# Patient Record
Sex: Male | Born: 1965
Health system: Southern US, Community
[De-identification: ages and names within clinical notes are randomized; demographics above are authoritative.]

## PROBLEM LIST (undated history)

## (undated) ENCOUNTER — Encounter

## (undated) ENCOUNTER — Encounter: Attending: Nephrology | Primary: Nephrology

## (undated) ENCOUNTER — Ambulatory Visit

## (undated) ENCOUNTER — Telehealth

## (undated) ENCOUNTER — Ambulatory Visit: Payer: MEDICARE

## (undated) ENCOUNTER — Encounter
Attending: Student in an Organized Health Care Education/Training Program | Primary: Student in an Organized Health Care Education/Training Program

## (undated) ENCOUNTER — Encounter: Attending: Surgery | Primary: Surgery

## (undated) ENCOUNTER — Ambulatory Visit: Attending: Nephrology | Primary: Nephrology

## (undated) ENCOUNTER — Encounter: Attending: Ambulatory Care | Primary: Ambulatory Care

## (undated) ENCOUNTER — Ambulatory Visit: Payer: MEDICARE | Attending: Surgery | Primary: Surgery

## (undated) ENCOUNTER — Ambulatory Visit: Payer: MEDICAID

## (undated) ENCOUNTER — Telehealth: Attending: Registered" | Primary: Registered"

## (undated) ENCOUNTER — Telehealth
Attending: Pharmacist Clinician (PhC)/ Clinical Pharmacy Specialist | Primary: Pharmacist Clinician (PhC)/ Clinical Pharmacy Specialist

## (undated) ENCOUNTER — Encounter: Payer: MEDICARE | Attending: Nephrology | Primary: Nephrology

## (undated) DIAGNOSIS — I10 Essential (primary) hypertension: Secondary | ICD-10-CM

## (undated) DIAGNOSIS — N186 End stage renal disease: Secondary | ICD-10-CM

## (undated) DIAGNOSIS — D649 Anemia, unspecified: Secondary | ICD-10-CM

## (undated) DIAGNOSIS — G4730 Sleep apnea, unspecified: Secondary | ICD-10-CM

## (undated) DIAGNOSIS — M1990 Unspecified osteoarthritis, unspecified site: Secondary | ICD-10-CM

## (undated) DIAGNOSIS — E119 Type 2 diabetes mellitus without complications: Secondary | ICD-10-CM

## (undated) DIAGNOSIS — R112 Nausea with vomiting, unspecified: Secondary | ICD-10-CM

## (undated) DIAGNOSIS — N189 Chronic kidney disease, unspecified: Secondary | ICD-10-CM

## (undated) DIAGNOSIS — N289 Disorder of kidney and ureter, unspecified: Secondary | ICD-10-CM

## (undated) DIAGNOSIS — Z9889 Other specified postprocedural states: Secondary | ICD-10-CM

## (undated) DIAGNOSIS — I2510 Atherosclerotic heart disease of native coronary artery without angina pectoris: Secondary | ICD-10-CM

## (undated) DIAGNOSIS — E079 Disorder of thyroid, unspecified: Secondary | ICD-10-CM

## (undated) DIAGNOSIS — C801 Malignant (primary) neoplasm, unspecified: Secondary | ICD-10-CM

## (undated) DIAGNOSIS — Z89519 Acquired absence of unspecified leg below knee: Secondary | ICD-10-CM

## (undated) DIAGNOSIS — N2889 Other specified disorders of kidney and ureter: Secondary | ICD-10-CM

## (undated) DIAGNOSIS — M86679 Other chronic osteomyelitis, unspecified ankle and foot: Secondary | ICD-10-CM

## (undated) DIAGNOSIS — E669 Obesity, unspecified: Secondary | ICD-10-CM

## (undated) DIAGNOSIS — E118 Type 2 diabetes mellitus with unspecified complications: Secondary | ICD-10-CM

## (undated) DIAGNOSIS — G629 Polyneuropathy, unspecified: Secondary | ICD-10-CM

## (undated) HISTORY — DX: Chronic kidney disease, unspecified: N18.9

## (undated) HISTORY — PX: FOOT SURGERY: SHX648

## (undated) HISTORY — PX: BELOW KNEE LEG AMPUTATION: SUR23

## (undated) HISTORY — PX: TONSILLECTOMY: SUR1361

## (undated) HISTORY — PX: EYE SURGERY: SHX253

## (undated) HISTORY — DX: Anemia, unspecified: D64.9

## (undated) HISTORY — DX: Sleep apnea, unspecified: G47.30

## (undated) HISTORY — DX: Other specified disorders of kidney and ureter: N28.89

## (undated) HISTORY — PX: CORONARY ANGIOPLASTY WITH STENT PLACEMENT: SHX49

## (undated) HISTORY — PX: JOINT REPLACEMENT: SHX530

## (undated) HISTORY — DX: Type 2 diabetes mellitus without complications: E11.9

---

## 2009-04-01 DIAGNOSIS — Z9889 Other specified postprocedural states: Secondary | ICD-10-CM

## 2009-04-01 HISTORY — DX: Other specified postprocedural states: Z98.890

## 2013-05-07 DIAGNOSIS — I2510 Atherosclerotic heart disease of native coronary artery without angina pectoris: Secondary | ICD-10-CM | POA: Insufficient documentation

## 2013-05-07 DIAGNOSIS — R9431 Abnormal electrocardiogram [ECG] [EKG]: Secondary | ICD-10-CM | POA: Insufficient documentation

## 2013-09-07 ENCOUNTER — Other Ambulatory Visit: Payer: Self-pay | Admitting: Podiatry

## 2013-09-10 DIAGNOSIS — J4531 Mild persistent asthma with (acute) exacerbation: Secondary | ICD-10-CM | POA: Insufficient documentation

## 2013-09-10 DIAGNOSIS — E11628 Type 2 diabetes mellitus with other skin complications: Secondary | ICD-10-CM | POA: Insufficient documentation

## 2013-09-10 DIAGNOSIS — E119 Type 2 diabetes mellitus without complications: Secondary | ICD-10-CM | POA: Insufficient documentation

## 2013-09-10 DIAGNOSIS — N184 Chronic kidney disease, stage 4 (severe): Secondary | ICD-10-CM | POA: Insufficient documentation

## 2013-09-10 DIAGNOSIS — L089 Local infection of the skin and subcutaneous tissue, unspecified: Secondary | ICD-10-CM | POA: Insufficient documentation

## 2013-09-14 DIAGNOSIS — D500 Iron deficiency anemia secondary to blood loss (chronic): Secondary | ICD-10-CM | POA: Insufficient documentation

## 2013-09-16 LAB — WOUND CULTURE

## 2013-10-28 DIAGNOSIS — J4540 Moderate persistent asthma, uncomplicated: Secondary | ICD-10-CM | POA: Insufficient documentation

## 2014-02-17 ENCOUNTER — Emergency Department: Payer: Self-pay | Admitting: Emergency Medicine

## 2014-02-17 ENCOUNTER — Ambulatory Visit: Payer: Self-pay | Admitting: Vascular Surgery

## 2014-02-17 LAB — BASIC METABOLIC PANEL
Anion Gap: 9 (ref 7–16)
Anion Gap: 10 (ref 7–16)
BUN: 78 mg/dL — ABNORMAL HIGH (ref 7–18)
BUN: 80 mg/dL — ABNORMAL HIGH (ref 7–18)
Calcium, Total: 8.8 mg/dL (ref 8.5–10.1)
Calcium, Total: 9 mg/dL (ref 8.5–10.1)
Chloride: 109 mmol/L — ABNORMAL HIGH (ref 98–107)
Chloride: 108 mmol/L — ABNORMAL HIGH (ref 98–107)
Co2: 25 mmol/L (ref 21–32)
Co2: 24 mmol/L (ref 21–32)
Creatinine: 4.79 mg/dL — ABNORMAL HIGH (ref 0.60–1.30)
Creatinine: 4.91 mg/dL — ABNORMAL HIGH (ref 0.60–1.30)
EGFR (African American): 17 — ABNORMAL LOW
EGFR (African American): 16 — ABNORMAL LOW
EGFR (Non-African Amer.): 14 — ABNORMAL LOW
EGFR (Non-African Amer.): 14 — ABNORMAL LOW
Glucose: 173 mg/dL — ABNORMAL HIGH (ref 65–99)
Glucose: 168 mg/dL — ABNORMAL HIGH (ref 65–99)
Osmolality: 312 (ref 275–301)
Osmolality: 311 (ref 275–301)
Potassium: 4.4 mmol/L (ref 3.5–5.1)
Potassium: 4.4 mmol/L (ref 3.5–5.1)
Sodium: 143 mmol/L (ref 136–145)
Sodium: 142 mmol/L (ref 136–145)

## 2014-02-17 LAB — CBC WITH DIFFERENTIAL/PLATELET
Basophil #: 0.1 10*3/uL (ref 0.0–0.1)
Basophil %: 0.7 %
Eosinophil #: 0.2 10*3/uL (ref 0.0–0.7)
Eosinophil %: 2 %
HCT: 34.1 % — ABNORMAL LOW (ref 40.0–52.0)
HGB: 10.5 g/dL — ABNORMAL LOW (ref 13.0–18.0)
Lymphocyte #: 1.6 10*3/uL (ref 1.0–3.6)
Lymphocyte %: 19 %
MCH: 24.1 pg — ABNORMAL LOW (ref 26.0–34.0)
MCHC: 30.9 g/dL — ABNORMAL LOW (ref 32.0–36.0)
MCV: 78 fL — ABNORMAL LOW (ref 80–100)
Monocyte #: 0.6 x10 3/mm (ref 0.2–1.0)
Monocyte %: 7.4 %
Neutrophil #: 5.8 10*3/uL (ref 1.4–6.5)
Neutrophil %: 70.9 %
Platelet: 185 10*3/uL (ref 150–440)
RBC: 4.38 10*6/uL — ABNORMAL LOW (ref 4.40–5.90)
RDW: 19.4 % — ABNORMAL HIGH (ref 11.5–14.5)
WBC: 8.2 10*3/uL (ref 3.8–10.6)

## 2014-02-17 LAB — CBC
HCT: 35.8 % — ABNORMAL LOW (ref 40.0–52.0)
HGB: 11.1 g/dL — ABNORMAL LOW (ref 13.0–18.0)
MCH: 24.1 pg — ABNORMAL LOW (ref 26.0–34.0)
MCHC: 31.1 g/dL — ABNORMAL LOW (ref 32.0–36.0)
MCV: 78 fL — ABNORMAL LOW (ref 80–100)
Platelet: 186 10*3/uL (ref 150–440)
RBC: 4.62 10*6/uL (ref 4.40–5.90)
RDW: 19.7 % — ABNORMAL HIGH (ref 11.5–14.5)
WBC: 8.6 10*3/uL (ref 3.8–10.6)

## 2014-02-17 LAB — TROPONIN I: Troponin-I: <0.02 ng/mL

## 2014-02-28 ENCOUNTER — Ambulatory Visit: Payer: Self-pay | Admitting: Internal Medicine

## 2014-03-04 ENCOUNTER — Ambulatory Visit: Payer: Self-pay | Admitting: Vascular Surgery

## 2014-03-23 DIAGNOSIS — Z7682 Awaiting organ transplant status: Secondary | ICD-10-CM | POA: Insufficient documentation

## 2014-03-23 DIAGNOSIS — E119 Type 2 diabetes mellitus without complications: Secondary | ICD-10-CM | POA: Insufficient documentation

## 2014-04-06 DIAGNOSIS — G4733 Obstructive sleep apnea (adult) (pediatric): Secondary | ICD-10-CM | POA: Insufficient documentation

## 2014-04-06 DIAGNOSIS — N2889 Other specified disorders of kidney and ureter: Secondary | ICD-10-CM | POA: Insufficient documentation

## 2014-04-27 ENCOUNTER — Ambulatory Visit: Payer: Self-pay | Admitting: Internal Medicine

## 2014-06-04 DIAGNOSIS — K644 Residual hemorrhoidal skin tags: Secondary | ICD-10-CM | POA: Insufficient documentation

## 2014-07-21 ENCOUNTER — Ambulatory Visit
Admit: 2014-07-21 | Disposition: A | Discharge status: Home / Self Care | Payer: Self-pay | Attending: Urology | Admitting: Urology

## 2014-07-23 NOTE — Op Note (Signed)
PATIENT NAME:  Tim Becker, Tim Becker MR#:  [id number] DATE OF BIRTH:  03-May-1965  DATE OF PROCEDURE:  03/04/2014  PREOPERATIVE DIAGNOSIS: Stage V renal insufficiency.   POSTOPERATIVE DIAGNOSIS: Stage V renal insufficiency.   PROCEDURE PERFORMED: Creation of a left wrist radiocephalic fistula.   SURGEON: Hortencia Pilar, MD  ANESTHESIA: General by LMA.   FLUIDS: Per anesthesia record.   ESTIMATED BLOOD LOSS: Minimal.   SPECIMEN: None.   INDICATIONS: Mr. Hoelzle is a 49 year old gentleman that will be initiating dialysis and therefore requires access. Risks and benefits were reviewed. All questions answered. The patient has agreed to proceed.   DESCRIPTION OF PROCEDURE: The patient is taken to the operating room and placed in the supine position. After adequate general anesthesia is induced and appropriate invasive monitors are placed, he is positioned supine with his left arm extended palm upward. The left arm is prepped and draped in a sterile fashion. Appropriate timeout is called.   A linear incision is created midway between the vein and the artery and carried down through the soft tissues. The vein is then exposed and dissected circumferentially. It is marked with a surgical marker.   Radial artery is then exposed and looped proximally and distally with Silastic vessel loops.   The cephalic vein is transected distally after tying with a 2-0 silk tie. It is then dilated with [facility] coronary dilators to a diameter of 4 mm. It is flushed with heparinized saline and hemostasis obtained with a bulldog. Radial artery is delivered into the surgical field, controlled with vascular clamps. Arteriotomy is made, extended with Potts scissors, and an end vein to side radial artery anastomosis is fashioned with running 6-0 Prolene. Flushing maneuvers are performed and flow is re-established first to the fistula and then distally to the hand. Excellent thrill is noted in the vein. Wound is then inspected for  hemostasis and closed with interrupted 3-0 Vicryl, followed by 4-0 Monocryl subcuticular and Dermabond. The patient tolerated the procedure well and there were no immediate complications.  ____________________________ Katha Cabal, MD ggs:sb D: 03/04/2014 11:21:59 ET T: 03/04/2014 11:27:48 ET JOB#: [id number]  cc: Katha Cabal, MD, <Dictator> Katha Cabal MD ELECTRONICALLY SIGNED 03/05/2014 16:40

## 2014-08-03 ENCOUNTER — Other Ambulatory Visit: Payer: Self-pay | Admitting: Nephrology

## 2014-08-03 ENCOUNTER — Ambulatory Visit
Admission: RE | Admit: 2014-08-03 | Discharge: 2014-08-03 | Disposition: A | Discharge status: Home / Self Care | Payer: Medicare Other | Source: Ambulatory Visit | Attending: Nephrology | Admitting: Nephrology

## 2014-08-03 DIAGNOSIS — R509 Fever, unspecified: Secondary | ICD-10-CM

## 2014-08-03 DIAGNOSIS — N185 Chronic kidney disease, stage 5: Secondary | ICD-10-CM

## 2014-08-03 DIAGNOSIS — N289 Disorder of kidney and ureter, unspecified: Secondary | ICD-10-CM | POA: Diagnosis present

## 2014-08-05 ENCOUNTER — Other Ambulatory Visit: Payer: Self-pay | Admitting: Urology

## 2014-08-05 DIAGNOSIS — N2889 Other specified disorders of kidney and ureter: Secondary | ICD-10-CM

## 2014-08-06 ENCOUNTER — Other Ambulatory Visit: Payer: Medicare Other

## 2014-08-06 ENCOUNTER — Emergency Department: Payer: Medicare Other

## 2014-08-06 ENCOUNTER — Encounter: Payer: Self-pay | Admitting: *Deleted

## 2014-08-06 ENCOUNTER — Inpatient Hospital Stay
Admission: EM | Admit: 2014-08-06 | Discharge: 2014-08-12 | DRG: 539 | Disposition: A | Discharge status: Home / Self Care | Payer: Medicare Other | Attending: Internal Medicine | Admitting: Internal Medicine

## 2014-08-06 DIAGNOSIS — N186 End stage renal disease: Secondary | ICD-10-CM | POA: Diagnosis present

## 2014-08-06 DIAGNOSIS — D72829 Elevated white blood cell count, unspecified: Secondary | ICD-10-CM | POA: Diagnosis present

## 2014-08-06 DIAGNOSIS — I10 Essential (primary) hypertension: Secondary | ICD-10-CM | POA: Diagnosis present

## 2014-08-06 DIAGNOSIS — R509 Fever, unspecified: Secondary | ICD-10-CM

## 2014-08-06 DIAGNOSIS — N2889 Other specified disorders of kidney and ureter: Secondary | ICD-10-CM | POA: Diagnosis present

## 2014-08-06 DIAGNOSIS — L02611 Cutaneous abscess of right foot: Secondary | ICD-10-CM | POA: Diagnosis present

## 2014-08-06 DIAGNOSIS — Z6839 Body mass index (BMI) 39.0-39.9, adult: Secondary | ICD-10-CM

## 2014-08-06 DIAGNOSIS — A5216 Charcot's arthropathy (tabetic): Secondary | ICD-10-CM | POA: Diagnosis present

## 2014-08-06 DIAGNOSIS — M869 Osteomyelitis, unspecified: Secondary | ICD-10-CM | POA: Diagnosis present

## 2014-08-06 DIAGNOSIS — E1142 Type 2 diabetes mellitus with diabetic polyneuropathy: Secondary | ICD-10-CM | POA: Diagnosis present

## 2014-08-06 DIAGNOSIS — G4733 Obstructive sleep apnea (adult) (pediatric): Secondary | ICD-10-CM | POA: Diagnosis present

## 2014-08-06 DIAGNOSIS — Z7982 Long term (current) use of aspirin: Secondary | ICD-10-CM

## 2014-08-06 DIAGNOSIS — E1140 Type 2 diabetes mellitus with diabetic neuropathy, unspecified: Secondary | ICD-10-CM | POA: Diagnosis present

## 2014-08-06 DIAGNOSIS — M86679 Other chronic osteomyelitis, unspecified ankle and foot: Secondary | ICD-10-CM | POA: Diagnosis present

## 2014-08-06 DIAGNOSIS — N19 Unspecified kidney failure: Secondary | ICD-10-CM

## 2014-08-06 DIAGNOSIS — E790 Hyperuricemia without signs of inflammatory arthritis and tophaceous disease: Secondary | ICD-10-CM | POA: Diagnosis present

## 2014-08-06 DIAGNOSIS — M86671 Other chronic osteomyelitis, right ankle and foot: Secondary | ICD-10-CM | POA: Diagnosis not present

## 2014-08-06 DIAGNOSIS — M86179 Other acute osteomyelitis, unspecified ankle and foot: Secondary | ICD-10-CM | POA: Diagnosis present

## 2014-08-06 DIAGNOSIS — I120 Hypertensive chronic kidney disease with stage 5 chronic kidney disease or end stage renal disease: Secondary | ICD-10-CM | POA: Diagnosis present

## 2014-08-06 DIAGNOSIS — Z794 Long term (current) use of insulin: Secondary | ICD-10-CM

## 2014-08-06 DIAGNOSIS — E669 Obesity, unspecified: Secondary | ICD-10-CM | POA: Diagnosis present

## 2014-08-06 DIAGNOSIS — I2510 Atherosclerotic heart disease of native coronary artery without angina pectoris: Secondary | ICD-10-CM | POA: Diagnosis present

## 2014-08-06 DIAGNOSIS — N179 Acute kidney failure, unspecified: Secondary | ICD-10-CM | POA: Diagnosis present

## 2014-08-06 DIAGNOSIS — Z792 Long term (current) use of antibiotics: Secondary | ICD-10-CM

## 2014-08-06 DIAGNOSIS — E039 Hypothyroidism, unspecified: Secondary | ICD-10-CM | POA: Diagnosis present

## 2014-08-06 DIAGNOSIS — Z95818 Presence of other cardiac implants and grafts: Secondary | ICD-10-CM

## 2014-08-06 HISTORY — DX: Type 2 diabetes mellitus with unspecified complications: E11.8

## 2014-08-06 HISTORY — DX: Disorder of thyroid, unspecified: E07.9

## 2014-08-06 HISTORY — DX: Atherosclerotic heart disease of native coronary artery without angina pectoris: I25.10

## 2014-08-06 HISTORY — DX: Other chronic osteomyelitis, unspecified ankle and foot: M86.679

## 2014-08-06 HISTORY — DX: End stage renal disease: N18.6

## 2014-08-06 LAB — URINALYSIS COMPLETE WITH MICROSCOPIC (ARMC ONLY)
Bacteria, UA: NONE SEEN
Bilirubin Urine: NEGATIVE
Glucose, UA: NEGATIVE mg/dL
Ketones, ur: NEGATIVE mg/dL
Leukocytes, UA: NEGATIVE
Nitrite: NEGATIVE
Protein, ur: 100 mg/dL — AB
Specific Gravity, Urine: 1.012 (ref 1.005–1.030)
Squamous Epithelial / LPF: NONE SEEN
pH: 5 (ref 5.0–8.0)

## 2014-08-06 LAB — CBC WITH DIFFERENTIAL/PLATELET
Basophils Absolute: 0.1 10*3/uL (ref 0–0.1)
Basophils Relative: 1 %
Eosinophils Absolute: 0.2 10*3/uL (ref 0–0.7)
Eosinophils Relative: 1 %
HCT: 26.7 % — ABNORMAL LOW (ref 40.0–52.0)
Hemoglobin: 8.4 g/dL — ABNORMAL LOW (ref 13.0–18.0)
Lymphocytes Relative: 9 %
Lymphs Abs: 1.1 10*3/uL (ref 1.0–3.6)
MCH: 25.1 pg — ABNORMAL LOW (ref 26.0–34.0)
MCHC: 31.3 g/dL — ABNORMAL LOW (ref 32.0–36.0)
MCV: 80.2 fL (ref 80.0–100.0)
Monocytes Absolute: 1.1 10*3/uL — ABNORMAL HIGH (ref 0.2–1.0)
Monocytes Relative: 8 %
Neutro Abs: 10.7 10*3/uL — ABNORMAL HIGH (ref 1.4–6.5)
Neutrophils Relative %: 81 %
Platelets: 393 10*3/uL (ref 150–440)
RBC: 3.33 MIL/uL — ABNORMAL LOW (ref 4.40–5.90)
RDW: 15.5 % — ABNORMAL HIGH (ref 11.5–14.5)
WBC: 13.2 10*3/uL — ABNORMAL HIGH (ref 3.8–10.6)

## 2014-08-06 NOTE — ED Notes (Signed)
Pt presents w/ c/o low grade fever x 8 days. Pt states today temp was 101.6, which has been the highest. Pt has been taking ibuprofen for fever, last taken ibuprofen 200 mg @ 2000. Pt has hx of multiple ortho surgeries on R foot and ankle, most recent was external fixation removed in February. Pt reinjured R ankle x 1 month ago. Pt also has ESRD and is preparing to go on dialysis.

## 2014-08-07 ENCOUNTER — Encounter: Payer: Self-pay | Admitting: Internal Medicine

## 2014-08-07 ENCOUNTER — Emergency Department: Payer: Medicare Other

## 2014-08-07 DIAGNOSIS — E790 Hyperuricemia without signs of inflammatory arthritis and tophaceous disease: Secondary | ICD-10-CM | POA: Diagnosis present

## 2014-08-07 DIAGNOSIS — N2889 Other specified disorders of kidney and ureter: Secondary | ICD-10-CM | POA: Diagnosis present

## 2014-08-07 DIAGNOSIS — E669 Obesity, unspecified: Secondary | ICD-10-CM | POA: Diagnosis present

## 2014-08-07 DIAGNOSIS — D72829 Elevated white blood cell count, unspecified: Secondary | ICD-10-CM | POA: Diagnosis present

## 2014-08-07 DIAGNOSIS — I10 Essential (primary) hypertension: Secondary | ICD-10-CM | POA: Diagnosis present

## 2014-08-07 DIAGNOSIS — Z792 Long term (current) use of antibiotics: Secondary | ICD-10-CM | POA: Diagnosis not present

## 2014-08-07 DIAGNOSIS — I2510 Atherosclerotic heart disease of native coronary artery without angina pectoris: Secondary | ICD-10-CM | POA: Diagnosis present

## 2014-08-07 DIAGNOSIS — M869 Osteomyelitis, unspecified: Secondary | ICD-10-CM | POA: Diagnosis present

## 2014-08-07 DIAGNOSIS — N179 Acute kidney failure, unspecified: Secondary | ICD-10-CM | POA: Diagnosis present

## 2014-08-07 DIAGNOSIS — Z6839 Body mass index (BMI) 39.0-39.9, adult: Secondary | ICD-10-CM | POA: Diagnosis not present

## 2014-08-07 DIAGNOSIS — A5216 Charcot's arthropathy (tabetic): Secondary | ICD-10-CM | POA: Diagnosis present

## 2014-08-07 DIAGNOSIS — G4733 Obstructive sleep apnea (adult) (pediatric): Secondary | ICD-10-CM | POA: Diagnosis present

## 2014-08-07 DIAGNOSIS — Z794 Long term (current) use of insulin: Secondary | ICD-10-CM | POA: Diagnosis not present

## 2014-08-07 DIAGNOSIS — I120 Hypertensive chronic kidney disease with stage 5 chronic kidney disease or end stage renal disease: Secondary | ICD-10-CM | POA: Diagnosis present

## 2014-08-07 DIAGNOSIS — E039 Hypothyroidism, unspecified: Secondary | ICD-10-CM | POA: Diagnosis present

## 2014-08-07 DIAGNOSIS — Z95818 Presence of other cardiac implants and grafts: Secondary | ICD-10-CM | POA: Diagnosis not present

## 2014-08-07 DIAGNOSIS — E1142 Type 2 diabetes mellitus with diabetic polyneuropathy: Secondary | ICD-10-CM | POA: Diagnosis present

## 2014-08-07 DIAGNOSIS — N186 End stage renal disease: Secondary | ICD-10-CM | POA: Diagnosis present

## 2014-08-07 DIAGNOSIS — E1140 Type 2 diabetes mellitus with diabetic neuropathy, unspecified: Secondary | ICD-10-CM | POA: Diagnosis present

## 2014-08-07 DIAGNOSIS — M86179 Other acute osteomyelitis, unspecified ankle and foot: Secondary | ICD-10-CM | POA: Diagnosis present

## 2014-08-07 DIAGNOSIS — L02611 Cutaneous abscess of right foot: Secondary | ICD-10-CM | POA: Diagnosis present

## 2014-08-07 DIAGNOSIS — M86671 Other chronic osteomyelitis, right ankle and foot: Secondary | ICD-10-CM | POA: Diagnosis present

## 2014-08-07 DIAGNOSIS — Z7982 Long term (current) use of aspirin: Secondary | ICD-10-CM | POA: Diagnosis not present

## 2014-08-07 LAB — CBC
HCT: 26.3 % — ABNORMAL LOW (ref 40.0–52.0)
Hemoglobin: 8.1 g/dL — ABNORMAL LOW (ref 13.0–18.0)
MCH: 24.9 pg — ABNORMAL LOW (ref 26.0–34.0)
MCHC: 30.8 g/dL — ABNORMAL LOW (ref 32.0–36.0)
MCV: 80.7 fL (ref 80.0–100.0)
Platelets: 394 10*3/uL (ref 150–440)
RBC: 3.26 MIL/uL — ABNORMAL LOW (ref 4.40–5.90)
RDW: 15.2 % — ABNORMAL HIGH (ref 11.5–14.5)
WBC: 12.4 10*3/uL — ABNORMAL HIGH (ref 3.8–10.6)

## 2014-08-07 LAB — GLUCOSE, CAPILLARY
Glucose-Capillary: 89 mg/dL (ref 70–99)
Glucose-Capillary: 151 mg/dL — ABNORMAL HIGH (ref 70–99)
Glucose-Capillary: 130 mg/dL — ABNORMAL HIGH (ref 70–99)
Glucose-Capillary: 135 mg/dL — ABNORMAL HIGH (ref 70–99)

## 2014-08-07 LAB — COMPREHENSIVE METABOLIC PANEL
ALT: 19 U/L (ref 17–63)
AST: 22 U/L (ref 15–41)
Albumin: 3.2 g/dL — ABNORMAL LOW (ref 3.5–5.0)
Alkaline Phosphatase: 114 U/L (ref 38–126)
Anion gap: 14 (ref 5–15)
BUN: 114 mg/dL — ABNORMAL HIGH (ref 6–20)
CO2: 23 mmol/L (ref 22–32)
Calcium: 8.5 mg/dL — ABNORMAL LOW (ref 8.9–10.3)
Chloride: 100 mmol/L — ABNORMAL LOW (ref 101–111)
Creatinine, Ser: 5.29 mg/dL — ABNORMAL HIGH (ref 0.61–1.24)
GFR calc Af Amer: 13 mL/min — ABNORMAL LOW (ref >60)
GFR calc non Af Amer: 12 mL/min — ABNORMAL LOW (ref >60)
Glucose, Bld: 127 mg/dL — ABNORMAL HIGH (ref 65–99)
Potassium: 4.7 mmol/L (ref 3.5–5.1)
Sodium: 137 mmol/L (ref 135–145)
Total Bilirubin: 0.4 mg/dL (ref 0.3–1.2)
Total Protein: 7.3 g/dL (ref 6.5–8.1)

## 2014-08-07 LAB — CREATININE, SERUM
Creatinine, Ser: 5.43 mg/dL — ABNORMAL HIGH (ref 0.61–1.24)
GFR calc Af Amer: 13 mL/min — ABNORMAL LOW (ref >60)
GFR calc non Af Amer: 11 mL/min — ABNORMAL LOW (ref >60)

## 2014-08-07 MED ORDER — VANCOMYCIN HCL 10 G IV SOLR
1250.0000 mg | INTRAVENOUS | Status: DC
  Administered 2014-08-08 – 2014-08-09 (×2): 1250 mg via INTRAVENOUS
  Filled 2014-08-07 (×3): qty 1250

## 2014-08-07 MED ORDER — INSULIN GLARGINE 100 UNIT/ML ~~LOC~~ SOLN
75.0000 [IU] | Freq: Every day | SUBCUTANEOUS | Status: DC
  Administered 2014-08-07 – 2014-08-12 (×6): 75 [IU] via SUBCUTANEOUS
  Filled 2014-08-07 (×7): qty 0.75

## 2014-08-07 MED ORDER — CARVEDILOL 25 MG PO TABS
25.0000 mg | ORAL_TABLET | Freq: Two times a day (BID) | ORAL | Status: DC
  Administered 2014-08-07 – 2014-08-12 (×11): 25 mg via ORAL
  Filled 2014-08-07 (×11): qty 1

## 2014-08-07 MED ORDER — OLMESARTAN MEDOXOMIL 20 MG PO TABS
40.0000 mg | ORAL_TABLET | Freq: Every day | ORAL | Status: DC
  Administered 2014-08-07 – 2014-08-12 (×6): 40 mg via ORAL
  Filled 2014-08-07 (×2): qty 2
  Filled 2014-08-07: qty 1
  Filled 2014-08-07 (×3): qty 2

## 2014-08-07 MED ORDER — SODIUM BICARBONATE 650 MG PO TABS
650.0000 mg | ORAL_TABLET | Freq: Two times a day (BID) | ORAL | Status: DC
  Administered 2014-08-07 – 2014-08-12 (×11): 650 mg via ORAL
  Filled 2014-08-07 (×13): qty 1

## 2014-08-07 MED ORDER — CEFAZOLIN SODIUM 1-5 GM-% IV SOLN
1.0000 g | Freq: Once | INTRAVENOUS | Status: AC
  Administered 2014-08-07: 1 g via INTRAVENOUS

## 2014-08-07 MED ORDER — HEPARIN SODIUM (PORCINE) 5000 UNIT/ML IJ SOLN
5000.0000 [IU] | Freq: Three times a day (TID) | INTRAMUSCULAR | Status: DC
  Administered 2014-08-07 – 2014-08-08 (×4): 5000 [IU] via SUBCUTANEOUS
  Filled 2014-08-07 (×5): qty 1

## 2014-08-07 MED ORDER — ONDANSETRON HCL 4 MG/2ML IJ SOLN: 4.0000 mg | Freq: Four times a day (QID) | INTRAMUSCULAR | Status: DC | PRN

## 2014-08-07 MED ORDER — LEVOTHYROXINE SODIUM 100 MCG PO TABS
100.0000 ug | ORAL_TABLET | Freq: Every day | ORAL | Status: DC
  Administered 2014-08-08 – 2014-08-12 (×5): 100 ug via ORAL
  Filled 2014-08-07 (×7): qty 1

## 2014-08-07 MED ORDER — INSULIN GLARGINE 100 UNIT/ML SOLOSTAR PEN
75.0000 [IU] | PEN_INJECTOR | Freq: Every day | SUBCUTANEOUS | Status: DC
  Filled 2014-08-07: qty 3

## 2014-08-07 MED ORDER — ASPIRIN 81 MG PO CHEW
81.0000 mg | CHEWABLE_TABLET | Freq: Every day | ORAL | Status: DC
  Administered 2014-08-07 – 2014-08-12 (×6): 81 mg via ORAL
  Filled 2014-08-07 (×6): qty 1

## 2014-08-07 MED ORDER — CEFAZOLIN SODIUM 1 G IJ SOLR: 1.0000 g | Freq: Once | INTRAMUSCULAR | Status: DC

## 2014-08-07 MED ORDER — CLONIDINE HCL 0.1 MG PO TABS
0.1000 mg | ORAL_TABLET | Freq: Two times a day (BID) | ORAL | Status: DC
  Administered 2014-08-07 – 2014-08-12 (×11): 0.1 mg via ORAL
  Filled 2014-08-07 (×11): qty 1

## 2014-08-07 MED ORDER — ONDANSETRON HCL 4 MG PO TABS: 4.0000 mg | ORAL_TABLET | Freq: Four times a day (QID) | ORAL | Status: DC | PRN

## 2014-08-07 MED ORDER — CEFAZOLIN SODIUM 1-5 GM-% IV SOLN
INTRAVENOUS | Status: AC
  Administered 2014-08-07: 1 g via INTRAVENOUS
  Filled 2014-08-07: qty 50

## 2014-08-07 MED ORDER — AMLODIPINE BESYLATE 10 MG PO TABS
10.0000 mg | ORAL_TABLET | Freq: Every day | ORAL | Status: DC
  Administered 2014-08-07 – 2014-08-12 (×6): 10 mg via ORAL
  Filled 2014-08-07 (×6): qty 1

## 2014-08-07 MED ORDER — ACETAMINOPHEN 650 MG RE SUPP: 650.0000 mg | Freq: Four times a day (QID) | RECTAL | Status: DC | PRN

## 2014-08-07 MED ORDER — OLMESARTAN-AMLODIPINE-HCTZ 40-10-25 MG PO TABS: 1.0000 | ORAL_TABLET | Freq: Every day | ORAL | Status: DC

## 2014-08-07 MED ORDER — HYDROCHLOROTHIAZIDE 25 MG PO TABS
25.0000 mg | ORAL_TABLET | Freq: Every day | ORAL | Status: DC
  Administered 2014-08-07 – 2014-08-12 (×6): 25 mg via ORAL
  Filled 2014-08-07 (×6): qty 1

## 2014-08-07 MED ORDER — FERROUS SULFATE 325 (65 FE) MG PO TABS
325.0000 mg | ORAL_TABLET | Freq: Every day | ORAL | Status: DC
  Administered 2014-08-07 – 2014-08-12 (×6): 325 mg via ORAL
  Filled 2014-08-07 (×6): qty 1

## 2014-08-07 MED ORDER — DOXYCYCLINE HYCLATE 100 MG PO TABS
100.0000 mg | ORAL_TABLET | Freq: Two times a day (BID) | ORAL | Status: DC
  Administered 2014-08-07: 100 mg via ORAL
  Filled 2014-08-07: qty 1

## 2014-08-07 MED ORDER — SODIUM CHLORIDE 0.9 % IV SOLN
1250.0000 mg | Freq: Once | INTRAVENOUS | Status: AC
  Administered 2014-08-07: 1250 mg via INTRAVENOUS
  Filled 2014-08-07: qty 1250

## 2014-08-07 MED ORDER — MORPHINE SULFATE 2 MG/ML IJ SOLN: 2.0000 mg | INTRAMUSCULAR | Status: DC | PRN

## 2014-08-07 MED ORDER — VANCOMYCIN HCL IN DEXTROSE 1-5 GM/200ML-% IV SOLN: 1000.0000 mg | INTRAVENOUS | Status: DC

## 2014-08-07 MED ORDER — INSULIN GLARGINE 100 UNIT/ML SOLOSTAR PEN: 75.0000 [IU] | PEN_INJECTOR | Freq: Every day | SUBCUTANEOUS | Status: DC

## 2014-08-07 MED ORDER — ALLOPURINOL 100 MG PO TABS
100.0000 mg | ORAL_TABLET | Freq: Every day | ORAL | Status: DC
  Administered 2014-08-07 – 2014-08-12 (×6): 100 mg via ORAL
  Filled 2014-08-07 (×6): qty 1

## 2014-08-07 MED ORDER — ACETAMINOPHEN 325 MG PO TABS: 650.0000 mg | ORAL_TABLET | Freq: Four times a day (QID) | ORAL | Status: DC | PRN

## 2014-08-07 NOTE — Progress Notes (Signed)
Patient pleasant and cooperative.  Brought CPAP from home and respiratory inspected and patient began using.  MRI of RLE and US renal scheduled.  Teley NSR.

## 2014-08-07 NOTE — ED Notes (Signed)
MD at bedside. 

## 2014-08-07 NOTE — Progress Notes (Signed)
Pt seen, examined; chart reviewed.  Pt with h/o DM, neuropathy, with multiple prior right ankle/foot surgeries, most of which were performed in [place].  Followed in [place] at [facility] (Dr. Mammie Russian?); had removal of hardware last January.  Reports hearing a pop with increased swelling about 2-3 weeks ago; developed fever over last 1 wk, over 101, similar to symptoms that he had with osteomyelitis in the foot in the past.  Had been on suppressive doxycycline. Has h/o ESRD; followed by nephrology, and reports was told he needed to start HD, but has been trying to put this off until June due to a graduation event.  Reports followed by Urology and was to have U/S soon for left renal mass.  Has MRI scheduled to further eval foot; watch for other sources of infection.  Consult podiatry and nephrology. Set up renal u/s for mass and for renal failure.  Stop doxy, given use of vancomycin.

## 2014-08-07 NOTE — ED Notes (Signed)
Patient transported to X-ray 

## 2014-08-07 NOTE — ED Provider Notes (Signed)
[facility] Emergency Department Provider Note  ____________________________________________  Time seen: 2:15 AM  I have reviewed the triage vital signs and the nursing notes.   HISTORY  Chief Complaint Fever  concerns for osteomyelitis    HPI Tim Becker is a 49 y.o. male who has a complicated history with osteomyelitis of his right ankle. He has had numerous surgeries on this ankle, this includes apparently 5 or 6 surgeries since last summer. He now presents to the emergency Department concerned because of low-grade fevers for the past 8 or 9 days. The temperature elevated even further today to 101.6. He does have a little bit of a cough. He expresses more concerned for the ankle itself which he says has not fused and feels kind of wobbly. This complicated surgical care was provided in [place].     Past Medical History  Diagnosis Date  . ESRD (end stage renal disease)   . Diabetes mellitus without complication   . Coronary artery disease   . Thyroid disease     There are no active problems to display for this patient.   Past Surgical History  Procedure Laterality Date  . Coronary angioplasty with stent placement    . Joint replacement      Current Outpatient Rx  Name  Route  Sig  Dispense  Refill  . allopurinol (ZYLOPRIM) 100 MG tablet   Oral   Take 1 tablet by mouth daily.      3   . aspirin 81 MG chewable tablet   Oral   Chew 1 tablet by mouth daily.         . carvedilol (COREG) 25 MG tablet   Oral   Take 1 tablet by mouth 2 (two) times daily.         . cloNIDine (CATAPRES) 0.1 MG tablet   Oral   Take 1 tablet by mouth 2 (two) times daily.      12   . doxycycline (VIBRA-TABS) 100 MG tablet   Oral   Take 1 tablet by mouth 2 (two) times daily.      0   . ferrous sulfate 325 (65 FE) MG tablet   Oral   Take 1 tablet by mouth daily.         . furosemide (LASIX) 40 MG tablet   Oral   Take 1 tablet by mouth  daily.         Marland Kitchen LANTUS SOLOSTAR 100 UNIT/ML Solostar Pen   Subcutaneous   Inject 75 Units into the skin daily.       3     Dispense as written.   Marland Kitchen levothyroxine (SYNTHROID, LEVOTHROID) 100 MCG tablet   Oral   Take 1 tablet by mouth daily.      3   . Omega-3 Fatty Acids (FISH OIL) 1000 MG CAPS   Oral   Take 2,000 mg by mouth daily.         . sodium bicarbonate 650 MG tablet   Oral   Take 1 tablet by mouth 2 (two) times daily.      11   . TRIBENZOR 40-10-25 MG TABS   Oral   Take 1 tablet by mouth daily.      3     Dispense as written.   . vitamin C (ASCORBIC ACID) 500 MG tablet   Oral   Take 1,500 mg by mouth daily.           Allergies Review of patient's allergies  indicates no known allergies.  History reviewed. No pertinent family history.  Social History History  Substance Use Topics  . Smoking status: Never Smoker   . Smokeless tobacco: Not on file  . Alcohol Use: No    Review of Systems Constitutional: Positive for low-grade fevers. Escalated fever today. See history of present illness  ENT: Negative for sore throat. Cardiovascular: Negative for chest pain. Respiratory: Negative for shortness of breath but positive for cough Gastrointestinal: Negative for abdominal pain, vomiting and diarrhea. Genitourinary: Negative for dysuria. Musculoskeletal: Complex right ankle issues please see history of present illness  Skin: Negative for rash. Neurological: Negative for headaches   10-point ROS otherwise negative.  ____________________________________________   PHYSICAL EXAM:  VITAL SIGNS: ED Triage Vitals  Enc Vitals Group     BP 08/06/14 2131 163/72 mmHg     Pulse Rate 08/06/14 2131 125     Resp 08/06/14 2131 22     Temp 08/06/14 2131 99.8 F (37.7 C)     Temp Source 08/06/14 2131 Oral     SpO2 08/06/14 2131 97 %     Weight 08/06/14 2131 258 lb (117.028 kg)     Height 08/06/14 2131 5\' 8"  (1.727 m)     Head Cir --      Peak Flow  --      Pain Score 08/06/14 2132 0     Pain Loc --      Pain Edu? --      Excl. in [place]? --     Constitutional: Alert and oriented. Well appearing and in no distress. ENT   Head: Normocephalic and atraumatic.   Nose: No congestion/rhinnorhea.   Mouth/Throat: Mucous membranes are moist. Cardiovascular: Normal rate, regular rhythm. Respiratory: Normal respiratory effort without tachypnea. Breath sounds are clear and equal bilaterally. No wheezes/rales/rhonchi. Gastrointestinal: Soft and nontender. No distention.  Back: There is no CVA tenderness. Musculoskeletal: Patient presents with a boot on the right foot. This was removed for examination. The skin and the area looks okay without any erythema. The joint itself does not appear normal. There is a great deal of laxity in the right ankle joint itself. There is no noted warmth in the area. There is no break in the skin. Neurologic:  Normal speech and language. No gross focal neurologic deficits are appreciated.  Skin:  Skin is warm, dry. No rash noted. See musculoskeletal exam above for additional pertinent negatives. Psychiatric: Mood and affect are normal. Speech and behavior are normal.  ____________________________________________     LABS (pertinent positives/negatives)  White blood cell count 13.2 hemoglobin of 8.4. Patient does have a history of end-stage renal disease, not yet on dialysis, he has a BUS of 114 and a creatinine of 5.29 with potassium of 4.7  ____________________________________________   EKG  EKG at 2204: Sinus tachycardia at 108. Normal intervals and normal axis.  ____________________________________________    RADIOLOGY chest x-ray without acute changes.  Right ankle x-ray severe chronic changes about the right hindfoot without notable acute findings.  ____________________________________________   PROCEDURES  Procedure(s) performed: None  Critical Care performed:  No  ____________________________________________   INITIAL IMPRESSION / ASSESSMENT AND PLAN / ED COURSE  We are not finding a source for the fever of this patient other than the suspected area of his right ankle. While we do not see acute changes on the complicated right ankle I am treating the patient with antibiotics currently. He does remain on doxycycline for suppression long-term.  Given his complex history  with both diabetes, kidney dysfunction, history of osteomyelitis, and fever, I think it best for the patient be hospitalized for ongoing care. The limitation with this is that his surgeon is in [place] and no doubt it is an best interest for him to be treated by his surgeon for the orthopedic aspects of this case. We will speak with the hospitalist to admit here at [facility] with the intention of possibly transferring him if needed for further orthopedic attention.  ____________________________________________   FINAL CLINICAL IMPRESSION(S) / ED DIAGNOSES  Final diagnoses:  Other specified fever  Osteomyelitis  End stage renal disease      Ahmed Prima, MD 08/07/14 [phone]

## 2014-08-07 NOTE — Progress Notes (Signed)
ANTIBIOTIC CONSULT NOTE - INITIAL  Pharmacy Consult for vancomycin dosing Indication: osteomyelitis  No Known Allergies  Patient Measurements: Height: 5\' 8"  (172.7 cm) Weight: 258 lb (117.028 kg) IBW/kg (Calculated) : 68.4 Adjusted Body Weight: 88kg  Vital Signs: Temp: 98.8 F (37.1 C) (05/08 0626) Temp Source: Oral (05/08 0626) BP: 153/78 mmHg (05/08 0626) Pulse Rate: 106 (05/08 0626) Intake/Output from previous day:   Intake/Output from this shift:    Labs:  Recent Labs  08/06/14 2152 08/07/14 0647  WBC 13.2* 12.4*  HGB 8.4* 8.1*  PLT 393 394  CREATININE 5.29*  --    Estimated Creatinine Clearance: 21 mL/min (by C-G formula based on Cr of 5.29). No results for input(s): VANCOTROUGH, VANCOPEAK, VANCORANDOM, GENTTROUGH, GENTPEAK, GENTRANDOM, TOBRATROUGH, TOBRAPEAK, TOBRARND, AMIKACINPEAK, AMIKACINTROU, AMIKACIN in the last 72 hours.   Microbiology: No results found for this or any previous visit (from the past 720 hour(s)).  Medical History: Past Medical History  Diagnosis Date  . ESRD (end stage renal disease)   . Diabetes mellitus without complication   . Coronary artery disease   . Thyroid disease     Medications:   Assessment:   Goal of Therapy:  Vancomycin trough level 15-20 mcg/ml  Plan:  UA: LE(-) NO2(-) WBC 0-5 Blood and urine cx pending Kei 0.022 hr-1  t1/2 32 hours TBW 117kg  IBW 68.4kg  DW 88kg  Vd 62L 1250 mg x1 and then q 36 hours after stacked dose. Level ordered for 22:00, 5/11 before 4th dose. Goal trough 15-20 for osteomyelitis.  Johanna Matto S 08/07/2014,7:49 AM

## 2014-08-07 NOTE — ED Notes (Signed)
Dr. Lavetta Nielsen in to see pt.

## 2014-08-07 NOTE — ED Notes (Signed)
Report from laurie, rn. Pt sleeping, resps unlabored.

## 2014-08-07 NOTE — H&P (Signed)
[employer] [facility] NAME: Tim Becker    MR#:  [id number]  DATE OF BIRTH:  [birth year]   DATE OF ADMISSION:  08/06/2014  PRIMARY CARE PHYSICIAN: Glendon Axe, MD   REQUESTING/REFERRING PHYSICIAN: Thomasene Lot  CHIEF COMPLAINT:   Chief Complaint  Patient presents with  . Fever    Pt is wearing an ortho device on R foot. Pt has ESRD.    HISTORY OF PRESENT ILLNESS:  Tim Becker  is a 49 y.o. male with a known history of type 2 diabetes complicated by peripheral neuropathy, chronic osteomyelitis right ankle presenting with fever. Vision describes having fever for approximately 8 days total duration MAXIMUM TEMPERATURE 101.6. He also complains of right ankle swelling and warmth. No further symptomatology, denies any ankle pain.  PAST MEDICAL HISTORY:   Past Medical History  Diagnosis Date  . ESRD (end stage renal disease)   . Diabetes mellitus without complication   . Coronary artery disease   . Thyroid disease     PAST SURGICAL HISTORY:   Past Surgical History  Procedure Laterality Date  . Coronary angioplasty with stent placement    . Joint replacement      SOCIAL HISTORY:   History  Substance Use Topics  . Smoking status: Never Smoker   . Smokeless tobacco: Not on file  . Alcohol Use: No    FAMILY HISTORY:   Family History  Problem Relation Age of Onset  . Diabetes Mother     DRUG ALLERGIES:  No Known Allergies  REVIEW OF SYSTEMS:  REVIEW OF SYSTEMS:  CONSTITUTIONAL: Positive for fevers, chills, fatigue, weakness.  EYES: Denies blurred vision, double vision, or eye pain.  EARS, NOSE, THROAT: Denies tinnitus, ear pain, hearing loss.  RESPIRATORY: denies cough, shortness of breath, wheezing  CARDIOVASCULAR: Denies chest pain, palpitations, edema.  GASTROINTESTINAL: Denies nausea, vomiting, diarrhea, abdominal pain.  GENITOURINARY: Denies dysuria, hematuria.  ENDOCRINE: Denies nocturia or thyroid  problems. HEMATOLOGIC AND LYMPHATIC: Denies easy bruising or bleeding.  SKIN: Denies rash or lesions.  MUSCULOSKELETAL: Denies pain in neck, back, shoulder, knees, hips, or further arthritic symptoms.  NEUROLOGIC: Denies paralysis, paresthesias. Decreased sensation bilateral lower extremity (chronic) PSYCHIATRIC: Denies anxiety or depressive symptoms. Otherwise full review of systems performed by me is negative.   MEDICATIONS AT HOME:   Prior to Admission medications   Medication Sig Start Date End Date Taking? Authorizing Provider  allopurinol (ZYLOPRIM) 100 MG tablet Take 1 tablet by mouth daily. 05/19/14  Yes Historical Provider, MD  aspirin 81 MG chewable tablet Chew 1 tablet by mouth daily.   Yes Historical Provider, MD  carvedilol (COREG) 25 MG tablet Take 1 tablet by mouth 2 (two) times daily.   Yes Historical Provider, MD  cloNIDine (CATAPRES) 0.1 MG tablet Take 1 tablet by mouth 2 (two) times daily. 06/19/14  Yes Historical Provider, MD  doxycycline (VIBRA-TABS) 100 MG tablet Take 1 tablet by mouth 2 (two) times daily. 07/11/14  Yes Historical Provider, MD  ferrous sulfate 325 (65 FE) MG tablet Take 1 tablet by mouth daily. 09/16/13 09/16/14 Yes Historical Provider, MD  furosemide (LASIX) 40 MG tablet Take 1 tablet by mouth daily. 08/03/14  Yes Historical Provider, MD  LANTUS SOLOSTAR 100 UNIT/ML Solostar Pen Inject 75 Units into the skin daily.  07/12/14  Yes Historical Provider, MD  levothyroxine (SYNTHROID, LEVOTHROID) 100 MCG tablet Take 1 tablet by mouth daily. 07/22/14  Yes Historical Provider, MD  Omega-3 Fatty Acids (FISH OIL) 1000  MG CAPS Take 2,000 mg by mouth daily.   Yes Historical Provider, MD  sodium bicarbonate 650 MG tablet Take 1 tablet by mouth 2 (two) times daily. 07/06/14  Yes Historical Provider, MD  TRIBENZOR 40-10-25 MG TABS Take 1 tablet by mouth daily. 05/18/14  Yes Historical Provider, MD  vitamin C (ASCORBIC ACID) 500 MG tablet Take 1,500 mg by mouth daily.   Yes  Historical Provider, MD      VITAL SIGNS:  Blood pressure 136/66, pulse 96, temperature 99.2 F (37.3 C), temperature source Oral, resp. rate 19, height 5\' 8"  (1.727 m), weight 258 lb (117.028 kg), SpO2 97 %.  PHYSICAL EXAMINATION:  VITAL SIGNS: Filed Vitals:   08/07/14 0200  BP: 136/66  Pulse: 96  Temp:   Resp: 19   GENERAL:49 y.o.male currently in no acute distress.  HEAD: Normocephalic, atraumatic.  EYES: Pupils equal, round, reactive to light. Extraocular muscles intact. No scleral icterus.  MOUTH: Moist mucosal membrane. Dentition intact. No abscess noted.  EAR, NOSE, THROAT: Clear without exudates. No external lesions.  NECK: Supple. No thyromegaly. No nodules. No JVD.  PULMONARY: Clear to ascultation, without wheeze rails or rhonci. No use of accessory muscles, Good respiratory effort. good air entry bilaterally CHEST: Nontender to palpation.  CARDIOVASCULAR: S1 and S2. Regular rate and rhythm. No murmurs, rubs, or gallops. No edema. Pedal pulses 2+ bilaterally.  GASTROINTESTINAL: Soft, nontender, nondistended. No masses. Positive bowel sounds. No hepatosplenomegaly.  MUSCULOSKELETAL: Right ankle swelling with associated warmth medial malleolus, no further clubbing, or edema. Range of motion limited right foot giving gross deformities NEUROLOGIC: Cranial nerves II through XII are intact. No gross focal neurological deficits. Sensation diminished peripherally. Reflexes intact.  SKIN: No ulceration, lesions, rashes, or cyanosis. Skin warm and dry. Turgor intact.  PSYCHIATRIC: Mood, affect within normal limits. The patient is awake, alert and oriented x 3. Insight, judgment intact.    LABORATORY PANEL:   CBC  Recent Labs Lab 08/06/14 2152  WBC 13.2*  HGB 8.4*  HCT 26.7*  PLT 393   ------------------------------------------------------------------------------------------------------------------  Chemistries   Recent Labs Lab 08/06/14 2152  NA 137  K 4.7  CL  100*  CO2 23  GLUCOSE 127*  BUN 114*  CREATININE 5.29*  CALCIUM 8.5*  AST 22  ALT 19  ALKPHOS 114  BILITOT 0.4   ------------------------------------------------------------------------------------------------------------------  Cardiac Enzymes No results for input(s): TROPONINI in the last 168 hours. ------------------------------------------------------------------------------------------------------------------  RADIOLOGY:  Dg Chest 2 View  08/06/2014   CLINICAL DATA:  Cough and fever, 1 week duration. No pain or dyspnea.  EXAM: CHEST  2 VIEW  COMPARISON:  08/03/2014  FINDINGS: There is unchanged mild cardiomegaly. The lungs are clear. The pulmonary vasculature is normal. There are no pleural effusions. There are prominent upper mediastinal contours which likely are due to fat ; this is unchanged from 08/03/2014 and from 02/17/2014.  IMPRESSION: Mild cardiomegaly.  No acute cardiopulmonary findings.   Electronically Signed   By: Andreas Newport M.D.   On: 08/06/2014 23:12   Dg Ankle Complete Right  08/07/2014   CLINICAL DATA:  Multiple right foot and ankle surgeries. Repeat injury last month.  EXAM: RIGHT ANKLE - COMPLETE 3+ VIEW  COMPARISON:  None.  FINDINGS: There is prior resection of the distal fibula. There are old tibial diaphyseal screw fragments. 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 Multiple bone fragments are scattered about the distal tibia, and there also is soft  tissue calcification around the ankle. There is sclerosis and collapse of the calcaneus in its mid and anterior portions. There is a milder degree of sclerosis and irregularity of the cuboid and navicular.  No acute fracture is evident.  IMPRESSION: Severe chronic changes about the right hindfoot as detailed above. No convincing acute finding is evident.   Electronically Signed   By: Andreas Newport M.D.   On: 08/07/2014 02:57     EKG:  No orders found for this or any previous visit.  IMPRESSION AND PLAN:   49 year old African gentleman history of type 2 diabetes insulin requiring, And neuropathy presenting with 8 day duration fever.  1. Osteomyelitis,  right ankle: He is on suppressive doxycycline chronically however given increased edema, warmth, fever, leukocytosis will increase anaerobic coverage to vancomycin. Check an MRI of right ankle. 2. Type 2 diabetes insulin requiring, complicated by neuropathy: Continue Lantus and insulin sliding scale every 6 hours Accu-Cheks 3. Hypertension essential: Continue Catapres Coreg 4. Hypothyroidism unspecified: Continue Synthroid 5. Acute kidney injury on chronic kidney disease: Hold diuresis, follow urine output/renal function 6.Venous thromboembolism prophylactic: Heparin subcutaneous     All the records are reviewed and case discussed with ED provider. Management plans discussed with the patient, family and they are in agreement.  CODE STATUS: Full  TOTAL TIME TAKING CARE OF THIS PATIENT: 45 minutes.    Ifeoluwa Bartz,  Karenann Cai.D on 08/07/2014 at 4:26 AM  Between 7am to 6pm - Pager - [phone]  After 6pm: House Pager: - [phone]  [facility]  Office  [phone]  CC: Primary care physician; Glendon Axe, MD

## 2014-08-08 ENCOUNTER — Inpatient Hospital Stay: Payer: Medicare Other

## 2014-08-08 ENCOUNTER — Ambulatory Visit: Payer: Medicare Other

## 2014-08-08 ENCOUNTER — Other Ambulatory Visit: Payer: Self-pay | Admitting: Podiatry

## 2014-08-08 DIAGNOSIS — E1169 Type 2 diabetes mellitus with other specified complication: Secondary | ICD-10-CM

## 2014-08-08 DIAGNOSIS — M869 Osteomyelitis, unspecified: Secondary | ICD-10-CM

## 2014-08-08 LAB — GLUCOSE, CAPILLARY
Glucose-Capillary: 97 mg/dL (ref 70–99)
Glucose-Capillary: 154 mg/dL — ABNORMAL HIGH (ref 70–99)
Glucose-Capillary: 229 mg/dL — ABNORMAL HIGH (ref 70–99)
Glucose-Capillary: 214 mg/dL — ABNORMAL HIGH (ref 70–99)

## 2014-08-08 LAB — URINE CULTURE

## 2014-08-08 MED ORDER — HEPARIN SODIUM (PORCINE) 5000 UNIT/ML IJ SOLN
5000.0000 [IU] | Freq: Three times a day (TID) | INTRAMUSCULAR | Status: DC
  Administered 2014-08-09 – 2014-08-11 (×8): 5000 [IU] via SUBCUTANEOUS
  Filled 2014-08-08 (×8): qty 1

## 2014-08-08 NOTE — Care Management Note (Signed)
Case Management Note  Patient Details  Name: Dinari Huseby MRN: [id number] Date of Birth: [birth year]  Subjective/Objective:                  Patient resting comfortably in bed with "boot' to right lower extremity. He has not re-established with PCP but would like to remain with Dr. Candiss Norse nephrology. Patient states he has not been established with outpatient dialysis. He states he is waiting on his daughter to graduate before start dialysis. Plans to move to [facility] with his new wife soon. Denies need for ambulatory DME arrangement. States he and his wife drive.   Action/Plan: No RNCM needs at this time; will continue to follow for potential outpatient IV ABX needs.   Expected Discharge Date:                  Expected Discharge Plan:     In-House Referral:     Discharge planning Services  CM Consult  Post Acute Care Choice:    Choice offered to:  Patient  DME Arranged:  N/A DME Agency:     HH Arranged:  NA HH Agency:     Status of Service:  Completed, signed off  Medicare Important Message Given:  Yes Date Medicare IM Given:  08/01/14 Medicare IM give by:  Marshell Garfinkel Date Additional Medicare IM Given:    Additional Medicare Important Message give by:     If discussed at [place] of Stay Meetings, dates discussed:    Additional Comments:  Marshell Garfinkel, RN 08/08/2014, 10:50 AM

## 2014-08-08 NOTE — Progress Notes (Signed)
Chaplain meet with patient and spouse, chaplain provided pastoral care and counseling, empathic listening, acknowledging patient and spouses concerns.  Tim Becker Monday 08-08-2014   08/08/14 1600  Clinical Encounter Type  Visited With Patient and family together  Visit Type Initial  Referral From Chaplain  Consult/Referral To [place] (Comment) (Patient was comforted speaking with Chaplain)  Stress Factors  Patient Stress Factors Health changes  Family Stress Factors Health changes

## 2014-08-08 NOTE — Progress Notes (Signed)
Subjective: No c/o pain. He ambulating in the room with right boot. No SOB, chest pain or abdominal pain.   Objective: Vital signs in last 24 hours: Temp:  [98 F (36.7 C)-99.2 F (37.3 C)] 98.6 F (37 C) (05/09 1201) Pulse Rate:  [68-81] 81 (05/09 1201) Resp:  [18] 18 (05/09 1201) BP: (123-138)/(59-69) 138/69 mmHg (05/09 1201) SpO2:  [95 %-100 %] 100 % (05/09 1201) Weight change:     Intake/Output from previous day: 05/08 0701 - 05/09 0700 In: 720 [P.O.:720] Out: 900 [Urine:900] Intake/Output this shift: Total I/O In: 600 [P.O.:600] Out: -   General appearance: alert, cooperative and no distress Head: Normocephalic, without obvious abnormality, atraumatic Neck: no JVD, supple, symmetrical, trachea midline and thyroid not enlarged Resp: clear to auscultation bilaterally Cardio: regular rate and rhythm, S1, S2 normal, no murmur, rub or gallop GI: soft, non-tender; no masses,  no organomegaly Extremities: right leg in special boot, no edema of left lower leg  Neurologic: Alert and oriented X 3, moving all extremities.  Lab Results:  Recent Labs  08/06/14 2152 08/07/14 0647  WBC 13.2* 12.4*  HGB 8.4* 8.1*  HCT 26.7* 26.3*  PLT 393 394   BMET  Recent Labs  08/06/14 2152 08/07/14 0647  NA 137  --   K 4.7  --   CL 100*  --   CO2 23  --   GLUCOSE 127*  --   BUN 114*  --   CREATININE 5.29* 5.43*  CALCIUM 8.5*  --     Studies/Results: Dg Chest 2 View  08/06/2014   CLINICAL DATA:  Cough and fever, 1 week duration. No pain or dyspnea.  EXAM: CHEST  2 VIEW  COMPARISON:  08/03/2014  FINDINGS: There is unchanged mild cardiomegaly. The lungs are clear. The pulmonary vasculature is normal. There are no pleural effusions. There are prominent upper mediastinal contours which likely are due to fat ; this is unchanged from 08/03/2014 and from 02/17/2014.  IMPRESSION: Mild cardiomegaly.  No acute cardiopulmonary findings.   Electronically Signed   By: Andreas Newport M.D.    On: 08/06/2014 23:12   Dg Ankle Complete Right  08/07/2014   CLINICAL DATA:  Multiple right foot and ankle surgeries. Repeat injury last month.  EXAM: RIGHT ANKLE - COMPLETE 3+ VIEW  COMPARISON:  None.  FINDINGS: There is prior resection of the distal fibula. There are old tibial diaphyseal screw fragments. 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 Multiple bone fragments are scattered about the distal tibia, and there also is soft tissue calcification around the ankle. There is sclerosis and collapse of the calcaneus in its mid and anterior portions. There is a milder degree of sclerosis and irregularity of the cuboid and navicular.  No acute fracture is evident.  IMPRESSION: Severe chronic changes about the right hindfoot as detailed above. No convincing acute finding is evident.   Electronically Signed   By: Andreas Newport M.D.   On: 08/07/2014 02:57   US Renal  08/08/2014   CLINICAL DATA:  Renal failure.  Followup left renal mass.  EXAM: RENAL / URINARY TRACT ULTRASOUND COMPLETE  COMPARISON:  CT, 07/21/2014.  FINDINGS: Right Kidney:  Length: 12.2 cm. Borderline increased parenchymal echogenicity. No mass, stone or hydronephrosis.  Left Kidney:  Length: 11.4 cm. Normal parenchymal echogenicity. Mass with internal echoes but no convincing internal blood flow protrudes from the midpole of the left kidney measuring 3.3 cm x  2.9 cm x 3.1 cm. No hydronephrosis.  Bladder:  Appears normal for degree of bladder distention.  IMPRESSION: 1. No hydronephrosis.  No acute finding. 2. 3.3 cm left midpole exophytic renal mass. This could be a solid mass although no convincing blood flow was seen within this. Is more likely a complicated left renal cyst. Recommend either short-term followup with repeat ultrasound in 3-4 months to document stability or further characterization with renal MRI with and without contrast.   Electronically Signed    By: Lajean Manes M.D.   On: 08/08/2014 10:44   Mr Ankle Right  Wo Contrast  08/08/2014   CLINICAL DATA:  Increasing right ankle swelling and pain for 2 or 3 weeks. History of osteomyelitis with multiple prior surgeries. No recent injury. Initial encounter.  EXAM: MRI OF THE RIGHT ANKLE WITHOUT CONTRAST  TECHNIQUE: Multiplanar, multisequence MR imaging of the ankle was performed. No intravenous contrast was administered.  COMPARISON:  Radiographs 08/07/2014.  FINDINGS: The study is markedly abnormal. Patient is status post resection of the distal fibula and tibia. There is near complete absence of the talus and navicular with large erosions involving the superior aspect of the calcaneal body. There is a large complex fluid collection surrounding the distal tibia, measuring 7.6 x 8.2 x 8.0 cm. There are multiple foci of susceptibility artifact within this complex fluid collection, likely postsurgical. In correlation with the radiographs, there are multiple associated calcifications along the periphery of this fluid collection. There is a 1.8 cm calcific density medially.  TENDONS  Peroneal: Poorly visualized distally on the axial and coronal images. The tendons may be intact based on the sagittal images.  Posteromedial: The posterior tibialis tendon appears attenuated distally, but grossly intact. The other medial flexor tendons appear intact.  Anterior: Intact.  Achilles: Intact with mild diffuse tendinosis.  Plantar Fascia: Intact.  LIGAMENTS  Lateral: Not applicable given previous surgery.  Medial: Not applicable given previous surgery.  CARTILAGE  Ankle Joint: Not applicable given previous surgery and findings above.  Subtalar Joints/Sinus Tarsi: Not applicable given previous surgery and findings above.  Bones: Postsurgical findings are discussed above. There are large irregular erosions involving the superior aspect of the calcaneal body adjacent to the fluid collection. Screw defects are present within the  calcaneus and distal tibia. There is retained hardware within the distal tibia as correlated with prior radiographs. There is mild fragmentation of the cuboid and cuneiform bones which appears longstanding without associated marrow edema.  IMPRESSION: 1. Extensive abnormalities as described above, likely due to a combination of postsurgical change and chronic osteomyelitis. 2. Large complex fluid collection surrounding the resected distal tibia, potentially infected. Fluid sampling recommended. 3. Chronic osteomyelitis within the distal tibia is likely. No specific evidence of acute osteomyelitis within the visualized foot.   Electronically Signed   By: Richardean Sale M.D.   On: 08/08/2014 12:48    Medications:  Scheduled: . allopurinol  100 mg Oral Daily  . olmesartan  40 mg Oral Daily   And  . amLODipine  10 mg Oral Daily   And  . hydrochlorothiazide  25 mg Oral Daily  . aspirin  81 mg Oral Daily  . carvedilol  25 mg Oral BID  . cloNIDine  0.1 mg Oral BID  . ferrous sulfate  325 mg Oral Daily  . heparin  5,000 Units Subcutaneous 3 times per day  . insulin glargine  75 Units Subcutaneous Daily  . levothyroxine  100 mcg Oral QAC breakfast  .  sodium bicarbonate  650 mg Oral BID  . vancomycin  1,250 mg Intravenous Q36H    Assessment/Plan: Chronic osteomyelitis of right ankle and post operative changes noted on MRI. Podiatry input appreciated. Fever and leukocytosis, continue Vancomycin, ID consult.  Left renal mass: noted on renal u/s, no hydronephrosis. Patient will need MRI with contrast and follow up with his urologist.  ESRD; patient is trying to defer dialysis, closely followed by nephrology.    LOS: 1 day   Tim Becker 08/08/2014, 3:39 PM

## 2014-08-08 NOTE — Progress Notes (Signed)
Subjective:   Patient known to our practice from outpatient. He is followed for CKD st 5 admitted for evaluation of fever. Found to have fluid collection / abscess in rt foor Outpatient U.A and CXR were clear BUN/CR are critically high    Objective:  Vital signs in last 24 hours:  Temp:  [98 F (36.7 C)-98.7 F (37.1 C)] 98.7 F (37.1 C) (05/09 2014) Pulse Rate:  [68-81] 80 (05/09 2014) Resp:  [18] 18 (05/09 2014) BP: (118-138)/(58-69) 128/63 mmHg (05/09 2014) SpO2:  [96 %-100 %] 99 % (05/09 2014)  Weight change:  Filed Weights   08/06/14 2131  Weight: 117.028 kg (258 lb)    Intake/Output: I/O last 3 completed shifts: In: 1560 [P.O.:1560] Out: [id number] [Urine:1650]   Intake/Output this shift:     Physical Exam: General: NAD,   Head: Normocephalic, atraumatic. Moist oral mucosal membranes  Eyes: Anicteric, PERRL  Neck: Supple, trachea midline  Lungs:  Clear to auscultation  Heart: Regular rate and rhythm  Abdomen:  Soft, nontender,   Extremities:  + peripheral edema.  Neurologic: Nonfocal, moving all four extremities  Skin: No lesions  Access: AVF    Basic Metabolic Panel:  Recent Labs Lab 08/06/14 2152 08/07/14 0647  NA 137  --   K 4.7  --   CL 100*  --   CO2 23  --   GLUCOSE 127*  --   BUN 114*  --   CREATININE 5.29* 5.43*  CALCIUM 8.5*  --     Liver Function Tests:  Recent Labs Lab 08/06/14 2152  AST 22  ALT 19  ALKPHOS 114  BILITOT 0.4  PROT 7.3  ALBUMIN 3.2*   No results for input(s): LIPASE, AMYLASE in the last 168 hours. No results for input(s): AMMONIA in the last 168 hours.  CBC:  Recent Labs Lab 08/06/14 2152 08/07/14 0647  WBC 13.2* 12.4*  NEUTROABS 10.7*  --   HGB 8.4* 8.1*  HCT 26.7* 26.3*  MCV 80.2 80.7  PLT 393 394    Cardiac Enzymes: No results for input(s): CKTOTAL, CKMB, CKMBINDEX, TROPONINI in the last 168 hours.  BNP: Invalid input(s): POCBNP  CBG:  Recent Labs Lab 08/07/14 2107 08/08/14 0743  08/08/14 1154 08/08/14 1641 08/08/14 2110  GLUCAP 135* 97 154* 229* 50*    Microbiology: Results for orders placed or performed during the hospital encounter of 08/06/14  Culture, blood (routine x 2)     Status: None (Preliminary result)   Collection Time: 08/06/14  9:52 PM  Result Value Ref Range Status   Specimen Description BLOOD  Final   Special Requests NONE  Final   Culture NO GROWTH 2 DAYS  Final   Report Status PENDING  Incomplete  Urine culture     Status: None   Collection Time: 08/06/14  9:55 PM  Result Value Ref Range Status   Specimen Description URINE, CLEAN CATCH  Final   Special Requests NONE  Final   Culture   Final    MULTIPLE SPECIES PRESENT, SUGGEST RECOLLECTION IF CLINICALLY INDICATED   Report Status 08/08/2014 FINAL  Final  Culture, blood (routine x 2)     Status: None (Preliminary result)   Collection Time: 08/07/14  2:53 AM  Result Value Ref Range Status   Specimen Description BLOOD  Final   Special Requests NONE  Final   Culture NO GROWTH 1 DAY  Final   Report Status PENDING  Incomplete    Coagulation Studies: No results for input(s): LABPROT, INR  in the last 72 hours.  Urinalysis:  Recent Labs  08/06/14 2155  COLORURINE YELLOW*  LABSPEC 1.012  PHURINE 5.0  GLUCOSEU NEGATIVE  HGBUR 1+*  BILIRUBINUR NEGATIVE  KETONESUR NEGATIVE  PROTEINUR 100*  NITRITE NEGATIVE  LEUKOCYTESUR NEGATIVE      Imaging: Dg Chest 2 View  08/06/2014   CLINICAL DATA:  Cough and fever, 1 week duration. No pain or dyspnea.  EXAM: CHEST  2 VIEW  COMPARISON:  08/03/2014  FINDINGS: There is unchanged mild cardiomegaly. The lungs are clear. The pulmonary vasculature is normal. There are no pleural effusions. There are prominent upper mediastinal contours which likely are due to fat ; this is unchanged from 08/03/2014 and from 02/17/2014.  IMPRESSION: Mild cardiomegaly.  No acute cardiopulmonary findings.   Electronically Signed   By: Andreas Newport M.D.   On:  08/06/2014 23:12   Dg Ankle Complete Right  08/07/2014   CLINICAL DATA:  Multiple right foot and ankle surgeries. Repeat injury last month.  EXAM: RIGHT ANKLE - COMPLETE 3+ VIEW  COMPARISON:  None.  FINDINGS: There is prior resection of the distal fibula. There are old tibial diaphyseal screw fragments. 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 Multiple bone fragments are scattered about the distal tibia, and there also is soft tissue calcification around the ankle. There is sclerosis and collapse of the calcaneus in its mid and anterior portions. There is a milder degree of sclerosis and irregularity of the cuboid and navicular.  No acute fracture is evident.  IMPRESSION: Severe chronic changes about the right hindfoot as detailed above. No convincing acute finding is evident.   Electronically Signed   By: Andreas Newport M.D.   On: 08/07/2014 02:57   US Renal  08/08/2014   CLINICAL DATA:  Renal failure.  Followup left renal mass.  EXAM: RENAL / URINARY TRACT ULTRASOUND COMPLETE  COMPARISON:  CT, 07/21/2014.  FINDINGS: Right Kidney:  Length: 12.2 cm. Borderline increased parenchymal echogenicity. No mass, stone or hydronephrosis.  Left Kidney:  Length: 11.4 cm. Normal parenchymal echogenicity. Mass with internal echoes but no convincing internal blood flow protrudes from the midpole of the left kidney measuring 3.3 cm x 2.9 cm x 3.1 cm. No hydronephrosis.  Bladder:  Appears normal for degree of bladder distention.  IMPRESSION: 1. No hydronephrosis.  No acute finding. 2. 3.3 cm left midpole exophytic renal mass. This could be a solid mass although no convincing blood flow was seen within this. Is more likely a complicated left renal cyst. Recommend either short-term followup with repeat ultrasound in 3-4 months to document stability or further characterization with renal MRI with and without contrast.   Electronically Signed   By:  Lajean Manes M.D.   On: 08/08/2014 10:44   Mr Ankle Right  Wo Contrast  08/08/2014   CLINICAL DATA:  Increasing right ankle swelling and pain for 2 or 3 weeks. History of osteomyelitis with multiple prior surgeries. No recent injury. Initial encounter.  EXAM: MRI OF THE RIGHT ANKLE WITHOUT CONTRAST  TECHNIQUE: Multiplanar, multisequence MR imaging of the ankle was performed. No intravenous contrast was administered.  COMPARISON:  Radiographs 08/07/2014.  FINDINGS: The study is markedly abnormal. Patient is status post resection of the distal fibula and tibia. There is near complete absence of the talus and navicular with large erosions involving the superior aspect of the calcaneal body. There is a large complex fluid collection surrounding the distal tibia,  measuring 7.6 x 8.2 x 8.0 cm. There are multiple foci of susceptibility artifact within this complex fluid collection, likely postsurgical. In correlation with the radiographs, there are multiple associated calcifications along the periphery of this fluid collection. There is a 1.8 cm calcific density medially.  TENDONS  Peroneal: Poorly visualized distally on the axial and coronal images. The tendons may be intact based on the sagittal images.  Posteromedial: The posterior tibialis tendon appears attenuated distally, but grossly intact. The other medial flexor tendons appear intact.  Anterior: Intact.  Achilles: Intact with mild diffuse tendinosis.  Plantar Fascia: Intact.  LIGAMENTS  Lateral: Not applicable given previous surgery.  Medial: Not applicable given previous surgery.  CARTILAGE  Ankle Joint: Not applicable given previous surgery and findings above.  Subtalar Joints/Sinus Tarsi: Not applicable given previous surgery and findings above.  Bones: Postsurgical findings are discussed above. There are large irregular erosions involving the superior aspect of the calcaneal body adjacent to the fluid collection. Screw defects are present within the  calcaneus and distal tibia. There is retained hardware within the distal tibia as correlated with prior radiographs. There is mild fragmentation of the cuboid and cuneiform bones which appears longstanding without associated marrow edema.  IMPRESSION: 1. Extensive abnormalities as described above, likely due to a combination of postsurgical change and chronic osteomyelitis. 2. Large complex fluid collection surrounding the resected distal tibia, potentially infected. Fluid sampling recommended. 3. Chronic osteomyelitis within the distal tibia is likely. No specific evidence of acute osteomyelitis within the visualized foot.   Electronically Signed   By: Richardean Sale M.D.   On: 08/08/2014 12:48     Medications:     . allopurinol  100 mg Oral Daily  . olmesartan  40 mg Oral Daily   And  . amLODipine  10 mg Oral Daily   And  . hydrochlorothiazide  25 mg Oral Daily  . aspirin  81 mg Oral Daily  . carvedilol  25 mg Oral BID  . cloNIDine  0.1 mg Oral BID  . ferrous sulfate  325 mg Oral Daily  . heparin  5,000 Units Subcutaneous 3 times per day  . insulin glargine  75 Units Subcutaneous Daily  . levothyroxine  100 mcg Oral QAC breakfast  . sodium bicarbonate  650 mg Oral BID  . vancomycin  1,250 mg Intravenous Q36H   acetaminophen **OR** acetaminophen, morphine injection, ondansetron **OR** ondansetron (ZOFRAN) IV  Assessment/ Plan:  49 y.o. male  1. CKD st 5 with mild uremic symptoms but critically elevated BUN/CR - electrolytes and volume status are acceptable. No acute indication for HD but patient is close. - will monitor closely 2. Left renal mass- outpatient work up is in progress with [facility] 3. AOCKD - EPO on hold because of renal mass 4. Rt foot osteo- evaluation under progress     LOS: 1 Brandee Markin 5/9/20169:40 PM

## 2014-08-08 NOTE — Progress Notes (Signed)
Patient plans to have MRI  Of right foot today. Wears hard boot stabilizer on right foot. Right foot with extreme edema. Patient has new dialysis fistula in left forearm that has not been accessed . Patient would like to hold off on dialysis until past daughter's graduation   In June if possible.patient has experienced recent move and remarriage.

## 2014-08-08 NOTE — Progress Notes (Signed)
Subjective: Patient reevaluated for the abscess in his right foot. MRI results showed a large fluid collection around the distal tibia and calcaneal region with evidence for bony osteomyelitis. Recommendation was made for fluid sampling.  Objective: Palpable fluctuant fluid mass at the nonunion site for the right tibia and calcaneus.  Assessment osteomyelitis with abscess right foot and ankle  Procedure: Betadine prep to the medial aspect of the right ankle where an 18-gauge needle was aseptically inserted into the fluid-filled area and 20 cc of bloody discharge was removed with some mild visual purulence. A sample of the fluid was then placed on a culture tube to be sent for Gram stain, anaerobic and aerobic analysis. Gauze and a sterile dressing was applied to the aspiration site. Patient tolerated procedure well.

## 2014-08-08 NOTE — Consult Note (Addendum)
Reason for Consult: [name] is a 49 year old male recently admitted for fever and chills for the past several days. He has a history of multiple surgeries on his right foot and ankle with a history of Charcot joint and osteomyelitis. States that about 1 month ago he felt a pop in his right ankle area and noticed significant instability in the foot. He is currently being treated by Dr. Prudy Feeler who is previously removed some hardware. He is walking in a Crow walker and is scheduled to have a torque brace fabricated for the right lower extremity. Referring Physician: Ramonita Lab M.D.  Tim Becker is an 49 y.o. male.  HPI: As mentioned above.  Past Medical History  Diagnosis Date  . ESRD (end stage renal disease)   . Diabetes mellitus without complication   . Coronary artery disease   . Thyroid disease     Past Surgical History  Procedure Laterality Date  . Coronary angioplasty with stent placement    . Joint replacement      Family History  Problem Relation Age of Onset  . Diabetes Mother     Social History:  reports that he has never smoked. He does not have any smokeless tobacco history on file. He reports that he does not drink alcohol or use illicit drugs.  Allergies: No Known Allergies  Medications: I have reviewed the patient's current medications.  Results for orders placed or performed during the hospital encounter of 08/06/14 (from the past 48 hour(s))  Comprehensive metabolic panel     Status: Abnormal   Collection Time: 08/06/14  9:52 PM  Result Value Ref Range   Sodium 137 135 - 145 mmol/L   Potassium 4.7 3.5 - 5.1 mmol/L   Chloride 100 (L) 101 - 111 mmol/L   CO2 23 22 - 32 mmol/L   Glucose, Bld 127 (H) 65 - 99 mg/dL   BUN 114 (H) 6 - 20 mg/dL   Creatinine, Ser 5.29 (H) 0.61 - 1.24 mg/dL   Calcium 8.5 (L) 8.9 - 10.3 mg/dL   Total Protein 7.3 6.5 - 8.1 g/dL   Albumin 3.2 (L) 3.5 - 5.0 g/dL   AST 22 15 - 41 U/L   ALT 19 17 - 63 U/L   Alkaline Phosphatase 114  38 - 126 U/L   Total Bilirubin 0.4 0.3 - 1.2 mg/dL   GFR calc non Af Amer 12 (L) >60 mL/min   GFR calc Af Amer 13 (L) >60 mL/min    Comment: (NOTE) The eGFR has been calculated using the CKD EPI equation. This calculation has not been validated in all clinical situations. eGFR's persistently <60 mL/min signify possible Chronic Kidney Disease.    Anion gap 14 5 - 15  Culture, blood (routine x 2)     Status: None (Preliminary result)   Collection Time: 08/06/14  9:52 PM  Result Value Ref Range   Specimen Description BLOOD    Special Requests NONE    Culture NO GROWTH 2 DAYS    Report Status PENDING   CBC with Differential     Status: Abnormal   Collection Time: 08/06/14  9:52 PM  Result Value Ref Range   WBC 13.2 (H) 3.8 - 10.6 K/uL   RBC 3.33 (L) 4.40 - 5.90 MIL/uL   Hemoglobin 8.4 (L) 13.0 - 18.0 g/dL   HCT 26.7 (L) 40.0 - 52.0 %   MCV 80.2 80.0 - 100.0 fL   MCH 25.1 (L) 26.0 - 34.0 pg   MCHC 31.3 (  L) 32.0 - 36.0 g/dL   RDW 15.5 (H) 11.5 - 14.5 %   Platelets 393 150 - 440 K/uL   Neutrophils Relative % 81 %   Neutro Abs 10.7 (H) 1.4 - 6.5 K/uL   Lymphocytes Relative 9 %   Lymphs Abs 1.1 1.0 - 3.6 K/uL   Monocytes Relative 8 %   Monocytes Absolute 1.1 (H) 0.2 - 1.0 K/uL   Eosinophils Relative 1 %   Eosinophils Absolute 0.2 0 - 0.7 K/uL   Basophils Relative 1 %   Basophils Absolute 0.1 0 - 0.1 K/uL  Urine culture     Status: None   Collection Time: 08/06/14  9:55 PM  Result Value Ref Range   Specimen Description URINE, CLEAN CATCH    Special Requests NONE    Culture      MULTIPLE SPECIES PRESENT, SUGGEST RECOLLECTION IF CLINICALLY INDICATED   Report Status 08/08/2014 FINAL   Urinalysis complete, with microscopic [facility])     Status: Abnormal   Collection Time: 08/06/14  9:55 PM  Result Value Ref Range   Color, Urine YELLOW (A) YELLOW   APPearance HAZY (A) CLEAR   Glucose, UA NEGATIVE NEGATIVE mg/dL   Bilirubin Urine NEGATIVE NEGATIVE   Ketones, ur NEGATIVE NEGATIVE  mg/dL   Specific Gravity, Urine 1.012 1.005 - 1.030   Hgb urine dipstick 1+ (A) NEGATIVE   pH 5.0 5.0 - 8.0   Protein, ur 100 (A) NEGATIVE mg/dL   Nitrite NEGATIVE NEGATIVE   Leukocytes, UA NEGATIVE NEGATIVE   RBC / HPF 0-5 0 - 5 RBC/hpf   WBC, UA 0-5 0 - 5 WBC/hpf   Bacteria, UA NONE SEEN NONE SEEN   Squamous Epithelial / LPF NONE SEEN NONE SEEN  Culture, blood (routine x 2)     Status: None (Preliminary result)   Collection Time: 08/07/14  2:53 AM  Result Value Ref Range   Specimen Description BLOOD    Special Requests NONE    Culture NO GROWTH 1 DAY    Report Status PENDING   CBC     Status: Abnormal   Collection Time: 08/07/14  6:47 AM  Result Value Ref Range   WBC 12.4 (H) 3.8 - 10.6 K/uL   RBC 3.26 (L) 4.40 - 5.90 MIL/uL   Hemoglobin 8.1 (L) 13.0 - 18.0 g/dL   HCT 26.3 (L) 40.0 - 52.0 %   MCV 80.7 80.0 - 100.0 fL   MCH 24.9 (L) 26.0 - 34.0 pg   MCHC 30.8 (L) 32.0 - 36.0 g/dL   RDW 15.2 (H) 11.5 - 14.5 %   Platelets 394 150 - 440 K/uL  Creatinine, serum     Status: Abnormal   Collection Time: 08/07/14  6:47 AM  Result Value Ref Range   Creatinine, Ser 5.43 (H) 0.61 - 1.24 mg/dL   GFR calc non Af Amer 11 (L) >60 mL/min   GFR calc Af Amer 13 (L) >60 mL/min    Comment: (NOTE) The eGFR has been calculated using the CKD EPI equation. This calculation has not been validated in all clinical situations. eGFR's persistently <60 mL/min signify possible Chronic Kidney Disease.   Glucose, capillary     Status: None   Collection Time: 08/07/14  8:15 AM  Result Value Ref Range   Glucose-Capillary 89 70 - 99 mg/dL   Comment 1 Notify RN   Glucose, capillary     Status: Abnormal   Collection Time: 08/07/14 11:20 AM  Result Value Ref Range   Glucose-Capillary  151 (H) 70 - 99 mg/dL   Comment 1 Notify RN   Glucose, capillary     Status: Abnormal   Collection Time: 08/07/14  4:22 PM  Result Value Ref Range   Glucose-Capillary 130 (H) 70 - 99 mg/dL   Comment 1 Notify RN    Glucose, capillary     Status: Abnormal   Collection Time: 08/07/14  9:07 PM  Result Value Ref Range   Glucose-Capillary 135 (H) 70 - 99 mg/dL  Glucose, capillary     Status: None   Collection Time: 08/08/14  7:43 AM  Result Value Ref Range   Glucose-Capillary 97 70 - 99 mg/dL   Comment 1 Notify RN   Glucose, capillary     Status: Abnormal   Collection Time: 08/08/14 11:54 AM  Result Value Ref Range   Glucose-Capillary 154 (H) 70 - 99 mg/dL   Comment 1 Notify RN     Dg Chest 2 View  08/06/2014   CLINICAL DATA:  Cough and fever, 1 week duration. No pain or dyspnea.  EXAM: CHEST  2 VIEW  COMPARISON:  08/03/2014  FINDINGS: There is unchanged mild cardiomegaly. The lungs are clear. The pulmonary vasculature is normal. There are no pleural effusions. There are prominent upper mediastinal contours which likely are due to fat ; this is unchanged from 08/03/2014 and from 02/17/2014.  IMPRESSION: Mild cardiomegaly.  No acute cardiopulmonary findings.   Electronically Signed   By: Andreas Newport M.D.   On: 08/06/2014 23:12   Dg Ankle Complete Right  08/07/2014   CLINICAL DATA:  Multiple right foot and ankle surgeries. Repeat injury last month.  EXAM: RIGHT ANKLE - COMPLETE 3+ VIEW  COMPARISON:  None.  FINDINGS: There is prior resection of the distal fibula. There are old tibial diaphyseal screw fragments. 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 Multiple bone fragments are scattered about the distal tibia, and there also is soft tissue calcification around the ankle. There is sclerosis and collapse of the calcaneus in its mid and anterior portions. There is a milder degree of sclerosis and irregularity of the cuboid and navicular.  No acute fracture is evident.  IMPRESSION: Severe chronic changes about the right hindfoot as detailed above. No convincing acute finding is evident.   Electronically Signed   By: Andreas Newport M.D.   On: 08/07/2014 02:57   US Renal  08/08/2014   CLINICAL DATA:  Renal failure.  Followup left renal mass.  EXAM: RENAL / URINARY TRACT ULTRASOUND COMPLETE  COMPARISON:  CT, 07/21/2014.  FINDINGS: Right Kidney:  Length: 12.2 cm. Borderline increased parenchymal echogenicity. No mass, stone or hydronephrosis.  Left Kidney:  Length: 11.4 cm. Normal parenchymal echogenicity. Mass with internal echoes but no convincing internal blood flow protrudes from the midpole of the left kidney measuring 3.3 cm x 2.9 cm x 3.1 cm. No hydronephrosis.  Bladder:  Appears normal for degree of bladder distention.  IMPRESSION: 1. No hydronephrosis.  No acute finding. 2. 3.3 cm left midpole exophytic renal mass. This could be a solid mass although no convincing blood flow was seen within this. Is more likely a complicated left renal cyst. Recommend either short-term followup with repeat ultrasound in 3-4 months to document stability or further characterization with renal MRI with and without contrast.   Electronically Signed   By: Lajean Manes M.D.   On: 08/08/2014 10:44   Mr Ankle Right  Wo Contrast  08/08/2014   CLINICAL DATA:  Increasing right ankle swelling and pain for 2 or 3 weeks. History of osteomyelitis with multiple prior surgeries. No recent injury. Initial encounter.  EXAM: MRI OF THE RIGHT ANKLE WITHOUT CONTRAST  TECHNIQUE: Multiplanar, multisequence MR imaging of the ankle was performed. No intravenous contrast was administered.  COMPARISON:  Radiographs 08/07/2014.  FINDINGS: The study is markedly abnormal. Patient is status post resection of the distal fibula and tibia. There is near complete absence of the talus and navicular with large erosions involving the superior aspect of the calcaneal body. There is a large complex fluid collection surrounding the distal tibia, measuring 7.6 x 8.2 x 8.0 cm. There are multiple foci of susceptibility artifact within this complex fluid collection, likely  postsurgical. In correlation with the radiographs, there are multiple associated calcifications along the periphery of this fluid collection. There is a 1.8 cm calcific density medially.  TENDONS  Peroneal: Poorly visualized distally on the axial and coronal images. The tendons may be intact based on the sagittal images.  Posteromedial: The posterior tibialis tendon appears attenuated distally, but grossly intact. The other medial flexor tendons appear intact.  Anterior: Intact.  Achilles: Intact with mild diffuse tendinosis.  Plantar Fascia: Intact.  LIGAMENTS  Lateral: Not applicable given previous surgery.  Medial: Not applicable given previous surgery.  CARTILAGE  Ankle Joint: Not applicable given previous surgery and findings above.  Subtalar Joints/Sinus Tarsi: Not applicable given previous surgery and findings above.  Bones: Postsurgical findings are discussed above. There are large irregular erosions involving the superior aspect of the calcaneal body adjacent to the fluid collection. Screw defects are present within the calcaneus and distal tibia. There is retained hardware within the distal tibia as correlated with prior radiographs. There is mild fragmentation of the cuboid and cuneiform bones which appears longstanding without associated marrow edema.  IMPRESSION: 1. Extensive abnormalities as described above, likely due to a combination of postsurgical change and chronic osteomyelitis. 2. Large complex fluid collection surrounding the resected distal tibia, potentially infected. Fluid sampling recommended. 3. Chronic osteomyelitis within the distal tibia is likely. No specific evidence of acute osteomyelitis within the visualized foot.   Electronically Signed   By: Richardean Sale M.D.   On: 08/08/2014 12:48    Review of Systems  Constitutional: Positive for fever and chills.  HENT: Negative.   Eyes: Negative.   Respiratory: Negative.   Cardiovascular: Positive for leg swelling (In his right  foot and ankle which is chronic.).  Gastrointestinal: Negative.   Genitourinary: Negative.   Musculoskeletal: Negative.   Skin: Negative.   Neurological: Positive for tingling (significant neuropathy in both lower extremities from his diabetes).  Endo/Heme/Allergies: Negative.   Psychiatric/Behavioral: Negative.    Blood pressure 138/69, pulse 81, temperature 98.6 F (37 C), temperature source Oral, resp. rate 18, height _0  (1.727 m), weight 117.028 kg (258 lb), SpO2 100 %. Physical Exam  Cardiovascular:  DP and PT pulses are palpable on the left. Not clearly palpable on the right secondary to edema. Capillary filling time is intact.  Musculoskeletal:  Adequate range of motion in the pedal and ankle joints of the left. There is significant instability in the right rear foot with a palpable fluctuant mass at the level of the rear foot and ankle from his previous nonunion. From palpation this does feel consistent with a significant abscess. Muscle testing is deferred.  Neurological:  The patient is completely neuropathic bilateral. Loss of protective threshold monofilament wire. Proprioception is absent  Skin:  The skin is warm dry and supple. There is some significant chronic edema with hyperpigmentation skin in the right foot and ankle area. No active ulcerations or open sores. No advancing cellulitis.    X-rays: Multiple views of the right ankle reveal previous resection of the distal fibula and the talus. Significant erosive changes are noted in the anterior calcaneus and midfoot with some calcifications noted in the soft tissues around the distal tibia. Calcified blood vessels are noted distally in the foot.   Assessment/Plan: Diabetes with neuropathy. Evidence for osteomyelitis and abscess right rear foot  Discussed with the patient that most likely this scenario is consistent with a recurrent infection in his right rear foot. Discussed that with the significant instability in the  foot most likely his best option will be to pursue a below-knee amputation that has previously been discussed in the past. I will attempt to contact Dr. Prudy Feeler about his recent progress there. Patient is scheduled to have an MRI. His other option would be to do a needle aspiration to evaluate for purulence, but again if this is positive he will end up with a below-knee amputation regardless.  Esme Freund W. 08/08/2014, 1:05 PM

## 2014-08-08 NOTE — Plan of Care (Signed)
Problem: Phase I Progression Outcomes Goal: OOB as tolerated unless otherwise ordered Outcome: Progressing Not bearing weight on right foot wears supportive boot

## 2014-08-09 ENCOUNTER — Encounter: Payer: Self-pay | Admitting: Infectious Diseases

## 2014-08-09 DIAGNOSIS — M86679 Other chronic osteomyelitis, unspecified ankle and foot: Secondary | ICD-10-CM | POA: Diagnosis present

## 2014-08-09 LAB — CBC
HCT: 24.5 % — ABNORMAL LOW (ref 40.0–52.0)
Hemoglobin: 7.6 g/dL — ABNORMAL LOW (ref 13.0–18.0)
MCH: 25.1 pg — ABNORMAL LOW (ref 26.0–34.0)
MCHC: 31 g/dL — ABNORMAL LOW (ref 32.0–36.0)
MCV: 81.1 fL (ref 80.0–100.0)
Platelets: 403 10*3/uL (ref 150–440)
RBC: 3.02 MIL/uL — ABNORMAL LOW (ref 4.40–5.90)
RDW: 15.2 % — ABNORMAL HIGH (ref 11.5–14.5)
WBC: 8.7 10*3/uL (ref 3.8–10.6)

## 2014-08-09 LAB — GLUCOSE, CAPILLARY
Glucose-Capillary: 190 mg/dL — ABNORMAL HIGH (ref 70–99)
Glucose-Capillary: 204 mg/dL — ABNORMAL HIGH (ref 70–99)
Glucose-Capillary: 188 mg/dL — ABNORMAL HIGH (ref 70–99)
Glucose-Capillary: 198 mg/dL — ABNORMAL HIGH (ref 70–99)

## 2014-08-09 NOTE — Progress Notes (Signed)
Subjective:   Patient known to our practice from outpatient. He is followed for CKD st 5 admitted for evaluation of fever. Found to have fluid collection / abscess in rt foot Outpatient U.A and CXR were clear BUN/CR are critically high  No N/V Abscess aspirated yesterday  Objective:  Vital signs in last 24 hours:  Temp:  [97.4 F (36.3 C)-99.1 F (37.3 C)] 97.4 F (36.3 C) (05/10 0752) Pulse Rate:  [69-80] 76 (05/10 0752) Resp:  [18] 18 (05/10 0752) BP: (118-139)/(58-67) 132/67 mmHg (05/10 0752) SpO2:  [95 %-99 %] 95 % (05/10 0752)  Weight change:  Filed Weights   08/06/14 2131  Weight: 117.028 kg (258 lb)    Intake/Output: I/O last 3 completed shifts: In: 840 [P.O.:840] Out: 1500 [Urine:1500]   Intake/Output this shift:  Total I/O In: 480 [P.O.:480] Out: 400 [Urine:400]  Physical Exam: General: NAD,   Head: Normocephalic, atraumatic. Moist oral mucosal membranes  Eyes: Anicteric, PERRL  Neck: Supple, trachea midline  Lungs:  Clear to auscultation  Heart: Regular rate and rhythm  Abdomen:  Soft, nontender,   Extremities:  + peripheral edema.  Neurologic: Nonfocal, moving all four extremities  Skin: No lesions  Access: AVF    Basic Metabolic Panel:  Recent Labs Lab 08/06/14 2152 08/07/14 0647  NA 137  --   K 4.7  --   CL 100*  --   CO2 23  --   GLUCOSE 127*  --   BUN 114*  --   CREATININE 5.29* 5.43*  CALCIUM 8.5*  --     Liver Function Tests:  Recent Labs Lab 08/06/14 2152  AST 22  ALT 19  ALKPHOS 114  BILITOT 0.4  PROT 7.3  ALBUMIN 3.2*   No results for input(s): LIPASE, AMYLASE in the last 168 hours. No results for input(s): AMMONIA in the last 168 hours.  CBC:  Recent Labs Lab 08/06/14 2152 08/07/14 0647 08/09/14 0929  WBC 13.2* 12.4* 8.7  NEUTROABS 10.7*  --   --   HGB 8.4* 8.1* 7.6*  HCT 26.7* 26.3* 24.5*  MCV 80.2 80.7 81.1  PLT 393 394 403    Cardiac Enzymes: No results for input(s): CKTOTAL, CKMB, CKMBINDEX,  TROPONINI in the last 168 hours.  BNP: Invalid input(s): POCBNP  CBG:  Recent Labs Lab 08/08/14 1154 08/08/14 1641 08/08/14 2110 08/09/14 0752 08/09/14 1127  GLUCAP 154* 229* 214* 190* 204*    Microbiology: Results for orders placed or performed during the hospital encounter of 08/06/14  Culture, blood (routine x 2)     Status: None (Preliminary result)   Collection Time: 08/06/14  9:52 PM  Result Value Ref Range Status   Specimen Description BLOOD  Final   Special Requests NONE  Final   Culture NO GROWTH 3 DAYS  Final   Report Status PENDING  Incomplete  Urine culture     Status: None   Collection Time: 08/06/14  9:55 PM  Result Value Ref Range Status   Specimen Description URINE, CLEAN CATCH  Final   Special Requests NONE  Final   Culture   Final    MULTIPLE SPECIES PRESENT, SUGGEST RECOLLECTION IF CLINICALLY INDICATED   Report Status 08/08/2014 FINAL  Final  Culture, blood (routine x 2)     Status: None (Preliminary result)   Collection Time: 08/07/14  2:53 AM  Result Value Ref Range Status   Specimen Description BLOOD  Final   Special Requests NONE  Final   Culture NO GROWTH 2 DAYS  Final   Report Status PENDING  Incomplete  Wound culture     Status: None (Preliminary result)   Collection Time: 08/08/14  8:41 PM  Result Value Ref Range Status   Specimen Description ABSCESS  Final   Special Requests NONE  Final   Gram Stain PENDING  Incomplete   Culture NO GROWTH < 24 HOURS  Final   Report Status PENDING  Incomplete    Coagulation Studies: No results for input(s): LABPROT, INR in the last 72 hours.  Urinalysis:  Recent Labs  08/06/14 2155  COLORURINE YELLOW*  LABSPEC 1.012  PHURINE 5.0  GLUCOSEU NEGATIVE  HGBUR 1+*  BILIRUBINUR NEGATIVE  KETONESUR NEGATIVE  PROTEINUR 100*  NITRITE NEGATIVE  LEUKOCYTESUR NEGATIVE      Imaging: US Renal  08/08/2014   CLINICAL DATA:  Renal failure.  Followup left renal mass.  EXAM: RENAL / URINARY TRACT  ULTRASOUND COMPLETE  COMPARISON:  CT, 07/21/2014.  FINDINGS: Right Kidney:  Length: 12.2 cm. Borderline increased parenchymal echogenicity. No mass, stone or hydronephrosis.  Left Kidney:  Length: 11.4 cm. Normal parenchymal echogenicity. Mass with internal echoes but no convincing internal blood flow protrudes from the midpole of the left kidney measuring 3.3 cm x 2.9 cm x 3.1 cm. No hydronephrosis.  Bladder:  Appears normal for degree of bladder distention.  IMPRESSION: 1. No hydronephrosis.  No acute finding. 2. 3.3 cm left midpole exophytic renal mass. This could be a solid mass although no convincing blood flow was seen within this. Is more likely a complicated left renal cyst. Recommend either short-term followup with repeat ultrasound in 3-4 months to document stability or further characterization with renal MRI with and without contrast.   Electronically Signed   By: Lajean Manes M.D.   On: 08/08/2014 10:44   Mr Ankle Right  Wo Contrast  08/08/2014   CLINICAL DATA:  Increasing right ankle swelling and pain for 2 or 3 weeks. History of osteomyelitis with multiple prior surgeries. No recent injury. Initial encounter.  EXAM: MRI OF THE RIGHT ANKLE WITHOUT CONTRAST  TECHNIQUE: Multiplanar, multisequence MR imaging of the ankle was performed. No intravenous contrast was administered.  COMPARISON:  Radiographs 08/07/2014.  FINDINGS: The study is markedly abnormal. Patient is status post resection of the distal fibula and tibia. There is near complete absence of the talus and navicular with large erosions involving the superior aspect of the calcaneal body. There is a large complex fluid collection surrounding the distal tibia, measuring 7.6 x 8.2 x 8.0 cm. There are multiple foci of susceptibility artifact within this complex fluid collection, likely postsurgical. In correlation with the radiographs, there are multiple associated calcifications along the periphery of this fluid collection. There is a 1.8 cm  calcific density medially.  TENDONS  Peroneal: Poorly visualized distally on the axial and coronal images. The tendons may be intact based on the sagittal images.  Posteromedial: The posterior tibialis tendon appears attenuated distally, but grossly intact. The other medial flexor tendons appear intact.  Anterior: Intact.  Achilles: Intact with mild diffuse tendinosis.  Plantar Fascia: Intact.  LIGAMENTS  Lateral: Not applicable given previous surgery.  Medial: Not applicable given previous surgery.  CARTILAGE  Ankle Joint: Not applicable given previous surgery and findings above.  Subtalar Joints/Sinus Tarsi: Not applicable given previous surgery and findings above.  Bones: Postsurgical findings are discussed above. There are large irregular erosions involving the superior aspect of the calcaneal body adjacent to the fluid collection. Screw defects are present within the calcaneus  and distal tibia. There is retained hardware within the distal tibia as correlated with prior radiographs. There is mild fragmentation of the cuboid and cuneiform bones which appears longstanding without associated marrow edema.  IMPRESSION: 1. Extensive abnormalities as described above, likely due to a combination of postsurgical change and chronic osteomyelitis. 2. Large complex fluid collection surrounding the resected distal tibia, potentially infected. Fluid sampling recommended. 3. Chronic osteomyelitis within the distal tibia is likely. No specific evidence of acute osteomyelitis within the visualized foot.   Electronically Signed   By: Richardean Sale M.D.   On: 08/08/2014 12:48     Medications:     . allopurinol  100 mg Oral Daily  . olmesartan  40 mg Oral Daily   And  . amLODipine  10 mg Oral Daily   And  . hydrochlorothiazide  25 mg Oral Daily  . aspirin  81 mg Oral Daily  . carvedilol  25 mg Oral BID  . cloNIDine  0.1 mg Oral BID  . ferrous sulfate  325 mg Oral Daily  . heparin subcutaneous  5,000 Units  Subcutaneous 3 times per day  . insulin glargine  75 Units Subcutaneous Daily  . levothyroxine  100 mcg Oral QAC breakfast  . sodium bicarbonate  650 mg Oral BID  . vancomycin  1,250 mg Intravenous Q36H   acetaminophen **OR** acetaminophen, morphine injection, ondansetron **OR** ondansetron (ZOFRAN) IV  Assessment/ Plan:  49 y.o. male  1. CKD st 5 with mild uremic symptoms but critically elevated BUN/CR - electrolytes and volume status are acceptable. No acute indication for HD but patient is close. - will monitor closely 2. Left renal mass- outpatient work up is in progress with [facility] 3. AOCKD - EPO on hold because of renal mass 4. Rt foot osteo- evaluation under progress.  Aspirated on 5/09. Results awaited.     LOS: 2 Faviola Klare 5/10/20161:42 PM

## 2014-08-09 NOTE — Progress Notes (Signed)
RD Assessment  Admitted with: osteomyelitis of right foot PMHx: ESRD (not on HD), DM, CAD  Current Diet: heart healthy/ carb modified Typical Food/ Fluid Intake: 100% of meals recorded per I/O Meal/ Snack Patterns: patient reports eating a regular diet at home with no restrictions. Reports a reduced appetite/ intake due to "not feeling well" x 2-3 months.   Supplements: None  Food Allergies: NKFA Food Preferences: Reviewed  Ht: 68" Current weight: 258# BMI: 29.3 Weight Changes: Reviewed old hospital records from Nov 2015, pt wt= 260. Current weight is 99% of weight recorded Nov 2015. Patient states that he usually weighs 280#, last time unknown. He does report that he has "lost a few pounds" in recent months.  UOP: 400 ml/ 24 hrs Digestive: Last BM- 5/9 Gastrointestinal: Reviewed Skin: Cellulitis, right foot Physical Findings: n/a  Labs:  Electrolyte and Renal Profile:    Recent Labs Lab 08/06/14 2152 08/07/14 0647  BUN 114*  --   CREATININE 5.29* 5.43*  NA 137  --   K 4.7  --    Protein Profile:  Recent Labs Lab 08/06/14 2152  ALBUMIN 3.2*   Glucose Profile:  Recent Labs  08/08/14 2110 08/09/14 0752 08/09/14 1127  GLUCAP 214* 190* 204*    Meds: FeSO4, Lantus  PES Statement: Inadequate oral intake related to acute illness as evidenced by pt report of decreased appetite/ intake x 2-3 months.  Intervention: 1. Meals and Snacks: Cater to patient preferences 2. Nutrition Education: if patient progresses to HD as Nephrology believes he is close to needing, will re-assess education needs and follow up.  Monitoring/ Evaluation: 1. Energy Intake: goal for patient to meet >90% of estimated needs. 2. Electrolyte and Renal Profile 3. Glucose Profile  LOW Care Level  Roda Shutters, RDN Pager: [phone] Office: [phone]

## 2014-08-09 NOTE — Progress Notes (Signed)
Subjective: Sitting up in a chair. No c/o pain. Right foot dressing in place. No SOB, chest pain or abdominal pain.   Objective: Vital signs in last 24 hours: Temp:  [97.4 F (36.3 C)-99.1 F (37.3 C)] 97.4 F (36.3 C) (05/10 0752) Pulse Rate:  [69-80] 76 (05/10 0752) Resp:  [18] 18 (05/10 0752) BP: (118-139)/(58-67) 132/67 mmHg (05/10 0752) SpO2:  [95 %-99 %] 95 % (05/10 0752) Weight change:     Intake/Output from previous day: 05/09 0701 - 05/10 0700 In: 840 [P.O.:840] Out: 1100 [Urine:1100] Intake/Output this shift: Total I/O In: 480 [P.O.:480] Out: 400 [Urine:400]  General appearance: alert, cooperative and no distress Head: Normocephalic, without obvious abnormality, atraumatic Neck: no JVD, supple, symmetrical, trachea midline and thyroid not enlarged Resp: clear to auscultation bilaterally Cardio: regular rate and rhythm, S1, S2 normal, no murmur, rub or gallop GI: soft, non-tender; no masses,  no organomegaly Extremities: right foot and lower leg dressing intact, no edema of left lower leg  Neurologic: Alert and oriented X 3, moving all extremities.  Lab Results:  Recent Labs  08/07/14 0647 08/09/14 0929  WBC 12.4* 8.7  HGB 8.1* 7.6*  HCT 26.3* 24.5*  PLT 394 403   BMET  Recent Labs  08/06/14 2152 08/07/14 0647  NA 137  --   K 4.7  --   CL 100*  --   CO2 23  --   GLUCOSE 127*  --   BUN 114*  --   CREATININE 5.29* 5.43*  CALCIUM 8.5*  --     Studies/Results: US Renal  08/08/2014   CLINICAL DATA:  Renal failure.  Followup left renal mass.  EXAM: RENAL / URINARY TRACT ULTRASOUND COMPLETE  COMPARISON:  CT, 07/21/2014.  FINDINGS: Right Kidney:  Length: 12.2 cm. Borderline increased parenchymal echogenicity. No mass, stone or hydronephrosis.  Left Kidney:  Length: 11.4 cm. Normal parenchymal echogenicity. Mass with internal echoes but no convincing internal blood flow protrudes from the midpole of the left kidney measuring 3.3 cm x 2.9 cm x 3.1 cm. No  hydronephrosis.  Bladder:  Appears normal for degree of bladder distention.  IMPRESSION: 1. No hydronephrosis.  No acute finding. 2. 3.3 cm left midpole exophytic renal mass. This could be a solid mass although no convincing blood flow was seen within this. Is more likely a complicated left renal cyst. Recommend either short-term followup with repeat ultrasound in 3-4 months to document stability or further characterization with renal MRI with and without contrast.   Electronically Signed   By: Lajean Manes M.D.   On: 08/08/2014 10:44   Mr Ankle Right  Wo Contrast  08/08/2014   CLINICAL DATA:  Increasing right ankle swelling and pain for 2 or 3 weeks. History of osteomyelitis with multiple prior surgeries. No recent injury. Initial encounter.  EXAM: MRI OF THE RIGHT ANKLE WITHOUT CONTRAST  TECHNIQUE: Multiplanar, multisequence MR imaging of the ankle was performed. No intravenous contrast was administered.  COMPARISON:  Radiographs 08/07/2014.  FINDINGS: The study is markedly abnormal. Patient is status post resection of the distal fibula and tibia. There is near complete absence of the talus and navicular with large erosions involving the superior aspect of the calcaneal body. There is a large complex fluid collection surrounding the distal tibia, measuring 7.6 x 8.2 x 8.0 cm. There are multiple foci of susceptibility artifact within this complex fluid collection, likely postsurgical. In correlation with the radiographs, there are multiple associated calcifications along the periphery of this fluid collection. There  is a 1.8 cm calcific density medially.  TENDONS  Peroneal: Poorly visualized distally on the axial and coronal images. The tendons may be intact based on the sagittal images.  Posteromedial: The posterior tibialis tendon appears attenuated distally, but grossly intact. The other medial flexor tendons appear intact.  Anterior: Intact.  Achilles: Intact with mild diffuse tendinosis.  Plantar Fascia:  Intact.  LIGAMENTS  Lateral: Not applicable given previous surgery.  Medial: Not applicable given previous surgery.  CARTILAGE  Ankle Joint: Not applicable given previous surgery and findings above.  Subtalar Joints/Sinus Tarsi: Not applicable given previous surgery and findings above.  Bones: Postsurgical findings are discussed above. There are large irregular erosions involving the superior aspect of the calcaneal body adjacent to the fluid collection. Screw defects are present within the calcaneus and distal tibia. There is retained hardware within the distal tibia as correlated with prior radiographs. There is mild fragmentation of the cuboid and cuneiform bones which appears longstanding without associated marrow edema.  IMPRESSION: 1. Extensive abnormalities as described above, likely due to a combination of postsurgical change and chronic osteomyelitis. 2. Large complex fluid collection surrounding the resected distal tibia, potentially infected. Fluid sampling recommended. 3. Chronic osteomyelitis within the distal tibia is likely. No specific evidence of acute osteomyelitis within the visualized foot.   Electronically Signed   By: Richardean Sale M.D.   On: 08/08/2014 12:48    Medications:  Scheduled: . allopurinol  100 mg Oral Daily  . olmesartan  40 mg Oral Daily   And  . amLODipine  10 mg Oral Daily   And  . hydrochlorothiazide  25 mg Oral Daily  . aspirin  81 mg Oral Daily  . carvedilol  25 mg Oral BID  . cloNIDine  0.1 mg Oral BID  . ferrous sulfate  325 mg Oral Daily  . heparin subcutaneous  5,000 Units Subcutaneous 3 times per day  . insulin glargine  75 Units Subcutaneous Daily  . levothyroxine  100 mcg Oral QAC breakfast  . sodium bicarbonate  650 mg Oral BID  . vancomycin  1,250 mg Intravenous Q36H    Assessment/Plan: Chronic osteomyelitis of right ankle and abscess in his right foot. MRI results showed a large fluid collection around the distal tibia and calcaneal region  with evidence for bony osteomyelitis. Recommendation was made for fluid sampling was performed by podiatry yesterday. Prelim c/s negative. Fever and leukocytosis are improving, continue Vancomycin, ID input appreciated.   Left renal mass: noted on renal u/s, no hydronephrosis. Patient will need MRI with contrast and follow up with his urologist.  Stage 5 CKD, closely followed by nephrology.    LOS: 2 days   Sinclair Alligood 08/09/2014, 1:44 PM

## 2014-08-09 NOTE — Plan of Care (Signed)
Problem: Phase I Progression Outcomes Goal: Pain controlled with appropriate interventions Outcome: Completed/Met Date Met:  08/09/14 No complaints of pain

## 2014-08-09 NOTE — Progress Notes (Signed)
Inpatient Diabetes Program Recommendations  AACE/ADA: New Consensus Statement on Inpatient Glycemic Control (2013)  Target Ranges:  Prepandial:   less than 140 mg/dL      Peak postprandial:   less than 180 mg/dL (1-2 hours)      Critically ill patients:  140 - 180 mg/dL   Inpatient Diabetes Program Recommendations Correction (SSI): add Novolog sensitive scale TID + HS scale per Glycemic Control order-set Thank you  Raoul Pitch BSN, RN,CDE Inpatient Diabetes Coordinator [phone] (team pager)

## 2014-08-09 NOTE — Consult Note (Signed)
Reason for Consult:Osteomyelitis    Referring Physician: Glendon Axe  Principal Problem:   Osteomyelitis of ankle or foot, right, acute Active Problems:   Type 2 diabetes mellitus with diabetic neuropathy   Essential hypertension   Obesity   Hypothyroidism   HPI: Tim Becker is a 49 y.o. male with a history of DM, charcot deformity of R foot as well as recurrent infection of the R foot admitted 5/7 with fevers, foot pain and swelling and redness.  He has had issues with recurrent infection in the R leg for 7-8 years due to charcot deformity.  Previously followed in [place] where he first had surgery in 2014.  He moved to [facility] and was following with Podiatry in [place] where last summer he had removal of hardware, ? Bone grafting as well as 6 weeks IV abx (he does not know which abx or what organisms were isolated).  He has been on suppressive doxycycline since July 2015 and was relatively well untuil about a month ago when he felt a :"pop" in that leg. Then developed worsening swelling, redness and some pain. Started having fevers as well.   Since admit has been seen by Dr Cleda Mccreedy and had MRI showing fluid collection and osteomyelitis. Dr Cleda Mccreedy performed aspiration of 20 cc of bloody purulent fluid.  Past Medical History  Diagnosis Date  . ESRD (end stage renal disease)     Pre- dialysis  . DM (diabetes mellitus) with complications   . Coronary artery disease   . Thyroid disease   . Osteomyelitis, chronic, ankle or foot    Past Surgical History  Procedure Laterality Date  . Coronary angioplasty with stent placement    . Joint replacement    . Foot surgery      Multiple R foot surgery for infection and charcot foot     Allergies: No Known Allergies  Current antibiotics: Antibiotics Given (last 72 hours)    Date/Time Action Medication Dose Rate   08/07/14 1120 Given   vancomycin (VANCOCIN) 1,250 mg in sodium chloride 0.9 % 250 mL IVPB 1,250 mg 166.7 mL/hr   08/07/14 1122  Given   doxycycline (VIBRA-TABS) tablet 100 mg 100 mg    08/08/14 0006 Given   vancomycin (VANCOCIN) 1,250 mg in sodium chloride 0.9 % 250 mL IVPB 1,250 mg 166.7 mL/hr   08/09/14 1129 Given   vancomycin (VANCOCIN) 1,250 mg in sodium chloride 0.9 % 250 mL IVPB 1,250 mg 166.7 mL/hr      Antibiotics Given (last 72 hours)    Date/Time Action Medication Dose Rate   08/07/14 1120 Given   vancomycin (VANCOCIN) 1,250 mg in sodium chloride 0.9 % 250 mL IVPB 1,250 mg 166.7 mL/hr   08/07/14 1122 Given   doxycycline (VIBRA-TABS) tablet 100 mg 100 mg    08/08/14 0006 Given   vancomycin (VANCOCIN) 1,250 mg in sodium chloride 0.9 % 250 mL IVPB 1,250 mg 166.7 mL/hr   08/09/14 1129 Given   vancomycin (VANCOCIN) 1,250 mg in sodium chloride 0.9 % 250 mL IVPB 1,250 mg 166.7 mL/hr      MEDICATIONS: . allopurinol  100 mg Oral Daily  . olmesartan  40 mg Oral Daily   And  . amLODipine  10 mg Oral Daily   And  . hydrochlorothiazide  25 mg Oral Daily  . aspirin  81 mg Oral Daily  . carvedilol  25 mg Oral BID  . cloNIDine  0.1 mg Oral BID  . ferrous sulfate  325 mg Oral Daily  .  heparin subcutaneous  5,000 Units Subcutaneous 3 times per day  . insulin glargine  75 Units Subcutaneous Daily  . levothyroxine  100 mcg Oral QAC breakfast  . sodium bicarbonate  650 mg Oral BID  . vancomycin  1,250 mg Intravenous Q36H    History  Substance Use Topics  . Smoking status: Never Smoker   . Smokeless tobacco: Not on file  . Alcohol Use: No    Family History  Problem Relation Age of Onset  . Diabetes Mother     Review of Systems - 11 systems reviewed and negative per HPI   OBJECTIVE: Temp:  [97.4 F (36.3 C)-99.1 F (37.3 C)] 97.4 F (36.3 C) (05/10 0752) Pulse Rate:  [69-80] 76 (05/10 0752) Resp:  [18] 18 (05/10 0752) BP: (118-139)/(58-67) 132/67 mmHg (05/10 0752) SpO2:  [95 %-99 %] 95 % (05/10 0752) GENERAL:currently in no acute distress.  HEAD: Normocephalic, atraumatic.  EYES: Pupils  equal, round, reactive to light. Extraocular muscles intact. No scleral icterus.  MOUTH: Moist mucosal membrane. Dentition intact. No abscess noted.  EAR, NOSE, THROAT: Clear without exudates. No external lesions.  NECK: Supple. No thyromegaly. No nodules. No JVD.  PULMONARY: Clear to ascultation, without wheeze rails or rhonci. No use of accessory muscles, Good respiratory effort. good air entry bilaterally CHEST: Nontender to palpation.  CARDIOVASCULAR: S1 and S2. Regular rate and rhythm. No murmurs, rubs, or gallops. No edema. Pedal pulses 2+ bilaterally Ext - RLE with multiple scars, warm, swollen, mildly tender.   LABS: Results for orders placed or performed during the hospital encounter of 08/06/14 (from the past 48 hour(s))  Glucose, capillary     Status: Abnormal   Collection Time: 08/07/14  4:22 PM  Result Value Ref Range   Glucose-Capillary 130 (H) 70 - 99 mg/dL   Comment 1 Notify RN   Glucose, capillary     Status: Abnormal   Collection Time: 08/07/14  9:07 PM  Result Value Ref Range   Glucose-Capillary 135 (H) 70 - 99 mg/dL  Glucose, capillary     Status: None   Collection Time: 08/08/14  7:43 AM  Result Value Ref Range   Glucose-Capillary 97 70 - 99 mg/dL   Comment 1 Notify RN   Glucose, capillary     Status: Abnormal   Collection Time: 08/08/14 11:54 AM  Result Value Ref Range   Glucose-Capillary 154 (H) 70 - 99 mg/dL   Comment 1 Notify RN   Glucose, capillary     Status: Abnormal   Collection Time: 08/08/14  4:41 PM  Result Value Ref Range   Glucose-Capillary 229 (H) 70 - 99 mg/dL   Comment 1 Notify RN   Wound culture     Status: None (Preliminary result)   Collection Time: 08/08/14  8:41 PM  Result Value Ref Range   Specimen Description ABSCESS    Special Requests NONE    Gram Stain PENDING    Culture NO GROWTH < 24 HOURS    Report Status PENDING   Glucose, capillary     Status: Abnormal   Collection Time: 08/08/14  9:10 PM  Result Value Ref Range    Glucose-Capillary 214 (H) 70 - 99 mg/dL  Glucose, capillary     Status: Abnormal   Collection Time: 08/09/14  7:52 AM  Result Value Ref Range   Glucose-Capillary 190 (H) 70 - 99 mg/dL   Comment 1 Notify RN   CBC     Status: Abnormal   Collection Time: 08/09/14  9:29  AM  Result Value Ref Range   WBC 8.7 3.8 - 10.6 K/uL   RBC 3.02 (L) 4.40 - 5.90 MIL/uL   Hemoglobin 7.6 (L) 13.0 - 18.0 g/dL   HCT 24.5 (L) 40.0 - 52.0 %   MCV 81.1 80.0 - 100.0 fL   MCH 25.1 (L) 26.0 - 34.0 pg   MCHC 31.0 (L) 32.0 - 36.0 g/dL   RDW 15.2 (H) 11.5 - 14.5 %   Platelets 403 150 - 440 K/uL  Glucose, capillary     Status: Abnormal   Collection Time: 08/09/14 11:27 AM  Result Value Ref Range   Glucose-Capillary 204 (H) 70 - 99 mg/dL   Comment 1 Notify RN    No components found for: ESR, C REACTIVE PROTEIN MICRO: Recent Results (from the past 240 hour(s))  Culture, blood (routine x 2)     Status: None (Preliminary result)   Collection Time: 08/06/14  9:52 PM  Result Value Ref Range Status   Specimen Description BLOOD  Final   Special Requests NONE  Final   Culture NO GROWTH 3 DAYS  Final   Report Status PENDING  Incomplete  Urine culture     Status: None   Collection Time: 08/06/14  9:55 PM  Result Value Ref Range Status   Specimen Description URINE, CLEAN CATCH  Final   Special Requests NONE  Final   Culture   Final    MULTIPLE SPECIES PRESENT, SUGGEST RECOLLECTION IF CLINICALLY INDICATED   Report Status 08/08/2014 FINAL  Final  Culture, blood (routine x 2)     Status: None (Preliminary result)   Collection Time: 08/07/14  2:53 AM  Result Value Ref Range Status   Specimen Description BLOOD  Final   Special Requests NONE  Final   Culture NO GROWTH 2 DAYS  Final   Report Status PENDING  Incomplete  Wound culture     Status: None (Preliminary result)   Collection Time: 08/08/14  8:41 PM  Result Value Ref Range Status   Specimen Description ABSCESS  Final   Special Requests NONE  Final   Gram  Stain PENDING  Incomplete   Culture NO GROWTH < 24 HOURS  Final   Report Status PENDING  Incomplete   IMAGING: US Renal  08/08/2014   CLINICAL DATA:  Renal failure.  Followup left renal mass.  EXAM: RENAL / URINARY TRACT ULTRASOUND COMPLETE  COMPARISON:  CT, 07/21/2014.  FINDINGS: Right Kidney:  Length: 12.2 cm. Borderline increased parenchymal echogenicity. No mass, stone or hydronephrosis.  Left Kidney:  Length: 11.4 cm. Normal parenchymal echogenicity. Mass with internal echoes but no convincing internal blood flow protrudes from the midpole of the left kidney measuring 3.3 cm x 2.9 cm x 3.1 cm. No hydronephrosis.  Bladder:  Appears normal for degree of bladder distention.  IMPRESSION: 1. No hydronephrosis.  No acute finding. 2. 3.3 cm left midpole exophytic renal mass. This could be a solid mass although no convincing blood flow was seen within this. Is more likely a complicated left renal cyst. Recommend either short-term followup with repeat ultrasound in 3-4 months to document stability or further characterization with renal MRI with and without contrast.   Electronically Signed   By: Lajean Manes M.D.   On: 08/08/2014 10:44   Mr Ankle Right  Wo Contrast  08/08/2014   CLINICAL DATA:  Increasing right ankle swelling and pain for 2 or 3 weeks. History of osteomyelitis with multiple prior surgeries. No recent injury. Initial encounter.  EXAM:  MRI OF THE RIGHT ANKLE WITHOUT CONTRAST  TECHNIQUE: Multiplanar, multisequence MR imaging of the ankle was performed. No intravenous contrast was administered.  COMPARISON:  Radiographs 08/07/2014.  FINDINGS: The study is markedly abnormal. Patient is status post resection of the distal fibula and tibia. There is near complete absence of the talus and navicular with large erosions involving the superior aspect of the calcaneal body. There is a large complex fluid collection surrounding the distal tibia, measuring 7.6 x 8.2 x 8.0 cm. There are multiple foci of  susceptibility artifact within this complex fluid collection, likely postsurgical. In correlation with the radiographs, there are multiple associated calcifications along the periphery of this fluid collection. There is a 1.8 cm calcific density medially.  TENDONS  Peroneal: Poorly visualized distally on the axial and coronal images. The tendons may be intact based on the sagittal images.  Posteromedial: The posterior tibialis tendon appears attenuated distally, but grossly intact. The other medial flexor tendons appear intact.  Anterior: Intact.  Achilles: Intact with mild diffuse tendinosis.  Plantar Fascia: Intact.  LIGAMENTS  Lateral: Not applicable given previous surgery.  Medial: Not applicable given previous surgery.  CARTILAGE  Ankle Joint: Not applicable given previous surgery and findings above.  Subtalar Joints/Sinus Tarsi: Not applicable given previous surgery and findings above.  Bones: Postsurgical findings are discussed above. There are large irregular erosions involving the superior aspect of the calcaneal body adjacent to the fluid collection. Screw defects are present within the calcaneus and distal tibia. There is retained hardware within the distal tibia as correlated with prior radiographs. There is mild fragmentation of the cuboid and cuneiform bones which appears longstanding without associated marrow edema.  IMPRESSION: 1. Extensive abnormalities as described above, likely due to a combination of postsurgical change and chronic osteomyelitis. 2. Large complex fluid collection surrounding the resected distal tibia, potentially infected. Fluid sampling recommended. 3. Chronic osteomyelitis within the distal tibia is likely. No specific evidence of acute osteomyelitis within the visualized foot.   Electronically Signed   By: Richardean Sale M.D.   On: 08/08/2014 12:48    HISTORICAL MICRO/IMAGING June 2015 - culture at [facility] negative staph and Staph hemolyticus ESR was  53  Assessment/Plan:  49 y.o. male with a history of DM, charcot deformity of R foot as well as recurrent infection of the R foot admitted 5/7 with fevers, foot pain and swelling and redness.  He has had issues with recurrent infection in the R leg for 7-8 years due to charcot deformity.  Previously followed in [place] where he first had surgery in 2014.  He moved to [facility] and was following with Podiatry in [place] where last summer he had removal of hardware, ? Bone grafting as well as 6 weeks IV abx (he does not know which abx or what organisms were isolated).  He has been on suppressive doxycycline since July 2015 and was relatively well untuil about a month ago when he felt a :"pop" in that leg. Then developed worsening swelling, redness and some pain. Started having fevers as well.   Since admit has been seen by Dr Cleda Mccreedy and had MRI showing fluid collection and osteomyelitis. Dr Cleda Mccreedy performed aspiration of 20 cc of bloody purulent fluid.  Recommendations Cont vanco - renal dosed Will almost certainly need surgery whether debridement and drainage or BKA If BKA is not performed will need prolonged IV abx with PICC line placement- hesitant to place Picc given renal disease unless absolutely needed.  If BKA  is done will probably be able to just give oral abx. Check ESR CRP Thank you for the consult.

## 2014-08-10 LAB — CREATININE, SERUM
Creatinine, Ser: 5.72 mg/dL — ABNORMAL HIGH (ref 0.61–1.24)
GFR calc Af Amer: 12 mL/min — ABNORMAL LOW (ref >60)
GFR calc non Af Amer: 11 mL/min — ABNORMAL LOW (ref >60)

## 2014-08-10 LAB — CBC
HCT: 23.4 % — ABNORMAL LOW (ref 40.0–52.0)
Hemoglobin: 7.5 g/dL — ABNORMAL LOW (ref 13.0–18.0)
MCH: 25.7 pg — ABNORMAL LOW (ref 26.0–34.0)
MCHC: 31.9 g/dL — ABNORMAL LOW (ref 32.0–36.0)
MCV: 80.3 fL (ref 80.0–100.0)
Platelets: 428 10*3/uL (ref 150–440)
RBC: 2.92 MIL/uL — ABNORMAL LOW (ref 4.40–5.90)
RDW: 15.6 % — ABNORMAL HIGH (ref 11.5–14.5)
WBC: 9.8 10*3/uL (ref 3.8–10.6)

## 2014-08-10 LAB — GLUCOSE, CAPILLARY
Glucose-Capillary: 135 mg/dL — ABNORMAL HIGH (ref 70–99)
Glucose-Capillary: 149 mg/dL — ABNORMAL HIGH (ref 70–99)
Glucose-Capillary: 141 mg/dL — ABNORMAL HIGH (ref 70–99)
Glucose-Capillary: 148 mg/dL — ABNORMAL HIGH (ref 70–99)

## 2014-08-10 LAB — VANCOMYCIN, TROUGH: Vancomycin Tr: 29 ug/mL (ref 10–20)

## 2014-08-10 LAB — SEDIMENTATION RATE: Sed Rate: 127 mm/hr — ABNORMAL HIGH (ref 0–15)

## 2014-08-10 LAB — C-REACTIVE PROTEIN: CRP: 8.9 mg/dL — ABNORMAL HIGH (ref <1.0)

## 2014-08-10 MED ORDER — BENZONATATE 100 MG PO CAPS
100.0000 mg | ORAL_CAPSULE | Freq: Three times a day (TID) | ORAL | Status: DC | PRN
  Administered 2014-08-10: 100 mg via ORAL
  Filled 2014-08-10 (×2): qty 1

## 2014-08-10 NOTE — Progress Notes (Signed)
ANTIBIOTIC CONSULT NOTE - FOLLOW UP  Pharmacy Consult for  Vancomycin Indication: Osteomyelitis  No Known Allergies  Patient Measurements: Height: 5\' 8"  (172.7 cm) Weight: 258 lb (117.028 kg) IBW/kg (Calculated) : 68.4 Adjusted Body Weight: 89 kg  Vital Signs: Temp: 97.7 F (36.5 C) (05/11 0816) Temp Source: Oral (05/11 0816) BP: 123/93 mmHg (05/11 0816) Pulse Rate: 91 (05/11 0816) Intake/Output from previous day: 05/10 0701 - 05/11 0700 In: 720 [P.O.:720] Out: 1250 [Urine:1250] Intake/Output from this shift: Total I/O In: 360 [P.O.:360] Out: -   Labs:  Recent Labs  08/09/14 0929 08/10/14 0535  WBC 8.7 9.8  HGB 7.6* 7.5*  PLT 403 428  CREATININE  --  5.72*   Estimated Creatinine Clearance: 19.4 mL/min (by C-G formula based on Cr of 5.72).  Recent Labs  08/10/14 0532  VANCOTROUGH 29*     Microbiology: Recent Results (from the past 720 hour(s))  Culture, blood (routine x 2)     Status: None (Preliminary result)   Collection Time: 08/06/14  9:52 PM  Result Value Ref Range Status   Specimen Description BLOOD  Final   Special Requests NONE  Final   Culture NO GROWTH 3 DAYS  Final   Report Status PENDING  Incomplete  Urine culture     Status: None   Collection Time: 08/06/14  9:55 PM  Result Value Ref Range Status   Specimen Description URINE, CLEAN CATCH  Final   Special Requests NONE  Final   Culture   Final    MULTIPLE SPECIES PRESENT, SUGGEST RECOLLECTION IF CLINICALLY INDICATED   Report Status 08/08/2014 FINAL  Final  Culture, blood (routine x 2)     Status: None (Preliminary result)   Collection Time: 08/07/14  2:53 AM  Result Value Ref Range Status   Specimen Description BLOOD  Final   Special Requests NONE  Final   Culture NO GROWTH 2 DAYS  Final   Report Status PENDING  Incomplete  Wound culture     Status: None (Preliminary result)   Collection Time: 08/08/14  8:41 PM  Result Value Ref Range Status   Specimen Description ABSCESS  Final   Special Requests NONE  Final   Gram Stain   Final    FEW WBC SEEN RARE GRAM NEGATIVE RODS RARE GRAM POSITIVE COCCI IN PAIRS    Culture NO GROWTH < 24 HOURS  Final   Report Status PENDING  Incomplete    Anti-infectives    Start     Dose/Rate Route Frequency Ordered Stop   08/07/14 2300  vancomycin (VANCOCIN) 1,250 mg in sodium chloride 0.9 % 250 mL IVPB     1,250 mg 166.7 mL/hr over 90 Minutes Intravenous Every 36 hours 08/07/14 0748     08/07/14 1000  doxycycline (VIBRA-TABS) tablet 100 mg  Status:  Discontinued     100 mg Oral 2 times daily 08/07/14 0626 08/07/14 1438   08/07/14 0900  vancomycin (VANCOCIN) 1,250 mg in sodium chloride 0.9 % 250 mL IVPB     1,250 mg 166.7 mL/hr over 90 Minutes Intravenous  Once 08/07/14 0748 08/07/14 1250   08/07/14 0630  vancomycin (VANCOCIN) IVPB 1000 mg/200 mL premix  Status:  Discontinued     1,000 mg 200 mL/hr over 60 Minutes Intravenous Every 24 hours 08/07/14 0626 08/07/14 0745   08/07/14 0245  ceFAZolin (ANCEF) IVPB 1 g/50 mL premix     1 g 100 mL/hr over 30 Minutes Intravenous  Once 08/07/14 0231 08/07/14 0412   08/07/14  0215  ceFAZolin (ANCEF) injection 1 g  Status:  Discontinued     1 g Intramuscular  Once 08/07/14 0214 08/07/14 0231      Assessment: 49 yo male with Osteomyelitis. ID consulted Goal of Therapy:  Vancomycin trough level 15-20 mcg/ml  Plan:  Kei 0.022 hr-1  t1/2 32 hours TBW 117kg  IBW 68.4kg  DW 88kg  Vd 62L 1250 mg x1 and then q 36 hours after stacked dose. Level ordered for 22:00, 5/11 before 4th dose. Goal trough 15-20 for osteomyelitis.  5/11: Vanc trough drawn at incorrect time (=29). Level drawn 18 hrs after last dose, patient on Q36h schedule. Originally scheduled for Tonight 5/11 at 2200. Will reorder trough for tonight 5/11 at 2330.  Chinita Greenland PharmD Clinical Pharmacist 08/10/2014

## 2014-08-10 NOTE — Progress Notes (Signed)
Inpatient Diabetes Program Recommendations  AACE/ADA: New Consensus Statement on Inpatient Glycemic Control (2013)  Target Ranges:  Prepandial:   less than 140 mg/dL      Peak postprandial:   less than 180 mg/dL (1-2 hours)      Critically ill patients:  140 - 180 mg/dL   Results for TASMAN, EDELMAN (MRN [id number]) as of 08/10/2014 10:36  Ref. Range 08/09/2014 07:52 08/09/2014 11:27 08/09/2014 16:24 08/09/2014 21:18 08/10/2014 07:25  Glucose-Capillary Latest Ref Range: 70-99 mg/dL 190 (H) 204 (H) 188 (H) 198 (H) 135 (H)   Diabetes history: DM2 Outpatient Diabetes medications: Lantus 75 units daily Current orders for Inpatient glycemic control: Lantus 75 units daily  Inpatient Diabetes Program Recommendations Correction (SSI): Glucose has ranged from 135-204 mg/dl over the past 24 hours and patient would benefit from Novolog correction.  Please consider using Glycemic Control Order Set and order CBGs with Novolog sensitive correction ACHS.  Thanks, Barnie Alderman, RN, MSN, CCRN, CDE Diabetes Coordinator Inpatient Diabetes Program [phone] (Team Pager from [place] to [place]) [phone] ([facility] office) [phone] [facility] office)

## 2014-08-10 NOTE — Care Management Note (Signed)
Case Management Note  Patient Details  Name: Oval Littrel MRN: [id number] Date of Birth: [birth year]  Subjective/Objective:                  Patient sitting in bedside recliner appearing to be in prayer listening to music. He invited this RNCM to come into the room. He matter-of-fact said that he "was going to see he he can be discharged today". "I did not come to this hospital to have my leg cut off and I plan to take my leg with me when I leave". "I don't want them putting any of those chemicals in my body either". He wants to be treated with PO medications- he refused a PICC line.   Action/Plan: This RNCM spoke with patient regarding risks of not receiving recommended treatment but that his choice was respected.    Expected Discharge Date:                  Expected Discharge Plan:     In-House Referral:     Discharge planning Services  CM Consult  Post Acute Care Choice:    Choice offered to:  Patient  DME Arranged:  N/A DME Agency:     HH Arranged:  NA HH Agency:     Status of Service:  Completed, signed off  Medicare Important Message Given:  Yes Date Medicare IM Given:  08/01/14 Medicare IM give by:  Marshell Garfinkel Date Additional Medicare IM Given:    Additional Medicare Important Message give by:     If discussed at [place] of Stay Meetings, dates discussed:    Additional Comments:  Marshell Garfinkel, RN 08/10/2014, 9:33 AM

## 2014-08-10 NOTE — Progress Notes (Signed)
Subjective:   Patient known to our practice from outpatient. He is followed for CKD st 5 admitted for evaluation of fever. Found to have fluid collection / abscess in rt foot Outpatient U.A and CXR were clear BUN/CR are critically high  No N/V Abscess aspirated  No acute c/o  Objective:  Vital signs in last 24 hours:  Temp:  [97.7 F (36.5 C)-98.9 F (37.2 C)] 98.4 F (36.9 C) (05/11 1523) Pulse Rate:  [69-91] 70 (05/11 1523) Resp:  [18] 18 (05/11 1523) BP: (120-142)/(56-93) 123/93 mmHg (05/11 0816) SpO2:  [93 %-100 %] 97 % (05/11 1523)  Weight change:  Filed Weights   08/06/14 2131  Weight: 117.028 kg (258 lb)    Intake/Output: I/O last 3 completed shifts: In: 77 [P.O.:720] Out: 1600 [Urine:1600]   Intake/Output this shift:  Total I/O In: 600 [P.O.:600] Out: -   Physical Exam: General: NAD,   Head: Normocephalic, atraumatic. Moist oral mucosal membranes  Eyes: Anicteric,  Neck: Supple, trachea midline  Lungs:  Clear to auscultation  Heart: Regular rate and rhythm  Abdomen:  Soft, nontender,   Extremities:  + peripheral edema.  Neurologic: Nonfocal, moving all four extremities  Skin: No lesions  Access: AVF    Basic Metabolic Panel:  Recent Labs Lab 08/06/14 2152 08/07/14 0647 08/10/14 0535  NA 137  --   --   K 4.7  --   --   CL 100*  --   --   CO2 23  --   --   GLUCOSE 127*  --   --   BUN 114*  --   --   CREATININE 5.29* 5.43* 5.72*  CALCIUM 8.5*  --   --     Liver Function Tests:  Recent Labs Lab 08/06/14 2152  AST 22  ALT 19  ALKPHOS 114  BILITOT 0.4  PROT 7.3  ALBUMIN 3.2*   No results for input(s): LIPASE, AMYLASE in the last 168 hours. No results for input(s): AMMONIA in the last 168 hours.  CBC:  Recent Labs Lab 08/06/14 2152 08/07/14 0647 08/09/14 0929 08/10/14 0535  WBC 13.2* 12.4* 8.7 9.8  NEUTROABS 10.7*  --   --   --   HGB 8.4* 8.1* 7.6* 7.5*  HCT 26.7* 26.3* 24.5* 23.4*  MCV 80.2 80.7 81.1 80.3  PLT 393  394 403 428    Cardiac Enzymes: No results for input(s): CKTOTAL, CKMB, CKMBINDEX, TROPONINI in the last 168 hours.  BNP: Invalid input(s): POCBNP  CBG:  Recent Labs Lab 08/09/14 1127 08/09/14 1624 08/09/14 2118 08/10/14 0725 08/10/14 1125  GLUCAP 204* 188* 198* 135* 149*    Microbiology: Results for orders placed or performed during the hospital encounter of 08/06/14  Culture, blood (routine x 2)     Status: None (Preliminary result)   Collection Time: 08/06/14  9:52 PM  Result Value Ref Range Status   Specimen Description BLOOD  Final   Special Requests NONE  Final   Culture NO GROWTH 4 DAYS  Final   Report Status PENDING  Incomplete  Urine culture     Status: None   Collection Time: 08/06/14  9:55 PM  Result Value Ref Range Status   Specimen Description URINE, CLEAN CATCH  Final   Special Requests NONE  Final   Culture   Final    MULTIPLE SPECIES PRESENT, SUGGEST RECOLLECTION IF CLINICALLY INDICATED   Report Status 08/08/2014 FINAL  Final  Culture, blood (routine x 2)     Status: None (Preliminary  result)   Collection Time: 08/07/14  2:53 AM  Result Value Ref Range Status   Specimen Description BLOOD  Final   Special Requests NONE  Final   Culture NO GROWTH 3 DAYS  Final   Report Status PENDING  Incomplete  Wound culture     Status: None (Preliminary result)   Collection Time: 08/08/14  8:41 PM  Result Value Ref Range Status   Specimen Description ABSCESS  Final   Special Requests NONE  Final   Gram Stain   Final    FEW WBC SEEN RARE GRAM NEGATIVE RODS RARE GRAM POSITIVE COCCI IN PAIRS    Culture HOLDING FOR POSSIBLE PATHOGEN  Final   Report Status PENDING  Incomplete    Coagulation Studies: No results for input(s): LABPROT, INR in the last 72 hours.  Urinalysis: No results for input(s): COLORURINE, LABSPEC, PHURINE, GLUCOSEU, HGBUR, BILIRUBINUR, KETONESUR, PROTEINUR, UROBILINOGEN, NITRITE, LEUKOCYTESUR in the last 72 hours.  Invalid input(s):  APPERANCEUR    Imaging: No results found.   Medications:     . allopurinol  100 mg Oral Daily  . olmesartan  40 mg Oral Daily   And  . amLODipine  10 mg Oral Daily   And  . hydrochlorothiazide  25 mg Oral Daily  . aspirin  81 mg Oral Daily  . carvedilol  25 mg Oral BID  . cloNIDine  0.1 mg Oral BID  . ferrous sulfate  325 mg Oral Daily  . heparin subcutaneous  5,000 Units Subcutaneous 3 times per day  . insulin glargine  75 Units Subcutaneous Daily  . levothyroxine  100 mcg Oral QAC breakfast  . sodium bicarbonate  650 mg Oral BID  . vancomycin  1,250 mg Intravenous Q36H   acetaminophen **OR** acetaminophen, benzonatate, morphine injection, ondansetron **OR** ondansetron (ZOFRAN) IV  Assessment/ Plan:  49 y.o. male with CKD st 5, HTN, AOCKD, DM-2 with complications, Hypothyroidism, Hyperuricemia, right foot osteomyelitis, left renal mass , OSA  1. CKD st 5 with mild uremic symptoms but critically elevated BUN/CR - electrolytes and volume status are acceptable. No acute indication for HD but patient is close. - will monitor closely 2. Left renal mass- outpatient work up is in progress with [facility] 3. AOCKD - EPO on hold because of renal mass 4. Rt foot osteo- evaluation under progress. Aspirated on 5/09. Currently treated with iv vancomycin. Follow levels Can be dosed iq Q 4-5 days since vancomycin clearance will be low due to renal insufficiency      LOS: 3 Carmita Boom 5/11/20164:38 PM

## 2014-08-10 NOTE — Progress Notes (Addendum)
Patient's VSS this shift. Rt foot stabilizer used as well as crutches. Rt foot has extreme edema. Has rt wrist fistula but refused dialysis at this time. Pt is refusing picc line or amputation. Pt has DM II uses lantus but has no sliding scale coverage.  Refuses IV abx. Plan is to d/c home with wife tomorrow with oral abx.

## 2014-08-10 NOTE — Progress Notes (Signed)
Date of Admission:  08/06/2014     ID: Tim Becker is a 49 y.o. male with  Chronic osteomyelitis  Principal Problem:   Osteomyelitis of ankle or foot, right, acute Active Problems:   Type 2 diabetes mellitus with diabetic neuropathy   Essential hypertension   Obesity   Hypothyroidism   Subjective: NO fevers, still with some swelling pain. Wants to go home   Medications:  . allopurinol  100 mg Oral Daily  . olmesartan  40 mg Oral Daily   And  . amLODipine  10 mg Oral Daily   And  . hydrochlorothiazide  25 mg Oral Daily  . aspirin  81 mg Oral Daily  . carvedilol  25 mg Oral BID  . cloNIDine  0.1 mg Oral BID  . ferrous sulfate  325 mg Oral Daily  . heparin subcutaneous  5,000 Units Subcutaneous 3 times per day  . insulin glargine  75 Units Subcutaneous Daily  . levothyroxine  100 mcg Oral QAC breakfast  . sodium bicarbonate  650 mg Oral BID  . vancomycin  1,250 mg Intravenous Q36H    Objective: Vital signs in last 24 hours: Temp:  [97.7 F (36.5 C)-98.9 F (37.2 C)] 97.7 F (36.5 C) (05/11 0816) Pulse Rate:  [69-91] 91 (05/11 0816) Resp:  [18] 18 (05/11 0816) BP: (120-142)/(56-93) 123/93 mmHg (05/11 0816) SpO2:  [93 %-100 %] 93 % (05/11 0816)  GENERAL:currently in no acute distress.  HEAD: Normocephalic, atraumatic.  EYES: Pupils equal, round, reactive to light. Extraocular muscles intact. No scleral icterus.  MOUTH: Moist mucosal membrane. Dentition intact. No abscess noted.  EAR, NOSE, THROAT: Clear without exudates. No external lesions.  NECK: Supple. No thyromegaly. No nodules. No JVD.  PULMONARY: Clear to ascultation, without wheeze rails or rhonci. No use of accessory muscles, Good respiratory effort. good air entry bilaterally CHEST: Nontender to palpation.  CARDIOVASCULAR: S1 and S2. Regular rate and rhythm. No murmurs, rubs, or gallops. No edema. Pedal pulses 2+ bilaterally Ext - RLE with multiple scars, warm, swollen, mildly tender.   Lab  Results  Recent Labs  08/09/14 0929 08/10/14 0535  WBC 8.7 9.8  HGB 7.6* 7.5*  HCT 24.5* 23.4*  CREATININE  --  5.72*   Liver Panel Recent Labs  08/10/14 0534  ESRSEDRATE 127*   C-Reactive Protein pending Microbiology: Results for orders placed or performed during the hospital encounter of 08/06/14  Culture, blood (routine x 2)     Status: None (Preliminary result)   Collection Time: 08/06/14  9:52 PM  Result Value Ref Range Status   Specimen Description BLOOD  Final   Special Requests NONE  Final   Culture NO GROWTH 4 DAYS  Final   Report Status PENDING  Incomplete  Urine culture     Status: None   Collection Time: 08/06/14  9:55 PM  Result Value Ref Range Status   Specimen Description URINE, CLEAN CATCH  Final   Special Requests NONE  Final   Culture   Final    MULTIPLE SPECIES PRESENT, SUGGEST RECOLLECTION IF CLINICALLY INDICATED   Report Status 08/08/2014 FINAL  Final  Culture, blood (routine x 2)     Status: None (Preliminary result)   Collection Time: 08/07/14  2:53 AM  Result Value Ref Range Status   Specimen Description BLOOD  Final   Special Requests NONE  Final   Culture NO GROWTH 3 DAYS  Final   Report Status PENDING  Incomplete  Wound culture     Status:  None (Preliminary result)   Collection Time: 08/08/14  8:41 PM  Result Value Ref Range Status   Specimen Description ABSCESS  Final   Special Requests NONE  Final   Gram Stain   Final    FEW WBC SEEN RARE GRAM NEGATIVE RODS RARE GRAM POSITIVE COCCI IN PAIRS    Culture HOLDING FOR POSSIBLE PATHOGEN  Final   Report Status PENDING  Incomplete   Studies/Results: No results found.   Assessment/Plan: 49 y.o. male with a history of DM, charcot deformity of R foot as well as recurrent infection of the R foot admitted 5/7 with fevers, foot pain and swelling and redness. He has had issues with recurrent infection in the R leg for 7-8 years due to charcot deformity. Previously followed in [place] where he  first had surgery in 2014. He moved to [facility] and was following with Podiatry in [place] where last summer he had removal of hardware, ? Bone grafting as well as 6 weeks IV abx (he does not know which abx or what organisms were isolated). He has been on suppressive doxycycline since July 2015 and was relatively well untuil about a month ago when he felt a :"pop" in that leg. Then developed worsening swelling, redness and some pain. Started having fevers as well. Since admit has been seen by Dr Cleda Mccreedy and had MRI showing fluid collection and osteomyelitis. Dr Cleda Mccreedy performed aspiration of 20 cc of bloody purulent fluid. Culture is pending. ESR 127.  Recommendations Cont vanco - renal dosed - for now Will almost certainly need surgery whether debridement and drainage or BKA.  He wishes to follow up with podiatry at [facility]. If BKA is not performed we discussed the possibility of  prolonged IV abx with PICC line placement- However he and renal are  hesitant to place Picc given renal disease unless absolutely needed.  If BKA is done will probably be able to just give oral abx.  At this point will try to suggest oral regimen based on pending culture so patient can be dced in next 1-2 days and fu with podiatry for more definitive surgery Tim Becker   08/10/2014, 3:11 PM

## 2014-08-10 NOTE — Progress Notes (Signed)
  Subjective: Patient seen. States he had to change his bandage yesterday. No significant complaints. States he is not ready to consider any type of surgery for his right leg including below-knee amputation. States he is hoping to be able to go home on either oral antibiotics or possibly a PICC line if indicated.  Objective: Vital signs in last 24 hours: Temp:  [97.7 F (36.5 C)-98.9 F (37.2 C)] 97.7 F (36.5 C) (05/11 0816) Pulse Rate:  [69-91] 91 (05/11 0816) Resp:  [18] 18 (05/11 0816) BP: (120-142)/(56-93) 123/93 mmHg (05/11 0816) SpO2:  [93 %-100 %] 93 % (05/11 0816) Last BM Date: 08/08/14  Intake/Output from previous day: 05/10 0701 - 05/11 0700 In: 720 [P.O.:720] Out: 1250 [Urine:1250] Intake/Output this shift: Total I/O In: 600 [P.O.:600] Out: -   Still significant instability at the right rear foot at the nonunion site. No evidence of any abscess or drainage from the aspiration site. Significant increased skin temperature to the entire right rear foot and distal leg.  Lab Results:   Recent Labs  08/09/14 0929 08/10/14 0535  WBC 8.7 9.8  HGB 7.6* 7.5*  HCT 24.5* 23.4*  PLT 403 428   BMET  Recent Labs  08/10/14 0535  CREATININE 5.72*   PT/INR No results for input(s): LABPROT, INR in the last 72 hours. ABG No results for input(s): PHART, HCO3 in the last 72 hours.  Invalid input(s): PCO2, PO2  Studies/Results: No results found.  Anti-infectives: Anti-infectives    Start     Dose/Rate Route Frequency Ordered Stop   08/07/14 2300  vancomycin (VANCOCIN) 1,250 mg in sodium chloride 0.9 % 250 mL IVPB     1,250 mg 166.7 mL/hr over 90 Minutes Intravenous Every 36 hours 08/07/14 0748     08/07/14 1000  doxycycline (VIBRA-TABS) tablet 100 mg  Status:  Discontinued     100 mg Oral 2 times daily 08/07/14 0626 08/07/14 1438   08/07/14 0900  vancomycin (VANCOCIN) 1,250 mg in sodium chloride 0.9 % 250 mL IVPB     1,250 mg 166.7 mL/hr over 90 Minutes  Intravenous  Once 08/07/14 0748 08/07/14 1250   08/07/14 0630  vancomycin (VANCOCIN) IVPB 1000 mg/200 mL premix  Status:  Discontinued     1,000 mg 200 mL/hr over 60 Minutes Intravenous Every 24 hours 08/07/14 0626 08/07/14 0745   08/07/14 0245  ceFAZolin (ANCEF) IVPB 1 g/50 mL premix     1 g 100 mL/hr over 30 Minutes Intravenous  Once 08/07/14 0231 08/07/14 0412   08/07/14 0215  ceFAZolin (ANCEF) injection 1 g  Status:  Discontinued     1 g Intramuscular  Once 08/07/14 0214 08/07/14 0231      Assessment/Plan: s/p * No surgery found * Assessment: 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   Plan: The dressing from the aspiration was removed. No need for repeat bandaging. Discussed with the patient that at this point as long as he is stable it sounds reasonable to allow him to go home as long as he is on some type of long-term antibiotics, pending the results of his culture as long as it is still negative. I would recommend follow-up with Dr. Prudy Feeler in [facility] who is previously performed his surgery. We will follow the patient accordingly if He continues to remain in the hospital.  LOS: 3 days    Martena Emanuele W. 08/10/2014

## 2014-08-10 NOTE — Progress Notes (Signed)
Subjective: Sitting up in a chair. No c/o pain. Right foot boot in place. Mild dry cough. Afebrile. No SOB, chest pain or abdominal pain.   Objective: Vital signs in last 24 hours: Temp:  [97.7 F (36.5 C)-98.9 F (37.2 C)] 97.7 F (36.5 C) (05/11 0816) Pulse Rate:  [69-91] 91 (05/11 0816) Resp:  [18] 18 (05/11 0816) BP: (120-142)/(56-93) 123/93 mmHg (05/11 0816) SpO2:  [93 %-100 %] 93 % (05/11 0816) Weight change:  Last BM Date: 08/08/14  Intake/Output from previous day: 05/10 0701 - 05/11 0700 In: 720 [P.O.:720] Out: 1250 [Urine:1250] Intake/Output this shift: Total I/O In: 600 [P.O.:600] Out: -   General appearance: alert, cooperative and no distress Head: Normocephalic, without obvious abnormality, atraumatic Neck: no JVD, supple, symmetrical, trachea midline and thyroid not enlarged Resp: clear to auscultation bilaterally Cardio: regular rate and rhythm, S1, S2 normal, no murmur, rub or gallop GI: soft, non-tender; no masses,  no organomegaly Extremities: right lower leg boot in place, no edema of left lower leg  Neurologic: Alert and oriented X 3, moving all extremities.  Lab Results:  Recent Labs  08/09/14 0929 08/10/14 0535  WBC 8.7 9.8  HGB 7.6* 7.5*  HCT 24.5* 23.4*  PLT 403 428   BMET  Recent Labs  08/10/14 0535  CREATININE 5.72*    Studies/Results: No results found.  Medications:  Scheduled: . allopurinol  100 mg Oral Daily  . olmesartan  40 mg Oral Daily   And  . amLODipine  10 mg Oral Daily   And  . hydrochlorothiazide  25 mg Oral Daily  . aspirin  81 mg Oral Daily  . carvedilol  25 mg Oral BID  . cloNIDine  0.1 mg Oral BID  . ferrous sulfate  325 mg Oral Daily  . heparin subcutaneous  5,000 Units Subcutaneous 3 times per day  . insulin glargine  75 Units Subcutaneous Daily  . levothyroxine  100 mcg Oral QAC breakfast  . sodium bicarbonate  650 mg Oral BID  . vancomycin  1,250 mg Intravenous Q36H    Assessment/Plan: Chronic  osteomyelitis of right ankle and abscess in right foot. MRI results showed a large fluid collection around the distal tibia and calcaneal region with evidence for bony osteomyelitis. Fluid sampling was performed by podiatry yesterday. Prelim culture is negative. Afebrile. No leukocytosis. Continue Vancomycin. Patient refused PICC line for continued IV antibiotic therapy as out patient. He is agreeable to taking oral antibiotics. He is not ready for amputation at this time. Shall await ID recommendation for out-patient therapy.  Left renal mass: noted on renal u/s, no hydronephrosis. Patient will need MRI with contrast and follow up with his urologist.  Stage 5 CKD, closely followed by nephrology.  Discharge planning. Prepare for possible discharge tomorrow.     LOS: 3 days   Tim Becker 08/10/2014, 1:35 PM

## 2014-08-11 LAB — GLUCOSE, CAPILLARY
Glucose-Capillary: 79 mg/dL (ref 65–99)
Glucose-Capillary: 118 mg/dL — ABNORMAL HIGH (ref 65–99)
Glucose-Capillary: 142 mg/dL — ABNORMAL HIGH (ref 65–99)
Glucose-Capillary: 143 mg/dL — ABNORMAL HIGH (ref 65–99)

## 2014-08-11 LAB — CULTURE, BLOOD (ROUTINE X 2): Culture: NO GROWTH

## 2014-08-11 LAB — VANCOMYCIN, TROUGH: Vancomycin Tr: 25 ug/mL (ref 10–20)

## 2014-08-11 MED ORDER — VANCOMYCIN HCL 10 G IV SOLR
1250.0000 mg | INTRAVENOUS | Status: DC
  Filled 2014-08-11: qty 1250

## 2014-08-11 NOTE — Care Management Note (Signed)
I will continue to monitor the patient for possible need of out patient dialysis.

## 2014-08-11 NOTE — Progress Notes (Signed)
Subjective:   Patient known to our practice from outpatient. He is followed for CKD st 5 admitted for evaluation of fever. Found to have fluid collection / abscess in rt foot Outpatient U.A and CXR were clear BUN/CR are critically high  No N/V Abscess aspirated  No acute c/o  Objective:  Vital signs in last 24 hours:  Temp:  [98.1 F (36.7 C)-98.8 F (37.1 C)] 98.2 F (36.8 C) (05/12 1522) Pulse Rate:  [72-77] 74 (05/12 1522) Resp:  [17-20] 18 (05/12 1204) BP: (131-158)/(66-80) 135/69 mmHg (05/12 1522) SpO2:  [97 %-100 %] 99 % (05/12 1522)  Weight change:  Filed Weights   08/06/14 2131  Weight: 117.028 kg (258 lb)    Intake/Output: I/O last 3 completed shifts: In: 1080 [P.O.:1080] Out: 1150 [Urine:1150]   Intake/Output this shift:  Total I/O In: 960 [P.O.:960] Out: 1450 [Urine:1450]  Physical Exam: General: NAD,   Head: Normocephalic, atraumatic. Moist oral mucosal membranes  Eyes: Anicteric,  Neck: Supple, trachea midline  Lungs:  Clear to auscultation  Heart: Regular rate and rhythm  Abdomen:  Soft, nontender,   Extremities:  + peripheral edema.  Neurologic: Nonfocal, moving all four extremities  Skin: No lesions  Access: AVF    Basic Metabolic Panel:  Recent Labs Lab 08/06/14 2152 08/07/14 0647 08/10/14 0535  NA 137  --   --   K 4.7  --   --   CL 100*  --   --   CO2 23  --   --   GLUCOSE 127*  --   --   BUN 114*  --   --   CREATININE 5.29* 5.43* 5.72*  CALCIUM 8.5*  --   --     Liver Function Tests:  Recent Labs Lab 08/06/14 2152  AST 22  ALT 19  ALKPHOS 114  BILITOT 0.4  PROT 7.3  ALBUMIN 3.2*   No results for input(s): LIPASE, AMYLASE in the last 168 hours. No results for input(s): AMMONIA in the last 168 hours.  CBC:  Recent Labs Lab 08/06/14 2152 08/07/14 0647 08/09/14 0929 08/10/14 0535  WBC 13.2* 12.4* 8.7 9.8  NEUTROABS 10.7*  --   --   --   HGB 8.4* 8.1* 7.6* 7.5*  HCT 26.7* 26.3* 24.5* 23.4*  MCV 80.2 80.7  81.1 80.3  PLT 393 394 403 428    Cardiac Enzymes: No results for input(s): CKTOTAL, CKMB, CKMBINDEX, TROPONINI in the last 168 hours.  BNP: Invalid input(s): POCBNP  CBG:  Recent Labs Lab 08/10/14 1652 08/10/14 1955 08/11/14 0757 08/11/14 1207 08/11/14 1630  GLUCAP 141* 148* 20 118* 142*    Microbiology: Results for orders placed or performed during the hospital encounter of 08/06/14  Culture, blood (routine x 2)     Status: None   Collection Time: 08/06/14  9:52 PM  Result Value Ref Range Status   Specimen Description BLOOD  Final   Special Requests NONE  Final   Culture NO GROWTH 5 DAYS  Final   Report Status 08/11/2014 FINAL  Final  Urine culture     Status: None   Collection Time: 08/06/14  9:55 PM  Result Value Ref Range Status   Specimen Description URINE, CLEAN CATCH  Final   Special Requests NONE  Final   Culture   Final    MULTIPLE SPECIES PRESENT, SUGGEST RECOLLECTION IF CLINICALLY INDICATED   Report Status 08/08/2014 FINAL  Final  Culture, blood (routine x 2)     Status: None (Preliminary result)  Collection Time: 08/07/14  2:53 AM  Result Value Ref Range Status   Specimen Description BLOOD  Final   Special Requests NONE  Final   Culture NO GROWTH 4 DAYS  Final   Report Status PENDING  Incomplete  Wound culture     Status: None (Preliminary result)   Collection Time: 08/08/14  8:41 PM  Result Value Ref Range Status   Specimen Description ABSCESS  Final   Special Requests NONE  Final   Gram Stain   Final    FEW WBC SEEN RARE GRAM NEGATIVE RODS RARE GRAM POSITIVE COCCI IN PAIRS    Culture   Final    RARE GRAM NEGATIVE RODS IDENTIFICATION TO FOLLOW    Report Status PENDING  Incomplete    Coagulation Studies: No results for input(s): LABPROT, INR in the last 72 hours.  Urinalysis: No results for input(s): COLORURINE, LABSPEC, PHURINE, GLUCOSEU, HGBUR, BILIRUBINUR, KETONESUR, PROTEINUR, UROBILINOGEN, NITRITE, LEUKOCYTESUR in the last 72  hours.  Invalid input(s): APPERANCEUR    Imaging: No results found.   Medications:     . allopurinol  100 mg Oral Daily  . olmesartan  40 mg Oral Daily   And  . amLODipine  10 mg Oral Daily   And  . hydrochlorothiazide  25 mg Oral Daily  . aspirin  81 mg Oral Daily  . carvedilol  25 mg Oral BID  . cloNIDine  0.1 mg Oral BID  . ferrous sulfate  325 mg Oral Daily  . heparin subcutaneous  5,000 Units Subcutaneous 3 times per day  . insulin glargine  75 Units Subcutaneous Daily  . levothyroxine  100 mcg Oral QAC breakfast  . sodium bicarbonate  650 mg Oral BID  . [START ON 08/12/2014] vancomycin  1,250 mg Intravenous Q72H   acetaminophen **OR** acetaminophen, benzonatate, morphine injection, ondansetron **OR** ondansetron (ZOFRAN) IV  Assessment/ Plan:  49 y.o. male with CKD st 5, HTN, AOCKD, DM-2 with complications, Hypothyroidism, Hyperuricemia, right foot osteomyelitis, left renal mass , OSA  1. CKD st 5 with mild uremic symptoms but critically elevated BUN/CR - electrolytes and volume status are acceptable. No acute indication for HD but patient is close. - will monitor closely 2. Left renal mass- outpatient work up is in progress with [facility] 3. AOCKD - EPO on hold because of renal mass 4. Rt foot osteo- evaluation under progress. Aspirated on 5/09. Growing rare gram neg rods Currently treated with iv vancomycin. Follow levels Can be dosed iq Q 4-5 days since vancomycin clearance will be low due to renal insufficiency      LOS: 4 Abrianna Sidman 5/12/20165:31 PM

## 2014-08-11 NOTE — Progress Notes (Signed)
Subjective: Sitting up in bed. No c/o pain. Right leg boot in place. Afebrile. No SOB, chest pain or abdominal pain.   Objective: Vital signs in last 24 hours: Temp:  [98.1 F (36.7 C)-98.8 F (37.1 C)] 98.3 F (36.8 C) (05/12 1204) Pulse Rate:  [70-77] 73 (05/12 1204) Resp:  [17-20] 18 (05/12 1204) BP: (131-158)/(66-80) 131/66 mmHg (05/12 1204) SpO2:  [97 %-100 %] 100 % (05/12 1204) Weight change:  Last BM Date: 08/10/14  Intake/Output from previous day: 05/11 0701 - 05/12 0700 In: 1080 [P.O.:1080] Out: 300 [Urine:300] Intake/Output this shift: Total I/O In: 240 [P.O.:240] Out: 900 [Urine:900]  General appearance: alert, cooperative and no distress Head: Normocephalic, without obvious abnormality, atraumatic Neck: no JVD, supple, symmetrical, trachea midline and thyroid not enlarged Resp: clear to auscultation bilaterally Cardio: regular rate and rhythm, S1, S2 normal, no murmur, rub or gallop GI: soft, non-tender; no masses,  no organomegaly Extremities: right lower leg boot in place, no edema of left lower leg  Neurologic: Alert and oriented X 3, moving all extremities.  Lab Results:  Recent Labs  08/09/14 0929 08/10/14 0535  WBC 8.7 9.8  HGB 7.6* 7.5*  HCT 24.5* 23.4*  PLT 403 428   BMET  Recent Labs  08/10/14 0535  CREATININE 5.72*    Studies/Results: No results found.  Medications:  Scheduled: . allopurinol  100 mg Oral Daily  . olmesartan  40 mg Oral Daily   And  . amLODipine  10 mg Oral Daily   And  . hydrochlorothiazide  25 mg Oral Daily  . aspirin  81 mg Oral Daily  . carvedilol  25 mg Oral BID  . cloNIDine  0.1 mg Oral BID  . ferrous sulfate  325 mg Oral Daily  . heparin subcutaneous  5,000 Units Subcutaneous 3 times per day  . insulin glargine  75 Units Subcutaneous Daily  . levothyroxine  100 mcg Oral QAC breakfast  . sodium bicarbonate  650 mg Oral BID  . [START ON 08/12/2014] vancomycin  1,250 mg Intravenous Q72H     Assessment/Plan: Chronic osteomyelitis of right ankle and abscess in right foot. MRI results showed a large fluid collection around the distal tibia and calcaneal region with evidence for bony osteomyelitis. Fluid sampling was performed by podiatry. Prelim culture is negative. Afebrile. No leukocytosis. Continue Vancomycin. Patient refused PICC line for continued IV antibiotic therapy as out patient. He is agreeable to taking oral antibiotics. He is not ready for amputation at this time. Shall await ID recommendation for oral antibiotic therapy as out-patient.  Left renal mass: noted on renal u/s, no hydronephrosis. Patient will need MRI with contrast and follow up with his urologist.  Stage 5 CKD, closely followed by nephrology.  Discharge planning. Await ID recommendations. Prepare for possible discharge tomorrow.     LOS: 4 days   Tim Becker 08/11/2014, 1:31 PM

## 2014-08-11 NOTE — Progress Notes (Signed)
ANTIBIOTIC CONSULT NOTE - FOLLOW UP  Pharmacy Consult for Vancomycin Indication: Osteomyelitis  No Known Allergies  Patient Measurements: Height: 5\' 8"  (172.7 cm) Weight: 258 lb (117.028 kg) IBW/kg (Calculated) : 68.4 Adjusted Body Weight: 89  Vital Signs: Temp: 98.8 F (37.1 C) (05/11 1953) Temp Source: Oral (05/11 1953) BP: 158/80 mmHg (05/11 1953) Pulse Rate: 77 (05/11 1953) Intake/Output from previous day: 05/11 0701 - 05/12 0700 In: 1080 [P.O.:1080] Out: -  Intake/Output from this shift:    Labs:  Recent Labs  08/09/14 0929 08/10/14 0535  WBC 8.7 9.8  HGB 7.6* 7.5*  PLT 403 428  CREATININE  --  5.72*   Estimated Creatinine Clearance: 19.4 mL/min (by C-G formula based on Cr of 5.72).  Recent Labs  08/10/14 0532 08/10/14 2229  VANCOTROUGH 29* 25*     Microbiology: Recent Results (from the past 720 hour(s))  Culture, blood (routine x 2)     Status: None (Preliminary result)   Collection Time: 08/06/14  9:52 PM  Result Value Ref Range Status   Specimen Description BLOOD  Final   Special Requests NONE  Final   Culture NO GROWTH 4 DAYS  Final   Report Status PENDING  Incomplete  Urine culture     Status: None   Collection Time: 08/06/14  9:55 PM  Result Value Ref Range Status   Specimen Description URINE, CLEAN CATCH  Final   Special Requests NONE  Final   Culture   Final    MULTIPLE SPECIES PRESENT, SUGGEST RECOLLECTION IF CLINICALLY INDICATED   Report Status 08/08/2014 FINAL  Final  Culture, blood (routine x 2)     Status: None (Preliminary result)   Collection Time: 08/07/14  2:53 AM  Result Value Ref Range Status   Specimen Description BLOOD  Final   Special Requests NONE  Final   Culture NO GROWTH 3 DAYS  Final   Report Status PENDING  Incomplete  Wound culture     Status: None (Preliminary result)   Collection Time: 08/08/14  8:41 PM  Result Value Ref Range Status   Specimen Description ABSCESS  Final   Special Requests NONE  Final   Gram Stain   Final    FEW WBC SEEN RARE GRAM NEGATIVE RODS RARE GRAM POSITIVE COCCI IN PAIRS    Culture HOLDING FOR POSSIBLE PATHOGEN  Final   Report Status PENDING  Incomplete    Anti-infectives    Start     Dose/Rate Route Frequency Ordered Stop   08/07/14 2300  vancomycin (VANCOCIN) 1,250 mg in sodium chloride 0.9 % 250 mL IVPB  Status:  Discontinued     1,250 mg 166.7 mL/hr over 90 Minutes Intravenous Every 36 hours 08/07/14 0748 08/11/14 0249   08/07/14 1000  doxycycline (VIBRA-TABS) tablet 100 mg  Status:  Discontinued     100 mg Oral 2 times daily 08/07/14 0626 08/07/14 1438   08/07/14 0900  vancomycin (VANCOCIN) 1,250 mg in sodium chloride 0.9 % 250 mL IVPB     1,250 mg 166.7 mL/hr over 90 Minutes Intravenous  Once 08/07/14 0748 08/07/14 1250   08/07/14 0630  vancomycin (VANCOCIN) IVPB 1000 mg/200 mL premix  Status:  Discontinued     1,000 mg 200 mL/hr over 60 Minutes Intravenous Every 24 hours 08/07/14 0626 08/07/14 0745   08/07/14 0245  ceFAZolin (ANCEF) IVPB 1 g/50 mL premix     1 g 100 mL/hr over 30 Minutes Intravenous  Once 08/07/14 0231 08/07/14 0412   08/07/14 0215  ceFAZolin (ANCEF) injection 1 g  Status:  Discontinued     1 g Intramuscular  Once 08/07/14 0214 08/07/14 0231      Assessment: Vancomycin trough is above goal. True ke is 0.0087. T1/2 is 79 h.    Goal of Therapy:  Vancomycin trough level 15-20 mcg/ml  Plan:  Will redose vancomycin at 1250 mg iv q 72 h from when level should be 17.5 based on calculated kinetics. May need to dose by levels in this patient with CKD on the verge of dialysis per renal note.    Ulice Dash D 08/11/2014,2:51 AM

## 2014-08-11 NOTE — Progress Notes (Signed)
Patient resting quietly most of the day. VSS this shift and pt w/o complaints. Possible discharge tomorrow.

## 2014-08-11 NOTE — Progress Notes (Deleted)
Pt admitted for hypertension. C/O left side weakness and unsteadiness. No skin issues noted. States he could not afford medications so he has not taken any medication since December. Wife at bedside

## 2014-08-12 LAB — GLUCOSE, CAPILLARY
Glucose-Capillary: 130 mg/dL — ABNORMAL HIGH (ref 65–99)
Glucose-Capillary: 167 mg/dL — ABNORMAL HIGH (ref 65–99)

## 2014-08-12 LAB — CULTURE, BLOOD (ROUTINE X 2): Culture: NO GROWTH

## 2014-08-12 MED ORDER — ACETAMINOPHEN 325 MG PO TABS: 650.0000 mg | ORAL_TABLET | Freq: Three times a day (TID) | ORAL | Status: DC

## 2014-08-12 MED ORDER — VANCOMYCIN HCL IN DEXTROSE 1-5 GM/200ML-% IV SOLN
1000.0000 mg | INTRAVENOUS | Status: DC
  Filled 2014-08-12: qty 200

## 2014-08-12 MED ORDER — CIPROFLOXACIN HCL 250 MG PO TABS: 250.0000 mg | ORAL_TABLET | Freq: Two times a day (BID) | ORAL | Status: DC

## 2014-08-12 NOTE — Discharge Summary (Signed)
Physician Discharge Summary  Patient ID: Tim Becker MRN: [id number] DOB/AGE: 12-06-1965 49 y.o.  Admit date: 08/06/2014 Discharge date: 08/12/2014  Admission Diagnoses: Osteomyelitis of right ankle  Discharge Diagnoses:  Principal Problem:   Chronic osteomyelitis of right ankle and abscess in right foot. Active Problems:   Type 2 diabetes mellitus with diabetic neuropathy   Essential hypertension   Obesity   Hypothyroidism   Discharged Condition: satisfactory  Hospital Course: 49 y.o. male with a history of DM, charcot deformity of R foot and recurrent infection of the R foot. Patient was admitted with fevers, foot pain, swelling and redness.h/o recurrent infection in the R leg for 7-8 years due to charcot deformity. Patient was previously followed in [place] where he first had surgery in 2014. After he moved to [place] he was following with Podiatry in [facility], where last summer he had removal of hardware and 6 weeks IV abx. He has been on suppressive doxycycline since July 2015. Patient received IV Vancomycin as in-patient. MRI revealed a large fluid collection around the distal tibia and calcaneal region with evidence for bony osteomyelitis. Fluid sampling was performed by podiatry. Prelim culture is rare gram negative rod. Patient refused PICC line for continued IV antibiotic therapy as out patient. He is agreeable to taking oral antibiotics. He is not ready for amputation at this time. Shall discharge home on Doxycycline 100 mg BID and Ciprofloxacin 250 mg BID per ID recommendation. Patient has a follow up appointment with Podiatry in [facility]. Left renal mass: noted on renal u/s, no hydronephrosis. Patient will need MRI with contrast and follow up with his urologist. Stage 5 CKD, stable closely followed by nephrology. Patient has scheduled a follow up appointment with his podiatrist in [facility],    Consults: ID, nephrology and podiatry   Discharge Exam: Blood pressure  144/64, pulse 76, temperature 98.3 F (36.8 C), temperature source Oral, resp. rate 18, height 5\' 8"  (1.727 m), weight 117.028 kg (258 lb), SpO2 95 %.  General appearance: alert, cooperative and no distress Head: Normocephalic, without obvious abnormality, atraumatic Neck: no JVD, supple, symmetrical, trachea midline and thyroid not enlarged Resp: clear to auscultation bilaterally Cardio: regular rate and rhythm, S1, S2 normal, no murmur, rub or gallop GI: soft, non-tender; no masses, no organomegaly Extremities: right lower leg boot in place, no edema of left lower leg  Neurologic: Alert and oriented X 3, moving all extremities  Disposition: Final discharge disposition not confirmed     Medication List    ASK your doctor about these medications        allopurinol 100 MG tablet  Commonly known as:  ZYLOPRIM  Take 1 tablet by mouth daily.     aspirin 81 MG chewable tablet  Chew 1 tablet by mouth daily.     carvedilol 25 MG tablet  Commonly known as:  COREG  Take 1 tablet by mouth 2 (two) times daily.     cloNIDine 0.1 MG tablet  Commonly known as:  CATAPRES  Take 1 tablet by mouth 2 (two) times daily.     doxycycline 100 MG tablet  Commonly known as:  VIBRA-TABS  Take 1 tablet by mouth 2 (two) times daily.     ferrous sulfate 325 (65 FE) MG tablet  Take 1 tablet by mouth daily.     Fish Oil 1000 MG Caps  Take 2,000 mg by mouth daily.     furosemide 40 MG tablet  Commonly known as:  LASIX  Take 1  tablet by mouth daily.     LANTUS SOLOSTAR 100 UNIT/ML Solostar Pen  Generic drug:  Insulin Glargine  Inject 75 Units into the skin daily.     levothyroxine 100 MCG tablet  Commonly known as:  SYNTHROID, LEVOTHROID  Take 1 tablet by mouth daily.     sodium bicarbonate 650 MG tablet  Take 1 tablet by mouth 2 (two) times daily.     TRIBENZOR 40-10-25 MG Tabs  Generic drug:  Olmesartan-Amlodipine-HCTZ  Take 1 tablet by mouth daily.     vitamin C 500 MG tablet   Commonly known as:  ASCORBIC ACID  Take 1,500 mg by mouth daily.         Signed: Cellie Dardis 08/12/2014, 1:28 PM

## 2014-08-12 NOTE — Progress Notes (Signed)
RD Follow Up:   Clinical Update: Patient eating well.   Current diet: Heart healthy/ carb modified Typical Food/ Fluid Intake: 100% intake recorded per I/O  Current Weight: 258# Weight Changes: No weight changes noted   Labs: Electrolyte and Renal Profile:    Recent Labs Lab 08/06/14 2152 08/07/14 0647 08/10/14 0535  BUN 114*  --   --   CREATININE 5.29* 5.43* 5.72*  NA 137  --   --   K 4.7  --   --    Protein Profile:  Recent Labs Lab 08/06/14 2152  ALBUMIN 3.2*   Glucose Profile:  Recent Labs  08/11/14 1630 08/11/14 2122 08/12/14 0749  GLUCAP 142* 143* 130*     Meds: Reviewed Physical Findings: n/a Skin: Reviewed  UOP: Reviewed Digestive: Last BM 5/12  PES: Inadequate oral intake related to acute illness improved as evidenced by 100% intake of meals recorded per I/O  Interventions: Meals and Snacks: Cater to patient preferences   Monitoring/ Evaluation: Energy Intake: goal for patient to meet >90% of estimated needs.- goal currently being met  Roda Shutters, RDN Pager: [phone] Office: [place]

## 2014-08-12 NOTE — Progress Notes (Signed)
ANTIBIOTIC CONSULT NOTE - FOLLOW UP  Pharmacy Consult for Vancomycin Indication: Osteomyelitis  No Known Allergies  Patient Measurements: Height: 5\' 8"  (172.7 cm) Weight: 258 lb (117.028 kg) IBW/kg (Calculated) : 68.4 Adjusted Body Weight: 89  Vital Signs: Temp: 98.4 F (36.9 C) (05/13 0751) Temp Source: Oral (05/13 0751) BP: 132/66 mmHg (05/13 0751) Pulse Rate: 71 (05/13 0751) Intake/Output from previous day: 05/12 0701 - 05/13 0700 In: 1080 [P.O.:1080] Out: 2450 [Urine:2450] Intake/Output from this shift:    Labs:  Recent Labs  08/09/14 0929 08/10/14 0535  WBC 8.7 9.8  HGB 7.6* 7.5*  PLT 403 428  CREATININE  --  5.72*   Estimated Creatinine Clearance: 19.4 mL/min (by C-G formula based on Cr of 5.72).  Recent Labs  08/10/14 0532 08/10/14 2229  VANCOTROUGH 29* 25*     Microbiology: Recent Results (from the past 720 hour(s))  Culture, blood (routine x 2)     Status: None   Collection Time: 08/06/14  9:52 PM  Result Value Ref Range Status   Specimen Description BLOOD  Final   Special Requests NONE  Final   Culture NO GROWTH 5 DAYS  Final   Report Status 08/11/2014 FINAL  Final  Urine culture     Status: None   Collection Time: 08/06/14  9:55 PM  Result Value Ref Range Status   Specimen Description URINE, CLEAN CATCH  Final   Special Requests NONE  Final   Culture   Final    MULTIPLE SPECIES PRESENT, SUGGEST RECOLLECTION IF CLINICALLY INDICATED   Report Status 08/08/2014 FINAL  Final  Culture, blood (routine x 2)     Status: None   Collection Time: 08/07/14  2:53 AM  Result Value Ref Range Status   Specimen Description BLOOD  Final   Special Requests NONE  Final   Culture NO GROWTH 5 DAYS  Final   Report Status 08/12/2014 FINAL  Final  Wound culture     Status: None (Preliminary result)   Collection Time: 08/08/14  8:41 PM  Result Value Ref Range Status   Specimen Description ABSCESS  Final   Special Requests NONE  Final   Gram Stain   Final     FEW WBC SEEN RARE GRAM NEGATIVE RODS RARE GRAM POSITIVE COCCI IN PAIRS    Culture   Final    RARE GRAM NEGATIVE RODS IDENTIFICATION TO FOLLOW    Report Status PENDING  Incomplete    Anti-infectives    Start     Dose/Rate Route Frequency Ordered Stop   08/12/14 1430  vancomycin (VANCOCIN) 1,250 mg in sodium chloride 0.9 % 250 mL IVPB  Status:  Discontinued     1,250 mg 166.7 mL/hr over 90 Minutes Intravenous every 72 hours 08/11/14 0301 08/12/14 0827   08/12/14 1400  vancomycin (VANCOCIN) IVPB 1000 mg/200 mL premix     1,000 mg 200 mL/hr over 60 Minutes Intravenous every 72 hours 08/12/14 0827     08/07/14 2300  vancomycin (VANCOCIN) 1,250 mg in sodium chloride 0.9 % 250 mL IVPB  Status:  Discontinued     1,250 mg 166.7 mL/hr over 90 Minutes Intravenous Every 36 hours 08/07/14 0748 08/11/14 0249   08/07/14 1000  doxycycline (VIBRA-TABS) tablet 100 mg  Status:  Discontinued     100 mg Oral 2 times daily 08/07/14 0626 08/07/14 1438   08/07/14 0900  vancomycin (VANCOCIN) 1,250 mg in sodium chloride 0.9 % 250 mL IVPB     1,250 mg 166.7 mL/hr over 90  Minutes Intravenous  Once 08/07/14 0748 08/07/14 1250   08/07/14 0630  vancomycin (VANCOCIN) IVPB 1000 mg/200 mL premix  Status:  Discontinued     1,000 mg 200 mL/hr over 60 Minutes Intravenous Every 24 hours 08/07/14 0626 08/07/14 0745   08/07/14 0245  ceFAZolin (ANCEF) IVPB 1 g/50 mL premix     1 g 100 mL/hr over 30 Minutes Intravenous  Once 08/07/14 0231 08/07/14 0412   08/07/14 0215  ceFAZolin (ANCEF) injection 1 g  Status:  Discontinued     1 g Intramuscular  Once 08/07/14 0214 08/07/14 0231      Assessment: 49 yo male with Osteomyelitis on Vancomycin. Patient borderline for HD, however patient does not want HD. Per Nephrology note- consider dosing Vancomycin every 4-5 days with current renal fxn  Goal of Therapy:  Vancomycin trough level 15-20 mcg/ml  5/11:Vancomycin trough = 25 (above goal) True ke is 0.0087. T1/2 is 79 h.     Plan:  Will continue vancomycin at 1000 mg IV q 72 h from when level should be 17.5 based on calculated kinetics. May need to dose by levels in this patient with CKD on the verge of dialysis per renal note. Nect dose due today 5/13 at 1400. Will order next "trough" for 5/16 at 1330.  Chinita Greenland PharmD Clinical Pharmacist 08/12/2014

## 2014-08-12 NOTE — Progress Notes (Signed)
Subjective:   Patient known to our practice from outpatient. He is followed for CKD st 5 admitted for evaluation of fever. Found to have fluid collection / abscess in rt foot Outpatient U.A and CXR were clear BUN/CR are critically high  No N/V Abscess aspirated - results of sensitivities awaited No acute c/o  Objective:  Vital signs in last 24 hours:  Temp:  [98.2 F (36.8 C)-98.6 F (37 C)] 98.3 F (36.8 C) (05/13 1121) Pulse Rate:  [66-76] 76 (05/13 1121) Resp:  [18-20] 18 (05/13 1121) BP: (115-144)/(59-69) 144/64 mmHg (05/13 1121) SpO2:  [95 %-99 %] 95 % (05/13 1121)  Weight change:  Filed Weights   08/06/14 2131  Weight: 117.028 kg (258 lb)    Intake/Output: I/O last 3 completed shifts: In: 1080 [P.O.:1080] Out: 2750 [Urine:2750]   Intake/Output this shift:  Total I/O In: 240 [P.O.:240] Out: 330 [Urine:330]  Physical Exam: General: NAD,   Head: Normocephalic, atraumatic. Moist oral mucosal membranes  Eyes: Anicteric,  Neck: Supple, trachea midline  Lungs:  Clear to auscultation  Heart: Regular rate and rhythm  Abdomen:  Soft, nontender,   Extremities:  + peripheral edema.  Neurologic: Nonfocal, moving all four extremities  Skin: No lesions  Access: AVF- developing    Basic Metabolic Panel:  Recent Labs Lab 08/06/14 2152 08/07/14 0647 08/10/14 0535  NA 137  --   --   K 4.7  --   --   CL 100*  --   --   CO2 23  --   --   GLUCOSE 127*  --   --   BUN 114*  --   --   CREATININE 5.29* 5.43* 5.72*  CALCIUM 8.5*  --   --     Liver Function Tests:  Recent Labs Lab 08/06/14 2152  AST 22  ALT 19  ALKPHOS 114  BILITOT 0.4  PROT 7.3  ALBUMIN 3.2*   No results for input(s): LIPASE, AMYLASE in the last 168 hours. No results for input(s): AMMONIA in the last 168 hours.  CBC:  Recent Labs Lab 08/06/14 2152 08/07/14 0647 08/09/14 0929 08/10/14 0535  WBC 13.2* 12.4* 8.7 9.8  NEUTROABS 10.7*  --   --   --   HGB 8.4* 8.1* 7.6* 7.5*  HCT  26.7* 26.3* 24.5* 23.4*  MCV 80.2 80.7 81.1 80.3  PLT 393 394 403 428    Cardiac Enzymes: No results for input(s): CKTOTAL, CKMB, CKMBINDEX, TROPONINI in the last 168 hours.  BNP: Invalid input(s): POCBNP  CBG:  Recent Labs Lab 08/11/14 1207 08/11/14 1630 08/11/14 2122 08/12/14 0749 08/12/14 1119  GLUCAP 118* 142* 143* 130* 167*    Microbiology: Results for orders placed or performed during the hospital encounter of 08/06/14  Culture, blood (routine x 2)     Status: None   Collection Time: 08/06/14  9:52 PM  Result Value Ref Range Status   Specimen Description BLOOD  Final   Special Requests NONE  Final   Culture NO GROWTH 5 DAYS  Final   Report Status 08/11/2014 FINAL  Final  Urine culture     Status: None   Collection Time: 08/06/14  9:55 PM  Result Value Ref Range Status   Specimen Description URINE, CLEAN CATCH  Final   Special Requests NONE  Final   Culture   Final    MULTIPLE SPECIES PRESENT, SUGGEST RECOLLECTION IF CLINICALLY INDICATED   Report Status 08/08/2014 FINAL  Final  Culture, blood (routine x 2)  Status: None   Collection Time: 08/07/14  2:53 AM  Result Value Ref Range Status   Specimen Description BLOOD  Final   Special Requests NONE  Final   Culture NO GROWTH 5 DAYS  Final   Report Status 08/12/2014 FINAL  Final  Wound culture     Status: None (Preliminary result)   Collection Time: 08/08/14  8:41 PM  Result Value Ref Range Status   Specimen Description ABSCESS  Final   Special Requests NONE  Final   Gram Stain   Final    FEW WBC SEEN RARE GRAM NEGATIVE RODS RARE GRAM POSITIVE COCCI IN PAIRS    Culture   Final    RARE GRAM NEGATIVE RODS REPEATING ID AND SENSITIVITIES    Report Status PENDING  Incomplete    Coagulation Studies: No results for input(s): LABPROT, INR in the last 72 hours.  Urinalysis: No results for input(s): COLORURINE, LABSPEC, PHURINE, GLUCOSEU, HGBUR, BILIRUBINUR, KETONESUR, PROTEINUR, UROBILINOGEN, NITRITE,  LEUKOCYTESUR in the last 72 hours.  Invalid input(s): APPERANCEUR    Imaging: No results found.   Medications:     . allopurinol  100 mg Oral Daily  . olmesartan  40 mg Oral Daily   And  . amLODipine  10 mg Oral Daily   And  . hydrochlorothiazide  25 mg Oral Daily  . aspirin  81 mg Oral Daily  . carvedilol  25 mg Oral BID  . cloNIDine  0.1 mg Oral BID  . ferrous sulfate  325 mg Oral Daily  . heparin subcutaneous  5,000 Units Subcutaneous 3 times per day  . insulin glargine  75 Units Subcutaneous Daily  . levothyroxine  100 mcg Oral QAC breakfast  . sodium bicarbonate  650 mg Oral BID  . vancomycin  1,000 mg Intravenous Q72H   acetaminophen **OR** acetaminophen, benzonatate, morphine injection, ondansetron **OR** ondansetron (ZOFRAN) IV  Assessment/ Plan:  49 y.o. male with CKD st 5, HTN, AOCKD, DM-2 with complications, Hypothyroidism, Hyperuricemia, right foot osteomyelitis, left renal mass , OSA  1. CKD st 5 with mild uremic symptoms but critically elevated BUN/CR - electrolytes and volume status are acceptable. No acute indication for HD but patient is close. - will monitor closely 2. Left renal mass- outpatient work up is in progress with [facility] 3. AOCKD - EPO on hold because of renal mass 4. Rt foot osteo- evaluation under progress. Aspirated on 5/09. Growing rare gram neg rods Currently treated with iv vancomycin. Follow levels VANCOMYCIN Can be dosed iv Q 5-7 days in renal failure based on levels      LOS: 5 Tereza Gilham 5/13/20161:08 PM

## 2014-08-12 NOTE — Progress Notes (Signed)
Discharge Note:  D/c home. Instructions given to pt, verbalized understanding. Wheeled out by volunteers, family providing transportation.

## 2014-08-14 LAB — WOUND CULTURE

## 2014-08-31 DIAGNOSIS — Z89519 Acquired absence of unspecified leg below knee: Secondary | ICD-10-CM

## 2014-08-31 HISTORY — DX: Acquired absence of unspecified leg below knee: Z89.519

## 2014-09-06 DIAGNOSIS — N189 Chronic kidney disease, unspecified: Secondary | ICD-10-CM | POA: Insufficient documentation

## 2014-09-06 DIAGNOSIS — D631 Anemia in chronic kidney disease: Secondary | ICD-10-CM

## 2014-09-08 DIAGNOSIS — Z89519 Acquired absence of unspecified leg below knee: Secondary | ICD-10-CM | POA: Insufficient documentation

## 2014-09-13 DIAGNOSIS — I953 Hypotension of hemodialysis: Secondary | ICD-10-CM | POA: Insufficient documentation

## 2014-09-14 DIAGNOSIS — K8020 Calculus of gallbladder without cholecystitis without obstruction: Secondary | ICD-10-CM | POA: Insufficient documentation

## 2014-12-06 ENCOUNTER — Encounter
Admission: RE | Disposition: A | Discharge status: Home / Self Care | Payer: Self-pay | Source: Ambulatory Visit | Attending: Vascular Surgery

## 2014-12-06 ENCOUNTER — Other Ambulatory Visit: Payer: Self-pay | Admitting: Vascular Surgery

## 2014-12-06 ENCOUNTER — Ambulatory Visit
Admission: RE | Admit: 2014-12-06 | Discharge: 2014-12-06 | Disposition: A | Discharge status: Home / Self Care | Payer: Medicare Other | Source: Ambulatory Visit | Attending: Vascular Surgery | Admitting: Vascular Surgery

## 2014-12-06 ENCOUNTER — Encounter: Payer: Self-pay | Admitting: *Deleted

## 2014-12-06 DIAGNOSIS — E039 Hypothyroidism, unspecified: Secondary | ICD-10-CM | POA: Insufficient documentation

## 2014-12-06 DIAGNOSIS — D631 Anemia in chronic kidney disease: Secondary | ICD-10-CM | POA: Diagnosis not present

## 2014-12-06 DIAGNOSIS — E1122 Type 2 diabetes mellitus with diabetic chronic kidney disease: Secondary | ICD-10-CM | POA: Diagnosis not present

## 2014-12-06 DIAGNOSIS — Z794 Long term (current) use of insulin: Secondary | ICD-10-CM | POA: Diagnosis not present

## 2014-12-06 DIAGNOSIS — Y832 Surgical operation with anastomosis, bypass or graft as the cause of abnormal reaction of the patient, or of later complication, without mention of misadventure at the time of the procedure: Secondary | ICD-10-CM | POA: Insufficient documentation

## 2014-12-06 DIAGNOSIS — I120 Hypertensive chronic kidney disease with stage 5 chronic kidney disease or end stage renal disease: Secondary | ICD-10-CM | POA: Diagnosis not present

## 2014-12-06 DIAGNOSIS — Z992 Dependence on renal dialysis: Secondary | ICD-10-CM | POA: Diagnosis not present

## 2014-12-06 DIAGNOSIS — N186 End stage renal disease: Secondary | ICD-10-CM | POA: Insufficient documentation

## 2014-12-06 DIAGNOSIS — T82868A Thrombosis of vascular prosthetic devices, implants and grafts, initial encounter: Secondary | ICD-10-CM | POA: Insufficient documentation

## 2014-12-06 DIAGNOSIS — Z79899 Other long term (current) drug therapy: Secondary | ICD-10-CM | POA: Diagnosis not present

## 2014-12-06 HISTORY — PX: PERIPHERAL VASCULAR CATHETERIZATION: SHX172C

## 2014-12-06 LAB — POTASSIUM (ARMC VASCULAR LAB ONLY): Potassium (ARMC vascular lab): 4.4

## 2014-12-06 SURGERY — A/V SHUNTOGRAM/FISTULAGRAM
Anesthesia: Moderate Sedation | Laterality: Left

## 2014-12-06 MED ORDER — FENTANYL CITRATE (PF) 100 MCG/2ML IJ SOLN
INTRAMUSCULAR | Status: DC | PRN
  Administered 2014-12-06 (×4): 50 ug via INTRAVENOUS

## 2014-12-06 MED ORDER — HEPARIN SODIUM (PORCINE) 1000 UNIT/ML IJ SOLN
INTRAMUSCULAR | Status: DC | PRN
Start: 2014-12-06 — End: 2014-12-06
  Administered 2014-12-06: 4000 [IU] via INTRAVENOUS

## 2014-12-06 MED ORDER — SODIUM CHLORIDE 0.9 % IV SOLN
INTRAVENOUS | Status: DC
  Administered 2014-12-06: 11:00:00 via INTRAVENOUS

## 2014-12-06 MED ORDER — OXYCODONE HCL 5 MG PO TABS: 5.0000 mg | ORAL_TABLET | ORAL | Status: DC | PRN

## 2014-12-06 MED ORDER — ACETAMINOPHEN 325 MG PO TABS: 325.0000 mg | ORAL_TABLET | ORAL | Status: DC | PRN

## 2014-12-06 MED ORDER — ACETAMINOPHEN 325 MG RE SUPP: 325.0000 mg | RECTAL | Status: DC | PRN

## 2014-12-06 MED ORDER — CEFAZOLIN SODIUM 1-5 GM-% IV SOLN
INTRAVENOUS | Status: AC
  Filled 2014-12-06: qty 50

## 2014-12-06 MED ORDER — MIDAZOLAM HCL 5 MG/5ML IJ SOLN
INTRAMUSCULAR | Status: AC
  Filled 2014-12-06: qty 5

## 2014-12-06 MED ORDER — MIDAZOLAM HCL 2 MG/2ML IJ SOLN
INTRAMUSCULAR | Status: DC | PRN
  Administered 2014-12-06: 2 mg via INTRAVENOUS
  Administered 2014-12-06 (×3): 1 mg via INTRAVENOUS

## 2014-12-06 MED ORDER — CEFUROXIME SODIUM 1.5 G IJ SOLR
1.5000 g | INTRAMUSCULAR | Status: AC
  Administered 2014-12-06: 1.5 g via INTRAVENOUS
  Filled 2014-12-06: qty 1.5

## 2014-12-06 MED ORDER — FENTANYL CITRATE (PF) 100 MCG/2ML IJ SOLN
INTRAMUSCULAR | Status: AC
  Filled 2014-12-06: qty 2

## 2014-12-06 MED ORDER — HEPARIN (PORCINE) IN NACL 2-0.9 UNIT/ML-% IJ SOLN
INTRAMUSCULAR | Status: AC
  Filled 2014-12-06: qty 1000

## 2014-12-06 MED ORDER — LIDOCAINE HCL (PF) 1 % IJ SOLN
INTRAMUSCULAR | Status: AC
  Filled 2014-12-06: qty 10

## 2014-12-06 MED ORDER — INSULIN ASPART 100 UNIT/ML ~~LOC~~ SOLN: 0.0000 [IU] | SUBCUTANEOUS | Status: DC

## 2014-12-06 MED ORDER — HEPARIN SODIUM (PORCINE) 1000 UNIT/ML IJ SOLN
INTRAMUSCULAR | Status: AC
Start: 2014-12-06 — End: 2014-12-06
  Filled 2014-12-06: qty 1

## 2014-12-06 MED ORDER — HYDROMORPHONE HCL 1 MG/ML IJ SOLN: 0.5000 mg | INTRAMUSCULAR | Status: DC | PRN

## 2014-12-06 MED ORDER — ONDANSETRON HCL 4 MG/2ML IJ SOLN: 4.0000 mg | Freq: Four times a day (QID) | INTRAMUSCULAR | Status: DC | PRN

## 2014-12-06 MED ORDER — CHLORHEXIDINE GLUCONATE CLOTH 2 % EX PADS: 6.0000 | MEDICATED_PAD | Freq: Once | CUTANEOUS | Status: DC

## 2014-12-06 MED ORDER — LIDOCAINE HCL (PF) 1 % IJ SOLN
INTRAMUSCULAR | Status: DC | PRN
  Administered 2014-12-06: 5 mL via INTRADERMAL

## 2014-12-06 SURGICAL SUPPLY — 17 items
BALLN DORADO 7X60X80 (BALLOONS) ×2
BALLOON DORADO 7X60X80 (BALLOONS) ×1 IMPLANT
CATH SLIP KMP 65CM 5FR (CATHETERS) ×2 IMPLANT
DEVICE PRESTO INFLATION (MISCELLANEOUS) ×2 IMPLANT
DEVICE TORQUE (MISCELLANEOUS) ×2 IMPLANT
DRAPE BRACHIAL (DRAPES) ×2 IMPLANT
GLIDEWIRE STIFF .35X180X3 HYDR (WIRE) ×2 IMPLANT
GUIDEWIRE ANGLED .035 180CM (WIRE) ×2 IMPLANT
NEEDLE ENTRY 21GA 7CM ECHOTIP (NEEDLE) ×2 IMPLANT
PACK ANGIOGRAPHY (CUSTOM PROCEDURE TRAY) ×2 IMPLANT
SET INTRO CAPELLA COAXIAL (SET/KITS/TRAYS/PACK) ×2 IMPLANT
SHEATH BRITE TIP 6FRX5.5 (SHEATH) ×2 IMPLANT
SHEATH BRITE TIP 7FRX5.5 (SHEATH) ×2 IMPLANT
STENT VIABAHN 7X150X120 (Permanent Stent) ×2 IMPLANT
TOWEL OR 17X26 4PK STRL BLUE (TOWEL DISPOSABLE) ×2 IMPLANT
WIRE G V18X300CM (WIRE) ×2 IMPLANT
WIRE MAGIC TORQUE 260C (WIRE) ×2 IMPLANT

## 2014-12-06 NOTE — H&P (Signed)
[facility] History & Physical Update  The patient was interviewed and re-examined.  The patient's previous History and Physical has been reviewed and is unchanged.  There is no change in the plan of care. We plan to proceed with the scheduled procedure.  Tim Becker, Dolores Lory, MD  12/06/2014, 1:29 PM

## 2014-12-06 NOTE — Op Note (Signed)
[facility]  Percutaneous Study/Intervention Procedural Note   Date of Surgery: 12/06/2014  Surgeon:Schnier, Dolores Lory   Pre-operative Diagnosis: Complication of AV fistula left arm with thrombosis after intervention; end-stage renal disease requiring hemodialysis; hypertension Post-operative diagnosis:  Same  Procedure(s) Performed:  1.  Contrast injection left radiocephalic fistula  2.  Percutaneous transluminal and plasty and stent placement left radiocephalic fistula   Anesthesia: Conscious sedation with Versed and fentanyl IV  Sheath: 7 French sheath retrograde direction left radiocephalic fistula; 6 French sheath antegrade direction left radiocephalic fistula  Contrast: 55 cc  Fluoroscopy Time: 7.2 minutes  Indications:  The patient presents following an unsuccessful intervention at an outside center. At that time the main venous outflow of the radiocephalic fistula was angioplastied however at this subsequently resulted in thrombosis. Attempts at intervention of an accessory branch were also unsuccessful and he was sent to the office for further evaluation. Risks and benefits for angiography and intervention are reviewed all questions answered patient has agreed to proceed.  Procedure:  Tim Becker a 49 y.o. male who was identified and appropriate procedural time out was performed.  The patient was then placed supine on the table and prepped and draped in the usual sterile fashion.  Ultrasound was used to evaluate the left distal cephalic vein.  It was echolucent and compressible indicating it was patent .  A digital ultrasound image was acquired.  A micropuncture needle was used to access the left distal cephalic vein under ultrasound guidance microwire followed by micro-sheath were then inserted J-wire followed by 6 French sheath. This was performed in the antegrade direction.  Hand injection contrast then demonstrated multiple collateral veins. There is  one straight fairly dominant outflow there is also a stump of residual suggesting the previous dominant venous outflow. A wire was then negotiated out the accessory and across the elbow. With this wire in place the sheath was pulled back and a stiff angle Glidewire was used in attempt to cross the original cephalic outflow. This was unsuccessful. Based on reflux images fully treating all the collateral branches would be impossible from the antegrade direction and therefore ultrasound was returned to the field. The vein was then scanned from the insertion site along this vein to a point near the antecubital fossa image was recorded for the permanent record. Vein was noted to be echolucent and compressible indicating patency and under real-time visualization a microneedle is inserted into the vein microwire followed micro-sheath J-wire followed by a 7 French sheath.  Using a floppy Glidewire and Kumpe catheter the wire was negotiated through the entire length the fistula out into the radial artery where hand injection contrast demonstrated the fistula from its arterial anastomosis all the way to the antecubital fossa. Stricture is noted at the level of the 6 French sheath insertion site numerous collaterals were also noted and a narrowing is again noted in the midforearm. A total of 4000 units of heparin was then given Kumpe is reintroduced and of the 1801 8 wire is exchanged for the original wire. After making appropriate measurements verifying the calibration on the existing 6 French sheath a 7 x 150 Viabahn stent is positioned with its leading edge approximately 2 cm above the actual anastomosis and then deployed back to a level near the antecubital fossa. Is then postdilated with an 8 x 6 Dorado balloon 3 separate inflations to 20 atm are made. KMP catheter is then reintroduced over the wire and hand injection at the level of  the arterial anastomosis performed demonstrating stent is widely patent all the  collaterals have now been excluded there is no evidence of any residual stenosis. Venous outflow across the antecubital fossa and to the upper arm is widely patent.  It should be noted that simultaneous with the deployment of the stent 6 French antegrade sheath was removed and light pressure was held. Once the entire system and verified to be widely patent the 7 French sheath was removed after pursestring suture 4-0 Monocryl was placed. Patient tolerated procedure well.  Interpretation initial views demonstrate the cephalic vein is patent in from the arterial anastomosis for about 3-4 cm. There is then a narrowing. What appears to be the true cephalic vein is occluded and this was not crossable. Numerous collaterals were also noted. The dominant outflow at this point in time is measuring at 4 mm inadequate for access. More proximally at the level of the antecubital fossa and the upper arm the venous system is widely patent.  After placing a retrograde sheath and deploying a Viabahn stent postdilated this to fully expanded to 7 mm there is now wide patency of the fistula with exclusion of all collaterals and treatment of all the strictures. Venous outflow was preserved.   Disposition: Patient was taken to the recovery room in stable condition having tolerated the procedure well.  Schnier, Dolores Lory

## 2014-12-07 ENCOUNTER — Encounter: Payer: Self-pay | Admitting: Vascular Surgery

## 2015-01-26 ENCOUNTER — Other Ambulatory Visit: Payer: Self-pay | Admitting: *Deleted

## 2015-01-26 DIAGNOSIS — Z01810 Encounter for preprocedural cardiovascular examination: Secondary | ICD-10-CM

## 2015-01-26 DIAGNOSIS — N186 End stage renal disease: Secondary | ICD-10-CM

## 2015-01-31 ENCOUNTER — Inpatient Hospital Stay: Admission: RE | Admit: 2015-01-31 | Payer: Medicare Other | Source: Ambulatory Visit

## 2015-02-02 ENCOUNTER — Other Ambulatory Visit (HOSPITAL_COMMUNITY): Payer: Medicare Other

## 2015-02-02 ENCOUNTER — Encounter (HOSPITAL_COMMUNITY): Payer: Medicare Other

## 2015-02-06 ENCOUNTER — Other Ambulatory Visit: Payer: Self-pay | Admitting: Vascular Surgery

## 2015-02-07 ENCOUNTER — Encounter
Admission: RE | Admit: 2015-02-07 | Discharge: 2015-02-07 | Disposition: A | Discharge status: Home / Self Care | Payer: Medicare Other | Source: Ambulatory Visit | Attending: Vascular Surgery | Admitting: Vascular Surgery

## 2015-02-07 ENCOUNTER — Ambulatory Visit: Payer: Medicare Other | Admitting: Vascular Surgery

## 2015-02-07 DIAGNOSIS — Z01812 Encounter for preprocedural laboratory examination: Secondary | ICD-10-CM | POA: Diagnosis present

## 2015-02-07 DIAGNOSIS — Z01818 Encounter for other preprocedural examination: Secondary | ICD-10-CM | POA: Insufficient documentation

## 2015-02-07 HISTORY — DX: Essential (primary) hypertension: I10

## 2015-02-07 LAB — CBC WITH DIFFERENTIAL/PLATELET
Basophils Absolute: 0.1 10*3/uL (ref 0–0.1)
Basophils Relative: 1 %
Eosinophils Absolute: 0.1 10*3/uL (ref 0–0.7)
Eosinophils Relative: 1 %
HCT: 34.6 % — ABNORMAL LOW (ref 40.0–52.0)
Hemoglobin: 11 g/dL — ABNORMAL LOW (ref 13.0–18.0)
Lymphocytes Relative: 17 %
Lymphs Abs: 1.7 10*3/uL (ref 1.0–3.6)
MCH: 27 pg (ref 26.0–34.0)
MCHC: 31.8 g/dL — ABNORMAL LOW (ref 32.0–36.0)
MCV: 85 fL (ref 80.0–100.0)
Monocytes Absolute: 0.6 10*3/uL (ref 0.2–1.0)
Monocytes Relative: 6 %
Neutro Abs: 7.4 10*3/uL — ABNORMAL HIGH (ref 1.4–6.5)
Neutrophils Relative %: 75 %
Platelets: 199 10*3/uL (ref 150–440)
RBC: 4.07 MIL/uL — ABNORMAL LOW (ref 4.40–5.90)
RDW: 17.8 % — ABNORMAL HIGH (ref 11.5–14.5)
WBC: 9.9 10*3/uL (ref 3.8–10.6)

## 2015-02-07 LAB — BASIC METABOLIC PANEL
Anion gap: 8 (ref 5–15)
BUN: 54 mg/dL — ABNORMAL HIGH (ref 6–20)
CO2: 28 mmol/L (ref 22–32)
Calcium: 8.6 mg/dL — ABNORMAL LOW (ref 8.9–10.3)
Chloride: 101 mmol/L (ref 101–111)
Creatinine, Ser: 6.02 mg/dL — ABNORMAL HIGH (ref 0.61–1.24)
GFR calc Af Amer: 11 mL/min — ABNORMAL LOW (ref >60)
GFR calc non Af Amer: 10 mL/min — ABNORMAL LOW (ref >60)
Glucose, Bld: 261 mg/dL — ABNORMAL HIGH (ref 65–99)
Potassium: 4.5 mmol/L (ref 3.5–5.1)
Sodium: 137 mmol/L (ref 135–145)

## 2015-02-07 LAB — PROTIME-INR
INR: 1.06
Prothrombin Time: 14 seconds (ref 11.4–15.0)

## 2015-02-07 LAB — TYPE AND SCREEN
ABO/RH(D): O POS
Antibody Screen: NEGATIVE

## 2015-02-07 LAB — ABO/RH: ABO/RH(D): O POS

## 2015-02-07 LAB — APTT: aPTT: 33 seconds (ref 24–36)

## 2015-02-07 NOTE — Patient Instructions (Signed)
  Your procedure is scheduled on: Friday 02/17/2015 Report to Day Surgery. 2ND FLOOR MEDICAL MALL ENTRANCE To find out your arrival time please call [phone] between 1PM - 3PM on Thursday 02/16/2015.  Remember: Instructions that are not followed completely may result in serious medical risk, up to and including death, or upon the discretion of your surgeon and anesthesiologist your surgery may need to be rescheduled.    __X_ 1. Do not eat food or drink liquids after midnight. No gum chewing or hard candies.     __X__ 2. No Alcohol for 24 hours before or after surgery.   ____ 3. Bring all medications with you on the day of surgery if instructed.    __X__ 4. Notify your doctor if there is any change in your medical condition     (cold, fever, infections).     Do not wear jewelry, make-up, hairpins, clips or nail polish.  Do not wear lotions, powders, or perfumes.   Do not shave 48 hours prior to surgery. Men may shave face and neck.  Do not bring valuables to the hospital.    [facility] is not responsible for any belongings or valuables.               Contacts, dentures or bridgework may not be worn into surgery.  Leave your suitcase in the car. After surgery it may be brought to your room.  For patients admitted to the hospital, discharge time is determined by your                treatment team.   Patients discharged the day of surgery will not be allowed to drive home.   Please read over the following fact sheets that you were given:   Surgical Site Infection Prevention   __X_ Take these medicines the morning of surgery with A SIP OF WATER:    1. CARVEDILOL  2.   3.   4.  5.  6.  ____ Fleet Enema (as directed)   __X__ Use CHG Soap as directed  SAGE WIPES AS DIRECTED  ____ Use inhalers on the day of surgery  ____ Stop metformin 2 days prior to surgery    __X__ Take 1/2 of usual insulin dose the night before surgery and none on the morning of surgery.   ____ Stop  Coumadin/Plavix/aspirin on   ____ Stop Anti-inflammatories on    ____ Stop supplements until after surgery.    ____ Bring C-Pap to the hospital.

## 2015-02-17 ENCOUNTER — Ambulatory Visit: Payer: Medicare Other | Admitting: Anesthesiology

## 2015-02-17 ENCOUNTER — Ambulatory Visit
Admission: RE | Admit: 2015-02-17 | Discharge: 2015-02-17 | Disposition: A | Discharge status: Home / Self Care | Payer: Medicare Other | Source: Ambulatory Visit | Attending: Vascular Surgery | Admitting: Vascular Surgery

## 2015-02-17 ENCOUNTER — Encounter
Admission: RE | Disposition: A | Discharge status: Home / Self Care | Payer: Self-pay | Source: Ambulatory Visit | Attending: Vascular Surgery

## 2015-02-17 DIAGNOSIS — M86679 Other chronic osteomyelitis, unspecified ankle and foot: Secondary | ICD-10-CM | POA: Diagnosis not present

## 2015-02-17 DIAGNOSIS — D631 Anemia in chronic kidney disease: Secondary | ICD-10-CM | POA: Insufficient documentation

## 2015-02-17 DIAGNOSIS — I1310 Hypertensive heart and chronic kidney disease without heart failure, with stage 1 through stage 4 chronic kidney disease, or unspecified chronic kidney disease: Secondary | ICD-10-CM | POA: Insufficient documentation

## 2015-02-17 DIAGNOSIS — Z955 Presence of coronary angioplasty implant and graft: Secondary | ICD-10-CM | POA: Diagnosis not present

## 2015-02-17 DIAGNOSIS — Z79899 Other long term (current) drug therapy: Secondary | ICD-10-CM | POA: Insufficient documentation

## 2015-02-17 DIAGNOSIS — E039 Hypothyroidism, unspecified: Secondary | ICD-10-CM | POA: Diagnosis not present

## 2015-02-17 DIAGNOSIS — Z833 Family history of diabetes mellitus: Secondary | ICD-10-CM | POA: Diagnosis not present

## 2015-02-17 DIAGNOSIS — Z841 Family history of disorders of kidney and ureter: Secondary | ICD-10-CM | POA: Diagnosis not present

## 2015-02-17 DIAGNOSIS — N186 End stage renal disease: Secondary | ICD-10-CM | POA: Diagnosis present

## 2015-02-17 DIAGNOSIS — I2510 Atherosclerotic heart disease of native coronary artery without angina pectoris: Secondary | ICD-10-CM | POA: Diagnosis not present

## 2015-02-17 DIAGNOSIS — Z794 Long term (current) use of insulin: Secondary | ICD-10-CM | POA: Insufficient documentation

## 2015-02-17 DIAGNOSIS — Z809 Family history of malignant neoplasm, unspecified: Secondary | ICD-10-CM | POA: Insufficient documentation

## 2015-02-17 DIAGNOSIS — Z8249 Family history of ischemic heart disease and other diseases of the circulatory system: Secondary | ICD-10-CM | POA: Diagnosis not present

## 2015-02-17 DIAGNOSIS — Z992 Dependence on renal dialysis: Secondary | ICD-10-CM | POA: Diagnosis not present

## 2015-02-17 DIAGNOSIS — Z6836 Body mass index (BMI) 36.0-36.9, adult: Secondary | ICD-10-CM | POA: Diagnosis not present

## 2015-02-17 DIAGNOSIS — E1122 Type 2 diabetes mellitus with diabetic chronic kidney disease: Secondary | ICD-10-CM | POA: Diagnosis not present

## 2015-02-17 HISTORY — PX: BASCILIC VEIN TRANSPOSITION: SHX5742

## 2015-02-17 LAB — POCT I-STAT 4, (NA,K, GLUC, HGB,HCT)
Glucose, Bld: 167 mg/dL — ABNORMAL HIGH (ref 65–99)
HCT: 36 % — ABNORMAL LOW (ref 39.0–52.0)
Hemoglobin: 12.2 g/dL — ABNORMAL LOW (ref 13.0–17.0)
Potassium: 4.1 mmol/L (ref 3.5–5.1)
Sodium: 139 mmol/L (ref 135–145)

## 2015-02-17 SURGERY — TRANSPOSITION, VEIN, BASILIC
Anesthesia: General | Laterality: Left | Wound class: Clean

## 2015-02-17 MED ORDER — FENTANYL CITRATE (PF) 100 MCG/2ML IJ SOLN
25.0000 ug | INTRAMUSCULAR | Status: DC | PRN
  Administered 2015-02-17 (×2): 25 ug via INTRAVENOUS

## 2015-02-17 MED ORDER — SODIUM CHLORIDE 0.9 % IV SOLN
INTRAVENOUS | Status: DC
Start: 2015-02-17 — End: 2015-02-17
  Administered 2015-02-17: 09:00:00 via INTRAVENOUS

## 2015-02-17 MED ORDER — BUPIVACAINE HCL (PF) 0.5 % IJ SOLN
INTRAMUSCULAR | Status: AC
  Filled 2015-02-17: qty 30

## 2015-02-17 MED ORDER — FENTANYL CITRATE (PF) 100 MCG/2ML IJ SOLN
INTRAMUSCULAR | Status: DC | PRN
  Administered 2015-02-17 (×2): 25 ug via INTRAVENOUS
  Administered 2015-02-17: 50 ug via INTRAVENOUS
  Administered 2015-02-17: 25 ug via INTRAVENOUS

## 2015-02-17 MED ORDER — OXYCODONE-ACETAMINOPHEN 5-325 MG PO TABS: 1.0000 | ORAL_TABLET | Freq: Four times a day (QID) | ORAL | Status: DC | PRN

## 2015-02-17 MED ORDER — PHENYLEPHRINE HCL 10 MG/ML IJ SOLN
INTRAMUSCULAR | Status: DC | PRN
  Administered 2015-02-17 (×2): 100 ug via INTRAVENOUS

## 2015-02-17 MED ORDER — HEPARIN SODIUM (PORCINE) 5000 UNIT/ML IJ SOLN
INTRAMUSCULAR | Status: AC
  Filled 2015-02-17: qty 1

## 2015-02-17 MED ORDER — SODIUM CHLORIDE 0.9 % IV SOLN: INTRAVENOUS | Status: DC

## 2015-02-17 MED ORDER — CEFAZOLIN SODIUM-DEXTROSE 2-3 GM-% IV SOLR
2.0000 g | INTRAVENOUS | Status: AC
  Administered 2015-02-17: 2 g via INTRAVENOUS

## 2015-02-17 MED ORDER — MIDAZOLAM HCL 2 MG/2ML IJ SOLN
INTRAMUSCULAR | Status: DC | PRN
  Administered 2015-02-17: 2 mg via INTRAVENOUS

## 2015-02-17 MED ORDER — EPHEDRINE SULFATE 50 MG/ML IJ SOLN
INTRAMUSCULAR | Status: DC | PRN
  Administered 2015-02-17: 15 mg via INTRAVENOUS

## 2015-02-17 MED ORDER — PROPOFOL 10 MG/ML IV BOLUS
INTRAVENOUS | Status: DC | PRN
  Administered 2015-02-17: 180 mg via INTRAVENOUS

## 2015-02-17 MED ORDER — CEFAZOLIN SODIUM-DEXTROSE 2-3 GM-% IV SOLR
INTRAVENOUS | Status: DC | PRN
  Administered 2015-02-17: 2 g via INTRAVENOUS

## 2015-02-17 MED ORDER — FAMOTIDINE 20 MG PO TABS
20.0000 mg | ORAL_TABLET | Freq: Once | ORAL | Status: AC
Start: 2015-02-17 — End: 2015-02-17
  Administered 2015-02-17: 20 mg via ORAL

## 2015-02-17 MED ORDER — FENTANYL CITRATE (PF) 100 MCG/2ML IJ SOLN
INTRAMUSCULAR | Status: AC
  Filled 2015-02-17: qty 2

## 2015-02-17 MED ORDER — SCOPOLAMINE 1 MG/3DAYS TD PT72
MEDICATED_PATCH | TRANSDERMAL | Status: AC
  Filled 2015-02-17: qty 1

## 2015-02-17 MED ORDER — SODIUM CHLORIDE 0.9 % IJ SOLN
INTRAMUSCULAR | Status: AC
  Filled 2015-02-17: qty 10

## 2015-02-17 MED ORDER — PROMETHAZINE HCL 25 MG/ML IJ SOLN
6.2500 mg | INTRAMUSCULAR | Status: DC | PRN
  Administered 2015-02-17: 12.5 mg via INTRAVENOUS

## 2015-02-17 MED ORDER — LIDOCAINE HCL (CARDIAC) 20 MG/ML IV SOLN
INTRAVENOUS | Status: DC | PRN
  Administered 2015-02-17: 40 mg via INTRAVENOUS

## 2015-02-17 MED ORDER — INSULIN ASPART 100 UNIT/ML ~~LOC~~ SOLN: 0.0000 [IU] | SUBCUTANEOUS | Status: DC

## 2015-02-17 MED ORDER — SODIUM CHLORIDE 0.9 % IV SOLN
INTRAVENOUS | Status: DC | PRN
  Administered 2015-02-17: 100 mL via INTRAMUSCULAR

## 2015-02-17 MED ORDER — ONDANSETRON HCL 4 MG/2ML IJ SOLN
INTRAMUSCULAR | Status: DC | PRN
  Administered 2015-02-17: 4 mg via INTRAVENOUS

## 2015-02-17 MED ORDER — SCOPOLAMINE 1 MG/3DAYS TD PT72
1.0000 | MEDICATED_PATCH | TRANSDERMAL | Status: DC
  Administered 2015-02-17: 1.5 mg via TRANSDERMAL

## 2015-02-17 MED ORDER — PROMETHAZINE HCL 25 MG/ML IJ SOLN
INTRAMUSCULAR | Status: AC
  Filled 2015-02-17: qty 1

## 2015-02-17 SURGICAL SUPPLY — 51 items
APPLIER CLIP 11 MED OPEN (CLIP)
APPLIER CLIP 9.375 SM OPEN (CLIP)
BAG DECANTER FOR FLEXI CONT (MISCELLANEOUS) ×2 IMPLANT
BLADE SURG 15 STRL LF DISP TIS (BLADE) ×1 IMPLANT
BLADE SURG 15 STRL SS (BLADE) ×1
BLADE SURG SZ11 CARB STEEL (BLADE) ×2 IMPLANT
BOOT SUTURE AID YELLOW STND (SUTURE) ×2 IMPLANT
BRUSH SCRUB 4% CHG (MISCELLANEOUS) ×2 IMPLANT
CANISTER SUCT 1200ML W/VALVE (MISCELLANEOUS) ×2 IMPLANT
CHLORAPREP W/TINT 26ML (MISCELLANEOUS) ×2 IMPLANT
CLIP APPLIE 11 MED OPEN (CLIP) IMPLANT
CLIP APPLIE 9.375 SM OPEN (CLIP) IMPLANT
DECANTER SPIKE VIAL GLASS SM (MISCELLANEOUS) IMPLANT
DRESSING SURGICEL FIBRLLR 1X2 (HEMOSTASIS) ×1 IMPLANT
DRSG SURGICEL FIBRILLAR 1X2 (HEMOSTASIS) ×2
ELECT CAUTERY BLADE 6.4 (BLADE) ×2 IMPLANT
GEL ULTRASOUND 20GR AQUASONIC (MISCELLANEOUS) IMPLANT
GLOVE SURG SYN 8.0 (GLOVE) ×16 IMPLANT
GOWN L4 XLG 20 PK N/S (GOWN DISPOSABLE) ×2 IMPLANT
GOWN STRL REUS W/ TWL LRG LVL3 (GOWN DISPOSABLE) ×3 IMPLANT
GOWN STRL REUS W/TWL LRG LVL3 (GOWN DISPOSABLE) ×3
IV NS 500ML (IV SOLUTION) ×1
IV NS 500ML BAXH (IV SOLUTION) ×1 IMPLANT
KIT RM TURNOVER STRD PROC AR (KITS) ×2 IMPLANT
LABEL OR SOLS (LABEL) ×2 IMPLANT
LIQUID BAND (GAUZE/BANDAGES/DRESSINGS) ×8 IMPLANT
LOOP RED MAXI  1X406MM (MISCELLANEOUS) ×1
LOOP VESSEL MAXI 1X406 RED (MISCELLANEOUS) ×1 IMPLANT
LOOP VESSEL MINI 0.8X406 BLUE (MISCELLANEOUS) ×2 IMPLANT
LOOPS BLUE MINI 0.8X406MM (MISCELLANEOUS) ×2
NEEDLE FILTER BLUNT 18X 1/2SAF (NEEDLE) ×1
NEEDLE FILTER BLUNT 18X1 1/2 (NEEDLE) ×1 IMPLANT
PACK EXTREMITY ARMC (MISCELLANEOUS) ×2 IMPLANT
PAD GROUND ADULT SPLIT (MISCELLANEOUS) ×2 IMPLANT
PAD PREP 24X41 OB/GYN DISP (PERSONAL CARE ITEMS) ×2 IMPLANT
SPONGE XRAY 4X4 16PLY STRL (MISCELLANEOUS) ×2 IMPLANT
STOCKINETTE STRL 4IN 9604848 (GAUZE/BANDAGES/DRESSINGS) ×2 IMPLANT
SUT MNCRL+ 5-0 UNDYED PC-3 (SUTURE) ×2 IMPLANT
SUT MONOCRYL 5-0 (SUTURE) ×2
SUT PROLENE 6 0 BV (SUTURE) ×14 IMPLANT
SUT SILK 2 0 (SUTURE) ×1
SUT SILK 2 0 SH (SUTURE) IMPLANT
SUT SILK 2-0 18XBRD TIE 12 (SUTURE) ×1 IMPLANT
SUT SILK 3 0 (SUTURE) ×2
SUT SILK 3-0 18XBRD TIE 12 (SUTURE) ×2 IMPLANT
SUT SILK 4 0 (SUTURE) ×1
SUT SILK 4-0 18XBRD TIE 12 (SUTURE) ×1 IMPLANT
SUT VIC AB 3-0 SH 27 (SUTURE) ×5
SUT VIC AB 3-0 SH 27X BRD (SUTURE) ×5 IMPLANT
SYR 20CC LL (SYRINGE) ×2 IMPLANT
SYR 3ML LL SCALE MARK (SYRINGE) ×2 IMPLANT

## 2015-02-17 NOTE — Op Note (Signed)
OPERATIVE NOTE   PROCEDURE: left  basilic vein transposition (brachiobasilic arteriovenous fistula) placement  PRE-OPERATIVE DIAGNOSIS: End-stage renal disease on hemodialysis  POST-OPERATIVE DIAGNOSIS: Same  SURGEON: Katha Cabal, M.D.  ASSISTANT(S): Ms. Hezzie Bump  ANESTHESIA: general  ESTIMATED BLOOD LOSS: Minimal cc  FINDING(S): Basilic vein of good caliber  SPECIMEN(S):  None  INDICATIONS:   Tim Becker is a 49 year old male who presents with end stage renal disease. He is therefore undergoing creation of a fistula so that he will have adequate dialysis access in the future and avoid catheter placement..   The patient is aware the risks include but are not limited to: bleeding, infection, steal syndrome, nerve damage, failure to mature, and need for additional procedures.  The patient is aware of the risks of the procedure and elects to proceed forward.  DESCRIPTION: After full informed written consent was obtained from the patient, the patient was brought back to the operating room and placed supine upon the operating table.  Prior to induction, the patient received IV antibiotics.   After obtaining adequate anesthesia, the patient's left arm was then prepped and draped in the standard fashion. Appropriate timeout is called  Preoperative vein mapping indicates a basilic vein which is 4 mm or larger throughout its course and therefore basilic transposition will be performed in a single stage.  I turned my attention first to identifying the patient's basilic vein.  I made an longitudinal incision over the vein from the level of the antecubital fossa up to its axillary extent.  I carefully dissected the vein away from its adjacent nerves.  The entirety of the basilic vein was mobilized and the vein was then marked with a surgical marker to maintain appropriate orientation. It was then irrigated with heparinized saline.   A plane on top of the biceps fascia was dissected with  electrocautery. The vein was then approximated to the skin in order to plan for the position of the arterial anastomosis.  The deep subcutaneous tissue was inspected for bleeding.  The basilic vein was then pulled subcutaneously using a Kelly clamp after a small counterincision was made at the apex of the tunnel.   The brachial artery is then dissected and looped with Silastic Vessel loops. Brachial artery is then controlled with the vessel loops and arteriotomy is made with an 11 blade and extended with Potts scissors and 6-0 Prolene stay sutures were placed. End vein to side brachial artery anastomosis is then fashioned with running 6-0 Prolene. Flushing maneuvers performed and flow was established through the fistula. At this point the thrill was quite good at the axillary and however the arterial portion appeared to be markedly pulsatile. Manipulation suggested there was a twist and therefore a profunda clamp was placed just above the anastomosis and the vein was transected. It was then pulled back through the tunnel irrigated the marking was verified and then pulled back through the tunnel. Heparin on a Marx tip was used to irrigate the vein which irrigated quite nicely. An end-to-end vein-to-vein anastomosis was then fashioned with running 6-0 Prolene. Excellent thrill is noted within the vein 2+ radial pulses maintained at the wrist.   Bleeding was controlled with electrocautery and placement of large pieces of thrombin and gelfoam.  I washed out the surgical site after waiting a few minutes, and there was no further bleeding.  The fascia was reapproximated with interrupted stitches of 3-0 Vicryl to eliminate some of the deep space.  The superficial subcutaneous tissue was then reapproximated  along the incision line with a running stitch of 3-0 Vicryl.  The skin was then reapproximated with a running subcuticular of 4-0 Monocryl.  The skin was then cleaned, dried, and reinforced with Dermabond.    The  patient tolerated this procedure well.   COMPLICATIONS: None  CONDITION: Tim Becker  10/07/2014, 9:46 AM

## 2015-02-17 NOTE — Transfer of Care (Signed)
Immediate Anesthesia Transfer of Care Note  Patient: Tim Becker  Procedure(s) Performed: Procedure(s): Coram (Left)  Patient Location: PACU  Anesthesia Type:General  Level of Consciousness: sedated  Airway & Oxygen Therapy: Patient Spontanous Breathing and Patient connected to face mask oxygen  Post-op Assessment: Report given to RN and Post -op Vital signs reviewed and stable  Post vital signs: Reviewed and stable  Last Vitals:  Filed Vitals:   02/17/15 1458  BP: 180/90  Pulse: 98  Temp: 37.9 C  Resp: 13    Complications: No apparent anesthesia complications

## 2015-02-17 NOTE — Discharge Instructions (Signed)

## 2015-02-17 NOTE — Anesthesia Procedure Notes (Signed)
Procedure Name: LMA Insertion Date/Time: 02/17/2015 11:20 AM Performed by: Allean Found Pre-anesthesia Checklist: Patient identified, Emergency Drugs available, Suction available, Patient being monitored and Timeout performed Patient Re-evaluated:Patient Re-evaluated prior to inductionOxygen Delivery Method: Circle system utilized Preoxygenation: Pre-oxygenation with 100% oxygen Intubation Type: IV induction LMA: LMA inserted LMA Size: 4.0 Number of attempts: 1 Placement Confirmation: positive ETCO2 and breath sounds checked- equal and bilateral Tube secured with: Tape Dental Injury: Teeth and Oropharynx as per pre-operative assessment

## 2015-02-17 NOTE — H&P (Signed)
[facility] History & Physical Update  The patient was interviewed and re-examined.  The patient's previous History and Physical has been reviewed and is unchanged.  There is no change in the plan of care. We plan to proceed with the scheduled procedure.  Jonee Lamore, Dolores Lory, MD  02/17/2015, 11:05 AM

## 2015-02-17 NOTE — OR Nursing (Signed)
Patient resting quietly, but upon awakening, he had nausea and vomiting.  Vomited up bile colored liquid.  Phenergan given.

## 2015-02-17 NOTE — OR Nursing (Signed)
Left upper arm site appears dry and no swelling.  Strong thrill felt at wound sites.

## 2015-02-17 NOTE — Anesthesia Preprocedure Evaluation (Signed)
Anesthesia Evaluation  Patient identified by MRN, date of birth, ID band Patient awake    Reviewed: Allergy & Precautions, H&P , NPO status , Patient's Chart, lab work & pertinent test results, reviewed documented beta blocker date and time   History of Anesthesia Complications (+) PONV and history of anesthetic complications  Airway Mallampati: III  TM Distance: >3 FB Neck ROM: full    Dental no notable dental hx. (+) Chipped, Missing   Pulmonary neg shortness of breath, sleep apnea and Continuous Positive Airway Pressure Ventilation , neg COPD, neg recent URI,    Pulmonary exam normal breath sounds clear to auscultation       Cardiovascular Exercise Tolerance: Good hypertension, (-) angina+ CAD and + Cardiac Stents (stent placed in 2003)  (-) Past MI and (-) CABG Normal cardiovascular exam(-) dysrhythmias (-) Valvular Problems/Murmurs Rhythm:regular Rate:Normal     Neuro/Psych negative neurological ROS  negative psych ROS   GI/Hepatic negative GI ROS, Neg liver ROS,   Endo/Other  negative endocrine ROSdiabetesHypothyroidism (no longer on meds, controlled) Morbid obesity  Renal/GU ESRF and DialysisRenal disease: last dialysis was yesterday.  negative genitourinary   Musculoskeletal   Abdominal   Peds  Hematology negative hematology ROS (+)   Anesthesia Other Findings Past Medical History:   ESRD (end stage renal disease) ([place])                           Comment:Pre- dialysis   DM (diabetes mellitus) with complications ([place])              Coronary artery disease                                      Thyroid disease                                              Osteomyelitis, chronic, ankle or foot (HCC)                  Hypertension                                                 Reproductive/Obstetrics negative OB ROS                             Anesthesia Physical Anesthesia Plan  ASA:  IV  Anesthesia Plan: General   Post-op Pain Management:    Induction:   Airway Management Planned:   Additional Equipment:   Intra-op Plan:   Post-operative Plan:   Informed Consent: I have reviewed the patients History and Physical, chart, labs and discussed the procedure including the risks, benefits and alternatives for the proposed anesthesia with the patient or authorized representative who has indicated his/her understanding and acceptance.   Dental Advisory Given  Plan Discussed with: Anesthesiologist, CRNA and Surgeon  Anesthesia Plan Comments:         Anesthesia Quick Evaluation

## 2015-02-20 ENCOUNTER — Encounter: Payer: Self-pay | Admitting: Vascular Surgery

## 2015-02-20 NOTE — Anesthesia Postprocedure Evaluation (Signed)
Anesthesia Post Note  Patient: Tim Becker  Procedure(s) Performed: Procedure(s) (LRB): Minkler (Left)  Patient location during evaluation: PACU Level of consciousness: awake and alert and oriented Pain management: pain level controlled Vital Signs Assessment: post-procedure vital signs reviewed and stable Respiratory status: spontaneous breathing Cardiovascular status: blood pressure returned to baseline Postop Assessment: No headache Anesthetic complications: no    Last Vitals:  Filed Vitals:   02/17/15 1614 02/17/15 1731  BP: 156/76 153/101  Pulse: 98 102  Temp: 37.1 C   Resp: 16 16    Last Pain:  Filed Vitals:   02/17/15 1732  PainSc: 0-No pain                 Martha Clan

## 2015-05-26 ENCOUNTER — Encounter: Payer: Self-pay | Admitting: Urology

## 2015-05-26 ENCOUNTER — Ambulatory Visit (INDEPENDENT_AMBULATORY_CARE_PROVIDER_SITE_OTHER): Payer: Medicare Other | Admitting: Urology

## 2015-05-26 VITALS — BP 155/74 | HR 92 | Ht 67.0 in | Wt 240.5 lb

## 2015-05-26 DIAGNOSIS — Z992 Dependence on renal dialysis: Secondary | ICD-10-CM

## 2015-05-26 DIAGNOSIS — L089 Local infection of the skin and subcutaneous tissue, unspecified: Secondary | ICD-10-CM | POA: Diagnosis not present

## 2015-05-26 DIAGNOSIS — N186 End stage renal disease: Secondary | ICD-10-CM | POA: Diagnosis not present

## 2015-05-26 DIAGNOSIS — E11628 Type 2 diabetes mellitus with other skin complications: Secondary | ICD-10-CM

## 2015-05-26 DIAGNOSIS — E1169 Type 2 diabetes mellitus with other specified complication: Secondary | ICD-10-CM

## 2015-05-26 DIAGNOSIS — N2889 Other specified disorders of kidney and ureter: Secondary | ICD-10-CM

## 2015-05-26 NOTE — Progress Notes (Signed)
05/26/2015 11:52 AM   Tim Becker 06-20-1965 [id number]  Referring provider: No referring provider defined for this encounter.  Chief Complaint  Patient presents with  . Renal Mass    HPI:  50 year old male with end-stage renal disease now on hemodialysis, diabetes who presents today for follow-up of his known left renal mass.  Previously, this mass is unable to be fully characterized due to his inability to receive IV contrast and habitus.  Most recent imaging at [facility] reveals a 3.3 cm left mid pole exophytic renal mass on 08/08/2014. He was scheduled to return for follow-up, unfortunately he's had multiple medical issues over the past year including initiation of hemodialysis, issues with his right foot requiring right AKA.  Most recent CT scan ([place]) shows increased size of the mass now up to 3.9 cm on noncontrast CT abdomen and pelvis on 05/09/2015. Report was able to be reviewed but not the actual images today.  Now on HD, M-W-Fr at [facility] in [place] (Donato Heinz, MD).  He has had issues with right foot and rigth AKA with several admissions over the past year for this.  He has been actively trying to lose weight.    He is still hopeful that he can a candidate for renal transplantation.   PMH: Past Medical History  Diagnosis Date  . ESRD (end stage renal disease) ([place])     Pre- dialysis  . DM (diabetes mellitus) with complications ([place])   . Coronary artery disease   . Thyroid disease   . Osteomyelitis, chronic, ankle or foot ([place])   . Hypertension   . CKD (chronic kidney disease)   . Left kidney mass   . Diabetes ([place])   . Sleep apnea   . Anemia   . Renal insufficiency     Patient is on Dialysis and receives M,W and F.    Surgical History: Past Surgical History  Procedure Laterality Date  . Coronary angioplasty with stent placement    . Joint replacement    . Foot surgery      Multiple R foot surgery for infection and charcot  foot  . Peripheral vascular catheterization Left 12/06/2014    Procedure: A/V Shuntogram/Fistulagram;  Surgeon: Katha Cabal, MD;  Location: [place] [facility];  Service: Cardiovascular;  Laterality: Left;  . Peripheral vascular catheterization Left 12/06/2014    Procedure: A/V Shunt Intervention;  Surgeon: Katha Cabal, MD;  Location: [place] [facility];  Service: Cardiovascular;  Laterality: Left;  . Below knee leg amputation Right   . Bascilic vein transposition Left 02/17/2015    Procedure: BASCILIC VEIN TRANSPOSITION;  Surgeon: Katha Cabal, MD;  Location: [facility] OR[facility];  Service: Vascular;  Laterality: Left;    Home Medications:    Medication List       This list is accurate as of: 05/26/15 11:59 PM.  Always use your most recent med list.               carvedilol 12.5 MG tablet  Commonly known as:  COREG  Take 12.5 mg by mouth 2 (two) times daily with a meal.     LANTUS SOLOSTAR 100 UNIT/ML Solostar Pen  Generic drug:  Insulin Glargine  Inject 80 Units into the skin daily.        Allergies: No Known Allergies  Family History: Family History  Problem Relation Age of Onset  . Diabetes Mother   . Kidney disease Mother   . Breast cancer Mother  Social History:  reports that he has never smoked. He does not have any smokeless tobacco history on file. He reports that he does not drink alcohol or use illicit drugs.  ROS: UROLOGY Frequent Urination?: No Hard to postpone urination?: No Burning/pain with urination?: No Get up at night to urinate?: No Leakage of urine?: No Urine stream starts and stops?: No Trouble starting stream?: No Do you have to strain to urinate?: No Blood in urine?: No Urinary tract infection?: No Sexually transmitted disease?: No Injury to kidneys or bladder?: No Painful intercourse?: No Weak stream?: No Erection problems?: No Penile pain?: No  Gastrointestinal Nausea?: No Vomiting?: No Indigestion/heartburn?:  No Diarrhea?: No Constipation?: Yes  Constitutional Fever: No Night sweats?: No Weight loss?: No Fatigue?: No  Skin Skin rash/lesions?: No Itching?: No  Eyes Blurred vision?: Yes Double vision?: No  Ears/Nose/Throat Sore throat?: No Sinus problems?: Yes  Hematologic/Lymphatic Swollen glands?: No Easy bruising?: No  Cardiovascular Leg swelling?: No Chest pain?: No  Respiratory Cough?: Yes Shortness of breath?: No  Endocrine Excessive thirst?: No  Musculoskeletal Back pain?: No Joint pain?: No  Neurological Headaches?: No Dizziness?: No  Psychologic Depression?: No Anxiety?: No  Physical Exam: BP 155/74 mmHg  Pulse 92  Ht 5\' 7"  (1.702 m)  Wt 240 lb 8 oz (109.09 kg)  BMI 37.66 kg/m2  Constitutional:  Alert and oriented, No acute distress. HEENT: [place] AT, moist mucus membranes.  Trachea midline, no masses. Cardiovascular: No clubbing, cyanosis, or edema. Respiratory: Normal respiratory effort, no increased work of breathing. GI: Abdomen is soft, nontender, nondistended, no abdominal masses GU: No CVA tenderness. Obese. No abdominal scars noted. Skin: No rashes, bruises or suspicious lesions. Lymph: No cervical or inguinal adenopathy. Neurologic: Grossly intact, no focal deficits, moving all 4 extremities. Psychiatric: Normal mood and affect.  Laboratory Data: Lab Results  Component Value Date   WBC 9.9 02/07/2015   HGB 12.2* 02/17/2015   HCT 36.0* 02/17/2015   MCV 85.0 02/07/2015   PLT 199 02/07/2015    Lab Results  Component Value Date   CREATININE 6.02* 02/07/2015   Pertinent Imaging: Report only, unable to view images CT Abdomen And Pelvis WO Infusion - Urology Tx2/09/2015  [facility]  Result Impression   1.  Interval enlargement of exophytic left renal neoplasm. 2.  Cholelithiasis without evidence of acute cholecystitis. 3.  Mild scattered atherosclerotic calcifications with relative sparing of the external  iliac arteries.    Assessment & Plan:    1. Renal mass Left exophytic renal mass, now increasing in size up to 3.9 cm. This growth rate is highly concerning for malignancy. Previously, were never able to fully characterize the mass due to his inability to receive IV contrast.  At this point, now that he is on dialysis, we can arrange for a contrasted study followed by dialysis. I have discussed this with his nephrologist who is agreeable with this plan.  Pending the above findings, I have recommended proceeding with right hand-assisted laparoscopic nephrectomy. There is no need for nephron sparing surgery given that the patient is already on hemodialysis.  He did go ahead and discuss the surgery at length today.  Plan follow-up for further discussion after CT abdomen with and without contrast. - CT Abd Wo & W Cm; Future  2. ESRD on dialysis [facility]) Discussed patient with his nephrologist. Please see above.  3. Diabetic foot infection ([place]) S/p AKA  4. Morbid obesity, unspecified obesity type ([place]) Advised continued weight loss to  improved surgical outcome   Return in about 4 weeks (around 06/23/2015).  Hollice Espy, MD  [facility] [address] [place], [place] [address] [phone]

## 2015-06-01 ENCOUNTER — Ambulatory Visit
Admission: RE | Admit: 2015-06-01 | Discharge: 2015-06-01 | Disposition: A | Discharge status: Home / Self Care | Payer: Medicare Other | Source: Ambulatory Visit | Attending: Urology | Admitting: Urology

## 2015-06-01 DIAGNOSIS — I700 Atherosclerosis of aorta: Secondary | ICD-10-CM | POA: Diagnosis not present

## 2015-06-01 DIAGNOSIS — N2889 Other specified disorders of kidney and ureter: Secondary | ICD-10-CM | POA: Diagnosis present

## 2015-06-01 DIAGNOSIS — K8020 Calculus of gallbladder without cholecystitis without obstruction: Secondary | ICD-10-CM | POA: Diagnosis not present

## 2015-06-01 HISTORY — DX: Disorder of kidney and ureter, unspecified: N28.9

## 2015-06-01 MED ORDER — IOHEXOL 350 MG/ML SOLN
100.0000 mL | Freq: Once | INTRAVENOUS | Status: AC | PRN
  Administered 2015-06-01: 100 mL via INTRAVENOUS

## 2015-06-03 ENCOUNTER — Encounter: Payer: Self-pay | Admitting: Urology

## 2015-06-20 ENCOUNTER — Ambulatory Visit: Payer: Medicare Other | Admitting: Urology

## 2015-06-22 ENCOUNTER — Ambulatory Visit (INDEPENDENT_AMBULATORY_CARE_PROVIDER_SITE_OTHER): Payer: Medicare Other | Admitting: Urology

## 2015-06-22 ENCOUNTER — Encounter: Payer: Self-pay | Admitting: Urology

## 2015-06-22 VITALS — BP 159/75 | HR 83 | Ht 67.0 in | Wt 240.0 lb

## 2015-06-22 DIAGNOSIS — N186 End stage renal disease: Secondary | ICD-10-CM

## 2015-06-22 DIAGNOSIS — N2889 Other specified disorders of kidney and ureter: Secondary | ICD-10-CM

## 2015-06-22 DIAGNOSIS — Z992 Dependence on renal dialysis: Secondary | ICD-10-CM

## 2015-06-22 NOTE — Progress Notes (Signed)
3:35 PM  06/22/2015  Tim Becker [birth year] [id number]  Referring provider: No referring provider defined for this encounter.  Chief Complaint  Patient presents with  . Results    39month with CTscan results    HPI:  50 year old male with end-stage renal disease now on hemodialysis, diabetes who presents today for follow-up of his known left renal mass.  Previously, this mass is unable to be fully characterized due to his inability to receive IV contrast and habitus.  Most recent imaging at [facility] reveals a 3.3 cm left mid pole exophytic renal mass on 08/08/2014. He returns today to discuss follow-up CT abdomen with and without contrast which shows the mass is increased to 4 cm and does indeed enhance highly suspicious for RCC.  Now on HD, M-W-Fr at [facility] in [place] (Donato Heinz, MD).  He has had issues with right foot and rigth AKA with several admissions over the past year for this.  He has been actively trying to lose weight.    He is still hopeful that he can a candidate for renal transplantation.   PMH: Past Medical History  Diagnosis Date  . ESRD (end stage renal disease) ([place])     Pre- dialysis  . DM (diabetes mellitus) with complications ([place])   . Coronary artery disease   . Thyroid disease   . Osteomyelitis, chronic, ankle or foot ([place])   . Hypertension   . CKD (chronic kidney disease)   . Left kidney mass   . Diabetes ([place])   . Sleep apnea   . Anemia   . Renal insufficiency     Patient is on Dialysis and receives M,W and F.    Surgical History: Past Surgical History  Procedure Laterality Date  . Coronary angioplasty with stent placement    . Joint replacement    . Foot surgery      Multiple R foot surgery for infection and charcot foot  . Peripheral vascular catheterization Left 12/06/2014    Procedure: A/V Shuntogram/Fistulagram;  Surgeon: Katha Cabal, MD;  Location: [place] [facility];  Service: Cardiovascular;   Laterality: Left;  . Peripheral vascular catheterization Left 12/06/2014    Procedure: A/V Shunt Intervention;  Surgeon: Katha Cabal, MD;  Location: [place] [facility];  Service: Cardiovascular;  Laterality: Left;  . Below knee leg amputation Right   . Bascilic vein transposition Left 02/17/2015    Procedure: BASCILIC VEIN TRANSPOSITION;  Surgeon: Katha Cabal, MD;  Location: [facility] OR[facility];  Service: Vascular;  Laterality: Left;    Home Medications:    Medication List       This list is accurate as of: 06/22/15  3:35 PM.  Always use your most recent med list.               calcium acetate 667 MG capsule  Commonly known as:  PHOSLO     carvedilol 12.5 MG tablet  Commonly known as:  COREG  Take 12.5 mg by mouth 2 (two) times daily with a meal.     LANTUS SOLOSTAR 100 UNIT/ML Solostar Pen  Generic drug:  Insulin Glargine  Inject 80 Units into the skin daily.        Allergies: No Known Allergies  Family History: Family History  Problem Relation Age of Onset  . Diabetes Mother   . Kidney disease Mother   . Breast cancer Mother     Social History:  reports that he has never smoked. He does not  have any smokeless tobacco history on file. He reports that he does not drink alcohol or use illicit drugs.  ROS: UROLOGY Frequent Urination?: No Hard to postpone urination?: No Burning/pain with urination?: No Get up at night to urinate?: No Leakage of urine?: No Urine stream starts and stops?: No Trouble starting stream?: No Do you have to strain to urinate?: No Blood in urine?: No Urinary tract infection?: No Sexually transmitted disease?: No Injury to kidneys or bladder?: No Painful intercourse?: No Weak stream?: No Erection problems?: No Penile pain?: No  Gastrointestinal Nausea?: No Vomiting?: No Indigestion/heartburn?: No Diarrhea?: No Constipation?: Yes  Constitutional Fever: No Night sweats?: No Weight loss?: No Fatigue?: No  Skin Skin  rash/lesions?: No Itching?: Yes  Eyes Blurred vision?: Yes Double vision?: No  Ears/Nose/Throat Sore throat?: No Sinus problems?: Yes  Hematologic/Lymphatic Swollen glands?: No Easy bruising?: No  Cardiovascular Leg swelling?: No Chest pain?: No  Respiratory Cough?: Yes Shortness of breath?: No  Endocrine Excessive thirst?: No  Musculoskeletal Back pain?: No Joint pain?: No  Neurological Headaches?: No Dizziness?: No  Psychologic Depression?: No Anxiety?: No  Physical Exam: BP 159/75 mmHg  Pulse 83  Ht 5\' 7"  (1.702 m)  Wt 240 lb (108.863 kg)  BMI 37.58 kg/m2  Constitutional:  Alert and oriented, No acute distress. HEENT: [place] AT, moist mucus membranes.  Trachea midline, no masses. Cardiovascular: No clubbing, cyanosis, or edema.  RRR. Respiratory: Normal respiratory effort, no increased work of breathing.  CTAB. GI: Abdomen is soft, nontender, nondistended, no abdominal masses GU: No CVA tenderness. Obese. No abdominal scars noted. Skin: No rashes, bruises or suspicious lesions. Neurologic: Grossly intact, no focal deficits, moving all extremities. Psychiatric: Normal mood and affect.  Laboratory Data: Lab Results  Component Value Date   WBC 9.9 02/07/2015   HGB 12.2* 02/17/2015   HCT 36.0* 02/17/2015   MCV 85.0 02/07/2015   PLT 199 02/07/2015    Lab Results  Component Value Date   CREATININE 6.02* 02/07/2015   Pertinent Imaging: Study Result     CLINICAL DATA: Followup renal mass  EXAM: CT ABDOMEN WITHOUT AND WITH CONTRAST  TECHNIQUE: Multidetector CT imaging of the abdomen was performed following the standard protocol before and following the bolus administration of intravenous contrast.  CONTRAST: 128mL OMNIPAQUE IOHEXOL 350 MG/ML SOLN  COMPARISON: 07/21/2014.  FINDINGS: Lower chest: There is no pleural fluid identified. The lung bases are clear.  Hepatobiliary: There is no suspicious liver abnormality  identified. Stones are noted within the dependent portion of the gallbladder. No gallbladder wall thickening. No biliary dilatation.  Pancreas: Normal appearance of the pancreas.  Spleen: The spleen appears normal.  Adrenals/Urinary Tract: Normal appearance of the adrenal glands. Solid enhancing mass arising from the lateral cortex of the left kidney measures 4 x 3.8 x 3.9 cm, image 56 of series 5. On the previous exam this measured 3.3 x 3.1 x 3.2 cm. No right kidney lesion identified. The renal veins appear patent bilaterally.  Stomach/Bowel: The stomach is normal. The small bowel loops have a normal course and caliber. The appendix is visualized and appears normal. No pathologic dilatation of the large bowel loops.  Vascular/Lymphatic: Calcified atherosclerotic disease involves the abdominal aorta. No aneurysm. Borderline periaortic lymph node posterior to the left renal artery measures 9 mm, image number 50 of series 5. Unchanged from previous exam. The other index periaortic lymph node measures 8 mm, image 64 of series 5. Also stable.  Other: No free fluid or fluid collections identified within  the upper abdomen.  Musculoskeletal: No aggressive lytic or sclerotic bone lesions.  IMPRESSION: 1. There has been interval increase in size of the solid enhancing mass arising from the a left kidney which is worrisome for a renal cell carcinoma. 2. Stable sub cm periaortic lymph nodes 3. Gallstones 4. Aortic atherosclerosis.   Electronically Signed  By: Kerby Moors M.D.  On: 06/01/2015 11:23   Imaging personally reviewed today and with the patient on his daughter via face time.   Assessment & Plan:    1. Renal mass Left exophytic renal mass, now increasing in size up to 3.9 cm. This growth rate is highly concerning for malignancy. Previously, were never able to fully characterize the mass due to his inability to receive IV contrast.  Recent follow-up CT scan  shows a 4 cm left partially exophytic left lateral mid pole and single renal mass highly suspicious for RCC. No evidence of renal vein invasion. No evidence of obvious metastatic disease although there is a borderline 9 mm paraaortic lymph node posterior to the left renal vein stable x 1 year. We'll assess intraoperatively.  I have recommended left hand assisted laparoscopic radical nephrectomy. There is no indication for nephron sparing surgery given his history of end-stage renal disease currently on hemodialysis.  Preoperative, intraoperative and postoperative course were discussed.  Risks of surgery were reviewed in detail today. These include risk of bleeding, infection, damage to surrounding structures, along with other major complications such as heart attack, stroke, and even death. Patient understands this and is agreeable to proceed.  Patient does have a known history of EKG abnormality and previously underwent cardiac clearance for this. He will need to be reassessed by our anesthesia colleagues prior to surgery. He is relatively high risk given his history of severely uncontrolled diabetes, CAD, end-stage renal disease, morbid obesity, and OSA.  2. ESRD on dialysis [facility]) Surgery will need to be on a Tuesday to maintain his Monday/[place]/Friday hemodialysis schedule. We will inform local nephrologist of his admission and arrange for dialysis on postop day 1.  3. Morbid obesity, unspecified obesity type ([place]) Advised continued weight loss to improved surgical outcome   I spent 45 min with this patient of which greater than 50% was spent in counseling and coordination of care with the patient.    Hollice Espy, MD  [facility] [address] [place], [place] [address] [phone]

## 2015-06-23 ENCOUNTER — Telehealth: Payer: Self-pay | Admitting: Radiology

## 2015-06-23 NOTE — Telephone Encounter (Signed)
Notified pt of surgery scheduled 07/11/15, pre-admit testing appt on 06/27/15 @ 8:15 and to call day prior to surgery for arrival time to SDS. Advised pt he has an appt with Dr Nehemiah Massed on 07/04/15 @1 :65 for cardiac clearance prior to surgery. Pt voices understanding.

## 2015-06-23 NOTE — Telephone Encounter (Signed)
LMOM. Need to notify pt of surgery information & appt with Dr Nehemiah Massed.

## 2015-06-27 ENCOUNTER — Encounter
Admission: RE | Admit: 2015-06-27 | Discharge: 2015-06-27 | Disposition: A | Discharge status: Home / Self Care | Payer: Medicare Other | Source: Ambulatory Visit | Attending: Urology | Admitting: Urology

## 2015-06-27 DIAGNOSIS — Z01812 Encounter for preprocedural laboratory examination: Secondary | ICD-10-CM | POA: Diagnosis not present

## 2015-06-27 HISTORY — DX: Malignant (primary) neoplasm, unspecified: C80.1

## 2015-06-27 HISTORY — DX: Acquired absence of unspecified leg below knee: Z89.519

## 2015-06-27 HISTORY — DX: Other specified postprocedural states: R11.2

## 2015-06-27 HISTORY — DX: Unspecified osteoarthritis, unspecified site: M19.90

## 2015-06-27 HISTORY — DX: Polyneuropathy, unspecified: G62.9

## 2015-06-27 HISTORY — DX: Other specified postprocedural states: Z98.890

## 2015-06-27 LAB — BASIC METABOLIC PANEL
Anion gap: 10 (ref 5–15)
BUN: 54 mg/dL — ABNORMAL HIGH (ref 6–20)
CO2: 29 mmol/L (ref 22–32)
Calcium: 8.7 mg/dL — ABNORMAL LOW (ref 8.9–10.3)
Chloride: 100 mmol/L — ABNORMAL LOW (ref 101–111)
Creatinine, Ser: 7.32 mg/dL — ABNORMAL HIGH (ref 0.61–1.24)
GFR calc Af Amer: 9 mL/min — ABNORMAL LOW (ref >60)
GFR calc non Af Amer: 8 mL/min — ABNORMAL LOW (ref >60)
Glucose, Bld: 136 mg/dL — ABNORMAL HIGH (ref 65–99)
Potassium: 4.2 mmol/L (ref 3.5–5.1)
Sodium: 139 mmol/L (ref 135–145)

## 2015-06-27 LAB — URINALYSIS COMPLETE WITH MICROSCOPIC (ARMC ONLY)
Bacteria, UA: NONE SEEN
Bilirubin Urine: NEGATIVE
Glucose, UA: NEGATIVE mg/dL
Hgb urine dipstick: NEGATIVE
Ketones, ur: NEGATIVE mg/dL
Leukocytes, UA: NEGATIVE
Nitrite: NEGATIVE
Protein, ur: 100 mg/dL — AB
Specific Gravity, Urine: 1.016 (ref 1.005–1.030)
pH: 5 (ref 5.0–8.0)

## 2015-06-27 LAB — CBC
HCT: 37.7 % — ABNORMAL LOW (ref 40.0–52.0)
Hemoglobin: 12.1 g/dL — ABNORMAL LOW (ref 13.0–18.0)
MCH: 28.7 pg (ref 26.0–34.0)
MCHC: 32 g/dL (ref 32.0–36.0)
MCV: 89.6 fL (ref 80.0–100.0)
Platelets: 264 10*3/uL (ref 150–440)
RBC: 4.2 MIL/uL — ABNORMAL LOW (ref 4.40–5.90)
RDW: 17.8 % — ABNORMAL HIGH (ref 11.5–14.5)
WBC: 8.1 10*3/uL (ref 3.8–10.6)

## 2015-06-27 LAB — PROTIME-INR
INR: 1.06
Prothrombin Time: 14 seconds (ref 11.4–15.0)

## 2015-06-27 LAB — TYPE AND SCREEN
ABO/RH(D): O POS
Antibody Screen: NEGATIVE

## 2015-06-27 LAB — APTT: aPTT: 30 seconds (ref 24–36)

## 2015-06-27 NOTE — Pre-Procedure Instructions (Signed)
EKG from Nov. 8, 2016 given to [facility], anesthesia nurse to review

## 2015-06-27 NOTE — Patient Instructions (Signed)
  Your procedure is scheduled [id number] 11, 2017 (Tuesday) Report to Day Surgery.[facility]) Second Floor To find out your arrival time please call [phone] between 1PM - 3PM on April 10 ,2017 (Monday).  Remember: Instructions that are not followed completely may result in serious medical risk, up to and including death, or upon the discretion of your surgeon and anesthesiologist your surgery may need to be rescheduled.    ___x_ 1. Do not eat food or drink liquids after midnight. No gum chewing or hard candies.     ____ 2. No Alcohol for 24 hours before or after surgery.   ____ 3. Bring all medications with you on the day of surgery if instructed.    __x__ 4. Notify your doctor if there is any change in your medical condition     (cold, fever, infections).     Do not wear jewelry, make-up, hairpins, clips or nail polish.  Do not wear lotions, powders, or perfumes. You may wear deodorant.  Do not shave 48 hours prior to surgery. Men may shave face and neck.  Do not bring valuables to the hospital.    [facility] is not responsible for any belongings or valuables.               Contacts, dentures or bridgework may not be worn into surgery.  Leave your suitcase in the car. After surgery it may be brought to your room.  For patients admitted to the hospital, discharge time is determined by your                treatment team.   Patients discharged the day of surgery will not be allowed to drive home.   Please read over the following fact sheets that you were given:   Surgical Site Infection Prevention   ____ Take these medicines the morning of surgery with A SIP OF WATER:    1. Coreg  2.   3.   4.  5.  6.  ____ Fleet Enema (as directed)   __x__ Use CHG Soap as directed (SAGE WIPES)  ____ Use inhalers on the day of surgery  ____ Stop metformin 2 days prior to surgery    __x__ Take 1/2 of usual insulin dose the night before surgery and none on the morning of surgery.  (NO INSULIN THE MORNING OF SURGERY)  __x__ Stop Coumadin/Plavix/aspirin on (N/A)  __x__ Stop Anti-inflammatories on (NO NSAIDS)   ____ Stop supplements until after surgery.    _x___ Bring C-Pap to the hospital.

## 2015-06-28 LAB — URINE CULTURE: Culture: NO GROWTH

## 2015-07-05 NOTE — Pre-Procedure Instructions (Signed)
Cardiac clearance on chart from Dr Kowalski ?

## 2015-07-05 NOTE — Telephone Encounter (Signed)
Notified pt he has been cleared by Dr Nehemiah Massed for surgery & to hold ASA 81mg  beginning now. Pt states he hasn't taken ASA in "a while" but voices understanding.

## 2015-07-11 ENCOUNTER — Encounter
Admission: RE | Disposition: A | Discharge status: Home / Self Care | Payer: Self-pay | Source: Ambulatory Visit | Attending: Urology

## 2015-07-11 ENCOUNTER — Encounter: Payer: Self-pay | Admitting: Anesthesiology

## 2015-07-11 ENCOUNTER — Inpatient Hospital Stay: Payer: Medicare Other | Admitting: Anesthesiology

## 2015-07-11 ENCOUNTER — Inpatient Hospital Stay
Admission: RE | Admit: 2015-07-11 | Discharge: 2015-07-13 | DRG: 656 | Disposition: A | Discharge status: Home / Self Care | Payer: Medicare Other | Source: Ambulatory Visit | Attending: Urology | Admitting: Urology

## 2015-07-11 DIAGNOSIS — Z833 Family history of diabetes mellitus: Secondary | ICD-10-CM | POA: Diagnosis not present

## 2015-07-11 DIAGNOSIS — Z89511 Acquired absence of right leg below knee: Secondary | ICD-10-CM

## 2015-07-11 DIAGNOSIS — G4733 Obstructive sleep apnea (adult) (pediatric): Secondary | ICD-10-CM | POA: Diagnosis present

## 2015-07-11 DIAGNOSIS — I2510 Atherosclerotic heart disease of native coronary artery without angina pectoris: Secondary | ICD-10-CM | POA: Diagnosis present

## 2015-07-11 DIAGNOSIS — Z6837 Body mass index (BMI) 37.0-37.9, adult: Secondary | ICD-10-CM

## 2015-07-11 DIAGNOSIS — Z992 Dependence on renal dialysis: Secondary | ICD-10-CM | POA: Diagnosis not present

## 2015-07-11 DIAGNOSIS — E1165 Type 2 diabetes mellitus with hyperglycemia: Secondary | ICD-10-CM | POA: Diagnosis present

## 2015-07-11 DIAGNOSIS — E1122 Type 2 diabetes mellitus with diabetic chronic kidney disease: Secondary | ICD-10-CM | POA: Diagnosis present

## 2015-07-11 DIAGNOSIS — N2581 Secondary hyperparathyroidism of renal origin: Secondary | ICD-10-CM | POA: Diagnosis present

## 2015-07-11 DIAGNOSIS — I120 Hypertensive chronic kidney disease with stage 5 chronic kidney disease or end stage renal disease: Secondary | ICD-10-CM | POA: Diagnosis present

## 2015-07-11 DIAGNOSIS — E039 Hypothyroidism, unspecified: Secondary | ICD-10-CM | POA: Diagnosis present

## 2015-07-11 DIAGNOSIS — Z841 Family history of disorders of kidney and ureter: Secondary | ICD-10-CM

## 2015-07-11 DIAGNOSIS — C642 Malignant neoplasm of left kidney, except renal pelvis: Principal | ICD-10-CM | POA: Diagnosis present

## 2015-07-11 DIAGNOSIS — N2889 Other specified disorders of kidney and ureter: Secondary | ICD-10-CM | POA: Diagnosis present

## 2015-07-11 DIAGNOSIS — Z803 Family history of malignant neoplasm of breast: Secondary | ICD-10-CM

## 2015-07-11 DIAGNOSIS — Z955 Presence of coronary angioplasty implant and graft: Secondary | ICD-10-CM

## 2015-07-11 DIAGNOSIS — Z794 Long term (current) use of insulin: Secondary | ICD-10-CM | POA: Diagnosis not present

## 2015-07-11 DIAGNOSIS — D631 Anemia in chronic kidney disease: Secondary | ICD-10-CM | POA: Diagnosis present

## 2015-07-11 DIAGNOSIS — N186 End stage renal disease: Secondary | ICD-10-CM | POA: Diagnosis present

## 2015-07-11 HISTORY — PX: LAPAROSCOPIC NEPHRECTOMY, HAND ASSISTED: SHX1929

## 2015-07-11 LAB — GLUCOSE, CAPILLARY
Glucose-Capillary: 182 mg/dL — ABNORMAL HIGH (ref 65–99)
Glucose-Capillary: 196 mg/dL — ABNORMAL HIGH (ref 65–99)
Glucose-Capillary: 245 mg/dL — ABNORMAL HIGH (ref 65–99)
Glucose-Capillary: 226 mg/dL — ABNORMAL HIGH (ref 65–99)
Glucose-Capillary: 214 mg/dL — ABNORMAL HIGH (ref 65–99)
Glucose-Capillary: 130 mg/dL — ABNORMAL HIGH (ref 65–99)

## 2015-07-11 LAB — POCT I-STAT 4, (NA,K, GLUC, HGB,HCT)
Glucose, Bld: 176 mg/dL — ABNORMAL HIGH (ref 65–99)
HCT: 41 % (ref 39.0–52.0)
Hemoglobin: 13.9 g/dL (ref 13.0–17.0)
Potassium: 4.3 mmol/L (ref 3.5–5.1)
Sodium: 141 mmol/L (ref 135–145)

## 2015-07-11 LAB — BASIC METABOLIC PANEL
Anion gap: 8 (ref 5–15)
BUN: 66 mg/dL — ABNORMAL HIGH (ref 6–20)
CO2: 26 mmol/L (ref 22–32)
Calcium: 7.8 mg/dL — ABNORMAL LOW (ref 8.9–10.3)
Chloride: 100 mmol/L — ABNORMAL LOW (ref 101–111)
Creatinine, Ser: 7.84 mg/dL — ABNORMAL HIGH (ref 0.61–1.24)
GFR calc Af Amer: 8 mL/min — ABNORMAL LOW (ref >60)
GFR calc non Af Amer: 7 mL/min — ABNORMAL LOW (ref >60)
Glucose, Bld: 256 mg/dL — ABNORMAL HIGH (ref 65–99)
Potassium: 6.4 mmol/L — ABNORMAL HIGH (ref 3.5–5.1)
Sodium: 134 mmol/L — ABNORMAL LOW (ref 135–145)

## 2015-07-11 LAB — CBC
HCT: 37.2 % — ABNORMAL LOW (ref 40.0–52.0)
Hemoglobin: 11.9 g/dL — ABNORMAL LOW (ref 13.0–18.0)
MCH: 28.5 pg (ref 26.0–34.0)
MCHC: 32.1 g/dL (ref 32.0–36.0)
MCV: 88.8 fL (ref 80.0–100.0)
Platelets: 220 10*3/uL (ref 150–440)
RBC: 4.19 MIL/uL — ABNORMAL LOW (ref 4.40–5.90)
RDW: 18.2 % — ABNORMAL HIGH (ref 11.5–14.5)
WBC: 14.8 10*3/uL — ABNORMAL HIGH (ref 3.8–10.6)

## 2015-07-11 SURGERY — NEPHRECTOMY, HAND-ASSISTED, LAPAROSCOPIC
Anesthesia: General | Laterality: Left | Wound class: Clean Contaminated

## 2015-07-11 MED ORDER — LIDOCAINE HCL (CARDIAC) 20 MG/ML IV SOLN
INTRAVENOUS | Status: DC | PRN
  Administered 2015-07-11: 100 mg via INTRAVENOUS

## 2015-07-11 MED ORDER — MANNITOL 25 % IV SOLN
INTRAVENOUS | Status: AC
  Filled 2015-07-11: qty 100

## 2015-07-11 MED ORDER — CARVEDILOL 12.5 MG PO TABS
12.5000 mg | ORAL_TABLET | Freq: Two times a day (BID) | ORAL | Status: DC
  Administered 2015-07-11 – 2015-07-13 (×2): 12.5 mg via ORAL
  Filled 2015-07-11 (×2): qty 1

## 2015-07-11 MED ORDER — FAMOTIDINE 20 MG PO TABS
ORAL_TABLET | ORAL | Status: AC
  Administered 2015-07-11: 20 mg via ORAL
  Filled 2015-07-11: qty 1

## 2015-07-11 MED ORDER — INSULIN GLARGINE 100 UNIT/ML SOLOSTAR PEN: 80.0000 [IU] | PEN_INJECTOR | Freq: Every day | SUBCUTANEOUS | Status: DC

## 2015-07-11 MED ORDER — SODIUM CHLORIDE 0.9 % IV SOLN
INTRAVENOUS | Status: DC
Start: 2015-07-11 — End: 2015-07-11
  Administered 2015-07-11: 500 mL via INTRAVENOUS

## 2015-07-11 MED ORDER — INSULIN ASPART 100 UNIT/ML ~~LOC~~ SOLN
SUBCUTANEOUS | Status: AC
  Administered 2015-07-11: 4 [IU] via SUBCUTANEOUS
  Filled 2015-07-11: qty 4

## 2015-07-11 MED ORDER — ONDANSETRON HCL 4 MG/2ML IJ SOLN
INTRAMUSCULAR | Status: DC | PRN
  Administered 2015-07-11: 4 mg via INTRAVENOUS

## 2015-07-11 MED ORDER — INSULIN GLARGINE 100 UNIT/ML ~~LOC~~ SOLN
80.0000 [IU] | SUBCUTANEOUS | Status: DC
  Filled 2015-07-11: qty 0.8

## 2015-07-11 MED ORDER — BUPIVACAINE HCL 0.5 % IJ SOLN
INTRAMUSCULAR | Status: DC | PRN
  Administered 2015-07-11: 18 mL

## 2015-07-11 MED ORDER — DIPHENHYDRAMINE HCL 12.5 MG/5ML PO ELIX: 12.5000 mg | ORAL_SOLUTION | Freq: Four times a day (QID) | ORAL | Status: DC | PRN

## 2015-07-11 MED ORDER — LABETALOL HCL 5 MG/ML IV SOLN
INTRAVENOUS | Status: AC
  Administered 2015-07-11: 10 mg via INTRAVENOUS
  Filled 2015-07-11: qty 4

## 2015-07-11 MED ORDER — FENTANYL CITRATE (PF) 100 MCG/2ML IJ SOLN
25.0000 ug | INTRAMUSCULAR | Status: DC | PRN
  Administered 2015-07-11: 25 ug via INTRAVENOUS

## 2015-07-11 MED ORDER — DOCUSATE SODIUM 100 MG PO CAPS
100.0000 mg | ORAL_CAPSULE | Freq: Two times a day (BID) | ORAL | Status: DC
  Administered 2015-07-11 – 2015-07-12 (×3): 100 mg via ORAL
  Filled 2015-07-11 (×3): qty 1

## 2015-07-11 MED ORDER — INSULIN ASPART 100 UNIT/ML ~~LOC~~ SOLN
4.0000 [IU] | Freq: Once | SUBCUTANEOUS | Status: AC
  Administered 2015-07-11: 4 [IU] via SUBCUTANEOUS

## 2015-07-11 MED ORDER — PHENYLEPHRINE HCL 10 MG/ML IJ SOLN
INTRAMUSCULAR | Status: DC | PRN
  Administered 2015-07-11 (×2): 100 ug via INTRAVENOUS

## 2015-07-11 MED ORDER — BUPIVACAINE LIPOSOME 1.3 % IJ SUSP
INTRAMUSCULAR | Status: AC
  Filled 2015-07-11: qty 20

## 2015-07-11 MED ORDER — THROMBIN 5000 UNITS EX SOLR
CUTANEOUS | Status: AC
  Filled 2015-07-11: qty 5000

## 2015-07-11 MED ORDER — SODIUM CHLORIDE 0.9 % IV SOLN
10000.0000 ug | INTRAVENOUS | Status: DC | PRN
  Administered 2015-07-11: 20 ug/min via INTRAVENOUS

## 2015-07-11 MED ORDER — MIDAZOLAM HCL 2 MG/2ML IJ SOLN
INTRAMUSCULAR | Status: DC | PRN
  Administered 2015-07-11: 2 mg via INTRAVENOUS

## 2015-07-11 MED ORDER — CEFAZOLIN SODIUM 1-5 GM-% IV SOLN
1.0000 g | Freq: Three times a day (TID) | INTRAVENOUS | Status: DC
  Filled 2015-07-11 (×2): qty 50

## 2015-07-11 MED ORDER — ROCURONIUM BROMIDE 100 MG/10ML IV SOLN
INTRAVENOUS | Status: DC | PRN
  Administered 2015-07-11: 10 mg via INTRAVENOUS
  Administered 2015-07-11: 5 mg via INTRAVENOUS
  Administered 2015-07-11 (×2): 10 mg via INTRAVENOUS
  Administered 2015-07-11: 30 mg via INTRAVENOUS
  Administered 2015-07-11: 10 mg via INTRAVENOUS

## 2015-07-11 MED ORDER — OXYCODONE-ACETAMINOPHEN 5-325 MG PO TABS
1.0000 | ORAL_TABLET | ORAL | Status: DC | PRN
  Administered 2015-07-13: 1 via ORAL
  Filled 2015-07-11: qty 1

## 2015-07-11 MED ORDER — METOCLOPRAMIDE HCL 5 MG/ML IJ SOLN
INTRAMUSCULAR | Status: DC | PRN
  Administered 2015-07-11: 5 mg via INTRAVENOUS

## 2015-07-11 MED ORDER — FAMOTIDINE 20 MG PO TABS
20.0000 mg | ORAL_TABLET | Freq: Once | ORAL | Status: AC
  Administered 2015-07-11: 20 mg via ORAL

## 2015-07-11 MED ORDER — SODIUM CHLORIDE 0.9 % IV SOLN
INTRAVENOUS | Status: DC | PRN
  Administered 2015-07-11: 10:00:00 via INTRAVENOUS

## 2015-07-11 MED ORDER — FENTANYL CITRATE (PF) 100 MCG/2ML IJ SOLN
INTRAMUSCULAR | Status: DC | PRN
  Administered 2015-07-11 (×4): 50 ug via INTRAVENOUS
  Administered 2015-07-11: 100 ug via INTRAVENOUS
  Administered 2015-07-11 (×2): 50 ug via INTRAVENOUS

## 2015-07-11 MED ORDER — ACETAMINOPHEN 325 MG PO TABS: 650.0000 mg | ORAL_TABLET | ORAL | Status: DC | PRN

## 2015-07-11 MED ORDER — HEPARIN SODIUM (PORCINE) 5000 UNIT/ML IJ SOLN
5000.0000 [IU] | Freq: Three times a day (TID) | INTRAMUSCULAR | Status: DC
  Administered 2015-07-11 – 2015-07-13 (×5): 5000 [IU] via SUBCUTANEOUS
  Filled 2015-07-11 (×5): qty 1

## 2015-07-11 MED ORDER — INSULIN ASPART 100 UNIT/ML ~~LOC~~ SOLN
0.0000 [IU] | Freq: Three times a day (TID) | SUBCUTANEOUS | Status: DC
  Administered 2015-07-11 – 2015-07-12 (×3): 5 [IU] via SUBCUTANEOUS
  Administered 2015-07-13: 3 [IU] via SUBCUTANEOUS
  Administered 2015-07-13 (×2): 5 [IU] via SUBCUTANEOUS
  Filled 2015-07-11 (×5): qty 5
  Filled 2015-07-11: qty 3

## 2015-07-11 MED ORDER — ONDANSETRON HCL 4 MG/2ML IJ SOLN
4.0000 mg | INTRAMUSCULAR | Status: DC | PRN
  Administered 2015-07-11 – 2015-07-12 (×2): 4 mg via INTRAVENOUS
  Filled 2015-07-11 (×2): qty 2

## 2015-07-11 MED ORDER — CEFAZOLIN SODIUM-DEXTROSE 2-4 GM/100ML-% IV SOLN
INTRAVENOUS | Status: AC
  Filled 2015-07-11: qty 100

## 2015-07-11 MED ORDER — LABETALOL HCL 5 MG/ML IV SOLN
10.0000 mg | INTRAVENOUS | Status: DC | PRN
  Administered 2015-07-11: 10 mg via INTRAVENOUS

## 2015-07-11 MED ORDER — INSULIN GLARGINE 100 UNIT/ML ~~LOC~~ SOLN
80.0000 [IU] | SUBCUTANEOUS | Status: DC
  Filled 2015-07-11 (×3): qty 0.8

## 2015-07-11 MED ORDER — ONDANSETRON HCL 4 MG/2ML IJ SOLN: 4.0000 mg | Freq: Once | INTRAMUSCULAR | Status: DC | PRN

## 2015-07-11 MED ORDER — INSULIN GLARGINE 100 UNIT/ML ~~LOC~~ SOLN
80.0000 [IU] | Freq: Every day | SUBCUTANEOUS | Status: DC
  Filled 2015-07-11 (×2): qty 0.8

## 2015-07-11 MED ORDER — CALCIUM ACETATE (PHOS BINDER) 667 MG PO CAPS
2001.0000 mg | ORAL_CAPSULE | Freq: Three times a day (TID) | ORAL | Status: DC
  Administered 2015-07-11 – 2015-07-13 (×5): 2001 mg via ORAL
  Filled 2015-07-11 (×5): qty 3

## 2015-07-11 MED ORDER — NEOSTIGMINE METHYLSULFATE 10 MG/10ML IV SOLN
INTRAVENOUS | Status: DC | PRN
  Administered 2015-07-11: 5 mg via INTRAVENOUS

## 2015-07-11 MED ORDER — BUPIVACAINE HCL (PF) 0.5 % IJ SOLN
INTRAMUSCULAR | Status: AC
  Filled 2015-07-11: qty 30

## 2015-07-11 MED ORDER — GLYCOPYRROLATE 0.2 MG/ML IJ SOLN
INTRAMUSCULAR | Status: DC | PRN
  Administered 2015-07-11: .8 mg via INTRAVENOUS
  Administered 2015-07-11: 0.2 mg via INTRAVENOUS

## 2015-07-11 MED ORDER — FENTANYL CITRATE (PF) 100 MCG/2ML IJ SOLN
INTRAMUSCULAR | Status: AC
  Filled 2015-07-11: qty 2

## 2015-07-11 MED ORDER — DIPHENHYDRAMINE HCL 50 MG/ML IJ SOLN: 12.5000 mg | Freq: Four times a day (QID) | INTRAMUSCULAR | Status: DC | PRN

## 2015-07-11 MED ORDER — SUCCINYLCHOLINE CHLORIDE 20 MG/ML IJ SOLN
INTRAMUSCULAR | Status: DC | PRN
  Administered 2015-07-11: 80 mg via INTRAVENOUS
  Administered 2015-07-11: 120 mg via INTRAVENOUS

## 2015-07-11 MED ORDER — CEFAZOLIN SODIUM-DEXTROSE 2-4 GM/100ML-% IV SOLN
2.0000 g | Freq: Once | INTRAVENOUS | Status: AC
  Administered 2015-07-11: 2 g via INTRAVENOUS

## 2015-07-11 MED ORDER — HYDROMORPHONE HCL 1 MG/ML IJ SOLN
0.5000 mg | INTRAMUSCULAR | Status: DC | PRN
  Administered 2015-07-11: 0.5 mg via INTRAVENOUS
  Administered 2015-07-11 – 2015-07-12 (×3): 1 mg via INTRAVENOUS
  Filled 2015-07-11 (×4): qty 1

## 2015-07-11 MED ORDER — PROPOFOL 10 MG/ML IV BOLUS
INTRAVENOUS | Status: DC | PRN
  Administered 2015-07-11: 170 mg via INTRAVENOUS
  Administered 2015-07-11: 30 mg via INTRAVENOUS

## 2015-07-11 SURGICAL SUPPLY — 71 items
ANCHOR TIS RET SYS 1550ML (BAG) ×2 IMPLANT
APPLICATOR SURGIFLO (MISCELLANEOUS) IMPLANT
APPLIER CLIP ROT 10 11.4 M/L (STAPLE) ×2
APPLIER CLIP ROT 13.4 12 LRG (CLIP) ×2
CANISTER SUCT 1200ML W/VALVE (MISCELLANEOUS) ×2 IMPLANT
CATH TRAY 16F METER LATEX (MISCELLANEOUS) ×2 IMPLANT
CLEANER CAUTERY TIP 5X5 PAD (MISCELLANEOUS) ×1 IMPLANT
CLIP APPLIE ROT 10 11.4 M/L (STAPLE) ×1 IMPLANT
CLIP APPLIE ROT 13.4 12 LRG (CLIP) ×1 IMPLANT
CORD BIP STRL DISP 12FT (MISCELLANEOUS) ×2 IMPLANT
DEFOGGER SCOPE WARMER CLEARIFY (MISCELLANEOUS) ×2 IMPLANT
DISSECTOR KITTNER STICK (MISCELLANEOUS) ×2 IMPLANT
DISSECTORS/KITTNER STICK (MISCELLANEOUS) ×4
DRAPE INCISE IOBAN 66X45 STRL (DRAPES) ×2 IMPLANT
DRAPE SURG 17X11 SM STRL (DRAPES) ×8 IMPLANT
DRSG TEGADERM 2-3/8X2-3/4 SM (GAUZE/BANDAGES/DRESSINGS) IMPLANT
DRSG TEGADERM 4X4.75 (GAUZE/BANDAGES/DRESSINGS) IMPLANT
DRSG TELFA 3X8 NADH (GAUZE/BANDAGES/DRESSINGS) IMPLANT
ELECT REM PT RETURN 9FT ADLT (ELECTROSURGICAL) ×2
ELECTRODE REM PT RTRN 9FT ADLT (ELECTROSURGICAL) ×1 IMPLANT
GELPORT LAPAROSCOPIC (MISCELLANEOUS) ×2 IMPLANT
GLOVE BIO SURGEON STRL SZ 6.5 (GLOVE) ×4 IMPLANT
GLOVE INDICATOR 6.5 STRL GRN (GLOVE) ×2 IMPLANT
GOWN STRL REUS W/ TWL LRG LVL3 (GOWN DISPOSABLE) ×3 IMPLANT
GOWN STRL REUS W/TWL LRG LVL3 (GOWN DISPOSABLE) ×3
GRASPER SUT TROCAR 14GX15 (MISCELLANEOUS) ×2 IMPLANT
HANDLE YANKAUER SUCT BULB TIP (MISCELLANEOUS) ×2 IMPLANT
IRRIGATION STRYKERFLOW (MISCELLANEOUS) ×1 IMPLANT
IRRIGATOR STRYKERFLOW (MISCELLANEOUS) ×2
IV NS 1000ML (IV SOLUTION) ×1
IV NS 1000ML BAXH (IV SOLUTION) ×1 IMPLANT
KIT RM TURNOVER STRD PROC AR (KITS) ×2 IMPLANT
L-HOOK LAP DISP 36CM (ELECTROSURGICAL) ×2
LABEL OR SOLS (LABEL) ×2 IMPLANT
LHOOK LAP DISP 36CM (ELECTROSURGICAL) ×1 IMPLANT
LIGASURE LAP ATLAS 10MM 37CM (INSTRUMENTS) ×2 IMPLANT
LIGASURE MARYLAND LAP STAND (ELECTROSURGICAL) ×2 IMPLANT
LIQUID BAND (GAUZE/BANDAGES/DRESSINGS) ×2 IMPLANT
LOOP RED MAXI  1X406MM (MISCELLANEOUS) ×1
LOOP VESSEL MAXI 1X406 RED (MISCELLANEOUS) ×1 IMPLANT
NEEDLE HYPO 25X1 1.5 SAFETY (NEEDLE) ×4 IMPLANT
NEEDLE INSUFFLATION 14GA 120MM (NEEDLE) ×2 IMPLANT
PACK LAP CHOLECYSTECTOMY (MISCELLANEOUS) ×2 IMPLANT
PAD CLEANER CAUTERY TIP 5X5 (MISCELLANEOUS) ×1
PAD TRENDELENBURG OR TABLE (MISCELLANEOUS) ×2 IMPLANT
PENCIL ELECTRO HAND CTR (MISCELLANEOUS) ×2 IMPLANT
RELOAD STAPLE SKIN SM 35W (MISCELLANEOUS) ×2 IMPLANT
SCISSORS METZENBAUM CVD 33 (INSTRUMENTS) ×2 IMPLANT
SPOGE SURGIFLO 8M (HEMOSTASIS)
SPONGE LAP 18X18 5 PK (GAUZE/BANDAGES/DRESSINGS) ×2 IMPLANT
SPONGE SURGIFLO 8M (HEMOSTASIS) IMPLANT
STAPLE RELOAD 2.5MM WHITE (STAPLE) ×6 IMPLANT
STAPLER VASCULAR ECHELON 35 (CUTTER) ×2 IMPLANT
STRIP CLOSURE SKIN 1/2X4 (GAUZE/BANDAGES/DRESSINGS) IMPLANT
SURGILUBE 2OZ TUBE FLIPTOP (MISCELLANEOUS) IMPLANT
SUT CHROMIC 0 CT 1 (SUTURE) IMPLANT
SUT MNCRL AB 4-0 PS2 18 (SUTURE) ×4 IMPLANT
SUT PDS AB 0 CT1 27 (SUTURE) ×6 IMPLANT
SUT SILK 0 (SUTURE) ×1
SUT SILK 0 30XBRD TIE 6 (SUTURE) ×1 IMPLANT
SUT VIC AB 0 CT1 36 (SUTURE) ×4 IMPLANT
SUT VIC AB 1 CT1 36 (SUTURE) IMPLANT
SUT VIC AB 2-0 SH 27 (SUTURE) ×1
SUT VIC AB 2-0 SH 27XBRD (SUTURE) ×1 IMPLANT
SUT VIC AB 2-0 UR6 27 (SUTURE) ×2 IMPLANT
SUT VIC AB 4-0 FS2 27 (SUTURE) IMPLANT
TROCAR ENDOPATH XCEL 12X100 BL (ENDOMECHANICALS) ×2 IMPLANT
TROCAR XCEL 12X100 BLDLESS (ENDOMECHANICALS) ×2 IMPLANT
TROCAR XCEL NON-BLD 5MMX100MML (ENDOMECHANICALS) IMPLANT
TUBING INSUFFLATOR HI FLOW (MISCELLANEOUS) ×2 IMPLANT
WATER STERILE IRR 1000ML POUR (IV SOLUTION) ×2 IMPLANT

## 2015-07-11 NOTE — Anesthesia Postprocedure Evaluation (Signed)
Anesthesia Post Note  Patient: Tim Becker  Procedure(s) Performed: Procedure(s) (LRB): HAND ASSISTED LAPAROSCOPIC NEPHRECTOMY (Left)  Patient location during evaluation: PACU Anesthesia Type: General Level of consciousness: awake and alert and oriented Pain management: pain level controlled Vital Signs Assessment: post-procedure vital signs reviewed and stable Respiratory status: spontaneous breathing Cardiovascular status: blood pressure returned to baseline Anesthetic complications: no    Last Vitals:  Filed Vitals:   07/11/15 1541 07/11/15 1636  BP: 145/68 138/68  Pulse: 80 81  Temp: 36.8 C 36.9 C  Resp: 16 16    Last Pain:  Filed Vitals:   07/11/15 1637  PainSc: Asleep                 Trig Mcbryar

## 2015-07-11 NOTE — Anesthesia Preprocedure Evaluation (Addendum)
Anesthesia Evaluation  Patient identified by MRN, date of birth, ID band Patient awake    Reviewed: Allergy & Precautions, NPO status , Patient's Chart, lab work & pertinent test results, reviewed documented beta blocker date and time   History of Anesthesia Complications (+) PONV  Airway Mallampati: III  TM Distance: >3 FB     Dental  (+) Chipped   Pulmonary asthma , sleep apnea and Continuous Positive Airway Pressure Ventilation ,           Cardiovascular hypertension, Pt. on medications and Pt. on home beta blockers + CAD       Neuro/Psych    GI/Hepatic   Endo/Other  diabetesHypothyroidism   Renal/GU Renal disease     Musculoskeletal  (+) Arthritis ,   Abdominal   Peds  Hematology  (+) anemia ,   Anesthesia Other Findings Obese.No problem with asthma. IVs in R arm. Allen's test OK in R. Glu 182. Will use CPAP tonite.  Reproductive/Obstetrics                          Anesthesia Physical Anesthesia Plan  ASA: III  Anesthesia Plan: General   Post-op Pain Management:    Induction: Intravenous  Airway Management Planned: Oral ETT  Additional Equipment:   Intra-op Plan:   Post-operative Plan:   Informed Consent: I have reviewed the patients History and Physical, chart, labs and discussed the procedure including the risks, benefits and alternatives for the proposed anesthesia with the patient or authorized representative who has indicated his/her understanding and acceptance.     Plan Discussed with: CRNA  Anesthesia Plan Comments:         Anesthesia Quick Evaluation

## 2015-07-11 NOTE — Op Note (Signed)
PREOP DIAGNOSIS: Left renal mass  POSTOPERATIVE DIAGNOSIS: Left renal mass  OPERATION PERFORMED: Hand-assisted laparoscopic left radical nephrectomy.   SURGEON: Hollice Espy, MD   Assistant: Liliane Bade, MD   ANESTHESIA: General.   ESTIMATED BLOOD LOSS: 200 cc   DRAINS: 16-French Foley catheter.   COMPLICATIONS: None.  Indications: 4 cm left renal mass  FINDINGS: Hilar anatomy: Arteries: 1 Veins: 1  Ureters: 1   DESCRIPTION OF OPERATION: Informed consent was obtained. The patient was marked on the left side. IV antibiotics were given for bacterial prophylaxis on call to the operating room. SCDs were provided for DVT prophylaxis. The patient was taken to the operating room and placed supine on the operating table. General anesthesia was provided. A Foley catheter was placed to drain the bladder.  The patient was positioned in right lateral decubitus with the left flank elevated about 70 degrees and the table flexed slightly. The left arm was placed in a padded airplane for support. Axillary roll was positioned. The patient was secured to the table with soft straps and then prepped and draped sterilely.   We had a time-out confirming the patient identification, planned procedure, surgical site, and all present were in agreement. All present were in agreement.   A 8 cm incision was made for the hand port in the left lower quadrant. The anterior fascia was incised and elevated.  The epigastric vessels were tied off and ligated when encountered. The rectus belly was retracted medially and the peritoneum was incised. An incisional block was provided with liposomal Marcaine. The GelPort was assembled. A 12 mm trocar was placed superior and just latera to the umbilicus, and then the abdomen was insufflated. Laparoscopic survey revealed no abnormalities or injuries. An additional 12 mm trocar was just below  the subxiphoid laterally. All port sites were  then infiltrated with liposomal Marcaine. Zero Vicryls were placed at the 12 mm trocar sites with the Carter-Thomason device for closure at the end of the case.   The white line of Toldt was incised. The colon was reflected from the spleen to the pelvis. Gerota's fascia was lifted up off the lower pole of the kidney and the ureter and gonadal vessels were identified. The gonadal vein was exposed and followed up to the main renal vein. The branch point of the gonadal vein and adrenal vein were exposed at the renal vein. The upper pole of the kidney was mobilized off of the quadratus lumborum and further mobilized away from the spleen. The adrenal gland was identified and spared during the upper pole dissection. The lower pole and lateral attachments were freed. The kidney was held laterally and the hilar dissection was completed.    The ureter was exposed, clipped distally using 12 mm clips, and divided sharply. Of note, the ureter was very atretic.   The ureter stump was confirmed hemostatic.  At this point, the renal artery and vein was divided en block with a 45 mm vascular load staple using the battery operated endovascular stapler.  Of note, there was very fibrotic tissue around the hilum and posterior to the hilar area.  Several other staple fires were used to free the medial superior and posterior kidney.  An endocatch bag was then used to bag the specimen.  The kidney was extracted through the gel port and passed off for pathological analysis.    Pneumoperitoneal pressure was reduced to 7 mmHg and the abdomen was inspected; hemostasis was confirmed.  Surgiflo was placed in the renal fossa  and along the hilum. The 12 mm trocars were removed and the port sites closed with previously placed 0 Vicryl. The Gelport was removed. The anterior fascia was closed with a running 0 PDS x 2.  The subcutaneous tissue was closed using 3-0 vicryl. All the incisions were irrigated, patted dry, and  then the skin was reapproximated with 4-0 Monocryl in a subcuticular fashion. The wounds were cleaned and dried and covered with Dermabond. All sponge, needle, and instrument counts were reported correct x2.   The patient was awakened from anesthesia and transferred to recovery in stable condition. There were no complications. The patient tolerated the procedure well. ______________________________    Hollice Espy, MD

## 2015-07-11 NOTE — Interval H&P Note (Signed)
History and Physical Interval Note:  07/11/2015 7:48 AM  Tim Becker  has presented today for surgery, with the diagnosis of LEFT RENAL MASS  The various methods of treatment have been discussed with the patient and family. After consideration of risks, benefits and other options for treatment, the patient has consented to  Procedure(s): HAND ASSISTED LAPAROSCOPIC NEPHRECTOMY (Left) as a surgical intervention .  The patient's history has been reviewed, patient examined, no change in status, stable for surgery.  I have reviewed the patient's chart and labs.  Questions were answered to the patient's satisfaction.    RRR CTAB  Hollice Espy

## 2015-07-11 NOTE — Progress Notes (Signed)
Pharmacy Antibiotic Note  Marquavious Seacat is a 50 y.o. male admitted on 07/11/2015 with surgical prophylaxis.  Pharmacy has been consulted for cefazolin dosing.  Plan: Patient received cefazolin 2 g iv once pre-op which should cover the patient for 24-48 hours or until the next dialysis session. Will d/c order for cefazolin 1 g iv q 8 h x 2 postop.   Height: 5\' 7"  (170.2 cm) Weight: 242 lb (109.77 kg) IBW/kg (Calculated) : 66.1  Temp (24hrs), Avg:98.1 F (36.7 C), Min:97.8 F (36.6 C), Max:98.4 F (36.9 C)   Recent Labs Lab 07/11/15 1349  WBC 14.8*  CREATININE 7.84*    Estimated Creatinine Clearance: 13.3 mL/min (by C-G formula based on Cr of 7.84).    No Known Allergies   Thank you for allowing pharmacy to be a part of this patient's care.  Ulice Dash D 07/11/2015 3:26 PM

## 2015-07-11 NOTE — Progress Notes (Signed)
Patient has a right BKA with prosthesis.  Prosthesis removed for surgery.  SCD applied to left leg only.

## 2015-07-11 NOTE — Progress Notes (Signed)
[facility]  ROUNDING NOTE   Subjective:  The patient is well known to our practice as we previously followed him for CKD. He moved to [place] and switched to the [facility] for outpt HD. He presented today for electived hand assisted left radical nephrectomy for left renal mass. This was performed successfully. Patient had HD on Monday as a full treatment. He is due for HD again tomorrow.    Objective:  Vital signs in last 24 hours:  Temp:  [97.8 F (36.6 C)-98.4 F (36.9 C)] 98.4 F (36.9 C) (04/11 1636) Pulse Rate:  [68-92] 79 (04/11 1702) Resp:  [12-20] 16 (04/11 1636) BP: (128-175)/(62-84) 146/67 mmHg (04/11 1702) SpO2:  [89 %-100 %] 100 % (04/11 1636) Arterial Line BP: (157-213)/(68-78) 164/71 mmHg (04/11 1424) Weight:  [109.77 kg (242 lb)] 109.77 kg (242 lb) (04/11 0758)  Weight change:  Filed Weights   07/11/15 0758  Weight: 109.77 kg (242 lb)    Intake/Output:     Intake/Output this shift:  Total I/O In: 700 [I.V.:700] Out: 210 [Urine:10; Blood:200]  Physical Exam: General: NAD, resting in bed  Head: Normocephalic, atraumatic. Moist oral mucosal membranes  Eyes: Anicteric  Neck: Supple, trachea midline  Lungs:  Clear to auscultation  Heart: Regular rate and rhythm  Abdomen:  Soft, nontender, surgical scar noted  Extremities: no peripheral edema.  Neurologic: Nonfocal, moving all four extremities  Skin: No lesions  Access: LUE AVF with good bruit and thrill    Basic Metabolic Panel:  Recent Labs Lab 07/11/15 0746 07/11/15 1349  NA 141 134*  K 4.3 6.4*  CL  --  100*  CO2  --  26  GLUCOSE 176* 256*  BUN  --  66*  CREATININE  --  7.84*  CALCIUM  --  7.8*    Liver Function Tests: No results for input(s): AST, ALT, ALKPHOS, BILITOT, PROT, ALBUMIN in the last 168 hours. No results for input(s): LIPASE, AMYLASE in the last 168 hours. No results for input(s): AMMONIA in the last 168 hours.  CBC:  Recent Labs Lab  07/11/15 0746 07/11/15 1349  WBC  --  14.8*  HGB 13.9 11.9*  HCT 41.0 37.2*  MCV  --  88.8  PLT  --  220    Cardiac Enzymes: No results for input(s): CKTOTAL, CKMB, CKMBINDEX, TROPONINI in the last 168 hours.  BNP: Invalid input(s): POCBNP  CBG:  Recent Labs Lab 07/11/15 0711 07/11/15 1159 07/11/15 1348 07/11/15 1431 07/11/15 1635  GLUCAP 182* 196* 245* 226* 214*    Microbiology: Results for orders placed or performed during the hospital encounter of 06/27/15  Urine culture     Status: None   Collection Time: 06/27/15  9:04 AM  Result Value Ref Range Status   Specimen Description URINE, CLEAN CATCH  Final   Special Requests NONE  Final   Culture NO GROWTH 1 DAY  Final   Report Status 06/28/2015 FINAL  Final    Coagulation Studies: No results for input(s): LABPROT, INR in the last 72 hours.  Urinalysis: No results for input(s): COLORURINE, LABSPEC, PHURINE, GLUCOSEU, HGBUR, BILIRUBINUR, KETONESUR, PROTEINUR, UROBILINOGEN, NITRITE, LEUKOCYTESUR in the last 72 hours.  Invalid input(s): APPERANCEUR    Imaging: No results found.   Medications:     . calcium acetate  2,001 mg Oral TID WC  . carvedilol  12.5 mg Oral BID WC  . ceFAZolin      . docusate sodium  100 mg Oral BID  . fentaNYL      .  heparin subcutaneous  5,000 Units Subcutaneous 3 times per day  . insulin aspart  0-15 Units Subcutaneous TID WC  . insulin glargine  80 Units Subcutaneous Q24H   acetaminophen, diphenhydrAMINE **OR** diphenhydrAMINE, HYDROmorphone (DILAUDID) injection, ondansetron, oxyCODONE-acetaminophen  Assessment/ Plan:  50 y.o. male with ESRD on HD in [place] followed by [facility], HTN, AOCKD, DM-2 with complications, Hypothyroidism, Hyperuricemia, right foot osteomyelitis, left renal mass s/p left radical nephrectomy 07/11/15.  1. ESRD on HD MWF:  Patient had hemodialysis yesterday, no acute indication for HD today, will plan for HD again tomorrow per usual schedule.  UF  target 1.5kg.   2.  Anemia of CKD:  hgb currently 11.9.  No need for epogen at the moment.   3.  SHPTH: Continue phoslo 3 tabs po tid/wm, check phos with HD tomorrow.   4.  Left renal mass:  4cm renal mass noted, s/p hand assisted laparascopic left radical nephrectomy 07/11/15 by Dr. Erlene Quan.  Awaiting final pathology.  Otherwise plan per Dr. Erlene Quan.    LOS: 0 Torsha Lemus 4/11/20175:57 PM

## 2015-07-11 NOTE — Anesthesia Procedure Notes (Signed)
Procedure Name: Intubation Performed by: Demetrius Charity Pre-anesthesia Checklist: Patient identified, Patient being monitored, Timeout performed, Emergency Drugs available and Suction available Patient Re-evaluated:Patient Re-evaluated prior to inductionOxygen Delivery Method: Circle system utilized Preoxygenation: Pre-oxygenation with 100% oxygen Intubation Type: IV induction Ventilation: Mask ventilation with difficulty, Two handed mask ventilation required, Oral airway inserted - appropriate to patient size and Nasal airway inserted- appropriate to patient size Laryngoscope Size: Glidescope and 4 Grade View: Grade III Tube type: Oral Tube size: 7.0 mm Number of attempts: 3 Airway Equipment and Method: Stylet,  Bougie stylet and Video-laryngoscopy Placement Confirmation: ETT inserted through vocal cords under direct vision,  positive ETCO2 and breath sounds checked- equal and bilateral Secured at: 24 cm Tube secured with: Tape Dental Injury: Teeth and Oropharynx as per pre-operative assessment  Difficulty Due To: Difficulty was unanticipated, Difficult Airway- due to reduced neck mobility and Difficult Airway- due to large tongue Future Recommendations: Recommend- induction with short-acting agent, and alternative techniques readily available

## 2015-07-11 NOTE — Transfer of Care (Signed)
Immediate Anesthesia Transfer of Care Note  Patient: Tim Becker  Procedure(s) Performed: Procedure(s): HAND ASSISTED LAPAROSCOPIC NEPHRECTOMY (Left)  Patient Location: PACU  Anesthesia Type:General  Level of Consciousness: awake, alert  and oriented  Airway & Oxygen Therapy: Patient Spontanous Breathing and Patient connected to face mask oxygen  Post-op Assessment: Report given to RN and Post -op Vital signs reviewed and stable  Post vital signs: Reviewed and stable  Last Vitals:  Filed Vitals:   07/11/15 0758 07/11/15 1339  BP: 145/66 175/82  Pulse: 68 92  Temp: 36.7 C 36.7 C  Resp: 16 20    Complications: No apparent anesthesia complications

## 2015-07-11 NOTE — H&P (View-Only) (Signed)
3:35 PM  06/22/2015  Tim Becker 1966/03/29 [id number]  Referring provider: No referring provider defined for this encounter.  Chief Complaint  Patient presents with  . Results    70month with CTscan results    HPI:  50 year old male with end-stage renal disease now on hemodialysis, diabetes who presents today for follow-up of his known left renal mass.  Previously, this mass is unable to be fully characterized due to his inability to receive IV contrast and habitus.  Most recent imaging at [facility] reveals a 3.3 cm left mid pole exophytic renal mass on 08/08/2014. He returns today to discuss follow-up CT abdomen with and without contrast which shows the mass is increased to 4 cm and does indeed enhance highly suspicious for RCC.  Now on HD, M-W-Fr at [facility] in [place] (Donato Heinz, MD).  He has had issues with right foot and rigth AKA with several admissions over the past year for this.  He has been actively trying to lose weight.    He is still hopeful that he can a candidate for renal transplantation.   PMH: Past Medical History  Diagnosis Date  . ESRD (end stage renal disease) ([place])     Pre- dialysis  . DM (diabetes mellitus) with complications ([employer])   . Coronary artery disease   . Thyroid disease   . Osteomyelitis, chronic, ankle or foot ([place])   . Hypertension   . CKD (chronic kidney disease)   . Left kidney mass   . Diabetes ([place])   . Sleep apnea   . Anemia   . Renal insufficiency     Patient is on Dialysis and receives M,W and F.    Surgical History: Past Surgical History  Procedure Laterality Date  . Coronary angioplasty with stent placement    . Joint replacement    . Foot surgery      Multiple R foot surgery for infection and charcot foot  . Peripheral vascular catheterization Left 12/06/2014    Procedure: A/V Shuntogram/Fistulagram;  Surgeon: Katha Cabal, MD;  Location: [place] [facility];  Service: Cardiovascular;   Laterality: Left;  . Peripheral vascular catheterization Left 12/06/2014    Procedure: A/V Shunt Intervention;  Surgeon: Katha Cabal, MD;  Location: [place] [facility];  Service: Cardiovascular;  Laterality: Left;  . Below knee leg amputation Right   . Bascilic vein transposition Left 02/17/2015    Procedure: BASCILIC VEIN TRANSPOSITION;  Surgeon: Katha Cabal, MD;  Location: [facility] ORS;  Service: Vascular;  Laterality: Left;    Home Medications:    Medication List       This list is accurate as of: 06/22/15  3:35 PM.  Always use your most recent med list.               calcium acetate 667 MG capsule  Commonly known as:  PHOSLO     carvedilol 12.5 MG tablet  Commonly known as:  COREG  Take 12.5 mg by mouth 2 (two) times daily with a meal.     LANTUS SOLOSTAR 100 UNIT/ML Solostar Pen  Generic drug:  Insulin Glargine  Inject 80 Units into the skin daily.        Allergies: No Known Allergies  Family History: Family History  Problem Relation Age of Onset  . Diabetes Mother   . Kidney disease Mother   . Breast cancer Mother     Social History:  reports that he has never smoked. He does not  have any smokeless tobacco history on file. He reports that he does not drink alcohol or use illicit drugs.  ROS: UROLOGY Frequent Urination?: No Hard to postpone urination?: No Burning/pain with urination?: No Get up at night to urinate?: No Leakage of urine?: No Urine stream starts and stops?: No Trouble starting stream?: No Do you have to strain to urinate?: No Blood in urine?: No Urinary tract infection?: No Sexually transmitted disease?: No Injury to kidneys or bladder?: No Painful intercourse?: No Weak stream?: No Erection problems?: No Penile pain?: No  Gastrointestinal Nausea?: No Vomiting?: No Indigestion/heartburn?: No Diarrhea?: No Constipation?: Yes  Constitutional Fever: No Night sweats?: No Weight loss?: No Fatigue?: No  Skin Skin  rash/lesions?: No Itching?: Yes  Eyes Blurred vision?: Yes Double vision?: No  Ears/Nose/Throat Sore throat?: No Sinus problems?: Yes  Hematologic/Lymphatic Swollen glands?: No Easy bruising?: No  Cardiovascular Leg swelling?: No Chest pain?: No  Respiratory Cough?: Yes Shortness of breath?: No  Endocrine Excessive thirst?: No  Musculoskeletal Back pain?: No Joint pain?: No  Neurological Headaches?: No Dizziness?: No  Psychologic Depression?: No Anxiety?: No  Physical Exam: BP 159/75 mmHg  Pulse 83  Ht 5\' 7"  (1.702 m)  Wt 240 lb (108.863 kg)  BMI 37.58 kg/m2  Constitutional:  Alert and oriented, No acute distress. HEENT: [place] AT, moist mucus membranes.  Trachea midline, no masses. Cardiovascular: No clubbing, cyanosis, or edema.  RRR. Respiratory: Normal respiratory effort, no increased work of breathing.  CTAB. GI: Abdomen is soft, nontender, nondistended, no abdominal masses GU: No CVA tenderness. Obese. No abdominal scars noted. Skin: No rashes, bruises or suspicious lesions. Neurologic: Grossly intact, no focal deficits, moving all extremities. Psychiatric: Normal mood and affect.  Laboratory Data: Lab Results  Component Value Date   WBC 9.9 02/07/2015   HGB 12.2* 02/17/2015   HCT 36.0* 02/17/2015   MCV 85.0 02/07/2015   PLT 199 02/07/2015    Lab Results  Component Value Date   CREATININE 6.02* 02/07/2015   Pertinent Imaging: Study Result     CLINICAL DATA: Followup renal mass  EXAM: CT ABDOMEN WITHOUT AND WITH CONTRAST  TECHNIQUE: Multidetector CT imaging of the abdomen was performed following the standard protocol before and following the bolus administration of intravenous contrast.  CONTRAST: 156mL OMNIPAQUE IOHEXOL 350 MG/ML SOLN  COMPARISON: 07/21/2014.  FINDINGS: Lower chest: There is no pleural fluid identified. The lung bases are clear.  Hepatobiliary: There is no suspicious liver abnormality  identified. Stones are noted within the dependent portion of the gallbladder. No gallbladder wall thickening. No biliary dilatation.  Pancreas: Normal appearance of the pancreas.  Spleen: The spleen appears normal.  Adrenals/Urinary Tract: Normal appearance of the adrenal glands. Solid enhancing mass arising from the lateral cortex of the left kidney measures 4 x 3.8 x 3.9 cm, image 56 of series 5. On the previous exam this measured 3.3 x 3.1 x 3.2 cm. No right kidney lesion identified. The renal veins appear patent bilaterally.  Stomach/Bowel: The stomach is normal. The small bowel loops have a normal course and caliber. The appendix is visualized and appears normal. No pathologic dilatation of the large bowel loops.  Vascular/Lymphatic: Calcified atherosclerotic disease involves the abdominal aorta. No aneurysm. Borderline periaortic lymph node posterior to the left renal artery measures 9 mm, image number 50 of series 5. Unchanged from previous exam. The other index periaortic lymph node measures 8 mm, image 64 of series 5. Also stable.  Other: No free fluid or fluid collections identified within  the upper abdomen.  Musculoskeletal: No aggressive lytic or sclerotic bone lesions.  IMPRESSION: 1. There has been interval increase in size of the solid enhancing mass arising from the a left kidney which is worrisome for a renal cell carcinoma. 2. Stable sub cm periaortic lymph nodes 3. Gallstones 4. Aortic atherosclerosis.   Electronically Signed  By: Kerby Moors M.D.  On: 06/01/2015 11:23   Imaging personally reviewed today and with the patient on his daughter via face time.   Assessment & Plan:    1. Renal mass Left exophytic renal mass, now increasing in size up to 3.9 cm. This growth rate is highly concerning for malignancy. Previously, were never able to fully characterize the mass due to his inability to receive IV contrast.  Recent follow-up CT scan  shows a 4 cm left partially exophytic left lateral mid pole and single renal mass highly suspicious for RCC. No evidence of renal vein invasion. No evidence of obvious metastatic disease although there is a borderline 9 mm paraaortic lymph node posterior to the left renal vein stable x 1 year. We'll assess intraoperatively.  I have recommended left hand assisted laparoscopic radical nephrectomy. There is no indication for nephron sparing surgery given his history of end-stage renal disease currently on hemodialysis.  Preoperative, intraoperative and postoperative course were discussed.  Risks of surgery were reviewed in detail today. These include risk of bleeding, infection, damage to surrounding structures, along with other major complications such as heart attack, stroke, and even death. Patient understands this and is agreeable to proceed.  Patient does have a known history of EKG abnormality and previously underwent cardiac clearance for this. He will need to be reassessed by our anesthesia colleagues prior to surgery. He is relatively high risk given his history of severely uncontrolled diabetes, CAD, end-stage renal disease, morbid obesity, and OSA.  2. ESRD on dialysis [facility]) Surgery will need to be on a Tuesday to maintain his Monday/[place]/Friday hemodialysis schedule. We will inform local nephrologist of his admission and arrange for dialysis on postop day 1.  3. Morbid obesity, unspecified obesity type ([place]) Advised continued weight loss to improved surgical outcome   I spent 45 min with this patient of which greater than 50% was spent in counseling and coordination of care with the patient.    Hollice Espy, MD  [facility] [address], [place], [place] [address] [phone]

## 2015-07-12 LAB — CBC
HCT: 36.1 % — ABNORMAL LOW (ref 40.0–52.0)
Hemoglobin: 11.4 g/dL — ABNORMAL LOW (ref 13.0–18.0)
MCH: 28.2 pg (ref 26.0–34.0)
MCHC: 31.6 g/dL — ABNORMAL LOW (ref 32.0–36.0)
MCV: 89.3 fL (ref 80.0–100.0)
Platelets: 216 10*3/uL (ref 150–440)
RBC: 4.04 MIL/uL — ABNORMAL LOW (ref 4.40–5.90)
RDW: 18.6 % — ABNORMAL HIGH (ref 11.5–14.5)
WBC: 11.3 10*3/uL — ABNORMAL HIGH (ref 3.8–10.6)

## 2015-07-12 LAB — BASIC METABOLIC PANEL
Anion gap: 12 (ref 5–15)
BUN: 77 mg/dL — ABNORMAL HIGH (ref 6–20)
CO2: 24 mmol/L (ref 22–32)
Calcium: 8.2 mg/dL — ABNORMAL LOW (ref 8.9–10.3)
Chloride: 100 mmol/L — ABNORMAL LOW (ref 101–111)
Creatinine, Ser: 8.97 mg/dL — ABNORMAL HIGH (ref 0.61–1.24)
GFR calc Af Amer: 7 mL/min — ABNORMAL LOW (ref >60)
GFR calc non Af Amer: 6 mL/min — ABNORMAL LOW (ref >60)
Glucose, Bld: 228 mg/dL — ABNORMAL HIGH (ref 65–99)
Potassium: 6.2 mmol/L — ABNORMAL HIGH (ref 3.5–5.1)
Sodium: 136 mmol/L (ref 135–145)

## 2015-07-12 LAB — PHOSPHORUS: Phosphorus: 7.5 mg/dL — ABNORMAL HIGH (ref 2.5–4.6)

## 2015-07-12 LAB — GLUCOSE, CAPILLARY
Glucose-Capillary: 226 mg/dL — ABNORMAL HIGH (ref 65–99)
Glucose-Capillary: 245 mg/dL — ABNORMAL HIGH (ref 65–99)
Glucose-Capillary: 137 mg/dL — ABNORMAL HIGH (ref 65–99)
Glucose-Capillary: 156 mg/dL — ABNORMAL HIGH (ref 65–99)

## 2015-07-12 MED ORDER — SODIUM CHLORIDE 0.9 % IV SOLN: 100.0000 mL | INTRAVENOUS | Status: DC | PRN

## 2015-07-12 MED ORDER — LIDOCAINE-PRILOCAINE 2.5-2.5 % EX CREA
1.0000 "application " | TOPICAL_CREAM | CUTANEOUS | Status: DC | PRN
  Filled 2015-07-12: qty 5

## 2015-07-12 MED ORDER — HEPARIN SODIUM (PORCINE) 1000 UNIT/ML DIALYSIS
1000.0000 [IU] | INTRAMUSCULAR | Status: DC | PRN
  Filled 2015-07-12: qty 1

## 2015-07-12 MED ORDER — LIDOCAINE HCL (PF) 1 % IJ SOLN
5.0000 mL | INTRAMUSCULAR | Status: DC | PRN
  Filled 2015-07-12: qty 5

## 2015-07-12 MED ORDER — ALTEPLASE 2 MG IJ SOLR
2.0000 mg | Freq: Once | INTRAMUSCULAR | Status: DC | PRN
  Filled 2015-07-12: qty 2

## 2015-07-12 MED ORDER — PENTAFLUOROPROP-TETRAFLUOROETH EX AERO
1.0000 "application " | INHALATION_SPRAY | CUTANEOUS | Status: DC | PRN
  Filled 2015-07-12: qty 30

## 2015-07-12 NOTE — Progress Notes (Signed)
[facility]  ROUNDING NOTE   Subjective:  Patient due for hemodialysis today. Orders have been prepared. He continues to have nausea and poor appetite at the moment.   Objective:  Vital signs in last 24 hours:  Temp:  [97.7 F (36.5 C)-98.8 F (37.1 C)] 98.3 F (36.8 C) (04/12 0532) Pulse Rate:  [71-92] 87 (04/12 0532) Resp:  [12-20] 18 (04/12 0532) BP: (128-175)/(62-84) 156/74 mmHg (04/12 0532) SpO2:  [89 %-100 %] 94 % (04/12 0532) Arterial Line BP: (157-213)/(68-78) 164/71 mmHg (04/11 1424)  Weight change:  Filed Weights   07/11/15 0758  Weight: 109.77 kg (242 lb)    Intake/Output: I/O last 3 completed shifts: In: 700 [I.V.:700] Out: 211 [Urine:10; Emesis/NG output:1; Blood:200]   Intake/Output this shift:  Total I/O In: 120 [P.O.:120] Out: 0   Physical Exam: General: NAD, resting in bed  Head: Normocephalic, atraumatic. Moist oral mucosal membranes  Eyes: Anicteric  Neck: Supple, trachea midline  Lungs:  Clear to auscultation  Heart: Regular rate and rhythm  Abdomen:  Soft, nontender, surgical scar noted  Extremities: no peripheral edema.  Neurologic: Nonfocal, moving all four extremities  Skin: No lesions  Access: LUE AVF with good bruit and thrill    Basic Metabolic Panel:  Recent Labs Lab 07/11/15 0746 07/11/15 1349 07/12/15 0448  NA 141 134* 136  K 4.3 6.4* 6.2*  CL  --  100* 100*  CO2  --  26 24  GLUCOSE 176* 256* 228*  BUN  --  66* 77*  CREATININE  --  7.84* 8.97*  CALCIUM  --  7.8* 8.2*  PHOS  --   --  7.5*    Liver Function Tests: No results for input(s): AST, ALT, ALKPHOS, BILITOT, PROT, ALBUMIN in the last 168 hours. No results for input(s): LIPASE, AMYLASE in the last 168 hours. No results for input(s): AMMONIA in the last 168 hours.  CBC:  Recent Labs Lab 07/11/15 0746 07/11/15 1349 07/12/15 0448  WBC  --  14.8* 11.3*  HGB 13.9 11.9* 11.4*  HCT 41.0 37.2* 36.1*  MCV  --  88.8 89.3  PLT  --  220 216     Cardiac Enzymes: No results for input(s): CKTOTAL, CKMB, CKMBINDEX, TROPONINI in the last 168 hours.  BNP: Invalid input(s): POCBNP  CBG:  Recent Labs Lab 07/11/15 1431 07/11/15 1635 07/11/15 2108 07/12/15 0733 07/12/15 1127  GLUCAP 226* 214* 130* 226* 245*    Microbiology: Results for orders placed or performed during the hospital encounter of 06/27/15  Urine culture     Status: None   Collection Time: 06/27/15  9:04 AM  Result Value Ref Range Status   Specimen Description URINE, CLEAN CATCH  Final   Special Requests NONE  Final   Culture NO GROWTH 1 DAY  Final   Report Status 06/28/2015 FINAL  Final    Coagulation Studies: No results for input(s): LABPROT, INR in the last 72 hours.  Urinalysis: No results for input(s): COLORURINE, LABSPEC, PHURINE, GLUCOSEU, HGBUR, BILIRUBINUR, KETONESUR, PROTEINUR, UROBILINOGEN, NITRITE, LEUKOCYTESUR in the last 72 hours.  Invalid input(s): APPERANCEUR    Imaging: No results found.   Medications:     . calcium acetate  2,001 mg Oral TID WC  . carvedilol  12.5 mg Oral BID WC  . docusate sodium  100 mg Oral BID  . heparin subcutaneous  5,000 Units Subcutaneous 3 times per day  . insulin aspart  0-15 Units Subcutaneous TID WC  . insulin glargine  80 Units  Subcutaneous Q24H   acetaminophen, diphenhydrAMINE **OR** diphenhydrAMINE, HYDROmorphone (DILAUDID) injection, ondansetron, oxyCODONE-acetaminophen  Assessment/ Plan:  50 y.o. male with ESRD on HD in [place] followed by [facility], HTN, AOCKD, DM-2 with complications, Hypothyroidism, Hyperuricemia, right foot osteomyelitis, left renal mass s/p left radical nephrectomy 07/11/15.  1. ESRD on HD MWF:  Patient due for hemodialysis today.  Orders have an prepared.  We will continue patient on a Monday, Wednesday, Friday dialysis schedule.   2.  Anemia of CKD: hemoglobin 11.4.  Hold off on Epogen.  3.  SHPTH: phosphorus high at 7.5.  ContinuePhosLo 3 tablets by mouth 3  times a day with meals.  He'll need ongoing monitoring of his bone mineral metabolism as an outpatient.   4.  Left renal mass:  4cm renal mass noted, s/p hand assisted laparascopic left radical nephrectomy 07/11/15 by Dr. Erlene Quan.  Awaiting final pathology.  Patient with some nausea and poor appetite this a.m.  Disposition per Dr. Erlene Quan.   LOS: 1 Tim Becker 4/12/201711:28 AM

## 2015-07-12 NOTE — Progress Notes (Signed)
Inpatient Diabetes Program Recommendations  AACE/ADA: New Consensus Statement on Inpatient Glycemic Control (2015)  Target Ranges:  Prepandial:   less than 140 mg/dL      Peak postprandial:   less than 180 mg/dL (1-2 hours)      Critically ill patients:  140 - 180 mg/dL  Results for LANZ, MOR (MRN [id number]) as of 07/12/2015 09:35  Ref. Range 07/11/2015 07:11 07/11/2015 11:59 07/11/2015 13:48 07/11/2015 14:31 07/11/2015 16:35 07/11/2015 21:08 07/12/2015 07:33  Glucose-Capillary Latest Ref Range: 65-99 mg/dL 182 (H) 196 (H) 245 (H) 226 (H) 214 (H) 130 (H) 226 (H)   Review of Glycemic Control  Diabetes history: DM2 Outpatient Diabetes medications: Lantus 80 units daily, Humalog 6 units as needed for glucose > 150 mg/dl Current orders for Inpatient glycemic control: Lantus 80 units Q24H, Novolog 0-15 units TID with meals  Inpatient Diabetes Program Recommendations: Insulin - Basal: In reviewing the chart, noted patient refused Lantus last night. Therefore, patient has not received any basal insulin since being admitted. Fasting glucose is 226 mg/dl this morning. May want to consider decreasing Lantus while inpatient. Correction (SSI): Please consider ordering Novolog bedtime correction scale. HgbA1C: Please order an A1C to evaluate glycemic control over the past 2-3 months.  Thanks, Barnie Alderman, RN, MSN, CDE Diabetes Coordinator Inpatient Diabetes Program [phone] (Team Pager from [place] to [place]) [phone] ([facility] office) [phone] [facility] office) [phone] [facility] office)

## 2015-07-12 NOTE — Care Management (Signed)
Screened POD 1- lap radical right nephrectomy. Patient resides in [place] and receives hemodialysis at [facility] in [place].  He drives himself and if he is unable to drive during his post op course, he will not have issue with transportation.  Dialysis days m w f.  He has a prosthetic below knee device. Independent in all adls, denies issues accessing medical care, obtaining medications or with transportation.  Current with  PCP- in the [facility] group in [place].  No discharge needs identified at present by care manager or members of care team

## 2015-07-12 NOTE — Progress Notes (Signed)
1 Day Post-Op Subjective: The patient is doing well.  No nausea or vomiting. Ambulated to restroom.  Passing flatus but unable to have BM.  Nephrology consulted for HD today.    Objective: Vital signs in last 24 hours: Temp:  [97.7 F (36.5 C)-98.8 F (37.1 C)] 98.3 F (36.8 C) (04/12 0532) Pulse Rate:  [71-92] 87 (04/12 0532) Resp:  [12-20] 18 (04/12 0532) BP: (128-175)/(62-84) 156/74 mmHg (04/12 0532) SpO2:  [89 %-100 %] 94 % (04/12 0532) Arterial Line BP: (157-213)/(68-78) 164/71 mmHg (04/11 1424)  Intake/Output from previous day: 04/11 0701 - 04/12 0700 In: 700 [I.V.:700] Out: 211 [Urine:10; Emesis/NG output:1; Blood:200] Intake/Output this shift:    Physical Exam:  General: Alert and oriented. CV: RRR Lungs: Clear bilaterally. GI: Soft, Nondistended.  Obese.   Incisions: Clean and dry. Extremities: Nontender, no erythema, no edema.  Lab Results:  Recent Labs  07/11/15 0746 07/11/15 1349 07/12/15 0448  HGB 13.9 11.9* 11.4*  HCT 41.0 37.2* 36.1*          Recent Labs  07/11/15 1349 07/12/15 0448  CREATININE 7.84* 8.97*           Results for orders placed or performed during the hospital encounter of 07/11/15 (from the past 24 hour(s))  Glucose, capillary     Status: Abnormal   Collection Time: 07/11/15 11:59 AM  Result Value Ref Range   Glucose-Capillary 196 (H) 65 - 99 mg/dL  Glucose, capillary     Status: Abnormal   Collection Time: 07/11/15  1:48 PM  Result Value Ref Range   Glucose-Capillary 245 (H) 65 - 99 mg/dL  CBC     Status: Abnormal   Collection Time: 07/11/15  1:49 PM  Result Value Ref Range   WBC 14.8 (H) 3.8 - 10.6 K/uL   RBC 4.19 (L) 4.40 - 5.90 MIL/uL   Hemoglobin 11.9 (L) 13.0 - 18.0 g/dL   HCT 37.2 (L) 40.0 - 52.0 %   MCV 88.8 80.0 - 100.0 fL   MCH 28.5 26.0 - 34.0 pg   MCHC 32.1 32.0 - 36.0 g/dL   RDW 18.2 (H) 11.5 - 14.5 %   Platelets 220 150 - 440 K/uL  Basic metabolic panel     Status: Abnormal   Collection Time: 07/11/15   1:49 PM  Result Value Ref Range   Sodium 134 (L) 135 - 145 mmol/L   Potassium 6.4 (H) 3.5 - 5.1 mmol/L   Chloride 100 (L) 101 - 111 mmol/L   CO2 26 22 - 32 mmol/L   Glucose, Bld 256 (H) 65 - 99 mg/dL   BUN 66 (H) 6 - 20 mg/dL   Creatinine, Ser 7.84 (H) 0.61 - 1.24 mg/dL   Calcium 7.8 (L) 8.9 - 10.3 mg/dL   GFR calc non Af Amer 7 (L) >60 mL/min   GFR calc Af Amer 8 (L) >60 mL/min   Anion gap 8 5 - 15  Glucose, capillary     Status: Abnormal   Collection Time: 07/11/15  2:31 PM  Result Value Ref Range   Glucose-Capillary 226 (H) 65 - 99 mg/dL  Glucose, capillary     Status: Abnormal   Collection Time: 07/11/15  4:35 PM  Result Value Ref Range   Glucose-Capillary 214 (H) 65 - 99 mg/dL   Comment 1 Notify RN   Glucose, capillary     Status: Abnormal   Collection Time: 07/11/15  9:08 PM  Result Value Ref Range   Glucose-Capillary 130 (H) 65 - 99  mg/dL  CBC     Status: Abnormal   Collection Time: 07/12/15  4:48 AM  Result Value Ref Range   WBC 11.3 (H) 3.8 - 10.6 K/uL   RBC 4.04 (L) 4.40 - 5.90 MIL/uL   Hemoglobin 11.4 (L) 13.0 - 18.0 g/dL   HCT 36.1 (L) 40.0 - 52.0 %   MCV 89.3 80.0 - 100.0 fL   MCH 28.2 26.0 - 34.0 pg   MCHC 31.6 (L) 32.0 - 36.0 g/dL   RDW 18.6 (H) 11.5 - 14.5 %   Platelets 216 150 - 440 K/uL  Basic metabolic panel     Status: Abnormal   Collection Time: 07/12/15  4:48 AM  Result Value Ref Range   Sodium 136 135 - 145 mmol/L   Potassium 6.2 (H) 3.5 - 5.1 mmol/L   Chloride 100 (L) 101 - 111 mmol/L   CO2 24 22 - 32 mmol/L   Glucose, Bld 228 (H) 65 - 99 mg/dL   BUN 77 (H) 6 - 20 mg/dL   Creatinine, Ser 8.97 (H) 0.61 - 1.24 mg/dL   Calcium 8.2 (L) 8.9 - 10.3 mg/dL   GFR calc non Af Amer 6 (L) >60 mL/min   GFR calc Af Amer 7 (L) >60 mL/min   Anion gap 12 5 - 15  Glucose, capillary     Status: Abnormal   Collection Time: 07/12/15  7:33 AM  Result Value Ref Range   Glucose-Capillary 226 (H) 65 - 99 mg/dL    Assessment/Plan: POD# 1 s/p left hand assisted  laparoscopic nephrectomy for 4 cm left renal mass.  1) Ambulate, Incentive spirometry 2) Advance diet as tolerated 3) Transition to oral pain medication 4) HD today for nephrology 5) Possible discharge later today   Hollice Espy, MD   LOS: 1 day   Hollice Espy 07/12/2015, 9:55 AM

## 2015-07-13 LAB — GLUCOSE, CAPILLARY
Glucose-Capillary: 225 mg/dL — ABNORMAL HIGH (ref 65–99)
Glucose-Capillary: 243 mg/dL — ABNORMAL HIGH (ref 65–99)
Glucose-Capillary: 171 mg/dL — ABNORMAL HIGH (ref 65–99)

## 2015-07-13 LAB — PARATHYROID HORMONE, INTACT (NO CA): PTH: 140 pg/mL — ABNORMAL HIGH (ref 15–65)

## 2015-07-13 NOTE — Care Management Important Message (Signed)
Important Message  Patient Details  Name: Tim Becker MRN: [id number] Date of Birth: 30-Apr-1965   Medicare Important Message Given:  Yes    Juliann Pulse A Viana Sleep 07/13/2015, 9:32 AM

## 2015-07-13 NOTE — Progress Notes (Signed)
Pt DC'ed from unit assisted by staff and accompanied by wife.

## 2015-07-13 NOTE — Discharge Summary (Signed)
Date of admission: 07/11/2015  Date of discharge: 07/13/2015  Admission diagnosis: Left renal mass, ESRD on HD  Discharge diagnosis: Same as above  Secondary diagnoses:  Patient Active Problem List   Diagnosis Date Noted  . Left renal mass 07/11/2015  . Biliary calculi 09/14/2014  . Hemodialysis-associated hypotension 09/13/2014  . H/O amputation of leg through tibia and fibula 09/08/2014  . Anemia associated with chronic renal failure 09/06/2014  . Type 2 diabetes mellitus with diabetic neuropathy (HCC) 08/07/2014  . Essential hypertension 08/07/2014  . Obesity 08/07/2014  . Hypothyroidism 08/07/2014  . Osteomyelitis of ankle or foot, right, acute (HCC) 08/07/2014  . External hemorrhoid 06/04/2014  . Kidney lump 04/06/2014  . Obstructive apnea 04/06/2014  . Diabetes mellitus (HCC) 03/23/2014  . Patient awaiting renal transplant 03/23/2014  . Asthma, moderate persistent 10/28/2013  . Anemia due to blood loss 09/14/2013  . Morbid obesity (HCC) 09/11/2013  . Morbid (severe) obesity due to excess calories (HCC) 09/11/2013  . Chronic kidney disease, stage IV (severe) (HCC) 09/10/2013  . Type 2 diabetes mellitus (HCC) 09/10/2013  . Diabetic foot infection (HCC) 09/10/2013  . Mild persistent asthma with acute exacerbation 09/10/2013  . Abnormal ECG 05/07/2013  . CAD in native artery 05/07/2013    History and Physical: For full details, please see admission history and physical. Briefly, Iyad Jagielski is a 50 y.o. year old patient with left renal mass admitted following left hand assisted radical nephrectomy.   Hospital Course: Patient tolerated the procedure well.  He was then transferred to the floor after an uneventful PACU stay.  His hospital course was uncomplicated.  Nephrology was consulted and he underwent routine HD on POD #1. On POD#2  he had met discharge criteria: was eating a regular diet, was up and ambulating independently,  pain was well controlled, was voiding without  a catheter, and was ready to for discharge.  Physical Exam  Constitutional: He is oriented to person, place, and time. He appears well-developed and well-nourished.  HENT:  Head: Normocephalic.  Eyes: Pupils are equal, round, and reactive to light.  Neck: Normal range of motion. Neck supple.  Cardiovascular: Normal rate.   Pulmonary/Chest: Effort normal and breath sounds normal.  Abdominal: Soft. Bowel sounds are normal. He exhibits no distension. There is no tenderness.  Wounds c/d/i  Musculoskeletal: Normal range of motion.  Right BKA  Neurological: He is alert and oriented to person, place, and time.  Skin: Skin is warm and dry. No rash noted.   Blood pressure 147/80, pulse 105, temperature 98.9 F (37.2 C), temperature source Oral, resp. rate 19, height 5' 7" (1.702 m), weight 250 lb 14.1 oz (113.8 kg), SpO2 96 %.  Laboratory values:   Recent Labs  07/11/15 0746 07/11/15 1349 07/12/15 0448  WBC  --  14.8* 11.3*  HGB 13.9 11.9* 11.4*  HCT 41.0 37.2* 36.1*    Recent Labs  07/11/15 0746 07/11/15 1349 07/12/15 0448  NA 141 134* 136  K 4.3 6.4* 6.2*  CL  --  100* 100*  CO2  --  26 24  GLUCOSE 176* 256* 228*  BUN  --  66* 77*  CREATININE  --  7.84* 8.97*  CALCIUM  --  7.8* 8.2*   No results for input(s): LABPT, INR in the last 72 hours. No results for input(s): LABURIN in the last 72 hours. Results for orders placed or performed during the hospital encounter of 06/27/15  Urine culture     Status: None   Collection   Time: 06/27/15  9:04 AM  Result Value Ref Range Status   Specimen Description URINE, CLEAN CATCH  Final   Special Requests NONE  Final   Culture NO GROWTH 1 DAY  Final   Report Status 06/28/2015 FINAL  Final    Disposition: Home  Discharge instruction: The patient was instructed to be ambulatory but told to refrain from heavy lifting, strenuous activity, or driving.   Activity:  You are encouraged to ambulate frequently (about every hour during  waking hours) to help prevent blood clots from forming in your legs or lungs.  However, you should not engage in any heavy lifting (> 5-10 lbs), strenuous activity, or straining.   Diet: You should advance your diet as instructed by your physician.  It will be normal to have some bloating, nausea, and abdominal discomfort intermittently.   Prescriptions:  You will be provided a prescription for pain medication to take as needed.  If your pain is not severe enough to require the prescription pain medication, you may take extra strength Tylenol instead which will have less side effects.  You should also take a prescribed stool softener to avoid straining with bowel movements as the prescription pain medication may constipate you.   Incisions: You may remove your dressing bandages 48 hours after surgery if not removed in the hospital.  You will either have some small staples or special tissue glue at each of the incision sites. Once the bandages are removed (if present), the incisions may stay open to air.  You may start showering (but not soaking or bathing in water) the 2nd day after surgery and the incisions simply need to be patted dry after the shower.  No additional care is needed.  What to call us about: You should call the office if you develop fever > 101 or develop persistent vomiting, redness or draining around your incision, or any other concerning symptoms.    [facility] [address], [address] [place], [place] [address] [phone]    Discharge medications:    Medication List    ASK your doctor about these medications        calcium acetate 667 MG capsule  Commonly known as:  PHOSLO  Take 2,001 mg by mouth 3 (three) times daily with meals. 667 mg with snacks     carvedilol 12.5 MG tablet  Commonly known as:  COREG  Take 12.5 mg by mouth 2 (two) times daily with a meal.     LANTUS SOLOSTAR 100 UNIT/ML Solostar Pen  Generic drug:  Insulin  Glargine  Inject 80 Units into the skin daily.        Followup:  Follow-up Information    Follow up with Ashley Brandon, MD In 4 weeks.   Specialty:  Urology   Contact information:   [address] [place] [address] [phone]        

## 2015-07-13 NOTE — Discharge Instructions (Signed)
·   Activity:  You are encouraged to ambulate frequently (about every hour during waking hours) to help prevent blood clots from forming in your legs or lungs.  However, you should not engage in any heavy lifting (> 5-10 lbs), strenuous activity, or straining. ° °· Diet: You should advance your diet as instructed by your physician.  It will be normal to have some bloating, nausea, and abdominal discomfort intermittently. ° °· Prescriptions:  You will be provided a prescription for pain medication to take as needed.  If your pain is not severe enough to require the prescription pain medication, you may take extra strength Tylenol instead which will have less side effects.  You should also take a prescribed stool softener to avoid straining with bowel movements as the prescription pain medication may constipate you. ° °· Incisions: You may remove your dressing bandages 48 hours after surgery if not removed in the hospital.  You will either have some small staples or special tissue glue at each of the incision sites. Once the bandages are removed (if present), the incisions may stay open to air.  You may start showering (but not soaking or bathing in water) the 2nd day after surgery and the incisions simply need to be patted dry after the shower.  No additional care is needed. ° °What to call us about: You should call the office if you develop fever > 101 or develop persistent vomiting, redness or draining around your incision, or any other concerning symptoms.   ° °[facility] °[address], [address] °[place], [place] [address] °[phone] ° ° °

## 2015-07-14 LAB — SURGICAL PATHOLOGY

## 2015-08-10 ENCOUNTER — Encounter: Payer: Self-pay | Admitting: Urology

## 2015-08-10 ENCOUNTER — Ambulatory Visit (INDEPENDENT_AMBULATORY_CARE_PROVIDER_SITE_OTHER): Payer: Medicare Other | Admitting: Urology

## 2015-08-10 VITALS — BP 128/68 | HR 81 | Ht 68.0 in | Wt 241.6 lb

## 2015-08-10 DIAGNOSIS — C642 Malignant neoplasm of left kidney, except renal pelvis: Secondary | ICD-10-CM

## 2015-08-10 NOTE — Progress Notes (Signed)
08/10/2015 5:41 PM   Tim Becker [birth year] [id number]  Referring provider: No referring provider defined for this encounter.  Chief Complaint  Patient presents with  . Routine Post Op    HPI: 50 year old male on hemodialysis for end-stage renal disease found to have a 4 cm enhancing left renal mass now s/p left hand-assisted radical nephrectomy on 07/11/15.  Surgery was uncomplicated and he was discharged home in stable condition. He returns today for his routine postop visit.  Pathology was consistent with papillary renal cell carcinoma (type I) measuring 4 cm, pT1a N0 M0 With significant incidental glomerulosclerosis.  Today, he has no complaints. He had some soreness for a few weeks after the procedure but this has since resolved. He is anxious to return to regular activity. He's been on his normal dialysis schedule. Wounds are healing well without drainage or redness.  PMH: Past Medical History  Diagnosis Date  . ESRD (end stage renal disease) ([place])     Pre- dialysis  . DM (diabetes mellitus) with complications ([place])   . Coronary artery disease   . Thyroid disease   . Osteomyelitis, chronic, ankle or foot ([place])   . Hypertension   . CKD (chronic kidney disease)   . Left kidney mass   . Diabetes ([place])   . Anemia   . Renal insufficiency     Patient is on Dialysis and receives M,W and F.  . Sleep apnea     CPAP  . Arthritis   . Cancer [facility])     Renal Tumor  . Neuropathy ([place])   . S/P BKA (below knee amputation) [facility]) June 2016    Right , [place], [place]  . PONV (postoperative nausea and vomiting)     Surgical History: Past Surgical History  Procedure Laterality Date  . Coronary angioplasty with stent placement    . Joint replacement    . Foot surgery      Multiple R foot surgery for infection and charcot foot  . Peripheral vascular catheterization Left 12/06/2014    Procedure: A/V Shuntogram/Fistulagram;  Surgeon: Katha Cabal, MD;  Location: [facility];  Service: Cardiovascular;  Laterality: Left;  . Peripheral vascular catheterization Left 12/06/2014    Procedure: A/V Shunt Intervention;  Surgeon: Katha Cabal, MD;  Location: [facility];  Service: Cardiovascular;  Laterality: Left;  . Below knee leg amputation Right   . Bascilic vein transposition Left 02/17/2015    Procedure: BASCILIC VEIN TRANSPOSITION;  Surgeon: Katha Cabal, MD;  Location: [facility];  Service: Vascular;  Laterality: Left;  . Tonsillectomy    . Eye surgery    . Laparoscopic nephrectomy, hand assisted Left 07/11/2015    Procedure: HAND ASSISTED LAPAROSCOPIC NEPHRECTOMY;  Surgeon: Hollice Espy, MD;  Location: [facility];  Service: Urology;  Laterality: Left;    Home Medications:    Medication List       This list is accurate as of: 08/10/15  5:41 PM.  Always use your most recent med list.               calcium acetate 667 MG capsule  Commonly known as:  PHOSLO  Take 2,001 mg by mouth 3 (three) times daily with meals. 667 mg with snacks     carvedilol 12.5 MG tablet  Commonly known as:  COREG  Take 12.5 mg by mouth 2 (two) times daily with a meal.     insulin lispro 100 UNIT/ML injection  Commonly known as:  HUMALOG  Inject into the skin 3 (three) times daily before meals.     LANTUS SOLOSTAR 100 UNIT/ML Solostar Pen  Generic drug:  Insulin Glargine  Inject 80 Units into the skin daily.        Allergies: No Known Allergies  Family History: Family History  Problem Relation Age of Onset  . Diabetes Mother   . Kidney disease Mother   . Breast cancer Mother     Social History:  reports that he has never smoked. He has never used smokeless tobacco. He reports that he does not drink alcohol or use illicit drugs.  ROS: UROLOGY Frequent Urination?: No Hard to postpone urination?: No Burning/pain with urination?: No Get up at night to urinate?: No Leakage of urine?: No Urine stream starts and stops?: No Trouble  starting stream?: No Do you have to strain to urinate?: No Blood in urine?: No Urinary tract infection?: No Sexually transmitted disease?: No Injury to kidneys or bladder?: No Painful intercourse?: No Weak stream?: No Erection problems?: No Penile pain?: No  Gastrointestinal Nausea?: No Vomiting?: No Indigestion/heartburn?: No Diarrhea?: No Constipation?: Yes  Constitutional Fever: No Night sweats?: No Weight loss?: No Fatigue?: No  Skin Skin rash/lesions?: No Itching?: Yes  Eyes Blurred vision?: Yes Double vision?: No  Ears/Nose/Throat Sore throat?: No Sinus problems?: Yes  Hematologic/Lymphatic Swollen glands?: No Easy bruising?: No  Cardiovascular Leg swelling?: No Chest pain?: No  Respiratory Cough?: No Shortness of breath?: No  Endocrine Excessive thirst?: No  Musculoskeletal Back pain?: No Joint pain?: No  Neurological Headaches?: No Dizziness?: No  Psychologic Depression?: No Anxiety?: No  Physical Exam: BP 128/68 mmHg  Pulse 81  Ht 5\' 8"  (1.727 m)  Wt 241 lb 9.6 oz (109.589 kg)  BMI 36.74 kg/m2  Constitutional:  Alert and oriented, No acute distress. HEENT: [place] AT, moist mucus membranes.  Trachea midline, no masses. Cardiovascular: No clubbing, cyanosis, or edema. Respiratory: Normal respiratory effort, no increased work of breathing. GI: Abdomen is soft, nontender, nondistended, no abdominal masses.  Abdominal incisions healing well. Midline inferior incision with overlying scab. GU: No CVA tenderness.  Skin: No rashes, bruises or suspicious lesions. Neurologic: Grossly intact, no focal deficits, moving all 4 extremities. Psychiatric: Normal mood and affect.  Laboratory Data: Lab Results  Component Value Date   WBC 11.3* 07/12/2015   HGB 11.4* 07/12/2015   HCT 36.1* 07/12/2015   MCV 89.3 07/12/2015   PLT 216 07/12/2015    Lab Results  Component Value Date   CREATININE 8.97* 07/12/2015    Assessment & Plan:   1.  Renal cell carcinoma, left (HCC) Status post uncomplicated laparoscopic left radical nephrectomy on 07/2015 Pathology consistent with type I papillary RCC, pT1aN0M) Plan for follow up with CT abdomen with and without contrast in 6 months, will dramatically increase interval of imaging thereafter - CT Abd Wo & W Cm; Future  2.  ESRD on HD   Return in about 6 months (around 02/10/2016) for CT abd with and without contrast (day prior to HD).  Hollice Espy, MD  [facility] [address] [place], [place] [address] [phone]

## 2015-12-15 ENCOUNTER — Encounter (INDEPENDENT_AMBULATORY_CARE_PROVIDER_SITE_OTHER): Payer: Self-pay

## 2016-01-10 ENCOUNTER — Other Ambulatory Visit (INDEPENDENT_AMBULATORY_CARE_PROVIDER_SITE_OTHER): Payer: Self-pay | Admitting: Vascular Surgery

## 2016-01-10 ENCOUNTER — Encounter: Payer: Self-pay | Admitting: Podiatry

## 2016-01-10 ENCOUNTER — Ambulatory Visit (INDEPENDENT_AMBULATORY_CARE_PROVIDER_SITE_OTHER): Payer: Medicare Other | Admitting: Podiatry

## 2016-01-10 ENCOUNTER — Ambulatory Visit (INDEPENDENT_AMBULATORY_CARE_PROVIDER_SITE_OTHER): Payer: Medicare Other

## 2016-01-10 VITALS — BP 161/92 | HR 76 | Temp 99.8°F | Resp 16 | Ht 68.0 in | Wt 250.0 lb

## 2016-01-10 DIAGNOSIS — I70245 Atherosclerosis of native arteries of left leg with ulceration of other part of foot: Secondary | ICD-10-CM

## 2016-01-10 DIAGNOSIS — L97522 Non-pressure chronic ulcer of other part of left foot with fat layer exposed: Secondary | ICD-10-CM

## 2016-01-10 DIAGNOSIS — Z992 Dependence on renal dialysis: Secondary | ICD-10-CM

## 2016-01-10 DIAGNOSIS — E11621 Type 2 diabetes mellitus with foot ulcer: Secondary | ICD-10-CM

## 2016-01-10 DIAGNOSIS — L97509 Non-pressure chronic ulcer of other part of unspecified foot with unspecified severity: Secondary | ICD-10-CM

## 2016-01-10 DIAGNOSIS — E1143 Type 2 diabetes mellitus with diabetic autonomic (poly)neuropathy: Secondary | ICD-10-CM

## 2016-01-10 DIAGNOSIS — M79672 Pain in left foot: Secondary | ICD-10-CM | POA: Diagnosis not present

## 2016-01-10 DIAGNOSIS — L97529 Non-pressure chronic ulcer of other part of left foot with unspecified severity: Secondary | ICD-10-CM | POA: Diagnosis not present

## 2016-01-10 DIAGNOSIS — T829XXA Unspecified complication of cardiac and vascular prosthetic device, implant and graft, initial encounter: Secondary | ICD-10-CM

## 2016-01-10 DIAGNOSIS — E08621 Diabetes mellitus due to underlying condition with foot ulcer: Secondary | ICD-10-CM

## 2016-01-10 DIAGNOSIS — N186 End stage renal disease: Secondary | ICD-10-CM

## 2016-01-10 MED ORDER — SULFAMETHOXAZOLE-TRIMETHOPRIM 800-160 MG PO TABS: 1.0000 | ORAL_TABLET | Freq: Two times a day (BID) | ORAL | 0 refills | Status: DC

## 2016-01-10 NOTE — Progress Notes (Signed)
   Subjective:    Patient ID: Tim Becker, male    DOB: Jul 23, 1965, 50 y.o.   MRN: [id number]  HPI    Review of Systems  Constitutional: Positive for chills and fatigue.  Eyes: Positive for itching and visual disturbance.  Respiratory: Positive for cough.   Gastrointestinal: Positive for constipation.  Endocrine: Positive for polydipsia.  Skin: Positive for wound.  Neurological: Positive for numbness.  All other systems reviewed and are negative.      Objective:   Physical Exam        Assessment & Plan:

## 2016-01-11 ENCOUNTER — Telehealth: Payer: Self-pay | Admitting: *Deleted

## 2016-01-11 ENCOUNTER — Ambulatory Visit (INDEPENDENT_AMBULATORY_CARE_PROVIDER_SITE_OTHER): Payer: Self-pay | Admitting: Vascular Surgery

## 2016-01-11 ENCOUNTER — Encounter (INDEPENDENT_AMBULATORY_CARE_PROVIDER_SITE_OTHER): Payer: Medicare Other

## 2016-01-11 NOTE — Telephone Encounter (Addendum)
-----   Message from Edrick Kins, DPM sent at 01/10/2016  3:03 PM EDT ----- Regarding: Wound Care Supplies Wound care supplies :  (changes will be done by patient at home every other day x 6 weeks)  1. Prisma collagen dressing 2. Aquacel Ag 3. 3" x 3" gauze  Dx : diabetes mellitus w/ neuropathy Diabetic foot ulcer left great toe 2.5 x 2.0 x 0.3cm (LxWxD) 01/11/2016-Orders faxed to [facility].

## 2016-01-11 NOTE — Progress Notes (Signed)
Patient ID: Tim Becker, male   DOB: [birth year], 50 y.o.   MRN: [id number] Subjective:  50 year old male with a history of diabetes mellitus and currently on dialysis presents to the office today for evaluation of a left great toe ulcer. Patient states that he's noticed the ulcer for approximately 2 weeks now. Patient has been diagnosed with diabetes mellitus since 1997 and underwent a below-knee amputation to the right foot secondary to diabetes mellitus last year.    Objective/Physical Exam General: The patient is alert and oriented x3 in no acute distress.  Dermatology: Ulcer noted to the plantar aspect of the left great toe measuring 2.5 cm in length by 2.0 cm in width by 0.3 cm in depth.  To the above-noted ulceration there is no eschar there is extensive amount of slough fibrin and necrotic tissue granulation tissue and wound base is red with extensive yellow fibrotic tissue. There is a moderate amount of serosanguineous drainage noted. Mild malodor. There is exposed tendon, there is no exposed bone ligament or joint. Periwound integrity is callused.  Vascular: Palpable pedal pulses bilaterally. No edema or erythema noted. Capillary refill within normal limits.  Neurological: Epicritic and protective threshold absent bilaterally.   Musculoskeletal Exam: Range of motion within normal limits to all pedal and ankle joints bilateral. Muscle strength 5/5 in all groups bilateral.   Radiographic Exam:  Normal osseous mineralization. Joint spaces preserved. No fracture/dislocation/boney destruction indicative of osteomyelitis.    Assessment: #1 Diabetes mellitus with neuropathy #2 ulcer plantar aspect of the left great toe secondary to diabetes mellitus  #3 below-knee amputation right lower extremity    Plan of Care:  #1 Patient was evaluated. #2 Medically necessary excisional debridement including subcutaneous tissues performed using a tissue nipper. Excisional debridement of all necrotic  nonviable tissue down to healthy bleeding viable tissue was performed with post-debridement measurements and was pre-. #3 the wound was cleansed with normal saline. #4 cultures were taken today. #5 prescription for Bactrim #6 prescription for Prisma and Aquacel Ag ordered  #7 postop shoe dispensed #8 patient is to return to clinic in 1 week   Dr. Edrick Kins, [place]

## 2016-01-15 ENCOUNTER — Ambulatory Visit (INDEPENDENT_AMBULATORY_CARE_PROVIDER_SITE_OTHER): Payer: Medicare Other | Admitting: Podiatry

## 2016-01-15 DIAGNOSIS — L97529 Non-pressure chronic ulcer of other part of left foot with unspecified severity: Secondary | ICD-10-CM

## 2016-01-15 DIAGNOSIS — E11621 Type 2 diabetes mellitus with foot ulcer: Secondary | ICD-10-CM

## 2016-01-15 DIAGNOSIS — L97522 Non-pressure chronic ulcer of other part of left foot with fat layer exposed: Secondary | ICD-10-CM | POA: Diagnosis not present

## 2016-01-15 DIAGNOSIS — I70245 Atherosclerosis of native arteries of left leg with ulceration of other part of foot: Secondary | ICD-10-CM

## 2016-01-15 DIAGNOSIS — E08621 Diabetes mellitus due to underlying condition with foot ulcer: Secondary | ICD-10-CM

## 2016-01-15 DIAGNOSIS — L97509 Non-pressure chronic ulcer of other part of unspecified foot with unspecified severity: Secondary | ICD-10-CM

## 2016-01-15 DIAGNOSIS — M79672 Pain in left foot: Secondary | ICD-10-CM

## 2016-01-21 NOTE — Progress Notes (Signed)
Patient ID: Tim Becker, male   DOB: 09-19-1965, 50 y.o.   MRN: [id number] Subjective:  50 year old male with a history of diabetes mellitus and currently on dialysis presents to the office today for evaluation of a left great toe ulcer. Patient states that he's noticed the ulcer for approximately 3 weeks now. Patient has been diagnosed with diabetes mellitus since 1997 and underwent a below-knee amputation to the right foot secondary to diabetes mellitus last year.    Objective/Physical Exam General: The patient is alert and oriented x3 in no acute distress.  Dermatology: Ulcer noted to the plantar aspect of the left great toe measuring 2.5 cm in length by 2.0 cm in width by 0.3 cm in depth. Measurements unchanged since last visit  To the above-noted ulceration there is no eschar there is extensive amount of slough fibrin and necrotic tissue granulation tissue and wound base is red with extensive yellow fibrotic tissue. There is a moderate amount of serosanguineous drainage noted. Mild malodor. There is exposed tendon, there is no exposed bone ligament or joint. Periwound integrity is callused.  Vascular: Palpable pedal pulses bilaterally. No edema or erythema noted. Capillary refill within normal limits.  Neurological: Epicritic and protective threshold absent bilaterally.   Musculoskeletal Exam: Range of motion within normal limits to all pedal and ankle joints bilateral. Muscle strength 5/5 in all groups bilateral.   Radiographic Exam:  Normal osseous mineralization. Joint spaces preserved. No fracture/dislocation/boney destruction indicative of osteomyelitis.    Assessment: #1 Diabetes mellitus with neuropathy #2 ulcer plantar aspect of the left great toe secondary to diabetes mellitus  #3 below-knee amputation right lower extremity    Plan of Care:  #1 Patient was evaluated. #2 Medically necessary excisional debridement including subcutaneous tissues performed using a tissue  nipper. Excisional debridement of all necrotic nonviable tissue down to healthy bleeding viable tissue was performed with post-debridement measurements and was pre-. #3 the wound was cleansed with normal saline. #4 culture is pending  #5 continue Bactrim #6 continue dressing changes including Prisma and Aquacel Ag #7 continue wearing postop shoe #8 1 inch Coban dressing ordered today #9 patient is to return to clinic in 1 week   Dr. Edrick Kins, [place]

## 2016-01-29 ENCOUNTER — Ambulatory Visit (INDEPENDENT_AMBULATORY_CARE_PROVIDER_SITE_OTHER): Payer: Medicare Other | Admitting: Podiatry

## 2016-01-29 DIAGNOSIS — I70245 Atherosclerosis of native arteries of left leg with ulceration of other part of foot: Secondary | ICD-10-CM

## 2016-01-29 DIAGNOSIS — E08621 Diabetes mellitus due to underlying condition with foot ulcer: Secondary | ICD-10-CM

## 2016-01-29 DIAGNOSIS — L97522 Non-pressure chronic ulcer of other part of left foot with fat layer exposed: Secondary | ICD-10-CM | POA: Diagnosis not present

## 2016-01-29 DIAGNOSIS — L97529 Non-pressure chronic ulcer of other part of left foot with unspecified severity: Secondary | ICD-10-CM

## 2016-01-29 DIAGNOSIS — E11621 Type 2 diabetes mellitus with foot ulcer: Secondary | ICD-10-CM | POA: Diagnosis not present

## 2016-01-29 DIAGNOSIS — E1143 Type 2 diabetes mellitus with diabetic autonomic (poly)neuropathy: Secondary | ICD-10-CM

## 2016-01-29 DIAGNOSIS — M79672 Pain in left foot: Secondary | ICD-10-CM

## 2016-01-29 DIAGNOSIS — L97509 Non-pressure chronic ulcer of other part of unspecified foot with unspecified severity: Secondary | ICD-10-CM

## 2016-02-04 NOTE — Progress Notes (Signed)
Patient ID: Tim Becker, male   DOB: 1966-03-23, 50 y.o.   MRN: [id number] Subjective:  50 year old male with a history of diabetes mellitus and currently on dialysis presents to the office today for evaluation of a left great toe ulcer. Patient states that he's noticed the ulcer for approximately 3 weeks now. Patient has been diagnosed with diabetes mellitus since 1997 and underwent a below-knee amputation to the right foot secondary to diabetes mellitus last year.    Objective/Physical Exam General: The patient is alert and oriented x3 in no acute distress.  Dermatology: Ulcer noted to the plantar aspect of the left great toe measuring 2.5 cm in length by 2.0 cm in width by 0.3 cm in depth. Measurements unchanged since last visit  To the above-noted ulceration there is no eschar there is extensive amount of slough fibrin and necrotic tissue granulation tissue and wound base is red with extensive yellow fibrotic tissue. There is a moderate amount of serosanguineous drainage noted. Mild malodor. There is exposed tendon, there is no exposed bone ligament or joint. Periwound integrity is callused.  Vascular: Palpable pedal pulses bilaterally. No edema or erythema noted. Capillary refill within normal limits.  Neurological: Epicritic and protective threshold absent bilaterally.   Musculoskeletal Exam: Range of motion within normal limits to all pedal and ankle joints bilateral. Muscle strength 5/5 in all groups bilateral.   Radiographic Exam:  Normal osseous mineralization. Joint spaces preserved. No fracture/dislocation/boney destruction indicative of osteomyelitis.    Assessment: #1 Diabetes mellitus with neuropathy #2 ulcer plantar aspect of the left great toe secondary to diabetes mellitus  #3 below-knee amputation right lower extremity    Plan of Care:  #1 Patient was evaluated. #2 Medically necessary excisional debridement including subcutaneous tissues performed using a tissue  nipper. Excisional debridement of all necrotic nonviable tissue down to healthy bleeding viable tissue was performed with post-debridement measurements and was pre-. #3 the wound was cleansed with normal saline. #4 culture is positive for MRSA. Antibiotic therapy is being managed at his dialysis center. #5 patient was advised to discontinue Bactrim as per dialysis Center #6 continue dressing changes including Prisma and Aquacel Ag #7 continue wearing postop shoe #8 1 inch Coban dressing ordered today #9 patient will require medically necessary application of appropriate matrix Integra skin graft. Please note this gradually medically necessary. All conservative treatments have been unsuccessful in providing any sore satisfactory alleviation of symptoms for the patient. #10 patient is to return to clinic in 2 week   Dr. Edrick Kins, [place]

## 2016-02-05 ENCOUNTER — Telehealth: Payer: Self-pay | Admitting: *Deleted

## 2016-02-05 NOTE — Telephone Encounter (Signed)
[place] states pt gave paperwork from [place] to someone at their office and now it can not be found, refax to [phone].

## 2016-02-05 NOTE — Telephone Encounter (Signed)
Called and spoke with Marcello Moores at [facility] and he told me that they had found the wound culture from 11 October. Stated patient was on anti biotics but is no longer on them know. Stated if he ever needs another culture to fax it to them at [phone]. Also stated if he needs to be on anti biotics that our office could contact them.

## 2016-02-12 ENCOUNTER — Telehealth: Payer: Self-pay | Admitting: *Deleted

## 2016-02-12 ENCOUNTER — Ambulatory Visit (INDEPENDENT_AMBULATORY_CARE_PROVIDER_SITE_OTHER): Payer: Medicare Other | Admitting: Podiatry

## 2016-02-12 DIAGNOSIS — L97522 Non-pressure chronic ulcer of other part of left foot with fat layer exposed: Secondary | ICD-10-CM | POA: Diagnosis not present

## 2016-02-12 DIAGNOSIS — E08621 Diabetes mellitus due to underlying condition with foot ulcer: Secondary | ICD-10-CM

## 2016-02-12 DIAGNOSIS — L97509 Non-pressure chronic ulcer of other part of unspecified foot with unspecified severity: Secondary | ICD-10-CM

## 2016-02-12 DIAGNOSIS — L97529 Non-pressure chronic ulcer of other part of left foot with unspecified severity: Secondary | ICD-10-CM

## 2016-02-12 DIAGNOSIS — I70245 Atherosclerosis of native arteries of left leg with ulceration of other part of foot: Secondary | ICD-10-CM

## 2016-02-12 DIAGNOSIS — E11621 Type 2 diabetes mellitus with foot ulcer: Secondary | ICD-10-CM

## 2016-02-12 DIAGNOSIS — M79672 Pain in left foot: Secondary | ICD-10-CM

## 2016-02-12 NOTE — Telephone Encounter (Signed)
Dr. Amalia Hailey ordered Prisma, 2x2 gauze, 1 inch coban and Aquacel AG for daily dressing changes to left plantar great toe diabetic ulcer, measurements 3.0cm x 2.0cm x 0.3 cm with moderate exudate. Faxed to [facility].

## 2016-02-12 NOTE — Progress Notes (Signed)
Patient ID: Tim Becker, male   DOB: 1965/10/15, 50 y.o.   MRN: [id number] Subjective:  50 year old male with a history of diabetes mellitus and currently on dialysis presents to the office today for evaluation of a left great toe ulcer. Patient states that he's noticed the ulcer for approximately 3 weeks now. Patient has been diagnosed with diabetes mellitus since 1997 and underwent a below-knee amputation to the right foot secondary to diabetes mellitus last year.    Objective/Physical Exam General: The patient is alert and oriented x3 in no acute distress.  Dermatology: Ulcer noted to the plantar aspect of the left great toe measuring 2.5 cm in length by 2.0 cm in width by 0.3 cm in depth. Measurements unchanged since last visit  To the above-noted ulceration there is no eschar there is extensive amount of slough fibrin and necrotic tissue granulation tissue and wound base is red with extensive yellow fibrotic tissue. There is a moderate amount of serosanguineous drainage noted. Mild malodor. There is exposed tendon, there is no exposed bone ligament or joint. Periwound integrity is callused.  Vascular: Palpable pedal pulses bilaterally. No edema or erythema noted. Capillary refill within normal limits.  Neurological: Epicritic and protective threshold absent bilaterally.   Musculoskeletal Exam: Range of motion within normal limits to all pedal and ankle joints bilateral. Muscle strength 5/5 in all groups bilateral.   Radiographic Exam:  Normal osseous mineralization. Joint spaces preserved. No fracture/dislocation/boney destruction indicative of osteomyelitis.    Assessment: #1 Diabetes mellitus with neuropathy #2 ulcer plantar aspect of the left great toe secondary to diabetes mellitus  #3 below-knee amputation right lower extremity    Plan of Care:  #1 Patient was evaluated. #2 Medically necessary excisional debridement including subcutaneous tissues performed using a tissue  nipper. Excisional debridement of all necrotic nonviable tissue down to healthy bleeding viable tissue was performed with post-debridement measurements and was pre-. #3 the wound was cleansed with normal saline. #4 culture is positive for MRSA. Antibiotic therapy is being managed at his dialysis center. #5 patient was advised to discontinue Bactrim as per dialysis Center #6 continue dressing changes including Prisma and Aquacel Ag #7 continue wearing postop shoe #8 1 inch Coban dressing ordered today #9 patient will require medically necessary application of appropriate matrix Integra skin graft. Please note this gradually medically necessary. All conservative treatments have been unsuccessful in providing any sore satisfactory alleviation of symptoms for the patient. #10 patient is to return to clinic in 2 week   Dr. Edrick Kins, [place]

## 2016-02-13 ENCOUNTER — Ambulatory Visit
Admission: RE | Admit: 2016-02-13 | Discharge: 2016-02-13 | Disposition: A | Discharge status: Home / Self Care | Payer: Medicare Other | Source: Ambulatory Visit | Attending: Urology | Admitting: Urology

## 2016-02-13 DIAGNOSIS — Z905 Acquired absence of kidney: Secondary | ICD-10-CM | POA: Insufficient documentation

## 2016-02-13 DIAGNOSIS — K8020 Calculus of gallbladder without cholecystitis without obstruction: Secondary | ICD-10-CM | POA: Insufficient documentation

## 2016-02-13 DIAGNOSIS — I700 Atherosclerosis of aorta: Secondary | ICD-10-CM | POA: Insufficient documentation

## 2016-02-13 DIAGNOSIS — Z9889 Other specified postprocedural states: Secondary | ICD-10-CM | POA: Insufficient documentation

## 2016-02-13 DIAGNOSIS — C642 Malignant neoplasm of left kidney, except renal pelvis: Secondary | ICD-10-CM

## 2016-02-13 MED ORDER — IOPAMIDOL (ISOVUE-370) INJECTION 76%
100.0000 mL | Freq: Once | INTRAVENOUS | Status: AC | PRN
  Administered 2016-02-13: 100 mL via INTRAVENOUS

## 2016-02-15 ENCOUNTER — Telehealth: Payer: Self-pay

## 2016-02-15 ENCOUNTER — Ambulatory Visit
Admission: RE | Admit: 2016-02-15 | Discharge: 2016-02-15 | Disposition: A | Discharge status: Home / Self Care | Payer: Medicare Other | Source: Ambulatory Visit | Attending: Urology | Admitting: Urology

## 2016-02-15 ENCOUNTER — Ambulatory Visit: Payer: Medicare Other | Admitting: Urology

## 2016-02-15 ENCOUNTER — Encounter: Payer: Self-pay | Admitting: Urology

## 2016-02-15 VITALS — BP 138/75 | HR 75 | Ht 67.0 in | Wt 254.0 lb

## 2016-02-15 DIAGNOSIS — N186 End stage renal disease: Secondary | ICD-10-CM | POA: Diagnosis not present

## 2016-02-15 DIAGNOSIS — C649 Malignant neoplasm of unspecified kidney, except renal pelvis: Secondary | ICD-10-CM | POA: Insufficient documentation

## 2016-02-15 DIAGNOSIS — I517 Cardiomegaly: Secondary | ICD-10-CM | POA: Insufficient documentation

## 2016-02-15 DIAGNOSIS — Z85528 Personal history of other malignant neoplasm of kidney: Secondary | ICD-10-CM | POA: Diagnosis not present

## 2016-02-15 DIAGNOSIS — Z992 Dependence on renal dialysis: Secondary | ICD-10-CM | POA: Diagnosis not present

## 2016-02-15 NOTE — Telephone Encounter (Signed)
Patient notified/SW 

## 2016-02-15 NOTE — Progress Notes (Signed)
02/15/2016 8:46 AM   Tim Becker 03/31/1966 [id number]  Referring provider: No referring provider defined for this encounter.  Chief Complaint  Patient presents with  . Follow-up    40month w/CTscan results    HPI: 50 year old male on hemodialysis for end-stage renal disease found to have a 4 cm enhancing left renal mass now s/p left hand-assisted radical nephrectomy on 07/11/15 who returns today for routine follow up.  Pathology was consistent with papillary renal cell carcinoma (type I) measuring 4 cm, pT1a N0 M0 With significant incidental glomerulosclerosis.  Today, he has no complaints.  Incision well healed.  No post op issues.    CT abd/ pelvis as below.    PMH: Past Medical History:  Diagnosis Date  . Anemia   . Arthritis   . Cancer [facility])    Renal Tumor  . CKD (chronic kidney disease)   . Coronary artery disease   . Diabetes ([place])   . DM (diabetes mellitus) with complications ([place])   . ESRD (end stage renal disease) ([place])    Pre- dialysis  . Hypertension   . Left kidney mass   . Neuropathy ([place])   . Osteomyelitis, chronic, ankle or foot ([place])   . PONV (postoperative nausea and vomiting)   . Renal insufficiency    Patient is on Dialysis and receives M,W and F.  . S/P BKA (below knee amputation) [facility]) June 2016   Right , [place], [place]  . Sleep apnea    CPAP  . Thyroid disease     Surgical History: Past Surgical History:  Procedure Laterality Date  . BASCILIC VEIN TRANSPOSITION Left 02/17/2015   Procedure: BASCILIC VEIN TRANSPOSITION;  Surgeon: Katha Cabal, MD;  Location: [facility] ORS;  Service: Vascular;  Laterality: Left;  . BELOW KNEE LEG AMPUTATION Right   . CORONARY ANGIOPLASTY WITH STENT PLACEMENT    . EYE SURGERY    . FOOT SURGERY     Multiple R foot surgery for infection and charcot foot  . JOINT REPLACEMENT    . LAPAROSCOPIC NEPHRECTOMY, HAND ASSISTED Left 07/11/2015   Procedure: HAND ASSISTED LAPAROSCOPIC NEPHRECTOMY;  Surgeon:  Hollice Espy, MD;  Location: [facility] ORS;  Service: Urology;  Laterality: Left;  . PERIPHERAL VASCULAR CATHETERIZATION Left 12/06/2014   Procedure: A/V Shuntogram/Fistulagram;  Surgeon: Katha Cabal, MD;  Location: [place] CV LAB;  Service: Cardiovascular;  Laterality: Left;  . PERIPHERAL VASCULAR CATHETERIZATION Left 12/06/2014   Procedure: A/V Shunt Intervention;  Surgeon: Katha Cabal, MD;  Location: [place] [facility] LAB;  Service: Cardiovascular;  Laterality: Left;  . TONSILLECTOMY      Home Medications:    Medication List       Accurate as of 02/15/16  8:46 AM. Always use your most recent med list.          calcium acetate 667 MG capsule Commonly known as:  PHOSLO Take 2,001 mg by mouth 3 (three) times daily with meals. 667 mg with snacks   carvedilol 12.5 MG tablet Commonly known as:  COREG Take 12.5 mg by mouth 2 (two) times daily with a meal.   insulin lispro 100 UNIT/ML injection Commonly known as:  HUMALOG Inject into the skin 3 (three) times daily before meals.   LANTUS SOLOSTAR 100 UNIT/ML Solostar Pen Generic drug:  Insulin Glargine Inject 80 Units into the skin daily.       Allergies: No Known Allergies  Family History: Family History  Problem Relation Age of Onset  . Diabetes Mother   .  Kidney disease Mother   . Breast cancer Mother     Social History:  reports that he has never smoked. He has never used smokeless tobacco. He reports that he does not drink alcohol or use drugs.  ROS: UROLOGY Frequent Urination?: No Hard to postpone urination?: No Burning/pain with urination?: No Get up at night to urinate?: No Leakage of urine?: No Urine stream starts and stops?: No Trouble starting stream?: No Do you have to strain to urinate?: No Blood in urine?: No Urinary tract infection?: No Sexually transmitted disease?: No Injury to kidneys or bladder?: No Painful intercourse?: No Weak stream?: No Erection problems?: No Penile pain?:  No  Gastrointestinal Nausea?: No Vomiting?: No Indigestion/heartburn?: No Diarrhea?: No Constipation?: Yes  Constitutional Fever: No Night sweats?: No Weight loss?: No Fatigue?: No  Skin Skin rash/lesions?: No Itching?: Yes  Eyes Blurred vision?: Yes Double vision?: No  Ears/Nose/Throat Sore throat?: No Sinus problems?: No  Hematologic/Lymphatic Swollen glands?: No Easy bruising?: No  Cardiovascular Leg swelling?: No Chest pain?: No  Respiratory Cough?: Yes Shortness of breath?: No  Endocrine Excessive thirst?: No  Musculoskeletal Back pain?: No Joint pain?: No  Neurological Headaches?: No Dizziness?: No  Psychologic Depression?: No Anxiety?: No  Physical Exam: BP 138/75   Pulse 75   Ht 5\' 7"  (1.702 m)   Wt 254 lb (115.2 kg)   BMI 39.78 kg/m   Constitutional:  Alert and oriented, No acute distress. HEENT: [place] AT, moist mucus membranes.  Trachea midline, no masses. Cardiovascular: No clubbing, cyanosis, or edema. Respiratory: Normal respiratory effort, no increased work of breathing. GI: Abdomen is soft, nontender, nondistended, no abdominal masses.  Abdominal incisions well healed scar.  GU: No CVA tenderness.  Skin: No rashes, bruises or suspicious lesions. Neurologic: Grossly intact, no focal deficits, moving all 4 extremities. Psychiatric: Normal mood and affect.  Laboratory Data: Lab Results  Component Value Date   WBC 11.3 (H) 07/12/2015   HGB 11.4 (L) 07/12/2015   HCT 36.1 (L) 07/12/2015   MCV 89.3 07/12/2015   PLT 216 07/12/2015    Lab Results  Component Value Date   CREATININE 8.97 (H) 07/12/2015   Imaging: CLINICAL DATA:  Followup left renal cell carcinoma. Seven months status post left nephrectomy.  EXAM: CT ABDOMEN WITHOUT AND WITH CONTRAST  TECHNIQUE: Multidetector CT imaging of the abdomen was performed following the standard protocol before and following the bolus administration of intravenous  contrast.  CONTRAST:  100 mL Isovue 370  COMPARISON:  06/01/2015  FINDINGS: Lower chest: No acute findings.  Hepatobiliary: No mass identified. Tiny calcified gallstones are noted, however there is no evidence of cholecystitis or biliary ductal dilatation.  Pancreas:  No mass or inflammatory changes.  Spleen:  Within normal limits in size and appearance.  Adrenals/Urinary Tract: Normal adrenal glands. Normal appearance of right kidney. Patient has undergone left nephrectomy since previous study. No residual soft tissue mass identified within the nephrectomy bed.  Stomach/Bowel: Visualized portions within the abdomen are unremarkable.  Vascular/Lymphatic: Several sub-cm left retroperitoneal lymph nodes are seen in the paraaortic region, largest measuring 9 mm on image 53/6. These are stable since previous study. No new or enlarging lymph nodes identified. No abdominal aortic aneurysm demonstrated. Aortic atherosclerosis.  Other:  None.  Musculoskeletal:  No suspicious bone lesions identified.  IMPRESSION: Expected postop changes from interval left nephrectomy. No evidence of locally recurrent carcinoma.  Stable sub-cm left paraaortic retroperitoneal lymph nodes. No other sites of metastatic disease identified within the abdomen.  Cholelithiasis.  No radiographic evidence of cholecystitis.  Aortic atherosclerosis.   Electronically Signed   By: Earle Gell M.D.   On: 02/13/2016 09:01  CT reviewed personally  Assessment & Plan:   1. Renal cell carcinoma, left (HCC) Status post uncomplicated laparoscopic left radical nephrectomy on 07/2015 Pathology consistent with type I papillary RCC, pT1aN0M NED today on CT abd/ pelvis with and without contrast Recommended CXR today Recommend annual screening with CT abd/pelvis and CXR annually x 3 years - CT Abd Wo & W Cm; Future  2.  ESRD on HD   Return in about 1 year (around 02/14/2017) for CT  abd/pelvis, CXR.  Hollice Espy, MD  [facility] [address] [place], [place] [address] [phone]

## 2016-02-15 NOTE — Telephone Encounter (Signed)
-----   Message from Hollice Espy, MD sent at 02/15/2016 11:14 AM EST ----- CXR looks good.   Hollice Espy, MD

## 2016-02-26 ENCOUNTER — Ambulatory Visit (INDEPENDENT_AMBULATORY_CARE_PROVIDER_SITE_OTHER): Payer: Medicare Other

## 2016-02-26 ENCOUNTER — Ambulatory Visit (INDEPENDENT_AMBULATORY_CARE_PROVIDER_SITE_OTHER): Payer: Medicare Other | Admitting: Podiatry

## 2016-02-26 ENCOUNTER — Encounter: Payer: Self-pay | Admitting: Podiatry

## 2016-02-26 DIAGNOSIS — I70245 Atherosclerosis of native arteries of left leg with ulceration of other part of foot: Secondary | ICD-10-CM

## 2016-02-26 DIAGNOSIS — L97509 Non-pressure chronic ulcer of other part of unspecified foot with unspecified severity: Secondary | ICD-10-CM | POA: Diagnosis not present

## 2016-02-26 DIAGNOSIS — L97529 Non-pressure chronic ulcer of other part of left foot with unspecified severity: Secondary | ICD-10-CM | POA: Diagnosis not present

## 2016-02-26 DIAGNOSIS — M79672 Pain in left foot: Secondary | ICD-10-CM | POA: Diagnosis not present

## 2016-02-26 DIAGNOSIS — L97522 Non-pressure chronic ulcer of other part of left foot with fat layer exposed: Secondary | ICD-10-CM

## 2016-02-26 DIAGNOSIS — E1143 Type 2 diabetes mellitus with diabetic autonomic (poly)neuropathy: Secondary | ICD-10-CM | POA: Diagnosis not present

## 2016-02-26 DIAGNOSIS — E11621 Type 2 diabetes mellitus with foot ulcer: Secondary | ICD-10-CM

## 2016-02-26 DIAGNOSIS — E08621 Diabetes mellitus due to underlying condition with foot ulcer: Secondary | ICD-10-CM | POA: Diagnosis not present

## 2016-02-27 NOTE — Progress Notes (Signed)
Patient ID: Tim Becker, male   DOB: [birth year], 50 y.o.   MRN: [id number] Subjective:  50 year old male with a history of diabetes mellitus and currently on dialysis presents to the office today for evaluation of a left great toe ulcer. Patient states that he's noticed the ulcer for approximately 3 weeks now. Patient has been diagnosed with diabetes mellitus since 1997 and underwent a below-knee amputation to the right lower extremity secondary to diabetes mellitus last year.    Objective/Physical Exam General: The patient is alert and oriented x3 in no acute distress.  Dermatology: Ulcer noted to the plantar aspect of the left great toe measuring 2.5 cm in length by 2.0 cm in width by 0.3 cm in depth. Measurements unchanged since last visit  To the above-noted ulceration there is no eschar there is extensive amount of slough fibrin and necrotic tissue granulation tissue and wound base is red with extensive yellow fibrotic tissue. There is a moderate amount of serosanguineous drainage noted. Mild malodor. There is exposed tendon, there is no exposed bone ligament or joint. Periwound integrity is callused.  Vascular: Palpable pedal pulses bilaterally. No edema or erythema noted. Capillary refill within normal limits.  Neurological: Epicritic and protective threshold absent bilaterally.   Musculoskeletal Exam: Range of motion within normal limits to all pedal and ankle joints bilateral. Muscle strength 5/5 in all groups bilateral.   Radiographic Exam:  Normal osseous mineralization. Joint spaces preserved. No fracture/dislocation/boney destruction indicative of osteomyelitis.    Assessment: #1 Diabetes mellitus with neuropathy #2 ulcer plantar aspect of the left great toe secondary to diabetes mellitus  #3 below-knee amputation right lower extremity    Plan of Care:  #1 Patient was evaluated. #2 Medically necessary excisional debridement including subcutaneous tissues performed using a  tissue nipper. Excisional debridement of all necrotic nonviable tissue down to healthy bleeding viable tissue was performed with post-debridement measurements and was pre-. #3 the wound was cleansed with normal saline. #4 culture is positive for MRSA. Antibiotic therapy is being managed at his dialysis center. #5 continue dressing changes including Prisma and Aquacel Ag #7 continue wearing postop shoe #8 1 inch Coban dressing ordered today #9 patient will require medically necessary application of appropriate matrix Integra skin graft. Please note this gradually medically necessary. All conservative treatments have been unsuccessful in providing any sore satisfactory alleviation of symptoms for the patient. #10 patient is to return to clinic in 2 week   Dr. Edrick Kins, [place]

## 2016-03-11 ENCOUNTER — Encounter: Payer: Self-pay | Admitting: Podiatry

## 2016-03-11 ENCOUNTER — Telehealth: Payer: Self-pay | Admitting: *Deleted

## 2016-03-11 ENCOUNTER — Ambulatory Visit (INDEPENDENT_AMBULATORY_CARE_PROVIDER_SITE_OTHER): Payer: Medicare Other | Admitting: Podiatry

## 2016-03-11 DIAGNOSIS — L97529 Non-pressure chronic ulcer of other part of left foot with unspecified severity: Principal | ICD-10-CM

## 2016-03-11 DIAGNOSIS — E11621 Type 2 diabetes mellitus with foot ulcer: Secondary | ICD-10-CM

## 2016-03-11 DIAGNOSIS — E1143 Type 2 diabetes mellitus with diabetic autonomic (poly)neuropathy: Secondary | ICD-10-CM

## 2016-03-11 DIAGNOSIS — L97522 Non-pressure chronic ulcer of other part of left foot with fat layer exposed: Secondary | ICD-10-CM

## 2016-03-11 DIAGNOSIS — E08621 Diabetes mellitus due to underlying condition with foot ulcer: Secondary | ICD-10-CM

## 2016-03-11 DIAGNOSIS — L97509 Non-pressure chronic ulcer of other part of unspecified foot with unspecified severity: Secondary | ICD-10-CM

## 2016-03-11 DIAGNOSIS — I70245 Atherosclerosis of native arteries of left leg with ulceration of other part of foot: Secondary | ICD-10-CM

## 2016-03-12 NOTE — Telephone Encounter (Addendum)
-----   Message from Edrick Kins, DPM sent at 03/11/2016 11:12 AM EST ----- Please refer to Dr. Con Memos at the wound care center.  Interested in [place].   Dx : diabetic foot ulcer left great toe. Below knee amputation right lower extremity.   Thanks. Dr. Amalia Hailey. 03/12/2016-Faxed required form, referral, demographics and clinicals to [facility].

## 2016-03-17 NOTE — Progress Notes (Signed)
Patient ID: Tim Becker, male   DOB: [birth year], 50 y.o.   MRN: [id number] Subjective:  50 year old male with a history of diabetes mellitus and currently on dialysis presents to the office today for evaluation of a left great toe ulcer. Patient states that he's noticed the ulcer for > 6 weeks now. Patient has been diagnosed with diabetes mellitus since 1997 and underwent a below-knee amputation to the right lower extremity secondary to diabetes mellitus last year.    Objective/Physical Exam General: The patient is alert and oriented x3 in no acute distress.  Dermatology: Ulcer noted to the plantar aspect of the left great toe measuring 2.5 cm in length by 2.0 cm in width by 0.3 cm in depth. Measurements unchanged since last visit  To the above-noted ulceration there is no eschar there is extensive amount of slough fibrin and necrotic tissue granulation tissue and wound base is red with extensive yellow fibrotic tissue. There is a moderate amount of serosanguineous drainage noted. Mild malodor. There is exposed tendon, there is no exposed bone ligament or joint. Periwound integrity is callused.  Vascular: Palpable pedal pulses bilaterally. No edema or erythema noted. Capillary refill within normal limits.  Neurological: Epicritic and protective threshold absent bilaterally.   Musculoskeletal Exam: Range of motion within normal limits to all pedal and ankle joints left lower extremity. Muscle strength 5/5 in all groups bilateral.eft.  Below-knee amputation right lower extremity  Radiographic Exam:  Normal osseous mineralization. Joint spaces preserved. No fracture/dislocation/boney destruction indicative of osteomyelitis.    Assessment: #1 Diabetes mellitus with neuropathy #2 ulcer plantar aspect of the left great toe secondary to diabetes mellitus  #3 below-knee amputation right lower extremity    Plan of Care:  #1 Patient was evaluated. #2 Medically necessary excisional debridement  including subcutaneous tissues performed using a tissue nipper. Excisional debridement of all necrotic nonviable tissue down to healthy bleeding viable tissue was performed with post-debridement measurements and was pre-. #3 the wound was cleansed with normal saline. #4 culture is positive for MRSA. Antibiotic therapy is being managed at his dialysis center. #5 continue dressing changes including Prisma and Aquacel Ag #7 continue wearing postop shoe #8 today we discussed in detail the several different treatment options including hyperbarics. The patient would like to consider hyperbaric management and treatment. Patient will be referred to Dr. Con Memos at the wound care center at [facility]. #9 return to clinic when necessary. Patient will be managed from here on at the wound care center.   Dr. Edrick Kins, [place]

## 2016-04-02 ENCOUNTER — Other Ambulatory Visit: Payer: Self-pay | Admitting: Surgery

## 2016-04-02 ENCOUNTER — Ambulatory Visit (HOSPITAL_COMMUNITY)
Admission: RE | Admit: 2016-04-02 | Discharge: 2016-04-02 | Disposition: A | Discharge status: Home / Self Care | Payer: Medicare Other | Source: Ambulatory Visit | Attending: Surgery | Admitting: Surgery

## 2016-04-02 ENCOUNTER — Encounter (HOSPITAL_BASED_OUTPATIENT_CLINIC_OR_DEPARTMENT_OTHER): Payer: Medicare Other | Attending: Surgery

## 2016-04-02 DIAGNOSIS — M89572 Osteolysis, left ankle and foot: Secondary | ICD-10-CM | POA: Insufficient documentation

## 2016-04-02 DIAGNOSIS — N184 Chronic kidney disease, stage 4 (severe): Secondary | ICD-10-CM | POA: Insufficient documentation

## 2016-04-02 DIAGNOSIS — Z992 Dependence on renal dialysis: Secondary | ICD-10-CM | POA: Diagnosis not present

## 2016-04-02 DIAGNOSIS — I129 Hypertensive chronic kidney disease with stage 1 through stage 4 chronic kidney disease, or unspecified chronic kidney disease: Secondary | ICD-10-CM | POA: Diagnosis not present

## 2016-04-02 DIAGNOSIS — M86372 Chronic multifocal osteomyelitis, left ankle and foot: Secondary | ICD-10-CM | POA: Insufficient documentation

## 2016-04-02 DIAGNOSIS — E1140 Type 2 diabetes mellitus with diabetic neuropathy, unspecified: Secondary | ICD-10-CM | POA: Diagnosis not present

## 2016-04-02 DIAGNOSIS — E669 Obesity, unspecified: Secondary | ICD-10-CM | POA: Insufficient documentation

## 2016-04-02 DIAGNOSIS — E039 Hypothyroidism, unspecified: Secondary | ICD-10-CM | POA: Diagnosis not present

## 2016-04-02 DIAGNOSIS — M86172 Other acute osteomyelitis, left ankle and foot: Secondary | ICD-10-CM

## 2016-04-02 DIAGNOSIS — Z794 Long term (current) use of insulin: Secondary | ICD-10-CM | POA: Diagnosis not present

## 2016-04-02 DIAGNOSIS — L97529 Non-pressure chronic ulcer of other part of left foot with unspecified severity: Secondary | ICD-10-CM | POA: Insufficient documentation

## 2016-04-02 DIAGNOSIS — E1169 Type 2 diabetes mellitus with other specified complication: Secondary | ICD-10-CM | POA: Diagnosis not present

## 2016-04-02 DIAGNOSIS — Z79899 Other long term (current) drug therapy: Secondary | ICD-10-CM | POA: Diagnosis not present

## 2016-04-02 DIAGNOSIS — Z85528 Personal history of other malignant neoplasm of kidney: Secondary | ICD-10-CM | POA: Diagnosis not present

## 2016-04-02 DIAGNOSIS — Z905 Acquired absence of kidney: Secondary | ICD-10-CM | POA: Insufficient documentation

## 2016-04-02 DIAGNOSIS — Z6838 Body mass index (BMI) 38.0-38.9, adult: Secondary | ICD-10-CM | POA: Diagnosis not present

## 2016-04-02 DIAGNOSIS — L97524 Non-pressure chronic ulcer of other part of left foot with necrosis of bone: Secondary | ICD-10-CM | POA: Insufficient documentation

## 2016-04-02 DIAGNOSIS — E11621 Type 2 diabetes mellitus with foot ulcer: Secondary | ICD-10-CM | POA: Diagnosis not present

## 2016-04-02 DIAGNOSIS — Z89511 Acquired absence of right leg below knee: Secondary | ICD-10-CM | POA: Insufficient documentation

## 2016-04-02 DIAGNOSIS — G4730 Sleep apnea, unspecified: Secondary | ICD-10-CM | POA: Insufficient documentation

## 2016-04-02 DIAGNOSIS — E1122 Type 2 diabetes mellitus with diabetic chronic kidney disease: Secondary | ICD-10-CM | POA: Insufficient documentation

## 2016-04-02 DIAGNOSIS — I2510 Atherosclerotic heart disease of native coronary artery without angina pectoris: Secondary | ICD-10-CM | POA: Insufficient documentation

## 2016-04-04 ENCOUNTER — Other Ambulatory Visit: Payer: Self-pay | Admitting: Surgery

## 2016-04-04 DIAGNOSIS — M869 Osteomyelitis, unspecified: Principal | ICD-10-CM

## 2016-04-04 DIAGNOSIS — E11621 Type 2 diabetes mellitus with foot ulcer: Secondary | ICD-10-CM

## 2016-04-04 DIAGNOSIS — E1169 Type 2 diabetes mellitus with other specified complication: Principal | ICD-10-CM

## 2016-04-04 DIAGNOSIS — L97509 Non-pressure chronic ulcer of other part of unspecified foot with unspecified severity: Principal | ICD-10-CM

## 2016-04-09 ENCOUNTER — Other Ambulatory Visit: Payer: Self-pay | Admitting: Surgery

## 2016-04-09 ENCOUNTER — Ambulatory Visit (HOSPITAL_COMMUNITY)
Admission: RE | Admit: 2016-04-09 | Discharge: 2016-04-09 | Disposition: A | Discharge status: Home / Self Care | Payer: Medicare Other | Source: Ambulatory Visit | Attending: Surgery | Admitting: Surgery

## 2016-04-09 ENCOUNTER — Other Ambulatory Visit: Payer: Self-pay

## 2016-04-09 DIAGNOSIS — E1169 Type 2 diabetes mellitus with other specified complication: Principal | ICD-10-CM

## 2016-04-09 DIAGNOSIS — L97509 Non-pressure chronic ulcer of other part of unspecified foot with unspecified severity: Secondary | ICD-10-CM | POA: Diagnosis not present

## 2016-04-09 DIAGNOSIS — M869 Osteomyelitis, unspecified: Principal | ICD-10-CM

## 2016-04-09 DIAGNOSIS — E11621 Type 2 diabetes mellitus with foot ulcer: Secondary | ICD-10-CM | POA: Diagnosis not present

## 2016-04-09 DIAGNOSIS — L97529 Non-pressure chronic ulcer of other part of left foot with unspecified severity: Secondary | ICD-10-CM | POA: Diagnosis not present

## 2016-04-09 DIAGNOSIS — M898X7 Other specified disorders of bone, ankle and foot: Secondary | ICD-10-CM | POA: Insufficient documentation

## 2016-04-11 ENCOUNTER — Other Ambulatory Visit (HOSPITAL_COMMUNITY): Payer: Medicare Other

## 2016-04-15 ENCOUNTER — Encounter (HOSPITAL_COMMUNITY): Payer: Self-pay | Admitting: Emergency Medicine

## 2016-04-15 ENCOUNTER — Inpatient Hospital Stay (HOSPITAL_COMMUNITY)
Admission: EM | Admit: 2016-04-15 | Discharge: 2016-04-17 | DRG: 617 | Disposition: A | Discharge status: Home / Self Care | Payer: Medicare Other | Attending: Internal Medicine | Admitting: Internal Medicine

## 2016-04-15 ENCOUNTER — Ambulatory Visit (INDEPENDENT_AMBULATORY_CARE_PROVIDER_SITE_OTHER): Payer: Medicare Other | Admitting: Podiatry

## 2016-04-15 DIAGNOSIS — E1140 Type 2 diabetes mellitus with diabetic neuropathy, unspecified: Secondary | ICD-10-CM | POA: Diagnosis present

## 2016-04-15 DIAGNOSIS — E11628 Type 2 diabetes mellitus with other skin complications: Secondary | ICD-10-CM | POA: Diagnosis present

## 2016-04-15 DIAGNOSIS — E0843 Diabetes mellitus due to underlying condition with diabetic autonomic (poly)neuropathy: Secondary | ICD-10-CM

## 2016-04-15 DIAGNOSIS — I120 Hypertensive chronic kidney disease with stage 5 chronic kidney disease or end stage renal disease: Secondary | ICD-10-CM | POA: Diagnosis present

## 2016-04-15 DIAGNOSIS — E662 Morbid (severe) obesity with alveolar hypoventilation: Secondary | ICD-10-CM | POA: Diagnosis present

## 2016-04-15 DIAGNOSIS — E11621 Type 2 diabetes mellitus with foot ulcer: Secondary | ICD-10-CM | POA: Diagnosis present

## 2016-04-15 DIAGNOSIS — M869 Osteomyelitis, unspecified: Secondary | ICD-10-CM | POA: Diagnosis present

## 2016-04-15 DIAGNOSIS — Z803 Family history of malignant neoplasm of breast: Secondary | ICD-10-CM

## 2016-04-15 DIAGNOSIS — Z955 Presence of coronary angioplasty implant and graft: Secondary | ICD-10-CM

## 2016-04-15 DIAGNOSIS — L03032 Cellulitis of left toe: Secondary | ICD-10-CM

## 2016-04-15 DIAGNOSIS — Z7682 Awaiting organ transplant status: Secondary | ICD-10-CM | POA: Diagnosis not present

## 2016-04-15 DIAGNOSIS — G4733 Obstructive sleep apnea (adult) (pediatric): Secondary | ICD-10-CM | POA: Diagnosis present

## 2016-04-15 DIAGNOSIS — E1122 Type 2 diabetes mellitus with diabetic chronic kidney disease: Secondary | ICD-10-CM | POA: Diagnosis present

## 2016-04-15 DIAGNOSIS — Z89511 Acquired absence of right leg below knee: Secondary | ICD-10-CM

## 2016-04-15 DIAGNOSIS — L02612 Cutaneous abscess of left foot: Secondary | ICD-10-CM | POA: Diagnosis present

## 2016-04-15 DIAGNOSIS — Z6837 Body mass index (BMI) 37.0-37.9, adult: Secondary | ICD-10-CM

## 2016-04-15 DIAGNOSIS — E8889 Other specified metabolic disorders: Secondary | ICD-10-CM | POA: Diagnosis present

## 2016-04-15 DIAGNOSIS — Z992 Dependence on renal dialysis: Secondary | ICD-10-CM

## 2016-04-15 DIAGNOSIS — Z794 Long term (current) use of insulin: Secondary | ICD-10-CM

## 2016-04-15 DIAGNOSIS — M86672 Other chronic osteomyelitis, left ankle and foot: Secondary | ICD-10-CM | POA: Diagnosis present

## 2016-04-15 DIAGNOSIS — I10 Essential (primary) hypertension: Secondary | ICD-10-CM | POA: Diagnosis present

## 2016-04-15 DIAGNOSIS — D631 Anemia in chronic kidney disease: Secondary | ICD-10-CM | POA: Diagnosis present

## 2016-04-15 DIAGNOSIS — E1169 Type 2 diabetes mellitus with other specified complication: Principal | ICD-10-CM | POA: Diagnosis present

## 2016-04-15 DIAGNOSIS — N186 End stage renal disease: Secondary | ICD-10-CM

## 2016-04-15 DIAGNOSIS — L97501 Non-pressure chronic ulcer of other part of unspecified foot limited to breakdown of skin: Secondary | ICD-10-CM | POA: Diagnosis not present

## 2016-04-15 DIAGNOSIS — L089 Local infection of the skin and subcutaneous tissue, unspecified: Secondary | ICD-10-CM | POA: Diagnosis present

## 2016-04-15 DIAGNOSIS — N2581 Secondary hyperparathyroidism of renal origin: Secondary | ICD-10-CM | POA: Diagnosis present

## 2016-04-15 DIAGNOSIS — M89372 Hypertrophy of bone, left ankle and foot: Secondary | ICD-10-CM | POA: Diagnosis not present

## 2016-04-15 DIAGNOSIS — E08621 Diabetes mellitus due to underlying condition with foot ulcer: Secondary | ICD-10-CM

## 2016-04-15 DIAGNOSIS — Z841 Family history of disorders of kidney and ureter: Secondary | ICD-10-CM

## 2016-04-15 DIAGNOSIS — L97529 Non-pressure chronic ulcer of other part of left foot with unspecified severity: Secondary | ICD-10-CM | POA: Diagnosis present

## 2016-04-15 DIAGNOSIS — M1990 Unspecified osteoarthritis, unspecified site: Secondary | ICD-10-CM | POA: Diagnosis present

## 2016-04-15 DIAGNOSIS — E1151 Type 2 diabetes mellitus with diabetic peripheral angiopathy without gangrene: Secondary | ICD-10-CM | POA: Diagnosis present

## 2016-04-15 DIAGNOSIS — N189 Chronic kidney disease, unspecified: Secondary | ICD-10-CM | POA: Diagnosis present

## 2016-04-15 DIAGNOSIS — I70245 Atherosclerosis of native arteries of left leg with ulceration of other part of foot: Secondary | ICD-10-CM

## 2016-04-15 DIAGNOSIS — E1143 Type 2 diabetes mellitus with diabetic autonomic (poly)neuropathy: Secondary | ICD-10-CM

## 2016-04-15 DIAGNOSIS — I2510 Atherosclerotic heart disease of native coronary artery without angina pectoris: Secondary | ICD-10-CM | POA: Diagnosis present

## 2016-04-15 DIAGNOSIS — E119 Type 2 diabetes mellitus without complications: Secondary | ICD-10-CM

## 2016-04-15 DIAGNOSIS — J4540 Moderate persistent asthma, uncomplicated: Secondary | ICD-10-CM | POA: Diagnosis present

## 2016-04-15 DIAGNOSIS — Z833 Family history of diabetes mellitus: Secondary | ICD-10-CM

## 2016-04-15 DIAGNOSIS — L97509 Non-pressure chronic ulcer of other part of unspecified foot with unspecified severity: Secondary | ICD-10-CM

## 2016-04-15 LAB — CBC WITH DIFFERENTIAL/PLATELET
Basophils Absolute: 0 10*3/uL (ref 0.0–0.1)
Basophils Relative: 0 %
Eosinophils Absolute: 0.2 10*3/uL (ref 0.0–0.7)
Eosinophils Relative: 3 %
HCT: 34.6 % — ABNORMAL LOW (ref 39.0–52.0)
Hemoglobin: 10.2 g/dL — ABNORMAL LOW (ref 13.0–17.0)
Lymphocytes Relative: 18 %
Lymphs Abs: 1.4 10*3/uL (ref 0.7–4.0)
MCH: 27.1 pg (ref 26.0–34.0)
MCHC: 29.5 g/dL — ABNORMAL LOW (ref 30.0–36.0)
MCV: 91.8 fL (ref 78.0–100.0)
Monocytes Absolute: 0.6 10*3/uL (ref 0.1–1.0)
Monocytes Relative: 8 %
Neutro Abs: 5.6 10*3/uL (ref 1.7–7.7)
Neutrophils Relative %: 71 %
Platelets: 236 10*3/uL (ref 150–400)
RBC: 3.77 MIL/uL — ABNORMAL LOW (ref 4.22–5.81)
RDW: 16.7 % — ABNORMAL HIGH (ref 11.5–15.5)
WBC: 7.8 10*3/uL (ref 4.0–10.5)

## 2016-04-15 LAB — COMPREHENSIVE METABOLIC PANEL
ALT: 12 U/L — ABNORMAL LOW (ref 17–63)
AST: 15 U/L (ref 15–41)
Albumin: 3 g/dL — ABNORMAL LOW (ref 3.5–5.0)
Alkaline Phosphatase: 56 U/L (ref 38–126)
Anion gap: 15 (ref 5–15)
BUN: 26 mg/dL — ABNORMAL HIGH (ref 6–20)
CO2: 28 mmol/L (ref 22–32)
Calcium: 8.9 mg/dL (ref 8.9–10.3)
Chloride: 95 mmol/L — ABNORMAL LOW (ref 101–111)
Creatinine, Ser: 7.28 mg/dL — ABNORMAL HIGH (ref 0.61–1.24)
GFR calc Af Amer: 9 mL/min — ABNORMAL LOW (ref >60)
GFR calc non Af Amer: 8 mL/min — ABNORMAL LOW (ref >60)
Glucose, Bld: 202 mg/dL — ABNORMAL HIGH (ref 65–99)
Potassium: 4.7 mmol/L (ref 3.5–5.1)
Sodium: 138 mmol/L (ref 135–145)
Total Bilirubin: 0.4 mg/dL (ref 0.3–1.2)
Total Protein: 8 g/dL (ref 6.5–8.1)

## 2016-04-15 LAB — I-STAT CG4 LACTIC ACID, ED
Lactic Acid, Venous: 1.61 mmol/L (ref 0.5–1.9)
Lactic Acid, Venous: 2.34 mmol/L (ref 0.5–1.9)

## 2016-04-15 LAB — C-REACTIVE PROTEIN: CRP: 13.3 mg/dL — ABNORMAL HIGH (ref <1.0)

## 2016-04-15 LAB — SEDIMENTATION RATE: Sed Rate: 74 mm/hr — ABNORMAL HIGH (ref 0–16)

## 2016-04-15 NOTE — ED Provider Notes (Signed)
[place] DEPT Provider Note   CSN: [id number] Arrival date & time: 04/15/16  1711  By signing my name below, I, Soijett Blue, attest that this documentation has been prepared under the direction and in the presence of Leo Grosser, MD. Electronically Signed: Soijett Blue, ED Scribe. 04/15/16. 8:40 PM.  History   Chief Complaint Chief Complaint  Patient presents with  . Foot Injury    HPI Tim Becker is a 51 y.o. male with a PMHx of DM, CKD, HTN, who presents to the Emergency Department complaining of left foot injury occurring 3 months ago. Pt notes that he has an ulcer to his left great toe that has damaged the bone. Pt states that he spoke with Dr. Daylene Katayama who informed him that he will need to come into the ED for admission and amputation of his left great toe. Pt states that he has completed wound care and hyperbaric for his left great toe ulcer. Pt reports that he had an MRI and xray completed of his left foot. Pt reports that he completes dialysis every MWF. Pt is having associated symptoms of intermittent fever with the highest being 101.5. He has tried 1 gram vancomycin three times a week with his last dose being today with no relief of his symptoms. He denies chills, swelling, and any other symptoms.   The history is provided by the patient. No language interpreter was used.    Past Medical History:  Diagnosis Date  . Anemia   . Arthritis   . Cancer [facility])    Renal Tumor  . CKD (chronic kidney disease)   . Coronary artery disease   . Diabetes ([place])   . DM (diabetes mellitus) with complications ([place])   . ESRD (end stage renal disease) ([place])    Pre- dialysis  . Hypertension   . Left kidney mass   . Neuropathy ([place])   . Osteomyelitis, chronic, ankle or foot ([place])   . PONV (postoperative nausea and vomiting)   . Renal insufficiency    Patient is on Dialysis and receives M,W and F.  . S/P BKA (below knee amputation) [facility]) June 2016   Right , [place], [place]    . Sleep apnea    CPAP  . Thyroid disease     Patient Active Problem List   Diagnosis Date Noted  . Left renal mass 07/11/2015  . Biliary calculi 09/14/2014  . Hemodialysis-associated hypotension 09/13/2014  . H/O amputation of leg through tibia and fibula ([place]) 09/08/2014  . Anemia associated with chronic renal failure 09/06/2014  . Type 2 diabetes mellitus with diabetic neuropathy ([place]) 08/07/2014  . Essential hypertension 08/07/2014  . Obesity 08/07/2014  . Hypothyroidism 08/07/2014  . Osteomyelitis of ankle or foot, right, acute ([place]) 08/07/2014  . External hemorrhoid 06/04/2014  . Kidney lump 04/06/2014  . Obstructive apnea 04/06/2014  . Diabetes mellitus ([place]) 03/23/2014  . Patient awaiting renal transplant 03/23/2014  . Asthma, moderate persistent 10/28/2013  . Anemia due to blood loss 09/14/2013  . Morbid obesity ([place]) 09/11/2013  . Morbid (severe) obesity due to excess calories ([place]) 09/11/2013  . Chronic kidney disease, stage IV (severe) ([place]) 09/10/2013  . Type 2 diabetes mellitus ([place]) 09/10/2013  . Diabetic foot infection ([place]) 09/10/2013  . Mild persistent asthma with acute exacerbation 09/10/2013  . Abnormal ECG 05/07/2013  . CAD in native artery 05/07/2013    Past Surgical History:  Procedure Laterality Date  . BASCILIC VEIN TRANSPOSITION Left 02/17/2015   Procedure: [place];  Surgeon: Katha Cabal, MD;  Location: [facility] ORS;  Service: Vascular;  Laterality: Left;  . BELOW KNEE LEG AMPUTATION Right   . CORONARY ANGIOPLASTY WITH STENT PLACEMENT    . EYE SURGERY    . FOOT SURGERY     Multiple R foot surgery for infection and charcot foot  . JOINT REPLACEMENT    . LAPAROSCOPIC NEPHRECTOMY, HAND ASSISTED Left 07/11/2015   Procedure: HAND ASSISTED LAPAROSCOPIC NEPHRECTOMY;  Surgeon: Hollice Espy, MD;  Location: [facility] ORS;  Service: Urology;  Laterality: Left;  . PERIPHERAL VASCULAR CATHETERIZATION Left 12/06/2014   Procedure: A/V  Shuntogram/Fistulagram;  Surgeon: Katha Cabal, MD;  Location: [place] [facility];  Service: Cardiovascular;  Laterality: Left;  . PERIPHERAL VASCULAR CATHETERIZATION Left 12/06/2014   Procedure: A/V Shunt Intervention;  Surgeon: Katha Cabal, MD;  Location: [place] [facility];  Service: Cardiovascular;  Laterality: Left;  . TONSILLECTOMY         Home Medications    Prior to Admission medications   Medication Sig Start Date End Date Taking? Authorizing Provider  calcium acetate (PHOSLO) 667 MG capsule Take 2,001 mg by mouth 3 (three) times daily with meals. 667 mg with snacks 05/29/15   Historical Provider, MD  carvedilol (COREG) 12.5 MG tablet Take 12.5 mg by mouth 2 (two) times daily with a meal.    Historical Provider, MD  insulin lispro (HUMALOG) 100 UNIT/ML injection Inject into the skin 3 (three) times daily before meals.    Historical Provider, MD  LANTUS SOLOSTAR 100 UNIT/ML Solostar Pen Inject 80 Units into the skin daily.  07/12/14   Historical Provider, MD    Family History Family History  Problem Relation Age of Onset  . Diabetes Mother   . Kidney disease Mother   . Breast cancer Mother     Social History Social History  Substance Use Topics  . Smoking status: Never Smoker  . Smokeless tobacco: Never Used  . Alcohol use No     Allergies   Patient has no known allergies.   Review of Systems Review of Systems  Musculoskeletal: Positive for arthralgias (left foot). Negative for joint swelling.  Skin: Negative for color change.       +Ulcer to left great toe     Physical Exam Updated Vital Signs BP 144/72   Pulse 86   Temp 99.2 F (37.3 C) (Oral)   Resp 10   SpO2 97%   Physical Exam  Constitutional: He is oriented to person, place, and time. He appears well-developed and well-nourished. No distress.  HENT:  Head: Normocephalic and atraumatic.  Eyes: EOM are normal.  Neck: Neck supple.  Cardiovascular: Normal rate.   Pulmonary/Chest:  Effort normal. No respiratory distress.  Abdominal: He exhibits no distension.  Musculoskeletal: Normal range of motion.  See picture below  Neurological: He is alert and oriented to person, place, and time.  Skin: Skin is warm and dry.  Psychiatric: He has a normal mood and affect. His behavior is normal.  Nursing note and vitals reviewed.        ED Treatments / Results  DIAGNOSTIC STUDIES: Oxygen Saturation is 97% on RA, nl by my interpretation.    COORDINATION OF CARE: 8:36 PM Discussed treatment plan with pt at bedside which includes labs and pt agreed to plan.  8:41 PM- Consult with Podiatrist, Dr. Amalia Hailey who recommends admission to hospitalist.  8:50 PM- Consult with hospitalist, who will admit the patient.  Labs (all labs ordered are listed, but  only abnormal results are displayed) Labs Reviewed  CBC WITH DIFFERENTIAL/PLATELET - Abnormal; Notable for the following:       Result Value   RBC 3.77 (*)    Hemoglobin 10.2 (*)    HCT 34.6 (*)    MCHC 29.5 (*)    RDW 16.7 (*)    All other components within normal limits  COMPREHENSIVE METABOLIC PANEL - Abnormal; Notable for the following:    Chloride 95 (*)    Glucose, Bld 202 (*)    BUN 26 (*)    Creatinine, Ser 7.28 (*)    Albumin 3.0 (*)    ALT 12 (*)    GFR calc non Af Amer 8 (*)    GFR calc Af Amer 9 (*)    All other components within normal limits  SEDIMENTATION RATE - Abnormal; Notable for the following:    Sed Rate 74 (*)    All other components within normal limits  C-REACTIVE PROTEIN - Abnormal; Notable for the following:    CRP 13.3 (*)    All other components within normal limits  I-STAT CG4 LACTIC ACID, ED - Abnormal; Notable for the following:    Lactic Acid, Venous 2.34 (*)    All other components within normal limits  CULTURE, BLOOD (ROUTINE X 2)  CULTURE, BLOOD (ROUTINE X 2)  I-STAT CG4 LACTIC ACID, ED    EKG  EKG Interpretation None       Radiology No results  found.  Procedures Procedures (including critical care time)  Medications Ordered in ED Medications - No data to display   Initial Impression / Assessment and Plan / ED Course  I have reviewed the triage vital signs and the nursing notes.  Pertinent labs & imaging results that were available during my care of the patient were reviewed by me and considered in my medical decision making (see chart for details).  Clinical Course      Labs: I-STAT lactic acid  Imaging: CBC  Consults: [facility]  Therapeutics: CMP  Discharge Meds:   Assessment/Plan: 51 year old male with cellulitis and osteomyelitis of his left great toe presents today at the request of Dr. Amalia Hailey for hospital admission and subsequent amputation tomorrow.  I spoke with Dr. Amalia Hailey on the phone, he requested [facility] hospitalist admit for anticipated indication tomorrow late afternoon.  Patient currently receiving vancomycin as an outpatient was recently seen think a mycin today at dialysis.  Patient reports he's had fevers for the last month, no change in his baseline status.  I spoke with [facility] hospitalist who agreed for admission.  Final Clinical Impressions(s) / ED Diagnoses   Final diagnoses:  None    New Prescriptions New Prescriptions   No medications on file   I personally performed the services described in this documentation, which was scribed in my presence. The recorded information has been reviewed and is accurate.    Okey Regal, PA-C 04/15/16 2139    Leo Grosser, MD 04/16/16 [phone]

## 2016-04-15 NOTE — H&P (Addendum)
Tim Becker KGM:[id number] DOB: [birth year] DOA: 04/15/2016     PCP: Leeroy Cha, MD   Outpatient Specialists: Dr. Macarthur Critchley [phone], Nephrology [place] Patient coming from:   home Lives  With family    Chief Complaint: left great toe ulcer pain  HPI: Tim Becker is a 51 y.o. male with medical history significant of ESRD on HD M,W,F at [facility]; DM 2, right BKA    Presented with possible months patient have had all Cerner plantar aspect of the left great toe he has been followed for this by Podiatry . Patient was recommended by his podiatrist to present to emergency department with plan to undergo toe amputation tomorrow at 5 PM  patient endorsing fever and chills. No significant pain. Patient has been on antibiotics last dose of vancomycin was given with his hemodialysis this morning. Regarding pertinent Chronic problems: Been diagnosed with diabetes since 1997 on hemodialysis due to end-stage renal disease and currently on the renal transplant list. Last hemodialysis was yesterday.   IN ER:  Temp (24hrs), Avg:99.2 F (37.3 C), Min:99.2 F (37.3 C), Max:99.2 F (37.3 C)  RR 16 98% HR 87 BP 146/70 Lactic acid 2.34 WBC 7.8 hemoglobin 10.2 which is below baseline of 11 K4.7 creatinine 7.28  Sedimentation rate 74 CRP 13.3  Following Medications were ordered in ER: Medications - No data to display   ER provider discussed case with:  Dr. Ruby Cola  Hospitalist was called for admission for osteomyelitis  Review of Systems:    Pertinent positives include: Fevers, chills, fatigue,   Constitutional:  No weight loss, night sweats, weight loss  HEENT:  No headaches, Difficulty swallowing,Tooth/dental problems,Sore throat,  No sneezing, itching, ear ache, nasal congestion, post nasal drip,  Cardio-vascular:  No chest pain, Orthopnea, PND, anasarca, dizziness, palpitations.no Bilateral lower extremity swelling  GI:  No heartburn, indigestion, abdominal  pain, nausea, vomiting, diarrhea, change in bowel habits, loss of appetite, melena, blood in stool, hematemesis Resp:  no shortness of breath at rest. No dyspnea on exertion, No excess mucus, no productive cough, No non-productive cough, No coughing up of blood.No change in color of mucus.No wheezing. Skin:  no rash or lesions. No jaundice GU:  no dysuria, change in color of urine, no urgency or frequency. No straining to urinate.  No flank pain.  Musculoskeletal:  No joint pain or no joint swelling. No decreased range of motion. No back pain.  Psych:  No change in mood or affect. No depression or anxiety. No memory loss.  Neuro: no localizing neurological complaints, no tingling, no weakness, no double vision, no gait abnormality, no slurred speech, no confusion  As per HPI otherwise 10 point review of systems negative.   Past Medical History: Past Medical History:  Diagnosis Date  . Anemia   . Arthritis   . Cancer [facility])    Renal Tumor  . CKD (chronic kidney disease)   . Coronary artery disease   . Diabetes ([place])   . DM (diabetes mellitus) with complications ([place])   . ESRD (end stage renal disease) ([place])    Pre- dialysis  . Hypertension   . Left kidney mass   . Neuropathy ([place])   . Osteomyelitis, chronic, ankle or foot ([place])   . PONV (postoperative nausea and vomiting)   . Renal insufficiency    Patient is on Dialysis and receives M,W and F.  . S/P BKA (below knee amputation) [facility]) June 2016   Right , [place], [place]  .  Sleep apnea    CPAP  . Thyroid disease    Past Surgical History:  Procedure Laterality Date  . BASCILIC VEIN TRANSPOSITION Left 02/17/2015   Procedure: BASCILIC VEIN TRANSPOSITION;  Surgeon: Katha Cabal, MD;  Location: [facility] ORS;  Service: Vascular;  Laterality: Left;  . BELOW KNEE LEG AMPUTATION Right   . CORONARY ANGIOPLASTY WITH STENT PLACEMENT    . EYE SURGERY    . FOOT SURGERY     Multiple R foot surgery for infection and charcot foot  .  JOINT REPLACEMENT    . LAPAROSCOPIC NEPHRECTOMY, HAND ASSISTED Left 07/11/2015   Procedure: HAND ASSISTED LAPAROSCOPIC NEPHRECTOMY;  Surgeon: Hollice Espy, MD;  Location: [facility] ORS;  Service: Urology;  Laterality: Left;  . PERIPHERAL VASCULAR CATHETERIZATION Left 12/06/2014   Procedure: A/V Shuntogram/Fistulagram;  Surgeon: Katha Cabal, MD;  Location: [place] [facility];  Service: Cardiovascular;  Laterality: Left;  . PERIPHERAL VASCULAR CATHETERIZATION Left 12/06/2014   Procedure: A/V Shunt Intervention;  Surgeon: Katha Cabal, MD;  Location: [place] [facility];  Service: Cardiovascular;  Laterality: Left;  . TONSILLECTOMY       Social History:  Ambulatory  independently    reports that he has never smoked. He has never used smokeless tobacco. He reports that he does not drink alcohol or use drugs.  Allergies:  No Known Allergies     Family History:  Family History  Problem Relation Age of Onset  . Diabetes Mother   . Kidney disease Mother   . Breast cancer Mother     Medications: Prior to Admission medications   Medication Sig Start Date End Date Taking? Authorizing Provider  calcium acetate (PHOSLO) 667 MG capsule Take 2,001 mg by mouth 3 (three) times daily with meals. 667 mg with snacks 05/29/15  Yes Historical Provider, MD  carvedilol (COREG) 12.5 MG tablet Take 12.5 mg by mouth 2 (two) times daily with a meal.   Yes Historical Provider, MD  insulin lispro (HUMALOG) 100 UNIT/ML injection Inject 10-25 Units into the skin 3 (three) times daily before meals.    Yes Historical Provider, MD  LANTUS SOLOSTAR 100 UNIT/ML Solostar Pen Inject 80 Units into the skin daily.  07/12/14  Yes Historical Provider, MD    Physical Exam: Patient Vitals for the past 24 hrs:  BP Temp Temp src Pulse Resp SpO2  04/15/16 2145 146/70 - - 87 16 98 %  04/15/16 2130 149/73 - - 75 14 97 %  04/15/16 2115 131/63 - - 84 12 95 %  04/15/16 2045 144/72 - - 86 10 97 %  04/15/16 2030 165/83  - - 76 13 100 %  04/15/16 2015 153/76 - - 87 15 97 %  04/15/16 1947 145/69 99.2 F (37.3 C) Oral 87 16 97 %  04/15/16 1726 160/73 99.2 F (37.3 C) Oral 76 16 98 %    1. General:  in No Acute distress 2. Psychological: Alert and  Oriented 3. Head/ENT:   Moist   Mucous Membranes                          Head Non traumatic, neck supple                          Normal  Dentition 4. SKIN: normal Skin turgor,  Skin clean Dry and ulcer clean base of left great toe 5. Heart: Regular rate and rhythm no  Murmur,  Rub or gallop 6. Lungs:distant tno wheezes or crackles   7. Abdomen: Soft,  non-tender, Non distended 8. Lower extremities: no clubbing, cyanosis, or edema right leg surgically Absent 9. Neurologically Grossly intact, moving all 4 extremities equally   10. MSK: Normal range of motion   body mass index is unknown because there is no height or weight on file.  Labs on Admission:   Labs on Admission: I have personally reviewed following labs and imaging studies  CBC:  Recent Labs Lab 04/15/16 1742  WBC 7.8  NEUTROABS 5.6  HGB 10.2*  HCT 34.6*  MCV 91.8  PLT 536   Basic Metabolic Panel:  Recent Labs Lab 04/15/16 1742  NA 138  K 4.7  CL 95*  CO2 28  GLUCOSE 202*  BUN 26*  CREATININE 7.28*  CALCIUM 8.9   GFR: CrCl cannot be calculated (Unknown ideal weight.). Liver Function Tests:  Recent Labs Lab 04/15/16 1742  AST 15  ALT 12*  ALKPHOS 56  BILITOT 0.4  PROT 8.0  ALBUMIN 3.0*   No results for input(s): LIPASE, AMYLASE in the last 168 hours. No results for input(s): AMMONIA in the last 168 hours. Coagulation Profile: No results for input(s): INR, PROTIME in the last 168 hours. Cardiac Enzymes: No results for input(s): CKTOTAL, CKMB, CKMBINDEX, TROPONINI in the last 168 hours. BNP (last 3 results) No results for input(s): PROBNP in the last 8760 hours. HbA1C: No results for input(s): HGBA1C in the last 72 hours. CBG: No results for input(s): GLUCAP  in the last 168 hours. Lipid Profile: No results for input(s): CHOL, HDL, LDLCALC, TRIG, CHOLHDL, LDLDIRECT in the last 72 hours. Thyroid Function Tests: No results for input(s): TSH, T4TOTAL, FREET4, T3FREE, THYROIDAB in the last 72 hours. Anemia Panel: No results for input(s): VITAMINB12, FOLATE, FERRITIN, TIBC, IRON, RETICCTPCT in the last 72 hours. Urine analysis:    Component Value Date/Time   COLORURINE YELLOW (A) 06/27/2015 0904   APPEARANCEUR CLEAR (A) 06/27/2015 0904   LABSPEC 1.016 06/27/2015 0904   PHURINE 5.0 06/27/2015 0904   GLUCOSEU NEGATIVE 06/27/2015 0904   HGBUR NEGATIVE 06/27/2015 0904   BILIRUBINUR NEGATIVE 06/27/2015 0904   KETONESUR NEGATIVE 06/27/2015 0904   PROTEINUR 100 (A) 06/27/2015 0904   NITRITE NEGATIVE 06/27/2015 0904   LEUKOCYTESUR NEGATIVE 06/27/2015 0904   Sepsis Labs: @LABRCNTIP (procalcitonin:4,lacticidven:4) )No results found for this or any previous visit (from the past 240 hour(s)).     UA not ordered  No results found for: HGBA1C  CrCl cannot be calculated (Unknown ideal weight.).  BNP (last 3 results) No results for input(s): PROBNP in the last 8760 hours.   ECG REPORT Not obtained  There were no vitals filed for this visit.   Cultures:    Component Value Date/Time   SDES URINE, CLEAN CATCH 06/27/2015 0904   SPECREQUEST NONE 06/27/2015 0904   CULT NO GROWTH 1 DAY 06/27/2015 0904   REPTSTATUS 06/28/2015 FINAL 06/27/2015 0904     Radiological Exams on Admission: No results found.  Chart has been reviewed    Assessment/Plan  51 y.o. male with medical history significant of ESRD on HD M,W,F at [facility]; DM 2, right BKA admitted for left great toe osteomyelitis requiring amputation Present on Admission:  . Chronic osteomyelitis of toe of left great toe ([place]) -  plan to have podiatry operate in the a.m. for amputation continue with antibiotics dosed per pharmacy. Nothing by mouth post breakfast Per diabetic foot  protocol obtain ABI . Anemia associated with  chronic renal failure stable continue to monitor . Asthma, moderate persistent chronic currently stable symptomatic . CAD in native artery-  is stable continue home medications  . Diabetic foot infection ([place]) - continue with IV antibiotics appreciate pharmacy assistance . Essential hypertension - stable continue home medications   . Type 2 diabetes mellitus with diabetic neuropathy (H[facility]) - decrease dose of Lantus second to patient being nothing by mouth monitoring sliding scale  End-stage renal disease on dialysis - will notify nephrology patient has been admitted Dialysis on Wednesday  Other plan as per orders.  DVT prophylaxis:  SCD    Code Status:  FULL CODE as per patient    Family Communication:   Family not  at  Bedside    Disposition Plan:    To home once workup is complete and patient is stable                              Consults called: Dr. Ruby Cola with Podiatry aware, left msg for Nephrology   Admission status:    inpatient       Level of care           medical floor         I have spent a total of 56 min on this admission  Lakecia Deschamps 04/15/2016, 10:55 PM    [facility]  Pager [phone]   after 2 AM please page floor coverage PA If 7AM-7PM, please contact the day team taking care of the patient  [email or web address]  Password [email or web address]

## 2016-04-15 NOTE — ED Triage Notes (Addendum)
Pt sent from dr office to be admitted for amputation  Of let f big toe, to have surgery tomorrow at 5 pm, pt is dialysis pt MWF and had dialysis today sernt by  Dr Daylene Katayama

## 2016-04-16 ENCOUNTER — Encounter (HOSPITAL_COMMUNITY)
Admission: EM | Disposition: A | Discharge status: Home / Self Care | Payer: Self-pay | Source: Home / Self Care | Attending: Internal Medicine

## 2016-04-16 ENCOUNTER — Inpatient Hospital Stay (HOSPITAL_COMMUNITY): Payer: Medicare Other

## 2016-04-16 ENCOUNTER — Encounter (HOSPITAL_COMMUNITY): Payer: Self-pay

## 2016-04-16 ENCOUNTER — Inpatient Hospital Stay (HOSPITAL_COMMUNITY): Payer: Medicare Other | Admitting: Anesthesiology

## 2016-04-16 DIAGNOSIS — E11621 Type 2 diabetes mellitus with foot ulcer: Secondary | ICD-10-CM

## 2016-04-16 DIAGNOSIS — N186 End stage renal disease: Secondary | ICD-10-CM

## 2016-04-16 DIAGNOSIS — M89372 Hypertrophy of bone, left ankle and foot: Secondary | ICD-10-CM

## 2016-04-16 DIAGNOSIS — M86672 Other chronic osteomyelitis, left ankle and foot: Secondary | ICD-10-CM

## 2016-04-16 DIAGNOSIS — L97501 Non-pressure chronic ulcer of other part of unspecified foot limited to breakdown of skin: Secondary | ICD-10-CM

## 2016-04-16 DIAGNOSIS — Z992 Dependence on renal dialysis: Secondary | ICD-10-CM

## 2016-04-16 HISTORY — PX: I & D EXTREMITY: SHX5045

## 2016-04-16 LAB — COMPREHENSIVE METABOLIC PANEL
ALT: 14 U/L — ABNORMAL LOW (ref 17–63)
AST: 11 U/L — ABNORMAL LOW (ref 15–41)
Albumin: 3.1 g/dL — ABNORMAL LOW (ref 3.5–5.0)
Alkaline Phosphatase: 47 U/L (ref 38–126)
Anion gap: 12 (ref 5–15)
BUN: 31 mg/dL — ABNORMAL HIGH (ref 6–20)
CO2: 28 mmol/L (ref 22–32)
Calcium: 8.8 mg/dL — ABNORMAL LOW (ref 8.9–10.3)
Chloride: 98 mmol/L — ABNORMAL LOW (ref 101–111)
Creatinine, Ser: 8.26 mg/dL — ABNORMAL HIGH (ref 0.61–1.24)
GFR calc Af Amer: 8 mL/min — ABNORMAL LOW (ref >60)
GFR calc non Af Amer: 7 mL/min — ABNORMAL LOW (ref >60)
Glucose, Bld: 199 mg/dL — ABNORMAL HIGH (ref 65–99)
Potassium: 4.4 mmol/L (ref 3.5–5.1)
Sodium: 138 mmol/L (ref 135–145)
Total Bilirubin: 0.7 mg/dL (ref 0.3–1.2)
Total Protein: 7.1 g/dL (ref 6.5–8.1)

## 2016-04-16 LAB — CBC
HCT: 34.2 % — ABNORMAL LOW (ref 39.0–52.0)
Hemoglobin: 10.1 g/dL — ABNORMAL LOW (ref 13.0–17.0)
MCH: 27.1 pg (ref 26.0–34.0)
MCHC: 29.5 g/dL — ABNORMAL LOW (ref 30.0–36.0)
MCV: 91.7 fL (ref 78.0–100.0)
Platelets: 239 10*3/uL (ref 150–400)
RBC: 3.73 MIL/uL — ABNORMAL LOW (ref 4.22–5.81)
RDW: 16.9 % — ABNORMAL HIGH (ref 11.5–15.5)
WBC: 7.4 10*3/uL (ref 4.0–10.5)

## 2016-04-16 LAB — TSH: TSH: 4.43 u[IU]/mL (ref 0.350–4.500)

## 2016-04-16 LAB — GLUCOSE, CAPILLARY
Glucose-Capillary: 102 mg/dL — ABNORMAL HIGH (ref 65–99)
Glucose-Capillary: 104 mg/dL — ABNORMAL HIGH (ref 65–99)
Glucose-Capillary: 149 mg/dL — ABNORMAL HIGH (ref 65–99)
Glucose-Capillary: 177 mg/dL — ABNORMAL HIGH (ref 65–99)
Glucose-Capillary: 181 mg/dL — ABNORMAL HIGH (ref 65–99)
Glucose-Capillary: 203 mg/dL — ABNORMAL HIGH (ref 65–99)
Glucose-Capillary: 97 mg/dL (ref 65–99)

## 2016-04-16 LAB — SURGICAL PCR SCREEN
MRSA, PCR: POSITIVE — AB
Staphylococcus aureus: POSITIVE — AB

## 2016-04-16 LAB — PHOSPHORUS: Phosphorus: 5.5 mg/dL — ABNORMAL HIGH (ref 2.5–4.6)

## 2016-04-16 LAB — MAGNESIUM: Magnesium: 2.1 mg/dL (ref 1.7–2.4)

## 2016-04-16 SURGERY — IRRIGATION AND DEBRIDEMENT EXTREMITY
Anesthesia: General | Site: Foot | Laterality: Left

## 2016-04-16 MED ORDER — ONDANSETRON HCL 4 MG/2ML IJ SOLN: 4.0000 mg | Freq: Four times a day (QID) | INTRAMUSCULAR | Status: DC | PRN

## 2016-04-16 MED ORDER — FENTANYL CITRATE (PF) 100 MCG/2ML IJ SOLN: 25.0000 ug | INTRAMUSCULAR | Status: DC | PRN

## 2016-04-16 MED ORDER — FENTANYL CITRATE (PF) 100 MCG/2ML IJ SOLN
INTRAMUSCULAR | Status: DC | PRN
  Administered 2016-04-16 (×2): 50 ug via INTRAVENOUS

## 2016-04-16 MED ORDER — DOXERCALCIFEROL 4 MCG/2ML IV SOLN
2.0000 ug | INTRAVENOUS | Status: DC
  Filled 2016-04-16: qty 2

## 2016-04-16 MED ORDER — LIDOCAINE HCL 2 % IJ SOLN
INTRAMUSCULAR | Status: DC | PRN
  Administered 2016-04-16: 20 mL

## 2016-04-16 MED ORDER — DEXTROSE 5 % IV SOLN
1.0000 g | INTRAVENOUS | Status: DC
  Administered 2016-04-16: 1 g via INTRAVENOUS
  Filled 2016-04-16: qty 1

## 2016-04-16 MED ORDER — SODIUM CHLORIDE 0.9 % IV SOLN
INTRAVENOUS | Status: DC | PRN
  Administered 2016-04-16: 20:00:00 via INTRAVENOUS

## 2016-04-16 MED ORDER — LIDOCAINE HCL 2 % IJ SOLN
INTRAMUSCULAR | Status: AC
  Filled 2016-04-16: qty 20

## 2016-04-16 MED ORDER — INSULIN ASPART 100 UNIT/ML ~~LOC~~ SOLN
0.0000 [IU] | SUBCUTANEOUS | Status: DC
  Administered 2016-04-16: 1 [IU] via SUBCUTANEOUS
  Administered 2016-04-16 (×2): 2 [IU] via SUBCUTANEOUS
  Administered 2016-04-16: 3 [IU] via SUBCUTANEOUS
  Administered 2016-04-17: 1 [IU] via SUBCUTANEOUS
  Administered 2016-04-17 (×2): 2 [IU] via SUBCUTANEOUS

## 2016-04-16 MED ORDER — PROPOFOL 10 MG/ML IV BOLUS
INTRAVENOUS | Status: DC | PRN
  Administered 2016-04-16: 300 mg via INTRAVENOUS

## 2016-04-16 MED ORDER — BUPIVACAINE HCL (PF) 0.5 % IJ SOLN
INTRAMUSCULAR | Status: DC | PRN
  Administered 2016-04-16: 5 mL

## 2016-04-16 MED ORDER — BUPIVACAINE HCL (PF) 0.5 % IJ SOLN
INTRAMUSCULAR | Status: AC
  Filled 2016-04-16: qty 30

## 2016-04-16 MED ORDER — VANCOMYCIN HCL IN DEXTROSE 1-5 GM/200ML-% IV SOLN
1000.0000 mg | INTRAVENOUS | Status: DC
  Filled 2016-04-16: qty 200

## 2016-04-16 MED ORDER — DEXTROSE 5 % IV SOLN
2.0000 g | Freq: Once | INTRAVENOUS | Status: AC
  Administered 2016-04-16: 2 g via INTRAVENOUS
  Filled 2016-04-16: qty 2

## 2016-04-16 MED ORDER — CALCIUM ACETATE (PHOS BINDER) 667 MG PO CAPS
2001.0000 mg | ORAL_CAPSULE | Freq: Three times a day (TID) | ORAL | Status: DC
  Administered 2016-04-16: 2001 mg via ORAL
  Filled 2016-04-16 (×2): qty 3

## 2016-04-16 MED ORDER — DARBEPOETIN ALFA 200 MCG/0.4ML IJ SOSY: 200.0000 ug | PREFILLED_SYRINGE | INTRAMUSCULAR | Status: DC

## 2016-04-16 MED ORDER — ONDANSETRON HCL 4 MG PO TABS: 4.0000 mg | ORAL_TABLET | Freq: Four times a day (QID) | ORAL | Status: DC | PRN

## 2016-04-16 MED ORDER — LIDOCAINE 2% (20 MG/ML) 5 ML SYRINGE
INTRAMUSCULAR | Status: AC
  Filled 2016-04-16: qty 5

## 2016-04-16 MED ORDER — OXYCODONE HCL 5 MG/5ML PO SOLN: 5.0000 mg | Freq: Once | ORAL | Status: DC | PRN

## 2016-04-16 MED ORDER — ACETAMINOPHEN 325 MG PO TABS: 650.0000 mg | ORAL_TABLET | Freq: Four times a day (QID) | ORAL | Status: DC | PRN

## 2016-04-16 MED ORDER — CARVEDILOL 12.5 MG PO TABS
12.5000 mg | ORAL_TABLET | Freq: Two times a day (BID) | ORAL | Status: DC
  Administered 2016-04-16 – 2016-04-17 (×3): 12.5 mg via ORAL
  Filled 2016-04-16 (×3): qty 1

## 2016-04-16 MED ORDER — SODIUM CHLORIDE 0.9 % IR SOLN
Status: DC | PRN
  Administered 2016-04-16: 1000 mL
  Administered 2016-04-16: 3000 mL

## 2016-04-16 MED ORDER — CHLORHEXIDINE GLUCONATE CLOTH 2 % EX PADS: 6.0000 | MEDICATED_PAD | Freq: Every day | CUTANEOUS | Status: DC

## 2016-04-16 MED ORDER — PROPOFOL 10 MG/ML IV BOLUS
INTRAVENOUS | Status: AC
  Filled 2016-04-16: qty 20

## 2016-04-16 MED ORDER — ONDANSETRON HCL 4 MG/2ML IJ SOLN
INTRAMUSCULAR | Status: AC
  Filled 2016-04-16: qty 2

## 2016-04-16 MED ORDER — FENTANYL CITRATE (PF) 100 MCG/2ML IJ SOLN
INTRAMUSCULAR | Status: AC
  Filled 2016-04-16: qty 2

## 2016-04-16 MED ORDER — ONDANSETRON HCL 4 MG/2ML IJ SOLN
INTRAMUSCULAR | Status: DC | PRN
  Administered 2016-04-16: 4 mg via INTRAVENOUS

## 2016-04-16 MED ORDER — LIDOCAINE HCL (CARDIAC) 20 MG/ML IV SOLN
INTRAVENOUS | Status: DC | PRN
  Administered 2016-04-16: 50 mg via INTRAVENOUS

## 2016-04-16 MED ORDER — OXYCODONE HCL 5 MG PO TABS: 5.0000 mg | ORAL_TABLET | Freq: Once | ORAL | Status: DC | PRN

## 2016-04-16 MED ORDER — HYDROCODONE-ACETAMINOPHEN 5-325 MG PO TABS: 1.0000 | ORAL_TABLET | ORAL | Status: DC | PRN

## 2016-04-16 MED ORDER — PHENYLEPHRINE HCL 10 MG/ML IJ SOLN
INTRAMUSCULAR | Status: DC | PRN
  Administered 2016-04-16 (×2): 40 ug via INTRAVENOUS
  Administered 2016-04-16 (×2): 80 ug via INTRAVENOUS

## 2016-04-16 MED ORDER — ACETAMINOPHEN 650 MG RE SUPP: 650.0000 mg | Freq: Four times a day (QID) | RECTAL | Status: DC | PRN

## 2016-04-16 MED ORDER — MUPIROCIN 2 % EX OINT
1.0000 "application " | TOPICAL_OINTMENT | Freq: Two times a day (BID) | CUTANEOUS | Status: DC
  Administered 2016-04-16 – 2016-04-17 (×2): 1 via NASAL
  Filled 2016-04-16: qty 22

## 2016-04-16 MED ORDER — CALCIUM ACETATE (PHOS BINDER) 667 MG PO CAPS
667.0000 mg | ORAL_CAPSULE | ORAL | Status: DC | PRN
  Administered 2016-04-16: 667 mg via ORAL
  Filled 2016-04-16: qty 1

## 2016-04-16 MED ORDER — RENA-VITE PO TABS
1.0000 | ORAL_TABLET | Freq: Every day | ORAL | Status: DC
  Administered 2016-04-17: 1 via ORAL
  Filled 2016-04-16: qty 1

## 2016-04-16 MED ORDER — INSULIN GLARGINE 100 UNIT/ML ~~LOC~~ SOLN
40.0000 [IU] | Freq: Every day | SUBCUTANEOUS | Status: DC
  Administered 2016-04-16 – 2016-04-17 (×2): 40 [IU] via SUBCUTANEOUS
  Filled 2016-04-16 (×2): qty 0.4

## 2016-04-16 SURGICAL SUPPLY — 46 items
BANDAGE ACE 4X5 VEL STRL LF (GAUZE/BANDAGES/DRESSINGS) ×2 IMPLANT
BANDAGE ESMARK 6X9 LF (GAUZE/BANDAGES/DRESSINGS) ×1 IMPLANT
BLADE AVERAGE 25X9 (BLADE) ×2 IMPLANT
BLADE SURG 15 STRL LF DISP TIS (BLADE) ×2 IMPLANT
BLADE SURG 15 STRL SS (BLADE) ×2
BNDG COHESIVE 6X5 TAN STRL LF (GAUZE/BANDAGES/DRESSINGS) IMPLANT
BNDG ESMARK 6X9 LF (GAUZE/BANDAGES/DRESSINGS) ×2
BNDG GAUZE ELAST 4 BULKY (GAUZE/BANDAGES/DRESSINGS) ×2 IMPLANT
BOWL SMART MIX CTS (DISPOSABLE) IMPLANT
CHLORAPREP W/TINT 26ML (MISCELLANEOUS) IMPLANT
CONT SPEC 4OZ CLIKSEAL STRL BL (MISCELLANEOUS) ×2 IMPLANT
COVER SURGICAL LIGHT HANDLE (MISCELLANEOUS) ×2 IMPLANT
CUFF TOURNIQUET SINGLE 34IN LL (TOURNIQUET CUFF) ×2 IMPLANT
DRAPE U-SHAPE 47X51 STRL (DRAPES) ×2 IMPLANT
DRSG PAD ABDOMINAL 8X10 ST (GAUZE/BANDAGES/DRESSINGS) ×2 IMPLANT
ELECT REM PT RETURN 9FT ADLT (ELECTROSURGICAL) ×2
ELECTRODE REM PT RTRN 9FT ADLT (ELECTROSURGICAL) ×1 IMPLANT
GAUZE SPONGE 4X4 12PLY STRL (GAUZE/BANDAGES/DRESSINGS) ×4 IMPLANT
GAUZE XEROFORM 1X8 LF (GAUZE/BANDAGES/DRESSINGS) ×2 IMPLANT
GLOVE BIOGEL PI IND STRL 8 (GLOVE) ×1 IMPLANT
GLOVE BIOGEL PI INDICATOR 8 (GLOVE) ×1
GLOVE SURG ORTHO 8.0 STRL STRW (GLOVE) ×2 IMPLANT
GOWN STRL REUS W/ TWL LRG LVL3 (GOWN DISPOSABLE) ×2 IMPLANT
GOWN STRL REUS W/TWL LRG LVL3 (GOWN DISPOSABLE) ×2
HANDPIECE INTERPULSE COAX TIP (DISPOSABLE) ×1
KIT BASIN OR (CUSTOM PROCEDURE TRAY) ×2 IMPLANT
KIT ROOM TURNOVER OR (KITS) ×2 IMPLANT
MANIFOLD NEPTUNE II (INSTRUMENTS) ×2 IMPLANT
NEEDLE HYPO 25GX1X1/2 BEV (NEEDLE) ×2 IMPLANT
NS IRRIG 1000ML POUR BTL (IV SOLUTION) ×2 IMPLANT
PACK ORTHO EXTREMITY (CUSTOM PROCEDURE TRAY) ×2 IMPLANT
PAD ARMBOARD 7.5X6 YLW CONV (MISCELLANEOUS) ×4 IMPLANT
PADDING CAST COTTON 6X4 STRL (CAST SUPPLIES) IMPLANT
SCRUB BETADINE 4OZ XXX (MISCELLANEOUS) ×2 IMPLANT
SET HNDPC FAN SPRY TIP SCT (DISPOSABLE) ×1 IMPLANT
SOLUTION BETADINE 4OZ (MISCELLANEOUS) ×2 IMPLANT
STAPLER VISISTAT 35W (STAPLE) IMPLANT
STOCKINETTE IMPERVIOUS 9X36 MD (GAUZE/BANDAGES/DRESSINGS) IMPLANT
SUT PROLENE 3 0 PS 2 (SUTURE) ×4 IMPLANT
SWAB COLLECTION DEVICE MRSA (MISCELLANEOUS) ×2 IMPLANT
SYR CONTROL 10ML LL (SYRINGE) ×2 IMPLANT
TOWEL OR 17X24 6PK STRL BLUE (TOWEL DISPOSABLE) ×2 IMPLANT
TOWEL OR 17X26 10 PK STRL BLUE (TOWEL DISPOSABLE) ×2 IMPLANT
TUBE ANAEROBIC SPECIMEN COL (MISCELLANEOUS) ×2 IMPLANT
TUBE CONNECTING 12X1/4 (SUCTIONS) ×2 IMPLANT
YANKAUER SUCT BULB TIP NO VENT (SUCTIONS) ×2 IMPLANT

## 2016-04-16 NOTE — Transfer of Care (Signed)
Immediate Anesthesia Transfer of Care Note  Patient: Tim Becker  Procedure(s) Performed: Procedure(s): IRRIGATION AND DEBRIDEMENT EXTREMITY/GREAT TOE AMP. (Left)  Patient Location: PACU  Anesthesia Type:General  Level of Consciousness: awake, alert , oriented and patient cooperative  Airway & Oxygen Therapy: Patient Spontanous Breathing and Patient connected to nasal cannula oxygen  Post-op Assessment: Report given to RN and Post -op Vital signs reviewed and stable  Post vital signs: Reviewed and stable  Last Vitals:  Vitals:   04/16/16 1054 04/16/16 2104  BP: (!) 162/77 (!) (P) 159/79  Pulse: 77   Resp: 17 (P) 11  Temp: 36.9 C (P) 36.3 C    Last Pain:  Vitals:   04/16/16 1054  TempSrc: Oral  PainSc:          Complications: No apparent anesthesia complications and good thrill left upper arm AVGG

## 2016-04-16 NOTE — Brief Op Note (Signed)
04/15/2016 - 04/16/2016  9:02 PM  PATIENT:  Tim Becker  51 y.o. male  PRE-OPERATIVE DIAGNOSIS:  Infected left Foot  POST-OPERATIVE DIAGNOSIS:  Infected left Foot  PROCEDURE:  Procedure(s): IRRIGATION AND DEBRIDEMENT EXTREMITY/GREAT TOE AMP. (Left)  SURGEON:  Surgeon(s) and Role:    * Edrick Kins, DPM - Primary  PHYSICIAN ASSISTANT:   ASSISTANTS: none   ANESTHESIA:   local and general  EBL:  Total I/O In: 300 [I.V.:300] Out: 5 [Blood:5]  BLOOD ADMINISTERED:none  DRAINS: none   LOCAL MEDICATIONS USED:  BUPIVICAINE  and XYLOCAINE   SPECIMEN:  Source of Specimen:  Bone - first metatarsal left foot  DISPOSITION OF SPECIMEN:  PATHOLOGY  COUNTS:  YES  TOURNIQUET:   Total Tourniquet Time Documented: Thigh (Left) - 33 minutes Total: Thigh (Left) - 33 minutes   DICTATION: .Viviann Spare Dictation  PLAN OF CARE: Admit to inpatient   PATIENT DISPOSITION:  PACU - hemodynamically stable.   Delay start of Pharmacological VTE agent (>24hrs) due to surgical blood loss or risk of bleeding: not applicable  Edrick Kins, DPM [facility]  Dr. Edrick Kins, [place]                                        [place], [place] [address]                Office [phone]  Fax [phone]

## 2016-04-16 NOTE — Anesthesia Procedure Notes (Signed)
Procedure Name: LMA Insertion Date/Time: 04/16/2016 8:04 PM Performed by: Hollie Salk Z Pre-anesthesia Checklist: Patient identified, Emergency Drugs available, Suction available and Patient being monitored Patient Re-evaluated:Patient Re-evaluated prior to inductionOxygen Delivery Method: Circle System Utilized Preoxygenation: Pre-oxygenation with 100% oxygen Intubation Type: IV induction Ventilation: Mask ventilation without difficulty LMA: LMA inserted LMA Size: 4.0 Number of attempts: 1 Placement Confirmation: positive ETCO2 and breath sounds checked- equal and bilateral Tube secured with: Tape Dental Injury: Teeth and Oropharynx as per pre-operative assessment

## 2016-04-16 NOTE — Consult Note (Signed)
[place] Nurse wound consult note Reason for Consult: Consult requested for left great toe wounds.  Pt is followed by Dr Amalia Hailey of the podiatry service prior to admission and EMR indicates their team plans to take the pt to the OR today. Refer to the EMR for photos of the wounds under the media tab. Wound type: Full thickness to anterior and posterior great toe Dressing procedure/placement/frequency: Xerofrom gauze to protect from further injury.  Please refer to podiatry team for further questions. Please re-consult if further assistance is needed.  Thank-you,  Julien Girt MSN, [place], [place], [place], [place]

## 2016-04-16 NOTE — Care Management Note (Signed)
Case Management Note  Patient Details  Name: Dyllen Menning MRN: [id number] Date of Birth: [birth year]  Subjective/Objective:      CM following for progression and d/c planning.               Action/Plan: 04/16/2016 Following for d/c needs post op.   Expected Discharge Date:                  Expected Discharge Plan:  [place]  In-House Referral:  NA  Discharge planning Services  CM Consult  Post Acute Care Choice:  Durable Medical Equipment, Home Health Choice offered to:     DME Arranged:    DME Agency:     HH Arranged:    HH Agency:     Status of Service:  In process, will continue to follow  If discussed at Long Length of Stay Meetings, dates discussed:    Additional Comments:  Adron Bene, RN 04/16/2016, 1:33 PM

## 2016-04-16 NOTE — Consult Note (Signed)
   Reason for Consultation: osteomyelitis left great toe. Cellulitis left great toe.   Subjective:  Patient presents at bedside this a.m. for follow-up treatment of cellulitis with osteomyelitis to the left great toe. Patient was last seen in the office yesterday 04/15/2016. Patient was referred from wound care center for surgical management of left foot osteomyelitis. Instructed the patient yesterday in the office to present to the emergency department for admission and surgical intervention of osteomyelitis with acute degeneration of the osseous and soft tissue structures of the left great toe.    Objective/Physical Exam General: The patient is alert and oriented x3 in no acute distress.  Musculoskeletal Exam: History of below-knee amputation right lower extremity.  Cellulitis left great toes noted which is malodorous and macerated. Radiographic exam was consistent with possible myelitis with degenerative changes of the proximal phalanx left great toe.   Assessment: #1 osteomyelitis left great toe #2 abscess left foot   Plan of Care:  Patient was evaluated at bedside this a.m. patient will be nothing by mouth beginning at 9 AM this morning in preparation for surgery this evening after 5:00. Patient understands the risks and complications associated with surgery. Patient understands that if he does not perform the surgery there is a increased risk of more proximal amputation. Podiatry will follow all inpatient   Please contact me directly for any issues. [phone].  Edrick Kins, DPM [facility]  Dr. Edrick Kins, [place]                                        [place],  [address]                Office [phone]  Fax [phone]

## 2016-04-16 NOTE — Consult Note (Signed)
[facility] Renal Consultation Note  Indication for Consultation:  Management of ESRD/hemodialysis; anemia, hypertension/volume and secondary hyperparathyroidism  HPI: Tim Becker is a 50 y.o. male with ESRD  on chronic HD M[facility]) started HD  09/08/2014, hoDM/HTN/morbid obesity/OSA/ uses CPAP, PVD left foot osteo- progressed to right BKA 08/2014- at Vascular surg At [facility] stent 2008-.Now admitted for L great toe Osteomyelitis and amputation in am  Reports chronic L great  ulcer nonhealing with  fever and chills and  no significant pain.HD complaint.No current cos.      Past Medical History:  Diagnosis Date  . Anemia   . Arthritis   . Cancer [facility])    Renal Tumor  . CKD (chronic kidney disease)   . Coronary artery disease   . Diabetes ([place])   . DM (diabetes mellitus) with complications ([place])   . ESRD (end stage renal disease) ([place])    Pre- dialysis  . Hypertension   . Left kidney mass   . Neuropathy ([place])   . Osteomyelitis, chronic, ankle or foot ([place])   . PONV (postoperative nausea and vomiting)   . Renal insufficiency    Patient is on Dialysis and receives M,W and F.  . S/P BKA (below knee amputation) [facility]) June 2016   Right , [place], [place]  . Sleep apnea    CPAP  . Thyroid disease     Past Surgical History:  Procedure Laterality Date  . BASCILIC VEIN TRANSPOSITION Left 02/17/2015   Procedure: BASCILIC VEIN TRANSPOSITION;  Surgeon: Katha Cabal, MD;  Location: [facility] ORS;  Service: Vascular;  Laterality: Left;  . BELOW KNEE LEG AMPUTATION Right   . CORONARY ANGIOPLASTY WITH STENT PLACEMENT    . EYE SURGERY    . FOOT SURGERY     Multiple R foot surgery for infection and charcot foot  . JOINT REPLACEMENT    . LAPAROSCOPIC NEPHRECTOMY, HAND ASSISTED Left 07/11/2015   Procedure: HAND ASSISTED LAPAROSCOPIC NEPHRECTOMY;  Surgeon: Hollice Espy, MD;  Location: [facility] ORS;  Service: Urology;  Laterality: Left;  . PERIPHERAL VASCULAR  CATHETERIZATION Left 12/06/2014   Procedure: A/V Shuntogram/Fistulagram;  Surgeon: Katha Cabal, MD;  Location: [place] CV LAB;  Service: Cardiovascular;  Laterality: Left;  . PERIPHERAL VASCULAR CATHETERIZATION Left 12/06/2014   Procedure: A/V Shunt Intervention;  Surgeon: Katha Cabal, MD;  Location: [place] CV LAB;  Service: Cardiovascular;  Laterality: Left;  . TONSILLECTOMY        Family History  Problem Relation Age of Onset  . Diabetes Mother   . Kidney disease Mother   . Breast cancer Mother       reports that he has never smoked. He has never used smokeless tobacco. He reports that he does not drink alcohol or use drugs.  No Known Allergies  Prior to Admission medications   Medication Sig Start Date End Date Taking? Authorizing Provider  calcium acetate (PHOSLO) 667 MG capsule Take 2,001 mg by mouth 3 (three) times daily with meals. 667 mg with snacks 05/29/15  Yes Historical Provider, MD  carvedilol (COREG) 12.5 MG tablet Take 12.5 mg by mouth 2 (two) times daily with a meal.   Yes Historical Provider, MD  insulin lispro (HUMALOG) 100 UNIT/ML injection Inject 10-25 Units into the skin 3 (three) times daily before meals.    Yes Historical Provider, MD  LANTUS SOLOSTAR 100 UNIT/ML Solostar Pen Inject 80 Units into the skin daily.  07/12/14  Yes Historical Provider, MD  Continuous:   Results for orders placed or performed during the hospital encounter of 04/15/16 (from the past 48 hour(s))  CBC with Differential     Status: Abnormal   Collection Time: 04/15/16  5:42 PM  Result Value Ref Range   WBC 7.8 4.0 - 10.5 K/uL   RBC 3.77 (L) 4.22 - 5.81 MIL/uL   Hemoglobin 10.2 (L) 13.0 - 17.0 g/dL   HCT 34.6 (L) 39.0 - 52.0 %   MCV 91.8 78.0 - 100.0 fL   MCH 27.1 26.0 - 34.0 pg   MCHC 29.5 (L) 30.0 - 36.0 g/dL   RDW 16.7 (H) 11.5 - 15.5 %   Platelets 236 150 - 400 K/uL   Neutrophils Relative % 71 %   Neutro Abs 5.6 1.7 - 7.7 K/uL   Lymphocytes Relative 18 %    Lymphs Abs 1.4 0.7 - 4.0 K/uL   Monocytes Relative 8 %   Monocytes Absolute 0.6 0.1 - 1.0 K/uL   Eosinophils Relative 3 %   Eosinophils Absolute 0.2 0.0 - 0.7 K/uL   Basophils Relative 0 %   Basophils Absolute 0.0 0.0 - 0.1 K/uL  Comprehensive metabolic panel     Status: Abnormal   Collection Time: 04/15/16  5:42 PM  Result Value Ref Range   Sodium 138 135 - 145 mmol/L   Potassium 4.7 3.5 - 5.1 mmol/L   Chloride 95 (L) 101 - 111 mmol/L   CO2 28 22 - 32 mmol/L   Glucose, Bld 202 (H) 65 - 99 mg/dL   BUN 26 (H) 6 - 20 mg/dL   Creatinine, Ser 7.28 (H) 0.61 - 1.24 mg/dL   Calcium 8.9 8.9 - 10.3 mg/dL   Total Protein 8.0 6.5 - 8.1 g/dL   Albumin 3.0 (L) 3.5 - 5.0 g/dL   AST 15 15 - 41 U/L   ALT 12 (L) 17 - 63 U/L   Alkaline Phosphatase 56 38 - 126 U/L   Total Bilirubin 0.4 0.3 - 1.2 mg/dL   GFR calc non Af Amer 8 (L) >60 mL/min   GFR calc Af Amer 9 (L) >60 mL/min    Comment: (NOTE) The eGFR has been calculated using the CKD EPI equation. This calculation has not been validated in all clinical situations. eGFR's persistently <60 mL/min signify possible Chronic Kidney Disease.    Anion gap 15 5 - 15  Sedimentation rate     Status: Abnormal   Collection Time: 04/15/16  5:42 PM  Result Value Ref Range   Sed Rate 74 (H) 0 - 16 mm/hr  C-reactive protein     Status: Abnormal   Collection Time: 04/15/16  5:42 PM  Result Value Ref Range   CRP 13.3 (H) <1.0 mg/dL  I-Stat CG4 Lactic Acid, ED     Status: None   Collection Time: 04/15/16  5:54 PM  Result Value Ref Range   Lactic Acid, Venous 1.61 0.5 - 1.9 mmol/L  I-Stat CG4 Lactic Acid, ED     Status: Abnormal   Collection Time: 04/15/16  8:19 PM  Result Value Ref Range   Lactic Acid, Venous 2.34 (HH) 0.5 - 1.9 mmol/L   Comment NOTIFIED PHYSICIAN   Glucose, capillary     Status: Abnormal   Collection Time: 04/16/16 12:13 AM  Result Value Ref Range   Glucose-Capillary 177 (H) 65 - 99 mg/dL  Surgical PCR screen     Status:  Abnormal   Collection Time: 04/16/16 12:31 AM  Result Value Ref Range  MRSA, PCR POSITIVE (A) NEGATIVE    Comment: RESULT CALLED TO, READ BACK BY AND VERIFIED WITH: Ted Mcalpine RN 9:45 04/16/16 ([email or web address])    Staphylococcus aureus POSITIVE (A) NEGATIVE    Comment:        The Xpert SA Assay (FDA approved for NASAL specimens in patients over 61 years of age), is one component of a comprehensive surveillance program.  Test performance has been validated by [facility] for patients greater than or equal to 4 year old. It is not intended to diagnose infection nor to guide or monitor treatment.   Magnesium     Status: None   Collection Time: 04/16/16  3:56 AM  Result Value Ref Range   Magnesium 2.1 1.7 - 2.4 mg/dL  Phosphorus     Status: Abnormal   Collection Time: 04/16/16  3:56 AM  Result Value Ref Range   Phosphorus 5.5 (H) 2.5 - 4.6 mg/dL  TSH     Status: None   Collection Time: 04/16/16  3:56 AM  Result Value Ref Range   TSH 4.430 0.350 - 4.500 uIU/mL    Comment: Performed by a 3rd Generation assay with a functional sensitivity of <=0.01 uIU/mL.  Comprehensive metabolic panel     Status: Abnormal   Collection Time: 04/16/16  3:56 AM  Result Value Ref Range   Sodium 138 135 - 145 mmol/L   Potassium 4.4 3.5 - 5.1 mmol/L   Chloride 98 (L) 101 - 111 mmol/L   CO2 28 22 - 32 mmol/L   Glucose, Bld 199 (H) 65 - 99 mg/dL   BUN 31 (H) 6 - 20 mg/dL   Creatinine, Ser 8.26 (H) 0.61 - 1.24 mg/dL   Calcium 8.8 (L) 8.9 - 10.3 mg/dL   Total Protein 7.1 6.5 - 8.1 g/dL   Albumin 3.1 (L) 3.5 - 5.0 g/dL   AST 11 (L) 15 - 41 U/L   ALT 14 (L) 17 - 63 U/L   Alkaline Phosphatase 47 38 - 126 U/L   Total Bilirubin 0.7 0.3 - 1.2 mg/dL   GFR calc non Af Amer 7 (L) >60 mL/min   GFR calc Af Amer 8 (L) >60 mL/min    Comment: (NOTE) The eGFR has been calculated using the CKD EPI equation. This calculation has not been validated in all clinical situations. eGFR's persistently <60 mL/min signify  possible Chronic Kidney Disease.    Anion gap 12 5 - 15  CBC     Status: Abnormal   Collection Time: 04/16/16  3:56 AM  Result Value Ref Range   WBC 7.4 4.0 - 10.5 K/uL   RBC 3.73 (L) 4.22 - 5.81 MIL/uL   Hemoglobin 10.1 (L) 13.0 - 17.0 g/dL   HCT 34.2 (L) 39.0 - 52.0 %   MCV 91.7 78.0 - 100.0 fL   MCH 27.1 26.0 - 34.0 pg   MCHC 29.5 (L) 30.0 - 36.0 g/dL   RDW 16.9 (H) 11.5 - 15.5 %   Platelets 239 150 - 400 K/uL  Glucose, capillary     Status: Abnormal   Collection Time: 04/16/16  4:22 AM  Result Value Ref Range   Glucose-Capillary 203 (H) 65 - 99 mg/dL  Glucose, capillary     Status: Abnormal   Collection Time: 04/16/16  8:12 AM  Result Value Ref Range   Glucose-Capillary 149 (H) 65 - 99 mg/dL    ROS: see hpi  Physical Exam: Vitals:   04/15/16 2145 04/16/16 0007  BP: 146/70 (!) 153/72  Pulse: 87 79  Resp: 16 18  Temp:  99.9 F (37.7 C)     General: alert obese BM NAD Pleasant, OX4 HEENT: Uncertain  MMM, EOMI  Neck: supple, no jvd Heart: RRR, no mur rub or gallop Lungs: CTA  Abdomen: bs pos, soft ,NT, ND Extremities: trace pedal edema Left, with Great  toe dressing dry / intact/ R BKA  Skin: no overt rash / L great toe dressing not removed  Neuro: Alert  OX4  No acute focal deficits  Dialysis Access: Pos bruit LUA AVF  Dialysis Orders: Center: [facility]  on MWF . EDW 112 kg HD Bath 2k, 2.25 ca  Time 4hr 52mn Heparin 6000 bolus / 3000 mid. Access LUA AVF     Hec 2 mcg IV/HD     mircera 1058m  q2wks  Last on 03/27/16   Other op labs hgb 11.1, ca 9.2 phos 5.4 pth 499  Assessment/Plan 1. L great Toe Osteomyelitis per admit  Plans for amputation 04/19/16  2. ESRD -  HD mwf  3. Hypertension/volume  - stable bp , no excess vol / on coreg 12.5 bid  bp ok  4. Anemia  - hgb admit hgb 10.1   Give esa  tomor hd  5. Metabolic bone disease -  hec and binder  6. Nutrition - renal carb  Mod  diet / supplement as needed/ renal vit   DaErnest HaberPA-C [id number] 0706/16/2018, 10:25 AM

## 2016-04-16 NOTE — Op Note (Signed)
04/15/2016 - 04/16/2016  9:02 PM  PATIENT:  Tim Becker  51 y.o. male  PRE-OPERATIVE DIAGNOSIS:   osteomyelitis left foot, abscess left foot  POST-OPERATIVE DIAGNOSIS:   same  PROCEDURE:  Procedure(s): 1. Partial first ray amputation left foot 2. Sesamoidectomy left foot 3. Incision and drainage, multiple, deep, complicated left foot   SURGEON:  Surgeon(s) and Role:    * Edrick Kins, DPM - Primary  PHYSICIAN ASSISTANT:   ASSISTANTS: none   ANESTHESIA:   local and general  EBL:  Total I/O In: 300 [I.V.:300] Out: 5 [Blood:5]  BLOOD ADMINISTERED:none  DRAINS: none   LOCAL MEDICATIONS USED:  BUPIVICAINE  and XYLOCAINE   SPECIMEN:  Source of Specimen:  Bone - first metatarsal left foot  DISPOSITION OF SPECIMEN:  PATHOLOGY  COUNTS:  YES  TOURNIQUET:   Total Tourniquet Time Documented: Thigh (Left) - 33 minutes Total: Thigh (Left) - 33 minutes  JUSTIFICATION FOR PROCEDURE: The patient is a 51 year old male with a history of diabetes mellitus and end-stage renal disease the presents today to the [facility] OR for surgical amputation regarding the left foot. Patient has a history of osteomyelitis with acute degenerative changes to the great toe of the left foot. Patient has been conservatively managed however there were acute changes warranting surgical amputation of the left great toe and possible metatarsal head.  The patient was told benefits as well as possible side effects the surgery. The patient consented for surgical correction. The patient consent form was reviewed all patient questions were answered. No guarantees were expressed or implied. The patient and the surgeon both signed the patient consent form with the witness present and placed in the patient's chart.   PROCEDURE IN DETAIL:   The patient was brought to the operating room placed the operating table in supine position at which time an aseptic scrub and drape were performed about  the patient's left lower extremity after anesthesia was induced as described above. The left thigh tourniquet was inflated to a pressure of 3 mg mercury and attention was directed to the left forefoot where procedure #1 commenced.  PROCEDURE #1: PARTIAL FIRST RAY AMPUTATION LEFT FOOT A fishmouth type incision was planned and made about the first metatarsal phalangeal joint left foot. Incision was carried down to the level of bone with care taken to cut clamped ligated or retracted way all small neurovascular structures traversing the incision site. The left great toe was grasped with a sharp towel clamp and all soft tissue structures surrounding the first metatarsal phalangeal joint were released and the toe was removed in toto and placing sterile specimen container and sent to pathology.  Intraoperative evaluation of the head of the first metatarsal indicated that there were degenerative changes consistent with osteomyelitis. Intraoperative decision was made to resect the head of the first metatarsal. Additional dissection was utilized to free the head from surrounding tissue at which time a sagittal blade was utilized to resect the head of the first metatarsal in toto. The osteotomy was performed in a dorsal distal to proximal lateral orientation slight twist. The head was removed in toto and placing sterile specimen container and sent to pathology.  PROCEDURE #2: SESMOIDECTOMY LEFT FOOT Intraoperative evaluation also displayed degenerative changes of the sesamoidal apparatus consistent with osteomyelitis. Intraoperative decision was made to resect the sesamoid apparatus in toto. #15 blade was utilized to reflect all soft tissue structures surrounding the sesamoid apparatus. All visible tendons including the EHL and FHL of the left foot  were distracted distally and a tenotomy was performed as far proximal as could be visualized. The sesamoid apparatus was removed in toto and placing sterile specimen  container and sent to pathology.  PROCEDURE #3: INCISION AND DRAINAGE LEFT FOOT Soft tissue dissection was utilized using a blunt curved mosquito and blunt dissection was utilized to ensure that there were no remaining abscesses within the soft tissue structures of the left forefoot. All purulent drainage was expressed and copious irrigation was performed using pulse lavage and 3 L of normal saline. Deep wound cultures were then performed and sent to pathology for cultures and sensitivity.  The wound was then dried in preparation for routine layered soft tissue closure. 3-0 Prolene suture was utilized to reapproximate superficial skin edges. The left thigh tourniquet which was used for hemostasis was deflated. All normal neurovascular responses including pink color and warmth returned all the digits of the patient's left lower extremity.  The patient was then transferred from the operating room to the recovery room having tolerated the procedure and anesthesia well. All vital signs are stable. After preceding recovery room patient was readmitted to his room with prescriptions for adequate analgesic care for in hospital use.  The patient should be minimally weightbearing to the left lower extremity in a postoperative shoe. Upon discharge patient will follow up in the office. Patient is instructed to keep the dressings clean dry and intact until he is to follow-up with surgeon Dr. Daylene Katayama the office next week. Recommend IV antibiotic regimen to be administered in dialysis outpatient. From a surgical standpoint, the patient is okay to be discharged.  PLAN OF CARE: Admit to inpatient   PATIENT DISPOSITION:  PACU - hemodynamically stable.   Delay start of Pharmacological VTE agent (>24hrs) due to surgical blood loss or risk of bleeding: not applicable  Edrick Kins, DPM [facility]  Dr. Edrick Kins, [address]                                               [place], [place] [address]                Office [phone]  Fax [phone]

## 2016-04-16 NOTE — Progress Notes (Signed)
Biomed staff came and checked the patient's CPAP, which he bought from home and using it.

## 2016-04-16 NOTE — Progress Notes (Signed)
Pharmacy Antibiotic Note  Tim Becker is a 51 y.o. male admitted on 04/15/2016 with left great toe osteomyelitis requiring amputation.  Pharmacy has been consulted for vancomycin and cefepime dosing.  Pt has been on abx as outpatient for MRSA osteo.  Pt states that he has been on vanc 1g during last hour of each HD w/ last session 1/15.  Plan: Continue vanc 1g after each HD for goal pre-HD level 15-25; random vanc level 1/17 prior to HD. Start cefepime 2g now and 1g Q24H.   Temp (24hrs), Avg:99.2 F (37.3 C), Min:99.2 F (37.3 C), Max:99.2 F (37.3 C)   Recent Labs Lab 04/15/16 1742 04/15/16 1754 04/15/16 2019  WBC 7.8  --   --   CREATININE 7.28*  --   --   LATICACIDVEN  --  1.61 2.34*     No Known Allergies   Thank you for allowing pharmacy to be a part of this patient's care.  Wynona Neat, PharmD, BCPS  04/16/2016 12:02 AM

## 2016-04-16 NOTE — Progress Notes (Signed)
Inpatient Diabetes Program Recommendations  AACE/ADA: New Consensus Statement on Inpatient Glycemic Control (2015)  Target Ranges:  Prepandial:   less than 140 mg/dL      Peak postprandial:   less than 180 mg/dL (1-2 hours)      Critically ill patients:  140 - 180 mg/dL   Lab Results  Component Value Date   GLUCAP 149 (H) 04/16/2016    Review of Glycemic Control Results for Tim Becker, Tim Becker (MRN [id number]) as of 04/16/2016 10:30  Ref. Range 04/16/2016 00:13 04/16/2016 04:22 04/16/2016 08:12  Glucose-Capillary Latest Ref Range: 65 - 99 mg/dL 177 (H) 203 (H) 149 (H)   Diabetes history: DM2 Outpatient Diabetes medications: Lantus 80 units + Humalog 10-25 units tid ac meals Current orders for Inpatient glycemic control: Lantus 40 + Novolog correction 0-9 units q 4 hrs.  Inpatient Diabetes Program Recommendations:  Noted A1c pending. Will follow while hospitalized.  Thank you, Nani Gasser. Kathey Simer, RN, MSN, CDE Inpatient Glycemic Control Team Team Pager [phone] (8am-5pm) 04/16/2016 10:36 AM

## 2016-04-16 NOTE — Progress Notes (Signed)
PROGRESS NOTE    Tim Becker  ZOX:[id number] DOB: [birth year] DOA: 04/15/2016  PCP: Leeroy Cha, MD   Brief Narrative:  Tim Becker is a 51 y.o. male with medical history significant of ESRD on HD M,W,F at [facility]; DM 2, right BKA who is admitted for left great toe amputation planned by his podiatrist.   Subjective: No complaints.   Assessment & Plan:   Chronic osteomyelitis of toe of left great toe  -  plan to have podiatry operate in the a.m. for amputation - continue with Vanc/ Cefepime dosed per pharmacy   Anemia associated with chronic renal failure -  stable continue to monitor  Asthma, moderate persistent chronic - currently stable symptomatic  CAD in native artery -  is stable continue home medications    Diabetic foot infection  - continue with IV antibiotics appreciate pharmacy assistance  Essential hypertension  - stable continue home medications    Type 2 diabetes mellitus with diabetic neuropathy - - decrease dose of Lantus second to patient being nothing by mouth -  monitoring on sliding scale   End-stage renal disease on dialysis  - nephrology notified  patient has been admitted Dialysis on Wednesday   DVT prophylaxis: will need to start after surgery Code Status: full code Family Communication:   Disposition Plan: follow on med/surg Consultants:   nephrology  Procedures:    Antimicrobials:  Anti-infectives    Start     Dose/Rate Route Frequency Ordered Stop   04/17/16 1200  vancomycin (VANCOCIN) IVPB 1000 mg/200 mL premix     1,000 mg 200 mL/hr over 60 Minutes Intravenous Every M-W-F (Hemodialysis) 04/16/16 0036     04/17/16 0000  ceFEPIme (MAXIPIME) 1 g in dextrose 5 % 50 mL IVPB     1 g 100 mL/hr over 30 Minutes Intravenous Every 24 hours 04/16/16 0036     04/16/16 0030  ceFEPIme (MAXIPIME) 2 g in dextrose 5 % 50 mL IVPB     2 g 100 mL/hr over 30 Minutes Intravenous  Once 04/16/16 0023 04/16/16 0135        Objective: Vitals:   04/15/16 2130 04/15/16 2145 04/16/16 0007 04/16/16 1054  BP: 149/73 146/70 (!) 153/72 (!) 162/77  Pulse: 75 87 79 77  Resp: 14 16 18 17   Temp:   99.9 F (37.7 C) 98.5 F (36.9 C)  TempSrc:   Oral Oral  SpO2: 97% 98% 97% 97%  Weight:   115.8 kg (255 lb 3.2 oz)   Height:   5\' 8"  (1.727 m)     Intake/Output Summary (Last 24 hours) at 04/16/16 1252 Last data filed at 04/16/16 1153  Gross per 24 hour  Intake              170 ml  Output               50 ml  Net              120 ml   Filed Weights   04/16/16 0007  Weight: 115.8 kg (255 lb 3.2 oz)    Examination: General exam: Appears comfortable  HEENT: PERRLA, oral mucosa moist, no sclera icterus or thrush Respiratory system: Clear to auscultation. Respiratory effort normal. Cardiovascular system: S1 & S2 heard, RRR.  No murmurs  Gastrointestinal system: Abdomen soft, non-tender, nondistended. Normal bowel sound. No organomegaly Central nervous system: Alert and oriented. No focal neurological deficits. Extremities: No cyanosis, clubbing or edema- right BKA Skin: necrotic left 1st toe Psychiatry:  Mood & affect appropriate.     Data Reviewed: I have personally reviewed following labs and imaging studies  CBC:  Recent Labs Lab 04/15/16 1742 04/16/16 0356  WBC 7.8 7.4  NEUTROABS 5.6  --   HGB 10.2* 10.1*  HCT 34.6* 34.2*  MCV 91.8 91.7  PLT 236 735   Basic Metabolic Panel:  Recent Labs Lab 04/15/16 1742 04/16/16 0356  NA 138 138  K 4.7 4.4  CL 95* 98*  CO2 28 28  GLUCOSE 202* 199*  BUN 26* 31*  CREATININE 7.28* 8.26*  CALCIUM 8.9 8.8*  MG  --  2.1  PHOS  --  5.5*   GFR: Estimated Creatinine Clearance: 13.2 mL/min (by C-G formula based on SCr of 8.26 mg/dL (H)). Liver Function Tests:  Recent Labs Lab 04/15/16 1742 04/16/16 0356  AST 15 11*  ALT 12* 14*  ALKPHOS 56 47  BILITOT 0.4 0.7  PROT 8.0 7.1  ALBUMIN 3.0* 3.1*   No results for input(s): LIPASE, AMYLASE in  the last 168 hours. No results for input(s): AMMONIA in the last 168 hours. Coagulation Profile: No results for input(s): INR, PROTIME in the last 168 hours. Cardiac Enzymes: No results for input(s): CKTOTAL, CKMB, CKMBINDEX, TROPONINI in the last 168 hours. BNP (last 3 results) No results for input(s): PROBNP in the last 8760 hours. HbA1C: No results for input(s): HGBA1C in the last 72 hours. CBG:  Recent Labs Lab 04/16/16 0013 04/16/16 0422 04/16/16 0812 04/16/16 1153  GLUCAP 177* 203* 149* 181*   Lipid Profile: No results for input(s): CHOL, HDL, LDLCALC, TRIG, CHOLHDL, LDLDIRECT in the last 72 hours. Thyroid Function Tests:  Recent Labs  04/16/16 0356  TSH 4.430   Anemia Panel: No results for input(s): VITAMINB12, FOLATE, FERRITIN, TIBC, IRON, RETICCTPCT in the last 72 hours. Urine analysis:    Component Value Date/Time   COLORURINE YELLOW (A) 06/27/2015 0904   APPEARANCEUR CLEAR (A) 06/27/2015 0904   LABSPEC 1.016 06/27/2015 0904   PHURINE 5.0 06/27/2015 0904   GLUCOSEU NEGATIVE 06/27/2015 0904   HGBUR NEGATIVE 06/27/2015 0904   BILIRUBINUR NEGATIVE 06/27/2015 0904   KETONESUR NEGATIVE 06/27/2015 0904   PROTEINUR 100 (A) 06/27/2015 0904   NITRITE NEGATIVE 06/27/2015 0904   LEUKOCYTESUR NEGATIVE 06/27/2015 0904   Sepsis Labs: @LABRCNTIP (procalcitonin:4,lacticidven:4) ) Recent Results (from the past 240 hour(s))  Surgical PCR screen     Status: Abnormal   Collection Time: 04/16/16 12:31 AM  Result Value Ref Range Status   MRSA, PCR POSITIVE (A) NEGATIVE Final    Comment: RESULT CALLED TO, READ BACK BY AND VERIFIED WITH: Ted Mcalpine RN 9:45 04/16/16 ([email or web address])    Staphylococcus aureus POSITIVE (A) NEGATIVE Final    Comment:        The Xpert SA Assay (FDA approved for NASAL specimens in patients over 57 years of age), is one component of a comprehensive surveillance program.  Test performance has been validated by [facility] for patients greater than  or equal to 66 year old. It is not intended to diagnose infection nor to guide or monitor treatment.          Radiology Studies: No results found.    Scheduled Meds: . calcium acetate  2,001 mg Oral TID WC  . carvedilol  12.5 mg Oral BID WC  . [START ON 04/17/2016] ceFEPime (MAXIPIME) IV  1 g Intravenous Q24H  . Chlorhexidine Gluconate Cloth  6 each Topical Q0600  . insulin aspart  0-9 Units Subcutaneous Q4H  . insulin  glargine  40 Units Subcutaneous Daily  . mupirocin ointment  1 application Nasal BID  . [START ON 04/17/2016] vancomycin  1,000 mg Intravenous Q M,W,F-HD   Continuous Infusions:   LOS: 1 day    Time spent in minutes: 75    [place], MD [facility] Pager: [email or web address] Password [facility] 04/16/2016, 12:52 PM

## 2016-04-16 NOTE — Anesthesia Preprocedure Evaluation (Addendum)
Anesthesia Evaluation  Patient identified by MRN, date of birth, ID band Patient awake    Reviewed: Allergy & Precautions, H&P , NPO status , Patient's Chart, lab work & pertinent test results  History of Anesthesia Complications (+) PONV  Airway Mallampati: II   Neck ROM: full    Dental   Pulmonary asthma , sleep apnea ,    breath sounds clear to auscultation       Cardiovascular hypertension, + CAD and + Cardiac Stents   Rhythm:regular Rate:Normal     Neuro/Psych    GI/Hepatic   Endo/Other  diabetes, Type 2Hypothyroidism obese  Renal/GU ESRF and DialysisRenal disease     Musculoskeletal  (+) Arthritis ,   Abdominal   Peds  Hematology  (+) anemia ,   Anesthesia Other Findings   Reproductive/Obstetrics                             Anesthesia Physical Anesthesia Plan  ASA: III  Anesthesia Plan: General   Post-op Pain Management:    Induction: Intravenous  Airway Management Planned: LMA  Additional Equipment:   Intra-op Plan:   Post-operative Plan:   Informed Consent: I have reviewed the patients History and Physical, chart, labs and discussed the procedure including the risks, benefits and alternatives for the proposed anesthesia with the patient or authorized representative who has indicated his/her understanding and acceptance.     Plan Discussed with: CRNA, Anesthesiologist and Surgeon  Anesthesia Plan Comments:         Anesthesia Quick Evaluation

## 2016-04-16 NOTE — Progress Notes (Signed)
VASCULAR LAB PRELIMINARY  ARTERIAL  ABI completed: Unable to obtain left brachial pressure due to dialysis access. Unable to obtain right posterior tibial and dorsalis pedis pressures due to a below the knee amputation. Unable to obtain left great toe pressure due to bandages. The left posterior tibial artery was unable to be occluded, suggesting calcified arteries.  Left ABI of 1.03 is suggestive of arterial flow within normal limits at rest.   RIGHT    LEFT    PRESSURE WAVEFORM  PRESSURE WAVEFORM  BRACHIAL 187 Triphasic BRACHIAL HD Access   DP BKA  DP 192 1.03  PT BKA  PT >254 1.36    RIGHT LEFT  ABI  1.03     Legrand Como, RVT 04/16/2016, 10:52 AM

## 2016-04-17 DIAGNOSIS — I2510 Atherosclerotic heart disease of native coronary artery without angina pectoris: Secondary | ICD-10-CM

## 2016-04-17 DIAGNOSIS — Z794 Long term (current) use of insulin: Secondary | ICD-10-CM

## 2016-04-17 DIAGNOSIS — J4540 Moderate persistent asthma, uncomplicated: Secondary | ICD-10-CM

## 2016-04-17 DIAGNOSIS — I10 Essential (primary) hypertension: Secondary | ICD-10-CM

## 2016-04-17 DIAGNOSIS — D631 Anemia in chronic kidney disease: Secondary | ICD-10-CM

## 2016-04-17 DIAGNOSIS — E1140 Type 2 diabetes mellitus with diabetic neuropathy, unspecified: Secondary | ICD-10-CM

## 2016-04-17 DIAGNOSIS — E1122 Type 2 diabetes mellitus with diabetic chronic kidney disease: Secondary | ICD-10-CM

## 2016-04-17 LAB — CBC
HCT: 34.7 % — ABNORMAL LOW (ref 39.0–52.0)
Hemoglobin: 10.3 g/dL — ABNORMAL LOW (ref 13.0–17.0)
MCH: 27.2 pg (ref 26.0–34.0)
MCHC: 29.7 g/dL — ABNORMAL LOW (ref 30.0–36.0)
MCV: 91.6 fL (ref 78.0–100.0)
Platelets: 257 10*3/uL (ref 150–400)
RBC: 3.79 MIL/uL — ABNORMAL LOW (ref 4.22–5.81)
RDW: 16.9 % — ABNORMAL HIGH (ref 11.5–15.5)
WBC: 8.6 10*3/uL (ref 4.0–10.5)

## 2016-04-17 LAB — RENAL FUNCTION PANEL
Albumin: 3.2 g/dL — ABNORMAL LOW (ref 3.5–5.0)
Anion gap: 15 (ref 5–15)
BUN: 44 mg/dL — ABNORMAL HIGH (ref 6–20)
CO2: 27 mmol/L (ref 22–32)
Calcium: 9.2 mg/dL (ref 8.9–10.3)
Chloride: 98 mmol/L — ABNORMAL LOW (ref 101–111)
Creatinine, Ser: 11.07 mg/dL — ABNORMAL HIGH (ref 0.61–1.24)
GFR calc Af Amer: 5 mL/min — ABNORMAL LOW (ref >60)
GFR calc non Af Amer: 5 mL/min — ABNORMAL LOW (ref >60)
Glucose, Bld: 147 mg/dL — ABNORMAL HIGH (ref 65–99)
Phosphorus: 7.2 mg/dL — ABNORMAL HIGH (ref 2.5–4.6)
Potassium: 4.9 mmol/L (ref 3.5–5.1)
Sodium: 140 mmol/L (ref 135–145)

## 2016-04-17 LAB — GLUCOSE, CAPILLARY
Glucose-Capillary: 131 mg/dL — ABNORMAL HIGH (ref 65–99)
Glucose-Capillary: 137 mg/dL — ABNORMAL HIGH (ref 65–99)
Glucose-Capillary: 160 mg/dL — ABNORMAL HIGH (ref 65–99)
Glucose-Capillary: 175 mg/dL — ABNORMAL HIGH (ref 65–99)
Glucose-Capillary: 179 mg/dL — ABNORMAL HIGH (ref 65–99)

## 2016-04-17 LAB — HEMOGLOBIN A1C
Hgb A1c MFr Bld: 8.6 % — ABNORMAL HIGH (ref 4.8–5.6)
Mean Plasma Glucose: 200 mg/dL

## 2016-04-17 LAB — VANCOMYCIN, RANDOM: Vancomycin Rm: 12

## 2016-04-17 MED ORDER — DEXTROSE 5 % IV SOLN
2.0000 g | INTRAVENOUS | Status: DC
  Filled 2016-04-17: qty 2

## 2016-04-17 MED ORDER — VANCOMYCIN HCL IN DEXTROSE 1-5 GM/200ML-% IV SOLN: 1000.0000 mg | INTRAVENOUS | Status: DC

## 2016-04-17 MED ORDER — VANCOMYCIN HCL 1000 MG IV SOLR
1750.0000 mg | INTRAVENOUS | Status: AC
  Administered 2016-04-17: 1750 mg via INTRAVENOUS
  Filled 2016-04-17 (×2): qty 1750

## 2016-04-17 MED ORDER — DARBEPOETIN ALFA 40 MCG/0.4ML IJ SOSY
PREFILLED_SYRINGE | INTRAMUSCULAR | Status: AC
  Administered 2016-04-17: 80 ug
  Filled 2016-04-17: qty 0.8

## 2016-04-17 MED ORDER — RENA-VITE PO TABS: 1.0000 | ORAL_TABLET | Freq: Every day | ORAL | 0 refills | Status: DC

## 2016-04-17 MED ORDER — DEXTROSE 5 % IV SOLN: 2.0000 g | INTRAVENOUS | Status: DC

## 2016-04-17 MED ORDER — HYDROCODONE-ACETAMINOPHEN 5-325 MG PO TABS: 1.0000 | ORAL_TABLET | ORAL | 0 refills | Status: DC | PRN

## 2016-04-17 MED ORDER — DARBEPOETIN ALFA 100 MCG/0.5ML IJ SOSY
80.0000 ug | PREFILLED_SYRINGE | INTRAMUSCULAR | Status: DC
  Filled 2016-04-17 (×2): qty 0.5

## 2016-04-17 MED ORDER — DOXERCALCIFEROL 4 MCG/2ML IV SOLN
INTRAVENOUS | Status: AC
  Administered 2016-04-17: 2 ug
  Filled 2016-04-17: qty 2

## 2016-04-17 MED ORDER — VANCOMYCIN HCL IN DEXTROSE 1-5 GM/200ML-% IV SOLN: 1000.0000 mg | INTRAVENOUS | 0 refills | Status: DC

## 2016-04-17 NOTE — Progress Notes (Signed)
Orthopedic Tech Progress Note Patient Details:  Tim Becker 05/12/1965 [id number]  Ortho Devices Type of Ortho Device: Postop shoe/boot Ortho Device/Splint Location: lle Ortho Device/Splint Interventions: Application   [name] 04/17/2016, 3:33 PM

## 2016-04-17 NOTE — Discharge Summary (Signed)
Physician Discharge Summary  Tim Becker BOF:[id number] DOB: 1965-12-03 DOA: 04/15/2016  PCP: Leeroy Cha, MD  Admit date: 04/15/2016 Discharge date: 04/17/2016  Admitted From: home  Disposition:  home   Recommendations for Outpatient Follow-up:  1. Will f/u with Podiatry- duration of IV antibiotics will be per podiatry  Home Health:  ordered  Equipment/Devices:  walker    Discharge Condition:  stable   CODE STATUS:  stable   Diet recommendation:  Diabetic, renal diet Consultations:  podiatry    Discharge Diagnoses:  Principal Problem:   Chronic osteomyelitis of toe of left foot ([place]) Active Problems:   Type 2 diabetes mellitus with diabetic neuropathy ([place])   Essential hypertension   Anemia associated with chronic renal failure   Asthma, moderate persistent   Diabetes mellitus ([place])   CAD in native artery   Morbid (severe) obesity due to excess calories ([facility])   Obstructive apnea   ESRD (end stage renal disease) on dialysis ([place])    Subjective: No complaints.   Brief Summary: Tim Becker a 51 y.o.malewith medical history significant of ESRD on HD M,W,F at [facility]; DM 2, right BKA who is admitted for left great toe amputation planned by his podiatrist.   Hospital Course:  Chronic osteomyelitis of toe of leftgreat toe - underwent partial 1st ray amputation and sesamoidectomy and I and D of abscess -  continue with Vanc/ Cefepime on dialysis days, per podiatry - minimal weight bearing on left foot- post op shoe ordered - f/u with podiatry, Dr Amalia Hailey, in 1 wk.    Anemia associated with chronic renal failure - stable continue to monitor  Asthma, moderate persistentchronic - currently stable symptomatic  CAD in native artery - is stable continue home medications    Diabetic foot infection  - continue with IV antibiotics appreciate pharmacy assistance  Essential hypertension  - stable continue home medications    Type 2  diabetes mellitus with diabetic neuropathy - cont Lantus and mealtime insulin  End-stage renal disease on dialysis  - nephrology notified - underwent dialysis today  Discharge Instructions  Discharge Instructions    Diet - low sodium heart healthy    Complete by:  As directed    Increase activity slowly    Complete by:  As directed      Allergies as of 04/17/2016   No Known Allergies     Medication List    TAKE these medications   calcium acetate 667 MG capsule Commonly known as:  PHOSLO Take 2,001 mg by mouth 3 (three) times daily with meals. 667 mg with snacks   carvedilol 12.5 MG tablet Commonly known as:  COREG Take 12.5 mg by mouth 2 (two) times daily with a meal.   ceFEPIme 2 g in dextrose 5 % 50 mL Inject 2 g into the vein every Monday, Wednesday, and Friday at 8 PM.   HYDROcodone-acetaminophen 5-325 MG tablet Commonly known as:  NORCO/VICODIN Take 1-2 tablets by mouth every 4 (four) hours as needed for moderate pain.   insulin lispro 100 UNIT/ML injection Commonly known as:  HUMALOG Inject 10-25 Units into the skin 3 (three) times daily before meals.   LANTUS SOLOSTAR 100 UNIT/ML Solostar Pen Generic drug:  Insulin Glargine Inject 80 Units into the skin daily.   multivitamin Tabs tablet Take 1 tablet by mouth daily.   vancomycin 1-5 GM/200ML-% Soln Commonly known as:  VANCOCIN Inject 200 mLs (1,000 mg total) into the vein every Monday, Wednesday, and Friday with hemodialysis. Start taking  on:  04/19/2016       No Known Allergies   Procedures/Studies: 1. Partial first ray amputation left foot 2. Sesamoidectomy left foot 3. Incision and drainage, multiple, deep, complicated left foot  Mr Foot Left Wo Contrast  Result Date: 04/09/2016 CLINICAL DATA:  Diabetic foot ulcer.  Ulcer of the left great toe. EXAM: MRI OF THE LEFT FOOT WITHOUT CONTRAST TECHNIQUE: Multiplanar, multisequence MR imaging of the left forefoot was performed. No intravenous  contrast was administered. COMPARISON:  None. FINDINGS: Bones/Joint/Cartilage Soft tissue ulcer along the plantar medial aspect of the great toe. Ulcer extends down to the bone. Extensive T1 hypointensity and T2 hyperintensity in the first proximal and first distal phalanx with bone destruction, most concerning for osteomyelitis. Likely superimposed transverse nondisplaced fracture of the distal aspect of the first proximal phalanx. No other marrow signal abnormality. Mild osteoarthritis of the first MTP joint. Normal alignment. Ligaments Collateral ligaments are intact.  Lisfranc ligament is intact. Muscles and Tendons Diffuse T2 hyperintense signal throughout the musculature off the visualized foot likely neurogenic given patient's history diabetes. Intact flexor, extensor, and peroneal tendons. Soft tissues Mild soft tissue edema of the great toe likely reflecting mild cellulitis. Mild soft tissue edema along the dorsal aspect of the foot. No other soft tissue abnormality. No fluid collection or hematoma. IMPRESSION: 1. Soft tissue ulcer along the plantar medial aspect of the great toe. Marrow edema and bone destruction of the first proximal and distal phalanx most concerning for osteomyelitis. Likely, superimposed transverse nondisplaced fracture of the distal aspect of the first proximal phalanx. Electronically Signed   By: Kathreen Devoid   On: 04/09/2016 10:41   Dg Foot Complete Left  Result Date: 04/02/2016 CLINICAL DATA:  Nonhealing ulcer left great toe EXAM: LEFT FOOT - COMPLETE 3+ VIEW COMPARISON:  02/26/2016 FINDINGS: There is diffuse soft tissue swelling great toe. There is osteolysis and cortical irregularity distal phalanx great toe. Subtle lytic lesion with cortical destruction distal aspect of proximal phalanx great toe. Findings are highly suspicious for osteomyelitis. I cannot exclude a subtle pathologic fracture distal aspect of proximal phalanx. Further correlation with MRI is recommended.  IMPRESSION: There is osteolysis and cortical irregularity/destruction distal phalanx great toe. Subtle lytic lesion with cortical destruction distal aspect of proximal phalanx great toe. Findings are highly suspicious for osteomyelitis. I cannot exclude a subtle pathologic fracture distal aspect of proximal phalanx. Further correlation with MRI is recommended. Electronically Signed   By: Lahoma Crocker M.D.   On: 04/02/2016 10:46       Discharge Exam: Vitals:   04/17/16 1200 04/17/16 1225  BP: 111/63 114/73  Pulse: 70 68  Resp: 18 18  Temp:  98.1 F (36.7 C)   Vitals:   04/17/16 1100 04/17/16 1130 04/17/16 1200 04/17/16 1225  BP: 131/72 124/71 111/63 114/73  Pulse: 72 69 70 68  Resp: 18 18 18 18   Temp:    98.1 F (36.7 C)  TempSrc:    Oral  SpO2:    95%  Weight:    112.5 kg (248 lb 0.3 oz)  Height:        General: Pt is alert, awake, not in acute distress Cardiovascular: RRR, S1/S2 +, no rubs, no gallops Respiratory: CTA bilaterally, no wheezing, no rhonchi Abdominal: Soft, NT, ND, bowel sounds + Extremities: no edema, no cyanosis    The results of significant diagnostics from this hospitalization (including imaging, microbiology, ancillary and laboratory) are listed below for reference.     Microbiology: Recent  Results (from the past 240 hour(s))  Blood culture (routine x 2)     Status: None (Preliminary result)   Collection Time: 04/15/16  5:40 PM  Result Value Ref Range Status   Specimen Description BLOOD RIGHT ANTECUBITAL  Final   Special Requests BOTTLES DRAWN AEROBIC AND ANAEROBIC 5CC  Final   Culture NO GROWTH < 24 HOURS  Final   Report Status PENDING  Incomplete  Blood culture (routine x 2)     Status: None (Preliminary result)   Collection Time: 04/15/16  7:49 PM  Result Value Ref Range Status   Specimen Description BLOOD RIGHT ARM  Final   Special Requests IN PEDIATRIC BOTTLE 3CC  Final   Culture NO GROWTH < 24 HOURS  Final   Report Status PENDING   Incomplete  Surgical PCR screen     Status: Abnormal   Collection Time: 04/16/16 12:31 AM  Result Value Ref Range Status   MRSA, PCR POSITIVE (A) NEGATIVE Final    Comment: RESULT CALLED TO, READ BACK BY AND VERIFIED WITH: Ted Mcalpine RN 9:45 04/16/16 ([email or web address])    Staphylococcus aureus POSITIVE (A) NEGATIVE Final    Comment:        The Xpert SA Assay (FDA approved for NASAL specimens in patients over 105 years of age), is one component of a comprehensive surveillance program.  Test performance has been validated by [facility] for patients greater than or equal to 32 year old. It is not intended to diagnose infection nor to guide or monitor treatment.   Aerobic/Anaerobic Culture (surgical/deep wound)     Status: None (Preliminary result)   Collection Time: 04/16/16  8:32 PM  Result Value Ref Range Status   Specimen Description WOUND  Final   Special Requests LEFT GREAT TOE PATIENT ON FOLLOWING VANCOMYCIN  Final   Gram Stain   Final    RARE WBC PRESENT, PREDOMINANTLY PMN RARE GRAM NEGATIVE RODS    Culture PENDING  Incomplete   Report Status PENDING  Incomplete     Labs: BNP (last 3 results) No results for input(s): BNP in the last 8760 hours. Basic Metabolic Panel:  Recent Labs Lab 04/15/16 1742 04/16/16 0356 04/17/16 0650  NA 138 138 140  K 4.7 4.4 4.9  CL 95* 98* 98*  CO2 28 28 27   GLUCOSE 202* 199* 147*  BUN 26* 31* 44*  CREATININE 7.28* 8.26* 11.07*  CALCIUM 8.9 8.8* 9.2  MG  --  2.1  --   PHOS  --  5.5* 7.2*   Liver Function Tests:  Recent Labs Lab 04/15/16 1742 04/16/16 0356 04/17/16 0650  AST 15 11*  --   ALT 12* 14*  --   ALKPHOS 56 47  --   BILITOT 0.4 0.7  --   PROT 8.0 7.1  --   ALBUMIN 3.0* 3.1* 3.2*   No results for input(s): LIPASE, AMYLASE in the last 168 hours. No results for input(s): AMMONIA in the last 168 hours. CBC:  Recent Labs Lab 04/15/16 1742 04/16/16 0356 04/17/16 0650  WBC 7.8 7.4 8.6  NEUTROABS 5.6  --   --    HGB 10.2* 10.1* 10.3*  HCT 34.6* 34.2* 34.7*  MCV 91.8 91.7 91.6  PLT 236 239 257   Cardiac Enzymes: No results for input(s): CKTOTAL, CKMB, CKMBINDEX, TROPONINI in the last 168 hours. BNP: Invalid input(s): POCBNP CBG:  Recent Labs Lab 04/16/16 2106 04/16/16 2141 04/17/16 0022 04/17/16 0422 04/17/16 0740  GLUCAP 104* 97 175* 179* 137*  D-Dimer No results for input(s): DDIMER in the last 72 hours. Hgb A1c  Recent Labs  04/16/16 0356  HGBA1C 8.6*   Lipid Profile No results for input(s): CHOL, HDL, LDLCALC, TRIG, CHOLHDL, LDLDIRECT in the last 72 hours. Thyroid function studies  Recent Labs  04/16/16 0356  TSH 4.430   Anemia work up No results for input(s): VITAMINB12, FOLATE, FERRITIN, TIBC, IRON, RETICCTPCT in the last 72 hours. Urinalysis    Component Value Date/Time   COLORURINE YELLOW (A) 06/27/2015 0904   APPEARANCEUR CLEAR (A) 06/27/2015 0904   LABSPEC 1.016 06/27/2015 0904   PHURINE 5.0 06/27/2015 0904   GLUCOSEU NEGATIVE 06/27/2015 0904   HGBUR NEGATIVE 06/27/2015 0904   BILIRUBINUR NEGATIVE 06/27/2015 0904   KETONESUR NEGATIVE 06/27/2015 0904   PROTEINUR 100 (A) 06/27/2015 0904   NITRITE NEGATIVE 06/27/2015 0904   LEUKOCYTESUR NEGATIVE 06/27/2015 0904   Sepsis Labs Invalid input(s): PROCALCITONIN,  WBC,  LACTICIDVEN Microbiology Recent Results (from the past 240 hour(s))  Blood culture (routine x 2)     Status: None (Preliminary result)   Collection Time: 04/15/16  5:40 PM  Result Value Ref Range Status   Specimen Description BLOOD RIGHT ANTECUBITAL  Final   Special Requests BOTTLES DRAWN AEROBIC AND ANAEROBIC 5CC  Final   Culture NO GROWTH < 24 HOURS  Final   Report Status PENDING  Incomplete  Blood culture (routine x 2)     Status: None (Preliminary result)   Collection Time: 04/15/16  7:49 PM  Result Value Ref Range Status   Specimen Description BLOOD RIGHT ARM  Final   Special Requests IN PEDIATRIC BOTTLE 3CC  Final   Culture NO  GROWTH < 24 HOURS  Final   Report Status PENDING  Incomplete  Surgical PCR screen     Status: Abnormal   Collection Time: 04/16/16 12:31 AM  Result Value Ref Range Status   MRSA, PCR POSITIVE (A) NEGATIVE Final    Comment: RESULT CALLED TO, READ BACK BY AND VERIFIED WITH: Ted Mcalpine RN 9:45 04/16/16 ([email or web address])    Staphylococcus aureus POSITIVE (A) NEGATIVE Final    Comment:        The Xpert SA Assay (FDA approved for NASAL specimens in patients over 61 years of age), is one component of a comprehensive surveillance program.  Test performance has been validated by [facility] for patients greater than or equal to 60 year old. It is not intended to diagnose infection nor to guide or monitor treatment.   Aerobic/Anaerobic Culture (surgical/deep wound)     Status: None (Preliminary result)   Collection Time: 04/16/16  8:32 PM  Result Value Ref Range Status   Specimen Description WOUND  Final   Special Requests LEFT GREAT TOE PATIENT ON FOLLOWING VANCOMYCIN  Final   Gram Stain   Final    RARE WBC PRESENT, PREDOMINANTLY PMN RARE GRAM NEGATIVE RODS    Culture PENDING  Incomplete   Report Status PENDING  Incomplete     Time coordinating discharge: Over 30 minutes  SIGNED:   Debbe Odea, MD  [facility] 04/17/2016, 1:13 PM Pager   If 7PM-7AM, please contact night-coverage [email or web address] Password [email or web address]

## 2016-04-17 NOTE — Progress Notes (Signed)
Pharmacy Antibiotic Note  Sherill Wegener is a 51 y.o. male admitted on 04/15/2016 with left great toe osteomyelitis requiring amputation. The patient was noted to be on Vancomycin PTA for MRSA osteo with the last dose PTA stated to be with HD on 1/15. The patient is s/p partial first ray amp and I&D on 1/16 with cx sent. Pharmacy is on board for Vancomycin and Cefepime dosing.  A pre-HD Vancomycin level this morning was SUBtherapeutic at 12 mcg/ml (goal of 15-25 mcg/ml). The patient is schedule to receive HD this morning in the 1st session so will plan to boost the patient's level back up post-HD today. Will follow-up with culture results taken intra-op  Plan: 1. Vancomycin 1750 mg post HD today 2. Resume Vancomycin 1g/HD-MWF starting on 1/19 3. Adjust Cefepime to 2g on MWF at 2000 4. Will continue to follow HD schedule/duration, culture results, LOT, and antibiotic de-escalation plans   Temp (24hrs), Avg:97.9 F (36.6 C), Min:97.3 F (36.3 C), Max:98.5 F (36.9 C)   Recent Labs Lab 04/15/16 1742 04/15/16 1754 04/15/16 2019 04/16/16 0356 04/17/16 0545 04/17/16 0650  WBC 7.8  --   --  7.4  --  8.6  CREATININE 7.28*  --   --  8.26*  --  11.07*  LATICACIDVEN  --  1.61 2.34*  --   --   --   VANCORANDOM  --   --   --   --  12  --      No Known Allergies  Antimicrobials this admission:  Vanc PTA >> Cefepime 1/16 >>  Dose adjustments this admission:  1/17: VR 12 mcg/ml >> will give 1750 mg post HD x 1, then resume 1g/HD-MWF  Microbiology results:  1/15 BCx >> 1/16 MRSA PCR >> positive 1/16 WCx (intra-op) >> GS showing GNR  Thank you for allowing pharmacy to be a part of this patient's care.  Alycia Rossetti, PharmD, BCPS Clinical Pharmacist Pager: [phone] Clinical phone for 04/17/2016 from 7a-3:30p: [phone] If after 3:30p, please call main pharmacy at: [phone] 04/17/2016 8:05 AM

## 2016-04-17 NOTE — Care Management Note (Signed)
Case Management Note  Patient Details  Name: Tim Becker MRN: [id number] Date of Birth: 1965/07/05  Subjective/Objective:      CM following for progression and d/c planning.               Action/Plan:  04/17/2016 Pt for d/c to home will need HHPT, pt selected [facility]. Pt states that he has a walker and no other DME needed. [place] notified of HHPT needs.  Expected Discharge Date:  04/17/16               Expected Discharge Plan:  [place]  In-House Referral:  NA  Discharge planning Services  CM Consult  Post Acute Care Choice:  Home Health Choice offered to:  Patient  DME Arranged:  N/A DME Agency:  NA  HH Arranged:  PT [place] Agency:  [place]  Status of Service:  Completed, signed off  If discussed at [place] of Stay Meetings, dates discussed:    Additional Comments:  Adron Bene, RN 04/17/2016, 2:02 PM

## 2016-04-17 NOTE — Procedures (Signed)
I have seen and examined this patient and agree with the plan of care   BP 166/86   Ca 9.2   Phos 7.2  Alb 3.2   Hb 10.2   right BKA who is admitted for left great toe amputation Partial first ray amputation left foot    Adjust Vancomycin  And Cefepime    Gerod Caligiuri W 04/17/2016, 9:10 AM

## 2016-04-17 NOTE — Progress Notes (Signed)
   Subjective:  Patient presents today for follow-up evaluation of an ulceration to the left great toe secondary to diabetes mellitus. Patient was last seen here in the office on 03/11/2016 proximal limb 1 month ago. Patient was referred to [facility] wound care center for possible hyperbaric oxygen treatment regarding the ulceration to the left great toe. However over the past month the condition deteriorated rapidly and wound care center for him back here for surgical intervention. Patient understands that he likely requires amputation of the left great toe. Patient presents today for surgical consultation and follow-up evaluation treatment    Objective/Physical Exam General: The patient is alert and oriented x3 in no acute distress.  Dermatology: Ulceration noted diffusely overlying the left great toe. Ulceration measures approximately 2.5 cm in length I2.2 with by 0.3 cm in depth. To the noted ulceration there is intermittent eschar. There is an extensive amount of slough fibrin and necrotic tissue noted. There is a significant malodor. The ulceration probes to bone.  Vascular: Pedal pulses diminished on the left lower extremity. Patient has a below-knee amputation right lower extremity  Neurological: Epicritic and protective threshold absent left lower extremity  Musculoskeletal Exam: Below-knee amputation right lower extremity.   Radiographic Exam:  Radiographic exam taken on 04/02/2016 of the left foot indicate osseous degenerative changes involving the proximal phalanx of the left great toe consistent with osteomyelitis.  Assessment: #1 osteomyelitis left great toe #2 cellulitis left foot   Plan of Care:  #1 Patient was evaluated. #2 today we recommend surgical amputation of the left great toe and possible partial first ray. Patient understands the risks involved with not proceeding with surgery which would include possible more proximal amputation. All possible complications and  details of the procedure were explained. All patient questions were answered. No guarantees were expressed or implied. #3 instructed the patient to present to the emergency department at [facility] for admission and surgical intervention to be scheduled #4 podiatry will follow while he is inpatient at [facility] #5 follow-up in the office post discharge  Edrick Kins, DPM [facility]  Dr. Edrick Kins, [place]                                        [place], [place] [address]                Office [phone]  Fax [phone]

## 2016-04-17 NOTE — Progress Notes (Signed)
Orthopedic Tech Progress Note Patient Details:  Tim Becker [birth year] [id number]  Ortho Devices Type of Ortho Device: Postop shoe/boot Ortho Device/Splint Location: lle Ortho Device/Splint Interventions: Application   [name] 04/17/2016, 3:34 PM

## 2016-04-17 NOTE — Clinical Social Work Note (Signed)
CSW informed that patient discharging home today. Nurse provided with taxi voucher for patient.  Carla Whilden Givens, MSW, LCSW Licensed Clinical Social Worker [place] [phone]

## 2016-04-18 ENCOUNTER — Encounter (HOSPITAL_COMMUNITY): Payer: Medicare Other

## 2016-04-18 ENCOUNTER — Encounter (HOSPITAL_COMMUNITY): Payer: Self-pay | Admitting: Podiatry

## 2016-04-19 ENCOUNTER — Telehealth: Payer: Self-pay | Admitting: *Deleted

## 2016-04-19 NOTE — Telephone Encounter (Signed)
Tillie Rung, [place] states pt missed Wednesday and Friday dialysis and IV antibiotic, does Dr. Amalia Hailey want to give him anything else until Monday. Tillie Rung states she did not enter pt in to PT, he is getting around fine and she told him not to over do it. I informed Dr. Amalia Hailey and he states not a problem, is more concerned with pt missing dialysis.  I informed Tillie Rung and I will contact pt. I informed pt of Dr. Amalia Hailey recommendation and not to over do his being up on the surgery foot more than 15 minutes per hour. Pt states understanding.

## 2016-04-20 LAB — CULTURE, BLOOD (ROUTINE X 2)
Culture: NO GROWTH
Culture: NO GROWTH

## 2016-04-22 ENCOUNTER — Ambulatory Visit (INDEPENDENT_AMBULATORY_CARE_PROVIDER_SITE_OTHER): Payer: Self-pay | Admitting: Podiatry

## 2016-04-22 ENCOUNTER — Telehealth: Payer: Self-pay | Admitting: Vascular Surgery

## 2016-04-22 ENCOUNTER — Ambulatory Visit (INDEPENDENT_AMBULATORY_CARE_PROVIDER_SITE_OTHER): Payer: Medicare Other

## 2016-04-22 ENCOUNTER — Encounter: Payer: Self-pay | Admitting: Podiatry

## 2016-04-22 VITALS — Temp 99.2°F

## 2016-04-22 DIAGNOSIS — E11621 Type 2 diabetes mellitus with foot ulcer: Secondary | ICD-10-CM

## 2016-04-22 DIAGNOSIS — E1143 Type 2 diabetes mellitus with diabetic autonomic (poly)neuropathy: Secondary | ICD-10-CM

## 2016-04-22 DIAGNOSIS — L97529 Non-pressure chronic ulcer of other part of left foot with unspecified severity: Secondary | ICD-10-CM

## 2016-04-22 DIAGNOSIS — Z9889 Other specified postprocedural states: Secondary | ICD-10-CM

## 2016-04-22 NOTE — Telephone Encounter (Signed)
Contacted pt to reschedule appt w/ Dr.Early and for abi's that was cancelled due to inclement weather on 04/18/16. Patient states he had vascular study-abi's at [facility] on 04/16/16 and he did not need to see a doctor here at [facility]. Notified Karilyn Cota, [place] w/ [facility] of this as well. awt

## 2016-04-22 NOTE — Anesthesia Postprocedure Evaluation (Addendum)
Anesthesia Post Note  Patient: Tim Becker  Procedure(s) Performed: Procedure(s) (LRB): IRRIGATION AND DEBRIDEMENT EXTREMITY/GREAT TOE AMP. (Left)  Patient location during evaluation: PACU Anesthesia Type: General Level of consciousness: awake and alert and patient cooperative Pain management: pain level controlled Vital Signs Assessment: post-procedure vital signs reviewed and stable Respiratory status: spontaneous breathing and respiratory function stable Cardiovascular status: stable Anesthetic complications: no       Last Vitals:  Vitals:   04/17/16 1200 04/17/16 1225  BP: 111/63 114/73  Pulse: 70 68  Resp: 18 18  Temp:  36.7 C    Last Pain:  Vitals:   04/17/16 1225  TempSrc: Oral  PainSc:                  [name]

## 2016-04-23 ENCOUNTER — Encounter: Payer: Medicare Other | Admitting: Vascular Surgery

## 2016-04-24 LAB — AEROBIC/ANAEROBIC CULTURE W GRAM STAIN (SURGICAL/DEEP WOUND)

## 2016-04-24 LAB — AEROBIC/ANAEROBIC CULTURE (SURGICAL/DEEP WOUND)

## 2016-04-28 NOTE — Progress Notes (Signed)
Subjective: Patient presents today status post partial first ray amputation left foot with sesamoidectomy. Date of surgery 04/16/2016. Patient states that he is doing okay. Patient denies pain.  Objective: Skin incisions are well coapted. Sutures are intact. No sign of infectious process.  Assessment: Status post partial first ray amputation left foot secondary to diabetes mellitus and osteomyelitis. Date of surgery 04/16/2016  Plan of care: Today dressings were changed. Return to clinic in 1 week. Continue IV antibiotic therapy via dialysis. Recommend IV vancomycin based on cultures and sensitivities

## 2016-04-29 ENCOUNTER — Ambulatory Visit (INDEPENDENT_AMBULATORY_CARE_PROVIDER_SITE_OTHER): Payer: Medicare Other | Admitting: Podiatry

## 2016-04-29 ENCOUNTER — Encounter: Payer: Self-pay | Admitting: Podiatry

## 2016-04-29 VITALS — Temp 98.0°F

## 2016-04-29 DIAGNOSIS — E1143 Type 2 diabetes mellitus with diabetic autonomic (poly)neuropathy: Secondary | ICD-10-CM

## 2016-04-29 DIAGNOSIS — E08621 Diabetes mellitus due to underlying condition with foot ulcer: Secondary | ICD-10-CM

## 2016-04-29 DIAGNOSIS — I70245 Atherosclerosis of native arteries of left leg with ulceration of other part of foot: Secondary | ICD-10-CM | POA: Diagnosis not present

## 2016-04-29 DIAGNOSIS — L97522 Non-pressure chronic ulcer of other part of left foot with fat layer exposed: Secondary | ICD-10-CM | POA: Diagnosis not present

## 2016-04-29 DIAGNOSIS — E0843 Diabetes mellitus due to underlying condition with diabetic autonomic (poly)neuropathy: Secondary | ICD-10-CM

## 2016-04-29 DIAGNOSIS — Z9889 Other specified postprocedural states: Secondary | ICD-10-CM

## 2016-04-29 DIAGNOSIS — L97509 Non-pressure chronic ulcer of other part of unspecified foot with unspecified severity: Secondary | ICD-10-CM

## 2016-04-29 NOTE — Progress Notes (Signed)
   Subjective:  Patient presents today status post partial first ray amputation left foot with sesamoidectomy. Date of surgery 04/16/2016. Patient states that he is doing well. Patient is concerned for a new complaint of an ulceration to the lateral aspect of the fifth digit left foot. Patient states that he did not notice the discoloration last week. Patient denies trauma. Patient states that he is still receiving IV vancomycin antibiotic at dialysis 3 times per week     Objective/Physical Exam General: The patient is alert and oriented x3 in no acute distress.  Dermatology: Skin incision site to the partial first ray amputation is well coapted. Sutures are intact. No sign of infectious process. Incision site appears dry and stable.  New ulceration noted to the lateral aspect of the fifth digit left foot measuring [phone] cm (LxWxD). To the noted ulceration there is no overlying superficial eschar. Upon debridement of the eschar, there is a minimal amount of slough fibrin and necrotic tissue noted. Granulation tissue and wound base is red. There is minimal amount of serosanguineous drainage noted. Periwound integrity is intact.  Vascular: Palpable pedal pulsesleft lower extremity.  Neurological: Epicritic and protective thresholdabsent  bilaterally.   Musculoskeletal Exam: Below-knee amputation right lower extremity. Status post partial first ray amputation left lower extremity, stable.   Assessment: #1 Status post partial first ray amputation left foot with sesamoidectomy, with routine healing. #2 ulceration fifth digit left foot secondary to diabetes mellitus   Plan of Care:  #1 Patient was evaluated. #2 Medically necessary excisional debridement including subcutaneous tissues performed to the ulceration of the fifth digit. Excisional debridement of all the necrotic nonviable tissue down to healthy bleeding viable tissue was performed with post-debridement measurements and was  pre-. #3 the wound was cleansed with normal saline and collagen dressing and dry sterile dressing applied #4 dressings applied to the surgical amputation site #5 return to clinic in 1 week for possible suture removal   Edrick Kins, DPM [facility]  Dr. Edrick Kins, [address] [place]                                        [place], [place] [address]                Office [phone]  Fax [phone]

## 2016-05-06 ENCOUNTER — Ambulatory Visit (INDEPENDENT_AMBULATORY_CARE_PROVIDER_SITE_OTHER): Payer: Medicare Other

## 2016-05-06 ENCOUNTER — Ambulatory Visit (INDEPENDENT_AMBULATORY_CARE_PROVIDER_SITE_OTHER): Payer: Medicare Other | Admitting: Podiatry

## 2016-05-06 DIAGNOSIS — I70245 Atherosclerosis of native arteries of left leg with ulceration of other part of foot: Secondary | ICD-10-CM

## 2016-05-06 DIAGNOSIS — Z9889 Other specified postprocedural states: Secondary | ICD-10-CM

## 2016-05-06 DIAGNOSIS — E11621 Type 2 diabetes mellitus with foot ulcer: Secondary | ICD-10-CM

## 2016-05-06 DIAGNOSIS — L97529 Non-pressure chronic ulcer of other part of left foot with unspecified severity: Secondary | ICD-10-CM | POA: Diagnosis not present

## 2016-05-06 DIAGNOSIS — L97522 Non-pressure chronic ulcer of other part of left foot with fat layer exposed: Secondary | ICD-10-CM | POA: Diagnosis not present

## 2016-05-12 NOTE — Progress Notes (Signed)
   Subjective:  Patient presents today status post partial first ray amputation left foot with sesamoidectomy. Date of surgery 04/16/2016. Patient states that he is doing well. Patient states that he is still receiving IV vancomycin antibiotic at dialysis 3 times per week Patient leaves the appearance of the amputation stump has improved.     Objective/Physical Exam General: The patient is alert and oriented x3 in no acute distress.  Dermatology: Skin incision site to the partial first ray amputation is well coapted. Sutures are intact. No sign of infectious process. Incision site appears dry and stable.  Ulceration noted to the lateral aspect of the fifth digit left foot measuring [phone] cm (LxWxD). To the noted ulceration there is no overlying superficial eschar. Upon debridement of the eschar, there is a minimal amount of slough fibrin and necrotic tissue noted. Granulation tissue and wound base is red. There is minimal amount of serosanguineous drainage noted. Periwound integrity is intact.  Vascular: Palpable pedal pulsesleft lower extremity.  Neurological: Epicritic and protective thresholdabsent  left.   Musculoskeletal Exam: Below-knee amputation right lower extremity. Status post partial first ray amputation left lower extremity, stable.   Assessment: #1 Status post partial first ray amputation left foot with sesamoidectomy, with routine healing. #2 ulceration fifth digit left foot secondary to diabetes mellitus #3 history of below-knee amputation right lower extremity   Plan of Care:  #1 Patient was evaluated. #2 Medically necessary excisional debridement including subcutaneous tissues performed to the ulceration of the fifth digit. Excisional debridement of all the necrotic nonviable tissue down to healthy bleeding viable tissue was performed with post-debridement measurements and was pre-. #3 the wound was cleansed with normal saline and collagen dressing and dry sterile  dressing applied #4 dressings applied to the surgical amputation site #5 today we are going to order supplies for home dressing changes. Supplies will consist of large Kerlix and 4 x 4 gauze. #6 return to clinic in 2 weeks   Edrick Kins, DPM [facility]  Dr. Edrick Kins, [address]                                        [place], [place] [address]                Office [phone]  Fax [phone]

## 2016-05-13 ENCOUNTER — Ambulatory Visit (INDEPENDENT_AMBULATORY_CARE_PROVIDER_SITE_OTHER): Payer: Medicare Other | Admitting: Podiatry

## 2016-05-13 DIAGNOSIS — I70245 Atherosclerosis of native arteries of left leg with ulceration of other part of foot: Secondary | ICD-10-CM

## 2016-05-13 DIAGNOSIS — Z89511 Acquired absence of right leg below knee: Secondary | ICD-10-CM

## 2016-05-13 DIAGNOSIS — Z9889 Other specified postprocedural states: Secondary | ICD-10-CM

## 2016-05-13 DIAGNOSIS — IMO0002 Reserved for concepts with insufficient information to code with codable children: Secondary | ICD-10-CM

## 2016-05-13 DIAGNOSIS — L97522 Non-pressure chronic ulcer of other part of left foot with fat layer exposed: Secondary | ICD-10-CM | POA: Diagnosis not present

## 2016-05-13 DIAGNOSIS — T8789 Other complications of amputation stump: Secondary | ICD-10-CM

## 2016-05-13 DIAGNOSIS — T8189XA Other complications of procedures, not elsewhere classified, initial encounter: Secondary | ICD-10-CM

## 2016-05-13 NOTE — Telephone Encounter (Addendum)
-----   Message from Edrick Kins, DPM sent at 05/12/2016  6:14 PM EST ----- Regarding: Home dressing supplies Please order home dressing supplies for patient. Dressing changes will be 3x/week x 6 weeks.   Dx : s/p partial first ray amputation left.   Supplies needed:  - large kerlex - 4x4 gauze.   Thanks, Dr. Amalia Hailey. 05/13/2016-DrAmalia Hailey ordered 4 x4 gauze sponge, large antimicrobial  bulky roll gauze for left 5th foot ulcer L97.522, measuring 0.5 x 0.5 x 0.1cm for dressing changes 3 times a week from [facility]. Orders faxed to [facility]. 05/20/2016-DrAmalia Hailey referred pt to [facility].05/22/2016-Faxed required form, clinicals and demographics to [facility]. Dr. Amalia Hailey ordered 4" Coban from [facility] for non-healing left foot ulcer s/p partial 1st ray amputation left foot with dehiscence T81/31xD for every other day dressing changes, measuring 0.3 x 0.3 x 0.1cm, with moderate drainage. Orders faxed to [facility]. 06/10/2016-[facility] states she needs x-ray report from last visit. I told Anderson Malta I would have Dr. Amalia Hailey make an addendum.

## 2016-05-13 NOTE — Progress Notes (Signed)
   Subjective:  Patient presents today status post partial first ray amputation left foot with sesamoidectomy. Date of surgery 04/16/2016. Patient states that he is doing well however over the weekend he did notice some dehiscence with drainage noted to the amputation incision site.. Patient states that he is still receiving IV vancomycin antibiotic at dialysis 3 times per week  Patient also presents today for follow-up evaluation of a small ulceration to the fifth digit left foot.     Objective/Physical Exam General: The patient is alert and oriented x3 in no acute distress.  Dermatology: Skin incision site to the partial first ray amputation is well coapted. Sutures are intact. No sign of infectious process. Incision site appears dry and stable.  Ulceration noted to the lateral aspect of the fifth digit left foot measuring [phone] cm (LxWxD). To the noted ulceration there is no overlying superficial eschar. Upon debridement of the eschar, there is a minimal amount of slough fibrin and necrotic tissue noted. Granulation tissue and wound base is red. There is minimal amount of serosanguineous drainage noted. Periwound integrity is intact.  Surgical amputation incision dehiscence is noted to the left foot amputation site. Dehiscence measures approximately [phone] cm (LxWxD). Negative for malodor. There is a minimal amount of serous drainage noted. There is a moderate amount of slough fibrin and necrotic tissue noted within the dehiscence site.  Vascular: Palpable pedal pulsesleft lower extremity.  Neurological: Epicritic and protective thresholdabsent  left.   Musculoskeletal Exam: Below-knee amputation right lower extremity. Status post partial first ray amputation left lower extremity, stable.   Assessment: #1 Status post partial first ray amputation left foot with sesamoidectomy, with dehiscence #2 ulceration fifth digit left foot secondary to diabetes mellitus #3 history of  below-knee amputation right lower extremity   Plan of Care:  #1 Patient was evaluated. #2 Medically necessary excisional debridement including subcutaneous tissues performed to the ulceration of the fifth digit. Excisional debridement of all the necrotic nonviable tissue down to healthy bleeding viable tissue was performed with post-debridement measurements and was pre-. #3 the wound was cleansed with normal saline and dry sterile dressing applied #4 dressings applied to the surgical amputation site #5 today we are going to refer the patient to the [facility] wound care center for further treatment and evaluation. Patient may be a great candidate for hyperbaric oxygen treatment. At this point we will transfer care to the wound care center under the care of Dr. Con Memos or Dellia Nims for further management and treatment. At this point we want to do all we can to salvage the limb and prevent below-knee amputation of the left lower extremity. #6 return to clinic in 1 month for follow-up evaluation   Edrick Kins, DPM [facility]  Dr. Edrick Kins, [address] [place]                                        [place], [place] [address]                Office [phone]  Fax [phone]

## 2016-05-14 ENCOUNTER — Encounter: Payer: Medicare Other | Admitting: Vascular Surgery

## 2016-05-20 ENCOUNTER — Ambulatory Visit (INDEPENDENT_AMBULATORY_CARE_PROVIDER_SITE_OTHER): Payer: Medicare Other | Admitting: Podiatry

## 2016-05-20 DIAGNOSIS — L97522 Non-pressure chronic ulcer of other part of left foot with fat layer exposed: Secondary | ICD-10-CM

## 2016-05-20 DIAGNOSIS — Z9889 Other specified postprocedural states: Secondary | ICD-10-CM

## 2016-05-20 DIAGNOSIS — T8131XD Disruption of external operation (surgical) wound, not elsewhere classified, subsequent encounter: Secondary | ICD-10-CM | POA: Diagnosis not present

## 2016-05-20 DIAGNOSIS — I70245 Atherosclerosis of native arteries of left leg with ulceration of other part of foot: Secondary | ICD-10-CM | POA: Diagnosis not present

## 2016-05-22 ENCOUNTER — Other Ambulatory Visit: Payer: Self-pay | Admitting: Podiatry

## 2016-05-22 NOTE — Progress Notes (Signed)
   Subjective:  Patient presents today status post partial first ray amputation left foot with sesamoidectomy. Date of surgery 04/16/2016.  Patient states that he is still receiving IV vancomycin antibiotic at dialysis 3 times per week Patient also presents today for follow-up evaluation of a small ulceration to the fifth digit left foot.   Objective/Physical Exam General: The patient is alert and oriented x3 in no acute distress.  Dermatology: Skin incision site to the partial first ray amputation is well coapted. Sutures are intact. No sign of infectious process. Incision site appears dry and stable.  Ulceration noted to the lateral aspect of the fifth digit left foot measuring [phone] cm (LxWxD). To the noted ulceration there is no overlying superficial eschar. Upon debridement of the eschar, there is a minimal amount of slough fibrin and necrotic tissue noted. Granulation tissue and wound base is red. There is minimal amount of serosanguineous drainage noted. Periwound integrity is intact.  Surgical amputation incision dehiscence is noted to the left foot amputation site. Dehiscence measures approximately [phone] cm (LxWxD). Negative for malodor. There is a minimal amount of serous drainage noted. There is a moderate amount of slough fibrin and necrotic tissue noted within the dehiscence site.eriwound integrity is mildly macerated  Vascular: Palpable pedal pulses left lower extremity.   Neurological: Epicritic and protective thresholdabsent  left.   Musculoskeletal Exam: Below-knee amputation right lower extremity. Status post partial first ray amputation left lower extremity, stable.   Assessment: #1 Status post partial first ray amputation left foot with sesamoidectomy, with dehiscence #2 ulceration fifth digit left foot secondary to diabetes mellitus #3 history of below-knee amputation right lower extremity   Plan of Care:  #1 Patient was evaluated. #2 Medically necessary  excisional debridement including subcutaneous tissues performed to the ulceration of the fifth digit. Excisional debridement of all the necrotic nonviable tissue down to healthy bleeding viable tissue was performed with post-debridement measurements and was pre-. #3 the wound was cleansed with normal saline and dry sterile dressing applied #4 dressings applied to the surgical amputation site #5 today we are going to refer the patient to the [facility] wound care center for further treatment and evaluation. Patient may be a great candidate for hyperbaric oxygen treatment. At this point we will transfer care to the wound care center under the care of Dr. Con Memos or Dellia Nims for further management and treatment. At this point we want to do all we can to salvage the limb and prevent below-knee amputation of the left lower extremity. #6 return to clinic in 1 month for follow-up evaluation   Edrick Kins, DPM [facility]  Dr. Edrick Kins, [address] [place]                                        [place],  [address]                Office [phone]  Fax [phone]

## 2016-06-03 ENCOUNTER — Ambulatory Visit (INDEPENDENT_AMBULATORY_CARE_PROVIDER_SITE_OTHER): Payer: Medicare Other | Admitting: Podiatry

## 2016-06-03 DIAGNOSIS — L97522 Non-pressure chronic ulcer of other part of left foot with fat layer exposed: Secondary | ICD-10-CM | POA: Diagnosis not present

## 2016-06-03 DIAGNOSIS — T8131XD Disruption of external operation (surgical) wound, not elsewhere classified, subsequent encounter: Secondary | ICD-10-CM | POA: Diagnosis not present

## 2016-06-03 DIAGNOSIS — I70245 Atherosclerosis of native arteries of left leg with ulceration of other part of foot: Secondary | ICD-10-CM | POA: Diagnosis not present

## 2016-06-03 DIAGNOSIS — Z9889 Other specified postprocedural states: Secondary | ICD-10-CM

## 2016-06-03 NOTE — Progress Notes (Addendum)
   Subjective:  Patient presents today status post partial first ray amputation left foot with sesamoidectomy.Patient also presents today for follow-up evaluation of a small ulceration to the fifth digit left foot. The patient states that he has not been able to get into the [facility] wound care center yet and so he will continue to present here in the office until he is evaluated the wound care center for possible hyperbaric oxygen treatment.   Objective/Physical Exam General: The patient is alert and oriented x3 in no acute distress.  Dermatology: Skin incision site to the partial first ray amputation is well coapted. Sutures are intact. No sign of infectious process. Incision site appears dry and stable.  Ulceration noted to the lateral aspect of the fifth digit left foot measuring [phone] cm (LxWxD). To the noted ulceration there is no overlying superficial eschar. Upon debridement of the eschar, there is a minimal amount of slough fibrin and necrotic tissue noted. Granulation tissue and wound base is red. There is minimal amount of serosanguineous drainage noted. Periwound integrity is intact.  Surgical amputation incision dehiscence is noted to the left foot amputation site. Dehiscence measures approximately 004.004.004.004 cm (LxWxD). Negative for malodor. There is a minimal amount of serous drainage noted. There is a moderate amount of slough fibrin and necrotic tissue noted within the dehiscence site.eriwound integrity is mildly macerated  Vascular: Palpable pedal pulses left lower extremity.   Neurological: Epicritic and protective thresholdabsent  left.   Musculoskeletal Exam: Below-knee amputation right lower extremity. Status post partial first ray amputation left lower extremity, stable.   Radiographic exam: Partial first ray amputation at the distal one third of the first metatarsal noted. There appeared to be sharp cortical edges at the osteotomy site. No osteolytic or periosteal  reaction noted. No evidence of osteomyelitis on radiographic damp. Calcification of the native arteries including the dorsalis pedis noted on radiographic exam. Accessory navicular ossicle also noted on AP view. Joint spaces appear to be preserved with no degenerative changes.  Assessment: #1 Status post partial first ray amputation left foot with sesamoidectomy, with dehiscence #2 ulceration fifth digit left foot secondary to diabetes mellitus #3 history of below-knee amputation right lower extremity   Plan of Care:  #1 Patient was evaluated. #2 Medically necessary excisional debridement including subcutaneous tissues performed to the ulceration of the fifth digit. Excisional debridement of all the necrotic nonviable tissue down to healthy bleeding viable tissue was performed with post-debridement measurements and was pre-. #3 the wound was cleansed with normal saline and dry sterile dressing applied #4 today a postoperative shoe was dispensed. #5 continue routine evaluation appointments here in the office every 2 weeks until he is able to get to the [facility] wound care center. #6 continue daily dressing changes at home  Edrick Kins, DPM [facility]  Dr. Edrick Kins, [place]                                        [place], [place] [address]                Office [phone]  Fax [phone]

## 2016-06-11 ENCOUNTER — Encounter (HOSPITAL_BASED_OUTPATIENT_CLINIC_OR_DEPARTMENT_OTHER): Payer: Medicare Other | Attending: Surgery

## 2016-06-13 NOTE — Telephone Encounter (Signed)
Radiology review dictated on last office visit note.Thanks, Dr. Amalia Hailey.

## 2016-06-17 ENCOUNTER — Ambulatory Visit (INDEPENDENT_AMBULATORY_CARE_PROVIDER_SITE_OTHER): Payer: Medicare Other | Admitting: Podiatry

## 2016-06-17 DIAGNOSIS — L97522 Non-pressure chronic ulcer of other part of left foot with fat layer exposed: Secondary | ICD-10-CM | POA: Diagnosis not present

## 2016-06-17 DIAGNOSIS — I70245 Atherosclerosis of native arteries of left leg with ulceration of other part of foot: Secondary | ICD-10-CM

## 2016-06-17 NOTE — Progress Notes (Signed)
   Subjective:  Patient with a history of diabetes mellitus presents today for follow-up evaluation of ulceration to the left foot. Patient has a history of a partial first ray amputation site left foot. Below-knee amputation right foot. Patient was referred to the [facility] wound care center but he states that he could not afford treatment there. Patient presents today for further treatment and evaluation   Objective/Physical Exam General: The patient is alert and oriented x3 in no acute distress.  Dermatology:  Ulceration noted to the lateral aspect of the fifth digit left foot measuring [phone] cm (LxWxD). To the noted ulceration there is no overlying superficial eschar. Upon debridement of the eschar, there is a minimal amount of slough fibrin and necrotic tissue noted. Granulation tissue and wound base is red. There is minimal amount of serosanguineous drainage noted. Periwound integrity is intact.  Surgical amputation incision dehiscence has healed. Complete reepithelialization has occurred.  Vascular: Palpable pedal pulses left lower extremity.   Neurological: Epicritic and protective thresholdabsent  left.   Musculoskeletal Exam: Below-knee amputation right lower extremity. Status post partial first ray amputation left lower extremity, stable.    Assessment: #1 Status post partial first ray amputation left foot with sesamoidectomy - complete healing noted #2 ulceration fifth digit left foot secondary to diabetes mellitus #3 history of below-knee amputation right lower extremity   Plan of Care:  #1 Patient was evaluated. #2 Medically necessary excisional debridement including subcutaneous tissues performed to the ulceration of the fifth digit. Excisional debridement of all the necrotic nonviable tissue down to healthy bleeding viable tissue was performed with post-debridement measurements and was pre-. #3 the wound was cleansed with normal saline and dry sterile dressing  applied #4 continue daily dressing changes at home.  #5 today authorization for diabetic shoes was initiated today. Patient will require diabetic shoes with a left toe filler to the partial first ray amputation. #6 recommend over-the-counter foot cream #7 return to clinic in 4 weeks  Edrick Kins, DPM [facility]  Dr. Edrick Kins, [address]                                        [place], [place] [address]                Office [phone]  Fax [phone]

## 2016-07-15 ENCOUNTER — Ambulatory Visit: Payer: Medicare Other | Admitting: Podiatry

## 2016-07-22 ENCOUNTER — Ambulatory Visit: Payer: Medicare Other | Admitting: Podiatry

## 2016-09-15 ENCOUNTER — Encounter (HOSPITAL_COMMUNITY): Payer: Self-pay | Admitting: Emergency Medicine

## 2016-09-15 ENCOUNTER — Emergency Department (HOSPITAL_COMMUNITY)
Admission: EM | Admit: 2016-09-15 | Discharge: 2016-09-15 | Disposition: A | Discharge status: Home / Self Care | Payer: Medicare Other | Attending: Emergency Medicine | Admitting: Emergency Medicine

## 2016-09-15 DIAGNOSIS — Y999 Unspecified external cause status: Secondary | ICD-10-CM | POA: Diagnosis not present

## 2016-09-15 DIAGNOSIS — I2510 Atherosclerotic heart disease of native coronary artery without angina pectoris: Secondary | ICD-10-CM | POA: Diagnosis not present

## 2016-09-15 DIAGNOSIS — Z955 Presence of coronary angioplasty implant and graft: Secondary | ICD-10-CM | POA: Diagnosis not present

## 2016-09-15 DIAGNOSIS — S3992XA Unspecified injury of lower back, initial encounter: Secondary | ICD-10-CM | POA: Diagnosis present

## 2016-09-15 DIAGNOSIS — E039 Hypothyroidism, unspecified: Secondary | ICD-10-CM | POA: Diagnosis not present

## 2016-09-15 DIAGNOSIS — S39012A Strain of muscle, fascia and tendon of lower back, initial encounter: Secondary | ICD-10-CM | POA: Diagnosis not present

## 2016-09-15 DIAGNOSIS — Z79899 Other long term (current) drug therapy: Secondary | ICD-10-CM | POA: Insufficient documentation

## 2016-09-15 DIAGNOSIS — Z96651 Presence of right artificial knee joint: Secondary | ICD-10-CM | POA: Diagnosis not present

## 2016-09-15 DIAGNOSIS — S29019A Strain of muscle and tendon of unspecified wall of thorax, initial encounter: Secondary | ICD-10-CM | POA: Diagnosis not present

## 2016-09-15 DIAGNOSIS — S29011A Strain of muscle and tendon of front wall of thorax, initial encounter: Secondary | ICD-10-CM

## 2016-09-15 DIAGNOSIS — E1140 Type 2 diabetes mellitus with diabetic neuropathy, unspecified: Secondary | ICD-10-CM | POA: Diagnosis not present

## 2016-09-15 DIAGNOSIS — Z794 Long term (current) use of insulin: Secondary | ICD-10-CM | POA: Diagnosis not present

## 2016-09-15 DIAGNOSIS — Y92481 Parking lot as the place of occurrence of the external cause: Secondary | ICD-10-CM | POA: Insufficient documentation

## 2016-09-15 DIAGNOSIS — Z85528 Personal history of other malignant neoplasm of kidney: Secondary | ICD-10-CM | POA: Diagnosis not present

## 2016-09-15 DIAGNOSIS — J45909 Unspecified asthma, uncomplicated: Secondary | ICD-10-CM | POA: Diagnosis not present

## 2016-09-15 DIAGNOSIS — Y9389 Activity, other specified: Secondary | ICD-10-CM | POA: Insufficient documentation

## 2016-09-15 DIAGNOSIS — I120 Hypertensive chronic kidney disease with stage 5 chronic kidney disease or end stage renal disease: Secondary | ICD-10-CM | POA: Insufficient documentation

## 2016-09-15 DIAGNOSIS — N186 End stage renal disease: Secondary | ICD-10-CM | POA: Diagnosis not present

## 2016-09-15 DIAGNOSIS — E1122 Type 2 diabetes mellitus with diabetic chronic kidney disease: Secondary | ICD-10-CM | POA: Diagnosis not present

## 2016-09-15 MED ORDER — CYCLOBENZAPRINE HCL 10 MG PO TABS: 10.0000 mg | ORAL_TABLET | Freq: Two times a day (BID) | ORAL | 0 refills | Status: DC | PRN
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
## 2016-10-28 ENCOUNTER — Ambulatory Visit (INDEPENDENT_AMBULATORY_CARE_PROVIDER_SITE_OTHER): Payer: Medicare Other | Admitting: Podiatry

## 2016-10-28 ENCOUNTER — Ambulatory Visit (INDEPENDENT_AMBULATORY_CARE_PROVIDER_SITE_OTHER): Payer: Medicare Other

## 2016-10-28 DIAGNOSIS — Z9889 Other specified postprocedural states: Secondary | ICD-10-CM | POA: Diagnosis not present

## 2016-10-28 DIAGNOSIS — E11610 Type 2 diabetes mellitus with diabetic neuropathic arthropathy: Secondary | ICD-10-CM

## 2016-10-28 DIAGNOSIS — M19072 Primary osteoarthritis, left ankle and foot: Secondary | ICD-10-CM

## 2016-10-28 NOTE — Progress Notes (Signed)
   HPI: Patient with a history of diabetes mellitus and right below-knee amputation presents today for concern regarding the left foot. Patient states that they noticed about a month ago that the toes were curling under with some moderate swelling. Patient presents today for further treatment and evaluation. Patient denies trauma. Patient also denies pain.   Physical Exam: General: The patient is alert and oriented x3 in no acute distress.  Dermatology: Skin is warm, dry and supple left lower extremities. Negative for open lesions or macerations.  Vascular: There is moderate edema noted to the left lower extremity localized to the digits 2-5 of the right foot..  Neurological: Epicritic and protective threshold grossly intact bilaterally.   Musculoskeletal Exam: Evidence of partial first ray amputation the left foot which appears to be healed uneventfully. Mild hammertoe contracture digits 2-5 of the left foot also noted.  Radiographic Exam:  Significant degenerative changes of the metatarsal heads 2-5 of the left foot. There is osseous erosion with a oblique fracture of the fifth metatarsal neck. Comparative x-rays taken in February 2018 are negative for any osseous erosion. Rule out possible Charcot neuroarthropathy vs. Osteomyelitis (chronic)  Assessment: 1. Status post partial first ray amputation left foot 2. Below-knee amputation right lower extremity 3. Degenerative changes of the metatarsal heads left foot   Plan of Care:  1. Patient was evaluated. Today x-rays were reviewed 2. Today we'll place the patient in an immobilization cam boot for the left lower extremity. The patient can be weightbearing and immobilization cam boot. It will be near impossible for the patient to be nonweightbearing due to history of right below-knee amputation 3. Today the patient was also molded with Liliane Channel, Pedorthist for diabetic shoes with Plastizote inserts and a toe filler 4. Return to clinic in 4  weeks for follow-up x-ray   Edrick Kins, DPM [facility]  Dr. Edrick Kins, DPM    [address], [place] [address]                Office [phone]  Fax [phone]

## 2016-10-29 ENCOUNTER — Encounter (INDEPENDENT_AMBULATORY_CARE_PROVIDER_SITE_OTHER): Payer: Self-pay | Admitting: Orthopedic Surgery

## 2016-10-29 ENCOUNTER — Ambulatory Visit (INDEPENDENT_AMBULATORY_CARE_PROVIDER_SITE_OTHER): Payer: Medicare Other | Admitting: Orthopedic Surgery

## 2016-10-29 VITALS — Ht 68.0 in | Wt 248.0 lb

## 2016-10-29 DIAGNOSIS — Z89511 Acquired absence of right leg below knee: Secondary | ICD-10-CM | POA: Insufficient documentation

## 2016-10-29 DIAGNOSIS — M14672 Charcot's joint, left ankle and foot: Secondary | ICD-10-CM

## 2016-10-29 DIAGNOSIS — E1142 Type 2 diabetes mellitus with diabetic polyneuropathy: Secondary | ICD-10-CM | POA: Diagnosis not present

## 2016-10-29 NOTE — Progress Notes (Signed)
Office Visit Note   Patient: Tim Becker           Date of Birth: May 20, 1965           MRN: [id number] Visit Date: 10/29/2016              Requested by: Leeroy Cha, MD [address] [place], [place] [address] PCP: Leeroy Cha, MD  Chief Complaint  Patient presents with  . Right Leg - Wound Check    Prosthetic eval. Volume loss in limb causing rubbing and irritation of skin. [employer] to fabricate new socket and liners.       HPI: Patient is a 51 year old gentleman who moved here from [place]. Patient underwent ankle fusion in [facility] subsequently developed infection from the hardware and underwent a transtibial imitation the right. Patient most recently has developed Charcot changes of the forefoot is currently in a fracture boot treated by Dr. Amalia Hailey.  Assessment & Plan: Visit Diagnoses:  1. Charcot's joint, left ankle and foot   2. History of right below knee amputation ([place])   3. Diabetic polyneuropathy associated with type 2 diabetes mellitus ([place])     Plan: Prescriptions written for a new prosthesis socket liner materials and supplies for right BKA. Patient is transitioning from biotech to [place] per his request.  Follow-Up Instructions: Return if symptoms worsen or fail to improve.   Ortho Exam  Patient is alert, oriented, no adenopathy, well-dressed, normal affect, normal respiratory effort. Examination patient has an antalgic gait. His left foot is plantigrade he is in a fracture boot his radiographs were reviewed showing a Charcot collapse through the metatarsal necks status post first ray amputation on the left. Examination the right transtibial amputation is significant decreased volume he is currently wearing over 20 ply sock with his test liner. Patient does have a soft residual limb with no ulcers he does have callus over the patella from an bearing with the loose fitting prosthesis. Patient does not have rotational stability and  is at risk of falling.  Imaging: Dg Foot Complete Left  Result Date: 10/28/2016 Please see detailed radiograph report in office note.   Labs: Lab Results  Component Value Date   HGBA1C 8.6 (H) 04/16/2016   ESRSEDRATE 74 (H) 04/15/2016   ESRSEDRATE 127 (H) 08/10/2014   CRP 13.3 (H) 04/15/2016   CRP 8.9 (H) 08/10/2014   REPTSTATUS 04/24/2016 FINAL 04/16/2016   GRAMSTAIN  04/16/2016    RARE WBC PRESENT, PREDOMINANTLY PMN RARE GRAM NEGATIVE RODS    CULT  04/16/2016    FEW METHICILLIN RESISTANT STAPHYLOCOCCUS AUREUS FEW MORAXELLA SPECIES BETA LACTAMASE POSITIVE NO ANAEROBES ISOLATED    LABORGA METHICILLIN RESISTANT STAPHYLOCOCCUS AUREUS 04/16/2016    Orders:  No orders of the defined types were placed in this encounter.  No orders of the defined types were placed in this encounter.    Procedures: No procedures performed  Clinical Data: No additional findings.  ROS:  All other systems negative, except as noted in the HPI. Review of Systems  Objective: Vital Signs: Ht 5\' 8"  (1.727 m)   Wt 248 lb (112.5 kg)   BMI 37.71 kg/m   Specialty Comments:  No specialty comments available.  PMFS History: Patient Active Problem List   Diagnosis Date Noted  . Charcot's joint, left ankle and foot 10/29/2016  . History of right below knee amputation ([place]) 10/29/2016  . Diabetic polyneuropathy associated with type 2 diabetes mellitus ([place]) 10/29/2016  . ESRD (end stage renal disease)  on dialysis ([place]) 04/15/2016  . Chronic osteomyelitis of toe of left foot ([place]) 04/15/2016  . Left renal mass 07/11/2015  . Biliary calculi 09/14/2014  . Hemodialysis-associated hypotension 09/13/2014  . H/O amputation of leg through tibia and fibula ([place]) 09/08/2014  . Anemia associated with chronic renal failure 09/06/2014  . Type 2 diabetes mellitus with diabetic neuropathy ([place]) 08/07/2014  . Essential hypertension 08/07/2014  . Obesity 08/07/2014  . Hypothyroidism 08/07/2014  .  Kidney lump 04/06/2014  . Obstructive apnea 04/06/2014  . Diabetes mellitus ([place]) 03/23/2014  . Patient awaiting renal transplant 03/23/2014  . Asthma, moderate persistent 10/28/2013  . Anemia due to blood loss 09/14/2013  . Morbid obesity ([place]) 09/11/2013  . Morbid (severe) obesity due to excess calories ([place]) 09/11/2013  . Chronic kidney disease, stage IV (severe) ([place]) 09/10/2013  . Type 2 diabetes mellitus ([place]) 09/10/2013  . Mild persistent asthma with acute exacerbation 09/10/2013  . Abnormal ECG 05/07/2013  . CAD in native artery 05/07/2013   Past Medical History:  Diagnosis Date  . Anemia   . Arthritis   . Cancer [facility])    Renal Tumor  . CKD (chronic kidney disease)   . Coronary artery disease   . Diabetes ([place])   . DM (diabetes mellitus) with complications ([place])   . ESRD (end stage renal disease) ([place])    Pre- dialysis  . Hypertension   . Left kidney mass   . Neuropathy   . Osteomyelitis, chronic, ankle or foot ([place])   . PONV (postoperative nausea and vomiting)   . Renal insufficiency    Patient is on Dialysis and receives M,W and F.  . S/P BKA (below knee amputation) [facility]) June 2016   Right , [place], [place]  . Sleep apnea    CPAP  . Thyroid disease     Family History  Problem Relation Age of Onset  . Diabetes Mother   . Kidney disease Mother   . Breast cancer Mother     Past Surgical History:  Procedure Laterality Date  . BASCILIC VEIN TRANSPOSITION Left 02/17/2015   Procedure: BASCILIC VEIN TRANSPOSITION;  Surgeon: Katha Cabal, MD;  Location: [facility] ORS;  Service: Vascular;  Laterality: Left;  . BELOW KNEE LEG AMPUTATION Right   . CORONARY ANGIOPLASTY WITH STENT PLACEMENT    . EYE SURGERY    . FOOT SURGERY     Multiple R foot surgery for infection and charcot foot  . I&D EXTREMITY Left 04/16/2016   Procedure: IRRIGATION AND DEBRIDEMENT EXTREMITY/GREAT TOE AMP.;  Surgeon: Edrick Kins, DPM;  Location: [facility];  Service: Podiatry;  Laterality: Left;   . JOINT REPLACEMENT    . LAPAROSCOPIC NEPHRECTOMY, HAND ASSISTED Left 07/11/2015   Procedure: HAND ASSISTED LAPAROSCOPIC NEPHRECTOMY;  Surgeon: Hollice Espy, MD;  Location: [facility] ORS;  Service: Urology;  Laterality: Left;  . PERIPHERAL VASCULAR CATHETERIZATION Left 12/06/2014   Procedure: A/V Shuntogram/Fistulagram;  Surgeon: Katha Cabal, MD;  Location: [facility];  Service: Cardiovascular;  Laterality: Left;  . PERIPHERAL VASCULAR CATHETERIZATION Left 12/06/2014   Procedure: A/V Shunt Intervention;  Surgeon: Katha Cabal, MD;  Location: [facility];  Service: Cardiovascular;  Laterality: Left;  . TONSILLECTOMY     Social History   Occupational History  . Not on file.   Social History Main Topics  . Smoking status: Never Smoker  . Smokeless tobacco: Never Used  . Alcohol use No  . Drug use: No  . Sexual activity: Not on  file

## 2016-11-13 ENCOUNTER — Telehealth: Payer: Self-pay | Admitting: Orthotics

## 2016-11-20 NOTE — Addendum Note (Signed)
Addendum  created 11/20/16 1126 by Albertha Ghee, MD   Sign clinical note

## 2016-11-25 ENCOUNTER — Ambulatory Visit (INDEPENDENT_AMBULATORY_CARE_PROVIDER_SITE_OTHER): Payer: Medicare Other | Admitting: Podiatry

## 2016-11-25 ENCOUNTER — Ambulatory Visit (INDEPENDENT_AMBULATORY_CARE_PROVIDER_SITE_OTHER): Payer: Medicare Other

## 2016-11-25 DIAGNOSIS — Z9889 Other specified postprocedural states: Secondary | ICD-10-CM

## 2016-11-25 DIAGNOSIS — E11610 Type 2 diabetes mellitus with diabetic neuropathic arthropathy: Secondary | ICD-10-CM | POA: Diagnosis not present

## 2016-11-25 NOTE — Progress Notes (Signed)
   HPI:  51 year old male with a history of diabetes mellitus and right below-knee amputation presents to the office today for follow-up evaluation regarding Charcot arthropathy to the left forefoot. X-rays taken last visit on 10/28/2016 were consistent with Charcot degenerative changes to the metatarsal heads 2-5 to the left foot. Patient presents today for follow-up treatment and evaluation. Patient has no new complaints at this time   Physical Exam: General: The patient is alert and oriented x3 in no acute distress.  Dermatology: Skin is warm, dry and supple left lower extremities. Negative for open lesions or macerations.  Vascular: There is moderate edema noted to the left lower extremity localized to the digits 2-5 of the right foot..  Neurological: Epicritic and protective threshold grossly intact bilaterally.   Musculoskeletal Exam: Evidence of partial first ray amputation the left foot which appears to be healed uneventfully. Mild hammertoe contracture digits 2-5 of the left foot also noted.  Radiographic Exam:  Significant degenerative changes of the metatarsal heads 2-5 of the left foot  With routine healing and improvement compared to last x-rays taken on 10/28/2016. There is osseous erosion with a oblique fracture of the fifth metatarsal neck.  Overall x-rays appear to be greatly improved over the past month likely consistent with Charcot neuroarthropathy  Assessment: 1. Status post partial first ray amputation left foot 2. Below-knee amputation right lower extremity 3.  Charcot arthropathy left forefoot   Plan of Care:  1. Patient was evaluated. Today x-rays were reviewed 2.  Continue good shoe gear and to maintain healthy diabetic lifestyle. Continue ambulating in immobilization cam boot. Today the patient required an additional cam boot for immobilizationbecause the last one he received was falling apart. Short cam boot dispensed today  3. Diabetic insoles still pending from  [place], [place]  4. Return to clinic in 6 weeks  Edrick Kins, DPM [facility]  Dr. Edrick Kins, DPM    [address], [place] [address]                Office [phone]  Fax [phone]

## 2016-11-28 ENCOUNTER — Telehealth (INDEPENDENT_AMBULATORY_CARE_PROVIDER_SITE_OTHER): Payer: Self-pay | Admitting: Orthopedic Surgery

## 2016-11-28 NOTE — Telephone Encounter (Signed)
10/29/2016 ov note faxed to [facility]

## 2016-12-11 NOTE — Telephone Encounter (Signed)
Left message w/ patient to find out PCP.

## 2016-12-12 ENCOUNTER — Telehealth: Payer: Self-pay | Admitting: Podiatry

## 2016-12-12 NOTE — Telephone Encounter (Signed)
Spoke to pt reguarding his diabetic shoes we have ordered. They are on back order until 1.14.19 and asked pt if he would be ok with [employer] instead of black and pt said ok to change from black to [employer].

## 2016-12-15 ENCOUNTER — Encounter (HOSPITAL_COMMUNITY): Payer: Self-pay | Admitting: *Deleted

## 2016-12-15 ENCOUNTER — Emergency Department (HOSPITAL_COMMUNITY): Payer: Medicare Other

## 2016-12-15 ENCOUNTER — Emergency Department (HOSPITAL_COMMUNITY)
Admission: EM | Admit: 2016-12-15 | Discharge: 2016-12-15 | Disposition: A | Discharge status: Home / Self Care | Payer: Medicare Other | Attending: Emergency Medicine | Admitting: Emergency Medicine

## 2016-12-15 DIAGNOSIS — Z794 Long term (current) use of insulin: Secondary | ICD-10-CM | POA: Diagnosis not present

## 2016-12-15 DIAGNOSIS — I2510 Atherosclerotic heart disease of native coronary artery without angina pectoris: Secondary | ICD-10-CM | POA: Insufficient documentation

## 2016-12-15 DIAGNOSIS — N186 End stage renal disease: Secondary | ICD-10-CM | POA: Diagnosis not present

## 2016-12-15 DIAGNOSIS — I10 Essential (primary) hypertension: Secondary | ICD-10-CM

## 2016-12-15 DIAGNOSIS — Z79899 Other long term (current) drug therapy: Secondary | ICD-10-CM | POA: Diagnosis not present

## 2016-12-15 DIAGNOSIS — E039 Hypothyroidism, unspecified: Secondary | ICD-10-CM | POA: Insufficient documentation

## 2016-12-15 DIAGNOSIS — Z85528 Personal history of other malignant neoplasm of kidney: Secondary | ICD-10-CM | POA: Insufficient documentation

## 2016-12-15 DIAGNOSIS — Z955 Presence of coronary angioplasty implant and graft: Secondary | ICD-10-CM | POA: Diagnosis not present

## 2016-12-15 DIAGNOSIS — I120 Hypertensive chronic kidney disease with stage 5 chronic kidney disease or end stage renal disease: Secondary | ICD-10-CM | POA: Insufficient documentation

## 2016-12-15 DIAGNOSIS — R079 Chest pain, unspecified: Secondary | ICD-10-CM

## 2016-12-15 DIAGNOSIS — J45909 Unspecified asthma, uncomplicated: Secondary | ICD-10-CM | POA: Diagnosis not present

## 2016-12-15 DIAGNOSIS — E1122 Type 2 diabetes mellitus with diabetic chronic kidney disease: Secondary | ICD-10-CM | POA: Insufficient documentation

## 2016-12-15 HISTORY — DX: Obesity, unspecified: E66.9

## 2016-12-15 LAB — BASIC METABOLIC PANEL
Anion gap: 8 (ref 5–15)
BUN: 57 mg/dL — ABNORMAL HIGH (ref 6–20)
CO2: 30 mmol/L (ref 22–32)
Calcium: 9.1 mg/dL (ref 8.9–10.3)
Chloride: 102 mmol/L (ref 101–111)
Creatinine, Ser: 11.93 mg/dL — ABNORMAL HIGH (ref 0.61–1.24)
GFR calc Af Amer: 5 mL/min — ABNORMAL LOW (ref >60)
GFR calc non Af Amer: 4 mL/min — ABNORMAL LOW (ref >60)
Glucose, Bld: 184 mg/dL — ABNORMAL HIGH (ref 65–99)
Potassium: 4.7 mmol/L (ref 3.5–5.1)
Sodium: 140 mmol/L (ref 135–145)

## 2016-12-15 LAB — CBC
HCT: 36.9 % — ABNORMAL LOW (ref 39.0–52.0)
Hemoglobin: 11.1 g/dL — ABNORMAL LOW (ref 13.0–17.0)
MCH: 28.4 pg (ref 26.0–34.0)
MCHC: 30.1 g/dL (ref 30.0–36.0)
MCV: 94.4 fL (ref 78.0–100.0)
Platelets: 265 10*3/uL (ref 150–400)
RBC: 3.91 MIL/uL — ABNORMAL LOW (ref 4.22–5.81)
RDW: 16.4 % — ABNORMAL HIGH (ref 11.5–15.5)
WBC: 8.2 10*3/uL (ref 4.0–10.5)

## 2016-12-15 LAB — I-STAT TROPONIN, ED
Troponin i, poc: 0.01 ng/mL (ref 0.00–0.08)
Troponin i, poc: 0 ng/mL (ref 0.00–0.08)

## 2016-12-15 MED ORDER — OMEPRAZOLE 20 MG PO CPDR: 20.0000 mg | DELAYED_RELEASE_CAPSULE | Freq: Every day | ORAL | 0 refills | Status: DC

## 2016-12-15 NOTE — Discharge Instructions (Signed)
Read instructions below for reasons to return to the Emergency Department. It is recommended that your follow up with your Primary Care Doctor in regards to today's visit. If you do not have a doctor, use the resource guide listed below to help you find one.  Begin taking Prilosec (omeprazole) as directed.   Tests performed today include: An EKG of your heart A chest x-ray Cardiac enzymes - a blood test for heart muscle damage. This was done x 2 Blood counts and electrolytes Vital signs. See below for your results today. (your blood pressure improved while in the department)  Chest Pain (Nonspecific)  HOME CARE INSTRUCTIONS  For the next few days, avoid physical activities that bring on chest pain. Continue physical activities as directed.  Do not smoke cigarettes or drink alcohol until your symptoms are gone. If you do smoke, it is time to quit. You may receive instructions and counseling on how to stop smoking. Only take over-the-counter or prescription medicine for pain, discomfort, or fever as directed by your caregiver.  Follow your caregiver's suggestions for further testing if your chest pain does not go away.  Keep any follow-up appointments you made. If you do not go to an appointment, you could develop lasting (chronic) problems with pain. If there is any problem keeping an appointment, you must call to reschedule.  SEEK MEDICAL CARE IF:  You think you are having problems from the medicine you are taking. Read your medicine instructions carefully.  Your chest pain does not go away, even after treatment.  You develop a rash with blisters on your chest.  SEEK IMMEDIATE MEDICAL CARE IF:  You have increased chest pain or pain that spreads to your arm, neck, jaw, back, or belly (abdomen).  You develop shortness of breath, an increasing cough, or you are coughing up blood.  You have severe back or abdominal pain, feel sick to your stomach (nauseous) or throw up (vomit).  You develop severe  weakness, fainting, or chills.  You have an oral temperature above 102 F (38.9 C), not controlled by medicine.  THIS IS AN EMERGENCY. Do not wait to see if the pain will go away. Get medical help at once. Call your local emergency services (911 in [place] Do not drive yourself to the hospital. Additional Information:  Your vital signs today were: BP (!) 177/77    Pulse 71    Temp 98.3 F (36.8 C) (Oral)    Resp 12    SpO2 96%  If your blood pressure (BP) was elevated above 135/85 this visit, please have this repeated by your doctor within one month. ---------------

## 2016-12-15 NOTE — ED Provider Notes (Signed)
[facility] DEPT Provider Note   CSN: [id number] Arrival date & time: 12/15/16  1217     History   Chief Complaint Chief Complaint  Patient presents with  . Chest Pain    HPI Levert Heslop is a 51 y.o. male with a history of ESRD on hemodialysis, HTN, obesity, hypothyroidism, DM2, anemia, OSA on CPAP who presents to the ED for multiple complaints including chest pain, elevated BP and b/l shoulder numbness. Patient is on M/W/F dialysis at [facility]. Last dialysis Friday.   Patient states that he has noticed his systolic BP has been over 200 for > 1 month now whenever he goes to dialysis. He takes Coreg 12.5mg  BID for this. Never misses a dose. He states that during his last appt at the dialysis center he was told to keep a log of his blood pressures at home. He notes that they have all been ~190-200/80-20mmHg with home cuff. When patient presented his BP was 209/83. While in room BP is 176/77 and 171/75.   The patient notes that he has been having intermittent CP over the last month. This commonly occurs 2-3 times per week. He states that he is usually always sitting when the chest pain occurs. The chest pain is located centrally with out radiation to left arm, jaw or back. He report no associated SOB, nausea, or diaphoresis. The patient described the pain as a central chest pain that is either sharp or "like indigestion, just really tight". He states the pain always lasts <5 minutes. He has tried generic Copywriter, advertising for this with mild relief for this. He says that the pain typically comes on during the days before dialysis and is better after he get's dialyzed. He also notes episodes after foods like split bean soup. He denies any cough, orthopnea or lower leg swelling but the patient is an amputee of the right leg.   The patient reports over the last month he has had intermittent numbness to b/l shoulder to mid arm.  He states that the numbness comes on 1-2 times per week since  onset. He feels this occurs when his BP is high. The numbness does not start in the neck. It lasts ~5 minutes. It is not assoicated with HA, focal weakness, CP, SOB, palpitations, melena, diaphoresis, unilateral neck pain, unilateral weakness, facial asymmetry, difficulty with speech, change in gait, LOC, N/V. No trauma.  HPI  Past Medical History:  Diagnosis Date  . Anemia   . Arthritis   . Cancer [facility])    Renal Tumor  . CKD (chronic kidney disease)   . Coronary artery disease   . Diabetes ([place])   . DM (diabetes mellitus) with complications ([place])   . ESRD (end stage renal disease) ([place])    Pre- dialysis  . Hypertension   . Left kidney mass   . Neuropathy   . Obesity   . Osteomyelitis, chronic, ankle or foot ([place])   . PONV (postoperative nausea and vomiting)   . Renal insufficiency    Patient is on Dialysis and receives M,W and F.  . S/P BKA (below knee amputation) [facility]) June 2016   Right , [place], [place]  . Sleep apnea    CPAP  . Thyroid disease     Patient Active Problem List   Diagnosis Date Noted  . Charcot's joint, left ankle and foot 10/29/2016  . History of right below knee amputation ([place]) 10/29/2016  . Diabetic polyneuropathy associated with type 2 diabetes mellitus ([place]) 10/29/2016  .  ESRD (end stage renal disease) on dialysis ([place]) 04/15/2016  . Chronic osteomyelitis of toe of left foot ([place]) 04/15/2016  . Left renal mass 07/11/2015  . Biliary calculi 09/14/2014  . Hemodialysis-associated hypotension 09/13/2014  . H/O amputation of leg through tibia and fibula ([place]) 09/08/2014  . Anemia associated with chronic renal failure 09/06/2014  . Type 2 diabetes mellitus with diabetic neuropathy ([place]) 08/07/2014  . Essential hypertension 08/07/2014  . Obesity 08/07/2014  . Hypothyroidism 08/07/2014  . Kidney lump 04/06/2014  . Obstructive apnea 04/06/2014  . Diabetes mellitus ([place]) 03/23/2014  . Patient awaiting renal transplant 03/23/2014  . Asthma, moderate  persistent 10/28/2013  . Anemia due to blood loss 09/14/2013  . Morbid obesity ([place]) 09/11/2013  . Morbid (severe) obesity due to excess calories ([place]) 09/11/2013  . Chronic kidney disease, stage IV (severe) ([place]) 09/10/2013  . Type 2 diabetes mellitus ([place]) 09/10/2013  . Mild persistent asthma with acute exacerbation 09/10/2013  . Abnormal ECG 05/07/2013  . CAD in native artery 05/07/2013    Past Surgical History:  Procedure Laterality Date  . BASCILIC VEIN TRANSPOSITION Left 02/17/2015   Procedure: BASCILIC VEIN TRANSPOSITION;  Surgeon: Katha Cabal, MD;  Location: [facility] ORS;  Service: Vascular;  Laterality: Left;  . BELOW KNEE LEG AMPUTATION Right   . CORONARY ANGIOPLASTY WITH STENT PLACEMENT    . EYE SURGERY    . FOOT SURGERY     Multiple R foot surgery for infection and charcot foot  . I&D EXTREMITY Left 04/16/2016   Procedure: IRRIGATION AND DEBRIDEMENT EXTREMITY/GREAT TOE AMP.;  Surgeon: Edrick Kins, DPM;  Location: [facility];  Service: Podiatry;  Laterality: Left;  . JOINT REPLACEMENT    . LAPAROSCOPIC NEPHRECTOMY, HAND ASSISTED Left 07/11/2015   Procedure: HAND ASSISTED LAPAROSCOPIC NEPHRECTOMY;  Surgeon: Hollice Espy, MD;  Location: [facility] ORS;  Service: Urology;  Laterality: Left;  . PERIPHERAL VASCULAR CATHETERIZATION Left 12/06/2014   Procedure: A/V Shuntogram/Fistulagram;  Surgeon: Katha Cabal, MD;  Location: [facility];  Service: Cardiovascular;  Laterality: Left;  . PERIPHERAL VASCULAR CATHETERIZATION Left 12/06/2014   Procedure: A/V Shunt Intervention;  Surgeon: Katha Cabal, MD;  Location: [facility];  Service: Cardiovascular;  Laterality: Left;  . TONSILLECTOMY         Home Medications    Prior to Admission medications   Medication Sig Start Date End Date Taking? Authorizing Provider  acetaminophen (TYLENOL) 500 MG tablet Take 1,000 mg by mouth every 6 (six) hours as needed for headache (pain).   Yes [provider]    carvedilol (COREG) 12.5 MG tablet Take 12.5 mg by mouth 2 (two) times daily with a meal.   Yes [provider]  insulin glargine (LANTUS) 100 unit/mL SOPN Inject 80 Units into the skin daily before breakfast.   Yes [provider]  Insulin Lispro (HUMALOG KWIKPEN) 200 UNIT/ML SOPN Inject 10-20 Units into the skin See admin instructions. Inject 10-20 units (per sliding scale) two - three times daily with meals   Yes [provider]  latanoprost (XALATAN) 0.005 % ophthalmic solution Place 1 drop into both eyes at bedtime.   Yes [provider]  oxyCODONE-acetaminophen (PERCOCET/ROXICET) 5-325 MG tablet Take 1 tablet by mouth every 8 (eight) hours as needed for severe pain.   Yes [provider]  PRESCRIPTION MEDICATION Inhale into the lungs at bedtime. CPAP   Yes [provider]  sucroferric oxyhydroxide (VELPHORO) 500 MG chewable tablet Chew 1,500 mg by mouth See  admin instructions. Chew 3 tablets (1500 mg) by mouth two - three times daily with meals   Yes [provider]  ceFEPIme 2 g in dextrose 5 % 50 mL Inject 2 g into the vein every Monday, Wednesday, and Friday at 8 PM. Patient not taking: Reported on 12/15/2016 04/17/16   Debbe Odea, MD  cyclobenzaprine (FLEXERIL) 10 MG tablet Take 1 tablet (10 mg total) by mouth 2 (two) times daily as needed for muscle spasms. Patient not taking: Reported on 12/15/2016 09/15/16   Pattricia Boss, MD  HYDROcodone-acetaminophen (NORCO/VICODIN) 5-325 MG tablet Take 1-2 tablets by mouth every 4 (four) hours as needed for moderate pain. Patient not taking: Reported on 12/15/2016 04/17/16   Debbe Odea, MD  multivitamin (RENA-VIT) TABS tablet Take 1 tablet by mouth daily. Patient not taking: Reported on 12/15/2016 04/17/16   Debbe Odea, MD  omeprazole (PRILOSEC) 20 MG capsule Take 1 capsule (20 mg total) by mouth daily. 12/15/16   Ajia Chadderdon, Barth Kirks, PA-C  vancomycin (VANCOCIN) 1-5 GM/200ML-% SOLN Inject  200 mLs (1,000 mg total) into the vein every Monday, Wednesday, and Friday with hemodialysis. Patient not taking: Reported on 12/15/2016 04/19/16   Debbe Odea, MD    Family History Family History  Problem Relation Age of Onset  . Diabetes Mother   . Kidney disease Mother   . Breast cancer Mother     Social History Social History  Substance Use Topics  . Smoking status: Never Smoker  . Smokeless tobacco: Never Used  . Alcohol use No     Allergies   Patient has no known allergies.   Review of Systems Review of Systems  All other systems reviewed and are negative.    Physical Exam Updated Vital Signs BP (!) 177/77   Pulse 71   Temp 98.3 F (36.8 C) (Oral)   Resp 12   SpO2 96%   Physical Exam  Constitutional: He appears well-developed and well-nourished.  HENT:  Head: Normocephalic and atraumatic.  Right Ear: External ear normal.  Left Ear: External ear normal.  Nose: Nose normal.  Mouth/Throat: Uvula is midline, oropharynx is clear and moist and mucous membranes are normal. No tonsillar exudate.  Eyes: Pupils are equal, round, and reactive to light. Right eye exhibits no discharge. Left eye exhibits no discharge. No scleral icterus.  Neck: Trachea normal. Neck supple. No spinous process tenderness present. No neck rigidity. Normal range of motion present.  Cardiovascular: Normal rate, regular rhythm and intact distal pulses.   No murmur heard. Pulses:      Radial pulses are 2+ on the right side, and 2+ on the left side.       Dorsalis pedis pulses are 2+ on the left side.       Posterior tibial pulses are 2+ on the left side.  No lower extremity swelling or edema of left leg. Right leg amputee.   Pulmonary/Chest: Effort normal and breath sounds normal. No respiratory distress. He has no wheezes. He has no rales. He exhibits no tenderness.  Abdominal: Soft. Bowel sounds are normal. There is no tenderness. There is no rebound and no guarding.  Musculoskeletal:  He exhibits no edema.       Right shoulder: He exhibits normal range of motion, no tenderness and no swelling.       Left shoulder: He exhibits normal range of motion, no tenderness and no swelling.  Lymphadenopathy:    He has no cervical adenopathy.  Neurological: He is alert.  Speech clear. Follows  commands. No facial droop. PERRLA. EOMI. Normal peripheral fields. CN III-XII intact.  Grossly moves all extremities 4 without ataxia (noted amputee of RLL). Coordination intact. Able and appropriate strength for age to upper and lower extremities bilaterally including grip strength. Sensation to light touch intact bilaterally for upper and lower (with focus to UE). Normal finger to nose. Normal heel to shin balance. Negative Romberg. No pronator drift. Normal gait.   Skin: Skin is warm and dry. No rash noted. He is not diaphoretic.  Psychiatric: He has a normal mood and affect.  Nursing note and vitals reviewed.    ED Treatments / Results  Labs (all labs ordered are listed, but only abnormal results are displayed) Labs Reviewed  BASIC METABOLIC PANEL - Abnormal; Notable for the following:       Result Value   Glucose, Bld 184 (*)    BUN 57 (*)    Creatinine, Ser 11.93 (*)    GFR calc non Af Amer 4 (*)    GFR calc Af Amer 5 (*)    All other components within normal limits  CBC - Abnormal; Notable for the following:    RBC 3.91 (*)    Hemoglobin 11.1 (*)    HCT 36.9 (*)    RDW 16.4 (*)    All other components within normal limits  I-STAT TROPONIN, ED  I-STAT TROPONIN, ED    EKG  EKG Interpretation  Date/Time:  Sunday December 15 2016 12:22:36 EDT Ventricular Rate:  83 PR Interval:  160 QRS Duration: 84 QT Interval:  386 QTC Calculation: 453 R Axis:   76 Text Interpretation:  Normal sinus rhythm Septal infarct , age undetermined No significant change since last tracing Confirmed by Plunkett, Whitney ([id number]) on 12/15/2016 3:02:17 PM       Radiology Dg Chest 2  View  Result Date: 12/15/2016 CLINICAL DATA:  51 year old male with a history of hypertension right-sided chest pain EXAM: CHEST  2 VIEW COMPARISON:  02/15/2016 FINDINGS: Cardiomediastinal silhouette unchanged. No evidence of central vascular congestion. No pneumothorax or pleural effusion. No confluent airspace disease. No displaced fracture. Similar appearance of chronic interstitial opacities. IMPRESSION: Chronic lung changes without evidence of acute cardiopulmonary disease Electronically Signed   By: Jaime  Wagner D.O.   On: 12/15/2016 13:05    Procedures Procedures (including critical care time)  Medications Ordered in ED Medications - No data to display   Initial Impression / Assessment and Plan / ED Course  I have reviewed the triage vital signs and the nursing notes.  Pertinent labs & imaging results that were available during my care of the patient were reviewed by me and considered in my medical decision making (see chart for details).      51  y.o. male with a history of ESRD on hemodialysis (M/W/F dialysis at [facility] with last dialysis Friday) who presents complaints of chest pain and elevated BP. This has been ongoing issue for several weeks. Patient states that he has noticed his systolic BP has been over 200 for > 1 month now whenever he goes to dialysis. He takes Coreg 12.5mg  BID for this. Never misses a dose. He states that during his last appt at the dialysis center he was told to keep a log of his blood pressures at home. He notes that they have all been ~190-200/80-13mmHg with home cuff. When patient presented his BP was 209/83. While in room BP is 176/77 and 171/75. Patient CP has been intermittent CP over the last month. This  commonly occurs 2-3 times per week. He states that he is usually always sitting when the chest pain occurs. The chest pain is located centrally with out radiation to left arm, jaw or back. He report no associated SOB, nausea, or diaphoresis. The  patient described the pain as a central chest pain that is either sharp or "like indigestion, just really tight". He states the pain always lasts <5 minutes. He has tried generic Copywriter, advertising for this with mild relief for this. He says that the pain typically comes on during the days before dialysis and is better after he get's dialyzed. He also notes episodes after foods like split bean soup. He denies any cough, orthopnea or lower leg swelling but the patient is an amputee of the right leg. The patient reports over the last month he has had intermittent numbness to b/l shoulder to mid arm.  He states that the numbness comes on 1-2 times per week since onset. He feels this occurs when his BP is high. The numbness does not start in the neck. It lasts ~5 minutes. It is not assoicated with HA, focal weakness, CP, SOB, palpitations, melena, diaphoresis, unilateral neck pain, unilateral weakness, facial asymmetry, difficulty with speech, change in gait, LOC, N/V. No prior trauma.  On presentation the patient is chest pain-free and otherwise asymptomatic. Blood pressure taken in triage was noted to be 209/83 but on the room taking history blood pressures were improved to 176/77 and 171/75 as noted above. No medications given for this. Patient cardiopulmonary, lung and neuro exam are reassuring as noted in PE. Will cycle Tn, check EKG, CXR and basic labs. Will monitor BP to make sure it does not increase will in the department.    Patient troponin negative 2. BMP with not significantly worsening kidney function. Normal electrolytes values. No anion gap acidosis. CBC reassuring. Chest x-ray without acute cardiopulmonary disease, but chronic changes are noted. EKG without ischemic changes from prior. Patient remained chest pain-free in the department. Blood pressure remained similar as above.  Had a long conversation with the patient about consult cardiology and further workup versus follow with PCP on outpatient  basis. The patient says that he is comfortable following up on an outpatient basis for this and does not feel that he needs to be further worked up in the department today with reassuring workup as noted above. Patient is to be discharged with recommendation to follow up with PCP in regards to today's hospital visit. Chest pain is reassuring d/t presentation, perc negative, VSS, no tracheal deviation, no JVD or new murmur, RRR, breath sounds equal bilaterally, EKG without acute abnormalities, negative troponin x2, and negative CXR. Pt has been advised start a PPI trial for symptoms that somewhat resemble acid reflux and return to the ED is CP becomes exertional, associated with diaphoresis or nausea, radiates to left jaw/arm, worsens or becomes concerning in any way. Pt appears reliable for follow up and is agreeable to discharge.   Case has been discussed with and seen by Dr. Sherry Ruffing who agrees with the above plan to discharge.    Final Clinical Impressions(s) / ED Diagnoses   Final diagnoses:  Chest pain, unspecified type  Hypertension, unspecified type    New Prescriptions New Prescriptions   OMEPRAZOLE (PRILOSEC) 20 MG CAPSULE    Take 1 capsule (20 mg total) by mouth daily.     Jillyn Ledger, PA-C 12/17/16 1113    Tegeler, Gwenyth Allegra, MD 12/18/16 [phone]

## 2016-12-15 NOTE — ED Triage Notes (Signed)
Pt reports ongoing HTN and having right side chest pain for several days. Last dialysis was Friday. No sob, no resp distress is noted at triage and EKG done at triage.

## 2017-01-06 ENCOUNTER — Ambulatory Visit (INDEPENDENT_AMBULATORY_CARE_PROVIDER_SITE_OTHER): Payer: Medicare Other

## 2017-01-06 ENCOUNTER — Ambulatory Visit (INDEPENDENT_AMBULATORY_CARE_PROVIDER_SITE_OTHER): Payer: Medicare Other | Admitting: Podiatry

## 2017-01-06 DIAGNOSIS — E0842 Diabetes mellitus due to underlying condition with diabetic polyneuropathy: Secondary | ICD-10-CM

## 2017-01-06 DIAGNOSIS — E11610 Type 2 diabetes mellitus with diabetic neuropathic arthropathy: Secondary | ICD-10-CM | POA: Diagnosis not present

## 2017-01-07 NOTE — Progress Notes (Signed)
   HPI:  51 year old male with a history of diabetes mellitus and right below-knee amputation presents to the office today for follow-up evaluation regarding Charcot arthropathy to the left forefoot. He states he is doing well at this time. He has no new complaints. He is here for further evaluation and treatment.   Past Medical History:  Diagnosis Date  . Anemia   . Arthritis   . Cancer [facility])    Renal Tumor  . CKD (chronic kidney disease)   . Coronary artery disease   . Diabetes ([place])   . DM (diabetes mellitus) with complications ([place])   . ESRD (end stage renal disease) ([place])    Pre- dialysis  . Hypertension   . Left kidney mass   . Neuropathy   . Obesity   . Osteomyelitis, chronic, ankle or foot ([place])   . PONV (postoperative nausea and vomiting)   . Renal insufficiency    Patient is on Dialysis and receives M,W and F.  . S/P BKA (below knee amputation) [facility]) June 2016   Right , [place], [place]  . Sleep apnea    CPAP  . Thyroid disease       Physical Exam: General: The patient is alert and oriented x3 in no acute distress.  Dermatology: Skin is warm, dry and supple left lower extremities. Negative for open lesions or macerations.  Vascular: There is moderate edema noted to the left lower extremity localized to the digits 2-5 of the right foot..  Neurological: Epicritic and protective threshold grossly intact bilaterally.   Musculoskeletal Exam: Evidence of partial first ray amputation the left foot which appears to be healed uneventfully. Mild hammertoe contracture digits 2-5 of the left foot also noted.  Radiographic Exam:  Significant degenerative changes of the metatarsal heads 2-5 of the left foot  With routine healing and improvement compared to last x-rays taken on 10/28/2016. There is osseous erosion with a oblique fracture of the fifth metatarsal neck.  Overall x-rays appear to be greatly improved over the past month likely consistent with Charcot  neuroarthropathy  Assessment: 1. Status post partial first ray amputation left foot 2. Below-knee amputation right lower extremity 3. Charcot arthropathy left forefoot   Plan of Care:  1. Patient was evaluated. Today x-rays were reviewed 2. No significant changes noted to x-ray compared to previous x-ray. 3. Continue wearing DM shoes. 4. Return to clinic in 3 months.   Edrick Kins, DPM [facility]  Dr. Edrick Kins, DPM    [address], [place] [address]                Office [phone]  Fax [phone]

## 2017-01-14 ENCOUNTER — Ambulatory Visit: Payer: Medicare Other | Admitting: Orthotics

## 2017-01-22 ENCOUNTER — Ambulatory Visit (INDEPENDENT_AMBULATORY_CARE_PROVIDER_SITE_OTHER): Payer: Medicare Other | Admitting: Orthotics

## 2017-01-22 DIAGNOSIS — E11610 Type 2 diabetes mellitus with diabetic neuropathic arthropathy: Secondary | ICD-10-CM

## 2017-01-22 DIAGNOSIS — Z9889 Other specified postprocedural states: Secondary | ICD-10-CM

## 2017-01-22 DIAGNOSIS — T8131XD Disruption of external operation (surgical) wound, not elsewhere classified, subsequent encounter: Secondary | ICD-10-CM

## 2017-01-22 DIAGNOSIS — E0842 Diabetes mellitus due to underlying condition with diabetic polyneuropathy: Secondary | ICD-10-CM

## 2017-01-22 NOTE — Progress Notes (Signed)
Patient came in today to pick up diabetic shoes and toe filler LEFT.  Patient was satisfied with fit and function.

## 2017-02-11 ENCOUNTER — Ambulatory Visit
Admission: RE | Admit: 2017-02-11 | Discharge: 2017-02-11 | Disposition: A | Discharge status: Home / Self Care | Payer: Medicare Other | Source: Ambulatory Visit | Attending: Urology | Admitting: Urology

## 2017-02-11 DIAGNOSIS — I700 Atherosclerosis of aorta: Secondary | ICD-10-CM | POA: Diagnosis not present

## 2017-02-11 DIAGNOSIS — K8020 Calculus of gallbladder without cholecystitis without obstruction: Secondary | ICD-10-CM | POA: Insufficient documentation

## 2017-02-11 DIAGNOSIS — I2510 Atherosclerotic heart disease of native coronary artery without angina pectoris: Secondary | ICD-10-CM | POA: Insufficient documentation

## 2017-02-11 DIAGNOSIS — Z905 Acquired absence of kidney: Secondary | ICD-10-CM | POA: Diagnosis not present

## 2017-02-11 DIAGNOSIS — Z85528 Personal history of other malignant neoplasm of kidney: Secondary | ICD-10-CM | POA: Insufficient documentation

## 2017-02-11 MED ORDER — IOPAMIDOL (ISOVUE-300) INJECTION 61%
100.0000 mL | Freq: Once | INTRAVENOUS | Status: AC | PRN
  Administered 2017-02-11: 100 mL via INTRAVENOUS

## 2017-02-12 ENCOUNTER — Ambulatory Visit: Payer: Medicare Other | Admitting: Urology

## 2017-02-18 ENCOUNTER — Telehealth: Payer: Self-pay | Admitting: Urology

## 2017-02-18 ENCOUNTER — Ambulatory Visit: Payer: Medicare Other | Admitting: Urology

## 2017-02-18 NOTE — Telephone Encounter (Signed)
Pt cx said he would cb later to reschd  Tim Becker

## 2017-02-19 ENCOUNTER — Ambulatory Visit: Payer: Medicare Other | Admitting: Urology

## 2017-04-09 ENCOUNTER — Ambulatory Visit: Payer: Medicare Other | Admitting: Podiatry

## 2017-04-16 ENCOUNTER — Ambulatory Visit: Payer: Medicare Other | Admitting: Podiatry

## 2017-04-16 ENCOUNTER — Ambulatory Visit (INDEPENDENT_AMBULATORY_CARE_PROVIDER_SITE_OTHER): Payer: Medicare Other

## 2017-04-16 DIAGNOSIS — E11610 Type 2 diabetes mellitus with diabetic neuropathic arthropathy: Secondary | ICD-10-CM | POA: Diagnosis not present

## 2017-04-20 NOTE — Progress Notes (Signed)
   HPI:  52 year old male with a history of diabetes mellitus and right below-knee amputation presents to the office today for follow-up evaluation regarding Charcot arthropathy to the left forefoot. He states he is doing well at this time. He has no new complaints. Patient is here for further evaluation and treatment.   Past Medical History:  Diagnosis Date  . Anemia   . Arthritis   . Cancer [facility])    Renal Tumor  . CKD (chronic kidney disease)   . Coronary artery disease   . Diabetes ([place])   . DM (diabetes mellitus) with complications ([place])   . ESRD (end stage renal disease) ([place])    Pre- dialysis  . Hypertension   . Left kidney mass   . Neuropathy   . Obesity   . Osteomyelitis, chronic, ankle or foot ([place])   . PONV (postoperative nausea and vomiting)   . Renal insufficiency    Patient is on Dialysis and receives M,W and F.  . S/P BKA (below knee amputation) [facility]) June 2016   Right , [place], [place]  . Sleep apnea    CPAP  . Thyroid disease       Physical Exam: General: The patient is alert and oriented x3 in no acute distress.  Dermatology: Skin is warm, dry and supple left lower extremities. Negative for open lesions or macerations.  Vascular: There is moderate edema noted to the left lower extremity localized to the digits 2-5 of the right foot..  Neurological: Epicritic and protective threshold grossly intact bilaterally.   Musculoskeletal Exam: Evidence of partial first ray amputation the left foot which appears to be healed uneventfully. Mild hammertoe contracture digits 2-5 of the left foot also noted.  Radiographic Exam:  Significant degenerative changes of the metatarsal heads 2-5 of the left foot with routine healing and improvement compared to last x-rays taken on 10/28/2016. There is osseous erosion with a oblique fracture of the fifth metatarsal neck.  Overall x-rays appear to be greatly improved over the past month likely consistent with Charcot  neuroarthropathy.  No significant change since last x-ray.  Assessment: 1. Status post partial first ray amputation left foot 2. Below-knee amputation right lower extremity 3. Charcot arthropathy left forefoot   Plan of Care:  1. Patient was evaluated. Today x-rays were reviewed 2. No significant changes noted to x-ray compared to previous x-ray. 3. Continue wearing DM shoes. 4. Return to clinic in 6 months for follow-up x-ray.   Edrick Kins, DPM [facility]  Dr. Edrick Kins, DPM    [address], [place] [address]                Office [phone]  Fax [phone]

## 2017-06-30 DIAGNOSIS — N2581 Secondary hyperparathyroidism of renal origin: Secondary | ICD-10-CM | POA: Diagnosis not present

## 2017-06-30 DIAGNOSIS — D689 Coagulation defect, unspecified: Secondary | ICD-10-CM | POA: Diagnosis not present

## 2017-06-30 DIAGNOSIS — Z992 Dependence on renal dialysis: Secondary | ICD-10-CM | POA: Diagnosis not present

## 2017-06-30 DIAGNOSIS — D631 Anemia in chronic kidney disease: Secondary | ICD-10-CM | POA: Diagnosis not present

## 2017-06-30 DIAGNOSIS — N186 End stage renal disease: Secondary | ICD-10-CM | POA: Diagnosis not present

## 2017-06-30 DIAGNOSIS — R52 Pain, unspecified: Secondary | ICD-10-CM | POA: Diagnosis not present

## 2017-06-30 DIAGNOSIS — E1122 Type 2 diabetes mellitus with diabetic chronic kidney disease: Secondary | ICD-10-CM | POA: Diagnosis not present

## 2017-06-30 DIAGNOSIS — D509 Iron deficiency anemia, unspecified: Secondary | ICD-10-CM | POA: Diagnosis not present

## 2017-07-02 DIAGNOSIS — D631 Anemia in chronic kidney disease: Secondary | ICD-10-CM | POA: Diagnosis not present

## 2017-07-02 DIAGNOSIS — D509 Iron deficiency anemia, unspecified: Secondary | ICD-10-CM | POA: Diagnosis not present

## 2017-07-02 DIAGNOSIS — E1122 Type 2 diabetes mellitus with diabetic chronic kidney disease: Secondary | ICD-10-CM | POA: Diagnosis not present

## 2017-07-02 DIAGNOSIS — D689 Coagulation defect, unspecified: Secondary | ICD-10-CM | POA: Diagnosis not present

## 2017-07-02 DIAGNOSIS — N186 End stage renal disease: Secondary | ICD-10-CM | POA: Diagnosis not present

## 2017-07-02 DIAGNOSIS — N2581 Secondary hyperparathyroidism of renal origin: Secondary | ICD-10-CM | POA: Diagnosis not present

## 2017-07-02 DIAGNOSIS — R52 Pain, unspecified: Secondary | ICD-10-CM | POA: Diagnosis not present

## 2017-07-04 DIAGNOSIS — E1122 Type 2 diabetes mellitus with diabetic chronic kidney disease: Secondary | ICD-10-CM | POA: Diagnosis not present

## 2017-07-04 DIAGNOSIS — D509 Iron deficiency anemia, unspecified: Secondary | ICD-10-CM | POA: Diagnosis not present

## 2017-07-04 DIAGNOSIS — D631 Anemia in chronic kidney disease: Secondary | ICD-10-CM | POA: Diagnosis not present

## 2017-07-04 DIAGNOSIS — N186 End stage renal disease: Secondary | ICD-10-CM | POA: Diagnosis not present

## 2017-07-04 DIAGNOSIS — R52 Pain, unspecified: Secondary | ICD-10-CM | POA: Diagnosis not present

## 2017-07-04 DIAGNOSIS — D689 Coagulation defect, unspecified: Secondary | ICD-10-CM | POA: Diagnosis not present

## 2017-07-04 DIAGNOSIS — N2581 Secondary hyperparathyroidism of renal origin: Secondary | ICD-10-CM | POA: Diagnosis not present

## 2017-07-07 DIAGNOSIS — R52 Pain, unspecified: Secondary | ICD-10-CM | POA: Diagnosis not present

## 2017-07-07 DIAGNOSIS — D689 Coagulation defect, unspecified: Secondary | ICD-10-CM | POA: Diagnosis not present

## 2017-07-07 DIAGNOSIS — D509 Iron deficiency anemia, unspecified: Secondary | ICD-10-CM | POA: Diagnosis not present

## 2017-07-07 DIAGNOSIS — E1122 Type 2 diabetes mellitus with diabetic chronic kidney disease: Secondary | ICD-10-CM | POA: Diagnosis not present

## 2017-07-07 DIAGNOSIS — N186 End stage renal disease: Secondary | ICD-10-CM | POA: Diagnosis not present

## 2017-07-07 DIAGNOSIS — D631 Anemia in chronic kidney disease: Secondary | ICD-10-CM | POA: Diagnosis not present

## 2017-07-07 DIAGNOSIS — N2581 Secondary hyperparathyroidism of renal origin: Secondary | ICD-10-CM | POA: Diagnosis not present

## 2017-07-09 DIAGNOSIS — D631 Anemia in chronic kidney disease: Secondary | ICD-10-CM | POA: Diagnosis not present

## 2017-07-09 DIAGNOSIS — D509 Iron deficiency anemia, unspecified: Secondary | ICD-10-CM | POA: Diagnosis not present

## 2017-07-09 DIAGNOSIS — R52 Pain, unspecified: Secondary | ICD-10-CM | POA: Diagnosis not present

## 2017-07-09 DIAGNOSIS — N2581 Secondary hyperparathyroidism of renal origin: Secondary | ICD-10-CM | POA: Diagnosis not present

## 2017-07-09 DIAGNOSIS — E1122 Type 2 diabetes mellitus with diabetic chronic kidney disease: Secondary | ICD-10-CM | POA: Diagnosis not present

## 2017-07-09 DIAGNOSIS — N186 End stage renal disease: Secondary | ICD-10-CM | POA: Diagnosis not present

## 2017-07-09 DIAGNOSIS — D689 Coagulation defect, unspecified: Secondary | ICD-10-CM | POA: Diagnosis not present

## 2017-07-11 DIAGNOSIS — E1122 Type 2 diabetes mellitus with diabetic chronic kidney disease: Secondary | ICD-10-CM | POA: Diagnosis not present

## 2017-07-11 DIAGNOSIS — D631 Anemia in chronic kidney disease: Secondary | ICD-10-CM | POA: Diagnosis not present

## 2017-07-11 DIAGNOSIS — N2581 Secondary hyperparathyroidism of renal origin: Secondary | ICD-10-CM | POA: Diagnosis not present

## 2017-07-11 DIAGNOSIS — D689 Coagulation defect, unspecified: Secondary | ICD-10-CM | POA: Diagnosis not present

## 2017-07-11 DIAGNOSIS — D509 Iron deficiency anemia, unspecified: Secondary | ICD-10-CM | POA: Diagnosis not present

## 2017-07-11 DIAGNOSIS — N186 End stage renal disease: Secondary | ICD-10-CM | POA: Diagnosis not present

## 2017-07-11 DIAGNOSIS — R52 Pain, unspecified: Secondary | ICD-10-CM | POA: Diagnosis not present

## 2017-07-14 DIAGNOSIS — E1122 Type 2 diabetes mellitus with diabetic chronic kidney disease: Secondary | ICD-10-CM | POA: Diagnosis not present

## 2017-07-14 DIAGNOSIS — N186 End stage renal disease: Secondary | ICD-10-CM | POA: Diagnosis not present

## 2017-07-14 DIAGNOSIS — D509 Iron deficiency anemia, unspecified: Secondary | ICD-10-CM | POA: Diagnosis not present

## 2017-07-14 DIAGNOSIS — D689 Coagulation defect, unspecified: Secondary | ICD-10-CM | POA: Diagnosis not present

## 2017-07-14 DIAGNOSIS — R52 Pain, unspecified: Secondary | ICD-10-CM | POA: Diagnosis not present

## 2017-07-14 DIAGNOSIS — D631 Anemia in chronic kidney disease: Secondary | ICD-10-CM | POA: Diagnosis not present

## 2017-07-14 DIAGNOSIS — N2581 Secondary hyperparathyroidism of renal origin: Secondary | ICD-10-CM | POA: Diagnosis not present

## 2017-07-16 DIAGNOSIS — D509 Iron deficiency anemia, unspecified: Secondary | ICD-10-CM | POA: Diagnosis not present

## 2017-07-16 DIAGNOSIS — R52 Pain, unspecified: Secondary | ICD-10-CM | POA: Diagnosis not present

## 2017-07-16 DIAGNOSIS — D631 Anemia in chronic kidney disease: Secondary | ICD-10-CM | POA: Diagnosis not present

## 2017-07-16 DIAGNOSIS — D689 Coagulation defect, unspecified: Secondary | ICD-10-CM | POA: Diagnosis not present

## 2017-07-16 DIAGNOSIS — N2581 Secondary hyperparathyroidism of renal origin: Secondary | ICD-10-CM | POA: Diagnosis not present

## 2017-07-16 DIAGNOSIS — E1122 Type 2 diabetes mellitus with diabetic chronic kidney disease: Secondary | ICD-10-CM | POA: Diagnosis not present

## 2017-07-16 DIAGNOSIS — N186 End stage renal disease: Secondary | ICD-10-CM | POA: Diagnosis not present

## 2017-07-18 DIAGNOSIS — D631 Anemia in chronic kidney disease: Secondary | ICD-10-CM | POA: Diagnosis not present

## 2017-07-18 DIAGNOSIS — D509 Iron deficiency anemia, unspecified: Secondary | ICD-10-CM | POA: Diagnosis not present

## 2017-07-18 DIAGNOSIS — N2581 Secondary hyperparathyroidism of renal origin: Secondary | ICD-10-CM | POA: Diagnosis not present

## 2017-07-18 DIAGNOSIS — E1122 Type 2 diabetes mellitus with diabetic chronic kidney disease: Secondary | ICD-10-CM | POA: Diagnosis not present

## 2017-07-18 DIAGNOSIS — R52 Pain, unspecified: Secondary | ICD-10-CM | POA: Diagnosis not present

## 2017-07-18 DIAGNOSIS — D689 Coagulation defect, unspecified: Secondary | ICD-10-CM | POA: Diagnosis not present

## 2017-07-18 DIAGNOSIS — N186 End stage renal disease: Secondary | ICD-10-CM | POA: Diagnosis not present

## 2017-07-21 DIAGNOSIS — D631 Anemia in chronic kidney disease: Secondary | ICD-10-CM | POA: Diagnosis not present

## 2017-07-21 DIAGNOSIS — E1122 Type 2 diabetes mellitus with diabetic chronic kidney disease: Secondary | ICD-10-CM | POA: Diagnosis not present

## 2017-07-21 DIAGNOSIS — D509 Iron deficiency anemia, unspecified: Secondary | ICD-10-CM | POA: Diagnosis not present

## 2017-07-21 DIAGNOSIS — R52 Pain, unspecified: Secondary | ICD-10-CM | POA: Diagnosis not present

## 2017-07-21 DIAGNOSIS — N2581 Secondary hyperparathyroidism of renal origin: Secondary | ICD-10-CM | POA: Diagnosis not present

## 2017-07-21 DIAGNOSIS — D689 Coagulation defect, unspecified: Secondary | ICD-10-CM | POA: Diagnosis not present

## 2017-07-21 DIAGNOSIS — N186 End stage renal disease: Secondary | ICD-10-CM | POA: Diagnosis not present

## 2017-07-25 DIAGNOSIS — D631 Anemia in chronic kidney disease: Secondary | ICD-10-CM | POA: Diagnosis not present

## 2017-07-25 DIAGNOSIS — E1122 Type 2 diabetes mellitus with diabetic chronic kidney disease: Secondary | ICD-10-CM | POA: Diagnosis not present

## 2017-07-25 DIAGNOSIS — D509 Iron deficiency anemia, unspecified: Secondary | ICD-10-CM | POA: Diagnosis not present

## 2017-07-25 DIAGNOSIS — N2581 Secondary hyperparathyroidism of renal origin: Secondary | ICD-10-CM | POA: Diagnosis not present

## 2017-07-25 DIAGNOSIS — N186 End stage renal disease: Secondary | ICD-10-CM | POA: Diagnosis not present

## 2017-07-25 DIAGNOSIS — D689 Coagulation defect, unspecified: Secondary | ICD-10-CM | POA: Diagnosis not present

## 2017-07-25 DIAGNOSIS — R52 Pain, unspecified: Secondary | ICD-10-CM | POA: Diagnosis not present

## 2017-07-28 DIAGNOSIS — N2581 Secondary hyperparathyroidism of renal origin: Secondary | ICD-10-CM | POA: Diagnosis not present

## 2017-07-28 DIAGNOSIS — R52 Pain, unspecified: Secondary | ICD-10-CM | POA: Diagnosis not present

## 2017-07-28 DIAGNOSIS — N186 End stage renal disease: Secondary | ICD-10-CM | POA: Diagnosis not present

## 2017-07-28 DIAGNOSIS — D631 Anemia in chronic kidney disease: Secondary | ICD-10-CM | POA: Diagnosis not present

## 2017-07-28 DIAGNOSIS — D689 Coagulation defect, unspecified: Secondary | ICD-10-CM | POA: Diagnosis not present

## 2017-07-28 DIAGNOSIS — E1122 Type 2 diabetes mellitus with diabetic chronic kidney disease: Secondary | ICD-10-CM | POA: Diagnosis not present

## 2017-07-28 DIAGNOSIS — D509 Iron deficiency anemia, unspecified: Secondary | ICD-10-CM | POA: Diagnosis not present

## 2017-07-30 DIAGNOSIS — E1122 Type 2 diabetes mellitus with diabetic chronic kidney disease: Secondary | ICD-10-CM | POA: Diagnosis not present

## 2017-07-30 DIAGNOSIS — D689 Coagulation defect, unspecified: Secondary | ICD-10-CM | POA: Diagnosis not present

## 2017-07-30 DIAGNOSIS — N186 End stage renal disease: Secondary | ICD-10-CM | POA: Diagnosis not present

## 2017-07-30 DIAGNOSIS — D509 Iron deficiency anemia, unspecified: Secondary | ICD-10-CM | POA: Diagnosis not present

## 2017-07-30 DIAGNOSIS — N2581 Secondary hyperparathyroidism of renal origin: Secondary | ICD-10-CM | POA: Diagnosis not present

## 2017-07-30 DIAGNOSIS — D631 Anemia in chronic kidney disease: Secondary | ICD-10-CM | POA: Diagnosis not present

## 2017-08-01 DIAGNOSIS — D631 Anemia in chronic kidney disease: Secondary | ICD-10-CM | POA: Diagnosis not present

## 2017-08-01 DIAGNOSIS — N2581 Secondary hyperparathyroidism of renal origin: Secondary | ICD-10-CM | POA: Diagnosis not present

## 2017-08-01 DIAGNOSIS — D689 Coagulation defect, unspecified: Secondary | ICD-10-CM | POA: Diagnosis not present

## 2017-08-01 DIAGNOSIS — N186 End stage renal disease: Secondary | ICD-10-CM | POA: Diagnosis not present

## 2017-08-01 DIAGNOSIS — D509 Iron deficiency anemia, unspecified: Secondary | ICD-10-CM | POA: Diagnosis not present

## 2017-08-01 DIAGNOSIS — E1122 Type 2 diabetes mellitus with diabetic chronic kidney disease: Secondary | ICD-10-CM | POA: Diagnosis not present

## 2017-08-04 DIAGNOSIS — N186 End stage renal disease: Secondary | ICD-10-CM | POA: Diagnosis not present

## 2017-08-04 DIAGNOSIS — E1122 Type 2 diabetes mellitus with diabetic chronic kidney disease: Secondary | ICD-10-CM | POA: Diagnosis not present

## 2017-08-04 DIAGNOSIS — D509 Iron deficiency anemia, unspecified: Secondary | ICD-10-CM | POA: Diagnosis not present

## 2017-08-04 DIAGNOSIS — N2581 Secondary hyperparathyroidism of renal origin: Secondary | ICD-10-CM | POA: Diagnosis not present

## 2017-08-04 DIAGNOSIS — D631 Anemia in chronic kidney disease: Secondary | ICD-10-CM | POA: Diagnosis not present

## 2017-08-04 DIAGNOSIS — D689 Coagulation defect, unspecified: Secondary | ICD-10-CM | POA: Diagnosis not present

## 2017-08-06 DIAGNOSIS — D631 Anemia in chronic kidney disease: Secondary | ICD-10-CM | POA: Diagnosis not present

## 2017-08-06 DIAGNOSIS — D689 Coagulation defect, unspecified: Secondary | ICD-10-CM | POA: Diagnosis not present

## 2017-08-06 DIAGNOSIS — N186 End stage renal disease: Secondary | ICD-10-CM | POA: Diagnosis not present

## 2017-08-06 DIAGNOSIS — D509 Iron deficiency anemia, unspecified: Secondary | ICD-10-CM | POA: Diagnosis not present

## 2017-08-06 DIAGNOSIS — N2581 Secondary hyperparathyroidism of renal origin: Secondary | ICD-10-CM | POA: Diagnosis not present

## 2017-08-06 DIAGNOSIS — E1122 Type 2 diabetes mellitus with diabetic chronic kidney disease: Secondary | ICD-10-CM | POA: Diagnosis not present

## 2017-08-08 DIAGNOSIS — D689 Coagulation defect, unspecified: Secondary | ICD-10-CM | POA: Diagnosis not present

## 2017-08-08 DIAGNOSIS — E1122 Type 2 diabetes mellitus with diabetic chronic kidney disease: Secondary | ICD-10-CM | POA: Diagnosis not present

## 2017-08-08 DIAGNOSIS — D631 Anemia in chronic kidney disease: Secondary | ICD-10-CM | POA: Diagnosis not present

## 2017-08-08 DIAGNOSIS — N186 End stage renal disease: Secondary | ICD-10-CM | POA: Diagnosis not present

## 2017-08-08 DIAGNOSIS — N2581 Secondary hyperparathyroidism of renal origin: Secondary | ICD-10-CM | POA: Diagnosis not present

## 2017-08-08 DIAGNOSIS — D509 Iron deficiency anemia, unspecified: Secondary | ICD-10-CM | POA: Diagnosis not present

## 2017-08-11 DIAGNOSIS — N186 End stage renal disease: Secondary | ICD-10-CM | POA: Diagnosis not present

## 2017-08-11 DIAGNOSIS — D631 Anemia in chronic kidney disease: Secondary | ICD-10-CM | POA: Diagnosis not present

## 2017-08-11 DIAGNOSIS — D689 Coagulation defect, unspecified: Secondary | ICD-10-CM | POA: Diagnosis not present

## 2017-08-11 DIAGNOSIS — D509 Iron deficiency anemia, unspecified: Secondary | ICD-10-CM | POA: Diagnosis not present

## 2017-08-11 DIAGNOSIS — E1122 Type 2 diabetes mellitus with diabetic chronic kidney disease: Secondary | ICD-10-CM | POA: Diagnosis not present

## 2017-08-11 DIAGNOSIS — N2581 Secondary hyperparathyroidism of renal origin: Secondary | ICD-10-CM | POA: Diagnosis not present

## 2017-08-13 DIAGNOSIS — E1122 Type 2 diabetes mellitus with diabetic chronic kidney disease: Secondary | ICD-10-CM | POA: Diagnosis not present

## 2017-08-13 DIAGNOSIS — D689 Coagulation defect, unspecified: Secondary | ICD-10-CM | POA: Diagnosis not present

## 2017-08-13 DIAGNOSIS — D631 Anemia in chronic kidney disease: Secondary | ICD-10-CM | POA: Diagnosis not present

## 2017-08-13 DIAGNOSIS — D509 Iron deficiency anemia, unspecified: Secondary | ICD-10-CM | POA: Diagnosis not present

## 2017-08-13 DIAGNOSIS — N2581 Secondary hyperparathyroidism of renal origin: Secondary | ICD-10-CM | POA: Diagnosis not present

## 2017-08-13 DIAGNOSIS — N186 End stage renal disease: Secondary | ICD-10-CM | POA: Diagnosis not present

## 2017-08-15 DIAGNOSIS — D631 Anemia in chronic kidney disease: Secondary | ICD-10-CM | POA: Diagnosis not present

## 2017-08-15 DIAGNOSIS — N186 End stage renal disease: Secondary | ICD-10-CM | POA: Diagnosis not present

## 2017-08-15 DIAGNOSIS — N2581 Secondary hyperparathyroidism of renal origin: Secondary | ICD-10-CM | POA: Diagnosis not present

## 2017-08-15 DIAGNOSIS — D509 Iron deficiency anemia, unspecified: Secondary | ICD-10-CM | POA: Diagnosis not present

## 2017-08-15 DIAGNOSIS — D689 Coagulation defect, unspecified: Secondary | ICD-10-CM | POA: Diagnosis not present

## 2017-08-15 DIAGNOSIS — E1122 Type 2 diabetes mellitus with diabetic chronic kidney disease: Secondary | ICD-10-CM | POA: Diagnosis not present

## 2017-08-18 DIAGNOSIS — N2581 Secondary hyperparathyroidism of renal origin: Secondary | ICD-10-CM | POA: Diagnosis not present

## 2017-08-18 DIAGNOSIS — D689 Coagulation defect, unspecified: Secondary | ICD-10-CM | POA: Diagnosis not present

## 2017-08-18 DIAGNOSIS — D509 Iron deficiency anemia, unspecified: Secondary | ICD-10-CM | POA: Diagnosis not present

## 2017-08-18 DIAGNOSIS — D631 Anemia in chronic kidney disease: Secondary | ICD-10-CM | POA: Diagnosis not present

## 2017-08-18 DIAGNOSIS — E1122 Type 2 diabetes mellitus with diabetic chronic kidney disease: Secondary | ICD-10-CM | POA: Diagnosis not present

## 2017-08-18 DIAGNOSIS — N186 End stage renal disease: Secondary | ICD-10-CM | POA: Diagnosis not present

## 2017-08-19 DIAGNOSIS — E782 Mixed hyperlipidemia: Secondary | ICD-10-CM | POA: Diagnosis not present

## 2017-08-19 DIAGNOSIS — E1143 Type 2 diabetes mellitus with diabetic autonomic (poly)neuropathy: Secondary | ICD-10-CM | POA: Diagnosis not present

## 2017-08-19 DIAGNOSIS — E1122 Type 2 diabetes mellitus with diabetic chronic kidney disease: Secondary | ICD-10-CM | POA: Diagnosis not present

## 2017-08-19 DIAGNOSIS — Z1389 Encounter for screening for other disorder: Secondary | ICD-10-CM | POA: Diagnosis not present

## 2017-08-19 DIAGNOSIS — Z0000 Encounter for general adult medical examination without abnormal findings: Secondary | ICD-10-CM | POA: Diagnosis not present

## 2017-08-20 DIAGNOSIS — N2581 Secondary hyperparathyroidism of renal origin: Secondary | ICD-10-CM | POA: Diagnosis not present

## 2017-08-20 DIAGNOSIS — D631 Anemia in chronic kidney disease: Secondary | ICD-10-CM | POA: Diagnosis not present

## 2017-08-20 DIAGNOSIS — N186 End stage renal disease: Secondary | ICD-10-CM | POA: Diagnosis not present

## 2017-08-20 DIAGNOSIS — D689 Coagulation defect, unspecified: Secondary | ICD-10-CM | POA: Diagnosis not present

## 2017-08-20 DIAGNOSIS — E1122 Type 2 diabetes mellitus with diabetic chronic kidney disease: Secondary | ICD-10-CM | POA: Diagnosis not present

## 2017-08-20 DIAGNOSIS — D509 Iron deficiency anemia, unspecified: Secondary | ICD-10-CM | POA: Diagnosis not present

## 2017-08-22 DIAGNOSIS — D689 Coagulation defect, unspecified: Secondary | ICD-10-CM | POA: Diagnosis not present

## 2017-08-22 DIAGNOSIS — N186 End stage renal disease: Secondary | ICD-10-CM | POA: Diagnosis not present

## 2017-08-22 DIAGNOSIS — N2581 Secondary hyperparathyroidism of renal origin: Secondary | ICD-10-CM | POA: Diagnosis not present

## 2017-08-22 DIAGNOSIS — E1122 Type 2 diabetes mellitus with diabetic chronic kidney disease: Secondary | ICD-10-CM | POA: Diagnosis not present

## 2017-08-22 DIAGNOSIS — D509 Iron deficiency anemia, unspecified: Secondary | ICD-10-CM | POA: Diagnosis not present

## 2017-08-22 DIAGNOSIS — D631 Anemia in chronic kidney disease: Secondary | ICD-10-CM | POA: Diagnosis not present

## 2017-08-25 DIAGNOSIS — N186 End stage renal disease: Secondary | ICD-10-CM | POA: Diagnosis not present

## 2017-08-25 DIAGNOSIS — E1122 Type 2 diabetes mellitus with diabetic chronic kidney disease: Secondary | ICD-10-CM | POA: Diagnosis not present

## 2017-08-25 DIAGNOSIS — N2581 Secondary hyperparathyroidism of renal origin: Secondary | ICD-10-CM | POA: Diagnosis not present

## 2017-08-25 DIAGNOSIS — D509 Iron deficiency anemia, unspecified: Secondary | ICD-10-CM | POA: Diagnosis not present

## 2017-08-25 DIAGNOSIS — D631 Anemia in chronic kidney disease: Secondary | ICD-10-CM | POA: Diagnosis not present

## 2017-08-25 DIAGNOSIS — D689 Coagulation defect, unspecified: Secondary | ICD-10-CM | POA: Diagnosis not present

## 2017-08-27 DIAGNOSIS — D631 Anemia in chronic kidney disease: Secondary | ICD-10-CM | POA: Diagnosis not present

## 2017-08-27 DIAGNOSIS — D509 Iron deficiency anemia, unspecified: Secondary | ICD-10-CM | POA: Diagnosis not present

## 2017-08-27 DIAGNOSIS — N186 End stage renal disease: Secondary | ICD-10-CM | POA: Diagnosis not present

## 2017-08-27 DIAGNOSIS — D689 Coagulation defect, unspecified: Secondary | ICD-10-CM | POA: Diagnosis not present

## 2017-08-27 DIAGNOSIS — N2581 Secondary hyperparathyroidism of renal origin: Secondary | ICD-10-CM | POA: Diagnosis not present

## 2017-08-27 DIAGNOSIS — E1122 Type 2 diabetes mellitus with diabetic chronic kidney disease: Secondary | ICD-10-CM | POA: Diagnosis not present

## 2017-08-29 DIAGNOSIS — D689 Coagulation defect, unspecified: Secondary | ICD-10-CM | POA: Diagnosis not present

## 2017-08-29 DIAGNOSIS — D509 Iron deficiency anemia, unspecified: Secondary | ICD-10-CM | POA: Diagnosis not present

## 2017-08-29 DIAGNOSIS — D631 Anemia in chronic kidney disease: Secondary | ICD-10-CM | POA: Diagnosis not present

## 2017-08-29 DIAGNOSIS — Z992 Dependence on renal dialysis: Secondary | ICD-10-CM | POA: Diagnosis not present

## 2017-08-29 DIAGNOSIS — N186 End stage renal disease: Secondary | ICD-10-CM | POA: Diagnosis not present

## 2017-08-29 DIAGNOSIS — E1122 Type 2 diabetes mellitus with diabetic chronic kidney disease: Secondary | ICD-10-CM | POA: Diagnosis not present

## 2017-08-29 DIAGNOSIS — N2581 Secondary hyperparathyroidism of renal origin: Secondary | ICD-10-CM | POA: Diagnosis not present

## 2017-09-01 DIAGNOSIS — E1122 Type 2 diabetes mellitus with diabetic chronic kidney disease: Secondary | ICD-10-CM | POA: Diagnosis not present

## 2017-09-01 DIAGNOSIS — N2581 Secondary hyperparathyroidism of renal origin: Secondary | ICD-10-CM | POA: Diagnosis not present

## 2017-09-01 DIAGNOSIS — D631 Anemia in chronic kidney disease: Secondary | ICD-10-CM | POA: Diagnosis not present

## 2017-09-01 DIAGNOSIS — D689 Coagulation defect, unspecified: Secondary | ICD-10-CM | POA: Diagnosis not present

## 2017-09-01 DIAGNOSIS — D509 Iron deficiency anemia, unspecified: Secondary | ICD-10-CM | POA: Diagnosis not present

## 2017-09-01 DIAGNOSIS — N186 End stage renal disease: Secondary | ICD-10-CM | POA: Diagnosis not present

## 2017-09-03 DIAGNOSIS — E1122 Type 2 diabetes mellitus with diabetic chronic kidney disease: Secondary | ICD-10-CM | POA: Diagnosis not present

## 2017-09-03 DIAGNOSIS — N186 End stage renal disease: Secondary | ICD-10-CM | POA: Diagnosis not present

## 2017-09-03 DIAGNOSIS — N2581 Secondary hyperparathyroidism of renal origin: Secondary | ICD-10-CM | POA: Diagnosis not present

## 2017-09-03 DIAGNOSIS — D631 Anemia in chronic kidney disease: Secondary | ICD-10-CM | POA: Diagnosis not present

## 2017-09-03 DIAGNOSIS — D689 Coagulation defect, unspecified: Secondary | ICD-10-CM | POA: Diagnosis not present

## 2017-09-03 DIAGNOSIS — D509 Iron deficiency anemia, unspecified: Secondary | ICD-10-CM | POA: Diagnosis not present

## 2017-09-05 DIAGNOSIS — N186 End stage renal disease: Secondary | ICD-10-CM | POA: Diagnosis not present

## 2017-09-05 DIAGNOSIS — D631 Anemia in chronic kidney disease: Secondary | ICD-10-CM | POA: Diagnosis not present

## 2017-09-05 DIAGNOSIS — N2581 Secondary hyperparathyroidism of renal origin: Secondary | ICD-10-CM | POA: Diagnosis not present

## 2017-09-05 DIAGNOSIS — E1122 Type 2 diabetes mellitus with diabetic chronic kidney disease: Secondary | ICD-10-CM | POA: Diagnosis not present

## 2017-09-05 DIAGNOSIS — D509 Iron deficiency anemia, unspecified: Secondary | ICD-10-CM | POA: Diagnosis not present

## 2017-09-05 DIAGNOSIS — D689 Coagulation defect, unspecified: Secondary | ICD-10-CM | POA: Diagnosis not present

## 2017-09-08 DIAGNOSIS — D631 Anemia in chronic kidney disease: Secondary | ICD-10-CM | POA: Diagnosis not present

## 2017-09-08 DIAGNOSIS — D689 Coagulation defect, unspecified: Secondary | ICD-10-CM | POA: Diagnosis not present

## 2017-09-08 DIAGNOSIS — N186 End stage renal disease: Secondary | ICD-10-CM | POA: Diagnosis not present

## 2017-09-08 DIAGNOSIS — E1122 Type 2 diabetes mellitus with diabetic chronic kidney disease: Secondary | ICD-10-CM | POA: Diagnosis not present

## 2017-09-08 DIAGNOSIS — N2581 Secondary hyperparathyroidism of renal origin: Secondary | ICD-10-CM | POA: Diagnosis not present

## 2017-09-08 DIAGNOSIS — D509 Iron deficiency anemia, unspecified: Secondary | ICD-10-CM | POA: Diagnosis not present

## 2017-09-10 DIAGNOSIS — E1122 Type 2 diabetes mellitus with diabetic chronic kidney disease: Secondary | ICD-10-CM | POA: Diagnosis not present

## 2017-09-10 DIAGNOSIS — N2581 Secondary hyperparathyroidism of renal origin: Secondary | ICD-10-CM | POA: Diagnosis not present

## 2017-09-10 DIAGNOSIS — D509 Iron deficiency anemia, unspecified: Secondary | ICD-10-CM | POA: Diagnosis not present

## 2017-09-10 DIAGNOSIS — N186 End stage renal disease: Secondary | ICD-10-CM | POA: Diagnosis not present

## 2017-09-10 DIAGNOSIS — D631 Anemia in chronic kidney disease: Secondary | ICD-10-CM | POA: Diagnosis not present

## 2017-09-10 DIAGNOSIS — D689 Coagulation defect, unspecified: Secondary | ICD-10-CM | POA: Diagnosis not present

## 2017-09-12 DIAGNOSIS — E1122 Type 2 diabetes mellitus with diabetic chronic kidney disease: Secondary | ICD-10-CM | POA: Diagnosis not present

## 2017-09-12 DIAGNOSIS — D631 Anemia in chronic kidney disease: Secondary | ICD-10-CM | POA: Diagnosis not present

## 2017-09-12 DIAGNOSIS — N2581 Secondary hyperparathyroidism of renal origin: Secondary | ICD-10-CM | POA: Diagnosis not present

## 2017-09-12 DIAGNOSIS — D509 Iron deficiency anemia, unspecified: Secondary | ICD-10-CM | POA: Diagnosis not present

## 2017-09-12 DIAGNOSIS — D689 Coagulation defect, unspecified: Secondary | ICD-10-CM | POA: Diagnosis not present

## 2017-09-12 DIAGNOSIS — N186 End stage renal disease: Secondary | ICD-10-CM | POA: Diagnosis not present

## 2017-09-15 DIAGNOSIS — N186 End stage renal disease: Secondary | ICD-10-CM | POA: Diagnosis not present

## 2017-09-15 DIAGNOSIS — D689 Coagulation defect, unspecified: Secondary | ICD-10-CM | POA: Diagnosis not present

## 2017-09-15 DIAGNOSIS — N2581 Secondary hyperparathyroidism of renal origin: Secondary | ICD-10-CM | POA: Diagnosis not present

## 2017-09-15 DIAGNOSIS — D509 Iron deficiency anemia, unspecified: Secondary | ICD-10-CM | POA: Diagnosis not present

## 2017-09-15 DIAGNOSIS — E1122 Type 2 diabetes mellitus with diabetic chronic kidney disease: Secondary | ICD-10-CM | POA: Diagnosis not present

## 2017-09-15 DIAGNOSIS — D631 Anemia in chronic kidney disease: Secondary | ICD-10-CM | POA: Diagnosis not present

## 2017-09-18 DIAGNOSIS — D509 Iron deficiency anemia, unspecified: Secondary | ICD-10-CM | POA: Diagnosis not present

## 2017-09-18 DIAGNOSIS — N186 End stage renal disease: Secondary | ICD-10-CM | POA: Diagnosis not present

## 2017-09-18 DIAGNOSIS — E1122 Type 2 diabetes mellitus with diabetic chronic kidney disease: Secondary | ICD-10-CM | POA: Diagnosis not present

## 2017-09-18 DIAGNOSIS — D689 Coagulation defect, unspecified: Secondary | ICD-10-CM | POA: Diagnosis not present

## 2017-09-18 DIAGNOSIS — D631 Anemia in chronic kidney disease: Secondary | ICD-10-CM | POA: Diagnosis not present

## 2017-09-18 DIAGNOSIS — N2581 Secondary hyperparathyroidism of renal origin: Secondary | ICD-10-CM | POA: Diagnosis not present

## 2017-09-19 DIAGNOSIS — N2581 Secondary hyperparathyroidism of renal origin: Secondary | ICD-10-CM | POA: Diagnosis not present

## 2017-09-19 DIAGNOSIS — E1122 Type 2 diabetes mellitus with diabetic chronic kidney disease: Secondary | ICD-10-CM | POA: Diagnosis not present

## 2017-09-19 DIAGNOSIS — D509 Iron deficiency anemia, unspecified: Secondary | ICD-10-CM | POA: Diagnosis not present

## 2017-09-19 DIAGNOSIS — D689 Coagulation defect, unspecified: Secondary | ICD-10-CM | POA: Diagnosis not present

## 2017-09-19 DIAGNOSIS — D631 Anemia in chronic kidney disease: Secondary | ICD-10-CM | POA: Diagnosis not present

## 2017-09-19 DIAGNOSIS — N186 End stage renal disease: Secondary | ICD-10-CM | POA: Diagnosis not present

## 2017-09-22 DIAGNOSIS — D509 Iron deficiency anemia, unspecified: Secondary | ICD-10-CM | POA: Diagnosis not present

## 2017-09-22 DIAGNOSIS — D631 Anemia in chronic kidney disease: Secondary | ICD-10-CM | POA: Diagnosis not present

## 2017-09-22 DIAGNOSIS — D689 Coagulation defect, unspecified: Secondary | ICD-10-CM | POA: Diagnosis not present

## 2017-09-22 DIAGNOSIS — E1122 Type 2 diabetes mellitus with diabetic chronic kidney disease: Secondary | ICD-10-CM | POA: Diagnosis not present

## 2017-09-22 DIAGNOSIS — N186 End stage renal disease: Secondary | ICD-10-CM | POA: Diagnosis not present

## 2017-09-22 DIAGNOSIS — N2581 Secondary hyperparathyroidism of renal origin: Secondary | ICD-10-CM | POA: Diagnosis not present

## 2017-09-24 DIAGNOSIS — E1122 Type 2 diabetes mellitus with diabetic chronic kidney disease: Secondary | ICD-10-CM | POA: Diagnosis not present

## 2017-09-24 DIAGNOSIS — N2581 Secondary hyperparathyroidism of renal origin: Secondary | ICD-10-CM | POA: Diagnosis not present

## 2017-09-24 DIAGNOSIS — D509 Iron deficiency anemia, unspecified: Secondary | ICD-10-CM | POA: Diagnosis not present

## 2017-09-24 DIAGNOSIS — D631 Anemia in chronic kidney disease: Secondary | ICD-10-CM | POA: Diagnosis not present

## 2017-09-24 DIAGNOSIS — D689 Coagulation defect, unspecified: Secondary | ICD-10-CM | POA: Diagnosis not present

## 2017-09-24 DIAGNOSIS — N186 End stage renal disease: Secondary | ICD-10-CM | POA: Diagnosis not present

## 2017-09-26 DIAGNOSIS — E1122 Type 2 diabetes mellitus with diabetic chronic kidney disease: Secondary | ICD-10-CM | POA: Diagnosis not present

## 2017-09-26 DIAGNOSIS — D509 Iron deficiency anemia, unspecified: Secondary | ICD-10-CM | POA: Diagnosis not present

## 2017-09-26 DIAGNOSIS — D689 Coagulation defect, unspecified: Secondary | ICD-10-CM | POA: Diagnosis not present

## 2017-09-26 DIAGNOSIS — N2581 Secondary hyperparathyroidism of renal origin: Secondary | ICD-10-CM | POA: Diagnosis not present

## 2017-09-26 DIAGNOSIS — N186 End stage renal disease: Secondary | ICD-10-CM | POA: Diagnosis not present

## 2017-09-26 DIAGNOSIS — D631 Anemia in chronic kidney disease: Secondary | ICD-10-CM | POA: Diagnosis not present

## 2017-09-28 DIAGNOSIS — N186 End stage renal disease: Secondary | ICD-10-CM | POA: Diagnosis not present

## 2017-09-28 DIAGNOSIS — E1122 Type 2 diabetes mellitus with diabetic chronic kidney disease: Secondary | ICD-10-CM | POA: Diagnosis not present

## 2017-09-28 DIAGNOSIS — Z992 Dependence on renal dialysis: Secondary | ICD-10-CM | POA: Diagnosis not present

## 2017-09-29 DIAGNOSIS — E1122 Type 2 diabetes mellitus with diabetic chronic kidney disease: Secondary | ICD-10-CM | POA: Diagnosis not present

## 2017-09-29 DIAGNOSIS — N186 End stage renal disease: Secondary | ICD-10-CM | POA: Diagnosis not present

## 2017-09-29 DIAGNOSIS — D509 Iron deficiency anemia, unspecified: Secondary | ICD-10-CM | POA: Diagnosis not present

## 2017-09-29 DIAGNOSIS — N2581 Secondary hyperparathyroidism of renal origin: Secondary | ICD-10-CM | POA: Diagnosis not present

## 2017-09-29 DIAGNOSIS — E039 Hypothyroidism, unspecified: Secondary | ICD-10-CM | POA: Diagnosis not present

## 2017-09-29 DIAGNOSIS — R197 Diarrhea, unspecified: Secondary | ICD-10-CM | POA: Diagnosis not present

## 2017-09-29 DIAGNOSIS — D689 Coagulation defect, unspecified: Secondary | ICD-10-CM | POA: Diagnosis not present

## 2017-09-29 DIAGNOSIS — D631 Anemia in chronic kidney disease: Secondary | ICD-10-CM | POA: Diagnosis not present

## 2017-10-01 DIAGNOSIS — E039 Hypothyroidism, unspecified: Secondary | ICD-10-CM | POA: Diagnosis not present

## 2017-10-01 DIAGNOSIS — D631 Anemia in chronic kidney disease: Secondary | ICD-10-CM | POA: Diagnosis not present

## 2017-10-01 DIAGNOSIS — R197 Diarrhea, unspecified: Secondary | ICD-10-CM | POA: Diagnosis not present

## 2017-10-01 DIAGNOSIS — D689 Coagulation defect, unspecified: Secondary | ICD-10-CM | POA: Diagnosis not present

## 2017-10-01 DIAGNOSIS — E1122 Type 2 diabetes mellitus with diabetic chronic kidney disease: Secondary | ICD-10-CM | POA: Diagnosis not present

## 2017-10-01 DIAGNOSIS — N186 End stage renal disease: Secondary | ICD-10-CM | POA: Diagnosis not present

## 2017-10-01 DIAGNOSIS — N2581 Secondary hyperparathyroidism of renal origin: Secondary | ICD-10-CM | POA: Diagnosis not present

## 2017-10-01 DIAGNOSIS — D509 Iron deficiency anemia, unspecified: Secondary | ICD-10-CM | POA: Diagnosis not present

## 2017-10-03 DIAGNOSIS — N186 End stage renal disease: Secondary | ICD-10-CM | POA: Diagnosis not present

## 2017-10-03 DIAGNOSIS — D631 Anemia in chronic kidney disease: Secondary | ICD-10-CM | POA: Diagnosis not present

## 2017-10-03 DIAGNOSIS — D689 Coagulation defect, unspecified: Secondary | ICD-10-CM | POA: Diagnosis not present

## 2017-10-03 DIAGNOSIS — D509 Iron deficiency anemia, unspecified: Secondary | ICD-10-CM | POA: Diagnosis not present

## 2017-10-03 DIAGNOSIS — E1122 Type 2 diabetes mellitus with diabetic chronic kidney disease: Secondary | ICD-10-CM | POA: Diagnosis not present

## 2017-10-03 DIAGNOSIS — N2581 Secondary hyperparathyroidism of renal origin: Secondary | ICD-10-CM | POA: Diagnosis not present

## 2017-10-03 DIAGNOSIS — E039 Hypothyroidism, unspecified: Secondary | ICD-10-CM | POA: Diagnosis not present

## 2017-10-03 DIAGNOSIS — R197 Diarrhea, unspecified: Secondary | ICD-10-CM | POA: Diagnosis not present

## 2017-10-06 DIAGNOSIS — N186 End stage renal disease: Secondary | ICD-10-CM | POA: Diagnosis not present

## 2017-10-06 DIAGNOSIS — E1122 Type 2 diabetes mellitus with diabetic chronic kidney disease: Secondary | ICD-10-CM | POA: Diagnosis not present

## 2017-10-06 DIAGNOSIS — D689 Coagulation defect, unspecified: Secondary | ICD-10-CM | POA: Diagnosis not present

## 2017-10-06 DIAGNOSIS — E039 Hypothyroidism, unspecified: Secondary | ICD-10-CM | POA: Diagnosis not present

## 2017-10-06 DIAGNOSIS — R197 Diarrhea, unspecified: Secondary | ICD-10-CM | POA: Diagnosis not present

## 2017-10-06 DIAGNOSIS — N2581 Secondary hyperparathyroidism of renal origin: Secondary | ICD-10-CM | POA: Diagnosis not present

## 2017-10-06 DIAGNOSIS — D509 Iron deficiency anemia, unspecified: Secondary | ICD-10-CM | POA: Diagnosis not present

## 2017-10-06 DIAGNOSIS — D631 Anemia in chronic kidney disease: Secondary | ICD-10-CM | POA: Diagnosis not present

## 2017-10-08 DIAGNOSIS — N2581 Secondary hyperparathyroidism of renal origin: Secondary | ICD-10-CM | POA: Diagnosis not present

## 2017-10-08 DIAGNOSIS — R197 Diarrhea, unspecified: Secondary | ICD-10-CM | POA: Diagnosis not present

## 2017-10-08 DIAGNOSIS — E1122 Type 2 diabetes mellitus with diabetic chronic kidney disease: Secondary | ICD-10-CM | POA: Diagnosis not present

## 2017-10-08 DIAGNOSIS — D689 Coagulation defect, unspecified: Secondary | ICD-10-CM | POA: Diagnosis not present

## 2017-10-08 DIAGNOSIS — N186 End stage renal disease: Secondary | ICD-10-CM | POA: Diagnosis not present

## 2017-10-08 DIAGNOSIS — D509 Iron deficiency anemia, unspecified: Secondary | ICD-10-CM | POA: Diagnosis not present

## 2017-10-08 DIAGNOSIS — E039 Hypothyroidism, unspecified: Secondary | ICD-10-CM | POA: Diagnosis not present

## 2017-10-08 DIAGNOSIS — D631 Anemia in chronic kidney disease: Secondary | ICD-10-CM | POA: Diagnosis not present

## 2017-10-10 DIAGNOSIS — N2581 Secondary hyperparathyroidism of renal origin: Secondary | ICD-10-CM | POA: Diagnosis not present

## 2017-10-10 DIAGNOSIS — D631 Anemia in chronic kidney disease: Secondary | ICD-10-CM | POA: Diagnosis not present

## 2017-10-10 DIAGNOSIS — E1122 Type 2 diabetes mellitus with diabetic chronic kidney disease: Secondary | ICD-10-CM | POA: Diagnosis not present

## 2017-10-10 DIAGNOSIS — R197 Diarrhea, unspecified: Secondary | ICD-10-CM | POA: Diagnosis not present

## 2017-10-10 DIAGNOSIS — E039 Hypothyroidism, unspecified: Secondary | ICD-10-CM | POA: Diagnosis not present

## 2017-10-10 DIAGNOSIS — D689 Coagulation defect, unspecified: Secondary | ICD-10-CM | POA: Diagnosis not present

## 2017-10-10 DIAGNOSIS — D509 Iron deficiency anemia, unspecified: Secondary | ICD-10-CM | POA: Diagnosis not present

## 2017-10-10 DIAGNOSIS — N186 End stage renal disease: Secondary | ICD-10-CM | POA: Diagnosis not present

## 2017-10-13 DIAGNOSIS — R197 Diarrhea, unspecified: Secondary | ICD-10-CM | POA: Diagnosis not present

## 2017-10-13 DIAGNOSIS — E1122 Type 2 diabetes mellitus with diabetic chronic kidney disease: Secondary | ICD-10-CM | POA: Diagnosis not present

## 2017-10-13 DIAGNOSIS — N186 End stage renal disease: Secondary | ICD-10-CM | POA: Diagnosis not present

## 2017-10-13 DIAGNOSIS — D509 Iron deficiency anemia, unspecified: Secondary | ICD-10-CM | POA: Diagnosis not present

## 2017-10-13 DIAGNOSIS — D689 Coagulation defect, unspecified: Secondary | ICD-10-CM | POA: Diagnosis not present

## 2017-10-13 DIAGNOSIS — E039 Hypothyroidism, unspecified: Secondary | ICD-10-CM | POA: Diagnosis not present

## 2017-10-13 DIAGNOSIS — N2581 Secondary hyperparathyroidism of renal origin: Secondary | ICD-10-CM | POA: Diagnosis not present

## 2017-10-13 DIAGNOSIS — D631 Anemia in chronic kidney disease: Secondary | ICD-10-CM | POA: Diagnosis not present

## 2017-10-14 ENCOUNTER — Other Ambulatory Visit: Payer: Self-pay | Admitting: Nephrology

## 2017-10-15 ENCOUNTER — Ambulatory Visit: Payer: Medicare Other | Admitting: Podiatry

## 2017-10-15 DIAGNOSIS — D631 Anemia in chronic kidney disease: Secondary | ICD-10-CM | POA: Diagnosis not present

## 2017-10-15 DIAGNOSIS — E1122 Type 2 diabetes mellitus with diabetic chronic kidney disease: Secondary | ICD-10-CM | POA: Diagnosis not present

## 2017-10-15 DIAGNOSIS — N186 End stage renal disease: Secondary | ICD-10-CM | POA: Diagnosis not present

## 2017-10-15 DIAGNOSIS — R197 Diarrhea, unspecified: Secondary | ICD-10-CM | POA: Diagnosis not present

## 2017-10-15 DIAGNOSIS — E039 Hypothyroidism, unspecified: Secondary | ICD-10-CM | POA: Diagnosis not present

## 2017-10-15 DIAGNOSIS — N2581 Secondary hyperparathyroidism of renal origin: Secondary | ICD-10-CM | POA: Diagnosis not present

## 2017-10-15 DIAGNOSIS — D689 Coagulation defect, unspecified: Secondary | ICD-10-CM | POA: Diagnosis not present

## 2017-10-15 DIAGNOSIS — D509 Iron deficiency anemia, unspecified: Secondary | ICD-10-CM | POA: Diagnosis not present

## 2017-10-16 DIAGNOSIS — R197 Diarrhea, unspecified: Secondary | ICD-10-CM | POA: Diagnosis not present

## 2017-10-16 DIAGNOSIS — D509 Iron deficiency anemia, unspecified: Secondary | ICD-10-CM | POA: Diagnosis not present

## 2017-10-16 DIAGNOSIS — N186 End stage renal disease: Secondary | ICD-10-CM | POA: Diagnosis not present

## 2017-10-16 DIAGNOSIS — N2581 Secondary hyperparathyroidism of renal origin: Secondary | ICD-10-CM | POA: Diagnosis not present

## 2017-10-16 DIAGNOSIS — E039 Hypothyroidism, unspecified: Secondary | ICD-10-CM | POA: Diagnosis not present

## 2017-10-16 DIAGNOSIS — D631 Anemia in chronic kidney disease: Secondary | ICD-10-CM | POA: Diagnosis not present

## 2017-10-16 DIAGNOSIS — E1122 Type 2 diabetes mellitus with diabetic chronic kidney disease: Secondary | ICD-10-CM | POA: Diagnosis not present

## 2017-10-16 DIAGNOSIS — D689 Coagulation defect, unspecified: Secondary | ICD-10-CM | POA: Diagnosis not present

## 2017-10-20 ENCOUNTER — Other Ambulatory Visit: Payer: Self-pay | Admitting: Nephrology

## 2017-10-20 DIAGNOSIS — D631 Anemia in chronic kidney disease: Secondary | ICD-10-CM | POA: Diagnosis not present

## 2017-10-20 DIAGNOSIS — N2581 Secondary hyperparathyroidism of renal origin: Secondary | ICD-10-CM | POA: Diagnosis not present

## 2017-10-20 DIAGNOSIS — E1122 Type 2 diabetes mellitus with diabetic chronic kidney disease: Secondary | ICD-10-CM | POA: Diagnosis not present

## 2017-10-20 DIAGNOSIS — D689 Coagulation defect, unspecified: Secondary | ICD-10-CM | POA: Diagnosis not present

## 2017-10-20 DIAGNOSIS — E039 Hypothyroidism, unspecified: Secondary | ICD-10-CM | POA: Diagnosis not present

## 2017-10-20 DIAGNOSIS — D509 Iron deficiency anemia, unspecified: Secondary | ICD-10-CM | POA: Diagnosis not present

## 2017-10-20 DIAGNOSIS — N186 End stage renal disease: Secondary | ICD-10-CM | POA: Diagnosis not present

## 2017-10-20 DIAGNOSIS — R52 Pain, unspecified: Secondary | ICD-10-CM

## 2017-10-20 DIAGNOSIS — R197 Diarrhea, unspecified: Secondary | ICD-10-CM | POA: Diagnosis not present

## 2017-10-22 ENCOUNTER — Encounter: Payer: Medicare Other | Admitting: Podiatry

## 2017-10-22 DIAGNOSIS — D509 Iron deficiency anemia, unspecified: Secondary | ICD-10-CM | POA: Diagnosis not present

## 2017-10-22 DIAGNOSIS — E039 Hypothyroidism, unspecified: Secondary | ICD-10-CM | POA: Diagnosis not present

## 2017-10-22 DIAGNOSIS — D689 Coagulation defect, unspecified: Secondary | ICD-10-CM | POA: Diagnosis not present

## 2017-10-22 DIAGNOSIS — D631 Anemia in chronic kidney disease: Secondary | ICD-10-CM | POA: Diagnosis not present

## 2017-10-22 DIAGNOSIS — E1122 Type 2 diabetes mellitus with diabetic chronic kidney disease: Secondary | ICD-10-CM | POA: Diagnosis not present

## 2017-10-22 DIAGNOSIS — N2581 Secondary hyperparathyroidism of renal origin: Secondary | ICD-10-CM | POA: Diagnosis not present

## 2017-10-22 DIAGNOSIS — R197 Diarrhea, unspecified: Secondary | ICD-10-CM | POA: Diagnosis not present

## 2017-10-22 DIAGNOSIS — N186 End stage renal disease: Secondary | ICD-10-CM | POA: Diagnosis not present

## 2017-10-24 DIAGNOSIS — E1122 Type 2 diabetes mellitus with diabetic chronic kidney disease: Secondary | ICD-10-CM | POA: Diagnosis not present

## 2017-10-24 DIAGNOSIS — E039 Hypothyroidism, unspecified: Secondary | ICD-10-CM | POA: Diagnosis not present

## 2017-10-24 DIAGNOSIS — D509 Iron deficiency anemia, unspecified: Secondary | ICD-10-CM | POA: Diagnosis not present

## 2017-10-24 DIAGNOSIS — N2581 Secondary hyperparathyroidism of renal origin: Secondary | ICD-10-CM | POA: Diagnosis not present

## 2017-10-24 DIAGNOSIS — N186 End stage renal disease: Secondary | ICD-10-CM | POA: Diagnosis not present

## 2017-10-24 DIAGNOSIS — D689 Coagulation defect, unspecified: Secondary | ICD-10-CM | POA: Diagnosis not present

## 2017-10-24 DIAGNOSIS — D631 Anemia in chronic kidney disease: Secondary | ICD-10-CM | POA: Diagnosis not present

## 2017-10-24 DIAGNOSIS — R197 Diarrhea, unspecified: Secondary | ICD-10-CM | POA: Diagnosis not present

## 2017-10-26 NOTE — Progress Notes (Signed)
This encounter was created in error - please disregard.

## 2017-10-27 DIAGNOSIS — E039 Hypothyroidism, unspecified: Secondary | ICD-10-CM | POA: Diagnosis not present

## 2017-10-27 DIAGNOSIS — E1122 Type 2 diabetes mellitus with diabetic chronic kidney disease: Secondary | ICD-10-CM | POA: Diagnosis not present

## 2017-10-27 DIAGNOSIS — D631 Anemia in chronic kidney disease: Secondary | ICD-10-CM | POA: Diagnosis not present

## 2017-10-27 DIAGNOSIS — R197 Diarrhea, unspecified: Secondary | ICD-10-CM | POA: Diagnosis not present

## 2017-10-27 DIAGNOSIS — N186 End stage renal disease: Secondary | ICD-10-CM | POA: Diagnosis not present

## 2017-10-27 DIAGNOSIS — D689 Coagulation defect, unspecified: Secondary | ICD-10-CM | POA: Diagnosis not present

## 2017-10-27 DIAGNOSIS — D509 Iron deficiency anemia, unspecified: Secondary | ICD-10-CM | POA: Diagnosis not present

## 2017-10-27 DIAGNOSIS — N2581 Secondary hyperparathyroidism of renal origin: Secondary | ICD-10-CM | POA: Diagnosis not present

## 2017-10-29 DIAGNOSIS — N2581 Secondary hyperparathyroidism of renal origin: Secondary | ICD-10-CM | POA: Diagnosis not present

## 2017-10-29 DIAGNOSIS — D631 Anemia in chronic kidney disease: Secondary | ICD-10-CM | POA: Diagnosis not present

## 2017-10-29 DIAGNOSIS — N186 End stage renal disease: Secondary | ICD-10-CM | POA: Diagnosis not present

## 2017-10-29 DIAGNOSIS — R197 Diarrhea, unspecified: Secondary | ICD-10-CM | POA: Diagnosis not present

## 2017-10-29 DIAGNOSIS — D689 Coagulation defect, unspecified: Secondary | ICD-10-CM | POA: Diagnosis not present

## 2017-10-29 DIAGNOSIS — E039 Hypothyroidism, unspecified: Secondary | ICD-10-CM | POA: Diagnosis not present

## 2017-10-29 DIAGNOSIS — Z992 Dependence on renal dialysis: Secondary | ICD-10-CM | POA: Diagnosis not present

## 2017-10-29 DIAGNOSIS — D509 Iron deficiency anemia, unspecified: Secondary | ICD-10-CM | POA: Diagnosis not present

## 2017-10-29 DIAGNOSIS — E1122 Type 2 diabetes mellitus with diabetic chronic kidney disease: Secondary | ICD-10-CM | POA: Diagnosis not present

## 2017-10-30 ENCOUNTER — Ambulatory Visit
Admission: RE | Admit: 2017-10-30 | Discharge: 2017-10-30 | Disposition: A | Discharge status: Home / Self Care | Payer: Medicare Other | Source: Ambulatory Visit | Attending: Nephrology | Admitting: Nephrology

## 2017-10-30 DIAGNOSIS — R221 Localized swelling, mass and lump, neck: Secondary | ICD-10-CM | POA: Diagnosis not present

## 2017-10-30 DIAGNOSIS — Z125 Encounter for screening for malignant neoplasm of prostate: Secondary | ICD-10-CM | POA: Diagnosis not present

## 2017-10-30 DIAGNOSIS — R52 Pain, unspecified: Secondary | ICD-10-CM

## 2017-10-30 DIAGNOSIS — E782 Mixed hyperlipidemia: Secondary | ICD-10-CM | POA: Diagnosis not present

## 2017-10-31 DIAGNOSIS — N186 End stage renal disease: Secondary | ICD-10-CM | POA: Diagnosis not present

## 2017-10-31 DIAGNOSIS — D689 Coagulation defect, unspecified: Secondary | ICD-10-CM | POA: Diagnosis not present

## 2017-10-31 DIAGNOSIS — E039 Hypothyroidism, unspecified: Secondary | ICD-10-CM | POA: Diagnosis not present

## 2017-10-31 DIAGNOSIS — E1122 Type 2 diabetes mellitus with diabetic chronic kidney disease: Secondary | ICD-10-CM | POA: Diagnosis not present

## 2017-10-31 DIAGNOSIS — D631 Anemia in chronic kidney disease: Secondary | ICD-10-CM | POA: Diagnosis not present

## 2017-10-31 DIAGNOSIS — D509 Iron deficiency anemia, unspecified: Secondary | ICD-10-CM | POA: Diagnosis not present

## 2017-10-31 DIAGNOSIS — R52 Pain, unspecified: Secondary | ICD-10-CM | POA: Diagnosis not present

## 2017-10-31 DIAGNOSIS — N2581 Secondary hyperparathyroidism of renal origin: Secondary | ICD-10-CM | POA: Diagnosis not present

## 2017-11-03 DIAGNOSIS — N2581 Secondary hyperparathyroidism of renal origin: Secondary | ICD-10-CM | POA: Diagnosis not present

## 2017-11-03 DIAGNOSIS — D509 Iron deficiency anemia, unspecified: Secondary | ICD-10-CM | POA: Diagnosis not present

## 2017-11-03 DIAGNOSIS — E1122 Type 2 diabetes mellitus with diabetic chronic kidney disease: Secondary | ICD-10-CM | POA: Diagnosis not present

## 2017-11-03 DIAGNOSIS — R52 Pain, unspecified: Secondary | ICD-10-CM | POA: Diagnosis not present

## 2017-11-03 DIAGNOSIS — N186 End stage renal disease: Secondary | ICD-10-CM | POA: Diagnosis not present

## 2017-11-03 DIAGNOSIS — E039 Hypothyroidism, unspecified: Secondary | ICD-10-CM | POA: Diagnosis not present

## 2017-11-03 DIAGNOSIS — D631 Anemia in chronic kidney disease: Secondary | ICD-10-CM | POA: Diagnosis not present

## 2017-11-03 DIAGNOSIS — D689 Coagulation defect, unspecified: Secondary | ICD-10-CM | POA: Diagnosis not present

## 2017-11-04 ENCOUNTER — Telehealth: Payer: Self-pay | Admitting: Cardiology

## 2017-11-04 NOTE — Telephone Encounter (Signed)
Received records from [facility] on 11/04/17, Appt 11/13/17 @ 10:00AM. [place]

## 2017-11-05 DIAGNOSIS — E1122 Type 2 diabetes mellitus with diabetic chronic kidney disease: Secondary | ICD-10-CM | POA: Diagnosis not present

## 2017-11-05 DIAGNOSIS — D689 Coagulation defect, unspecified: Secondary | ICD-10-CM | POA: Diagnosis not present

## 2017-11-05 DIAGNOSIS — R52 Pain, unspecified: Secondary | ICD-10-CM | POA: Diagnosis not present

## 2017-11-05 DIAGNOSIS — N2581 Secondary hyperparathyroidism of renal origin: Secondary | ICD-10-CM | POA: Diagnosis not present

## 2017-11-05 DIAGNOSIS — N186 End stage renal disease: Secondary | ICD-10-CM | POA: Diagnosis not present

## 2017-11-05 DIAGNOSIS — E039 Hypothyroidism, unspecified: Secondary | ICD-10-CM | POA: Diagnosis not present

## 2017-11-05 DIAGNOSIS — D631 Anemia in chronic kidney disease: Secondary | ICD-10-CM | POA: Diagnosis not present

## 2017-11-05 DIAGNOSIS — D509 Iron deficiency anemia, unspecified: Secondary | ICD-10-CM | POA: Diagnosis not present

## 2017-11-07 DIAGNOSIS — R52 Pain, unspecified: Secondary | ICD-10-CM | POA: Diagnosis not present

## 2017-11-07 DIAGNOSIS — N186 End stage renal disease: Secondary | ICD-10-CM | POA: Diagnosis not present

## 2017-11-07 DIAGNOSIS — N2581 Secondary hyperparathyroidism of renal origin: Secondary | ICD-10-CM | POA: Diagnosis not present

## 2017-11-07 DIAGNOSIS — E039 Hypothyroidism, unspecified: Secondary | ICD-10-CM | POA: Diagnosis not present

## 2017-11-07 DIAGNOSIS — E1122 Type 2 diabetes mellitus with diabetic chronic kidney disease: Secondary | ICD-10-CM | POA: Diagnosis not present

## 2017-11-07 DIAGNOSIS — D631 Anemia in chronic kidney disease: Secondary | ICD-10-CM | POA: Diagnosis not present

## 2017-11-07 DIAGNOSIS — D689 Coagulation defect, unspecified: Secondary | ICD-10-CM | POA: Diagnosis not present

## 2017-11-07 DIAGNOSIS — D509 Iron deficiency anemia, unspecified: Secondary | ICD-10-CM | POA: Diagnosis not present

## 2017-11-10 DIAGNOSIS — N186 End stage renal disease: Secondary | ICD-10-CM | POA: Diagnosis not present

## 2017-11-10 DIAGNOSIS — D689 Coagulation defect, unspecified: Secondary | ICD-10-CM | POA: Diagnosis not present

## 2017-11-10 DIAGNOSIS — N2581 Secondary hyperparathyroidism of renal origin: Secondary | ICD-10-CM | POA: Diagnosis not present

## 2017-11-10 DIAGNOSIS — D509 Iron deficiency anemia, unspecified: Secondary | ICD-10-CM | POA: Diagnosis not present

## 2017-11-10 DIAGNOSIS — R52 Pain, unspecified: Secondary | ICD-10-CM | POA: Diagnosis not present

## 2017-11-10 DIAGNOSIS — E039 Hypothyroidism, unspecified: Secondary | ICD-10-CM | POA: Diagnosis not present

## 2017-11-10 DIAGNOSIS — E1122 Type 2 diabetes mellitus with diabetic chronic kidney disease: Secondary | ICD-10-CM | POA: Diagnosis not present

## 2017-11-10 DIAGNOSIS — D631 Anemia in chronic kidney disease: Secondary | ICD-10-CM | POA: Diagnosis not present

## 2017-11-12 DIAGNOSIS — D509 Iron deficiency anemia, unspecified: Secondary | ICD-10-CM | POA: Diagnosis not present

## 2017-11-12 DIAGNOSIS — D631 Anemia in chronic kidney disease: Secondary | ICD-10-CM | POA: Diagnosis not present

## 2017-11-12 DIAGNOSIS — E039 Hypothyroidism, unspecified: Secondary | ICD-10-CM | POA: Diagnosis not present

## 2017-11-12 DIAGNOSIS — R52 Pain, unspecified: Secondary | ICD-10-CM | POA: Diagnosis not present

## 2017-11-12 DIAGNOSIS — E1122 Type 2 diabetes mellitus with diabetic chronic kidney disease: Secondary | ICD-10-CM | POA: Diagnosis not present

## 2017-11-12 DIAGNOSIS — D689 Coagulation defect, unspecified: Secondary | ICD-10-CM | POA: Diagnosis not present

## 2017-11-12 DIAGNOSIS — N186 End stage renal disease: Secondary | ICD-10-CM | POA: Diagnosis not present

## 2017-11-12 DIAGNOSIS — N2581 Secondary hyperparathyroidism of renal origin: Secondary | ICD-10-CM | POA: Diagnosis not present

## 2017-11-12 NOTE — Progress Notes (Signed)
Cardiology Office Note:    Date:  11/13/2017   ID:  Oris Drone, DOB [birth year], MRN [id number]  PCP:  Leeroy Cha, MD  Cardiologist:  Buford Dresser, MD PhD  Referring MD: Leeroy Cha,*   CC: history of CAD  History of Present Illness:    Tim Becker is a 52 y.o. male with a hx of CAD, hypertension, asthma, obstructive sleep apnea, type II diabetes, end stage renal disease on dialysis, history of amputation, and obesity (BMI 73) who is seen as a new consult at the request of Dr. Fara Olden for evaluation and management of history of CAD.  I cannot see his prior cath reports or stress test, but per Care Everywhere notes he had a stent in 2009. He reports that these were done in [place].  Patient's concerns today: he is trying to be evaluated for kidney transplant. He reports that he was having med rec done at his visit and noted his prescription for NG. Recommendation was for cardiac evaluation given his history. Has not seen a cardiologist in the office for several years.   Has had high blood pressure since he was [age] years old--had complications after routine tonsil/adenoid surgery. He doesn't think that he was on medication until in his mid to late 20s, was and off medication depending on where his weight was at the time. Was diagnosed as a diabetic around the age of 80. In his late 34s, started having issues related to his leg and as a part of the workup, noted to have abnormal heart testing (unclear what the test was). Denies any angina, had been very active and playing sports. Ended up with a stent in 2009.   Denies ever having typical cardiac chest pain. He feels tight with his breathing when he is due for dialysis, but no chest discomfort. Has had bilateral shoulder sharp pain recently, intermittent, sometimes related to lifting/exertion.   History of renal cell carcinoma s/p left kidney nephrectomy. S/P right BKA 2/2 chronic osteomyelitis. On  hemodialysis for ESRD, working on weight loss.  Not interested in nutrition, precontemplative about making dietary changes. Acknowledges that his diet is not ideal. He states that he understands dietary recommendations given his heart disease, diabetes, and kidney disease. Used to be more active, was walking at the gym but has not done it recently. No limiting symptoms. Can climb stairs, do routine household activities, walk on level ground, etc without issues. Climbed two flights of stairs today without issue.  He reports that his blood pressure is very labile in his dialysis sessions. His systolic blood pressure can be over 200 when he starts his session, but sometimes his systolic falls to less than 100 mmHg by the end of the session. He is not symptomatic with the high pressures but feels lightheaded with the low pressures.   He is not currently on a statin, but he reports being on one in the past and not having issues with it.   Past Medical History:  Diagnosis Date  . Anemia   . Arthritis   . Cancer [facility])    Renal Tumor  . CKD (chronic kidney disease)   . Coronary artery disease   . Diabetes ([place])   . DM (diabetes mellitus) with complications ([place])   . ESRD (end stage renal disease) ([place])    Pre- dialysis  . Hypertension   . Left kidney mass   . Neuropathy   . Obesity   . Osteomyelitis, chronic, ankle or foot ([place])   .  PONV (postoperative nausea and vomiting)   . Renal insufficiency    Patient is on Dialysis and receives M,W and F.  . S/P BKA (below knee amputation) [facility]) June 2016   Right , [place], [place]  . Sleep apnea    CPAP  . Thyroid disease     Past Surgical History:  Procedure Laterality Date  . BASCILIC VEIN TRANSPOSITION Left 02/17/2015   Procedure: BASCILIC VEIN TRANSPOSITION;  Surgeon: Katha Cabal, MD;  Location: [facility] ORS;  Service: Vascular;  Laterality: Left;  . BELOW KNEE LEG AMPUTATION Right   . CORONARY ANGIOPLASTY WITH STENT PLACEMENT    . EYE  SURGERY    . FOOT SURGERY     Multiple R foot surgery for infection and charcot foot  . I&D EXTREMITY Left 04/16/2016   Procedure: IRRIGATION AND DEBRIDEMENT EXTREMITY/GREAT TOE AMP.;  Surgeon: Edrick Kins, DPM;  Location: [place];  Service: Podiatry;  Laterality: Left;  . JOINT REPLACEMENT    . LAPAROSCOPIC NEPHRECTOMY, HAND ASSISTED Left 07/11/2015   Procedure: HAND ASSISTED LAPAROSCOPIC NEPHRECTOMY;  Surgeon: Hollice Espy, MD;  Location: [facility] ORS;  Service: Urology;  Laterality: Left;  . PERIPHERAL VASCULAR CATHETERIZATION Left 12/06/2014   Procedure: A/V Shuntogram/Fistulagram;  Surgeon: Katha Cabal, MD;  Location: [place] [facility];  Service: Cardiovascular;  Laterality: Left;  . PERIPHERAL VASCULAR CATHETERIZATION Left 12/06/2014   Procedure: A/V Shunt Intervention;  Surgeon: Katha Cabal, MD;  Location: [place] [facility];  Service: Cardiovascular;  Laterality: Left;  . TONSILLECTOMY      Current Medications: Current Outpatient Medications on File Prior to Visit  Medication Sig  . acetaminophen (TYLENOL) 500 MG tablet Take 1,000 mg by mouth every 6 (six) hours as needed for headache (pain).  . carvedilol (COREG) 12.5 MG tablet Take 12.5 mg by mouth 2 (two) times daily with a meal.  . insulin glargine (LANTUS) 100 unit/mL SOPN Inject 80 Units into the skin daily before breakfast.  . Insulin Lispro (HUMALOG KWIKPEN) 200 UNIT/ML SOPN Inject 10-20 Units into the skin See admin instructions. Inject 10-20 units (per sliding scale) two - three times daily with meals  . lanthanum (FOSRENOL) 500 MG chewable tablet Chew 1,000 mg by mouth daily.  [place] PRESCRIPTION MEDICATION Inhale into the lungs at bedtime. CPAP   No current facility-administered medications on file prior to visit.      Allergies:   Patient has no known allergies.   Social History   Socioeconomic History  . Marital status: Married    Spouse name: Not on file  . Number of children: Not on file  . Years of  education: Not on file  . Highest education level: Not on file  Occupational History  . Not on file  Social Needs  . Financial resource strain: Not on file  . Food insecurity:    Worry: Not on file    Inability: Not on file  . Transportation needs:    Medical: Not on file    Non-medical: Not on file  Tobacco Use  . Smoking status: Never Smoker  . Smokeless tobacco: Never Used  Substance and Sexual Activity  . Alcohol use: No  . Drug use: No  . Sexual activity: Not on file  Lifestyle  . Physical activity:    Days per week: Not on file    Minutes per session: Not on file  . Stress: Not on file  Relationships  . Social connections:    Talks on phone: Not on  file    Gets together: Not on file    Attends religious service: Not on file    Active member of club or organization: Not on file    Attends meetings of clubs or organizations: Not on file    Relationship status: Not on file  Other Topics Concern  . Not on file  Social History Narrative  . Not on file     Family History: The patient's family history includes Breast cancer in his mother; Diabetes in his mother; Kidney disease in his mother. Mother had MI, breast cancer, diabetes, kidney disease, amputation, passed away at age 18.   ROS:   Please see the history of present illness.  Additional pertinent ROS: Review of Systems  Constitutional: Negative for chills, fever and malaise/fatigue.  HENT: Negative for ear pain and hearing loss.   Eyes: Negative for blurred vision and pain.  Respiratory: Negative for cough, hemoptysis and wheezing.   Cardiovascular: Negative for chest pain, palpitations, orthopnea, claudication, leg swelling and PND.  Gastrointestinal: Negative for abdominal pain, blood in stool and melena.  Genitourinary: Negative for dysuria and hematuria.  Musculoskeletal: Negative for falls and myalgias.  Skin: Negative for rash.  Neurological: Negative for focal weakness and loss of consciousness.    Endo/Heme/Allergies: Does not bruise/bleed easily.     EKGs/Labs/Other Studies Reviewed:    The following studies were reviewed today: Care Everywhere results Stress Echo 02/28/2014--listed but I cannot see results  EKG:  EKG is ordered today.  The ekg ordered today demonstrates normal sinus rhythm, poor R wave progression.  Recent Labs: 12/15/2016: BUN 57; Creatinine, Ser 11.93; Hemoglobin 11.1; Platelets 265; Potassium 4.7; Sodium 140  Recent Lipid Panel No results found for: CHOL, TRIG, HDL, CHOLHDL, VLDL, LDLCALC, LDLDIRECT Per [facility] report: Lipids 10/30/17 Tchol 160, LDL 68, HDL 22, TG 352 A1c 8.6 TSH 4.430  Physical Exam:    VS:  BP (!) 152/74   Pulse 86   Ht 5\' 8"  (1.727 m)   Wt 261 lb 12.8 oz (118.8 kg)   BMI 39.81 kg/m     Wt Readings from Last 3 Encounters:  11/13/17 261 lb 12.8 oz (118.8 kg)  10/29/16 248 lb (112.5 kg)  04/17/16 248 lb 0.3 oz (112.5 kg)     GEN: Well nourished, well developed in no acute distress HEENT: Normal NECK: No JVD; No carotid bruits LYMPHATICS: No lymphadenopathy CARDIAC: regular rhythm, normal S1 and S2, no murmurs, rubs, gallops. Radial and DP pulses 2+ bilaterally. RESPIRATORY:  Clear to auscultation without rales, wheezing or rhonchi  ABDOMEN: Soft, non-tender, non-distended MUSCULOSKELETAL:  Right BKA. No LLE edema.  SKIN: Warm and dry NEUROLOGIC:  Alert and oriented x 3 PSYCHIATRIC:  Normal affect   ASSESSMENT:    1. Essential hypertension   2. CAD in native artery   3. ESRD (end stage renal disease) on dialysis ([facility])   4. Class 2 severe obesity due to excess calories with serious comorbidity and body mass index (BMI) of 39.0 to 39.9 in adult ([place])   5. Counseling on health promotion and disease prevention    PLAN:    1. History of CAD: no active symptoms. No limitations on activity.  -medical optimization: not currently on a statin. Even with low LDL at baseline, he would still benefit from statin use. Will start  atorvastatin 40 mg daily. -continue carvedilol -defer ACEI/ARB to nephrology, concern would be for worsening symptomatic hypotension with dialysis -typically recommend aspirin 81 mg daily long term. He wishes to  minimizes medications. Favor statin over aspirin at this time, will readdress at future visits.  Risk factors: Obesity: discussed lifestyle, below.  ESRD: on HD, being evaluated to see if he is a kidney transplant candidate Hypertension: given lability on dialysis days and symptomatic hypotension, will not further adjust carvedilol today Diabetes: on insulin, management per PCP Lipids: as above Tobacco use: none Lifestyle: counseled on nutrition and exercise. Precontemplative, declines referral  Plan for follow up: 1 year or sooner PRN  Medication Adjustments/Labs and Tests Ordered: Current medicines are reviewed at length with the patient today.  Concerns regarding medicines are outlined above.  Orders Placed This Encounter  Procedures  . EKG 12-Lead   Meds ordered this encounter  Medications  . atorvastatin (LIPITOR) 40 MG tablet    Sig: Take 1 tablet (40 mg total) by mouth daily.    Dispense:  30 tablet    Refill:  11    Patient Instructions  Medication Instructions: Your physician recommends that you make the following changes to your medication. Start: Atorvastatin 40 mg daily    If you need a refill on your cardiac medications before your next appointment, please call your pharmacy.   Labwork: None  Procedures/Testing: None  Follow-Up: Your physician wants you to follow-up in 1 year with Dr. Harrell Gave. You will receive a reminder letter in the mail two months in advance. If you don't receive a letter, please call our office at [phone] to schedule this follow-up appointment.   Special Instructions:    Thank you for choosing [facility] at [facility]!!       Signed, Buford Dresser, MD PhD 11/13/2017 2:11 PM    [place]

## 2017-11-13 ENCOUNTER — Encounter: Payer: Self-pay | Admitting: Cardiology

## 2017-11-13 ENCOUNTER — Ambulatory Visit (INDEPENDENT_AMBULATORY_CARE_PROVIDER_SITE_OTHER): Payer: Medicare Other | Admitting: Cardiology

## 2017-11-13 VITALS — BP 152/74 | HR 86 | Ht 68.0 in | Wt 261.8 lb

## 2017-11-13 DIAGNOSIS — I2510 Atherosclerotic heart disease of native coronary artery without angina pectoris: Secondary | ICD-10-CM | POA: Diagnosis not present

## 2017-11-13 DIAGNOSIS — N186 End stage renal disease: Secondary | ICD-10-CM | POA: Diagnosis not present

## 2017-11-13 DIAGNOSIS — Z7189 Other specified counseling: Secondary | ICD-10-CM | POA: Diagnosis not present

## 2017-11-13 DIAGNOSIS — Z992 Dependence on renal dialysis: Secondary | ICD-10-CM

## 2017-11-13 DIAGNOSIS — Z6839 Body mass index (BMI) 39.0-39.9, adult: Secondary | ICD-10-CM

## 2017-11-13 DIAGNOSIS — I10 Essential (primary) hypertension: Secondary | ICD-10-CM | POA: Diagnosis not present

## 2017-11-13 MED ORDER — ATORVASTATIN CALCIUM 40 MG PO TABS: 40.0000 mg | ORAL_TABLET | Freq: Every day | ORAL | 11 refills | Status: DC

## 2017-11-13 NOTE — Patient Instructions (Signed)
Medication Instructions: Your physician recommends that you make the following changes to your medication. Start: Atorvastatin 40 mg daily    If you need a refill on your cardiac medications before your next appointment, please call your pharmacy.   Labwork: None  Procedures/Testing: None  Follow-Up: Your physician wants you to follow-up in 1 year with Dr. Harrell Gave. You will receive a reminder letter in the mail two months in advance. If you don't receive a letter, please call our office at [phone] to schedule this follow-up appointment.   Special Instructions:    Thank you for choosing [facility] at [facility]!!

## 2017-11-14 DIAGNOSIS — D631 Anemia in chronic kidney disease: Secondary | ICD-10-CM | POA: Diagnosis not present

## 2017-11-14 DIAGNOSIS — D509 Iron deficiency anemia, unspecified: Secondary | ICD-10-CM | POA: Diagnosis not present

## 2017-11-14 DIAGNOSIS — N2581 Secondary hyperparathyroidism of renal origin: Secondary | ICD-10-CM | POA: Diagnosis not present

## 2017-11-14 DIAGNOSIS — R52 Pain, unspecified: Secondary | ICD-10-CM | POA: Diagnosis not present

## 2017-11-14 DIAGNOSIS — E1122 Type 2 diabetes mellitus with diabetic chronic kidney disease: Secondary | ICD-10-CM | POA: Diagnosis not present

## 2017-11-14 DIAGNOSIS — D689 Coagulation defect, unspecified: Secondary | ICD-10-CM | POA: Diagnosis not present

## 2017-11-14 DIAGNOSIS — E039 Hypothyroidism, unspecified: Secondary | ICD-10-CM | POA: Diagnosis not present

## 2017-11-14 DIAGNOSIS — N186 End stage renal disease: Secondary | ICD-10-CM | POA: Diagnosis not present

## 2017-11-17 DIAGNOSIS — E1122 Type 2 diabetes mellitus with diabetic chronic kidney disease: Secondary | ICD-10-CM | POA: Diagnosis not present

## 2017-11-17 DIAGNOSIS — N186 End stage renal disease: Secondary | ICD-10-CM | POA: Diagnosis not present

## 2017-11-17 DIAGNOSIS — E039 Hypothyroidism, unspecified: Secondary | ICD-10-CM | POA: Diagnosis not present

## 2017-11-17 DIAGNOSIS — D689 Coagulation defect, unspecified: Secondary | ICD-10-CM | POA: Diagnosis not present

## 2017-11-17 DIAGNOSIS — D509 Iron deficiency anemia, unspecified: Secondary | ICD-10-CM | POA: Diagnosis not present

## 2017-11-17 DIAGNOSIS — N2581 Secondary hyperparathyroidism of renal origin: Secondary | ICD-10-CM | POA: Diagnosis not present

## 2017-11-17 DIAGNOSIS — R52 Pain, unspecified: Secondary | ICD-10-CM | POA: Diagnosis not present

## 2017-11-17 DIAGNOSIS — D631 Anemia in chronic kidney disease: Secondary | ICD-10-CM | POA: Diagnosis not present

## 2017-11-19 DIAGNOSIS — D509 Iron deficiency anemia, unspecified: Secondary | ICD-10-CM | POA: Diagnosis not present

## 2017-11-19 DIAGNOSIS — R52 Pain, unspecified: Secondary | ICD-10-CM | POA: Diagnosis not present

## 2017-11-19 DIAGNOSIS — E1122 Type 2 diabetes mellitus with diabetic chronic kidney disease: Secondary | ICD-10-CM | POA: Diagnosis not present

## 2017-11-19 DIAGNOSIS — N2581 Secondary hyperparathyroidism of renal origin: Secondary | ICD-10-CM | POA: Diagnosis not present

## 2017-11-19 DIAGNOSIS — D631 Anemia in chronic kidney disease: Secondary | ICD-10-CM | POA: Diagnosis not present

## 2017-11-19 DIAGNOSIS — N186 End stage renal disease: Secondary | ICD-10-CM | POA: Diagnosis not present

## 2017-11-19 DIAGNOSIS — D689 Coagulation defect, unspecified: Secondary | ICD-10-CM | POA: Diagnosis not present

## 2017-11-19 DIAGNOSIS — E039 Hypothyroidism, unspecified: Secondary | ICD-10-CM | POA: Diagnosis not present

## 2017-11-21 DIAGNOSIS — N2581 Secondary hyperparathyroidism of renal origin: Secondary | ICD-10-CM | POA: Diagnosis not present

## 2017-11-21 DIAGNOSIS — R52 Pain, unspecified: Secondary | ICD-10-CM | POA: Diagnosis not present

## 2017-11-21 DIAGNOSIS — E039 Hypothyroidism, unspecified: Secondary | ICD-10-CM | POA: Diagnosis not present

## 2017-11-21 DIAGNOSIS — D509 Iron deficiency anemia, unspecified: Secondary | ICD-10-CM | POA: Diagnosis not present

## 2017-11-21 DIAGNOSIS — N186 End stage renal disease: Secondary | ICD-10-CM | POA: Diagnosis not present

## 2017-11-21 DIAGNOSIS — D689 Coagulation defect, unspecified: Secondary | ICD-10-CM | POA: Diagnosis not present

## 2017-11-21 DIAGNOSIS — E1122 Type 2 diabetes mellitus with diabetic chronic kidney disease: Secondary | ICD-10-CM | POA: Diagnosis not present

## 2017-11-21 DIAGNOSIS — D631 Anemia in chronic kidney disease: Secondary | ICD-10-CM | POA: Diagnosis not present

## 2017-11-24 DIAGNOSIS — E039 Hypothyroidism, unspecified: Secondary | ICD-10-CM | POA: Diagnosis not present

## 2017-11-24 DIAGNOSIS — N186 End stage renal disease: Secondary | ICD-10-CM | POA: Diagnosis not present

## 2017-11-24 DIAGNOSIS — N2581 Secondary hyperparathyroidism of renal origin: Secondary | ICD-10-CM | POA: Diagnosis not present

## 2017-11-24 DIAGNOSIS — D509 Iron deficiency anemia, unspecified: Secondary | ICD-10-CM | POA: Diagnosis not present

## 2017-11-24 DIAGNOSIS — E1122 Type 2 diabetes mellitus with diabetic chronic kidney disease: Secondary | ICD-10-CM | POA: Diagnosis not present

## 2017-11-24 DIAGNOSIS — D631 Anemia in chronic kidney disease: Secondary | ICD-10-CM | POA: Diagnosis not present

## 2017-11-24 DIAGNOSIS — D689 Coagulation defect, unspecified: Secondary | ICD-10-CM | POA: Diagnosis not present

## 2017-11-24 DIAGNOSIS — R52 Pain, unspecified: Secondary | ICD-10-CM | POA: Diagnosis not present

## 2017-11-26 DIAGNOSIS — D509 Iron deficiency anemia, unspecified: Secondary | ICD-10-CM | POA: Diagnosis not present

## 2017-11-26 DIAGNOSIS — R52 Pain, unspecified: Secondary | ICD-10-CM | POA: Diagnosis not present

## 2017-11-26 DIAGNOSIS — N2581 Secondary hyperparathyroidism of renal origin: Secondary | ICD-10-CM | POA: Diagnosis not present

## 2017-11-26 DIAGNOSIS — E1122 Type 2 diabetes mellitus with diabetic chronic kidney disease: Secondary | ICD-10-CM | POA: Diagnosis not present

## 2017-11-26 DIAGNOSIS — D689 Coagulation defect, unspecified: Secondary | ICD-10-CM | POA: Diagnosis not present

## 2017-11-26 DIAGNOSIS — E039 Hypothyroidism, unspecified: Secondary | ICD-10-CM | POA: Diagnosis not present

## 2017-11-26 DIAGNOSIS — N186 End stage renal disease: Secondary | ICD-10-CM | POA: Diagnosis not present

## 2017-11-26 DIAGNOSIS — D631 Anemia in chronic kidney disease: Secondary | ICD-10-CM | POA: Diagnosis not present

## 2017-11-28 DIAGNOSIS — D689 Coagulation defect, unspecified: Secondary | ICD-10-CM | POA: Diagnosis not present

## 2017-11-28 DIAGNOSIS — E039 Hypothyroidism, unspecified: Secondary | ICD-10-CM | POA: Diagnosis not present

## 2017-11-28 DIAGNOSIS — E1122 Type 2 diabetes mellitus with diabetic chronic kidney disease: Secondary | ICD-10-CM | POA: Diagnosis not present

## 2017-11-28 DIAGNOSIS — N2581 Secondary hyperparathyroidism of renal origin: Secondary | ICD-10-CM | POA: Diagnosis not present

## 2017-11-28 DIAGNOSIS — D509 Iron deficiency anemia, unspecified: Secondary | ICD-10-CM | POA: Diagnosis not present

## 2017-11-28 DIAGNOSIS — N186 End stage renal disease: Secondary | ICD-10-CM | POA: Diagnosis not present

## 2017-11-28 DIAGNOSIS — R52 Pain, unspecified: Secondary | ICD-10-CM | POA: Diagnosis not present

## 2017-11-28 DIAGNOSIS — D631 Anemia in chronic kidney disease: Secondary | ICD-10-CM | POA: Diagnosis not present

## 2017-11-29 DIAGNOSIS — N186 End stage renal disease: Secondary | ICD-10-CM | POA: Diagnosis not present

## 2017-11-29 DIAGNOSIS — Z992 Dependence on renal dialysis: Secondary | ICD-10-CM | POA: Diagnosis not present

## 2017-11-29 DIAGNOSIS — E1122 Type 2 diabetes mellitus with diabetic chronic kidney disease: Secondary | ICD-10-CM | POA: Diagnosis not present

## 2017-12-01 DIAGNOSIS — D509 Iron deficiency anemia, unspecified: Secondary | ICD-10-CM | POA: Diagnosis not present

## 2017-12-01 DIAGNOSIS — E1122 Type 2 diabetes mellitus with diabetic chronic kidney disease: Secondary | ICD-10-CM | POA: Diagnosis not present

## 2017-12-01 DIAGNOSIS — R197 Diarrhea, unspecified: Secondary | ICD-10-CM | POA: Diagnosis not present

## 2017-12-01 DIAGNOSIS — E039 Hypothyroidism, unspecified: Secondary | ICD-10-CM | POA: Diagnosis not present

## 2017-12-01 DIAGNOSIS — D631 Anemia in chronic kidney disease: Secondary | ICD-10-CM | POA: Diagnosis not present

## 2017-12-01 DIAGNOSIS — N186 End stage renal disease: Secondary | ICD-10-CM | POA: Diagnosis not present

## 2017-12-01 DIAGNOSIS — N2581 Secondary hyperparathyroidism of renal origin: Secondary | ICD-10-CM | POA: Diagnosis not present

## 2017-12-01 DIAGNOSIS — R52 Pain, unspecified: Secondary | ICD-10-CM | POA: Diagnosis not present

## 2017-12-01 DIAGNOSIS — D689 Coagulation defect, unspecified: Secondary | ICD-10-CM | POA: Diagnosis not present

## 2017-12-02 ENCOUNTER — Other Ambulatory Visit: Payer: Self-pay

## 2017-12-02 DIAGNOSIS — I2510 Atherosclerotic heart disease of native coronary artery without angina pectoris: Secondary | ICD-10-CM

## 2017-12-02 MED ORDER — ATORVASTATIN CALCIUM 40 MG PO TABS: 40.0000 mg | ORAL_TABLET | Freq: Every day | ORAL | 11 refills | Status: DC

## 2017-12-03 DIAGNOSIS — R197 Diarrhea, unspecified: Secondary | ICD-10-CM | POA: Diagnosis not present

## 2017-12-03 DIAGNOSIS — D631 Anemia in chronic kidney disease: Secondary | ICD-10-CM | POA: Diagnosis not present

## 2017-12-03 DIAGNOSIS — D689 Coagulation defect, unspecified: Secondary | ICD-10-CM | POA: Diagnosis not present

## 2017-12-03 DIAGNOSIS — E039 Hypothyroidism, unspecified: Secondary | ICD-10-CM | POA: Diagnosis not present

## 2017-12-03 DIAGNOSIS — N2581 Secondary hyperparathyroidism of renal origin: Secondary | ICD-10-CM | POA: Diagnosis not present

## 2017-12-03 DIAGNOSIS — D509 Iron deficiency anemia, unspecified: Secondary | ICD-10-CM | POA: Diagnosis not present

## 2017-12-03 DIAGNOSIS — N186 End stage renal disease: Secondary | ICD-10-CM | POA: Diagnosis not present

## 2017-12-03 DIAGNOSIS — E1122 Type 2 diabetes mellitus with diabetic chronic kidney disease: Secondary | ICD-10-CM | POA: Diagnosis not present

## 2017-12-03 DIAGNOSIS — R52 Pain, unspecified: Secondary | ICD-10-CM | POA: Diagnosis not present

## 2017-12-05 DIAGNOSIS — N186 End stage renal disease: Secondary | ICD-10-CM | POA: Diagnosis not present

## 2017-12-05 DIAGNOSIS — E039 Hypothyroidism, unspecified: Secondary | ICD-10-CM | POA: Diagnosis not present

## 2017-12-05 DIAGNOSIS — R52 Pain, unspecified: Secondary | ICD-10-CM | POA: Diagnosis not present

## 2017-12-05 DIAGNOSIS — D631 Anemia in chronic kidney disease: Secondary | ICD-10-CM | POA: Diagnosis not present

## 2017-12-05 DIAGNOSIS — D509 Iron deficiency anemia, unspecified: Secondary | ICD-10-CM | POA: Diagnosis not present

## 2017-12-05 DIAGNOSIS — N2581 Secondary hyperparathyroidism of renal origin: Secondary | ICD-10-CM | POA: Diagnosis not present

## 2017-12-05 DIAGNOSIS — E1122 Type 2 diabetes mellitus with diabetic chronic kidney disease: Secondary | ICD-10-CM | POA: Diagnosis not present

## 2017-12-05 DIAGNOSIS — D689 Coagulation defect, unspecified: Secondary | ICD-10-CM | POA: Diagnosis not present

## 2017-12-05 DIAGNOSIS — R197 Diarrhea, unspecified: Secondary | ICD-10-CM | POA: Diagnosis not present

## 2017-12-08 DIAGNOSIS — N186 End stage renal disease: Secondary | ICD-10-CM | POA: Diagnosis not present

## 2017-12-08 DIAGNOSIS — D631 Anemia in chronic kidney disease: Secondary | ICD-10-CM | POA: Diagnosis not present

## 2017-12-08 DIAGNOSIS — E1122 Type 2 diabetes mellitus with diabetic chronic kidney disease: Secondary | ICD-10-CM | POA: Diagnosis not present

## 2017-12-08 DIAGNOSIS — D689 Coagulation defect, unspecified: Secondary | ICD-10-CM | POA: Diagnosis not present

## 2017-12-08 DIAGNOSIS — R197 Diarrhea, unspecified: Secondary | ICD-10-CM | POA: Diagnosis not present

## 2017-12-08 DIAGNOSIS — E039 Hypothyroidism, unspecified: Secondary | ICD-10-CM | POA: Diagnosis not present

## 2017-12-08 DIAGNOSIS — R52 Pain, unspecified: Secondary | ICD-10-CM | POA: Diagnosis not present

## 2017-12-08 DIAGNOSIS — N2581 Secondary hyperparathyroidism of renal origin: Secondary | ICD-10-CM | POA: Diagnosis not present

## 2017-12-08 DIAGNOSIS — D509 Iron deficiency anemia, unspecified: Secondary | ICD-10-CM | POA: Diagnosis not present

## 2017-12-10 DIAGNOSIS — D631 Anemia in chronic kidney disease: Secondary | ICD-10-CM | POA: Diagnosis not present

## 2017-12-10 DIAGNOSIS — E039 Hypothyroidism, unspecified: Secondary | ICD-10-CM | POA: Diagnosis not present

## 2017-12-10 DIAGNOSIS — D509 Iron deficiency anemia, unspecified: Secondary | ICD-10-CM | POA: Diagnosis not present

## 2017-12-10 DIAGNOSIS — D689 Coagulation defect, unspecified: Secondary | ICD-10-CM | POA: Diagnosis not present

## 2017-12-10 DIAGNOSIS — R52 Pain, unspecified: Secondary | ICD-10-CM | POA: Diagnosis not present

## 2017-12-10 DIAGNOSIS — R197 Diarrhea, unspecified: Secondary | ICD-10-CM | POA: Diagnosis not present

## 2017-12-10 DIAGNOSIS — N186 End stage renal disease: Secondary | ICD-10-CM | POA: Diagnosis not present

## 2017-12-10 DIAGNOSIS — N2581 Secondary hyperparathyroidism of renal origin: Secondary | ICD-10-CM | POA: Diagnosis not present

## 2017-12-10 DIAGNOSIS — E1122 Type 2 diabetes mellitus with diabetic chronic kidney disease: Secondary | ICD-10-CM | POA: Diagnosis not present

## 2017-12-12 DIAGNOSIS — D631 Anemia in chronic kidney disease: Secondary | ICD-10-CM | POA: Diagnosis not present

## 2017-12-12 DIAGNOSIS — D689 Coagulation defect, unspecified: Secondary | ICD-10-CM | POA: Diagnosis not present

## 2017-12-12 DIAGNOSIS — E1122 Type 2 diabetes mellitus with diabetic chronic kidney disease: Secondary | ICD-10-CM | POA: Diagnosis not present

## 2017-12-12 DIAGNOSIS — E039 Hypothyroidism, unspecified: Secondary | ICD-10-CM | POA: Diagnosis not present

## 2017-12-12 DIAGNOSIS — R52 Pain, unspecified: Secondary | ICD-10-CM | POA: Diagnosis not present

## 2017-12-12 DIAGNOSIS — R197 Diarrhea, unspecified: Secondary | ICD-10-CM | POA: Diagnosis not present

## 2017-12-12 DIAGNOSIS — N186 End stage renal disease: Secondary | ICD-10-CM | POA: Diagnosis not present

## 2017-12-12 DIAGNOSIS — D509 Iron deficiency anemia, unspecified: Secondary | ICD-10-CM | POA: Diagnosis not present

## 2017-12-12 DIAGNOSIS — N2581 Secondary hyperparathyroidism of renal origin: Secondary | ICD-10-CM | POA: Diagnosis not present

## 2017-12-15 DIAGNOSIS — N186 End stage renal disease: Secondary | ICD-10-CM | POA: Diagnosis not present

## 2017-12-15 DIAGNOSIS — E1122 Type 2 diabetes mellitus with diabetic chronic kidney disease: Secondary | ICD-10-CM | POA: Diagnosis not present

## 2017-12-15 DIAGNOSIS — D689 Coagulation defect, unspecified: Secondary | ICD-10-CM | POA: Diagnosis not present

## 2017-12-15 DIAGNOSIS — D631 Anemia in chronic kidney disease: Secondary | ICD-10-CM | POA: Diagnosis not present

## 2017-12-15 DIAGNOSIS — R197 Diarrhea, unspecified: Secondary | ICD-10-CM | POA: Diagnosis not present

## 2017-12-15 DIAGNOSIS — N2581 Secondary hyperparathyroidism of renal origin: Secondary | ICD-10-CM | POA: Diagnosis not present

## 2017-12-15 DIAGNOSIS — E039 Hypothyroidism, unspecified: Secondary | ICD-10-CM | POA: Diagnosis not present

## 2017-12-15 DIAGNOSIS — D509 Iron deficiency anemia, unspecified: Secondary | ICD-10-CM | POA: Diagnosis not present

## 2017-12-15 DIAGNOSIS — R52 Pain, unspecified: Secondary | ICD-10-CM | POA: Diagnosis not present

## 2017-12-17 ENCOUNTER — Other Ambulatory Visit: Payer: Self-pay | Admitting: *Deleted

## 2017-12-17 DIAGNOSIS — E1122 Type 2 diabetes mellitus with diabetic chronic kidney disease: Secondary | ICD-10-CM | POA: Diagnosis not present

## 2017-12-17 DIAGNOSIS — D689 Coagulation defect, unspecified: Secondary | ICD-10-CM | POA: Diagnosis not present

## 2017-12-17 DIAGNOSIS — E039 Hypothyroidism, unspecified: Secondary | ICD-10-CM | POA: Diagnosis not present

## 2017-12-17 DIAGNOSIS — D631 Anemia in chronic kidney disease: Secondary | ICD-10-CM | POA: Diagnosis not present

## 2017-12-17 DIAGNOSIS — N2581 Secondary hyperparathyroidism of renal origin: Secondary | ICD-10-CM | POA: Diagnosis not present

## 2017-12-17 DIAGNOSIS — R52 Pain, unspecified: Secondary | ICD-10-CM | POA: Diagnosis not present

## 2017-12-17 DIAGNOSIS — N186 End stage renal disease: Secondary | ICD-10-CM | POA: Diagnosis not present

## 2017-12-17 DIAGNOSIS — I2510 Atherosclerotic heart disease of native coronary artery without angina pectoris: Secondary | ICD-10-CM

## 2017-12-17 DIAGNOSIS — D509 Iron deficiency anemia, unspecified: Secondary | ICD-10-CM | POA: Diagnosis not present

## 2017-12-17 DIAGNOSIS — R197 Diarrhea, unspecified: Secondary | ICD-10-CM | POA: Diagnosis not present

## 2017-12-17 MED ORDER — ATORVASTATIN CALCIUM 40 MG PO TABS: 40.0000 mg | ORAL_TABLET | Freq: Every day | ORAL | 2 refills | Status: DC

## 2017-12-19 DIAGNOSIS — R197 Diarrhea, unspecified: Secondary | ICD-10-CM | POA: Diagnosis not present

## 2017-12-19 DIAGNOSIS — D509 Iron deficiency anemia, unspecified: Secondary | ICD-10-CM | POA: Diagnosis not present

## 2017-12-19 DIAGNOSIS — E039 Hypothyroidism, unspecified: Secondary | ICD-10-CM | POA: Diagnosis not present

## 2017-12-19 DIAGNOSIS — R52 Pain, unspecified: Secondary | ICD-10-CM | POA: Diagnosis not present

## 2017-12-19 DIAGNOSIS — D631 Anemia in chronic kidney disease: Secondary | ICD-10-CM | POA: Diagnosis not present

## 2017-12-19 DIAGNOSIS — N2581 Secondary hyperparathyroidism of renal origin: Secondary | ICD-10-CM | POA: Diagnosis not present

## 2017-12-19 DIAGNOSIS — N186 End stage renal disease: Secondary | ICD-10-CM | POA: Diagnosis not present

## 2017-12-19 DIAGNOSIS — E1122 Type 2 diabetes mellitus with diabetic chronic kidney disease: Secondary | ICD-10-CM | POA: Diagnosis not present

## 2017-12-19 DIAGNOSIS — D689 Coagulation defect, unspecified: Secondary | ICD-10-CM | POA: Diagnosis not present

## 2017-12-22 DIAGNOSIS — D689 Coagulation defect, unspecified: Secondary | ICD-10-CM | POA: Diagnosis not present

## 2017-12-22 DIAGNOSIS — E1122 Type 2 diabetes mellitus with diabetic chronic kidney disease: Secondary | ICD-10-CM | POA: Diagnosis not present

## 2017-12-22 DIAGNOSIS — E039 Hypothyroidism, unspecified: Secondary | ICD-10-CM | POA: Diagnosis not present

## 2017-12-22 DIAGNOSIS — R197 Diarrhea, unspecified: Secondary | ICD-10-CM | POA: Diagnosis not present

## 2017-12-22 DIAGNOSIS — D509 Iron deficiency anemia, unspecified: Secondary | ICD-10-CM | POA: Diagnosis not present

## 2017-12-22 DIAGNOSIS — N2581 Secondary hyperparathyroidism of renal origin: Secondary | ICD-10-CM | POA: Diagnosis not present

## 2017-12-22 DIAGNOSIS — D631 Anemia in chronic kidney disease: Secondary | ICD-10-CM | POA: Diagnosis not present

## 2017-12-22 DIAGNOSIS — N186 End stage renal disease: Secondary | ICD-10-CM | POA: Diagnosis not present

## 2017-12-22 DIAGNOSIS — R52 Pain, unspecified: Secondary | ICD-10-CM | POA: Diagnosis not present

## 2017-12-24 DIAGNOSIS — N186 End stage renal disease: Secondary | ICD-10-CM | POA: Diagnosis not present

## 2017-12-24 DIAGNOSIS — D631 Anemia in chronic kidney disease: Secondary | ICD-10-CM | POA: Diagnosis not present

## 2017-12-24 DIAGNOSIS — D689 Coagulation defect, unspecified: Secondary | ICD-10-CM | POA: Diagnosis not present

## 2017-12-24 DIAGNOSIS — R197 Diarrhea, unspecified: Secondary | ICD-10-CM | POA: Diagnosis not present

## 2017-12-24 DIAGNOSIS — R52 Pain, unspecified: Secondary | ICD-10-CM | POA: Diagnosis not present

## 2017-12-24 DIAGNOSIS — E039 Hypothyroidism, unspecified: Secondary | ICD-10-CM | POA: Diagnosis not present

## 2017-12-24 DIAGNOSIS — D509 Iron deficiency anemia, unspecified: Secondary | ICD-10-CM | POA: Diagnosis not present

## 2017-12-24 DIAGNOSIS — E1122 Type 2 diabetes mellitus with diabetic chronic kidney disease: Secondary | ICD-10-CM | POA: Diagnosis not present

## 2017-12-24 DIAGNOSIS — N2581 Secondary hyperparathyroidism of renal origin: Secondary | ICD-10-CM | POA: Diagnosis not present

## 2017-12-26 DIAGNOSIS — N186 End stage renal disease: Secondary | ICD-10-CM | POA: Diagnosis not present

## 2017-12-26 DIAGNOSIS — D689 Coagulation defect, unspecified: Secondary | ICD-10-CM | POA: Diagnosis not present

## 2017-12-26 DIAGNOSIS — R197 Diarrhea, unspecified: Secondary | ICD-10-CM | POA: Diagnosis not present

## 2017-12-26 DIAGNOSIS — D631 Anemia in chronic kidney disease: Secondary | ICD-10-CM | POA: Diagnosis not present

## 2017-12-26 DIAGNOSIS — R52 Pain, unspecified: Secondary | ICD-10-CM | POA: Diagnosis not present

## 2017-12-26 DIAGNOSIS — D509 Iron deficiency anemia, unspecified: Secondary | ICD-10-CM | POA: Diagnosis not present

## 2017-12-26 DIAGNOSIS — E039 Hypothyroidism, unspecified: Secondary | ICD-10-CM | POA: Diagnosis not present

## 2017-12-26 DIAGNOSIS — E1122 Type 2 diabetes mellitus with diabetic chronic kidney disease: Secondary | ICD-10-CM | POA: Diagnosis not present

## 2017-12-26 DIAGNOSIS — N2581 Secondary hyperparathyroidism of renal origin: Secondary | ICD-10-CM | POA: Diagnosis not present

## 2017-12-29 DIAGNOSIS — E039 Hypothyroidism, unspecified: Secondary | ICD-10-CM | POA: Diagnosis not present

## 2017-12-29 DIAGNOSIS — R197 Diarrhea, unspecified: Secondary | ICD-10-CM | POA: Diagnosis not present

## 2017-12-29 DIAGNOSIS — N2581 Secondary hyperparathyroidism of renal origin: Secondary | ICD-10-CM | POA: Diagnosis not present

## 2017-12-29 DIAGNOSIS — D509 Iron deficiency anemia, unspecified: Secondary | ICD-10-CM | POA: Diagnosis not present

## 2017-12-29 DIAGNOSIS — N186 End stage renal disease: Secondary | ICD-10-CM | POA: Diagnosis not present

## 2017-12-29 DIAGNOSIS — R52 Pain, unspecified: Secondary | ICD-10-CM | POA: Diagnosis not present

## 2017-12-29 DIAGNOSIS — E1122 Type 2 diabetes mellitus with diabetic chronic kidney disease: Secondary | ICD-10-CM | POA: Diagnosis not present

## 2017-12-29 DIAGNOSIS — D689 Coagulation defect, unspecified: Secondary | ICD-10-CM | POA: Diagnosis not present

## 2017-12-29 DIAGNOSIS — Z992 Dependence on renal dialysis: Secondary | ICD-10-CM | POA: Diagnosis not present

## 2017-12-29 DIAGNOSIS — D631 Anemia in chronic kidney disease: Secondary | ICD-10-CM | POA: Diagnosis not present

## 2017-12-29 IMAGING — CT CT ABDOMEN WO/W CM
1 of 4 series · 9 of 32 positions shown, 15 images · IV contrast (omnipaque)
Comparison: 07/21/2014.

CLINICAL DATA: Followup renal mass

EXAM:
CT ABDOMEN WITHOUT AND WITH CONTRAST
TECHNIQUE: Multidetector CT imaging of the abdomen was performed following the
standard protocol before and following the bolus administration of
intravenous contrast.
CONTRAST:  100mL OMNIPAQUE IOHEXOL 350 MG/ML SOLN

[Series 2: axial nephro · axial · 0.78mm/px · z∈[-794,-554]mm · 9 of 100 slices shown, 15 images]
[im 10/100  soft-tissue]
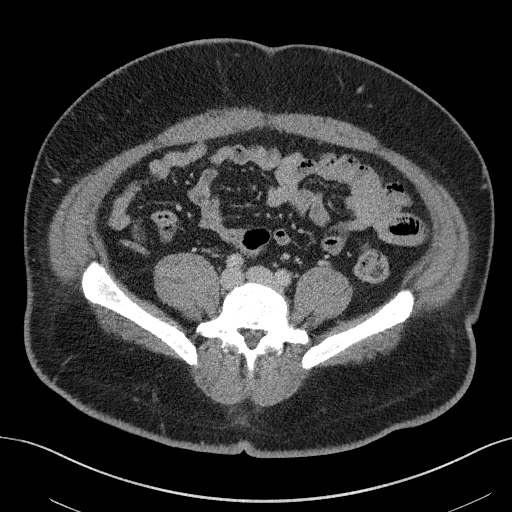
[im 10/100  bone]
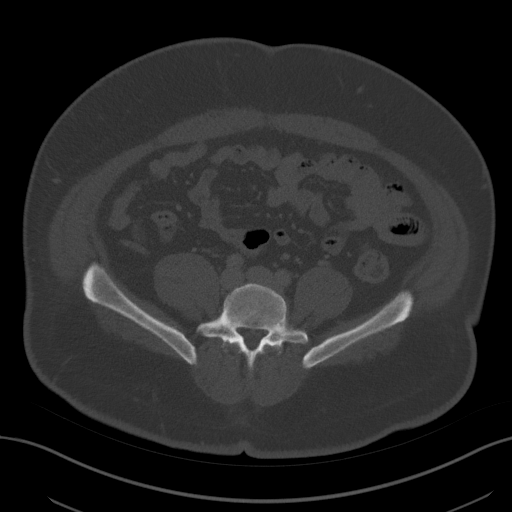
[im 20/100  soft-tissue]
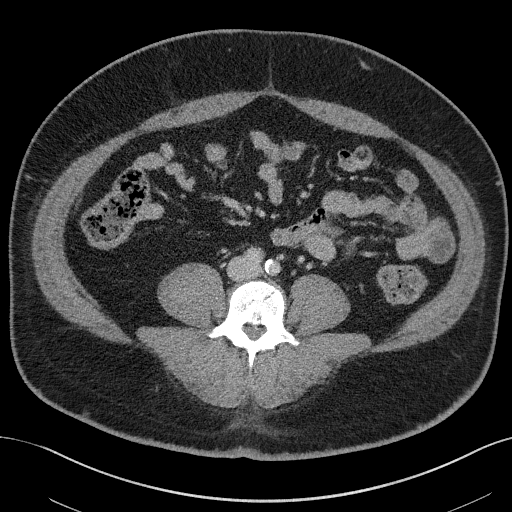
[im 30/100  soft-tissue]
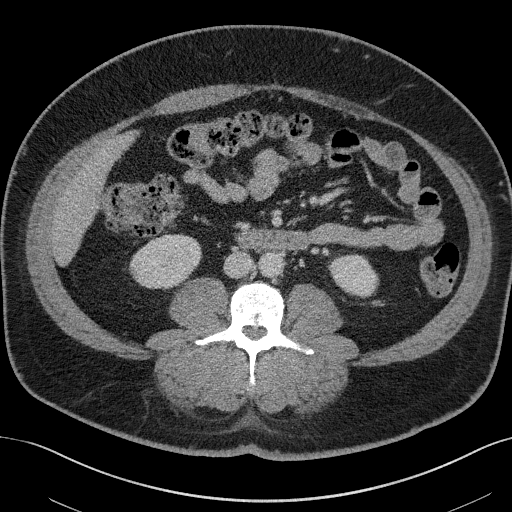
[im 40/100  soft-tissue]
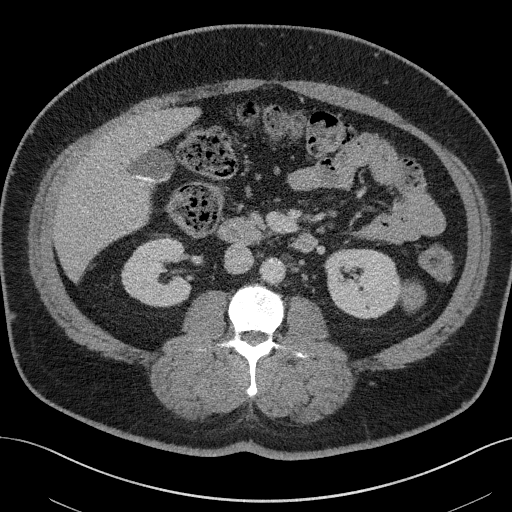
[im 50/100  soft-tissue]
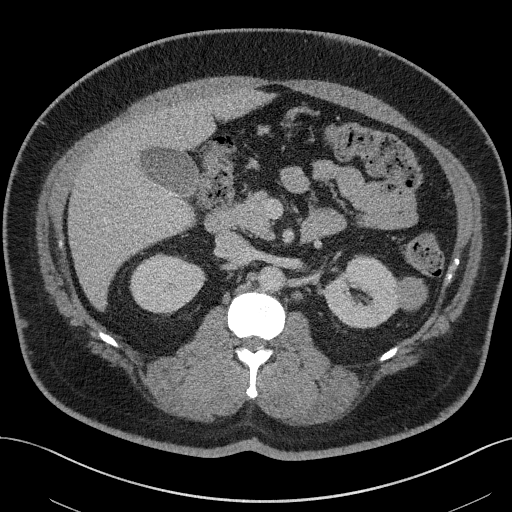
[im 60/100  soft-tissue]
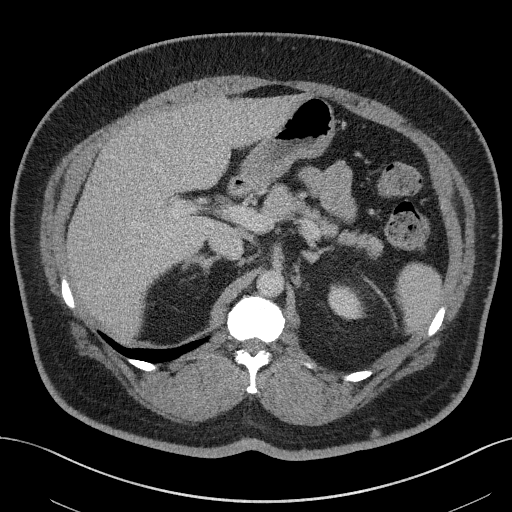
[im 60/100  lung]
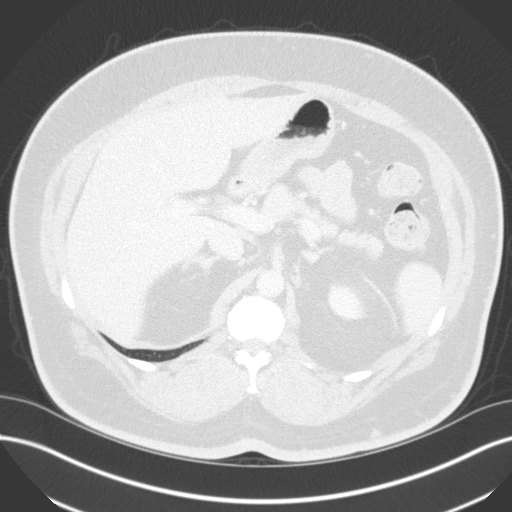
[im 70/100  soft-tissue]
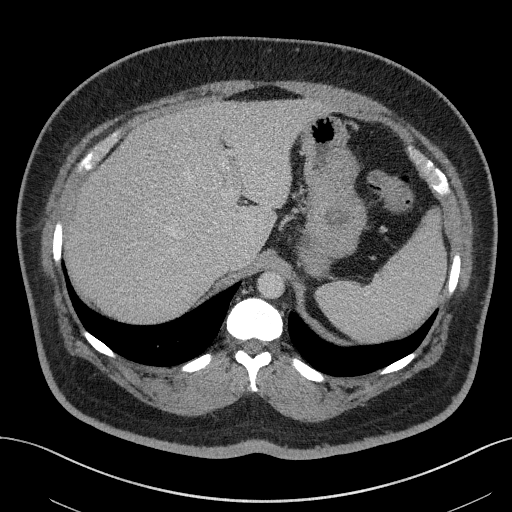
[im 70/100  lung]
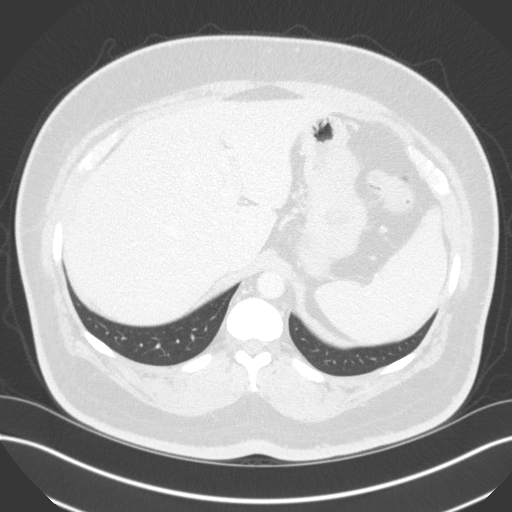
[im 80/100  soft-tissue]
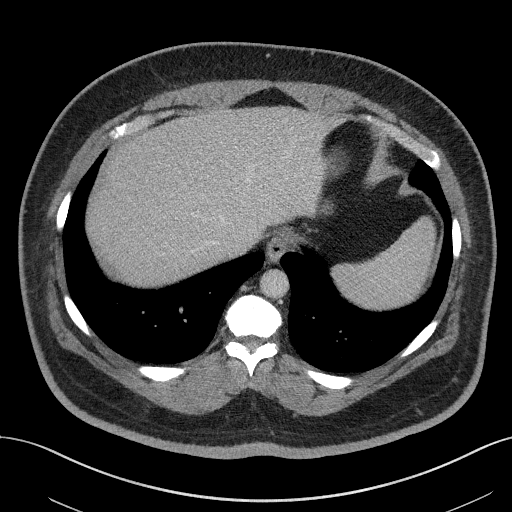
[im 80/100  lung]
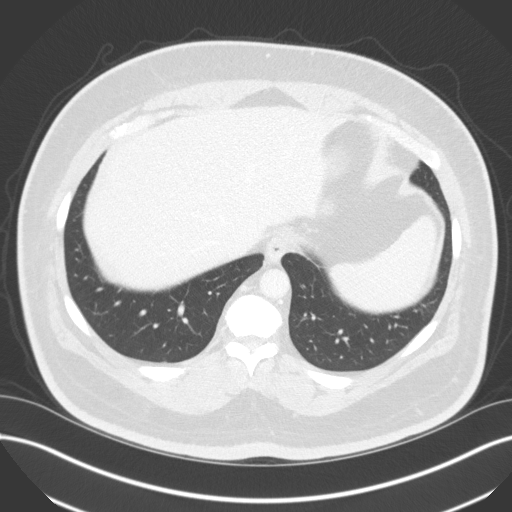
[im 90/100  soft-tissue]
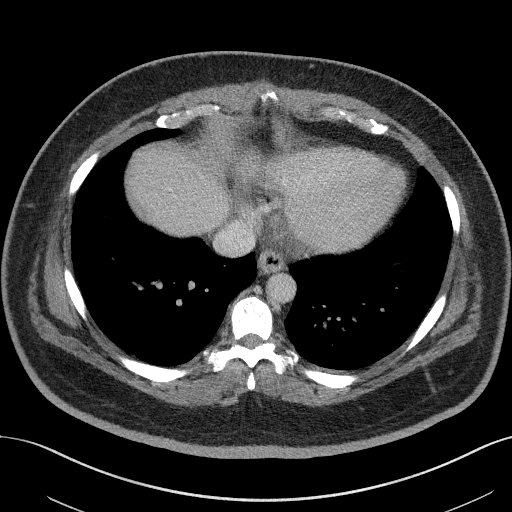
[im 90/100  lung]
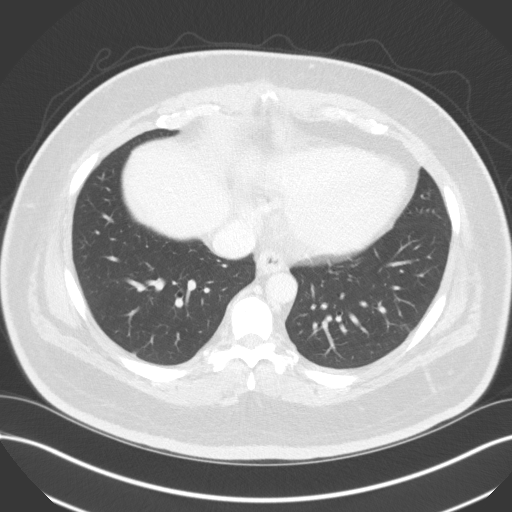
[im 90/100  bone]
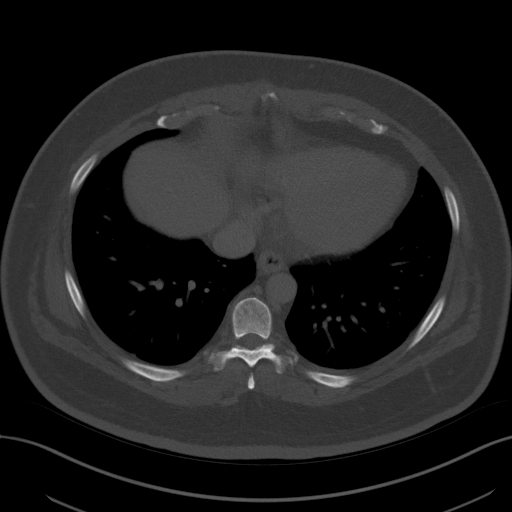

[9 of 32 positions shown; findings below may reference images not displayed]

FINDINGS: Lower chest: There is no pleural fluid identified. The lung bases
are clear.

Hepatobiliary: There is no suspicious liver abnormality identified.
Stones are noted within the dependent portion of the gallbladder. No
gallbladder wall thickening. No biliary dilatation.

Pancreas: Normal appearance of the pancreas.

Spleen: The spleen appears normal.

Adrenals/Urinary Tract: Normal appearance of the adrenal glands.
Solid enhancing mass arising from the lateral cortex of the left
kidney measures 4 x 3.8 x 3.9 cm, image 56 of series 5. On the
previous exam this measured 3.3 x 3.1 x 3.2 cm. No right kidney
lesion identified. The renal veins appear patent bilaterally.

Stomach/Bowel: The stomach is normal. The small bowel loops have a
normal course and caliber. The appendix is visualized and appears
normal. No pathologic dilatation of the large bowel loops.

Vascular/Lymphatic: Calcified atherosclerotic disease involves the
abdominal aorta. No aneurysm. Borderline periaortic lymph node
posterior to the left renal artery measures 9 mm, image number 50 of
series 5. Unchanged from previous exam. The other index periaortic
lymph node measures 8 mm, image 64 of series 5. Also stable.

Other: No free fluid or fluid collections identified within the
upper abdomen.

Musculoskeletal: No aggressive lytic or sclerotic bone lesions.
IMPRESSION: 1. There has been interval increase in size of the solid enhancing
mass arising from the a left kidney which is worrisome for a renal
cell carcinoma.
2. Stable sub cm periaortic lymph nodes
3. Gallstones
4. Aortic atherosclerosis.

## 2017-12-31 DIAGNOSIS — N186 End stage renal disease: Secondary | ICD-10-CM | POA: Diagnosis not present

## 2017-12-31 DIAGNOSIS — E039 Hypothyroidism, unspecified: Secondary | ICD-10-CM | POA: Diagnosis not present

## 2017-12-31 DIAGNOSIS — D689 Coagulation defect, unspecified: Secondary | ICD-10-CM | POA: Diagnosis not present

## 2017-12-31 DIAGNOSIS — E1122 Type 2 diabetes mellitus with diabetic chronic kidney disease: Secondary | ICD-10-CM | POA: Diagnosis not present

## 2017-12-31 DIAGNOSIS — D509 Iron deficiency anemia, unspecified: Secondary | ICD-10-CM | POA: Diagnosis not present

## 2017-12-31 DIAGNOSIS — D631 Anemia in chronic kidney disease: Secondary | ICD-10-CM | POA: Diagnosis not present

## 2017-12-31 DIAGNOSIS — N2581 Secondary hyperparathyroidism of renal origin: Secondary | ICD-10-CM | POA: Diagnosis not present

## 2018-01-02 DIAGNOSIS — D631 Anemia in chronic kidney disease: Secondary | ICD-10-CM | POA: Diagnosis not present

## 2018-01-02 DIAGNOSIS — E1122 Type 2 diabetes mellitus with diabetic chronic kidney disease: Secondary | ICD-10-CM | POA: Diagnosis not present

## 2018-01-02 DIAGNOSIS — D509 Iron deficiency anemia, unspecified: Secondary | ICD-10-CM | POA: Diagnosis not present

## 2018-01-02 DIAGNOSIS — N186 End stage renal disease: Secondary | ICD-10-CM | POA: Diagnosis not present

## 2018-01-02 DIAGNOSIS — E039 Hypothyroidism, unspecified: Secondary | ICD-10-CM | POA: Diagnosis not present

## 2018-01-02 DIAGNOSIS — D689 Coagulation defect, unspecified: Secondary | ICD-10-CM | POA: Diagnosis not present

## 2018-01-02 DIAGNOSIS — N2581 Secondary hyperparathyroidism of renal origin: Secondary | ICD-10-CM | POA: Diagnosis not present

## 2018-01-05 DIAGNOSIS — E039 Hypothyroidism, unspecified: Secondary | ICD-10-CM | POA: Diagnosis not present

## 2018-01-05 DIAGNOSIS — D631 Anemia in chronic kidney disease: Secondary | ICD-10-CM | POA: Diagnosis not present

## 2018-01-05 DIAGNOSIS — E1122 Type 2 diabetes mellitus with diabetic chronic kidney disease: Secondary | ICD-10-CM | POA: Diagnosis not present

## 2018-01-05 DIAGNOSIS — D689 Coagulation defect, unspecified: Secondary | ICD-10-CM | POA: Diagnosis not present

## 2018-01-05 DIAGNOSIS — N2581 Secondary hyperparathyroidism of renal origin: Secondary | ICD-10-CM | POA: Diagnosis not present

## 2018-01-05 DIAGNOSIS — N186 End stage renal disease: Secondary | ICD-10-CM | POA: Diagnosis not present

## 2018-01-05 DIAGNOSIS — D509 Iron deficiency anemia, unspecified: Secondary | ICD-10-CM | POA: Diagnosis not present

## 2018-01-07 DIAGNOSIS — E1122 Type 2 diabetes mellitus with diabetic chronic kidney disease: Secondary | ICD-10-CM | POA: Diagnosis not present

## 2018-01-07 DIAGNOSIS — E039 Hypothyroidism, unspecified: Secondary | ICD-10-CM | POA: Diagnosis not present

## 2018-01-07 DIAGNOSIS — D631 Anemia in chronic kidney disease: Secondary | ICD-10-CM | POA: Diagnosis not present

## 2018-01-07 DIAGNOSIS — N2581 Secondary hyperparathyroidism of renal origin: Secondary | ICD-10-CM | POA: Diagnosis not present

## 2018-01-07 DIAGNOSIS — D509 Iron deficiency anemia, unspecified: Secondary | ICD-10-CM | POA: Diagnosis not present

## 2018-01-07 DIAGNOSIS — D689 Coagulation defect, unspecified: Secondary | ICD-10-CM | POA: Diagnosis not present

## 2018-01-07 DIAGNOSIS — N186 End stage renal disease: Secondary | ICD-10-CM | POA: Diagnosis not present

## 2018-01-09 DIAGNOSIS — D689 Coagulation defect, unspecified: Secondary | ICD-10-CM | POA: Diagnosis not present

## 2018-01-09 DIAGNOSIS — N186 End stage renal disease: Secondary | ICD-10-CM | POA: Diagnosis not present

## 2018-01-09 DIAGNOSIS — E1122 Type 2 diabetes mellitus with diabetic chronic kidney disease: Secondary | ICD-10-CM | POA: Diagnosis not present

## 2018-01-09 DIAGNOSIS — D509 Iron deficiency anemia, unspecified: Secondary | ICD-10-CM | POA: Diagnosis not present

## 2018-01-09 DIAGNOSIS — D631 Anemia in chronic kidney disease: Secondary | ICD-10-CM | POA: Diagnosis not present

## 2018-01-09 DIAGNOSIS — N2581 Secondary hyperparathyroidism of renal origin: Secondary | ICD-10-CM | POA: Diagnosis not present

## 2018-01-09 DIAGNOSIS — E039 Hypothyroidism, unspecified: Secondary | ICD-10-CM | POA: Diagnosis not present

## 2018-01-12 DIAGNOSIS — N186 End stage renal disease: Secondary | ICD-10-CM | POA: Diagnosis not present

## 2018-01-12 DIAGNOSIS — E1122 Type 2 diabetes mellitus with diabetic chronic kidney disease: Secondary | ICD-10-CM | POA: Diagnosis not present

## 2018-01-12 DIAGNOSIS — D631 Anemia in chronic kidney disease: Secondary | ICD-10-CM | POA: Diagnosis not present

## 2018-01-12 DIAGNOSIS — N2581 Secondary hyperparathyroidism of renal origin: Secondary | ICD-10-CM | POA: Diagnosis not present

## 2018-01-12 DIAGNOSIS — D509 Iron deficiency anemia, unspecified: Secondary | ICD-10-CM | POA: Diagnosis not present

## 2018-01-12 DIAGNOSIS — E039 Hypothyroidism, unspecified: Secondary | ICD-10-CM | POA: Diagnosis not present

## 2018-01-12 DIAGNOSIS — D689 Coagulation defect, unspecified: Secondary | ICD-10-CM | POA: Diagnosis not present

## 2018-01-14 ENCOUNTER — Encounter: Payer: Self-pay | Admitting: Podiatry

## 2018-01-14 ENCOUNTER — Ambulatory Visit (INDEPENDENT_AMBULATORY_CARE_PROVIDER_SITE_OTHER): Payer: Medicare Other

## 2018-01-14 ENCOUNTER — Other Ambulatory Visit: Payer: Self-pay

## 2018-01-14 ENCOUNTER — Ambulatory Visit: Payer: Medicare Other | Admitting: Podiatry

## 2018-01-14 DIAGNOSIS — Z89511 Acquired absence of right leg below knee: Secondary | ICD-10-CM | POA: Diagnosis not present

## 2018-01-14 DIAGNOSIS — N186 End stage renal disease: Secondary | ICD-10-CM | POA: Diagnosis not present

## 2018-01-14 DIAGNOSIS — E039 Hypothyroidism, unspecified: Secondary | ICD-10-CM | POA: Diagnosis not present

## 2018-01-14 DIAGNOSIS — D509 Iron deficiency anemia, unspecified: Secondary | ICD-10-CM | POA: Diagnosis not present

## 2018-01-14 DIAGNOSIS — E0842 Diabetes mellitus due to underlying condition with diabetic polyneuropathy: Secondary | ICD-10-CM

## 2018-01-14 DIAGNOSIS — E11610 Type 2 diabetes mellitus with diabetic neuropathic arthropathy: Secondary | ICD-10-CM

## 2018-01-14 DIAGNOSIS — N2581 Secondary hyperparathyroidism of renal origin: Secondary | ICD-10-CM | POA: Diagnosis not present

## 2018-01-14 DIAGNOSIS — D631 Anemia in chronic kidney disease: Secondary | ICD-10-CM | POA: Diagnosis not present

## 2018-01-14 DIAGNOSIS — E1122 Type 2 diabetes mellitus with diabetic chronic kidney disease: Secondary | ICD-10-CM | POA: Diagnosis not present

## 2018-01-14 DIAGNOSIS — D689 Coagulation defect, unspecified: Secondary | ICD-10-CM | POA: Diagnosis not present

## 2018-01-19 DIAGNOSIS — D631 Anemia in chronic kidney disease: Secondary | ICD-10-CM | POA: Diagnosis not present

## 2018-01-19 DIAGNOSIS — E1122 Type 2 diabetes mellitus with diabetic chronic kidney disease: Secondary | ICD-10-CM | POA: Diagnosis not present

## 2018-01-19 DIAGNOSIS — D509 Iron deficiency anemia, unspecified: Secondary | ICD-10-CM | POA: Diagnosis not present

## 2018-01-19 DIAGNOSIS — N2581 Secondary hyperparathyroidism of renal origin: Secondary | ICD-10-CM | POA: Diagnosis not present

## 2018-01-19 DIAGNOSIS — E039 Hypothyroidism, unspecified: Secondary | ICD-10-CM | POA: Diagnosis not present

## 2018-01-19 DIAGNOSIS — D689 Coagulation defect, unspecified: Secondary | ICD-10-CM | POA: Diagnosis not present

## 2018-01-19 DIAGNOSIS — N186 End stage renal disease: Secondary | ICD-10-CM | POA: Diagnosis not present

## 2018-01-21 DIAGNOSIS — E039 Hypothyroidism, unspecified: Secondary | ICD-10-CM | POA: Diagnosis not present

## 2018-01-21 DIAGNOSIS — N186 End stage renal disease: Secondary | ICD-10-CM | POA: Diagnosis not present

## 2018-01-21 DIAGNOSIS — D509 Iron deficiency anemia, unspecified: Secondary | ICD-10-CM | POA: Diagnosis not present

## 2018-01-21 DIAGNOSIS — N2581 Secondary hyperparathyroidism of renal origin: Secondary | ICD-10-CM | POA: Diagnosis not present

## 2018-01-21 DIAGNOSIS — E1122 Type 2 diabetes mellitus with diabetic chronic kidney disease: Secondary | ICD-10-CM | POA: Diagnosis not present

## 2018-01-21 DIAGNOSIS — D631 Anemia in chronic kidney disease: Secondary | ICD-10-CM | POA: Diagnosis not present

## 2018-01-21 DIAGNOSIS — D689 Coagulation defect, unspecified: Secondary | ICD-10-CM | POA: Diagnosis not present

## 2018-01-23 ENCOUNTER — Telehealth: Payer: Self-pay | Admitting: Podiatry

## 2018-01-23 DIAGNOSIS — E039 Hypothyroidism, unspecified: Secondary | ICD-10-CM | POA: Diagnosis not present

## 2018-01-23 DIAGNOSIS — E1122 Type 2 diabetes mellitus with diabetic chronic kidney disease: Secondary | ICD-10-CM | POA: Diagnosis not present

## 2018-01-23 DIAGNOSIS — N2581 Secondary hyperparathyroidism of renal origin: Secondary | ICD-10-CM | POA: Diagnosis not present

## 2018-01-23 DIAGNOSIS — D631 Anemia in chronic kidney disease: Secondary | ICD-10-CM | POA: Diagnosis not present

## 2018-01-23 DIAGNOSIS — D509 Iron deficiency anemia, unspecified: Secondary | ICD-10-CM | POA: Diagnosis not present

## 2018-01-23 DIAGNOSIS — D689 Coagulation defect, unspecified: Secondary | ICD-10-CM | POA: Diagnosis not present

## 2018-01-23 DIAGNOSIS — N186 End stage renal disease: Secondary | ICD-10-CM | POA: Diagnosis not present

## 2018-01-23 NOTE — Telephone Encounter (Signed)
This is [employer], dispensing from [employer]. We faxed over papers for Dr. Amalia Hailey to sign on this patient back on 18 October. However, we have not received them back yet but we have gotten all the others back. I know you only do these on Friday. If you could please get that signed and faxed back to [place] at [phone]. If you need to speak to [place], the phone number is [phone].

## 2018-01-23 NOTE — Telephone Encounter (Signed)
Faxed message informing Sharyn Lull, Dr. Amalia Hailey had the forms and would complete, and I would fax back to them.

## 2018-01-25 NOTE — Progress Notes (Signed)
   HPI:  52 year old male with a history of diabetes mellitus and right below-knee amputation presents to the office today for follow-up evaluation regarding Charcot arthropathy to the left forefoot. He states he is doing well at this time and denies any significant pain or modifying factors. He has no new complaints. Patient is here for further evaluation and treatment.   Past Medical History:  Diagnosis Date  . Anemia   . Arthritis   . Cancer [facility])    Renal Tumor  . CKD (chronic kidney disease)   . Coronary artery disease   . Diabetes ([place])   . DM (diabetes mellitus) with complications ([place])   . ESRD (end stage renal disease) ([place])    Pre- dialysis  . Hypertension   . Left kidney mass   . Neuropathy   . Obesity   . Osteomyelitis, chronic, ankle or foot ([place])   . PONV (postoperative nausea and vomiting)   . Renal insufficiency    Patient is on Dialysis and receives M,W and F.  . S/P BKA (below knee amputation) [facility]) June 2016   Right , [place], [place]  . Sleep apnea    CPAP  . Thyroid disease       Physical Exam: General: The patient is alert and oriented x3 in no acute distress.  Dermatology: Skin is warm, dry and supple left lower extremities. Negative for open lesions or macerations.  Vascular: There is moderate edema noted to the left lower extremity localized to the digits 2-5 of the right foot..  Neurological: Epicritic and protective threshold grossly intact bilaterally.   Musculoskeletal Exam: Evidence of partial first ray amputation the left foot which appears to be healed uneventfully. Mild hammertoe contracture digits 2-5 of the left foot also noted.  Radiographic Exam:  Osseous degeneration appears stable at the MTPJs of the left foot.   Assessment: 1. Status post partial first ray amputation left foot 2. Below-knee amputation right lower extremity 3. Charcot arthropathy left forefoot - resolved    Plan of Care:  1. Patient was evaluated. X-Rays  reviewed.  2. Prescription for new below the knee prosthetic socket/sleeve provided to patient to take to [facility].  3. Return to clinic as needed.    Edrick Kins, DPM [facility]  Dr. Edrick Kins, DPM    [address], [place] [address]                Office [phone]  Fax [phone]

## 2018-01-26 DIAGNOSIS — E1122 Type 2 diabetes mellitus with diabetic chronic kidney disease: Secondary | ICD-10-CM | POA: Diagnosis not present

## 2018-01-26 DIAGNOSIS — E039 Hypothyroidism, unspecified: Secondary | ICD-10-CM | POA: Diagnosis not present

## 2018-01-26 DIAGNOSIS — D631 Anemia in chronic kidney disease: Secondary | ICD-10-CM | POA: Diagnosis not present

## 2018-01-26 DIAGNOSIS — N2581 Secondary hyperparathyroidism of renal origin: Secondary | ICD-10-CM | POA: Diagnosis not present

## 2018-01-26 DIAGNOSIS — D689 Coagulation defect, unspecified: Secondary | ICD-10-CM | POA: Diagnosis not present

## 2018-01-26 DIAGNOSIS — D509 Iron deficiency anemia, unspecified: Secondary | ICD-10-CM | POA: Diagnosis not present

## 2018-01-26 DIAGNOSIS — N186 End stage renal disease: Secondary | ICD-10-CM | POA: Diagnosis not present

## 2018-01-28 DIAGNOSIS — D509 Iron deficiency anemia, unspecified: Secondary | ICD-10-CM | POA: Diagnosis not present

## 2018-01-28 DIAGNOSIS — E039 Hypothyroidism, unspecified: Secondary | ICD-10-CM | POA: Diagnosis not present

## 2018-01-28 DIAGNOSIS — E1122 Type 2 diabetes mellitus with diabetic chronic kidney disease: Secondary | ICD-10-CM | POA: Diagnosis not present

## 2018-01-28 DIAGNOSIS — D631 Anemia in chronic kidney disease: Secondary | ICD-10-CM | POA: Diagnosis not present

## 2018-01-28 DIAGNOSIS — N186 End stage renal disease: Secondary | ICD-10-CM | POA: Diagnosis not present

## 2018-01-28 DIAGNOSIS — D689 Coagulation defect, unspecified: Secondary | ICD-10-CM | POA: Diagnosis not present

## 2018-01-28 DIAGNOSIS — N2581 Secondary hyperparathyroidism of renal origin: Secondary | ICD-10-CM | POA: Diagnosis not present

## 2018-01-29 DIAGNOSIS — Z992 Dependence on renal dialysis: Secondary | ICD-10-CM | POA: Diagnosis not present

## 2018-01-29 DIAGNOSIS — E1122 Type 2 diabetes mellitus with diabetic chronic kidney disease: Secondary | ICD-10-CM | POA: Diagnosis not present

## 2018-01-29 DIAGNOSIS — N186 End stage renal disease: Secondary | ICD-10-CM | POA: Diagnosis not present

## 2018-01-30 DIAGNOSIS — Z992 Dependence on renal dialysis: Secondary | ICD-10-CM | POA: Diagnosis not present

## 2018-01-30 DIAGNOSIS — D509 Iron deficiency anemia, unspecified: Secondary | ICD-10-CM | POA: Diagnosis not present

## 2018-01-30 DIAGNOSIS — D689 Coagulation defect, unspecified: Secondary | ICD-10-CM | POA: Diagnosis not present

## 2018-01-30 DIAGNOSIS — N2581 Secondary hyperparathyroidism of renal origin: Secondary | ICD-10-CM | POA: Diagnosis not present

## 2018-01-30 DIAGNOSIS — N186 End stage renal disease: Secondary | ICD-10-CM | POA: Diagnosis not present

## 2018-01-30 DIAGNOSIS — E039 Hypothyroidism, unspecified: Secondary | ICD-10-CM | POA: Diagnosis not present

## 2018-01-30 DIAGNOSIS — D631 Anemia in chronic kidney disease: Secondary | ICD-10-CM | POA: Diagnosis not present

## 2018-01-30 DIAGNOSIS — E1122 Type 2 diabetes mellitus with diabetic chronic kidney disease: Secondary | ICD-10-CM | POA: Diagnosis not present

## 2018-02-02 DIAGNOSIS — E1122 Type 2 diabetes mellitus with diabetic chronic kidney disease: Secondary | ICD-10-CM | POA: Diagnosis not present

## 2018-02-02 DIAGNOSIS — Z992 Dependence on renal dialysis: Secondary | ICD-10-CM | POA: Diagnosis not present

## 2018-02-02 DIAGNOSIS — E039 Hypothyroidism, unspecified: Secondary | ICD-10-CM | POA: Diagnosis not present

## 2018-02-02 DIAGNOSIS — D509 Iron deficiency anemia, unspecified: Secondary | ICD-10-CM | POA: Diagnosis not present

## 2018-02-02 DIAGNOSIS — D631 Anemia in chronic kidney disease: Secondary | ICD-10-CM | POA: Diagnosis not present

## 2018-02-02 DIAGNOSIS — N2581 Secondary hyperparathyroidism of renal origin: Secondary | ICD-10-CM | POA: Diagnosis not present

## 2018-02-02 DIAGNOSIS — D689 Coagulation defect, unspecified: Secondary | ICD-10-CM | POA: Diagnosis not present

## 2018-02-02 DIAGNOSIS — N186 End stage renal disease: Secondary | ICD-10-CM | POA: Diagnosis not present

## 2018-02-04 DIAGNOSIS — N2581 Secondary hyperparathyroidism of renal origin: Secondary | ICD-10-CM | POA: Diagnosis not present

## 2018-02-04 DIAGNOSIS — D631 Anemia in chronic kidney disease: Secondary | ICD-10-CM | POA: Diagnosis not present

## 2018-02-04 DIAGNOSIS — D689 Coagulation defect, unspecified: Secondary | ICD-10-CM | POA: Diagnosis not present

## 2018-02-04 DIAGNOSIS — N186 End stage renal disease: Secondary | ICD-10-CM | POA: Diagnosis not present

## 2018-02-04 DIAGNOSIS — D509 Iron deficiency anemia, unspecified: Secondary | ICD-10-CM | POA: Diagnosis not present

## 2018-02-04 DIAGNOSIS — E1122 Type 2 diabetes mellitus with diabetic chronic kidney disease: Secondary | ICD-10-CM | POA: Diagnosis not present

## 2018-02-04 DIAGNOSIS — E039 Hypothyroidism, unspecified: Secondary | ICD-10-CM | POA: Diagnosis not present

## 2018-02-04 DIAGNOSIS — Z89511 Acquired absence of right leg below knee: Secondary | ICD-10-CM | POA: Diagnosis not present

## 2018-02-04 DIAGNOSIS — Z992 Dependence on renal dialysis: Secondary | ICD-10-CM | POA: Diagnosis not present

## 2018-02-06 DIAGNOSIS — D689 Coagulation defect, unspecified: Secondary | ICD-10-CM | POA: Diagnosis not present

## 2018-02-06 DIAGNOSIS — N2581 Secondary hyperparathyroidism of renal origin: Secondary | ICD-10-CM | POA: Diagnosis not present

## 2018-02-06 DIAGNOSIS — E1122 Type 2 diabetes mellitus with diabetic chronic kidney disease: Secondary | ICD-10-CM | POA: Diagnosis not present

## 2018-02-06 DIAGNOSIS — N186 End stage renal disease: Secondary | ICD-10-CM | POA: Diagnosis not present

## 2018-02-06 DIAGNOSIS — E039 Hypothyroidism, unspecified: Secondary | ICD-10-CM | POA: Diagnosis not present

## 2018-02-06 DIAGNOSIS — Z992 Dependence on renal dialysis: Secondary | ICD-10-CM | POA: Diagnosis not present

## 2018-02-06 DIAGNOSIS — D631 Anemia in chronic kidney disease: Secondary | ICD-10-CM | POA: Diagnosis not present

## 2018-02-06 DIAGNOSIS — D509 Iron deficiency anemia, unspecified: Secondary | ICD-10-CM | POA: Diagnosis not present

## 2018-02-09 DIAGNOSIS — N2581 Secondary hyperparathyroidism of renal origin: Secondary | ICD-10-CM | POA: Diagnosis not present

## 2018-02-09 DIAGNOSIS — D631 Anemia in chronic kidney disease: Secondary | ICD-10-CM | POA: Diagnosis not present

## 2018-02-09 DIAGNOSIS — E1122 Type 2 diabetes mellitus with diabetic chronic kidney disease: Secondary | ICD-10-CM | POA: Diagnosis not present

## 2018-02-09 DIAGNOSIS — N186 End stage renal disease: Secondary | ICD-10-CM | POA: Diagnosis not present

## 2018-02-09 DIAGNOSIS — Z992 Dependence on renal dialysis: Secondary | ICD-10-CM | POA: Diagnosis not present

## 2018-02-09 DIAGNOSIS — D509 Iron deficiency anemia, unspecified: Secondary | ICD-10-CM | POA: Diagnosis not present

## 2018-02-09 DIAGNOSIS — D689 Coagulation defect, unspecified: Secondary | ICD-10-CM | POA: Diagnosis not present

## 2018-02-09 DIAGNOSIS — E039 Hypothyroidism, unspecified: Secondary | ICD-10-CM | POA: Diagnosis not present

## 2018-02-11 DIAGNOSIS — E039 Hypothyroidism, unspecified: Secondary | ICD-10-CM | POA: Diagnosis not present

## 2018-02-11 DIAGNOSIS — D631 Anemia in chronic kidney disease: Secondary | ICD-10-CM | POA: Diagnosis not present

## 2018-02-11 DIAGNOSIS — D509 Iron deficiency anemia, unspecified: Secondary | ICD-10-CM | POA: Diagnosis not present

## 2018-02-11 DIAGNOSIS — N186 End stage renal disease: Secondary | ICD-10-CM | POA: Diagnosis not present

## 2018-02-11 DIAGNOSIS — Z992 Dependence on renal dialysis: Secondary | ICD-10-CM | POA: Diagnosis not present

## 2018-02-11 DIAGNOSIS — E1122 Type 2 diabetes mellitus with diabetic chronic kidney disease: Secondary | ICD-10-CM | POA: Diagnosis not present

## 2018-02-11 DIAGNOSIS — N2581 Secondary hyperparathyroidism of renal origin: Secondary | ICD-10-CM | POA: Diagnosis not present

## 2018-02-11 DIAGNOSIS — D689 Coagulation defect, unspecified: Secondary | ICD-10-CM | POA: Diagnosis not present

## 2018-02-13 DIAGNOSIS — E1122 Type 2 diabetes mellitus with diabetic chronic kidney disease: Secondary | ICD-10-CM | POA: Diagnosis not present

## 2018-02-13 DIAGNOSIS — D631 Anemia in chronic kidney disease: Secondary | ICD-10-CM | POA: Diagnosis not present

## 2018-02-13 DIAGNOSIS — D509 Iron deficiency anemia, unspecified: Secondary | ICD-10-CM | POA: Diagnosis not present

## 2018-02-13 DIAGNOSIS — Z992 Dependence on renal dialysis: Secondary | ICD-10-CM | POA: Diagnosis not present

## 2018-02-13 DIAGNOSIS — D689 Coagulation defect, unspecified: Secondary | ICD-10-CM | POA: Diagnosis not present

## 2018-02-13 DIAGNOSIS — N2581 Secondary hyperparathyroidism of renal origin: Secondary | ICD-10-CM | POA: Diagnosis not present

## 2018-02-13 DIAGNOSIS — N186 End stage renal disease: Secondary | ICD-10-CM | POA: Diagnosis not present

## 2018-02-13 DIAGNOSIS — E039 Hypothyroidism, unspecified: Secondary | ICD-10-CM | POA: Diagnosis not present

## 2018-02-16 DIAGNOSIS — D509 Iron deficiency anemia, unspecified: Secondary | ICD-10-CM | POA: Diagnosis not present

## 2018-02-16 DIAGNOSIS — D631 Anemia in chronic kidney disease: Secondary | ICD-10-CM | POA: Diagnosis not present

## 2018-02-16 DIAGNOSIS — D689 Coagulation defect, unspecified: Secondary | ICD-10-CM | POA: Diagnosis not present

## 2018-02-16 DIAGNOSIS — Z992 Dependence on renal dialysis: Secondary | ICD-10-CM | POA: Diagnosis not present

## 2018-02-16 DIAGNOSIS — E1122 Type 2 diabetes mellitus with diabetic chronic kidney disease: Secondary | ICD-10-CM | POA: Diagnosis not present

## 2018-02-16 DIAGNOSIS — N2581 Secondary hyperparathyroidism of renal origin: Secondary | ICD-10-CM | POA: Diagnosis not present

## 2018-02-16 DIAGNOSIS — N186 End stage renal disease: Secondary | ICD-10-CM | POA: Diagnosis not present

## 2018-02-16 DIAGNOSIS — E039 Hypothyroidism, unspecified: Secondary | ICD-10-CM | POA: Diagnosis not present

## 2018-02-18 DIAGNOSIS — D689 Coagulation defect, unspecified: Secondary | ICD-10-CM | POA: Diagnosis not present

## 2018-02-18 DIAGNOSIS — E1122 Type 2 diabetes mellitus with diabetic chronic kidney disease: Secondary | ICD-10-CM | POA: Diagnosis not present

## 2018-02-18 DIAGNOSIS — N2581 Secondary hyperparathyroidism of renal origin: Secondary | ICD-10-CM | POA: Diagnosis not present

## 2018-02-18 DIAGNOSIS — N186 End stage renal disease: Secondary | ICD-10-CM | POA: Diagnosis not present

## 2018-02-18 DIAGNOSIS — E039 Hypothyroidism, unspecified: Secondary | ICD-10-CM | POA: Diagnosis not present

## 2018-02-18 DIAGNOSIS — D509 Iron deficiency anemia, unspecified: Secondary | ICD-10-CM | POA: Diagnosis not present

## 2018-02-18 DIAGNOSIS — D631 Anemia in chronic kidney disease: Secondary | ICD-10-CM | POA: Diagnosis not present

## 2018-02-18 DIAGNOSIS — Z992 Dependence on renal dialysis: Secondary | ICD-10-CM | POA: Diagnosis not present

## 2018-02-20 DIAGNOSIS — D631 Anemia in chronic kidney disease: Secondary | ICD-10-CM | POA: Diagnosis not present

## 2018-02-20 DIAGNOSIS — E1122 Type 2 diabetes mellitus with diabetic chronic kidney disease: Secondary | ICD-10-CM | POA: Diagnosis not present

## 2018-02-20 DIAGNOSIS — N2581 Secondary hyperparathyroidism of renal origin: Secondary | ICD-10-CM | POA: Diagnosis not present

## 2018-02-20 DIAGNOSIS — N186 End stage renal disease: Secondary | ICD-10-CM | POA: Diagnosis not present

## 2018-02-20 DIAGNOSIS — E039 Hypothyroidism, unspecified: Secondary | ICD-10-CM | POA: Diagnosis not present

## 2018-02-20 DIAGNOSIS — Z992 Dependence on renal dialysis: Secondary | ICD-10-CM | POA: Diagnosis not present

## 2018-02-20 DIAGNOSIS — D689 Coagulation defect, unspecified: Secondary | ICD-10-CM | POA: Diagnosis not present

## 2018-02-20 DIAGNOSIS — D509 Iron deficiency anemia, unspecified: Secondary | ICD-10-CM | POA: Diagnosis not present

## 2018-02-22 DIAGNOSIS — N186 End stage renal disease: Secondary | ICD-10-CM | POA: Diagnosis not present

## 2018-02-22 DIAGNOSIS — D689 Coagulation defect, unspecified: Secondary | ICD-10-CM | POA: Diagnosis not present

## 2018-02-22 DIAGNOSIS — E039 Hypothyroidism, unspecified: Secondary | ICD-10-CM | POA: Diagnosis not present

## 2018-02-22 DIAGNOSIS — E1122 Type 2 diabetes mellitus with diabetic chronic kidney disease: Secondary | ICD-10-CM | POA: Diagnosis not present

## 2018-02-22 DIAGNOSIS — D509 Iron deficiency anemia, unspecified: Secondary | ICD-10-CM | POA: Diagnosis not present

## 2018-02-22 DIAGNOSIS — N2581 Secondary hyperparathyroidism of renal origin: Secondary | ICD-10-CM | POA: Diagnosis not present

## 2018-02-22 DIAGNOSIS — Z992 Dependence on renal dialysis: Secondary | ICD-10-CM | POA: Diagnosis not present

## 2018-02-22 DIAGNOSIS — D631 Anemia in chronic kidney disease: Secondary | ICD-10-CM | POA: Diagnosis not present

## 2018-02-24 DIAGNOSIS — D631 Anemia in chronic kidney disease: Secondary | ICD-10-CM | POA: Diagnosis not present

## 2018-02-24 DIAGNOSIS — E1143 Type 2 diabetes mellitus with diabetic autonomic (poly)neuropathy: Secondary | ICD-10-CM | POA: Diagnosis not present

## 2018-02-24 DIAGNOSIS — D689 Coagulation defect, unspecified: Secondary | ICD-10-CM | POA: Diagnosis not present

## 2018-02-24 DIAGNOSIS — E039 Hypothyroidism, unspecified: Secondary | ICD-10-CM | POA: Diagnosis not present

## 2018-02-24 DIAGNOSIS — E1122 Type 2 diabetes mellitus with diabetic chronic kidney disease: Secondary | ICD-10-CM | POA: Diagnosis not present

## 2018-02-24 DIAGNOSIS — Z992 Dependence on renal dialysis: Secondary | ICD-10-CM | POA: Diagnosis not present

## 2018-02-24 DIAGNOSIS — D509 Iron deficiency anemia, unspecified: Secondary | ICD-10-CM | POA: Diagnosis not present

## 2018-02-24 DIAGNOSIS — N2581 Secondary hyperparathyroidism of renal origin: Secondary | ICD-10-CM | POA: Diagnosis not present

## 2018-02-24 DIAGNOSIS — N186 End stage renal disease: Secondary | ICD-10-CM | POA: Diagnosis not present

## 2018-02-27 DIAGNOSIS — D631 Anemia in chronic kidney disease: Secondary | ICD-10-CM | POA: Diagnosis not present

## 2018-02-27 DIAGNOSIS — D689 Coagulation defect, unspecified: Secondary | ICD-10-CM | POA: Diagnosis not present

## 2018-02-27 DIAGNOSIS — N186 End stage renal disease: Secondary | ICD-10-CM | POA: Diagnosis not present

## 2018-02-27 DIAGNOSIS — E039 Hypothyroidism, unspecified: Secondary | ICD-10-CM | POA: Diagnosis not present

## 2018-02-27 DIAGNOSIS — N2581 Secondary hyperparathyroidism of renal origin: Secondary | ICD-10-CM | POA: Diagnosis not present

## 2018-02-27 DIAGNOSIS — Z992 Dependence on renal dialysis: Secondary | ICD-10-CM | POA: Diagnosis not present

## 2018-02-27 DIAGNOSIS — D509 Iron deficiency anemia, unspecified: Secondary | ICD-10-CM | POA: Diagnosis not present

## 2018-02-27 DIAGNOSIS — E1122 Type 2 diabetes mellitus with diabetic chronic kidney disease: Secondary | ICD-10-CM | POA: Diagnosis not present

## 2018-02-28 DIAGNOSIS — N186 End stage renal disease: Secondary | ICD-10-CM | POA: Diagnosis not present

## 2018-02-28 DIAGNOSIS — Z992 Dependence on renal dialysis: Secondary | ICD-10-CM | POA: Diagnosis not present

## 2018-02-28 DIAGNOSIS — E1122 Type 2 diabetes mellitus with diabetic chronic kidney disease: Secondary | ICD-10-CM | POA: Diagnosis not present

## 2018-03-02 DIAGNOSIS — D689 Coagulation defect, unspecified: Secondary | ICD-10-CM | POA: Diagnosis not present

## 2018-03-02 DIAGNOSIS — D631 Anemia in chronic kidney disease: Secondary | ICD-10-CM | POA: Diagnosis not present

## 2018-03-02 DIAGNOSIS — N2581 Secondary hyperparathyroidism of renal origin: Secondary | ICD-10-CM | POA: Diagnosis not present

## 2018-03-02 DIAGNOSIS — D509 Iron deficiency anemia, unspecified: Secondary | ICD-10-CM | POA: Diagnosis not present

## 2018-03-02 DIAGNOSIS — N186 End stage renal disease: Secondary | ICD-10-CM | POA: Diagnosis not present

## 2018-03-02 DIAGNOSIS — E039 Hypothyroidism, unspecified: Secondary | ICD-10-CM | POA: Diagnosis not present

## 2018-03-02 DIAGNOSIS — E1122 Type 2 diabetes mellitus with diabetic chronic kidney disease: Secondary | ICD-10-CM | POA: Diagnosis not present

## 2018-03-04 DIAGNOSIS — E1122 Type 2 diabetes mellitus with diabetic chronic kidney disease: Secondary | ICD-10-CM | POA: Diagnosis not present

## 2018-03-04 DIAGNOSIS — N2581 Secondary hyperparathyroidism of renal origin: Secondary | ICD-10-CM | POA: Diagnosis not present

## 2018-03-04 DIAGNOSIS — N186 End stage renal disease: Secondary | ICD-10-CM | POA: Diagnosis not present

## 2018-03-04 DIAGNOSIS — D509 Iron deficiency anemia, unspecified: Secondary | ICD-10-CM | POA: Diagnosis not present

## 2018-03-04 DIAGNOSIS — D689 Coagulation defect, unspecified: Secondary | ICD-10-CM | POA: Diagnosis not present

## 2018-03-04 DIAGNOSIS — D631 Anemia in chronic kidney disease: Secondary | ICD-10-CM | POA: Diagnosis not present

## 2018-03-04 DIAGNOSIS — E039 Hypothyroidism, unspecified: Secondary | ICD-10-CM | POA: Diagnosis not present

## 2018-03-06 DIAGNOSIS — E1122 Type 2 diabetes mellitus with diabetic chronic kidney disease: Secondary | ICD-10-CM | POA: Diagnosis not present

## 2018-03-06 DIAGNOSIS — D631 Anemia in chronic kidney disease: Secondary | ICD-10-CM | POA: Diagnosis not present

## 2018-03-06 DIAGNOSIS — N2581 Secondary hyperparathyroidism of renal origin: Secondary | ICD-10-CM | POA: Diagnosis not present

## 2018-03-06 DIAGNOSIS — E039 Hypothyroidism, unspecified: Secondary | ICD-10-CM | POA: Diagnosis not present

## 2018-03-06 DIAGNOSIS — D509 Iron deficiency anemia, unspecified: Secondary | ICD-10-CM | POA: Diagnosis not present

## 2018-03-06 DIAGNOSIS — D689 Coagulation defect, unspecified: Secondary | ICD-10-CM | POA: Diagnosis not present

## 2018-03-06 DIAGNOSIS — N186 End stage renal disease: Secondary | ICD-10-CM | POA: Diagnosis not present

## 2018-03-09 DIAGNOSIS — N2581 Secondary hyperparathyroidism of renal origin: Secondary | ICD-10-CM | POA: Diagnosis not present

## 2018-03-09 DIAGNOSIS — E039 Hypothyroidism, unspecified: Secondary | ICD-10-CM | POA: Diagnosis not present

## 2018-03-09 DIAGNOSIS — D631 Anemia in chronic kidney disease: Secondary | ICD-10-CM | POA: Diagnosis not present

## 2018-03-09 DIAGNOSIS — D689 Coagulation defect, unspecified: Secondary | ICD-10-CM | POA: Diagnosis not present

## 2018-03-09 DIAGNOSIS — N186 End stage renal disease: Secondary | ICD-10-CM | POA: Diagnosis not present

## 2018-03-09 DIAGNOSIS — E1122 Type 2 diabetes mellitus with diabetic chronic kidney disease: Secondary | ICD-10-CM | POA: Diagnosis not present

## 2018-03-09 DIAGNOSIS — D509 Iron deficiency anemia, unspecified: Secondary | ICD-10-CM | POA: Diagnosis not present

## 2018-03-11 DIAGNOSIS — D631 Anemia in chronic kidney disease: Secondary | ICD-10-CM | POA: Diagnosis not present

## 2018-03-11 DIAGNOSIS — D509 Iron deficiency anemia, unspecified: Secondary | ICD-10-CM | POA: Diagnosis not present

## 2018-03-11 DIAGNOSIS — N2581 Secondary hyperparathyroidism of renal origin: Secondary | ICD-10-CM | POA: Diagnosis not present

## 2018-03-11 DIAGNOSIS — N186 End stage renal disease: Secondary | ICD-10-CM | POA: Diagnosis not present

## 2018-03-11 DIAGNOSIS — E039 Hypothyroidism, unspecified: Secondary | ICD-10-CM | POA: Diagnosis not present

## 2018-03-11 DIAGNOSIS — D689 Coagulation defect, unspecified: Secondary | ICD-10-CM | POA: Diagnosis not present

## 2018-03-11 DIAGNOSIS — E1122 Type 2 diabetes mellitus with diabetic chronic kidney disease: Secondary | ICD-10-CM | POA: Diagnosis not present

## 2018-03-13 DIAGNOSIS — D509 Iron deficiency anemia, unspecified: Secondary | ICD-10-CM | POA: Diagnosis not present

## 2018-03-13 DIAGNOSIS — E1122 Type 2 diabetes mellitus with diabetic chronic kidney disease: Secondary | ICD-10-CM | POA: Diagnosis not present

## 2018-03-13 DIAGNOSIS — D689 Coagulation defect, unspecified: Secondary | ICD-10-CM | POA: Diagnosis not present

## 2018-03-13 DIAGNOSIS — E039 Hypothyroidism, unspecified: Secondary | ICD-10-CM | POA: Diagnosis not present

## 2018-03-13 DIAGNOSIS — N2581 Secondary hyperparathyroidism of renal origin: Secondary | ICD-10-CM | POA: Diagnosis not present

## 2018-03-13 DIAGNOSIS — N186 End stage renal disease: Secondary | ICD-10-CM | POA: Diagnosis not present

## 2018-03-13 DIAGNOSIS — D631 Anemia in chronic kidney disease: Secondary | ICD-10-CM | POA: Diagnosis not present

## 2018-03-16 DIAGNOSIS — E039 Hypothyroidism, unspecified: Secondary | ICD-10-CM | POA: Diagnosis not present

## 2018-03-16 DIAGNOSIS — E1122 Type 2 diabetes mellitus with diabetic chronic kidney disease: Secondary | ICD-10-CM | POA: Diagnosis not present

## 2018-03-16 DIAGNOSIS — D689 Coagulation defect, unspecified: Secondary | ICD-10-CM | POA: Diagnosis not present

## 2018-03-16 DIAGNOSIS — D509 Iron deficiency anemia, unspecified: Secondary | ICD-10-CM | POA: Diagnosis not present

## 2018-03-16 DIAGNOSIS — D631 Anemia in chronic kidney disease: Secondary | ICD-10-CM | POA: Diagnosis not present

## 2018-03-16 DIAGNOSIS — N2581 Secondary hyperparathyroidism of renal origin: Secondary | ICD-10-CM | POA: Diagnosis not present

## 2018-03-16 DIAGNOSIS — N186 End stage renal disease: Secondary | ICD-10-CM | POA: Diagnosis not present

## 2018-03-20 DIAGNOSIS — E039 Hypothyroidism, unspecified: Secondary | ICD-10-CM | POA: Diagnosis not present

## 2018-03-20 DIAGNOSIS — N2581 Secondary hyperparathyroidism of renal origin: Secondary | ICD-10-CM | POA: Diagnosis not present

## 2018-03-20 DIAGNOSIS — E1122 Type 2 diabetes mellitus with diabetic chronic kidney disease: Secondary | ICD-10-CM | POA: Diagnosis not present

## 2018-03-20 DIAGNOSIS — D509 Iron deficiency anemia, unspecified: Secondary | ICD-10-CM | POA: Diagnosis not present

## 2018-03-20 DIAGNOSIS — D631 Anemia in chronic kidney disease: Secondary | ICD-10-CM | POA: Diagnosis not present

## 2018-03-20 DIAGNOSIS — D689 Coagulation defect, unspecified: Secondary | ICD-10-CM | POA: Diagnosis not present

## 2018-03-20 DIAGNOSIS — N186 End stage renal disease: Secondary | ICD-10-CM | POA: Diagnosis not present

## 2018-03-22 DIAGNOSIS — D689 Coagulation defect, unspecified: Secondary | ICD-10-CM | POA: Diagnosis not present

## 2018-03-22 DIAGNOSIS — N186 End stage renal disease: Secondary | ICD-10-CM | POA: Diagnosis not present

## 2018-03-22 DIAGNOSIS — N2581 Secondary hyperparathyroidism of renal origin: Secondary | ICD-10-CM | POA: Diagnosis not present

## 2018-03-22 DIAGNOSIS — E039 Hypothyroidism, unspecified: Secondary | ICD-10-CM | POA: Diagnosis not present

## 2018-03-22 DIAGNOSIS — D631 Anemia in chronic kidney disease: Secondary | ICD-10-CM | POA: Diagnosis not present

## 2018-03-22 DIAGNOSIS — D509 Iron deficiency anemia, unspecified: Secondary | ICD-10-CM | POA: Diagnosis not present

## 2018-03-22 DIAGNOSIS — E1122 Type 2 diabetes mellitus with diabetic chronic kidney disease: Secondary | ICD-10-CM | POA: Diagnosis not present

## 2018-03-24 DIAGNOSIS — N186 End stage renal disease: Secondary | ICD-10-CM | POA: Diagnosis not present

## 2018-03-24 DIAGNOSIS — E039 Hypothyroidism, unspecified: Secondary | ICD-10-CM | POA: Diagnosis not present

## 2018-03-24 DIAGNOSIS — E1122 Type 2 diabetes mellitus with diabetic chronic kidney disease: Secondary | ICD-10-CM | POA: Diagnosis not present

## 2018-03-24 DIAGNOSIS — D631 Anemia in chronic kidney disease: Secondary | ICD-10-CM | POA: Diagnosis not present

## 2018-03-24 DIAGNOSIS — D509 Iron deficiency anemia, unspecified: Secondary | ICD-10-CM | POA: Diagnosis not present

## 2018-03-24 DIAGNOSIS — N2581 Secondary hyperparathyroidism of renal origin: Secondary | ICD-10-CM | POA: Diagnosis not present

## 2018-03-24 DIAGNOSIS — D689 Coagulation defect, unspecified: Secondary | ICD-10-CM | POA: Diagnosis not present

## 2018-03-27 DIAGNOSIS — E039 Hypothyroidism, unspecified: Secondary | ICD-10-CM | POA: Diagnosis not present

## 2018-03-27 DIAGNOSIS — D689 Coagulation defect, unspecified: Secondary | ICD-10-CM | POA: Diagnosis not present

## 2018-03-27 DIAGNOSIS — E1122 Type 2 diabetes mellitus with diabetic chronic kidney disease: Secondary | ICD-10-CM | POA: Diagnosis not present

## 2018-03-27 DIAGNOSIS — D509 Iron deficiency anemia, unspecified: Secondary | ICD-10-CM | POA: Diagnosis not present

## 2018-03-27 DIAGNOSIS — N2581 Secondary hyperparathyroidism of renal origin: Secondary | ICD-10-CM | POA: Diagnosis not present

## 2018-03-27 DIAGNOSIS — D631 Anemia in chronic kidney disease: Secondary | ICD-10-CM | POA: Diagnosis not present

## 2018-03-27 DIAGNOSIS — N186 End stage renal disease: Secondary | ICD-10-CM | POA: Diagnosis not present

## 2018-03-29 DIAGNOSIS — E039 Hypothyroidism, unspecified: Secondary | ICD-10-CM | POA: Diagnosis not present

## 2018-03-29 DIAGNOSIS — D689 Coagulation defect, unspecified: Secondary | ICD-10-CM | POA: Diagnosis not present

## 2018-03-29 DIAGNOSIS — D631 Anemia in chronic kidney disease: Secondary | ICD-10-CM | POA: Diagnosis not present

## 2018-03-29 DIAGNOSIS — D509 Iron deficiency anemia, unspecified: Secondary | ICD-10-CM | POA: Diagnosis not present

## 2018-03-29 DIAGNOSIS — E1122 Type 2 diabetes mellitus with diabetic chronic kidney disease: Secondary | ICD-10-CM | POA: Diagnosis not present

## 2018-03-29 DIAGNOSIS — N2581 Secondary hyperparathyroidism of renal origin: Secondary | ICD-10-CM | POA: Diagnosis not present

## 2018-03-29 DIAGNOSIS — N186 End stage renal disease: Secondary | ICD-10-CM | POA: Diagnosis not present

## 2018-03-31 DIAGNOSIS — E1122 Type 2 diabetes mellitus with diabetic chronic kidney disease: Secondary | ICD-10-CM | POA: Diagnosis not present

## 2018-03-31 DIAGNOSIS — D631 Anemia in chronic kidney disease: Secondary | ICD-10-CM | POA: Diagnosis not present

## 2018-03-31 DIAGNOSIS — D509 Iron deficiency anemia, unspecified: Secondary | ICD-10-CM | POA: Diagnosis not present

## 2018-03-31 DIAGNOSIS — Z992 Dependence on renal dialysis: Secondary | ICD-10-CM | POA: Diagnosis not present

## 2018-03-31 DIAGNOSIS — E039 Hypothyroidism, unspecified: Secondary | ICD-10-CM | POA: Diagnosis not present

## 2018-03-31 DIAGNOSIS — D689 Coagulation defect, unspecified: Secondary | ICD-10-CM | POA: Diagnosis not present

## 2018-03-31 DIAGNOSIS — N2581 Secondary hyperparathyroidism of renal origin: Secondary | ICD-10-CM | POA: Diagnosis not present

## 2018-03-31 DIAGNOSIS — N186 End stage renal disease: Secondary | ICD-10-CM | POA: Diagnosis not present

## 2018-04-03 DIAGNOSIS — E1122 Type 2 diabetes mellitus with diabetic chronic kidney disease: Secondary | ICD-10-CM | POA: Diagnosis not present

## 2018-04-03 DIAGNOSIS — D631 Anemia in chronic kidney disease: Secondary | ICD-10-CM | POA: Diagnosis not present

## 2018-04-03 DIAGNOSIS — N186 End stage renal disease: Secondary | ICD-10-CM | POA: Diagnosis not present

## 2018-04-03 DIAGNOSIS — D689 Coagulation defect, unspecified: Secondary | ICD-10-CM | POA: Diagnosis not present

## 2018-04-03 DIAGNOSIS — D509 Iron deficiency anemia, unspecified: Secondary | ICD-10-CM | POA: Diagnosis not present

## 2018-04-03 DIAGNOSIS — N2581 Secondary hyperparathyroidism of renal origin: Secondary | ICD-10-CM | POA: Diagnosis not present

## 2018-04-03 DIAGNOSIS — E039 Hypothyroidism, unspecified: Secondary | ICD-10-CM | POA: Diagnosis not present

## 2018-04-06 DIAGNOSIS — D689 Coagulation defect, unspecified: Secondary | ICD-10-CM | POA: Diagnosis not present

## 2018-04-06 DIAGNOSIS — D509 Iron deficiency anemia, unspecified: Secondary | ICD-10-CM | POA: Diagnosis not present

## 2018-04-06 DIAGNOSIS — E039 Hypothyroidism, unspecified: Secondary | ICD-10-CM | POA: Diagnosis not present

## 2018-04-06 DIAGNOSIS — N2581 Secondary hyperparathyroidism of renal origin: Secondary | ICD-10-CM | POA: Diagnosis not present

## 2018-04-06 DIAGNOSIS — E1122 Type 2 diabetes mellitus with diabetic chronic kidney disease: Secondary | ICD-10-CM | POA: Diagnosis not present

## 2018-04-06 DIAGNOSIS — D631 Anemia in chronic kidney disease: Secondary | ICD-10-CM | POA: Diagnosis not present

## 2018-04-06 DIAGNOSIS — N186 End stage renal disease: Secondary | ICD-10-CM | POA: Diagnosis not present

## 2018-04-08 DIAGNOSIS — N186 End stage renal disease: Secondary | ICD-10-CM | POA: Diagnosis not present

## 2018-04-08 DIAGNOSIS — D509 Iron deficiency anemia, unspecified: Secondary | ICD-10-CM | POA: Diagnosis not present

## 2018-04-08 DIAGNOSIS — E1122 Type 2 diabetes mellitus with diabetic chronic kidney disease: Secondary | ICD-10-CM | POA: Diagnosis not present

## 2018-04-08 DIAGNOSIS — D689 Coagulation defect, unspecified: Secondary | ICD-10-CM | POA: Diagnosis not present

## 2018-04-08 DIAGNOSIS — E039 Hypothyroidism, unspecified: Secondary | ICD-10-CM | POA: Diagnosis not present

## 2018-04-08 DIAGNOSIS — D631 Anemia in chronic kidney disease: Secondary | ICD-10-CM | POA: Diagnosis not present

## 2018-04-08 DIAGNOSIS — N2581 Secondary hyperparathyroidism of renal origin: Secondary | ICD-10-CM | POA: Diagnosis not present

## 2018-04-10 DIAGNOSIS — E1122 Type 2 diabetes mellitus with diabetic chronic kidney disease: Secondary | ICD-10-CM | POA: Diagnosis not present

## 2018-04-10 DIAGNOSIS — D631 Anemia in chronic kidney disease: Secondary | ICD-10-CM | POA: Diagnosis not present

## 2018-04-10 DIAGNOSIS — E039 Hypothyroidism, unspecified: Secondary | ICD-10-CM | POA: Diagnosis not present

## 2018-04-10 DIAGNOSIS — N2581 Secondary hyperparathyroidism of renal origin: Secondary | ICD-10-CM | POA: Diagnosis not present

## 2018-04-10 DIAGNOSIS — D689 Coagulation defect, unspecified: Secondary | ICD-10-CM | POA: Diagnosis not present

## 2018-04-10 DIAGNOSIS — D509 Iron deficiency anemia, unspecified: Secondary | ICD-10-CM | POA: Diagnosis not present

## 2018-04-10 DIAGNOSIS — N186 End stage renal disease: Secondary | ICD-10-CM | POA: Diagnosis not present

## 2018-04-13 DIAGNOSIS — N2581 Secondary hyperparathyroidism of renal origin: Secondary | ICD-10-CM | POA: Diagnosis not present

## 2018-04-13 DIAGNOSIS — N186 End stage renal disease: Secondary | ICD-10-CM | POA: Diagnosis not present

## 2018-04-13 DIAGNOSIS — D631 Anemia in chronic kidney disease: Secondary | ICD-10-CM | POA: Diagnosis not present

## 2018-04-13 DIAGNOSIS — E1122 Type 2 diabetes mellitus with diabetic chronic kidney disease: Secondary | ICD-10-CM | POA: Diagnosis not present

## 2018-04-13 DIAGNOSIS — D509 Iron deficiency anemia, unspecified: Secondary | ICD-10-CM | POA: Diagnosis not present

## 2018-04-13 DIAGNOSIS — E039 Hypothyroidism, unspecified: Secondary | ICD-10-CM | POA: Diagnosis not present

## 2018-04-13 DIAGNOSIS — D689 Coagulation defect, unspecified: Secondary | ICD-10-CM | POA: Diagnosis not present

## 2018-04-15 DIAGNOSIS — D689 Coagulation defect, unspecified: Secondary | ICD-10-CM | POA: Diagnosis not present

## 2018-04-15 DIAGNOSIS — D509 Iron deficiency anemia, unspecified: Secondary | ICD-10-CM | POA: Diagnosis not present

## 2018-04-15 DIAGNOSIS — N2581 Secondary hyperparathyroidism of renal origin: Secondary | ICD-10-CM | POA: Diagnosis not present

## 2018-04-15 DIAGNOSIS — E1122 Type 2 diabetes mellitus with diabetic chronic kidney disease: Secondary | ICD-10-CM | POA: Diagnosis not present

## 2018-04-15 DIAGNOSIS — E039 Hypothyroidism, unspecified: Secondary | ICD-10-CM | POA: Diagnosis not present

## 2018-04-15 DIAGNOSIS — D631 Anemia in chronic kidney disease: Secondary | ICD-10-CM | POA: Diagnosis not present

## 2018-04-15 DIAGNOSIS — N186 End stage renal disease: Secondary | ICD-10-CM | POA: Diagnosis not present

## 2018-04-16 ENCOUNTER — Institutional Professional Consult (permissible substitution): Payer: Medicare Other | Admitting: Neurology

## 2018-04-17 DIAGNOSIS — E1122 Type 2 diabetes mellitus with diabetic chronic kidney disease: Secondary | ICD-10-CM | POA: Diagnosis not present

## 2018-04-17 DIAGNOSIS — N2581 Secondary hyperparathyroidism of renal origin: Secondary | ICD-10-CM | POA: Diagnosis not present

## 2018-04-17 DIAGNOSIS — E039 Hypothyroidism, unspecified: Secondary | ICD-10-CM | POA: Diagnosis not present

## 2018-04-17 DIAGNOSIS — N186 End stage renal disease: Secondary | ICD-10-CM | POA: Diagnosis not present

## 2018-04-17 DIAGNOSIS — D689 Coagulation defect, unspecified: Secondary | ICD-10-CM | POA: Diagnosis not present

## 2018-04-17 DIAGNOSIS — D631 Anemia in chronic kidney disease: Secondary | ICD-10-CM | POA: Diagnosis not present

## 2018-04-17 DIAGNOSIS — D509 Iron deficiency anemia, unspecified: Secondary | ICD-10-CM | POA: Diagnosis not present

## 2018-04-20 ENCOUNTER — Encounter: Payer: Self-pay | Admitting: Neurology

## 2018-04-20 DIAGNOSIS — E1122 Type 2 diabetes mellitus with diabetic chronic kidney disease: Secondary | ICD-10-CM | POA: Diagnosis not present

## 2018-04-20 DIAGNOSIS — N2581 Secondary hyperparathyroidism of renal origin: Secondary | ICD-10-CM | POA: Diagnosis not present

## 2018-04-20 DIAGNOSIS — E039 Hypothyroidism, unspecified: Secondary | ICD-10-CM | POA: Diagnosis not present

## 2018-04-20 DIAGNOSIS — D689 Coagulation defect, unspecified: Secondary | ICD-10-CM | POA: Diagnosis not present

## 2018-04-20 DIAGNOSIS — N186 End stage renal disease: Secondary | ICD-10-CM | POA: Diagnosis not present

## 2018-04-20 DIAGNOSIS — D509 Iron deficiency anemia, unspecified: Secondary | ICD-10-CM | POA: Diagnosis not present

## 2018-04-20 DIAGNOSIS — D631 Anemia in chronic kidney disease: Secondary | ICD-10-CM | POA: Diagnosis not present

## 2018-04-22 DIAGNOSIS — N186 End stage renal disease: Secondary | ICD-10-CM | POA: Diagnosis not present

## 2018-04-22 DIAGNOSIS — D631 Anemia in chronic kidney disease: Secondary | ICD-10-CM | POA: Diagnosis not present

## 2018-04-22 DIAGNOSIS — N2581 Secondary hyperparathyroidism of renal origin: Secondary | ICD-10-CM | POA: Diagnosis not present

## 2018-04-22 DIAGNOSIS — D689 Coagulation defect, unspecified: Secondary | ICD-10-CM | POA: Diagnosis not present

## 2018-04-22 DIAGNOSIS — E1122 Type 2 diabetes mellitus with diabetic chronic kidney disease: Secondary | ICD-10-CM | POA: Diagnosis not present

## 2018-04-22 DIAGNOSIS — E039 Hypothyroidism, unspecified: Secondary | ICD-10-CM | POA: Diagnosis not present

## 2018-04-22 DIAGNOSIS — D509 Iron deficiency anemia, unspecified: Secondary | ICD-10-CM | POA: Diagnosis not present

## 2018-04-24 DIAGNOSIS — D631 Anemia in chronic kidney disease: Secondary | ICD-10-CM | POA: Diagnosis not present

## 2018-04-24 DIAGNOSIS — E1122 Type 2 diabetes mellitus with diabetic chronic kidney disease: Secondary | ICD-10-CM | POA: Diagnosis not present

## 2018-04-24 DIAGNOSIS — D509 Iron deficiency anemia, unspecified: Secondary | ICD-10-CM | POA: Diagnosis not present

## 2018-04-24 DIAGNOSIS — E039 Hypothyroidism, unspecified: Secondary | ICD-10-CM | POA: Diagnosis not present

## 2018-04-24 DIAGNOSIS — N186 End stage renal disease: Secondary | ICD-10-CM | POA: Diagnosis not present

## 2018-04-24 DIAGNOSIS — N2581 Secondary hyperparathyroidism of renal origin: Secondary | ICD-10-CM | POA: Diagnosis not present

## 2018-04-24 DIAGNOSIS — D689 Coagulation defect, unspecified: Secondary | ICD-10-CM | POA: Diagnosis not present

## 2018-04-27 DIAGNOSIS — N186 End stage renal disease: Secondary | ICD-10-CM | POA: Diagnosis not present

## 2018-04-27 DIAGNOSIS — E1122 Type 2 diabetes mellitus with diabetic chronic kidney disease: Secondary | ICD-10-CM | POA: Diagnosis not present

## 2018-04-27 DIAGNOSIS — D631 Anemia in chronic kidney disease: Secondary | ICD-10-CM | POA: Diagnosis not present

## 2018-04-27 DIAGNOSIS — D689 Coagulation defect, unspecified: Secondary | ICD-10-CM | POA: Diagnosis not present

## 2018-04-27 DIAGNOSIS — D509 Iron deficiency anemia, unspecified: Secondary | ICD-10-CM | POA: Diagnosis not present

## 2018-04-27 DIAGNOSIS — N2581 Secondary hyperparathyroidism of renal origin: Secondary | ICD-10-CM | POA: Diagnosis not present

## 2018-04-27 DIAGNOSIS — E039 Hypothyroidism, unspecified: Secondary | ICD-10-CM | POA: Diagnosis not present

## 2018-04-29 DIAGNOSIS — D631 Anemia in chronic kidney disease: Secondary | ICD-10-CM | POA: Diagnosis not present

## 2018-04-29 DIAGNOSIS — E039 Hypothyroidism, unspecified: Secondary | ICD-10-CM | POA: Diagnosis not present

## 2018-04-29 DIAGNOSIS — D689 Coagulation defect, unspecified: Secondary | ICD-10-CM | POA: Diagnosis not present

## 2018-04-29 DIAGNOSIS — N2581 Secondary hyperparathyroidism of renal origin: Secondary | ICD-10-CM | POA: Diagnosis not present

## 2018-04-29 DIAGNOSIS — N186 End stage renal disease: Secondary | ICD-10-CM | POA: Diagnosis not present

## 2018-04-29 DIAGNOSIS — D509 Iron deficiency anemia, unspecified: Secondary | ICD-10-CM | POA: Diagnosis not present

## 2018-04-29 DIAGNOSIS — E1122 Type 2 diabetes mellitus with diabetic chronic kidney disease: Secondary | ICD-10-CM | POA: Diagnosis not present

## 2018-05-01 DIAGNOSIS — D509 Iron deficiency anemia, unspecified: Secondary | ICD-10-CM | POA: Diagnosis not present

## 2018-05-01 DIAGNOSIS — E039 Hypothyroidism, unspecified: Secondary | ICD-10-CM | POA: Diagnosis not present

## 2018-05-01 DIAGNOSIS — N2581 Secondary hyperparathyroidism of renal origin: Secondary | ICD-10-CM | POA: Diagnosis not present

## 2018-05-01 DIAGNOSIS — N186 End stage renal disease: Secondary | ICD-10-CM | POA: Diagnosis not present

## 2018-05-01 DIAGNOSIS — Z992 Dependence on renal dialysis: Secondary | ICD-10-CM | POA: Diagnosis not present

## 2018-05-01 DIAGNOSIS — D689 Coagulation defect, unspecified: Secondary | ICD-10-CM | POA: Diagnosis not present

## 2018-05-01 DIAGNOSIS — D631 Anemia in chronic kidney disease: Secondary | ICD-10-CM | POA: Diagnosis not present

## 2018-05-01 DIAGNOSIS — E1122 Type 2 diabetes mellitus with diabetic chronic kidney disease: Secondary | ICD-10-CM | POA: Diagnosis not present

## 2018-05-04 DIAGNOSIS — N186 End stage renal disease: Secondary | ICD-10-CM | POA: Diagnosis not present

## 2018-05-04 DIAGNOSIS — N2581 Secondary hyperparathyroidism of renal origin: Secondary | ICD-10-CM | POA: Diagnosis not present

## 2018-05-04 DIAGNOSIS — D631 Anemia in chronic kidney disease: Secondary | ICD-10-CM | POA: Diagnosis not present

## 2018-05-04 DIAGNOSIS — D689 Coagulation defect, unspecified: Secondary | ICD-10-CM | POA: Diagnosis not present

## 2018-05-04 DIAGNOSIS — E039 Hypothyroidism, unspecified: Secondary | ICD-10-CM | POA: Diagnosis not present

## 2018-05-04 DIAGNOSIS — E1122 Type 2 diabetes mellitus with diabetic chronic kidney disease: Secondary | ICD-10-CM | POA: Diagnosis not present

## 2018-05-06 DIAGNOSIS — D689 Coagulation defect, unspecified: Secondary | ICD-10-CM | POA: Diagnosis not present

## 2018-05-06 DIAGNOSIS — N186 End stage renal disease: Secondary | ICD-10-CM | POA: Diagnosis not present

## 2018-05-06 DIAGNOSIS — D631 Anemia in chronic kidney disease: Secondary | ICD-10-CM | POA: Diagnosis not present

## 2018-05-06 DIAGNOSIS — N2581 Secondary hyperparathyroidism of renal origin: Secondary | ICD-10-CM | POA: Diagnosis not present

## 2018-05-06 DIAGNOSIS — E039 Hypothyroidism, unspecified: Secondary | ICD-10-CM | POA: Diagnosis not present

## 2018-05-06 DIAGNOSIS — E1122 Type 2 diabetes mellitus with diabetic chronic kidney disease: Secondary | ICD-10-CM | POA: Diagnosis not present

## 2018-05-08 DIAGNOSIS — N186 End stage renal disease: Secondary | ICD-10-CM | POA: Diagnosis not present

## 2018-05-08 DIAGNOSIS — E1122 Type 2 diabetes mellitus with diabetic chronic kidney disease: Secondary | ICD-10-CM | POA: Diagnosis not present

## 2018-05-08 DIAGNOSIS — D631 Anemia in chronic kidney disease: Secondary | ICD-10-CM | POA: Diagnosis not present

## 2018-05-08 DIAGNOSIS — D689 Coagulation defect, unspecified: Secondary | ICD-10-CM | POA: Diagnosis not present

## 2018-05-08 DIAGNOSIS — E039 Hypothyroidism, unspecified: Secondary | ICD-10-CM | POA: Diagnosis not present

## 2018-05-08 DIAGNOSIS — N2581 Secondary hyperparathyroidism of renal origin: Secondary | ICD-10-CM | POA: Diagnosis not present

## 2018-05-11 DIAGNOSIS — N186 End stage renal disease: Secondary | ICD-10-CM | POA: Diagnosis not present

## 2018-05-11 DIAGNOSIS — E1122 Type 2 diabetes mellitus with diabetic chronic kidney disease: Secondary | ICD-10-CM | POA: Diagnosis not present

## 2018-05-11 DIAGNOSIS — D631 Anemia in chronic kidney disease: Secondary | ICD-10-CM | POA: Diagnosis not present

## 2018-05-11 DIAGNOSIS — N2581 Secondary hyperparathyroidism of renal origin: Secondary | ICD-10-CM | POA: Diagnosis not present

## 2018-05-11 DIAGNOSIS — E039 Hypothyroidism, unspecified: Secondary | ICD-10-CM | POA: Diagnosis not present

## 2018-05-11 DIAGNOSIS — D689 Coagulation defect, unspecified: Secondary | ICD-10-CM | POA: Diagnosis not present

## 2018-05-13 DIAGNOSIS — N186 End stage renal disease: Secondary | ICD-10-CM | POA: Diagnosis not present

## 2018-05-13 DIAGNOSIS — E1122 Type 2 diabetes mellitus with diabetic chronic kidney disease: Secondary | ICD-10-CM | POA: Diagnosis not present

## 2018-05-13 DIAGNOSIS — D631 Anemia in chronic kidney disease: Secondary | ICD-10-CM | POA: Diagnosis not present

## 2018-05-13 DIAGNOSIS — N2581 Secondary hyperparathyroidism of renal origin: Secondary | ICD-10-CM | POA: Diagnosis not present

## 2018-05-13 DIAGNOSIS — E039 Hypothyroidism, unspecified: Secondary | ICD-10-CM | POA: Diagnosis not present

## 2018-05-13 DIAGNOSIS — D689 Coagulation defect, unspecified: Secondary | ICD-10-CM | POA: Diagnosis not present

## 2018-05-15 DIAGNOSIS — E1122 Type 2 diabetes mellitus with diabetic chronic kidney disease: Secondary | ICD-10-CM | POA: Diagnosis not present

## 2018-05-15 DIAGNOSIS — N186 End stage renal disease: Secondary | ICD-10-CM | POA: Diagnosis not present

## 2018-05-15 DIAGNOSIS — D631 Anemia in chronic kidney disease: Secondary | ICD-10-CM | POA: Diagnosis not present

## 2018-05-15 DIAGNOSIS — E039 Hypothyroidism, unspecified: Secondary | ICD-10-CM | POA: Diagnosis not present

## 2018-05-15 DIAGNOSIS — D689 Coagulation defect, unspecified: Secondary | ICD-10-CM | POA: Diagnosis not present

## 2018-05-15 DIAGNOSIS — N2581 Secondary hyperparathyroidism of renal origin: Secondary | ICD-10-CM | POA: Diagnosis not present

## 2018-05-18 DIAGNOSIS — D689 Coagulation defect, unspecified: Secondary | ICD-10-CM | POA: Diagnosis not present

## 2018-05-18 DIAGNOSIS — N186 End stage renal disease: Secondary | ICD-10-CM | POA: Diagnosis not present

## 2018-05-18 DIAGNOSIS — E1122 Type 2 diabetes mellitus with diabetic chronic kidney disease: Secondary | ICD-10-CM | POA: Diagnosis not present

## 2018-05-18 DIAGNOSIS — N2581 Secondary hyperparathyroidism of renal origin: Secondary | ICD-10-CM | POA: Diagnosis not present

## 2018-05-18 DIAGNOSIS — E039 Hypothyroidism, unspecified: Secondary | ICD-10-CM | POA: Diagnosis not present

## 2018-05-18 DIAGNOSIS — D631 Anemia in chronic kidney disease: Secondary | ICD-10-CM | POA: Diagnosis not present

## 2018-05-20 DIAGNOSIS — E039 Hypothyroidism, unspecified: Secondary | ICD-10-CM | POA: Diagnosis not present

## 2018-05-20 DIAGNOSIS — N186 End stage renal disease: Secondary | ICD-10-CM | POA: Diagnosis not present

## 2018-05-20 DIAGNOSIS — D689 Coagulation defect, unspecified: Secondary | ICD-10-CM | POA: Diagnosis not present

## 2018-05-20 DIAGNOSIS — E1122 Type 2 diabetes mellitus with diabetic chronic kidney disease: Secondary | ICD-10-CM | POA: Diagnosis not present

## 2018-05-20 DIAGNOSIS — N2581 Secondary hyperparathyroidism of renal origin: Secondary | ICD-10-CM | POA: Diagnosis not present

## 2018-05-20 DIAGNOSIS — D631 Anemia in chronic kidney disease: Secondary | ICD-10-CM | POA: Diagnosis not present

## 2018-05-22 DIAGNOSIS — N2581 Secondary hyperparathyroidism of renal origin: Secondary | ICD-10-CM | POA: Diagnosis not present

## 2018-05-22 DIAGNOSIS — E1122 Type 2 diabetes mellitus with diabetic chronic kidney disease: Secondary | ICD-10-CM | POA: Diagnosis not present

## 2018-05-22 DIAGNOSIS — D689 Coagulation defect, unspecified: Secondary | ICD-10-CM | POA: Diagnosis not present

## 2018-05-22 DIAGNOSIS — N186 End stage renal disease: Secondary | ICD-10-CM | POA: Diagnosis not present

## 2018-05-22 DIAGNOSIS — E039 Hypothyroidism, unspecified: Secondary | ICD-10-CM | POA: Diagnosis not present

## 2018-05-22 DIAGNOSIS — D631 Anemia in chronic kidney disease: Secondary | ICD-10-CM | POA: Diagnosis not present

## 2018-05-25 DIAGNOSIS — E1122 Type 2 diabetes mellitus with diabetic chronic kidney disease: Secondary | ICD-10-CM | POA: Diagnosis not present

## 2018-05-25 DIAGNOSIS — N186 End stage renal disease: Secondary | ICD-10-CM | POA: Diagnosis not present

## 2018-05-25 DIAGNOSIS — N2581 Secondary hyperparathyroidism of renal origin: Secondary | ICD-10-CM | POA: Diagnosis not present

## 2018-05-25 DIAGNOSIS — D689 Coagulation defect, unspecified: Secondary | ICD-10-CM | POA: Diagnosis not present

## 2018-05-25 DIAGNOSIS — E039 Hypothyroidism, unspecified: Secondary | ICD-10-CM | POA: Diagnosis not present

## 2018-05-25 DIAGNOSIS — D631 Anemia in chronic kidney disease: Secondary | ICD-10-CM | POA: Diagnosis not present

## 2018-05-27 DIAGNOSIS — N186 End stage renal disease: Secondary | ICD-10-CM | POA: Diagnosis not present

## 2018-05-27 DIAGNOSIS — N2581 Secondary hyperparathyroidism of renal origin: Secondary | ICD-10-CM | POA: Diagnosis not present

## 2018-05-27 DIAGNOSIS — D689 Coagulation defect, unspecified: Secondary | ICD-10-CM | POA: Diagnosis not present

## 2018-05-27 DIAGNOSIS — D631 Anemia in chronic kidney disease: Secondary | ICD-10-CM | POA: Diagnosis not present

## 2018-05-27 DIAGNOSIS — E039 Hypothyroidism, unspecified: Secondary | ICD-10-CM | POA: Diagnosis not present

## 2018-05-27 DIAGNOSIS — E1122 Type 2 diabetes mellitus with diabetic chronic kidney disease: Secondary | ICD-10-CM | POA: Diagnosis not present

## 2018-05-29 DIAGNOSIS — N186 End stage renal disease: Secondary | ICD-10-CM | POA: Diagnosis not present

## 2018-05-29 DIAGNOSIS — E1122 Type 2 diabetes mellitus with diabetic chronic kidney disease: Secondary | ICD-10-CM | POA: Diagnosis not present

## 2018-05-29 DIAGNOSIS — N2581 Secondary hyperparathyroidism of renal origin: Secondary | ICD-10-CM | POA: Diagnosis not present

## 2018-05-29 DIAGNOSIS — D689 Coagulation defect, unspecified: Secondary | ICD-10-CM | POA: Diagnosis not present

## 2018-05-29 DIAGNOSIS — D631 Anemia in chronic kidney disease: Secondary | ICD-10-CM | POA: Diagnosis not present

## 2018-05-29 DIAGNOSIS — E039 Hypothyroidism, unspecified: Secondary | ICD-10-CM | POA: Diagnosis not present

## 2018-05-30 DIAGNOSIS — N186 End stage renal disease: Secondary | ICD-10-CM | POA: Diagnosis not present

## 2018-05-30 DIAGNOSIS — Z992 Dependence on renal dialysis: Secondary | ICD-10-CM | POA: Diagnosis not present

## 2018-05-30 DIAGNOSIS — E1122 Type 2 diabetes mellitus with diabetic chronic kidney disease: Secondary | ICD-10-CM | POA: Diagnosis not present

## 2018-06-01 DIAGNOSIS — E039 Hypothyroidism, unspecified: Secondary | ICD-10-CM | POA: Diagnosis not present

## 2018-06-01 DIAGNOSIS — D631 Anemia in chronic kidney disease: Secondary | ICD-10-CM | POA: Diagnosis not present

## 2018-06-01 DIAGNOSIS — D689 Coagulation defect, unspecified: Secondary | ICD-10-CM | POA: Diagnosis not present

## 2018-06-01 DIAGNOSIS — E1122 Type 2 diabetes mellitus with diabetic chronic kidney disease: Secondary | ICD-10-CM | POA: Diagnosis not present

## 2018-06-01 DIAGNOSIS — N186 End stage renal disease: Secondary | ICD-10-CM | POA: Diagnosis not present

## 2018-06-01 DIAGNOSIS — Z992 Dependence on renal dialysis: Secondary | ICD-10-CM | POA: Diagnosis not present

## 2018-06-01 DIAGNOSIS — N2581 Secondary hyperparathyroidism of renal origin: Secondary | ICD-10-CM | POA: Diagnosis not present

## 2018-06-03 DIAGNOSIS — D631 Anemia in chronic kidney disease: Secondary | ICD-10-CM | POA: Diagnosis not present

## 2018-06-03 DIAGNOSIS — Z992 Dependence on renal dialysis: Secondary | ICD-10-CM | POA: Diagnosis not present

## 2018-06-03 DIAGNOSIS — D689 Coagulation defect, unspecified: Secondary | ICD-10-CM | POA: Diagnosis not present

## 2018-06-03 DIAGNOSIS — N2581 Secondary hyperparathyroidism of renal origin: Secondary | ICD-10-CM | POA: Diagnosis not present

## 2018-06-03 DIAGNOSIS — E1122 Type 2 diabetes mellitus with diabetic chronic kidney disease: Secondary | ICD-10-CM | POA: Diagnosis not present

## 2018-06-03 DIAGNOSIS — E039 Hypothyroidism, unspecified: Secondary | ICD-10-CM | POA: Diagnosis not present

## 2018-06-03 DIAGNOSIS — N186 End stage renal disease: Secondary | ICD-10-CM | POA: Diagnosis not present

## 2018-06-05 DIAGNOSIS — D631 Anemia in chronic kidney disease: Secondary | ICD-10-CM | POA: Diagnosis not present

## 2018-06-05 DIAGNOSIS — N186 End stage renal disease: Secondary | ICD-10-CM | POA: Diagnosis not present

## 2018-06-05 DIAGNOSIS — D689 Coagulation defect, unspecified: Secondary | ICD-10-CM | POA: Diagnosis not present

## 2018-06-05 DIAGNOSIS — E1122 Type 2 diabetes mellitus with diabetic chronic kidney disease: Secondary | ICD-10-CM | POA: Diagnosis not present

## 2018-06-05 DIAGNOSIS — Z992 Dependence on renal dialysis: Secondary | ICD-10-CM | POA: Diagnosis not present

## 2018-06-05 DIAGNOSIS — N2581 Secondary hyperparathyroidism of renal origin: Secondary | ICD-10-CM | POA: Diagnosis not present

## 2018-06-05 DIAGNOSIS — E039 Hypothyroidism, unspecified: Secondary | ICD-10-CM | POA: Diagnosis not present

## 2018-06-08 DIAGNOSIS — N2581 Secondary hyperparathyroidism of renal origin: Secondary | ICD-10-CM | POA: Diagnosis not present

## 2018-06-08 DIAGNOSIS — N186 End stage renal disease: Secondary | ICD-10-CM | POA: Diagnosis not present

## 2018-06-08 DIAGNOSIS — D689 Coagulation defect, unspecified: Secondary | ICD-10-CM | POA: Diagnosis not present

## 2018-06-08 DIAGNOSIS — Z992 Dependence on renal dialysis: Secondary | ICD-10-CM | POA: Diagnosis not present

## 2018-06-08 DIAGNOSIS — D631 Anemia in chronic kidney disease: Secondary | ICD-10-CM | POA: Diagnosis not present

## 2018-06-08 DIAGNOSIS — E039 Hypothyroidism, unspecified: Secondary | ICD-10-CM | POA: Diagnosis not present

## 2018-06-08 DIAGNOSIS — E1122 Type 2 diabetes mellitus with diabetic chronic kidney disease: Secondary | ICD-10-CM | POA: Diagnosis not present

## 2018-06-10 DIAGNOSIS — Z01818 Encounter for other preprocedural examination: Principal | ICD-10-CM

## 2018-06-10 DIAGNOSIS — Z7289 Other problems related to lifestyle: Principal | ICD-10-CM

## 2018-06-10 DIAGNOSIS — N186 End stage renal disease: Principal | ICD-10-CM

## 2018-06-10 DIAGNOSIS — R93429 Abnormal radiologic findings on diagnostic imaging of unspecified kidney: Principal | ICD-10-CM

## 2018-06-10 DIAGNOSIS — Z125 Encounter for screening for malignant neoplasm of prostate: Principal | ICD-10-CM

## 2018-06-10 DIAGNOSIS — Z114 Encounter for screening for human immunodeficiency virus [HIV]: Principal | ICD-10-CM

## 2018-06-10 DIAGNOSIS — E1122 Type 2 diabetes mellitus with diabetic chronic kidney disease: Principal | ICD-10-CM

## 2018-06-10 DIAGNOSIS — Z794 Long term (current) use of insulin: Principal | ICD-10-CM

## 2018-06-10 DIAGNOSIS — Z992 Dependence on renal dialysis: Principal | ICD-10-CM

## 2018-06-10 DIAGNOSIS — E039 Hypothyroidism, unspecified: Secondary | ICD-10-CM | POA: Diagnosis not present

## 2018-06-10 DIAGNOSIS — D631 Anemia in chronic kidney disease: Secondary | ICD-10-CM | POA: Diagnosis not present

## 2018-06-10 DIAGNOSIS — D689 Coagulation defect, unspecified: Secondary | ICD-10-CM | POA: Diagnosis not present

## 2018-06-10 DIAGNOSIS — N2581 Secondary hyperparathyroidism of renal origin: Secondary | ICD-10-CM | POA: Diagnosis not present

## 2018-06-12 DIAGNOSIS — E1122 Type 2 diabetes mellitus with diabetic chronic kidney disease: Secondary | ICD-10-CM | POA: Diagnosis not present

## 2018-06-12 DIAGNOSIS — D689 Coagulation defect, unspecified: Secondary | ICD-10-CM | POA: Diagnosis not present

## 2018-06-12 DIAGNOSIS — D631 Anemia in chronic kidney disease: Secondary | ICD-10-CM | POA: Diagnosis not present

## 2018-06-12 DIAGNOSIS — N2581 Secondary hyperparathyroidism of renal origin: Secondary | ICD-10-CM | POA: Diagnosis not present

## 2018-06-12 DIAGNOSIS — Z992 Dependence on renal dialysis: Secondary | ICD-10-CM | POA: Diagnosis not present

## 2018-06-12 DIAGNOSIS — N186 End stage renal disease: Secondary | ICD-10-CM | POA: Diagnosis not present

## 2018-06-12 DIAGNOSIS — E039 Hypothyroidism, unspecified: Secondary | ICD-10-CM | POA: Diagnosis not present

## 2018-06-15 DIAGNOSIS — E1122 Type 2 diabetes mellitus with diabetic chronic kidney disease: Secondary | ICD-10-CM | POA: Diagnosis not present

## 2018-06-15 DIAGNOSIS — E039 Hypothyroidism, unspecified: Secondary | ICD-10-CM | POA: Diagnosis not present

## 2018-06-15 DIAGNOSIS — D689 Coagulation defect, unspecified: Secondary | ICD-10-CM | POA: Diagnosis not present

## 2018-06-15 DIAGNOSIS — N186 End stage renal disease: Secondary | ICD-10-CM | POA: Diagnosis not present

## 2018-06-15 DIAGNOSIS — Z992 Dependence on renal dialysis: Secondary | ICD-10-CM | POA: Diagnosis not present

## 2018-06-15 DIAGNOSIS — D631 Anemia in chronic kidney disease: Secondary | ICD-10-CM | POA: Diagnosis not present

## 2018-06-15 DIAGNOSIS — N2581 Secondary hyperparathyroidism of renal origin: Secondary | ICD-10-CM | POA: Diagnosis not present

## 2018-06-17 DIAGNOSIS — N186 End stage renal disease: Secondary | ICD-10-CM | POA: Diagnosis not present

## 2018-06-17 DIAGNOSIS — N2581 Secondary hyperparathyroidism of renal origin: Secondary | ICD-10-CM | POA: Diagnosis not present

## 2018-06-17 DIAGNOSIS — D689 Coagulation defect, unspecified: Secondary | ICD-10-CM | POA: Diagnosis not present

## 2018-06-17 DIAGNOSIS — E1122 Type 2 diabetes mellitus with diabetic chronic kidney disease: Secondary | ICD-10-CM | POA: Diagnosis not present

## 2018-06-17 DIAGNOSIS — Z992 Dependence on renal dialysis: Secondary | ICD-10-CM | POA: Diagnosis not present

## 2018-06-17 DIAGNOSIS — D631 Anemia in chronic kidney disease: Secondary | ICD-10-CM | POA: Diagnosis not present

## 2018-06-17 DIAGNOSIS — E039 Hypothyroidism, unspecified: Secondary | ICD-10-CM | POA: Diagnosis not present

## 2018-06-19 DIAGNOSIS — D689 Coagulation defect, unspecified: Secondary | ICD-10-CM | POA: Diagnosis not present

## 2018-06-19 DIAGNOSIS — N2581 Secondary hyperparathyroidism of renal origin: Secondary | ICD-10-CM | POA: Diagnosis not present

## 2018-06-19 DIAGNOSIS — E039 Hypothyroidism, unspecified: Secondary | ICD-10-CM | POA: Diagnosis not present

## 2018-06-19 DIAGNOSIS — E1122 Type 2 diabetes mellitus with diabetic chronic kidney disease: Secondary | ICD-10-CM | POA: Diagnosis not present

## 2018-06-19 DIAGNOSIS — Z992 Dependence on renal dialysis: Secondary | ICD-10-CM | POA: Diagnosis not present

## 2018-06-19 DIAGNOSIS — D631 Anemia in chronic kidney disease: Secondary | ICD-10-CM | POA: Diagnosis not present

## 2018-06-19 DIAGNOSIS — N186 End stage renal disease: Secondary | ICD-10-CM | POA: Diagnosis not present

## 2018-06-22 DIAGNOSIS — D631 Anemia in chronic kidney disease: Secondary | ICD-10-CM | POA: Diagnosis not present

## 2018-06-22 DIAGNOSIS — E039 Hypothyroidism, unspecified: Secondary | ICD-10-CM | POA: Diagnosis not present

## 2018-06-22 DIAGNOSIS — D689 Coagulation defect, unspecified: Secondary | ICD-10-CM | POA: Diagnosis not present

## 2018-06-22 DIAGNOSIS — N2581 Secondary hyperparathyroidism of renal origin: Secondary | ICD-10-CM | POA: Diagnosis not present

## 2018-06-22 DIAGNOSIS — N186 End stage renal disease: Secondary | ICD-10-CM | POA: Diagnosis not present

## 2018-06-22 DIAGNOSIS — E1122 Type 2 diabetes mellitus with diabetic chronic kidney disease: Secondary | ICD-10-CM | POA: Diagnosis not present

## 2018-06-22 DIAGNOSIS — Z992 Dependence on renal dialysis: Secondary | ICD-10-CM | POA: Diagnosis not present

## 2018-06-24 DIAGNOSIS — E039 Hypothyroidism, unspecified: Secondary | ICD-10-CM | POA: Diagnosis not present

## 2018-06-24 DIAGNOSIS — D689 Coagulation defect, unspecified: Secondary | ICD-10-CM | POA: Diagnosis not present

## 2018-06-24 DIAGNOSIS — Z992 Dependence on renal dialysis: Secondary | ICD-10-CM | POA: Diagnosis not present

## 2018-06-24 DIAGNOSIS — N2581 Secondary hyperparathyroidism of renal origin: Secondary | ICD-10-CM | POA: Diagnosis not present

## 2018-06-24 DIAGNOSIS — N186 End stage renal disease: Secondary | ICD-10-CM | POA: Diagnosis not present

## 2018-06-24 DIAGNOSIS — D631 Anemia in chronic kidney disease: Secondary | ICD-10-CM | POA: Diagnosis not present

## 2018-06-24 DIAGNOSIS — E1122 Type 2 diabetes mellitus with diabetic chronic kidney disease: Secondary | ICD-10-CM | POA: Diagnosis not present

## 2018-06-26 DIAGNOSIS — D631 Anemia in chronic kidney disease: Secondary | ICD-10-CM | POA: Diagnosis not present

## 2018-06-26 DIAGNOSIS — N186 End stage renal disease: Secondary | ICD-10-CM | POA: Diagnosis not present

## 2018-06-26 DIAGNOSIS — N2581 Secondary hyperparathyroidism of renal origin: Secondary | ICD-10-CM | POA: Diagnosis not present

## 2018-06-26 DIAGNOSIS — Z992 Dependence on renal dialysis: Secondary | ICD-10-CM | POA: Diagnosis not present

## 2018-06-26 DIAGNOSIS — E1122 Type 2 diabetes mellitus with diabetic chronic kidney disease: Secondary | ICD-10-CM | POA: Diagnosis not present

## 2018-06-26 DIAGNOSIS — D689 Coagulation defect, unspecified: Secondary | ICD-10-CM | POA: Diagnosis not present

## 2018-06-26 DIAGNOSIS — E039 Hypothyroidism, unspecified: Secondary | ICD-10-CM | POA: Diagnosis not present

## 2018-06-29 DIAGNOSIS — D631 Anemia in chronic kidney disease: Secondary | ICD-10-CM | POA: Diagnosis not present

## 2018-06-29 DIAGNOSIS — E1122 Type 2 diabetes mellitus with diabetic chronic kidney disease: Secondary | ICD-10-CM | POA: Diagnosis not present

## 2018-06-29 DIAGNOSIS — N2581 Secondary hyperparathyroidism of renal origin: Secondary | ICD-10-CM | POA: Diagnosis not present

## 2018-06-29 DIAGNOSIS — E039 Hypothyroidism, unspecified: Secondary | ICD-10-CM | POA: Diagnosis not present

## 2018-06-29 DIAGNOSIS — N186 End stage renal disease: Secondary | ICD-10-CM | POA: Diagnosis not present

## 2018-06-29 DIAGNOSIS — Z992 Dependence on renal dialysis: Secondary | ICD-10-CM | POA: Diagnosis not present

## 2018-06-29 DIAGNOSIS — D689 Coagulation defect, unspecified: Secondary | ICD-10-CM | POA: Diagnosis not present

## 2018-06-30 DIAGNOSIS — Z992 Dependence on renal dialysis: Secondary | ICD-10-CM | POA: Diagnosis not present

## 2018-06-30 DIAGNOSIS — E1122 Type 2 diabetes mellitus with diabetic chronic kidney disease: Secondary | ICD-10-CM | POA: Diagnosis not present

## 2018-06-30 DIAGNOSIS — N186 End stage renal disease: Secondary | ICD-10-CM | POA: Diagnosis not present

## 2018-07-01 DIAGNOSIS — N186 End stage renal disease: Secondary | ICD-10-CM | POA: Diagnosis not present

## 2018-07-01 DIAGNOSIS — E1122 Type 2 diabetes mellitus with diabetic chronic kidney disease: Secondary | ICD-10-CM | POA: Diagnosis not present

## 2018-07-01 DIAGNOSIS — R197 Diarrhea, unspecified: Secondary | ICD-10-CM | POA: Diagnosis not present

## 2018-07-01 DIAGNOSIS — N2581 Secondary hyperparathyroidism of renal origin: Secondary | ICD-10-CM | POA: Diagnosis not present

## 2018-07-01 DIAGNOSIS — D631 Anemia in chronic kidney disease: Secondary | ICD-10-CM | POA: Diagnosis not present

## 2018-07-01 DIAGNOSIS — E039 Hypothyroidism, unspecified: Secondary | ICD-10-CM | POA: Diagnosis not present

## 2018-07-01 DIAGNOSIS — D689 Coagulation defect, unspecified: Secondary | ICD-10-CM | POA: Diagnosis not present

## 2018-07-06 DIAGNOSIS — R197 Diarrhea, unspecified: Secondary | ICD-10-CM | POA: Diagnosis not present

## 2018-07-06 DIAGNOSIS — D689 Coagulation defect, unspecified: Secondary | ICD-10-CM | POA: Diagnosis not present

## 2018-07-06 DIAGNOSIS — N186 End stage renal disease: Secondary | ICD-10-CM | POA: Diagnosis not present

## 2018-07-06 DIAGNOSIS — E1122 Type 2 diabetes mellitus with diabetic chronic kidney disease: Secondary | ICD-10-CM | POA: Diagnosis not present

## 2018-07-06 DIAGNOSIS — D631 Anemia in chronic kidney disease: Secondary | ICD-10-CM | POA: Diagnosis not present

## 2018-07-06 DIAGNOSIS — E039 Hypothyroidism, unspecified: Secondary | ICD-10-CM | POA: Diagnosis not present

## 2018-07-06 DIAGNOSIS — N2581 Secondary hyperparathyroidism of renal origin: Secondary | ICD-10-CM | POA: Diagnosis not present

## 2018-07-08 DIAGNOSIS — D631 Anemia in chronic kidney disease: Secondary | ICD-10-CM | POA: Diagnosis not present

## 2018-07-08 DIAGNOSIS — R197 Diarrhea, unspecified: Secondary | ICD-10-CM | POA: Diagnosis not present

## 2018-07-08 DIAGNOSIS — E039 Hypothyroidism, unspecified: Secondary | ICD-10-CM | POA: Diagnosis not present

## 2018-07-08 DIAGNOSIS — N2581 Secondary hyperparathyroidism of renal origin: Secondary | ICD-10-CM | POA: Diagnosis not present

## 2018-07-08 DIAGNOSIS — D689 Coagulation defect, unspecified: Secondary | ICD-10-CM | POA: Diagnosis not present

## 2018-07-08 DIAGNOSIS — N186 End stage renal disease: Secondary | ICD-10-CM | POA: Diagnosis not present

## 2018-07-08 DIAGNOSIS — E1122 Type 2 diabetes mellitus with diabetic chronic kidney disease: Secondary | ICD-10-CM | POA: Diagnosis not present

## 2018-07-10 DIAGNOSIS — D631 Anemia in chronic kidney disease: Secondary | ICD-10-CM | POA: Diagnosis not present

## 2018-07-10 DIAGNOSIS — D689 Coagulation defect, unspecified: Secondary | ICD-10-CM | POA: Diagnosis not present

## 2018-07-10 DIAGNOSIS — E1122 Type 2 diabetes mellitus with diabetic chronic kidney disease: Secondary | ICD-10-CM | POA: Diagnosis not present

## 2018-07-10 DIAGNOSIS — N2581 Secondary hyperparathyroidism of renal origin: Secondary | ICD-10-CM | POA: Diagnosis not present

## 2018-07-10 DIAGNOSIS — N186 End stage renal disease: Secondary | ICD-10-CM | POA: Diagnosis not present

## 2018-07-10 DIAGNOSIS — R197 Diarrhea, unspecified: Secondary | ICD-10-CM | POA: Diagnosis not present

## 2018-07-10 DIAGNOSIS — E039 Hypothyroidism, unspecified: Secondary | ICD-10-CM | POA: Diagnosis not present

## 2018-07-13 DIAGNOSIS — D631 Anemia in chronic kidney disease: Secondary | ICD-10-CM | POA: Diagnosis not present

## 2018-07-13 DIAGNOSIS — E039 Hypothyroidism, unspecified: Secondary | ICD-10-CM | POA: Diagnosis not present

## 2018-07-13 DIAGNOSIS — D689 Coagulation defect, unspecified: Secondary | ICD-10-CM | POA: Diagnosis not present

## 2018-07-13 DIAGNOSIS — R197 Diarrhea, unspecified: Secondary | ICD-10-CM | POA: Diagnosis not present

## 2018-07-13 DIAGNOSIS — N186 End stage renal disease: Secondary | ICD-10-CM | POA: Diagnosis not present

## 2018-07-13 DIAGNOSIS — N2581 Secondary hyperparathyroidism of renal origin: Secondary | ICD-10-CM | POA: Diagnosis not present

## 2018-07-13 DIAGNOSIS — E1122 Type 2 diabetes mellitus with diabetic chronic kidney disease: Secondary | ICD-10-CM | POA: Diagnosis not present

## 2018-07-15 DIAGNOSIS — N186 End stage renal disease: Secondary | ICD-10-CM | POA: Diagnosis not present

## 2018-07-15 DIAGNOSIS — E039 Hypothyroidism, unspecified: Secondary | ICD-10-CM | POA: Diagnosis not present

## 2018-07-15 DIAGNOSIS — E1122 Type 2 diabetes mellitus with diabetic chronic kidney disease: Secondary | ICD-10-CM | POA: Diagnosis not present

## 2018-07-15 DIAGNOSIS — D689 Coagulation defect, unspecified: Secondary | ICD-10-CM | POA: Diagnosis not present

## 2018-07-15 DIAGNOSIS — D631 Anemia in chronic kidney disease: Secondary | ICD-10-CM | POA: Diagnosis not present

## 2018-07-15 DIAGNOSIS — N2581 Secondary hyperparathyroidism of renal origin: Secondary | ICD-10-CM | POA: Diagnosis not present

## 2018-07-15 DIAGNOSIS — R197 Diarrhea, unspecified: Secondary | ICD-10-CM | POA: Diagnosis not present

## 2018-07-17 DIAGNOSIS — D631 Anemia in chronic kidney disease: Secondary | ICD-10-CM | POA: Diagnosis not present

## 2018-07-17 DIAGNOSIS — R197 Diarrhea, unspecified: Secondary | ICD-10-CM | POA: Diagnosis not present

## 2018-07-17 DIAGNOSIS — D689 Coagulation defect, unspecified: Secondary | ICD-10-CM | POA: Diagnosis not present

## 2018-07-17 DIAGNOSIS — E1122 Type 2 diabetes mellitus with diabetic chronic kidney disease: Secondary | ICD-10-CM | POA: Diagnosis not present

## 2018-07-17 DIAGNOSIS — N186 End stage renal disease: Secondary | ICD-10-CM | POA: Diagnosis not present

## 2018-07-17 DIAGNOSIS — N2581 Secondary hyperparathyroidism of renal origin: Secondary | ICD-10-CM | POA: Diagnosis not present

## 2018-07-17 DIAGNOSIS — E039 Hypothyroidism, unspecified: Secondary | ICD-10-CM | POA: Diagnosis not present

## 2018-07-20 DIAGNOSIS — E039 Hypothyroidism, unspecified: Secondary | ICD-10-CM | POA: Diagnosis not present

## 2018-07-20 DIAGNOSIS — N186 End stage renal disease: Secondary | ICD-10-CM | POA: Diagnosis not present

## 2018-07-20 DIAGNOSIS — D631 Anemia in chronic kidney disease: Secondary | ICD-10-CM | POA: Diagnosis not present

## 2018-07-20 DIAGNOSIS — N2581 Secondary hyperparathyroidism of renal origin: Secondary | ICD-10-CM | POA: Diagnosis not present

## 2018-07-20 DIAGNOSIS — R197 Diarrhea, unspecified: Secondary | ICD-10-CM | POA: Diagnosis not present

## 2018-07-20 DIAGNOSIS — D689 Coagulation defect, unspecified: Secondary | ICD-10-CM | POA: Diagnosis not present

## 2018-07-20 DIAGNOSIS — E1122 Type 2 diabetes mellitus with diabetic chronic kidney disease: Secondary | ICD-10-CM | POA: Diagnosis not present

## 2018-07-22 DIAGNOSIS — N186 End stage renal disease: Secondary | ICD-10-CM | POA: Diagnosis not present

## 2018-07-22 DIAGNOSIS — D689 Coagulation defect, unspecified: Secondary | ICD-10-CM | POA: Diagnosis not present

## 2018-07-22 DIAGNOSIS — E1122 Type 2 diabetes mellitus with diabetic chronic kidney disease: Secondary | ICD-10-CM | POA: Diagnosis not present

## 2018-07-22 DIAGNOSIS — R197 Diarrhea, unspecified: Secondary | ICD-10-CM | POA: Diagnosis not present

## 2018-07-22 DIAGNOSIS — N2581 Secondary hyperparathyroidism of renal origin: Secondary | ICD-10-CM | POA: Diagnosis not present

## 2018-07-22 DIAGNOSIS — E039 Hypothyroidism, unspecified: Secondary | ICD-10-CM | POA: Diagnosis not present

## 2018-07-22 DIAGNOSIS — D631 Anemia in chronic kidney disease: Secondary | ICD-10-CM | POA: Diagnosis not present

## 2018-07-24 DIAGNOSIS — D689 Coagulation defect, unspecified: Secondary | ICD-10-CM | POA: Diagnosis not present

## 2018-07-24 DIAGNOSIS — E039 Hypothyroidism, unspecified: Secondary | ICD-10-CM | POA: Diagnosis not present

## 2018-07-24 DIAGNOSIS — E1122 Type 2 diabetes mellitus with diabetic chronic kidney disease: Secondary | ICD-10-CM | POA: Diagnosis not present

## 2018-07-24 DIAGNOSIS — R197 Diarrhea, unspecified: Secondary | ICD-10-CM | POA: Diagnosis not present

## 2018-07-24 DIAGNOSIS — D631 Anemia in chronic kidney disease: Secondary | ICD-10-CM | POA: Diagnosis not present

## 2018-07-24 DIAGNOSIS — N2581 Secondary hyperparathyroidism of renal origin: Secondary | ICD-10-CM | POA: Diagnosis not present

## 2018-07-24 DIAGNOSIS — N186 End stage renal disease: Secondary | ICD-10-CM | POA: Diagnosis not present

## 2018-07-27 DIAGNOSIS — E1122 Type 2 diabetes mellitus with diabetic chronic kidney disease: Secondary | ICD-10-CM | POA: Diagnosis not present

## 2018-07-27 DIAGNOSIS — D631 Anemia in chronic kidney disease: Secondary | ICD-10-CM | POA: Diagnosis not present

## 2018-07-27 DIAGNOSIS — N2581 Secondary hyperparathyroidism of renal origin: Secondary | ICD-10-CM | POA: Diagnosis not present

## 2018-07-27 DIAGNOSIS — E039 Hypothyroidism, unspecified: Secondary | ICD-10-CM | POA: Diagnosis not present

## 2018-07-27 DIAGNOSIS — R197 Diarrhea, unspecified: Secondary | ICD-10-CM | POA: Diagnosis not present

## 2018-07-27 DIAGNOSIS — D689 Coagulation defect, unspecified: Secondary | ICD-10-CM | POA: Diagnosis not present

## 2018-07-27 DIAGNOSIS — N186 End stage renal disease: Secondary | ICD-10-CM | POA: Diagnosis not present

## 2018-07-29 DIAGNOSIS — R197 Diarrhea, unspecified: Secondary | ICD-10-CM | POA: Diagnosis not present

## 2018-07-29 DIAGNOSIS — N186 End stage renal disease: Secondary | ICD-10-CM | POA: Diagnosis not present

## 2018-07-29 DIAGNOSIS — N2581 Secondary hyperparathyroidism of renal origin: Secondary | ICD-10-CM | POA: Diagnosis not present

## 2018-07-29 DIAGNOSIS — D631 Anemia in chronic kidney disease: Secondary | ICD-10-CM | POA: Diagnosis not present

## 2018-07-29 DIAGNOSIS — D689 Coagulation defect, unspecified: Secondary | ICD-10-CM | POA: Diagnosis not present

## 2018-07-29 DIAGNOSIS — E039 Hypothyroidism, unspecified: Secondary | ICD-10-CM | POA: Diagnosis not present

## 2018-07-29 DIAGNOSIS — E1122 Type 2 diabetes mellitus with diabetic chronic kidney disease: Secondary | ICD-10-CM | POA: Diagnosis not present

## 2018-07-30 DIAGNOSIS — Z992 Dependence on renal dialysis: Secondary | ICD-10-CM | POA: Diagnosis not present

## 2018-07-30 DIAGNOSIS — N186 End stage renal disease: Secondary | ICD-10-CM | POA: Diagnosis not present

## 2018-07-30 DIAGNOSIS — E1122 Type 2 diabetes mellitus with diabetic chronic kidney disease: Secondary | ICD-10-CM | POA: Diagnosis not present

## 2018-07-31 DIAGNOSIS — D689 Coagulation defect, unspecified: Secondary | ICD-10-CM | POA: Diagnosis not present

## 2018-07-31 DIAGNOSIS — N2581 Secondary hyperparathyroidism of renal origin: Secondary | ICD-10-CM | POA: Diagnosis not present

## 2018-07-31 DIAGNOSIS — E039 Hypothyroidism, unspecified: Secondary | ICD-10-CM | POA: Diagnosis not present

## 2018-07-31 DIAGNOSIS — R52 Pain, unspecified: Secondary | ICD-10-CM | POA: Diagnosis not present

## 2018-07-31 DIAGNOSIS — E1122 Type 2 diabetes mellitus with diabetic chronic kidney disease: Secondary | ICD-10-CM | POA: Diagnosis not present

## 2018-07-31 DIAGNOSIS — R509 Fever, unspecified: Secondary | ICD-10-CM | POA: Diagnosis not present

## 2018-07-31 DIAGNOSIS — N186 End stage renal disease: Secondary | ICD-10-CM | POA: Diagnosis not present

## 2018-07-31 DIAGNOSIS — D631 Anemia in chronic kidney disease: Secondary | ICD-10-CM | POA: Diagnosis not present

## 2018-08-03 DIAGNOSIS — N2581 Secondary hyperparathyroidism of renal origin: Secondary | ICD-10-CM | POA: Diagnosis not present

## 2018-08-03 DIAGNOSIS — N186 End stage renal disease: Secondary | ICD-10-CM | POA: Diagnosis not present

## 2018-08-03 DIAGNOSIS — E1122 Type 2 diabetes mellitus with diabetic chronic kidney disease: Secondary | ICD-10-CM | POA: Diagnosis not present

## 2018-08-03 DIAGNOSIS — D631 Anemia in chronic kidney disease: Secondary | ICD-10-CM | POA: Diagnosis not present

## 2018-08-03 DIAGNOSIS — E039 Hypothyroidism, unspecified: Secondary | ICD-10-CM | POA: Diagnosis not present

## 2018-08-03 DIAGNOSIS — R509 Fever, unspecified: Secondary | ICD-10-CM | POA: Diagnosis not present

## 2018-08-03 DIAGNOSIS — D689 Coagulation defect, unspecified: Secondary | ICD-10-CM | POA: Diagnosis not present

## 2018-08-03 DIAGNOSIS — R52 Pain, unspecified: Secondary | ICD-10-CM | POA: Diagnosis not present

## 2018-08-05 DIAGNOSIS — D689 Coagulation defect, unspecified: Secondary | ICD-10-CM | POA: Diagnosis not present

## 2018-08-05 DIAGNOSIS — N2581 Secondary hyperparathyroidism of renal origin: Secondary | ICD-10-CM | POA: Diagnosis not present

## 2018-08-05 DIAGNOSIS — R509 Fever, unspecified: Secondary | ICD-10-CM | POA: Diagnosis not present

## 2018-08-05 DIAGNOSIS — E1122 Type 2 diabetes mellitus with diabetic chronic kidney disease: Secondary | ICD-10-CM | POA: Diagnosis not present

## 2018-08-05 DIAGNOSIS — N186 End stage renal disease: Secondary | ICD-10-CM | POA: Diagnosis not present

## 2018-08-05 DIAGNOSIS — R52 Pain, unspecified: Secondary | ICD-10-CM | POA: Diagnosis not present

## 2018-08-05 DIAGNOSIS — D631 Anemia in chronic kidney disease: Secondary | ICD-10-CM | POA: Diagnosis not present

## 2018-08-05 DIAGNOSIS — E039 Hypothyroidism, unspecified: Secondary | ICD-10-CM | POA: Diagnosis not present

## 2018-08-07 DIAGNOSIS — R509 Fever, unspecified: Secondary | ICD-10-CM | POA: Diagnosis not present

## 2018-08-07 DIAGNOSIS — E039 Hypothyroidism, unspecified: Secondary | ICD-10-CM | POA: Diagnosis not present

## 2018-08-07 DIAGNOSIS — N186 End stage renal disease: Secondary | ICD-10-CM | POA: Diagnosis not present

## 2018-08-07 DIAGNOSIS — E1122 Type 2 diabetes mellitus with diabetic chronic kidney disease: Secondary | ICD-10-CM | POA: Diagnosis not present

## 2018-08-07 DIAGNOSIS — N2581 Secondary hyperparathyroidism of renal origin: Secondary | ICD-10-CM | POA: Diagnosis not present

## 2018-08-07 DIAGNOSIS — D631 Anemia in chronic kidney disease: Secondary | ICD-10-CM | POA: Diagnosis not present

## 2018-08-07 DIAGNOSIS — R52 Pain, unspecified: Secondary | ICD-10-CM | POA: Diagnosis not present

## 2018-08-07 DIAGNOSIS — D689 Coagulation defect, unspecified: Secondary | ICD-10-CM | POA: Diagnosis not present

## 2018-08-10 DIAGNOSIS — E1122 Type 2 diabetes mellitus with diabetic chronic kidney disease: Secondary | ICD-10-CM | POA: Diagnosis not present

## 2018-08-10 DIAGNOSIS — D689 Coagulation defect, unspecified: Secondary | ICD-10-CM | POA: Diagnosis not present

## 2018-08-10 DIAGNOSIS — E1165 Type 2 diabetes mellitus with hyperglycemia: Secondary | ICD-10-CM | POA: Diagnosis not present

## 2018-08-10 DIAGNOSIS — E039 Hypothyroidism, unspecified: Secondary | ICD-10-CM | POA: Diagnosis not present

## 2018-08-10 DIAGNOSIS — N186 End stage renal disease: Secondary | ICD-10-CM | POA: Diagnosis not present

## 2018-08-10 DIAGNOSIS — R05 Cough: Secondary | ICD-10-CM | POA: Diagnosis not present

## 2018-08-10 DIAGNOSIS — R52 Pain, unspecified: Secondary | ICD-10-CM | POA: Diagnosis not present

## 2018-08-10 DIAGNOSIS — D631 Anemia in chronic kidney disease: Secondary | ICD-10-CM | POA: Diagnosis not present

## 2018-08-10 DIAGNOSIS — N2581 Secondary hyperparathyroidism of renal origin: Secondary | ICD-10-CM | POA: Diagnosis not present

## 2018-08-10 DIAGNOSIS — R509 Fever, unspecified: Secondary | ICD-10-CM | POA: Diagnosis not present

## 2018-08-12 DIAGNOSIS — N186 End stage renal disease: Secondary | ICD-10-CM | POA: Diagnosis not present

## 2018-08-12 DIAGNOSIS — R52 Pain, unspecified: Secondary | ICD-10-CM | POA: Diagnosis not present

## 2018-08-12 DIAGNOSIS — R509 Fever, unspecified: Secondary | ICD-10-CM | POA: Diagnosis not present

## 2018-08-12 DIAGNOSIS — N2581 Secondary hyperparathyroidism of renal origin: Secondary | ICD-10-CM | POA: Diagnosis not present

## 2018-08-12 DIAGNOSIS — D631 Anemia in chronic kidney disease: Secondary | ICD-10-CM | POA: Diagnosis not present

## 2018-08-12 DIAGNOSIS — E039 Hypothyroidism, unspecified: Secondary | ICD-10-CM | POA: Diagnosis not present

## 2018-08-12 DIAGNOSIS — D689 Coagulation defect, unspecified: Secondary | ICD-10-CM | POA: Diagnosis not present

## 2018-08-12 DIAGNOSIS — E1122 Type 2 diabetes mellitus with diabetic chronic kidney disease: Secondary | ICD-10-CM | POA: Diagnosis not present

## 2018-08-14 DIAGNOSIS — R52 Pain, unspecified: Secondary | ICD-10-CM | POA: Diagnosis not present

## 2018-08-14 DIAGNOSIS — R509 Fever, unspecified: Secondary | ICD-10-CM | POA: Diagnosis not present

## 2018-08-14 DIAGNOSIS — D631 Anemia in chronic kidney disease: Secondary | ICD-10-CM | POA: Diagnosis not present

## 2018-08-14 DIAGNOSIS — N186 End stage renal disease: Secondary | ICD-10-CM | POA: Diagnosis not present

## 2018-08-14 DIAGNOSIS — N2581 Secondary hyperparathyroidism of renal origin: Secondary | ICD-10-CM | POA: Diagnosis not present

## 2018-08-14 DIAGNOSIS — D689 Coagulation defect, unspecified: Secondary | ICD-10-CM | POA: Diagnosis not present

## 2018-08-14 DIAGNOSIS — E1122 Type 2 diabetes mellitus with diabetic chronic kidney disease: Secondary | ICD-10-CM | POA: Diagnosis not present

## 2018-08-14 DIAGNOSIS — E039 Hypothyroidism, unspecified: Secondary | ICD-10-CM | POA: Diagnosis not present

## 2018-08-17 DIAGNOSIS — N2581 Secondary hyperparathyroidism of renal origin: Secondary | ICD-10-CM | POA: Diagnosis not present

## 2018-08-17 DIAGNOSIS — E039 Hypothyroidism, unspecified: Secondary | ICD-10-CM | POA: Diagnosis not present

## 2018-08-17 DIAGNOSIS — N186 End stage renal disease: Secondary | ICD-10-CM | POA: Diagnosis not present

## 2018-08-17 DIAGNOSIS — E1122 Type 2 diabetes mellitus with diabetic chronic kidney disease: Secondary | ICD-10-CM | POA: Diagnosis not present

## 2018-08-17 DIAGNOSIS — R52 Pain, unspecified: Secondary | ICD-10-CM | POA: Diagnosis not present

## 2018-08-17 DIAGNOSIS — R509 Fever, unspecified: Secondary | ICD-10-CM | POA: Diagnosis not present

## 2018-08-17 DIAGNOSIS — D689 Coagulation defect, unspecified: Secondary | ICD-10-CM | POA: Diagnosis not present

## 2018-08-17 DIAGNOSIS — D631 Anemia in chronic kidney disease: Secondary | ICD-10-CM | POA: Diagnosis not present

## 2018-08-18 ENCOUNTER — Encounter: Admit: 2018-08-18 | Discharge: 2018-08-18 | Payer: MEDICARE

## 2018-08-18 ENCOUNTER — Telehealth: Admit: 2018-08-18 | Discharge: 2018-08-18 | Payer: MEDICARE

## 2018-08-18 ENCOUNTER — Encounter: Admit: 2018-08-18 | Discharge: 2018-08-18 | Payer: MEDICARE | Attending: Nephrology | Primary: Nephrology

## 2018-08-18 DIAGNOSIS — E119 Type 2 diabetes mellitus without complications: Secondary | ICD-10-CM

## 2018-08-18 DIAGNOSIS — Z794 Long term (current) use of insulin: Secondary | ICD-10-CM

## 2018-08-18 DIAGNOSIS — Z01818 Encounter for other preprocedural examination: Principal | ICD-10-CM

## 2018-08-18 DIAGNOSIS — Z992 Dependence on renal dialysis: Secondary | ICD-10-CM

## 2018-08-18 DIAGNOSIS — N186 End stage renal disease: Secondary | ICD-10-CM

## 2018-08-19 DIAGNOSIS — D631 Anemia in chronic kidney disease: Secondary | ICD-10-CM | POA: Diagnosis not present

## 2018-08-19 DIAGNOSIS — R509 Fever, unspecified: Secondary | ICD-10-CM | POA: Diagnosis not present

## 2018-08-19 DIAGNOSIS — E039 Hypothyroidism, unspecified: Secondary | ICD-10-CM | POA: Diagnosis not present

## 2018-08-19 DIAGNOSIS — E1122 Type 2 diabetes mellitus with diabetic chronic kidney disease: Secondary | ICD-10-CM | POA: Diagnosis not present

## 2018-08-19 DIAGNOSIS — N2581 Secondary hyperparathyroidism of renal origin: Secondary | ICD-10-CM | POA: Diagnosis not present

## 2018-08-19 DIAGNOSIS — N186 End stage renal disease: Secondary | ICD-10-CM | POA: Diagnosis not present

## 2018-08-19 DIAGNOSIS — D689 Coagulation defect, unspecified: Secondary | ICD-10-CM | POA: Diagnosis not present

## 2018-08-19 DIAGNOSIS — R52 Pain, unspecified: Secondary | ICD-10-CM | POA: Diagnosis not present

## 2018-08-21 DIAGNOSIS — E039 Hypothyroidism, unspecified: Secondary | ICD-10-CM | POA: Diagnosis not present

## 2018-08-21 DIAGNOSIS — N2581 Secondary hyperparathyroidism of renal origin: Secondary | ICD-10-CM | POA: Diagnosis not present

## 2018-08-21 DIAGNOSIS — R509 Fever, unspecified: Secondary | ICD-10-CM | POA: Diagnosis not present

## 2018-08-21 DIAGNOSIS — D631 Anemia in chronic kidney disease: Secondary | ICD-10-CM | POA: Diagnosis not present

## 2018-08-21 DIAGNOSIS — E1122 Type 2 diabetes mellitus with diabetic chronic kidney disease: Secondary | ICD-10-CM | POA: Diagnosis not present

## 2018-08-21 DIAGNOSIS — R52 Pain, unspecified: Secondary | ICD-10-CM | POA: Diagnosis not present

## 2018-08-21 DIAGNOSIS — D689 Coagulation defect, unspecified: Secondary | ICD-10-CM | POA: Diagnosis not present

## 2018-08-21 DIAGNOSIS — N186 End stage renal disease: Secondary | ICD-10-CM | POA: Diagnosis not present

## 2018-08-24 DIAGNOSIS — D631 Anemia in chronic kidney disease: Secondary | ICD-10-CM | POA: Diagnosis not present

## 2018-08-24 DIAGNOSIS — R509 Fever, unspecified: Secondary | ICD-10-CM | POA: Diagnosis not present

## 2018-08-24 DIAGNOSIS — N186 End stage renal disease: Secondary | ICD-10-CM | POA: Diagnosis not present

## 2018-08-24 DIAGNOSIS — E039 Hypothyroidism, unspecified: Secondary | ICD-10-CM | POA: Diagnosis not present

## 2018-08-24 DIAGNOSIS — N2581 Secondary hyperparathyroidism of renal origin: Secondary | ICD-10-CM | POA: Diagnosis not present

## 2018-08-24 DIAGNOSIS — D689 Coagulation defect, unspecified: Secondary | ICD-10-CM | POA: Diagnosis not present

## 2018-08-24 DIAGNOSIS — R52 Pain, unspecified: Secondary | ICD-10-CM | POA: Diagnosis not present

## 2018-08-24 DIAGNOSIS — E1122 Type 2 diabetes mellitus with diabetic chronic kidney disease: Secondary | ICD-10-CM | POA: Diagnosis not present

## 2018-08-25 DIAGNOSIS — Z0000 Encounter for general adult medical examination without abnormal findings: Secondary | ICD-10-CM | POA: Diagnosis not present

## 2018-08-25 DIAGNOSIS — N186 End stage renal disease: Secondary | ICD-10-CM | POA: Diagnosis not present

## 2018-08-26 DIAGNOSIS — D631 Anemia in chronic kidney disease: Secondary | ICD-10-CM | POA: Diagnosis not present

## 2018-08-26 DIAGNOSIS — E1122 Type 2 diabetes mellitus with diabetic chronic kidney disease: Secondary | ICD-10-CM | POA: Diagnosis not present

## 2018-08-26 DIAGNOSIS — D689 Coagulation defect, unspecified: Secondary | ICD-10-CM | POA: Diagnosis not present

## 2018-08-26 DIAGNOSIS — R52 Pain, unspecified: Secondary | ICD-10-CM | POA: Diagnosis not present

## 2018-08-26 DIAGNOSIS — R509 Fever, unspecified: Secondary | ICD-10-CM | POA: Diagnosis not present

## 2018-08-26 DIAGNOSIS — N2581 Secondary hyperparathyroidism of renal origin: Secondary | ICD-10-CM | POA: Diagnosis not present

## 2018-08-26 DIAGNOSIS — N186 End stage renal disease: Secondary | ICD-10-CM | POA: Diagnosis not present

## 2018-08-26 DIAGNOSIS — E039 Hypothyroidism, unspecified: Secondary | ICD-10-CM | POA: Diagnosis not present

## 2018-08-28 DIAGNOSIS — D631 Anemia in chronic kidney disease: Secondary | ICD-10-CM | POA: Diagnosis not present

## 2018-08-28 DIAGNOSIS — N186 End stage renal disease: Secondary | ICD-10-CM | POA: Diagnosis not present

## 2018-08-28 DIAGNOSIS — D689 Coagulation defect, unspecified: Secondary | ICD-10-CM | POA: Diagnosis not present

## 2018-08-28 DIAGNOSIS — E039 Hypothyroidism, unspecified: Secondary | ICD-10-CM | POA: Diagnosis not present

## 2018-08-28 DIAGNOSIS — E1122 Type 2 diabetes mellitus with diabetic chronic kidney disease: Secondary | ICD-10-CM | POA: Diagnosis not present

## 2018-08-28 DIAGNOSIS — R509 Fever, unspecified: Secondary | ICD-10-CM | POA: Diagnosis not present

## 2018-08-28 DIAGNOSIS — R52 Pain, unspecified: Secondary | ICD-10-CM | POA: Diagnosis not present

## 2018-08-28 DIAGNOSIS — N2581 Secondary hyperparathyroidism of renal origin: Secondary | ICD-10-CM | POA: Diagnosis not present

## 2018-08-30 DIAGNOSIS — E1122 Type 2 diabetes mellitus with diabetic chronic kidney disease: Secondary | ICD-10-CM | POA: Diagnosis not present

## 2018-08-30 DIAGNOSIS — Z992 Dependence on renal dialysis: Secondary | ICD-10-CM | POA: Diagnosis not present

## 2018-08-30 DIAGNOSIS — N186 End stage renal disease: Secondary | ICD-10-CM | POA: Diagnosis not present

## 2018-08-31 DIAGNOSIS — D689 Coagulation defect, unspecified: Secondary | ICD-10-CM | POA: Diagnosis not present

## 2018-08-31 DIAGNOSIS — E1122 Type 2 diabetes mellitus with diabetic chronic kidney disease: Secondary | ICD-10-CM | POA: Diagnosis not present

## 2018-08-31 DIAGNOSIS — N2581 Secondary hyperparathyroidism of renal origin: Secondary | ICD-10-CM | POA: Diagnosis not present

## 2018-08-31 DIAGNOSIS — D631 Anemia in chronic kidney disease: Secondary | ICD-10-CM | POA: Diagnosis not present

## 2018-08-31 DIAGNOSIS — E039 Hypothyroidism, unspecified: Secondary | ICD-10-CM | POA: Diagnosis not present

## 2018-08-31 DIAGNOSIS — R52 Pain, unspecified: Secondary | ICD-10-CM | POA: Diagnosis not present

## 2018-08-31 DIAGNOSIS — N186 End stage renal disease: Secondary | ICD-10-CM | POA: Diagnosis not present

## 2018-09-02 DIAGNOSIS — R52 Pain, unspecified: Secondary | ICD-10-CM | POA: Diagnosis not present

## 2018-09-02 DIAGNOSIS — D631 Anemia in chronic kidney disease: Secondary | ICD-10-CM | POA: Diagnosis not present

## 2018-09-02 DIAGNOSIS — N186 End stage renal disease: Secondary | ICD-10-CM | POA: Diagnosis not present

## 2018-09-02 DIAGNOSIS — N2581 Secondary hyperparathyroidism of renal origin: Secondary | ICD-10-CM | POA: Diagnosis not present

## 2018-09-02 DIAGNOSIS — E1122 Type 2 diabetes mellitus with diabetic chronic kidney disease: Secondary | ICD-10-CM | POA: Diagnosis not present

## 2018-09-02 DIAGNOSIS — E039 Hypothyroidism, unspecified: Secondary | ICD-10-CM | POA: Diagnosis not present

## 2018-09-02 DIAGNOSIS — D689 Coagulation defect, unspecified: Secondary | ICD-10-CM | POA: Diagnosis not present

## 2018-09-04 DIAGNOSIS — D631 Anemia in chronic kidney disease: Secondary | ICD-10-CM | POA: Diagnosis not present

## 2018-09-04 DIAGNOSIS — N186 End stage renal disease: Secondary | ICD-10-CM | POA: Diagnosis not present

## 2018-09-04 DIAGNOSIS — E1122 Type 2 diabetes mellitus with diabetic chronic kidney disease: Secondary | ICD-10-CM | POA: Diagnosis not present

## 2018-09-04 DIAGNOSIS — R52 Pain, unspecified: Secondary | ICD-10-CM | POA: Diagnosis not present

## 2018-09-04 DIAGNOSIS — N2581 Secondary hyperparathyroidism of renal origin: Secondary | ICD-10-CM | POA: Diagnosis not present

## 2018-09-04 DIAGNOSIS — D689 Coagulation defect, unspecified: Secondary | ICD-10-CM | POA: Diagnosis not present

## 2018-09-04 DIAGNOSIS — E039 Hypothyroidism, unspecified: Secondary | ICD-10-CM | POA: Diagnosis not present

## 2018-09-07 DIAGNOSIS — R52 Pain, unspecified: Secondary | ICD-10-CM | POA: Diagnosis not present

## 2018-09-07 DIAGNOSIS — D689 Coagulation defect, unspecified: Secondary | ICD-10-CM | POA: Diagnosis not present

## 2018-09-07 DIAGNOSIS — N2581 Secondary hyperparathyroidism of renal origin: Secondary | ICD-10-CM | POA: Diagnosis not present

## 2018-09-07 DIAGNOSIS — D631 Anemia in chronic kidney disease: Secondary | ICD-10-CM | POA: Diagnosis not present

## 2018-09-07 DIAGNOSIS — E1122 Type 2 diabetes mellitus with diabetic chronic kidney disease: Secondary | ICD-10-CM | POA: Diagnosis not present

## 2018-09-07 DIAGNOSIS — E039 Hypothyroidism, unspecified: Secondary | ICD-10-CM | POA: Diagnosis not present

## 2018-09-07 DIAGNOSIS — N186 End stage renal disease: Secondary | ICD-10-CM | POA: Diagnosis not present

## 2018-09-08 DIAGNOSIS — E113491 Type 2 diabetes mellitus with severe nonproliferative diabetic retinopathy without macular edema, right eye: Secondary | ICD-10-CM | POA: Diagnosis not present

## 2018-09-08 DIAGNOSIS — H2511 Age-related nuclear cataract, right eye: Secondary | ICD-10-CM | POA: Diagnosis not present

## 2018-09-08 DIAGNOSIS — H401131 Primary open-angle glaucoma, bilateral, mild stage: Secondary | ICD-10-CM | POA: Diagnosis not present

## 2018-09-08 DIAGNOSIS — H3562 Retinal hemorrhage, left eye: Secondary | ICD-10-CM | POA: Diagnosis not present

## 2018-09-09 DIAGNOSIS — E039 Hypothyroidism, unspecified: Secondary | ICD-10-CM | POA: Diagnosis not present

## 2018-09-09 DIAGNOSIS — D689 Coagulation defect, unspecified: Secondary | ICD-10-CM | POA: Diagnosis not present

## 2018-09-09 DIAGNOSIS — D631 Anemia in chronic kidney disease: Secondary | ICD-10-CM | POA: Diagnosis not present

## 2018-09-09 DIAGNOSIS — N186 End stage renal disease: Secondary | ICD-10-CM | POA: Diagnosis not present

## 2018-09-09 DIAGNOSIS — E1122 Type 2 diabetes mellitus with diabetic chronic kidney disease: Secondary | ICD-10-CM | POA: Diagnosis not present

## 2018-09-09 DIAGNOSIS — R52 Pain, unspecified: Secondary | ICD-10-CM | POA: Diagnosis not present

## 2018-09-09 DIAGNOSIS — N2581 Secondary hyperparathyroidism of renal origin: Secondary | ICD-10-CM | POA: Diagnosis not present

## 2018-09-11 DIAGNOSIS — D631 Anemia in chronic kidney disease: Secondary | ICD-10-CM | POA: Diagnosis not present

## 2018-09-11 DIAGNOSIS — D689 Coagulation defect, unspecified: Secondary | ICD-10-CM | POA: Diagnosis not present

## 2018-09-11 DIAGNOSIS — R52 Pain, unspecified: Secondary | ICD-10-CM | POA: Diagnosis not present

## 2018-09-11 DIAGNOSIS — N186 End stage renal disease: Secondary | ICD-10-CM | POA: Diagnosis not present

## 2018-09-11 DIAGNOSIS — E039 Hypothyroidism, unspecified: Secondary | ICD-10-CM | POA: Diagnosis not present

## 2018-09-11 DIAGNOSIS — E1122 Type 2 diabetes mellitus with diabetic chronic kidney disease: Secondary | ICD-10-CM | POA: Diagnosis not present

## 2018-09-11 DIAGNOSIS — N2581 Secondary hyperparathyroidism of renal origin: Secondary | ICD-10-CM | POA: Diagnosis not present

## 2018-09-14 DIAGNOSIS — N2581 Secondary hyperparathyroidism of renal origin: Secondary | ICD-10-CM | POA: Diagnosis not present

## 2018-09-14 DIAGNOSIS — R52 Pain, unspecified: Secondary | ICD-10-CM | POA: Diagnosis not present

## 2018-09-14 DIAGNOSIS — N186 End stage renal disease: Secondary | ICD-10-CM | POA: Diagnosis not present

## 2018-09-14 DIAGNOSIS — D631 Anemia in chronic kidney disease: Secondary | ICD-10-CM | POA: Diagnosis not present

## 2018-09-14 DIAGNOSIS — E1122 Type 2 diabetes mellitus with diabetic chronic kidney disease: Secondary | ICD-10-CM | POA: Diagnosis not present

## 2018-09-14 DIAGNOSIS — E039 Hypothyroidism, unspecified: Secondary | ICD-10-CM | POA: Diagnosis not present

## 2018-09-14 DIAGNOSIS — D689 Coagulation defect, unspecified: Secondary | ICD-10-CM | POA: Diagnosis not present

## 2018-09-16 DIAGNOSIS — N2581 Secondary hyperparathyroidism of renal origin: Secondary | ICD-10-CM | POA: Diagnosis not present

## 2018-09-16 DIAGNOSIS — R52 Pain, unspecified: Secondary | ICD-10-CM | POA: Diagnosis not present

## 2018-09-16 DIAGNOSIS — N186 End stage renal disease: Secondary | ICD-10-CM | POA: Diagnosis not present

## 2018-09-16 DIAGNOSIS — E039 Hypothyroidism, unspecified: Secondary | ICD-10-CM | POA: Diagnosis not present

## 2018-09-16 DIAGNOSIS — D631 Anemia in chronic kidney disease: Secondary | ICD-10-CM | POA: Diagnosis not present

## 2018-09-16 DIAGNOSIS — D689 Coagulation defect, unspecified: Secondary | ICD-10-CM | POA: Diagnosis not present

## 2018-09-16 DIAGNOSIS — E1122 Type 2 diabetes mellitus with diabetic chronic kidney disease: Secondary | ICD-10-CM | POA: Diagnosis not present

## 2018-09-18 DIAGNOSIS — N2581 Secondary hyperparathyroidism of renal origin: Secondary | ICD-10-CM | POA: Diagnosis not present

## 2018-09-18 DIAGNOSIS — N186 End stage renal disease: Secondary | ICD-10-CM | POA: Diagnosis not present

## 2018-09-18 DIAGNOSIS — E039 Hypothyroidism, unspecified: Secondary | ICD-10-CM | POA: Diagnosis not present

## 2018-09-18 DIAGNOSIS — R52 Pain, unspecified: Secondary | ICD-10-CM | POA: Diagnosis not present

## 2018-09-18 DIAGNOSIS — D689 Coagulation defect, unspecified: Secondary | ICD-10-CM | POA: Diagnosis not present

## 2018-09-18 DIAGNOSIS — D631 Anemia in chronic kidney disease: Secondary | ICD-10-CM | POA: Diagnosis not present

## 2018-09-18 DIAGNOSIS — E1122 Type 2 diabetes mellitus with diabetic chronic kidney disease: Secondary | ICD-10-CM | POA: Diagnosis not present

## 2018-09-21 DIAGNOSIS — N2581 Secondary hyperparathyroidism of renal origin: Secondary | ICD-10-CM | POA: Diagnosis not present

## 2018-09-21 DIAGNOSIS — E1122 Type 2 diabetes mellitus with diabetic chronic kidney disease: Secondary | ICD-10-CM | POA: Diagnosis not present

## 2018-09-21 DIAGNOSIS — D631 Anemia in chronic kidney disease: Secondary | ICD-10-CM | POA: Diagnosis not present

## 2018-09-21 DIAGNOSIS — N186 End stage renal disease: Secondary | ICD-10-CM | POA: Diagnosis not present

## 2018-09-21 DIAGNOSIS — E039 Hypothyroidism, unspecified: Secondary | ICD-10-CM | POA: Diagnosis not present

## 2018-09-21 DIAGNOSIS — R52 Pain, unspecified: Secondary | ICD-10-CM | POA: Diagnosis not present

## 2018-09-21 DIAGNOSIS — D689 Coagulation defect, unspecified: Secondary | ICD-10-CM | POA: Diagnosis not present

## 2018-09-25 DIAGNOSIS — R52 Pain, unspecified: Secondary | ICD-10-CM | POA: Diagnosis not present

## 2018-09-25 DIAGNOSIS — E039 Hypothyroidism, unspecified: Secondary | ICD-10-CM | POA: Diagnosis not present

## 2018-09-25 DIAGNOSIS — N186 End stage renal disease: Secondary | ICD-10-CM | POA: Diagnosis not present

## 2018-09-25 DIAGNOSIS — E1122 Type 2 diabetes mellitus with diabetic chronic kidney disease: Secondary | ICD-10-CM | POA: Diagnosis not present

## 2018-09-25 DIAGNOSIS — D689 Coagulation defect, unspecified: Secondary | ICD-10-CM | POA: Diagnosis not present

## 2018-09-25 DIAGNOSIS — N2581 Secondary hyperparathyroidism of renal origin: Secondary | ICD-10-CM | POA: Diagnosis not present

## 2018-09-25 DIAGNOSIS — D631 Anemia in chronic kidney disease: Secondary | ICD-10-CM | POA: Diagnosis not present

## 2018-09-28 DIAGNOSIS — D689 Coagulation defect, unspecified: Secondary | ICD-10-CM | POA: Diagnosis not present

## 2018-09-28 DIAGNOSIS — E1122 Type 2 diabetes mellitus with diabetic chronic kidney disease: Secondary | ICD-10-CM | POA: Diagnosis not present

## 2018-09-28 DIAGNOSIS — R52 Pain, unspecified: Secondary | ICD-10-CM | POA: Diagnosis not present

## 2018-09-28 DIAGNOSIS — D631 Anemia in chronic kidney disease: Secondary | ICD-10-CM | POA: Diagnosis not present

## 2018-09-28 DIAGNOSIS — E039 Hypothyroidism, unspecified: Secondary | ICD-10-CM | POA: Diagnosis not present

## 2018-09-28 DIAGNOSIS — N2581 Secondary hyperparathyroidism of renal origin: Secondary | ICD-10-CM | POA: Diagnosis not present

## 2018-09-28 DIAGNOSIS — N186 End stage renal disease: Secondary | ICD-10-CM | POA: Diagnosis not present

## 2018-09-29 DIAGNOSIS — E1122 Type 2 diabetes mellitus with diabetic chronic kidney disease: Secondary | ICD-10-CM | POA: Diagnosis not present

## 2018-09-29 DIAGNOSIS — N186 End stage renal disease: Secondary | ICD-10-CM | POA: Diagnosis not present

## 2018-09-29 DIAGNOSIS — Z992 Dependence on renal dialysis: Secondary | ICD-10-CM | POA: Diagnosis not present

## 2018-09-30 DIAGNOSIS — D631 Anemia in chronic kidney disease: Secondary | ICD-10-CM | POA: Diagnosis not present

## 2018-09-30 DIAGNOSIS — D689 Coagulation defect, unspecified: Secondary | ICD-10-CM | POA: Diagnosis not present

## 2018-09-30 DIAGNOSIS — N2581 Secondary hyperparathyroidism of renal origin: Secondary | ICD-10-CM | POA: Diagnosis not present

## 2018-09-30 DIAGNOSIS — E039 Hypothyroidism, unspecified: Secondary | ICD-10-CM | POA: Diagnosis not present

## 2018-09-30 DIAGNOSIS — R197 Diarrhea, unspecified: Secondary | ICD-10-CM | POA: Diagnosis not present

## 2018-09-30 DIAGNOSIS — E1122 Type 2 diabetes mellitus with diabetic chronic kidney disease: Secondary | ICD-10-CM | POA: Diagnosis not present

## 2018-09-30 DIAGNOSIS — R52 Pain, unspecified: Secondary | ICD-10-CM | POA: Diagnosis not present

## 2018-09-30 DIAGNOSIS — Z992 Dependence on renal dialysis: Secondary | ICD-10-CM | POA: Diagnosis not present

## 2018-09-30 DIAGNOSIS — N186 End stage renal disease: Secondary | ICD-10-CM | POA: Diagnosis not present

## 2018-10-02 DIAGNOSIS — R197 Diarrhea, unspecified: Secondary | ICD-10-CM | POA: Diagnosis not present

## 2018-10-02 DIAGNOSIS — E1122 Type 2 diabetes mellitus with diabetic chronic kidney disease: Secondary | ICD-10-CM | POA: Diagnosis not present

## 2018-10-02 DIAGNOSIS — Z992 Dependence on renal dialysis: Secondary | ICD-10-CM | POA: Diagnosis not present

## 2018-10-02 DIAGNOSIS — E039 Hypothyroidism, unspecified: Secondary | ICD-10-CM | POA: Diagnosis not present

## 2018-10-02 DIAGNOSIS — N186 End stage renal disease: Secondary | ICD-10-CM | POA: Diagnosis not present

## 2018-10-02 DIAGNOSIS — N2581 Secondary hyperparathyroidism of renal origin: Secondary | ICD-10-CM | POA: Diagnosis not present

## 2018-10-02 DIAGNOSIS — D631 Anemia in chronic kidney disease: Secondary | ICD-10-CM | POA: Diagnosis not present

## 2018-10-02 DIAGNOSIS — D689 Coagulation defect, unspecified: Secondary | ICD-10-CM | POA: Diagnosis not present

## 2018-10-02 DIAGNOSIS — R52 Pain, unspecified: Secondary | ICD-10-CM | POA: Diagnosis not present

## 2018-10-05 DIAGNOSIS — D689 Coagulation defect, unspecified: Secondary | ICD-10-CM | POA: Diagnosis not present

## 2018-10-05 DIAGNOSIS — E039 Hypothyroidism, unspecified: Secondary | ICD-10-CM | POA: Diagnosis not present

## 2018-10-05 DIAGNOSIS — N2581 Secondary hyperparathyroidism of renal origin: Secondary | ICD-10-CM | POA: Diagnosis not present

## 2018-10-05 DIAGNOSIS — R197 Diarrhea, unspecified: Secondary | ICD-10-CM | POA: Diagnosis not present

## 2018-10-05 DIAGNOSIS — D631 Anemia in chronic kidney disease: Secondary | ICD-10-CM | POA: Diagnosis not present

## 2018-10-05 DIAGNOSIS — R52 Pain, unspecified: Secondary | ICD-10-CM | POA: Diagnosis not present

## 2018-10-05 DIAGNOSIS — Z992 Dependence on renal dialysis: Secondary | ICD-10-CM | POA: Diagnosis not present

## 2018-10-05 DIAGNOSIS — E1122 Type 2 diabetes mellitus with diabetic chronic kidney disease: Secondary | ICD-10-CM | POA: Diagnosis not present

## 2018-10-05 DIAGNOSIS — N186 End stage renal disease: Secondary | ICD-10-CM | POA: Diagnosis not present

## 2018-10-07 ENCOUNTER — Other Ambulatory Visit: Payer: Self-pay

## 2018-10-07 ENCOUNTER — Ambulatory Visit (INDEPENDENT_AMBULATORY_CARE_PROVIDER_SITE_OTHER): Payer: Medicare Other | Admitting: Family

## 2018-10-07 DIAGNOSIS — R52 Pain, unspecified: Secondary | ICD-10-CM | POA: Diagnosis not present

## 2018-10-07 DIAGNOSIS — N2581 Secondary hyperparathyroidism of renal origin: Secondary | ICD-10-CM | POA: Diagnosis not present

## 2018-10-07 DIAGNOSIS — E1122 Type 2 diabetes mellitus with diabetic chronic kidney disease: Secondary | ICD-10-CM | POA: Diagnosis not present

## 2018-10-07 DIAGNOSIS — N186 End stage renal disease: Secondary | ICD-10-CM | POA: Diagnosis not present

## 2018-10-07 DIAGNOSIS — R197 Diarrhea, unspecified: Secondary | ICD-10-CM | POA: Diagnosis not present

## 2018-10-07 DIAGNOSIS — D689 Coagulation defect, unspecified: Secondary | ICD-10-CM | POA: Diagnosis not present

## 2018-10-07 DIAGNOSIS — D631 Anemia in chronic kidney disease: Secondary | ICD-10-CM | POA: Diagnosis not present

## 2018-10-07 DIAGNOSIS — Z992 Dependence on renal dialysis: Secondary | ICD-10-CM | POA: Diagnosis not present

## 2018-10-07 DIAGNOSIS — M79604 Pain in right leg: Secondary | ICD-10-CM

## 2018-10-07 DIAGNOSIS — E039 Hypothyroidism, unspecified: Secondary | ICD-10-CM | POA: Diagnosis not present

## 2018-10-09 DIAGNOSIS — N2581 Secondary hyperparathyroidism of renal origin: Secondary | ICD-10-CM | POA: Diagnosis not present

## 2018-10-09 DIAGNOSIS — R52 Pain, unspecified: Secondary | ICD-10-CM | POA: Diagnosis not present

## 2018-10-09 DIAGNOSIS — Z992 Dependence on renal dialysis: Secondary | ICD-10-CM | POA: Diagnosis not present

## 2018-10-09 DIAGNOSIS — D631 Anemia in chronic kidney disease: Secondary | ICD-10-CM | POA: Diagnosis not present

## 2018-10-09 DIAGNOSIS — E039 Hypothyroidism, unspecified: Secondary | ICD-10-CM | POA: Diagnosis not present

## 2018-10-09 DIAGNOSIS — R197 Diarrhea, unspecified: Secondary | ICD-10-CM | POA: Diagnosis not present

## 2018-10-09 DIAGNOSIS — N186 End stage renal disease: Secondary | ICD-10-CM | POA: Diagnosis not present

## 2018-10-09 DIAGNOSIS — D689 Coagulation defect, unspecified: Secondary | ICD-10-CM | POA: Diagnosis not present

## 2018-10-09 DIAGNOSIS — E1122 Type 2 diabetes mellitus with diabetic chronic kidney disease: Secondary | ICD-10-CM | POA: Diagnosis not present

## 2018-10-12 DIAGNOSIS — D689 Coagulation defect, unspecified: Secondary | ICD-10-CM | POA: Diagnosis not present

## 2018-10-12 DIAGNOSIS — E1122 Type 2 diabetes mellitus with diabetic chronic kidney disease: Secondary | ICD-10-CM | POA: Diagnosis not present

## 2018-10-12 DIAGNOSIS — N186 End stage renal disease: Secondary | ICD-10-CM | POA: Diagnosis not present

## 2018-10-12 DIAGNOSIS — R52 Pain, unspecified: Secondary | ICD-10-CM | POA: Diagnosis not present

## 2018-10-12 DIAGNOSIS — N2581 Secondary hyperparathyroidism of renal origin: Secondary | ICD-10-CM | POA: Diagnosis not present

## 2018-10-12 DIAGNOSIS — D631 Anemia in chronic kidney disease: Secondary | ICD-10-CM | POA: Diagnosis not present

## 2018-10-12 DIAGNOSIS — R197 Diarrhea, unspecified: Secondary | ICD-10-CM | POA: Diagnosis not present

## 2018-10-12 DIAGNOSIS — E039 Hypothyroidism, unspecified: Secondary | ICD-10-CM | POA: Diagnosis not present

## 2018-10-12 DIAGNOSIS — Z992 Dependence on renal dialysis: Secondary | ICD-10-CM | POA: Diagnosis not present

## 2018-10-14 DIAGNOSIS — D631 Anemia in chronic kidney disease: Secondary | ICD-10-CM | POA: Diagnosis not present

## 2018-10-14 DIAGNOSIS — E039 Hypothyroidism, unspecified: Secondary | ICD-10-CM | POA: Diagnosis not present

## 2018-10-14 DIAGNOSIS — Z992 Dependence on renal dialysis: Secondary | ICD-10-CM | POA: Diagnosis not present

## 2018-10-14 DIAGNOSIS — E1122 Type 2 diabetes mellitus with diabetic chronic kidney disease: Secondary | ICD-10-CM | POA: Diagnosis not present

## 2018-10-14 DIAGNOSIS — N2581 Secondary hyperparathyroidism of renal origin: Secondary | ICD-10-CM | POA: Diagnosis not present

## 2018-10-14 DIAGNOSIS — R52 Pain, unspecified: Secondary | ICD-10-CM | POA: Diagnosis not present

## 2018-10-14 DIAGNOSIS — R197 Diarrhea, unspecified: Secondary | ICD-10-CM | POA: Diagnosis not present

## 2018-10-14 DIAGNOSIS — N186 End stage renal disease: Secondary | ICD-10-CM | POA: Diagnosis not present

## 2018-10-14 DIAGNOSIS — D689 Coagulation defect, unspecified: Secondary | ICD-10-CM | POA: Diagnosis not present

## 2018-10-16 DIAGNOSIS — E1122 Type 2 diabetes mellitus with diabetic chronic kidney disease: Secondary | ICD-10-CM | POA: Diagnosis not present

## 2018-10-16 DIAGNOSIS — N2581 Secondary hyperparathyroidism of renal origin: Secondary | ICD-10-CM | POA: Diagnosis not present

## 2018-10-16 DIAGNOSIS — R52 Pain, unspecified: Secondary | ICD-10-CM | POA: Diagnosis not present

## 2018-10-16 DIAGNOSIS — E039 Hypothyroidism, unspecified: Secondary | ICD-10-CM | POA: Diagnosis not present

## 2018-10-16 DIAGNOSIS — D689 Coagulation defect, unspecified: Secondary | ICD-10-CM | POA: Diagnosis not present

## 2018-10-16 DIAGNOSIS — R197 Diarrhea, unspecified: Secondary | ICD-10-CM | POA: Diagnosis not present

## 2018-10-16 DIAGNOSIS — Z992 Dependence on renal dialysis: Secondary | ICD-10-CM | POA: Diagnosis not present

## 2018-10-16 DIAGNOSIS — D631 Anemia in chronic kidney disease: Secondary | ICD-10-CM | POA: Diagnosis not present

## 2018-10-16 DIAGNOSIS — N186 End stage renal disease: Secondary | ICD-10-CM | POA: Diagnosis not present

## 2018-10-19 DIAGNOSIS — E1122 Type 2 diabetes mellitus with diabetic chronic kidney disease: Secondary | ICD-10-CM | POA: Diagnosis not present

## 2018-10-19 DIAGNOSIS — D689 Coagulation defect, unspecified: Secondary | ICD-10-CM | POA: Diagnosis not present

## 2018-10-19 DIAGNOSIS — E039 Hypothyroidism, unspecified: Secondary | ICD-10-CM | POA: Diagnosis not present

## 2018-10-19 DIAGNOSIS — D631 Anemia in chronic kidney disease: Secondary | ICD-10-CM | POA: Diagnosis not present

## 2018-10-19 DIAGNOSIS — N2581 Secondary hyperparathyroidism of renal origin: Secondary | ICD-10-CM | POA: Diagnosis not present

## 2018-10-19 DIAGNOSIS — N186 End stage renal disease: Secondary | ICD-10-CM | POA: Diagnosis not present

## 2018-10-19 DIAGNOSIS — Z992 Dependence on renal dialysis: Secondary | ICD-10-CM | POA: Diagnosis not present

## 2018-10-19 DIAGNOSIS — R52 Pain, unspecified: Secondary | ICD-10-CM | POA: Diagnosis not present

## 2018-10-19 DIAGNOSIS — R197 Diarrhea, unspecified: Secondary | ICD-10-CM | POA: Diagnosis not present

## 2018-10-21 DIAGNOSIS — E1122 Type 2 diabetes mellitus with diabetic chronic kidney disease: Secondary | ICD-10-CM | POA: Diagnosis not present

## 2018-10-21 DIAGNOSIS — D631 Anemia in chronic kidney disease: Secondary | ICD-10-CM | POA: Diagnosis not present

## 2018-10-21 DIAGNOSIS — N186 End stage renal disease: Secondary | ICD-10-CM | POA: Diagnosis not present

## 2018-10-21 DIAGNOSIS — D689 Coagulation defect, unspecified: Secondary | ICD-10-CM | POA: Diagnosis not present

## 2018-10-21 DIAGNOSIS — N2581 Secondary hyperparathyroidism of renal origin: Secondary | ICD-10-CM | POA: Diagnosis not present

## 2018-10-21 DIAGNOSIS — Z992 Dependence on renal dialysis: Secondary | ICD-10-CM | POA: Diagnosis not present

## 2018-10-21 DIAGNOSIS — R52 Pain, unspecified: Secondary | ICD-10-CM | POA: Diagnosis not present

## 2018-10-21 DIAGNOSIS — E039 Hypothyroidism, unspecified: Secondary | ICD-10-CM | POA: Diagnosis not present

## 2018-10-21 DIAGNOSIS — R197 Diarrhea, unspecified: Secondary | ICD-10-CM | POA: Diagnosis not present

## 2018-10-23 DIAGNOSIS — Z992 Dependence on renal dialysis: Secondary | ICD-10-CM | POA: Diagnosis not present

## 2018-10-23 DIAGNOSIS — D689 Coagulation defect, unspecified: Secondary | ICD-10-CM | POA: Diagnosis not present

## 2018-10-23 DIAGNOSIS — E039 Hypothyroidism, unspecified: Secondary | ICD-10-CM | POA: Diagnosis not present

## 2018-10-23 DIAGNOSIS — N2581 Secondary hyperparathyroidism of renal origin: Secondary | ICD-10-CM | POA: Diagnosis not present

## 2018-10-23 DIAGNOSIS — R197 Diarrhea, unspecified: Secondary | ICD-10-CM | POA: Diagnosis not present

## 2018-10-23 DIAGNOSIS — R52 Pain, unspecified: Secondary | ICD-10-CM | POA: Diagnosis not present

## 2018-10-23 DIAGNOSIS — D631 Anemia in chronic kidney disease: Secondary | ICD-10-CM | POA: Diagnosis not present

## 2018-10-23 DIAGNOSIS — E1122 Type 2 diabetes mellitus with diabetic chronic kidney disease: Secondary | ICD-10-CM | POA: Diagnosis not present

## 2018-10-23 DIAGNOSIS — N186 End stage renal disease: Secondary | ICD-10-CM | POA: Diagnosis not present

## 2018-10-26 DIAGNOSIS — R52 Pain, unspecified: Secondary | ICD-10-CM | POA: Diagnosis not present

## 2018-10-26 DIAGNOSIS — R197 Diarrhea, unspecified: Secondary | ICD-10-CM | POA: Diagnosis not present

## 2018-10-26 DIAGNOSIS — Z992 Dependence on renal dialysis: Secondary | ICD-10-CM | POA: Diagnosis not present

## 2018-10-26 DIAGNOSIS — D689 Coagulation defect, unspecified: Secondary | ICD-10-CM | POA: Diagnosis not present

## 2018-10-26 DIAGNOSIS — E1122 Type 2 diabetes mellitus with diabetic chronic kidney disease: Secondary | ICD-10-CM | POA: Diagnosis not present

## 2018-10-26 DIAGNOSIS — N186 End stage renal disease: Secondary | ICD-10-CM | POA: Diagnosis not present

## 2018-10-26 DIAGNOSIS — E039 Hypothyroidism, unspecified: Secondary | ICD-10-CM | POA: Diagnosis not present

## 2018-10-26 DIAGNOSIS — D631 Anemia in chronic kidney disease: Secondary | ICD-10-CM | POA: Diagnosis not present

## 2018-10-26 DIAGNOSIS — N2581 Secondary hyperparathyroidism of renal origin: Secondary | ICD-10-CM | POA: Diagnosis not present

## 2018-10-28 DIAGNOSIS — D631 Anemia in chronic kidney disease: Secondary | ICD-10-CM | POA: Diagnosis not present

## 2018-10-28 DIAGNOSIS — N186 End stage renal disease: Secondary | ICD-10-CM | POA: Diagnosis not present

## 2018-10-28 DIAGNOSIS — E1122 Type 2 diabetes mellitus with diabetic chronic kidney disease: Secondary | ICD-10-CM | POA: Diagnosis not present

## 2018-10-28 DIAGNOSIS — N2581 Secondary hyperparathyroidism of renal origin: Secondary | ICD-10-CM | POA: Diagnosis not present

## 2018-10-28 DIAGNOSIS — R52 Pain, unspecified: Secondary | ICD-10-CM | POA: Diagnosis not present

## 2018-10-28 DIAGNOSIS — Z992 Dependence on renal dialysis: Secondary | ICD-10-CM | POA: Diagnosis not present

## 2018-10-28 DIAGNOSIS — R197 Diarrhea, unspecified: Secondary | ICD-10-CM | POA: Diagnosis not present

## 2018-10-28 DIAGNOSIS — E039 Hypothyroidism, unspecified: Secondary | ICD-10-CM | POA: Diagnosis not present

## 2018-10-28 DIAGNOSIS — D689 Coagulation defect, unspecified: Secondary | ICD-10-CM | POA: Diagnosis not present

## 2018-10-30 DIAGNOSIS — D631 Anemia in chronic kidney disease: Secondary | ICD-10-CM | POA: Diagnosis not present

## 2018-10-30 DIAGNOSIS — R52 Pain, unspecified: Secondary | ICD-10-CM | POA: Diagnosis not present

## 2018-10-30 DIAGNOSIS — R197 Diarrhea, unspecified: Secondary | ICD-10-CM | POA: Diagnosis not present

## 2018-10-30 DIAGNOSIS — Z992 Dependence on renal dialysis: Secondary | ICD-10-CM | POA: Diagnosis not present

## 2018-10-30 DIAGNOSIS — E039 Hypothyroidism, unspecified: Secondary | ICD-10-CM | POA: Diagnosis not present

## 2018-10-30 DIAGNOSIS — D689 Coagulation defect, unspecified: Secondary | ICD-10-CM | POA: Diagnosis not present

## 2018-10-30 DIAGNOSIS — N2581 Secondary hyperparathyroidism of renal origin: Secondary | ICD-10-CM | POA: Diagnosis not present

## 2018-10-30 DIAGNOSIS — E1122 Type 2 diabetes mellitus with diabetic chronic kidney disease: Secondary | ICD-10-CM | POA: Diagnosis not present

## 2018-10-30 DIAGNOSIS — N186 End stage renal disease: Secondary | ICD-10-CM | POA: Diagnosis not present

## 2018-11-02 DIAGNOSIS — R52 Pain, unspecified: Secondary | ICD-10-CM | POA: Diagnosis not present

## 2018-11-02 DIAGNOSIS — N2581 Secondary hyperparathyroidism of renal origin: Secondary | ICD-10-CM | POA: Diagnosis not present

## 2018-11-02 DIAGNOSIS — D631 Anemia in chronic kidney disease: Secondary | ICD-10-CM | POA: Diagnosis not present

## 2018-11-02 DIAGNOSIS — D689 Coagulation defect, unspecified: Secondary | ICD-10-CM | POA: Diagnosis not present

## 2018-11-02 DIAGNOSIS — Z992 Dependence on renal dialysis: Secondary | ICD-10-CM | POA: Diagnosis not present

## 2018-11-02 DIAGNOSIS — E039 Hypothyroidism, unspecified: Secondary | ICD-10-CM | POA: Diagnosis not present

## 2018-11-02 DIAGNOSIS — N186 End stage renal disease: Secondary | ICD-10-CM | POA: Diagnosis not present

## 2018-11-03 DIAGNOSIS — Z89511 Acquired absence of right leg below knee: Secondary | ICD-10-CM | POA: Diagnosis not present

## 2018-11-04 ENCOUNTER — Encounter: Payer: Self-pay | Admitting: Gastroenterology

## 2018-11-04 ENCOUNTER — Ambulatory Visit (INDEPENDENT_AMBULATORY_CARE_PROVIDER_SITE_OTHER): Payer: Medicare Other | Admitting: Gastroenterology

## 2018-11-04 VITALS — BP 132/62 | HR 82 | Temp 99.0°F | Ht 68.0 in | Wt 259.2 lb

## 2018-11-04 DIAGNOSIS — K625 Hemorrhage of anus and rectum: Secondary | ICD-10-CM

## 2018-11-04 DIAGNOSIS — R52 Pain, unspecified: Secondary | ICD-10-CM | POA: Diagnosis not present

## 2018-11-04 DIAGNOSIS — D631 Anemia in chronic kidney disease: Secondary | ICD-10-CM | POA: Diagnosis not present

## 2018-11-04 DIAGNOSIS — N2581 Secondary hyperparathyroidism of renal origin: Secondary | ICD-10-CM | POA: Diagnosis not present

## 2018-11-04 DIAGNOSIS — E039 Hypothyroidism, unspecified: Secondary | ICD-10-CM | POA: Diagnosis not present

## 2018-11-04 DIAGNOSIS — K6289 Other specified diseases of anus and rectum: Secondary | ICD-10-CM | POA: Diagnosis not present

## 2018-11-04 DIAGNOSIS — Z992 Dependence on renal dialysis: Secondary | ICD-10-CM | POA: Diagnosis not present

## 2018-11-04 DIAGNOSIS — N186 End stage renal disease: Secondary | ICD-10-CM | POA: Diagnosis not present

## 2018-11-04 DIAGNOSIS — K5909 Other constipation: Secondary | ICD-10-CM

## 2018-11-04 DIAGNOSIS — D689 Coagulation defect, unspecified: Secondary | ICD-10-CM | POA: Diagnosis not present

## 2018-11-04 DIAGNOSIS — K602 Anal fissure, unspecified: Secondary | ICD-10-CM

## 2018-11-04 MED ORDER — AMBULATORY NON FORMULARY MEDICATION: 1 refills | Status: DC

## 2018-11-04 NOTE — Progress Notes (Signed)
[facility] Consult Note:  History: Tim Becker 11/04/2018  Referring provider: Madelon Lips, MD ([facility])  Reason for consult/chief complaint: Hemorrhoids (Was prescribed a hemorrhoidal cream but isnt sure how to use it), Rectal Pain (Patient had a Normal colonoscopy in 2011), and Rectal Bleeding   Subjective  HPI: Referral from nephrologist regarding rectal bleeding and possible hemorrhoids. No office notes or data accompany the referral.  Telemedicine note from [facility] on 08/18/2018 give some additional history.  Patient has end-stage renal disease from type 2 diabetes and hypertension, on dialysis since June 2016.  He had a left nephrectomy for renal cell carcinoma in April 2017.  He has coronary artery disease with prior stent placement, morbid obesity and obstructive sleep apnea. The assessment was that he "may be an acceptable candidate for kidney transplantation", pending pretransplant evaluation.  Cardiac echo and stress test will needed.  Angiographic imaging of the abdomen needed presurgery.  They also wanted to be certain he was up-to-date on screening colonoscopy.   [name] reports at least a year of anorectal pain with bowel movements and tendencies to constipation.  He will have intermittent blood on the paper but no frank rectal bleeding.  He denies abdominal pain, nausea or vomiting.  He tends toward constipation, and when he took a combination stool softener and stimulant laxative it caused the stool to be too loose.  Even on daily stool softener he still sometimes strains and feels he is incompletely evacuated, a problem which has been going on for years. He was prescribed Proctosol ointment by primary care for suspected hemorrhoids, but not sure how he should use it.  He was able to obtain the report from his last colonoscopy with Dr. Gena Becker at [facility] in [place] dated 01/05/2010.   There was reportedly excellent preparation, complete exam to the cecum, and only findings of internal hemorrhoids.   ROS:  Review of Systems  Constitutional: Positive for fatigue. Negative for appetite change and unexpected weight change.  HENT: Negative for mouth sores and voice change.   Eyes: Negative for pain and redness.  Respiratory: Negative for cough and shortness of breath.   Cardiovascular: Negative for chest pain and palpitations.  Genitourinary: Negative for dysuria and hematuria.  Musculoskeletal: Negative for arthralgias and myalgias.  Skin: Negative for pallor and rash.  Neurological: Negative for weakness and headaches.  Hematological: Negative for adenopathy.     Past Medical History: Past Medical History:  Diagnosis Date  . Anemia   . Arthritis   . Cancer [facility])    Renal Tumor  . CKD (chronic kidney disease)   . Coronary artery disease   . Diabetes ([place])   . DM (diabetes mellitus) with complications ([place])   . ESRD (end stage renal disease) ([place])    Pre- dialysis  . Hypertension   . Left kidney mass   . Neuropathy   . Obesity   . Osteomyelitis, chronic, ankle or foot ([place])   . PONV (postoperative nausea and vomiting)   . Renal insufficiency    Patient is on Dialysis and receives M,W and F.  . S/P BKA (below knee amputation) [facility]) June 2016   Right , [place], [place]  . Sleep apnea    CPAP  . Thyroid disease      Past Surgical History: Past Surgical History:  Procedure Laterality Date  . BASCILIC VEIN TRANSPOSITION Left 02/17/2015   Procedure: BASCILIC VEIN TRANSPOSITION;  Surgeon: Katha Cabal,  MD;  Location: [facility] ORS;  Service: Vascular;  Laterality: Left;  . BELOW KNEE LEG AMPUTATION Right   . CORONARY ANGIOPLASTY WITH STENT PLACEMENT    . EYE SURGERY    . FOOT SURGERY     Multiple R foot surgery for infection and charcot foot  . I&D EXTREMITY Left 04/16/2016   Procedure: IRRIGATION AND DEBRIDEMENT EXTREMITY/GREAT TOE AMP.;  Surgeon:  Edrick Kins, DPM;  Location: [place];  Service: Podiatry;  Laterality: Left;  . JOINT REPLACEMENT    . LAPAROSCOPIC NEPHRECTOMY, HAND ASSISTED Left 07/11/2015   Procedure: HAND ASSISTED LAPAROSCOPIC NEPHRECTOMY;  Surgeon: Hollice Espy, MD;  Location: [facility] ORS;  Service: Urology;  Laterality: Left;  . PERIPHERAL VASCULAR CATHETERIZATION Left 12/06/2014   Procedure: A/V Shuntogram/Fistulagram;  Surgeon: Katha Cabal, MD;  Location: [place] [facility];  Service: Cardiovascular;  Laterality: Left;  . PERIPHERAL VASCULAR CATHETERIZATION Left 12/06/2014   Procedure: A/V Shunt Intervention;  Surgeon: Katha Cabal, MD;  Location: [place] [facility];  Service: Cardiovascular;  Laterality: Left;  . TONSILLECTOMY       Family History: Family History  Problem Relation Age of Onset  . Diabetes Mother   . Kidney disease Mother   . Breast cancer Mother     Social History: Social History   Socioeconomic History  . Marital status: Married    Spouse name: Not on file  . Number of children: Not on file  . Years of education: Not on file  . Highest education level: Not on file  Occupational History  . Not on file  Social Needs  . Financial resource strain: Not on file  . Food insecurity    Worry: Not on file    Inability: Not on file  . Transportation needs    Medical: Not on file    Non-medical: Not on file  Tobacco Use  . Smoking status: Never Smoker  . Smokeless tobacco: Never Used  Substance and Sexual Activity  . Alcohol use: No  . Drug use: No  . Sexual activity: Not on file  Lifestyle  . Physical activity    Days per week: Not on file    Minutes per session: Not on file  . Stress: Not on file  Relationships  . Social Herbalist on phone: Not on file    Gets together: Not on file    Attends religious service: Not on file    Active member of club or organization: Not on file    Attends meetings of clubs or organizations: Not on file    Relationship  status: Not on file  Other Topics Concern  . Not on file  Social History Narrative  . Not on file    Allergies: No Known Allergies  Outpatient Meds: Current Outpatient Medications  Medication Sig Dispense Refill  . acetaminophen (TYLENOL) 500 MG tablet Take 1,000 mg by mouth every 6 (six) hours as needed for headache (pain).    Marland Kitchen amLODipine (NORVASC) 10 MG tablet     . atorvastatin (LIPITOR) 40 MG tablet Take 1 tablet (40 mg total) by mouth daily. 90 tablet 2  . calcium acetate (PHOSLO) 667 MG capsule     . carvedilol (COREG) 12.5 MG tablet Take 12.5 mg by mouth 2 (two) times daily with a meal.    . insulin glargine (LANTUS) 100 unit/mL SOPN Inject 80 Units into the skin daily before breakfast.    . Insulin Lispro (HUMALOG KWIKPEN) 200 UNIT/ML SOPN Inject  10-20 Units into the skin See admin instructions. Inject 10-20 units (per sliding scale) two - three times daily with meals    . PRESCRIPTION MEDICATION Inhale into the lungs at bedtime. CPAP    . AMBULATORY NON FORMULARY MEDICATION Nitroglycerine ointment 0.125 %  Apply a pea sized amount internally four times daily. Dispense 30 GM 30 g 1   No current facility-administered medications for this visit.       ___________________________________________________________________ Objective   Exam:  BP 132/62   Pulse 82   Temp 99 F (37.2 C) (Oral)   Ht 5\' 8"  (1.727 m)   Wt 259 lb 3.2 oz (117.6 kg)   SpO2 96%   BMI 39.41 kg/m    General: Pleasant and conversational.  Gets on exam table without assistance.  Eyes: sclera anicteric, no redness  ENT: oral mucosa moist without lesions, no cervical or supraclavicular lymphadenopathy  CV: RRR without murmur, S1/S2, no JVD, no peripheral edema  Resp: clear to auscultation bilaterally, normal RR and effort noted  GI: soft, obese, no tenderness, with active bowel sounds. No guarding or palpable organomegaly noted, but limited by body habitus.  Skin; warm and dry, no rash or  jaundice noted  Neuro: awake, alert and oriented x 3. Normal gross motor function and fluent speech Right lower leg prosthesis  Rectal: Perianal exam reveals visible posterior anal fissure.  DRE reveals tender posterior fissure, no palpable internal lesions.  Firm stool in rectal vault.  Labs:  Colonoscopy report as noted above  Assessment: Encounter Diagnoses  Name Primary?  Marland Kitchen Anal fissure Yes  . Anal pain   . Anal bleeding     While he did have internal hemorrhoids reported on colonoscopy in 2011, his current symptoms are from an anal fissure.  It has probably been present for least a year based on his reported symptoms. It is also exacerbated by constipation.  The nature of anal fissure was reviewed, including a diagram of the anatomy, available treatments and the need to relieve constipation.  Plan:  Tablespoon of Citrucel daily in water Combination of RectiCare and 0.125% nitroglycerin ointment 3 times daily. Follow-up in clinic 6 weeks. He is up-to-date on colon cancer screening, and says he has provided that colonoscopy report to his transplant clinic.   Thank you for the courtesy of this consult.  Please call me with any questions or concerns.  Nelida Meuse III  CC: Referring provider noted above

## 2018-11-04 NOTE — Patient Instructions (Addendum)
If you are age 53 or older, your body mass index should be between 23-30. Your Body mass index is 39.41 kg/m. If this is out of the aforementioned range listed, please consider follow up with your Primary Care Provider.  If you are age 59 or younger, your body mass index should be between 19-25. Your Body mass index is 39.41 kg/m. If this is out of the aformentioned range listed, please consider follow up with your Primary Care Provider.    Patient Drug Education for Nitroglycerin Ointment   A pea-sized drop should be placed on the tip of your finger and then gently placed inside the anus. The finger should be inserted 1/3 - 1/2 its length and may be covered with a plastic glove or finger cot. You may use Vaseline to help coat the finger or dilute the ointment.  The first few applications should be taken lying down, as mild light-headedness or a brief headache may occur.  The most common side effect - a headache. It is usually brief and mild, but may require Tylenol or Advil. You may dilute the NTG further with Vaseline to decrease the headaches. As the treatment progresses and the hemorrhoid begins to heal, the headaches will tend to dissipate. Other side effects include lightheadedness, flushing, dizziness, nervousness, nausea, and vomiting. If any of these side effects persist or worsen, notify us promptly. Stop using the NTG and notify us immediately if you develop the rare side effects of severe dizziness, fainting, fast/pounding heartbeat, paleness, sweating, blurred vision, dry mouth, dark urine, bluish lips/skin/nails, unusual tiredness, severe weakness, irregular heartbeat, seizures, or chest pain. Serious allergic reactions are unusual, but seek immediate medical attention if you develop a rash, swelling, dizziness, or trouble breathing.  Tell us if you are allergic to nitrates, have severe anemia, low blood pressure, dehydration, chronic heart failure, cardiomyopathy, recent heart attack,  increased pressure in the brain, or exposure to nitrates while on the job. Do not use NTG while driving or working around machinery if you are drowsy, dizzy, have lightheadedness, or blurred vision. Limit alcoholic beverages. To minimize dizziness and lightheadedness, get up slowly when rising from a sitting or lying position. The elderly may be more prone to dizziness and falling. While there are not adequate studies to confirm the safety of NTG in pregnant or breast feeding women, it has been used without incident so far. We recommend waiting at least one hour after applying the NTG ointment before breast feeding.  Do not use NTG ointment if you are taking drugs for sexual problems [e.g., sildenafil (Viagra), tadalafil (Cialis), vardenafil (Levitra)]. Use caution before taking cough-and-cold products, diet aids, or NSAIDs preparations because they may contain ingredients that could increase your blood pressure, cause a fast heartbeat, or increase chest pain (e.g., pseudoephedrine, phenylephrine, chlorpheniramine, diphenhydramine, clemastine, ibuprofen, and naproxen). Tell us if you drink alcohol, take alteplase, migraine drugs (ergotamine), water pills/diuretics such as furosemide or hydrochlorothiazide, or other drugs for high blood pressure (beta blockers, calcium channel blockers, ACE inhibitors)   We have sent a prescription for nitroglycerin 0.125% gel to [facility]. You should apply a pea size amount to your rectum three times daily x 6-8 weeks.  [facility]'s information is below: Address: [address], [place], [place] [address]  [phone]  *Please DO NOT go directly from our office to pick up this medication! Give the pharmacy 1 day to process the prescription as this is compounded at takes time to make. It was a  pleasure to see you today!  Please start Citrucel fiber supplement 1 tablespoon daily. Please start Reticare over the counter use 3 to 4 times  daily as needed.   It was a pleasure to see you today!  Dr. Loletha Carrow

## 2018-11-06 DIAGNOSIS — D689 Coagulation defect, unspecified: Secondary | ICD-10-CM | POA: Diagnosis not present

## 2018-11-06 DIAGNOSIS — E039 Hypothyroidism, unspecified: Secondary | ICD-10-CM | POA: Diagnosis not present

## 2018-11-06 DIAGNOSIS — D631 Anemia in chronic kidney disease: Secondary | ICD-10-CM | POA: Diagnosis not present

## 2018-11-06 DIAGNOSIS — Z992 Dependence on renal dialysis: Secondary | ICD-10-CM | POA: Diagnosis not present

## 2018-11-06 DIAGNOSIS — N2581 Secondary hyperparathyroidism of renal origin: Secondary | ICD-10-CM | POA: Diagnosis not present

## 2018-11-06 DIAGNOSIS — R52 Pain, unspecified: Secondary | ICD-10-CM | POA: Diagnosis not present

## 2018-11-06 DIAGNOSIS — N186 End stage renal disease: Secondary | ICD-10-CM | POA: Diagnosis not present

## 2018-11-09 DIAGNOSIS — D689 Coagulation defect, unspecified: Secondary | ICD-10-CM | POA: Diagnosis not present

## 2018-11-09 DIAGNOSIS — E039 Hypothyroidism, unspecified: Secondary | ICD-10-CM | POA: Diagnosis not present

## 2018-11-09 DIAGNOSIS — N2581 Secondary hyperparathyroidism of renal origin: Secondary | ICD-10-CM | POA: Diagnosis not present

## 2018-11-09 DIAGNOSIS — D631 Anemia in chronic kidney disease: Secondary | ICD-10-CM | POA: Diagnosis not present

## 2018-11-09 DIAGNOSIS — Z992 Dependence on renal dialysis: Secondary | ICD-10-CM | POA: Diagnosis not present

## 2018-11-09 DIAGNOSIS — N186 End stage renal disease: Secondary | ICD-10-CM | POA: Diagnosis not present

## 2018-11-09 DIAGNOSIS — R52 Pain, unspecified: Secondary | ICD-10-CM | POA: Diagnosis not present

## 2018-11-11 DIAGNOSIS — D631 Anemia in chronic kidney disease: Secondary | ICD-10-CM | POA: Diagnosis not present

## 2018-11-11 DIAGNOSIS — D689 Coagulation defect, unspecified: Secondary | ICD-10-CM | POA: Diagnosis not present

## 2018-11-11 DIAGNOSIS — N186 End stage renal disease: Secondary | ICD-10-CM | POA: Diagnosis not present

## 2018-11-11 DIAGNOSIS — R52 Pain, unspecified: Secondary | ICD-10-CM | POA: Diagnosis not present

## 2018-11-11 DIAGNOSIS — Z992 Dependence on renal dialysis: Secondary | ICD-10-CM | POA: Diagnosis not present

## 2018-11-11 DIAGNOSIS — N2581 Secondary hyperparathyroidism of renal origin: Secondary | ICD-10-CM | POA: Diagnosis not present

## 2018-11-11 DIAGNOSIS — E039 Hypothyroidism, unspecified: Secondary | ICD-10-CM | POA: Diagnosis not present

## 2018-11-13 DIAGNOSIS — E039 Hypothyroidism, unspecified: Secondary | ICD-10-CM | POA: Diagnosis not present

## 2018-11-13 DIAGNOSIS — N2581 Secondary hyperparathyroidism of renal origin: Secondary | ICD-10-CM | POA: Diagnosis not present

## 2018-11-13 DIAGNOSIS — N186 End stage renal disease: Secondary | ICD-10-CM | POA: Diagnosis not present

## 2018-11-13 DIAGNOSIS — D689 Coagulation defect, unspecified: Secondary | ICD-10-CM | POA: Diagnosis not present

## 2018-11-13 DIAGNOSIS — Z992 Dependence on renal dialysis: Secondary | ICD-10-CM | POA: Diagnosis not present

## 2018-11-13 DIAGNOSIS — R52 Pain, unspecified: Secondary | ICD-10-CM | POA: Diagnosis not present

## 2018-11-13 DIAGNOSIS — D631 Anemia in chronic kidney disease: Secondary | ICD-10-CM | POA: Diagnosis not present

## 2018-11-16 DIAGNOSIS — D631 Anemia in chronic kidney disease: Secondary | ICD-10-CM | POA: Diagnosis not present

## 2018-11-16 DIAGNOSIS — Z992 Dependence on renal dialysis: Secondary | ICD-10-CM | POA: Diagnosis not present

## 2018-11-16 DIAGNOSIS — N186 End stage renal disease: Secondary | ICD-10-CM | POA: Diagnosis not present

## 2018-11-16 DIAGNOSIS — N2581 Secondary hyperparathyroidism of renal origin: Secondary | ICD-10-CM | POA: Diagnosis not present

## 2018-11-16 DIAGNOSIS — E039 Hypothyroidism, unspecified: Secondary | ICD-10-CM | POA: Diagnosis not present

## 2018-11-16 DIAGNOSIS — R52 Pain, unspecified: Secondary | ICD-10-CM | POA: Diagnosis not present

## 2018-11-16 DIAGNOSIS — D689 Coagulation defect, unspecified: Secondary | ICD-10-CM | POA: Diagnosis not present

## 2018-11-18 DIAGNOSIS — E039 Hypothyroidism, unspecified: Secondary | ICD-10-CM | POA: Diagnosis not present

## 2018-11-18 DIAGNOSIS — R52 Pain, unspecified: Secondary | ICD-10-CM | POA: Diagnosis not present

## 2018-11-18 DIAGNOSIS — D689 Coagulation defect, unspecified: Secondary | ICD-10-CM | POA: Diagnosis not present

## 2018-11-18 DIAGNOSIS — Z992 Dependence on renal dialysis: Secondary | ICD-10-CM | POA: Diagnosis not present

## 2018-11-18 DIAGNOSIS — D631 Anemia in chronic kidney disease: Secondary | ICD-10-CM | POA: Diagnosis not present

## 2018-11-18 DIAGNOSIS — N2581 Secondary hyperparathyroidism of renal origin: Secondary | ICD-10-CM | POA: Diagnosis not present

## 2018-11-18 DIAGNOSIS — N186 End stage renal disease: Secondary | ICD-10-CM | POA: Diagnosis not present

## 2018-11-20 DIAGNOSIS — N186 End stage renal disease: Secondary | ICD-10-CM | POA: Diagnosis not present

## 2018-11-20 DIAGNOSIS — D631 Anemia in chronic kidney disease: Secondary | ICD-10-CM | POA: Diagnosis not present

## 2018-11-20 DIAGNOSIS — D689 Coagulation defect, unspecified: Secondary | ICD-10-CM | POA: Diagnosis not present

## 2018-11-20 DIAGNOSIS — R52 Pain, unspecified: Secondary | ICD-10-CM | POA: Diagnosis not present

## 2018-11-20 DIAGNOSIS — Z992 Dependence on renal dialysis: Secondary | ICD-10-CM | POA: Diagnosis not present

## 2018-11-20 DIAGNOSIS — E039 Hypothyroidism, unspecified: Secondary | ICD-10-CM | POA: Diagnosis not present

## 2018-11-20 DIAGNOSIS — N2581 Secondary hyperparathyroidism of renal origin: Secondary | ICD-10-CM | POA: Diagnosis not present

## 2018-11-23 DIAGNOSIS — Z992 Dependence on renal dialysis: Secondary | ICD-10-CM | POA: Diagnosis not present

## 2018-11-23 DIAGNOSIS — R52 Pain, unspecified: Secondary | ICD-10-CM | POA: Diagnosis not present

## 2018-11-23 DIAGNOSIS — N2581 Secondary hyperparathyroidism of renal origin: Secondary | ICD-10-CM | POA: Diagnosis not present

## 2018-11-23 DIAGNOSIS — D689 Coagulation defect, unspecified: Secondary | ICD-10-CM | POA: Diagnosis not present

## 2018-11-23 DIAGNOSIS — N186 End stage renal disease: Secondary | ICD-10-CM | POA: Diagnosis not present

## 2018-11-23 DIAGNOSIS — E039 Hypothyroidism, unspecified: Secondary | ICD-10-CM | POA: Diagnosis not present

## 2018-11-23 DIAGNOSIS — D631 Anemia in chronic kidney disease: Secondary | ICD-10-CM | POA: Diagnosis not present

## 2018-11-25 DIAGNOSIS — N2581 Secondary hyperparathyroidism of renal origin: Secondary | ICD-10-CM | POA: Diagnosis not present

## 2018-11-25 DIAGNOSIS — D689 Coagulation defect, unspecified: Secondary | ICD-10-CM | POA: Diagnosis not present

## 2018-11-25 DIAGNOSIS — E039 Hypothyroidism, unspecified: Secondary | ICD-10-CM | POA: Diagnosis not present

## 2018-11-25 DIAGNOSIS — Z89511 Acquired absence of right leg below knee: Secondary | ICD-10-CM | POA: Diagnosis not present

## 2018-11-25 DIAGNOSIS — Z992 Dependence on renal dialysis: Secondary | ICD-10-CM | POA: Diagnosis not present

## 2018-11-25 DIAGNOSIS — N186 End stage renal disease: Secondary | ICD-10-CM | POA: Diagnosis not present

## 2018-11-25 DIAGNOSIS — D631 Anemia in chronic kidney disease: Secondary | ICD-10-CM | POA: Diagnosis not present

## 2018-11-25 DIAGNOSIS — R52 Pain, unspecified: Secondary | ICD-10-CM | POA: Diagnosis not present

## 2018-11-27 DIAGNOSIS — E039 Hypothyroidism, unspecified: Secondary | ICD-10-CM | POA: Diagnosis not present

## 2018-11-27 DIAGNOSIS — N2581 Secondary hyperparathyroidism of renal origin: Secondary | ICD-10-CM | POA: Diagnosis not present

## 2018-11-27 DIAGNOSIS — Z992 Dependence on renal dialysis: Secondary | ICD-10-CM | POA: Diagnosis not present

## 2018-11-27 DIAGNOSIS — D689 Coagulation defect, unspecified: Secondary | ICD-10-CM | POA: Diagnosis not present

## 2018-11-27 DIAGNOSIS — N186 End stage renal disease: Secondary | ICD-10-CM | POA: Diagnosis not present

## 2018-11-27 DIAGNOSIS — R52 Pain, unspecified: Secondary | ICD-10-CM | POA: Diagnosis not present

## 2018-11-27 DIAGNOSIS — D631 Anemia in chronic kidney disease: Secondary | ICD-10-CM | POA: Diagnosis not present

## 2018-11-30 DIAGNOSIS — R52 Pain, unspecified: Secondary | ICD-10-CM | POA: Diagnosis not present

## 2018-11-30 DIAGNOSIS — N2581 Secondary hyperparathyroidism of renal origin: Secondary | ICD-10-CM | POA: Diagnosis not present

## 2018-11-30 DIAGNOSIS — N186 End stage renal disease: Secondary | ICD-10-CM | POA: Diagnosis not present

## 2018-11-30 DIAGNOSIS — Z992 Dependence on renal dialysis: Secondary | ICD-10-CM | POA: Diagnosis not present

## 2018-11-30 DIAGNOSIS — D689 Coagulation defect, unspecified: Secondary | ICD-10-CM | POA: Diagnosis not present

## 2018-11-30 DIAGNOSIS — D631 Anemia in chronic kidney disease: Secondary | ICD-10-CM | POA: Diagnosis not present

## 2018-11-30 DIAGNOSIS — E039 Hypothyroidism, unspecified: Secondary | ICD-10-CM | POA: Diagnosis not present

## 2018-11-30 DIAGNOSIS — E1122 Type 2 diabetes mellitus with diabetic chronic kidney disease: Secondary | ICD-10-CM | POA: Diagnosis not present

## 2018-12-02 DIAGNOSIS — N186 End stage renal disease: Secondary | ICD-10-CM | POA: Diagnosis not present

## 2018-12-02 DIAGNOSIS — D631 Anemia in chronic kidney disease: Secondary | ICD-10-CM | POA: Diagnosis not present

## 2018-12-02 DIAGNOSIS — Z992 Dependence on renal dialysis: Secondary | ICD-10-CM | POA: Diagnosis not present

## 2018-12-02 DIAGNOSIS — R52 Pain, unspecified: Secondary | ICD-10-CM | POA: Diagnosis not present

## 2018-12-02 DIAGNOSIS — E039 Hypothyroidism, unspecified: Secondary | ICD-10-CM | POA: Diagnosis not present

## 2018-12-02 DIAGNOSIS — L299 Pruritus, unspecified: Secondary | ICD-10-CM | POA: Diagnosis not present

## 2018-12-02 DIAGNOSIS — D689 Coagulation defect, unspecified: Secondary | ICD-10-CM | POA: Diagnosis not present

## 2018-12-02 DIAGNOSIS — N2581 Secondary hyperparathyroidism of renal origin: Secondary | ICD-10-CM | POA: Diagnosis not present

## 2018-12-04 DIAGNOSIS — E039 Hypothyroidism, unspecified: Secondary | ICD-10-CM | POA: Diagnosis not present

## 2018-12-04 DIAGNOSIS — D689 Coagulation defect, unspecified: Secondary | ICD-10-CM | POA: Diagnosis not present

## 2018-12-04 DIAGNOSIS — R52 Pain, unspecified: Secondary | ICD-10-CM | POA: Diagnosis not present

## 2018-12-04 DIAGNOSIS — N2581 Secondary hyperparathyroidism of renal origin: Secondary | ICD-10-CM | POA: Diagnosis not present

## 2018-12-04 DIAGNOSIS — N186 End stage renal disease: Secondary | ICD-10-CM | POA: Diagnosis not present

## 2018-12-04 DIAGNOSIS — L299 Pruritus, unspecified: Secondary | ICD-10-CM | POA: Diagnosis not present

## 2018-12-04 DIAGNOSIS — Z992 Dependence on renal dialysis: Secondary | ICD-10-CM | POA: Diagnosis not present

## 2018-12-04 DIAGNOSIS — D631 Anemia in chronic kidney disease: Secondary | ICD-10-CM | POA: Diagnosis not present

## 2018-12-07 DIAGNOSIS — N186 End stage renal disease: Secondary | ICD-10-CM | POA: Diagnosis not present

## 2018-12-07 DIAGNOSIS — Z992 Dependence on renal dialysis: Secondary | ICD-10-CM | POA: Diagnosis not present

## 2018-12-07 DIAGNOSIS — N2581 Secondary hyperparathyroidism of renal origin: Secondary | ICD-10-CM | POA: Diagnosis not present

## 2018-12-07 DIAGNOSIS — D689 Coagulation defect, unspecified: Secondary | ICD-10-CM | POA: Diagnosis not present

## 2018-12-07 DIAGNOSIS — L299 Pruritus, unspecified: Secondary | ICD-10-CM | POA: Diagnosis not present

## 2018-12-07 DIAGNOSIS — R52 Pain, unspecified: Secondary | ICD-10-CM | POA: Diagnosis not present

## 2018-12-07 DIAGNOSIS — D631 Anemia in chronic kidney disease: Secondary | ICD-10-CM | POA: Diagnosis not present

## 2018-12-07 DIAGNOSIS — E039 Hypothyroidism, unspecified: Secondary | ICD-10-CM | POA: Diagnosis not present

## 2018-12-09 DIAGNOSIS — N2581 Secondary hyperparathyroidism of renal origin: Secondary | ICD-10-CM | POA: Diagnosis not present

## 2018-12-09 DIAGNOSIS — E039 Hypothyroidism, unspecified: Secondary | ICD-10-CM | POA: Diagnosis not present

## 2018-12-09 DIAGNOSIS — Z992 Dependence on renal dialysis: Secondary | ICD-10-CM | POA: Diagnosis not present

## 2018-12-09 DIAGNOSIS — N186 End stage renal disease: Secondary | ICD-10-CM | POA: Diagnosis not present

## 2018-12-09 DIAGNOSIS — L299 Pruritus, unspecified: Secondary | ICD-10-CM | POA: Diagnosis not present

## 2018-12-09 DIAGNOSIS — R52 Pain, unspecified: Secondary | ICD-10-CM | POA: Diagnosis not present

## 2018-12-09 DIAGNOSIS — D631 Anemia in chronic kidney disease: Secondary | ICD-10-CM | POA: Diagnosis not present

## 2018-12-09 DIAGNOSIS — D689 Coagulation defect, unspecified: Secondary | ICD-10-CM | POA: Diagnosis not present

## 2018-12-11 ENCOUNTER — Telehealth (INDEPENDENT_AMBULATORY_CARE_PROVIDER_SITE_OTHER): Payer: Medicare Other | Admitting: Cardiology

## 2018-12-11 ENCOUNTER — Encounter: Payer: Self-pay | Admitting: Cardiology

## 2018-12-11 VITALS — BP 154/59 | HR 50 | Wt 261.5 lb

## 2018-12-11 DIAGNOSIS — D689 Coagulation defect, unspecified: Secondary | ICD-10-CM | POA: Diagnosis not present

## 2018-12-11 DIAGNOSIS — I2510 Atherosclerotic heart disease of native coronary artery without angina pectoris: Secondary | ICD-10-CM | POA: Diagnosis not present

## 2018-12-11 DIAGNOSIS — Z992 Dependence on renal dialysis: Secondary | ICD-10-CM | POA: Diagnosis not present

## 2018-12-11 DIAGNOSIS — I10 Essential (primary) hypertension: Secondary | ICD-10-CM | POA: Diagnosis not present

## 2018-12-11 DIAGNOSIS — N2581 Secondary hyperparathyroidism of renal origin: Secondary | ICD-10-CM | POA: Diagnosis not present

## 2018-12-11 DIAGNOSIS — Z7189 Other specified counseling: Secondary | ICD-10-CM | POA: Diagnosis not present

## 2018-12-11 DIAGNOSIS — D631 Anemia in chronic kidney disease: Secondary | ICD-10-CM | POA: Diagnosis not present

## 2018-12-11 DIAGNOSIS — I953 Hypotension of hemodialysis: Secondary | ICD-10-CM | POA: Diagnosis not present

## 2018-12-11 DIAGNOSIS — E039 Hypothyroidism, unspecified: Secondary | ICD-10-CM | POA: Diagnosis not present

## 2018-12-11 DIAGNOSIS — R52 Pain, unspecified: Secondary | ICD-10-CM | POA: Diagnosis not present

## 2018-12-11 DIAGNOSIS — L299 Pruritus, unspecified: Secondary | ICD-10-CM | POA: Diagnosis not present

## 2018-12-11 DIAGNOSIS — N186 End stage renal disease: Secondary | ICD-10-CM | POA: Diagnosis not present

## 2018-12-11 NOTE — Patient Instructions (Signed)

## 2018-12-11 NOTE — Progress Notes (Signed)
Virtual Visit via Telephone Note   This visit type was conducted due to national recommendations for restrictions regarding the COVID-19 Pandemic (e.g. social distancing) in an effort to limit this patient's exposure and mitigate transmission in our community.  Due to his co-morbid illnesses, this patient is at least at moderate risk for complications without adequate follow up.  This format is felt to be most appropriate for this patient at this time.  The patient did not have access to video technology/had technical difficulties with video requiring transitioning to audio format only (telephone).  All issues noted in this document were discussed and addressed.  No physical exam could be performed with this format.  Please refer to the patient's chart for his  consent to telehealth for [facility].   Date:  12/11/2018   ID:  Tim Becker, DOB [birth year], MRN [id number]  Patient Location: Other:  dialysis center Provider Location: Home  PCP:  Leeroy Cha, MD  Cardiologist:  Buford Dresser, MD  Electrophysiologist:  None   Evaluation Performed:  Follow-Up Visit  Chief Complaint:  Follow up  History of Present Illness:    Tim Becker is a 53 y.o. male with hx of CAD, hypertension, asthma, obstructive sleep apnea, type II diabetes, end stage renal disease on dialysis, history of amputation, and obesity (BMI 39). I first met him on 11/13/17, and that note contains details on his full history.   The patient does not have symptoms concerning for COVID-19 infection (fever, chills, cough, or new shortness of breath).   No chest pain. Blood pressure has been up and down. Running a little high recently, dropped his dry weight to see if it helps. Has a history of hypotension in the past, especially with dialysis. Has had Bps around 185 systolic after dialysis, but in the last week has been more 631-497 systolic after dialysis. Does have occasional lightheadedness when his  pressure is low.   Is in the process of renal transplant evaluation through [facility]. He has been doing the process virtually during the pandemic, but he believes he is close to finished with the initial workup. I discussed that from my standpoint, he does not have cardiac limitations, but if there is updated testing needed for transplant evaluation, we would be happy to facilitate that.   Denies chest pain, shortness of breath at rest or with normal exertion. No PND, orthopnea, LE edema or unexpected weight gain. No syncope or palpitations.  Past Medical History:  Diagnosis Date   Anemia    Arthritis    Cancer ([place])    Renal Tumor   CKD (chronic kidney disease)    Coronary artery disease    Diabetes ([facility])    DM (diabetes mellitus) with complications ([facility])    ESRD (end stage renal disease) ([place])    Pre- dialysis   Hypertension    Left kidney mass    Neuropathy    Obesity    Osteomyelitis, chronic, ankle or foot ([facility])    PONV (postoperative nausea and vomiting)    Renal insufficiency    Patient is on Dialysis and receives M,W and F.   S/P BKA (below knee amputation) [facility]) June 2016   Right , [place], [place]   Sleep apnea    CPAP   Thyroid disease    Past Surgical History:  Procedure Laterality Date   BASCILIC VEIN TRANSPOSITION Left 02/17/2015   Procedure: BASCILIC VEIN TRANSPOSITION;  Surgeon: Katha Cabal, MD;  Location: [facility];  Service: Vascular;  Laterality: Left;   BELOW KNEE LEG AMPUTATION Right    CORONARY ANGIOPLASTY WITH STENT PLACEMENT     EYE SURGERY     FOOT SURGERY     Multiple R foot surgery for infection and charcot foot   I&D EXTREMITY Left 04/16/2016   Procedure: IRRIGATION AND DEBRIDEMENT EXTREMITY/GREAT TOE AMP.;  Surgeon: Edrick Kins, DPM;  Location: [place];  Service: Podiatry;  Laterality: Left;   JOINT REPLACEMENT     LAPAROSCOPIC NEPHRECTOMY, HAND ASSISTED Left 07/11/2015   Procedure: HAND ASSISTED LAPAROSCOPIC  NEPHRECTOMY;  Surgeon: Hollice Espy, MD;  Location: [facility] OR[facility];  Service: Urology;  Laterality: Left;   PERIPHERAL VASCULAR CATHETERIZATION Left 12/06/2014   Procedure: A/V Shuntogram/Fistulagram;  Surgeon: Katha Cabal, MD;  Location: [place] [facility];  Service: Cardiovascular;  Laterality: Left;   PERIPHERAL VASCULAR CATHETERIZATION Left 12/06/2014   Procedure: A/V Shunt Intervention;  Surgeon: Katha Cabal, MD;  Location: [place] [facility];  Service: Cardiovascular;  Laterality: Left;   TONSILLECTOMY       Current Meds  Medication Sig   acetaminophen (TYLENOL) 500 MG tablet Take 1,000 mg by mouth every 6 (six) hours as needed for headache (pain).   AMBULATORY NON FORMULARY MEDICATION Nitroglycerine ointment 0.125 %  Apply a pea sized amount internally four times daily. Dispense 30 GM (Patient taking differently: Nitroglycerine ointment 0.125 %  Apply a pea sized amount internally four times daily as needed. Dispense 30 GM)   amLODipine (NORVASC) 10 MG tablet    atorvastatin (LIPITOR) 40 MG tablet Take 1 tablet (40 mg total) by mouth daily.   carvedilol (COREG) 25 MG tablet Take 25 mg by mouth 2 (two) times daily with a meal.    insulin glargine (LANTUS) 100 unit/mL SOPN Inject 80 Units into the skin daily before breakfast.   Insulin Lispro (HUMALOG KWIKPEN) 200 UNIT/ML SOPN Inject 10-20 Units into the skin See admin instructions. Inject 10-20 units (per sliding scale) two - three times daily with meals   PRESCRIPTION MEDICATION Inhale into the lungs at bedtime. CPAP     Allergies:   Patient has no known allergies.   Social History   Tobacco Use   Smoking status: Never Smoker   Smokeless tobacco: Never Used  Substance Use Topics   Alcohol use: No   Drug use: No     Family Hx: The patient's family history includes Breast cancer in his mother; Diabetes in his mother; Kidney disease in his mother. Mother had MI, breast cancer, diabetes, kidney disease,  amputation, passed away at age 25.  ROS:   Please see the history of present illness.    Constitutional: Negative for chills, fever, night sweats, unintentional weight loss  Respiratory: Negative for cough, sputum, wheezing.   Cardiovascular: See HPI. Gastrointestinal: Negative for abdominal pain, melena, and hematochezia.   Musculoskeletal: Negative for falls and myalgias.  Skin: Negative for itching and rash.  Neurological: Negative for focal weakness, focal sensory changes and loss of consciousness.  Endo/Heme/Allergies: Does not bruise/bleed easily.  All other systems reviewed and are negative.   Prior CV studies:   The following studies were reviewed today: No new studies since last visit  Labs/Other Tests and Data Reviewed:    EKG:  An ECG dated 11/13/17 was personally reviewed today and demonstrated:  NSR, PRWP  Recent Labs: No results found for requested labs within last 8760 hours.   Recent Lipid Panel No results found for: CHOL, TRIG, HDL, CHOLHDL, LDLCALC, LDLDIRECT  Wt Readings  from Last 3 Encounters:  12/11/18 261 lb 7.5 oz (118.6 kg)  11/04/18 259 lb 3.2 oz (117.6 kg)  11/13/17 261 lb 12.8 oz (118.8 kg)     Objective:    Vital Signs:  BP (!) 154/59    Pulse (!) 50    Wt 261 lb 7.5 oz (118.6 kg)    BMI 39.76 kg/m    Speaking comfortably on the phone, in no acute distress  ASSESSMENT & PLAN:    History of CAD: stable, asymptomatic -while there is no recommendation for statins in primary prevention in ESRD, for secondary prevention (and hopefully future renal transplant) would continue statin at this time -typically recommend aspirin 81 mg daily long term. We have discussed previously. We will hold on adding at this time  Hypertension and dialysis-related hypotension: has been labile. Typically runs low after dialysis but has been higher this last week -managed by his nephrology team -carvedilol, amlodipine per nephrology  CV risk factor management and  secondary prevention recommendations: Obesity: discussed lifestyle, below.  ESRD: on HD, being evaluated to see if he is a kidney transplant candidate Hypertension, Lipids: as abovegiven lability on dialysis days and symptomatic hypotension, will not further adjust carvedilol today Diabetes: on insulin, management per PCP  -recommend heart healthy/Mediterranean diet, with whole grains, fruits, vegetable, fish, lean meats, nuts, and olive oil. Limit salt. -recommend moderate walking, 3-5 times/week for 30-50 minutes each session. Aim for at least 150 minutes.week. Goal should be pace of 3 miles/hours, or walking 1.5 miles in 30 minutes -recommend avoidance of tobacco products. Avoid excess alcohol.  COVID-19 Education: The signs and symptoms of COVID-19 were discussed with the patient and how to seek care for testing (follow up with PCP or arrange E-visit).  The importance of social distancing was discussed today.  Time:   Today, I have spent 11 minutes with the patient with telehealth technology discussing the above problems.     Medication Adjustments/Labs and Tests Ordered: Current medicines are reviewed at length with the patient today.  Concerns regarding medicines are outlined above.   Tests Ordered: No orders of the defined types were placed in this encounter.   Medication Changes: No orders of the defined types were placed in this encounter.   Follow Up: 1 year  Signed, Buford Dresser, MD  12/11/2018 8:45 AM    [place]

## 2018-12-14 DIAGNOSIS — L299 Pruritus, unspecified: Secondary | ICD-10-CM | POA: Diagnosis not present

## 2018-12-14 DIAGNOSIS — D631 Anemia in chronic kidney disease: Secondary | ICD-10-CM | POA: Diagnosis not present

## 2018-12-14 DIAGNOSIS — N186 End stage renal disease: Secondary | ICD-10-CM | POA: Diagnosis not present

## 2018-12-14 DIAGNOSIS — N2581 Secondary hyperparathyroidism of renal origin: Secondary | ICD-10-CM | POA: Diagnosis not present

## 2018-12-14 DIAGNOSIS — R52 Pain, unspecified: Secondary | ICD-10-CM | POA: Diagnosis not present

## 2018-12-14 DIAGNOSIS — E039 Hypothyroidism, unspecified: Secondary | ICD-10-CM | POA: Diagnosis not present

## 2018-12-14 DIAGNOSIS — Z992 Dependence on renal dialysis: Secondary | ICD-10-CM | POA: Diagnosis not present

## 2018-12-14 DIAGNOSIS — D689 Coagulation defect, unspecified: Secondary | ICD-10-CM | POA: Diagnosis not present

## 2018-12-16 ENCOUNTER — Ambulatory Visit: Payer: Medicare Other | Admitting: Gastroenterology

## 2018-12-16 DIAGNOSIS — N186 End stage renal disease: Secondary | ICD-10-CM | POA: Diagnosis not present

## 2018-12-16 DIAGNOSIS — E039 Hypothyroidism, unspecified: Secondary | ICD-10-CM | POA: Diagnosis not present

## 2018-12-16 DIAGNOSIS — D631 Anemia in chronic kidney disease: Secondary | ICD-10-CM | POA: Diagnosis not present

## 2018-12-16 DIAGNOSIS — L299 Pruritus, unspecified: Secondary | ICD-10-CM | POA: Diagnosis not present

## 2018-12-16 DIAGNOSIS — Z992 Dependence on renal dialysis: Secondary | ICD-10-CM | POA: Diagnosis not present

## 2018-12-16 DIAGNOSIS — D689 Coagulation defect, unspecified: Secondary | ICD-10-CM | POA: Diagnosis not present

## 2018-12-16 DIAGNOSIS — N2581 Secondary hyperparathyroidism of renal origin: Secondary | ICD-10-CM | POA: Diagnosis not present

## 2018-12-16 DIAGNOSIS — R52 Pain, unspecified: Secondary | ICD-10-CM | POA: Diagnosis not present

## 2018-12-17 ENCOUNTER — Ambulatory Visit: Payer: Medicare Other | Admitting: Gastroenterology

## 2018-12-18 DIAGNOSIS — R52 Pain, unspecified: Secondary | ICD-10-CM | POA: Diagnosis not present

## 2018-12-18 DIAGNOSIS — E039 Hypothyroidism, unspecified: Secondary | ICD-10-CM | POA: Diagnosis not present

## 2018-12-18 DIAGNOSIS — L299 Pruritus, unspecified: Secondary | ICD-10-CM | POA: Diagnosis not present

## 2018-12-18 DIAGNOSIS — Z992 Dependence on renal dialysis: Secondary | ICD-10-CM | POA: Diagnosis not present

## 2018-12-18 DIAGNOSIS — D689 Coagulation defect, unspecified: Secondary | ICD-10-CM | POA: Diagnosis not present

## 2018-12-18 DIAGNOSIS — N2581 Secondary hyperparathyroidism of renal origin: Secondary | ICD-10-CM | POA: Diagnosis not present

## 2018-12-18 DIAGNOSIS — N186 End stage renal disease: Secondary | ICD-10-CM | POA: Diagnosis not present

## 2018-12-18 DIAGNOSIS — D631 Anemia in chronic kidney disease: Secondary | ICD-10-CM | POA: Diagnosis not present

## 2018-12-21 DIAGNOSIS — N2581 Secondary hyperparathyroidism of renal origin: Secondary | ICD-10-CM | POA: Diagnosis not present

## 2018-12-21 DIAGNOSIS — R52 Pain, unspecified: Secondary | ICD-10-CM | POA: Diagnosis not present

## 2018-12-21 DIAGNOSIS — L299 Pruritus, unspecified: Secondary | ICD-10-CM | POA: Diagnosis not present

## 2018-12-21 DIAGNOSIS — D631 Anemia in chronic kidney disease: Secondary | ICD-10-CM | POA: Diagnosis not present

## 2018-12-21 DIAGNOSIS — E039 Hypothyroidism, unspecified: Secondary | ICD-10-CM | POA: Diagnosis not present

## 2018-12-21 DIAGNOSIS — N186 End stage renal disease: Secondary | ICD-10-CM | POA: Diagnosis not present

## 2018-12-21 DIAGNOSIS — D689 Coagulation defect, unspecified: Secondary | ICD-10-CM | POA: Diagnosis not present

## 2018-12-21 DIAGNOSIS — Z992 Dependence on renal dialysis: Secondary | ICD-10-CM | POA: Diagnosis not present

## 2018-12-23 DIAGNOSIS — R52 Pain, unspecified: Secondary | ICD-10-CM | POA: Diagnosis not present

## 2018-12-23 DIAGNOSIS — D631 Anemia in chronic kidney disease: Secondary | ICD-10-CM | POA: Diagnosis not present

## 2018-12-23 DIAGNOSIS — L299 Pruritus, unspecified: Secondary | ICD-10-CM | POA: Diagnosis not present

## 2018-12-23 DIAGNOSIS — N186 End stage renal disease: Secondary | ICD-10-CM | POA: Diagnosis not present

## 2018-12-23 DIAGNOSIS — N2581 Secondary hyperparathyroidism of renal origin: Secondary | ICD-10-CM | POA: Diagnosis not present

## 2018-12-23 DIAGNOSIS — E039 Hypothyroidism, unspecified: Secondary | ICD-10-CM | POA: Diagnosis not present

## 2018-12-23 DIAGNOSIS — D689 Coagulation defect, unspecified: Secondary | ICD-10-CM | POA: Diagnosis not present

## 2018-12-23 DIAGNOSIS — Z992 Dependence on renal dialysis: Secondary | ICD-10-CM | POA: Diagnosis not present

## 2018-12-25 DIAGNOSIS — Z992 Dependence on renal dialysis: Secondary | ICD-10-CM | POA: Diagnosis not present

## 2018-12-25 DIAGNOSIS — N186 End stage renal disease: Secondary | ICD-10-CM | POA: Diagnosis not present

## 2018-12-25 DIAGNOSIS — R52 Pain, unspecified: Secondary | ICD-10-CM | POA: Diagnosis not present

## 2018-12-25 DIAGNOSIS — N2581 Secondary hyperparathyroidism of renal origin: Secondary | ICD-10-CM | POA: Diagnosis not present

## 2018-12-25 DIAGNOSIS — D631 Anemia in chronic kidney disease: Secondary | ICD-10-CM | POA: Diagnosis not present

## 2018-12-25 DIAGNOSIS — L299 Pruritus, unspecified: Secondary | ICD-10-CM | POA: Diagnosis not present

## 2018-12-25 DIAGNOSIS — E039 Hypothyroidism, unspecified: Secondary | ICD-10-CM | POA: Diagnosis not present

## 2018-12-25 DIAGNOSIS — D689 Coagulation defect, unspecified: Secondary | ICD-10-CM | POA: Diagnosis not present

## 2018-12-28 DIAGNOSIS — E039 Hypothyroidism, unspecified: Secondary | ICD-10-CM | POA: Diagnosis not present

## 2018-12-28 DIAGNOSIS — D631 Anemia in chronic kidney disease: Secondary | ICD-10-CM | POA: Diagnosis not present

## 2018-12-28 DIAGNOSIS — D689 Coagulation defect, unspecified: Secondary | ICD-10-CM | POA: Diagnosis not present

## 2018-12-28 DIAGNOSIS — N186 End stage renal disease: Secondary | ICD-10-CM | POA: Diagnosis not present

## 2018-12-28 DIAGNOSIS — N2581 Secondary hyperparathyroidism of renal origin: Secondary | ICD-10-CM | POA: Diagnosis not present

## 2018-12-28 DIAGNOSIS — L299 Pruritus, unspecified: Secondary | ICD-10-CM | POA: Diagnosis not present

## 2018-12-28 DIAGNOSIS — R52 Pain, unspecified: Secondary | ICD-10-CM | POA: Diagnosis not present

## 2018-12-28 DIAGNOSIS — Z992 Dependence on renal dialysis: Secondary | ICD-10-CM | POA: Diagnosis not present

## 2018-12-30 DIAGNOSIS — E1122 Type 2 diabetes mellitus with diabetic chronic kidney disease: Secondary | ICD-10-CM | POA: Diagnosis not present

## 2018-12-30 DIAGNOSIS — N2581 Secondary hyperparathyroidism of renal origin: Secondary | ICD-10-CM | POA: Diagnosis not present

## 2018-12-30 DIAGNOSIS — E039 Hypothyroidism, unspecified: Secondary | ICD-10-CM | POA: Diagnosis not present

## 2018-12-30 DIAGNOSIS — N186 End stage renal disease: Secondary | ICD-10-CM | POA: Diagnosis not present

## 2018-12-30 DIAGNOSIS — D631 Anemia in chronic kidney disease: Secondary | ICD-10-CM | POA: Diagnosis not present

## 2018-12-30 DIAGNOSIS — Z992 Dependence on renal dialysis: Secondary | ICD-10-CM | POA: Diagnosis not present

## 2018-12-30 DIAGNOSIS — L299 Pruritus, unspecified: Secondary | ICD-10-CM | POA: Diagnosis not present

## 2018-12-30 DIAGNOSIS — D689 Coagulation defect, unspecified: Secondary | ICD-10-CM | POA: Diagnosis not present

## 2018-12-30 DIAGNOSIS — R52 Pain, unspecified: Secondary | ICD-10-CM | POA: Diagnosis not present

## 2018-12-31 ENCOUNTER — Other Ambulatory Visit: Payer: Self-pay | Admitting: Cardiology

## 2018-12-31 DIAGNOSIS — I2510 Atherosclerotic heart disease of native coronary artery without angina pectoris: Secondary | ICD-10-CM

## 2019-01-01 DIAGNOSIS — Z992 Dependence on renal dialysis: Secondary | ICD-10-CM | POA: Diagnosis not present

## 2019-01-01 DIAGNOSIS — E039 Hypothyroidism, unspecified: Secondary | ICD-10-CM | POA: Diagnosis not present

## 2019-01-01 DIAGNOSIS — D631 Anemia in chronic kidney disease: Secondary | ICD-10-CM | POA: Diagnosis not present

## 2019-01-01 DIAGNOSIS — D689 Coagulation defect, unspecified: Secondary | ICD-10-CM | POA: Diagnosis not present

## 2019-01-01 DIAGNOSIS — N186 End stage renal disease: Secondary | ICD-10-CM | POA: Diagnosis not present

## 2019-01-01 DIAGNOSIS — E1122 Type 2 diabetes mellitus with diabetic chronic kidney disease: Secondary | ICD-10-CM | POA: Diagnosis not present

## 2019-01-01 DIAGNOSIS — D509 Iron deficiency anemia, unspecified: Secondary | ICD-10-CM | POA: Diagnosis not present

## 2019-01-01 DIAGNOSIS — N2581 Secondary hyperparathyroidism of renal origin: Secondary | ICD-10-CM | POA: Diagnosis not present

## 2019-01-04 DIAGNOSIS — E1122 Type 2 diabetes mellitus with diabetic chronic kidney disease: Secondary | ICD-10-CM | POA: Diagnosis not present

## 2019-01-04 DIAGNOSIS — D631 Anemia in chronic kidney disease: Secondary | ICD-10-CM | POA: Diagnosis not present

## 2019-01-04 DIAGNOSIS — D689 Coagulation defect, unspecified: Secondary | ICD-10-CM | POA: Diagnosis not present

## 2019-01-04 DIAGNOSIS — N2581 Secondary hyperparathyroidism of renal origin: Secondary | ICD-10-CM | POA: Diagnosis not present

## 2019-01-04 DIAGNOSIS — E039 Hypothyroidism, unspecified: Secondary | ICD-10-CM | POA: Diagnosis not present

## 2019-01-04 DIAGNOSIS — Z992 Dependence on renal dialysis: Secondary | ICD-10-CM | POA: Diagnosis not present

## 2019-01-04 DIAGNOSIS — N186 End stage renal disease: Secondary | ICD-10-CM | POA: Diagnosis not present

## 2019-01-04 DIAGNOSIS — D509 Iron deficiency anemia, unspecified: Secondary | ICD-10-CM | POA: Diagnosis not present

## 2019-01-06 DIAGNOSIS — D689 Coagulation defect, unspecified: Secondary | ICD-10-CM | POA: Diagnosis not present

## 2019-01-06 DIAGNOSIS — Z992 Dependence on renal dialysis: Secondary | ICD-10-CM | POA: Diagnosis not present

## 2019-01-06 DIAGNOSIS — D631 Anemia in chronic kidney disease: Secondary | ICD-10-CM | POA: Diagnosis not present

## 2019-01-06 DIAGNOSIS — N2581 Secondary hyperparathyroidism of renal origin: Secondary | ICD-10-CM | POA: Diagnosis not present

## 2019-01-06 DIAGNOSIS — E1122 Type 2 diabetes mellitus with diabetic chronic kidney disease: Secondary | ICD-10-CM | POA: Diagnosis not present

## 2019-01-06 DIAGNOSIS — E039 Hypothyroidism, unspecified: Secondary | ICD-10-CM | POA: Diagnosis not present

## 2019-01-06 DIAGNOSIS — N186 End stage renal disease: Secondary | ICD-10-CM | POA: Diagnosis not present

## 2019-01-06 DIAGNOSIS — D509 Iron deficiency anemia, unspecified: Secondary | ICD-10-CM | POA: Diagnosis not present

## 2019-01-08 DIAGNOSIS — E039 Hypothyroidism, unspecified: Secondary | ICD-10-CM | POA: Diagnosis not present

## 2019-01-08 DIAGNOSIS — D689 Coagulation defect, unspecified: Secondary | ICD-10-CM | POA: Diagnosis not present

## 2019-01-08 DIAGNOSIS — D509 Iron deficiency anemia, unspecified: Secondary | ICD-10-CM | POA: Diagnosis not present

## 2019-01-08 DIAGNOSIS — N2581 Secondary hyperparathyroidism of renal origin: Secondary | ICD-10-CM | POA: Diagnosis not present

## 2019-01-08 DIAGNOSIS — D631 Anemia in chronic kidney disease: Secondary | ICD-10-CM | POA: Diagnosis not present

## 2019-01-08 DIAGNOSIS — Z992 Dependence on renal dialysis: Secondary | ICD-10-CM | POA: Diagnosis not present

## 2019-01-08 DIAGNOSIS — E1122 Type 2 diabetes mellitus with diabetic chronic kidney disease: Secondary | ICD-10-CM | POA: Diagnosis not present

## 2019-01-08 DIAGNOSIS — N186 End stage renal disease: Secondary | ICD-10-CM | POA: Diagnosis not present

## 2019-01-11 DIAGNOSIS — N2581 Secondary hyperparathyroidism of renal origin: Secondary | ICD-10-CM | POA: Diagnosis not present

## 2019-01-11 DIAGNOSIS — D509 Iron deficiency anemia, unspecified: Secondary | ICD-10-CM | POA: Diagnosis not present

## 2019-01-11 DIAGNOSIS — D631 Anemia in chronic kidney disease: Secondary | ICD-10-CM | POA: Diagnosis not present

## 2019-01-11 DIAGNOSIS — Z992 Dependence on renal dialysis: Secondary | ICD-10-CM | POA: Diagnosis not present

## 2019-01-11 DIAGNOSIS — E1122 Type 2 diabetes mellitus with diabetic chronic kidney disease: Secondary | ICD-10-CM | POA: Diagnosis not present

## 2019-01-11 DIAGNOSIS — E039 Hypothyroidism, unspecified: Secondary | ICD-10-CM | POA: Diagnosis not present

## 2019-01-11 DIAGNOSIS — D689 Coagulation defect, unspecified: Secondary | ICD-10-CM | POA: Diagnosis not present

## 2019-01-11 DIAGNOSIS — N186 End stage renal disease: Secondary | ICD-10-CM | POA: Diagnosis not present

## 2019-01-13 DIAGNOSIS — N186 End stage renal disease: Secondary | ICD-10-CM | POA: Diagnosis not present

## 2019-01-13 DIAGNOSIS — N2581 Secondary hyperparathyroidism of renal origin: Secondary | ICD-10-CM | POA: Diagnosis not present

## 2019-01-13 DIAGNOSIS — E1122 Type 2 diabetes mellitus with diabetic chronic kidney disease: Secondary | ICD-10-CM | POA: Diagnosis not present

## 2019-01-13 DIAGNOSIS — Z992 Dependence on renal dialysis: Secondary | ICD-10-CM | POA: Diagnosis not present

## 2019-01-13 DIAGNOSIS — D631 Anemia in chronic kidney disease: Secondary | ICD-10-CM | POA: Diagnosis not present

## 2019-01-13 DIAGNOSIS — D689 Coagulation defect, unspecified: Secondary | ICD-10-CM | POA: Diagnosis not present

## 2019-01-13 DIAGNOSIS — E039 Hypothyroidism, unspecified: Secondary | ICD-10-CM | POA: Diagnosis not present

## 2019-01-13 DIAGNOSIS — D509 Iron deficiency anemia, unspecified: Secondary | ICD-10-CM | POA: Diagnosis not present

## 2019-01-15 DIAGNOSIS — Z992 Dependence on renal dialysis: Secondary | ICD-10-CM | POA: Diagnosis not present

## 2019-01-15 DIAGNOSIS — N2581 Secondary hyperparathyroidism of renal origin: Secondary | ICD-10-CM | POA: Diagnosis not present

## 2019-01-15 DIAGNOSIS — N186 End stage renal disease: Secondary | ICD-10-CM | POA: Diagnosis not present

## 2019-01-15 DIAGNOSIS — D509 Iron deficiency anemia, unspecified: Secondary | ICD-10-CM | POA: Diagnosis not present

## 2019-01-15 DIAGNOSIS — D689 Coagulation defect, unspecified: Secondary | ICD-10-CM | POA: Diagnosis not present

## 2019-01-15 DIAGNOSIS — E039 Hypothyroidism, unspecified: Secondary | ICD-10-CM | POA: Diagnosis not present

## 2019-01-15 DIAGNOSIS — E1122 Type 2 diabetes mellitus with diabetic chronic kidney disease: Secondary | ICD-10-CM | POA: Diagnosis not present

## 2019-01-15 DIAGNOSIS — D631 Anemia in chronic kidney disease: Secondary | ICD-10-CM | POA: Diagnosis not present

## 2019-01-18 DIAGNOSIS — N186 End stage renal disease: Secondary | ICD-10-CM | POA: Diagnosis not present

## 2019-01-18 DIAGNOSIS — N2581 Secondary hyperparathyroidism of renal origin: Secondary | ICD-10-CM | POA: Diagnosis not present

## 2019-01-18 DIAGNOSIS — D631 Anemia in chronic kidney disease: Secondary | ICD-10-CM | POA: Diagnosis not present

## 2019-01-18 DIAGNOSIS — Z992 Dependence on renal dialysis: Secondary | ICD-10-CM | POA: Diagnosis not present

## 2019-01-18 DIAGNOSIS — E039 Hypothyroidism, unspecified: Secondary | ICD-10-CM | POA: Diagnosis not present

## 2019-01-18 DIAGNOSIS — D509 Iron deficiency anemia, unspecified: Secondary | ICD-10-CM | POA: Diagnosis not present

## 2019-01-18 DIAGNOSIS — D689 Coagulation defect, unspecified: Secondary | ICD-10-CM | POA: Diagnosis not present

## 2019-01-18 DIAGNOSIS — E1122 Type 2 diabetes mellitus with diabetic chronic kidney disease: Secondary | ICD-10-CM | POA: Diagnosis not present

## 2019-01-20 DIAGNOSIS — D689 Coagulation defect, unspecified: Secondary | ICD-10-CM | POA: Diagnosis not present

## 2019-01-20 DIAGNOSIS — E039 Hypothyroidism, unspecified: Secondary | ICD-10-CM | POA: Diagnosis not present

## 2019-01-20 DIAGNOSIS — E1122 Type 2 diabetes mellitus with diabetic chronic kidney disease: Secondary | ICD-10-CM | POA: Diagnosis not present

## 2019-01-20 DIAGNOSIS — N2581 Secondary hyperparathyroidism of renal origin: Secondary | ICD-10-CM | POA: Diagnosis not present

## 2019-01-20 DIAGNOSIS — Z992 Dependence on renal dialysis: Secondary | ICD-10-CM | POA: Diagnosis not present

## 2019-01-20 DIAGNOSIS — N186 End stage renal disease: Secondary | ICD-10-CM | POA: Diagnosis not present

## 2019-01-20 DIAGNOSIS — D631 Anemia in chronic kidney disease: Secondary | ICD-10-CM | POA: Diagnosis not present

## 2019-01-20 DIAGNOSIS — D509 Iron deficiency anemia, unspecified: Secondary | ICD-10-CM | POA: Diagnosis not present

## 2019-01-22 DIAGNOSIS — Z992 Dependence on renal dialysis: Secondary | ICD-10-CM | POA: Diagnosis not present

## 2019-01-22 DIAGNOSIS — D509 Iron deficiency anemia, unspecified: Secondary | ICD-10-CM | POA: Diagnosis not present

## 2019-01-22 DIAGNOSIS — E1122 Type 2 diabetes mellitus with diabetic chronic kidney disease: Secondary | ICD-10-CM | POA: Diagnosis not present

## 2019-01-22 DIAGNOSIS — E039 Hypothyroidism, unspecified: Secondary | ICD-10-CM | POA: Diagnosis not present

## 2019-01-22 DIAGNOSIS — D689 Coagulation defect, unspecified: Secondary | ICD-10-CM | POA: Diagnosis not present

## 2019-01-22 DIAGNOSIS — D631 Anemia in chronic kidney disease: Secondary | ICD-10-CM | POA: Diagnosis not present

## 2019-01-22 DIAGNOSIS — N186 End stage renal disease: Secondary | ICD-10-CM | POA: Diagnosis not present

## 2019-01-22 DIAGNOSIS — N2581 Secondary hyperparathyroidism of renal origin: Secondary | ICD-10-CM | POA: Diagnosis not present

## 2019-01-25 DIAGNOSIS — E1122 Type 2 diabetes mellitus with diabetic chronic kidney disease: Secondary | ICD-10-CM | POA: Diagnosis not present

## 2019-01-25 DIAGNOSIS — Z992 Dependence on renal dialysis: Secondary | ICD-10-CM | POA: Diagnosis not present

## 2019-01-25 DIAGNOSIS — D689 Coagulation defect, unspecified: Secondary | ICD-10-CM | POA: Diagnosis not present

## 2019-01-25 DIAGNOSIS — D631 Anemia in chronic kidney disease: Secondary | ICD-10-CM | POA: Diagnosis not present

## 2019-01-25 DIAGNOSIS — N186 End stage renal disease: Secondary | ICD-10-CM | POA: Diagnosis not present

## 2019-01-25 DIAGNOSIS — D509 Iron deficiency anemia, unspecified: Secondary | ICD-10-CM | POA: Diagnosis not present

## 2019-01-25 DIAGNOSIS — E039 Hypothyroidism, unspecified: Secondary | ICD-10-CM | POA: Diagnosis not present

## 2019-01-25 DIAGNOSIS — N2581 Secondary hyperparathyroidism of renal origin: Secondary | ICD-10-CM | POA: Diagnosis not present

## 2019-01-27 DIAGNOSIS — Z992 Dependence on renal dialysis: Secondary | ICD-10-CM | POA: Diagnosis not present

## 2019-01-27 DIAGNOSIS — E1122 Type 2 diabetes mellitus with diabetic chronic kidney disease: Secondary | ICD-10-CM | POA: Diagnosis not present

## 2019-01-27 DIAGNOSIS — D509 Iron deficiency anemia, unspecified: Secondary | ICD-10-CM | POA: Diagnosis not present

## 2019-01-27 DIAGNOSIS — N186 End stage renal disease: Secondary | ICD-10-CM | POA: Diagnosis not present

## 2019-01-27 DIAGNOSIS — N2581 Secondary hyperparathyroidism of renal origin: Secondary | ICD-10-CM | POA: Diagnosis not present

## 2019-01-27 DIAGNOSIS — D689 Coagulation defect, unspecified: Secondary | ICD-10-CM | POA: Diagnosis not present

## 2019-01-27 DIAGNOSIS — E039 Hypothyroidism, unspecified: Secondary | ICD-10-CM | POA: Diagnosis not present

## 2019-01-27 DIAGNOSIS — D631 Anemia in chronic kidney disease: Secondary | ICD-10-CM | POA: Diagnosis not present

## 2019-01-29 DIAGNOSIS — D631 Anemia in chronic kidney disease: Secondary | ICD-10-CM | POA: Diagnosis not present

## 2019-01-29 DIAGNOSIS — E039 Hypothyroidism, unspecified: Secondary | ICD-10-CM | POA: Diagnosis not present

## 2019-01-29 DIAGNOSIS — N2581 Secondary hyperparathyroidism of renal origin: Secondary | ICD-10-CM | POA: Diagnosis not present

## 2019-01-29 DIAGNOSIS — N186 End stage renal disease: Secondary | ICD-10-CM | POA: Diagnosis not present

## 2019-01-29 DIAGNOSIS — D689 Coagulation defect, unspecified: Secondary | ICD-10-CM | POA: Diagnosis not present

## 2019-01-29 DIAGNOSIS — E1122 Type 2 diabetes mellitus with diabetic chronic kidney disease: Secondary | ICD-10-CM | POA: Diagnosis not present

## 2019-01-29 DIAGNOSIS — Z992 Dependence on renal dialysis: Secondary | ICD-10-CM | POA: Diagnosis not present

## 2019-01-29 DIAGNOSIS — D509 Iron deficiency anemia, unspecified: Secondary | ICD-10-CM | POA: Diagnosis not present

## 2019-01-30 DIAGNOSIS — N186 End stage renal disease: Secondary | ICD-10-CM | POA: Diagnosis not present

## 2019-01-30 DIAGNOSIS — E1122 Type 2 diabetes mellitus with diabetic chronic kidney disease: Secondary | ICD-10-CM | POA: Diagnosis not present

## 2019-01-30 DIAGNOSIS — Z992 Dependence on renal dialysis: Secondary | ICD-10-CM | POA: Diagnosis not present

## 2019-02-01 DIAGNOSIS — Z992 Dependence on renal dialysis: Secondary | ICD-10-CM | POA: Diagnosis not present

## 2019-02-01 DIAGNOSIS — E039 Hypothyroidism, unspecified: Secondary | ICD-10-CM | POA: Diagnosis not present

## 2019-02-01 DIAGNOSIS — N186 End stage renal disease: Secondary | ICD-10-CM | POA: Diagnosis not present

## 2019-02-01 DIAGNOSIS — D631 Anemia in chronic kidney disease: Secondary | ICD-10-CM | POA: Diagnosis not present

## 2019-02-01 DIAGNOSIS — N2581 Secondary hyperparathyroidism of renal origin: Secondary | ICD-10-CM | POA: Diagnosis not present

## 2019-02-01 DIAGNOSIS — D689 Coagulation defect, unspecified: Secondary | ICD-10-CM | POA: Diagnosis not present

## 2019-02-01 DIAGNOSIS — D509 Iron deficiency anemia, unspecified: Secondary | ICD-10-CM | POA: Diagnosis not present

## 2019-02-03 DIAGNOSIS — D631 Anemia in chronic kidney disease: Secondary | ICD-10-CM | POA: Diagnosis not present

## 2019-02-03 DIAGNOSIS — N2581 Secondary hyperparathyroidism of renal origin: Secondary | ICD-10-CM | POA: Diagnosis not present

## 2019-02-03 DIAGNOSIS — E039 Hypothyroidism, unspecified: Secondary | ICD-10-CM | POA: Diagnosis not present

## 2019-02-03 DIAGNOSIS — D509 Iron deficiency anemia, unspecified: Secondary | ICD-10-CM | POA: Diagnosis not present

## 2019-02-03 DIAGNOSIS — D689 Coagulation defect, unspecified: Secondary | ICD-10-CM | POA: Diagnosis not present

## 2019-02-03 DIAGNOSIS — N186 End stage renal disease: Secondary | ICD-10-CM | POA: Diagnosis not present

## 2019-02-03 DIAGNOSIS — Z992 Dependence on renal dialysis: Secondary | ICD-10-CM | POA: Diagnosis not present

## 2019-02-05 DIAGNOSIS — N186 End stage renal disease: Secondary | ICD-10-CM | POA: Diagnosis not present

## 2019-02-05 DIAGNOSIS — Z992 Dependence on renal dialysis: Secondary | ICD-10-CM | POA: Diagnosis not present

## 2019-02-05 DIAGNOSIS — N2581 Secondary hyperparathyroidism of renal origin: Secondary | ICD-10-CM | POA: Diagnosis not present

## 2019-02-05 DIAGNOSIS — D509 Iron deficiency anemia, unspecified: Secondary | ICD-10-CM | POA: Diagnosis not present

## 2019-02-05 DIAGNOSIS — D689 Coagulation defect, unspecified: Secondary | ICD-10-CM | POA: Diagnosis not present

## 2019-02-05 DIAGNOSIS — D631 Anemia in chronic kidney disease: Secondary | ICD-10-CM | POA: Diagnosis not present

## 2019-02-05 DIAGNOSIS — E039 Hypothyroidism, unspecified: Secondary | ICD-10-CM | POA: Diagnosis not present

## 2019-02-08 DIAGNOSIS — N2581 Secondary hyperparathyroidism of renal origin: Secondary | ICD-10-CM | POA: Diagnosis not present

## 2019-02-08 DIAGNOSIS — E039 Hypothyroidism, unspecified: Secondary | ICD-10-CM | POA: Diagnosis not present

## 2019-02-08 DIAGNOSIS — Z992 Dependence on renal dialysis: Secondary | ICD-10-CM | POA: Diagnosis not present

## 2019-02-08 DIAGNOSIS — N186 End stage renal disease: Secondary | ICD-10-CM | POA: Diagnosis not present

## 2019-02-08 DIAGNOSIS — D509 Iron deficiency anemia, unspecified: Secondary | ICD-10-CM | POA: Diagnosis not present

## 2019-02-08 DIAGNOSIS — D689 Coagulation defect, unspecified: Secondary | ICD-10-CM | POA: Diagnosis not present

## 2019-02-08 DIAGNOSIS — D631 Anemia in chronic kidney disease: Secondary | ICD-10-CM | POA: Diagnosis not present

## 2019-02-10 DIAGNOSIS — Z992 Dependence on renal dialysis: Secondary | ICD-10-CM | POA: Diagnosis not present

## 2019-02-10 DIAGNOSIS — E039 Hypothyroidism, unspecified: Secondary | ICD-10-CM | POA: Diagnosis not present

## 2019-02-10 DIAGNOSIS — D689 Coagulation defect, unspecified: Secondary | ICD-10-CM | POA: Diagnosis not present

## 2019-02-10 DIAGNOSIS — D631 Anemia in chronic kidney disease: Secondary | ICD-10-CM | POA: Diagnosis not present

## 2019-02-10 DIAGNOSIS — N2581 Secondary hyperparathyroidism of renal origin: Secondary | ICD-10-CM | POA: Diagnosis not present

## 2019-02-10 DIAGNOSIS — D509 Iron deficiency anemia, unspecified: Secondary | ICD-10-CM | POA: Diagnosis not present

## 2019-02-10 DIAGNOSIS — N186 End stage renal disease: Secondary | ICD-10-CM | POA: Diagnosis not present

## 2019-02-12 DIAGNOSIS — N2581 Secondary hyperparathyroidism of renal origin: Secondary | ICD-10-CM | POA: Diagnosis not present

## 2019-02-12 DIAGNOSIS — D689 Coagulation defect, unspecified: Secondary | ICD-10-CM | POA: Diagnosis not present

## 2019-02-12 DIAGNOSIS — D509 Iron deficiency anemia, unspecified: Secondary | ICD-10-CM | POA: Diagnosis not present

## 2019-02-12 DIAGNOSIS — D631 Anemia in chronic kidney disease: Secondary | ICD-10-CM | POA: Diagnosis not present

## 2019-02-12 DIAGNOSIS — Z992 Dependence on renal dialysis: Secondary | ICD-10-CM | POA: Diagnosis not present

## 2019-02-12 DIAGNOSIS — N186 End stage renal disease: Secondary | ICD-10-CM | POA: Diagnosis not present

## 2019-02-12 DIAGNOSIS — E039 Hypothyroidism, unspecified: Secondary | ICD-10-CM | POA: Diagnosis not present

## 2019-02-15 DIAGNOSIS — N186 End stage renal disease: Secondary | ICD-10-CM | POA: Diagnosis not present

## 2019-02-15 DIAGNOSIS — D509 Iron deficiency anemia, unspecified: Secondary | ICD-10-CM | POA: Diagnosis not present

## 2019-02-15 DIAGNOSIS — D689 Coagulation defect, unspecified: Secondary | ICD-10-CM | POA: Diagnosis not present

## 2019-02-15 DIAGNOSIS — D631 Anemia in chronic kidney disease: Secondary | ICD-10-CM | POA: Diagnosis not present

## 2019-02-15 DIAGNOSIS — E039 Hypothyroidism, unspecified: Secondary | ICD-10-CM | POA: Diagnosis not present

## 2019-02-15 DIAGNOSIS — Z992 Dependence on renal dialysis: Secondary | ICD-10-CM | POA: Diagnosis not present

## 2019-02-15 DIAGNOSIS — N2581 Secondary hyperparathyroidism of renal origin: Secondary | ICD-10-CM | POA: Diagnosis not present

## 2019-02-17 DIAGNOSIS — E039 Hypothyroidism, unspecified: Secondary | ICD-10-CM | POA: Diagnosis not present

## 2019-02-17 DIAGNOSIS — N186 End stage renal disease: Secondary | ICD-10-CM | POA: Diagnosis not present

## 2019-02-17 DIAGNOSIS — Z992 Dependence on renal dialysis: Secondary | ICD-10-CM | POA: Diagnosis not present

## 2019-02-17 DIAGNOSIS — D631 Anemia in chronic kidney disease: Secondary | ICD-10-CM | POA: Diagnosis not present

## 2019-02-17 DIAGNOSIS — D689 Coagulation defect, unspecified: Secondary | ICD-10-CM | POA: Diagnosis not present

## 2019-02-17 DIAGNOSIS — D509 Iron deficiency anemia, unspecified: Secondary | ICD-10-CM | POA: Diagnosis not present

## 2019-02-17 DIAGNOSIS — N2581 Secondary hyperparathyroidism of renal origin: Secondary | ICD-10-CM | POA: Diagnosis not present

## 2019-02-19 DIAGNOSIS — D631 Anemia in chronic kidney disease: Secondary | ICD-10-CM | POA: Diagnosis not present

## 2019-02-19 DIAGNOSIS — D689 Coagulation defect, unspecified: Secondary | ICD-10-CM | POA: Diagnosis not present

## 2019-02-19 DIAGNOSIS — N2581 Secondary hyperparathyroidism of renal origin: Secondary | ICD-10-CM | POA: Diagnosis not present

## 2019-02-19 DIAGNOSIS — N186 End stage renal disease: Secondary | ICD-10-CM | POA: Diagnosis not present

## 2019-02-19 DIAGNOSIS — E039 Hypothyroidism, unspecified: Secondary | ICD-10-CM | POA: Diagnosis not present

## 2019-02-19 DIAGNOSIS — D509 Iron deficiency anemia, unspecified: Secondary | ICD-10-CM | POA: Diagnosis not present

## 2019-02-19 DIAGNOSIS — Z992 Dependence on renal dialysis: Secondary | ICD-10-CM | POA: Diagnosis not present

## 2019-02-21 DIAGNOSIS — N2581 Secondary hyperparathyroidism of renal origin: Secondary | ICD-10-CM | POA: Diagnosis not present

## 2019-02-21 DIAGNOSIS — D631 Anemia in chronic kidney disease: Secondary | ICD-10-CM | POA: Diagnosis not present

## 2019-02-21 DIAGNOSIS — D509 Iron deficiency anemia, unspecified: Secondary | ICD-10-CM | POA: Diagnosis not present

## 2019-02-21 DIAGNOSIS — E039 Hypothyroidism, unspecified: Secondary | ICD-10-CM | POA: Diagnosis not present

## 2019-02-21 DIAGNOSIS — N186 End stage renal disease: Secondary | ICD-10-CM | POA: Diagnosis not present

## 2019-02-21 DIAGNOSIS — Z992 Dependence on renal dialysis: Secondary | ICD-10-CM | POA: Diagnosis not present

## 2019-02-21 DIAGNOSIS — D689 Coagulation defect, unspecified: Secondary | ICD-10-CM | POA: Diagnosis not present

## 2019-02-23 DIAGNOSIS — N2581 Secondary hyperparathyroidism of renal origin: Secondary | ICD-10-CM | POA: Diagnosis not present

## 2019-02-23 DIAGNOSIS — D631 Anemia in chronic kidney disease: Secondary | ICD-10-CM | POA: Diagnosis not present

## 2019-02-23 DIAGNOSIS — N186 End stage renal disease: Secondary | ICD-10-CM | POA: Diagnosis not present

## 2019-02-23 DIAGNOSIS — D689 Coagulation defect, unspecified: Secondary | ICD-10-CM | POA: Diagnosis not present

## 2019-02-23 DIAGNOSIS — Z992 Dependence on renal dialysis: Secondary | ICD-10-CM | POA: Diagnosis not present

## 2019-02-23 DIAGNOSIS — D509 Iron deficiency anemia, unspecified: Secondary | ICD-10-CM | POA: Diagnosis not present

## 2019-02-23 DIAGNOSIS — E039 Hypothyroidism, unspecified: Secondary | ICD-10-CM | POA: Diagnosis not present

## 2019-02-26 DIAGNOSIS — N2581 Secondary hyperparathyroidism of renal origin: Secondary | ICD-10-CM | POA: Diagnosis not present

## 2019-02-26 DIAGNOSIS — D631 Anemia in chronic kidney disease: Secondary | ICD-10-CM | POA: Diagnosis not present

## 2019-02-26 DIAGNOSIS — Z992 Dependence on renal dialysis: Secondary | ICD-10-CM | POA: Diagnosis not present

## 2019-02-26 DIAGNOSIS — D689 Coagulation defect, unspecified: Secondary | ICD-10-CM | POA: Diagnosis not present

## 2019-02-26 DIAGNOSIS — D509 Iron deficiency anemia, unspecified: Secondary | ICD-10-CM | POA: Diagnosis not present

## 2019-02-26 DIAGNOSIS — E039 Hypothyroidism, unspecified: Secondary | ICD-10-CM | POA: Diagnosis not present

## 2019-02-26 DIAGNOSIS — N186 End stage renal disease: Secondary | ICD-10-CM | POA: Diagnosis not present

## 2019-03-01 DIAGNOSIS — E1122 Type 2 diabetes mellitus with diabetic chronic kidney disease: Secondary | ICD-10-CM | POA: Diagnosis not present

## 2019-03-01 DIAGNOSIS — Z992 Dependence on renal dialysis: Secondary | ICD-10-CM | POA: Diagnosis not present

## 2019-03-01 DIAGNOSIS — D689 Coagulation defect, unspecified: Secondary | ICD-10-CM | POA: Diagnosis not present

## 2019-03-01 DIAGNOSIS — N186 End stage renal disease: Secondary | ICD-10-CM | POA: Diagnosis not present

## 2019-03-01 DIAGNOSIS — E039 Hypothyroidism, unspecified: Secondary | ICD-10-CM | POA: Diagnosis not present

## 2019-03-01 DIAGNOSIS — N2581 Secondary hyperparathyroidism of renal origin: Secondary | ICD-10-CM | POA: Diagnosis not present

## 2019-03-01 DIAGNOSIS — D509 Iron deficiency anemia, unspecified: Secondary | ICD-10-CM | POA: Diagnosis not present

## 2019-03-01 DIAGNOSIS — D631 Anemia in chronic kidney disease: Secondary | ICD-10-CM | POA: Diagnosis not present

## 2019-03-03 DIAGNOSIS — Z992 Dependence on renal dialysis: Secondary | ICD-10-CM | POA: Diagnosis not present

## 2019-03-03 DIAGNOSIS — D689 Coagulation defect, unspecified: Secondary | ICD-10-CM | POA: Diagnosis not present

## 2019-03-03 DIAGNOSIS — E039 Hypothyroidism, unspecified: Secondary | ICD-10-CM | POA: Diagnosis not present

## 2019-03-03 DIAGNOSIS — N186 End stage renal disease: Secondary | ICD-10-CM | POA: Diagnosis not present

## 2019-03-03 DIAGNOSIS — D631 Anemia in chronic kidney disease: Secondary | ICD-10-CM | POA: Diagnosis not present

## 2019-03-03 DIAGNOSIS — D509 Iron deficiency anemia, unspecified: Secondary | ICD-10-CM | POA: Diagnosis not present

## 2019-03-03 DIAGNOSIS — N2581 Secondary hyperparathyroidism of renal origin: Secondary | ICD-10-CM | POA: Diagnosis not present

## 2019-03-05 DIAGNOSIS — D689 Coagulation defect, unspecified: Secondary | ICD-10-CM | POA: Diagnosis not present

## 2019-03-05 DIAGNOSIS — D631 Anemia in chronic kidney disease: Secondary | ICD-10-CM | POA: Diagnosis not present

## 2019-03-05 DIAGNOSIS — N2581 Secondary hyperparathyroidism of renal origin: Secondary | ICD-10-CM | POA: Diagnosis not present

## 2019-03-05 DIAGNOSIS — Z992 Dependence on renal dialysis: Secondary | ICD-10-CM | POA: Diagnosis not present

## 2019-03-05 DIAGNOSIS — N186 End stage renal disease: Secondary | ICD-10-CM | POA: Diagnosis not present

## 2019-03-05 DIAGNOSIS — E039 Hypothyroidism, unspecified: Secondary | ICD-10-CM | POA: Diagnosis not present

## 2019-03-05 DIAGNOSIS — D509 Iron deficiency anemia, unspecified: Secondary | ICD-10-CM | POA: Diagnosis not present

## 2019-03-08 DIAGNOSIS — E039 Hypothyroidism, unspecified: Secondary | ICD-10-CM | POA: Diagnosis not present

## 2019-03-08 DIAGNOSIS — D631 Anemia in chronic kidney disease: Secondary | ICD-10-CM | POA: Diagnosis not present

## 2019-03-08 DIAGNOSIS — N2581 Secondary hyperparathyroidism of renal origin: Secondary | ICD-10-CM | POA: Diagnosis not present

## 2019-03-08 DIAGNOSIS — Z992 Dependence on renal dialysis: Secondary | ICD-10-CM | POA: Diagnosis not present

## 2019-03-08 DIAGNOSIS — D509 Iron deficiency anemia, unspecified: Secondary | ICD-10-CM | POA: Diagnosis not present

## 2019-03-08 DIAGNOSIS — D689 Coagulation defect, unspecified: Secondary | ICD-10-CM | POA: Diagnosis not present

## 2019-03-08 DIAGNOSIS — N186 End stage renal disease: Secondary | ICD-10-CM | POA: Diagnosis not present

## 2019-03-10 DIAGNOSIS — N186 End stage renal disease: Secondary | ICD-10-CM | POA: Diagnosis not present

## 2019-03-10 DIAGNOSIS — D509 Iron deficiency anemia, unspecified: Secondary | ICD-10-CM | POA: Diagnosis not present

## 2019-03-10 DIAGNOSIS — D689 Coagulation defect, unspecified: Secondary | ICD-10-CM | POA: Diagnosis not present

## 2019-03-10 DIAGNOSIS — Z992 Dependence on renal dialysis: Secondary | ICD-10-CM | POA: Diagnosis not present

## 2019-03-10 DIAGNOSIS — D631 Anemia in chronic kidney disease: Secondary | ICD-10-CM | POA: Diagnosis not present

## 2019-03-10 DIAGNOSIS — N2581 Secondary hyperparathyroidism of renal origin: Secondary | ICD-10-CM | POA: Diagnosis not present

## 2019-03-10 DIAGNOSIS — E039 Hypothyroidism, unspecified: Secondary | ICD-10-CM | POA: Diagnosis not present

## 2019-03-12 DIAGNOSIS — Z992 Dependence on renal dialysis: Secondary | ICD-10-CM | POA: Diagnosis not present

## 2019-03-12 DIAGNOSIS — E039 Hypothyroidism, unspecified: Secondary | ICD-10-CM | POA: Diagnosis not present

## 2019-03-12 DIAGNOSIS — D689 Coagulation defect, unspecified: Secondary | ICD-10-CM | POA: Diagnosis not present

## 2019-03-12 DIAGNOSIS — N2581 Secondary hyperparathyroidism of renal origin: Secondary | ICD-10-CM | POA: Diagnosis not present

## 2019-03-12 DIAGNOSIS — D631 Anemia in chronic kidney disease: Secondary | ICD-10-CM | POA: Diagnosis not present

## 2019-03-12 DIAGNOSIS — N186 End stage renal disease: Secondary | ICD-10-CM | POA: Diagnosis not present

## 2019-03-12 DIAGNOSIS — D509 Iron deficiency anemia, unspecified: Secondary | ICD-10-CM | POA: Diagnosis not present

## 2019-03-15 DIAGNOSIS — D631 Anemia in chronic kidney disease: Secondary | ICD-10-CM | POA: Diagnosis not present

## 2019-03-15 DIAGNOSIS — N2581 Secondary hyperparathyroidism of renal origin: Secondary | ICD-10-CM | POA: Diagnosis not present

## 2019-03-15 DIAGNOSIS — Z992 Dependence on renal dialysis: Secondary | ICD-10-CM | POA: Diagnosis not present

## 2019-03-15 DIAGNOSIS — N186 End stage renal disease: Secondary | ICD-10-CM | POA: Diagnosis not present

## 2019-03-15 DIAGNOSIS — D509 Iron deficiency anemia, unspecified: Secondary | ICD-10-CM | POA: Diagnosis not present

## 2019-03-15 DIAGNOSIS — D689 Coagulation defect, unspecified: Secondary | ICD-10-CM | POA: Diagnosis not present

## 2019-03-15 DIAGNOSIS — E039 Hypothyroidism, unspecified: Secondary | ICD-10-CM | POA: Diagnosis not present

## 2019-03-17 DIAGNOSIS — D631 Anemia in chronic kidney disease: Secondary | ICD-10-CM | POA: Diagnosis not present

## 2019-03-17 DIAGNOSIS — D509 Iron deficiency anemia, unspecified: Secondary | ICD-10-CM | POA: Diagnosis not present

## 2019-03-17 DIAGNOSIS — Z992 Dependence on renal dialysis: Secondary | ICD-10-CM | POA: Diagnosis not present

## 2019-03-17 DIAGNOSIS — E039 Hypothyroidism, unspecified: Secondary | ICD-10-CM | POA: Diagnosis not present

## 2019-03-17 DIAGNOSIS — N2581 Secondary hyperparathyroidism of renal origin: Secondary | ICD-10-CM | POA: Diagnosis not present

## 2019-03-17 DIAGNOSIS — D689 Coagulation defect, unspecified: Secondary | ICD-10-CM | POA: Diagnosis not present

## 2019-03-17 DIAGNOSIS — N186 End stage renal disease: Secondary | ICD-10-CM | POA: Diagnosis not present

## 2019-03-19 DIAGNOSIS — N186 End stage renal disease: Secondary | ICD-10-CM | POA: Diagnosis not present

## 2019-03-19 DIAGNOSIS — N2581 Secondary hyperparathyroidism of renal origin: Secondary | ICD-10-CM | POA: Diagnosis not present

## 2019-03-19 DIAGNOSIS — D509 Iron deficiency anemia, unspecified: Secondary | ICD-10-CM | POA: Diagnosis not present

## 2019-03-19 DIAGNOSIS — D631 Anemia in chronic kidney disease: Secondary | ICD-10-CM | POA: Diagnosis not present

## 2019-03-19 DIAGNOSIS — D689 Coagulation defect, unspecified: Secondary | ICD-10-CM | POA: Diagnosis not present

## 2019-03-19 DIAGNOSIS — E039 Hypothyroidism, unspecified: Secondary | ICD-10-CM | POA: Diagnosis not present

## 2019-03-19 DIAGNOSIS — Z992 Dependence on renal dialysis: Secondary | ICD-10-CM | POA: Diagnosis not present

## 2019-03-22 DIAGNOSIS — N186 End stage renal disease: Secondary | ICD-10-CM | POA: Diagnosis not present

## 2019-03-22 DIAGNOSIS — D509 Iron deficiency anemia, unspecified: Secondary | ICD-10-CM | POA: Diagnosis not present

## 2019-03-22 DIAGNOSIS — E039 Hypothyroidism, unspecified: Secondary | ICD-10-CM | POA: Diagnosis not present

## 2019-03-22 DIAGNOSIS — Z992 Dependence on renal dialysis: Secondary | ICD-10-CM | POA: Diagnosis not present

## 2019-03-22 DIAGNOSIS — N2581 Secondary hyperparathyroidism of renal origin: Secondary | ICD-10-CM | POA: Diagnosis not present

## 2019-03-22 DIAGNOSIS — D631 Anemia in chronic kidney disease: Secondary | ICD-10-CM | POA: Diagnosis not present

## 2019-03-22 DIAGNOSIS — D689 Coagulation defect, unspecified: Secondary | ICD-10-CM | POA: Diagnosis not present

## 2019-03-23 DIAGNOSIS — T82898A Other specified complication of vascular prosthetic devices, implants and grafts, initial encounter: Secondary | ICD-10-CM | POA: Diagnosis not present

## 2019-03-23 DIAGNOSIS — Z992 Dependence on renal dialysis: Secondary | ICD-10-CM | POA: Diagnosis not present

## 2019-03-23 DIAGNOSIS — N186 End stage renal disease: Secondary | ICD-10-CM | POA: Diagnosis not present

## 2019-03-24 DIAGNOSIS — D509 Iron deficiency anemia, unspecified: Secondary | ICD-10-CM | POA: Diagnosis not present

## 2019-03-24 DIAGNOSIS — N2581 Secondary hyperparathyroidism of renal origin: Secondary | ICD-10-CM | POA: Diagnosis not present

## 2019-03-24 DIAGNOSIS — D631 Anemia in chronic kidney disease: Secondary | ICD-10-CM | POA: Diagnosis not present

## 2019-03-24 DIAGNOSIS — E039 Hypothyroidism, unspecified: Secondary | ICD-10-CM | POA: Diagnosis not present

## 2019-03-24 DIAGNOSIS — D689 Coagulation defect, unspecified: Secondary | ICD-10-CM | POA: Diagnosis not present

## 2019-03-24 DIAGNOSIS — Z992 Dependence on renal dialysis: Secondary | ICD-10-CM | POA: Diagnosis not present

## 2019-03-24 DIAGNOSIS — N186 End stage renal disease: Secondary | ICD-10-CM | POA: Diagnosis not present

## 2019-03-27 DIAGNOSIS — D631 Anemia in chronic kidney disease: Secondary | ICD-10-CM | POA: Diagnosis not present

## 2019-03-27 DIAGNOSIS — N2581 Secondary hyperparathyroidism of renal origin: Secondary | ICD-10-CM | POA: Diagnosis not present

## 2019-03-27 DIAGNOSIS — N186 End stage renal disease: Secondary | ICD-10-CM | POA: Diagnosis not present

## 2019-03-27 DIAGNOSIS — E039 Hypothyroidism, unspecified: Secondary | ICD-10-CM | POA: Diagnosis not present

## 2019-03-27 DIAGNOSIS — Z992 Dependence on renal dialysis: Secondary | ICD-10-CM | POA: Diagnosis not present

## 2019-03-27 DIAGNOSIS — D509 Iron deficiency anemia, unspecified: Secondary | ICD-10-CM | POA: Diagnosis not present

## 2019-03-27 DIAGNOSIS — D689 Coagulation defect, unspecified: Secondary | ICD-10-CM | POA: Diagnosis not present

## 2019-03-29 DIAGNOSIS — N2581 Secondary hyperparathyroidism of renal origin: Secondary | ICD-10-CM | POA: Diagnosis not present

## 2019-03-29 DIAGNOSIS — E039 Hypothyroidism, unspecified: Secondary | ICD-10-CM | POA: Diagnosis not present

## 2019-03-29 DIAGNOSIS — Z992 Dependence on renal dialysis: Secondary | ICD-10-CM | POA: Diagnosis not present

## 2019-03-29 DIAGNOSIS — D631 Anemia in chronic kidney disease: Secondary | ICD-10-CM | POA: Diagnosis not present

## 2019-03-29 DIAGNOSIS — D689 Coagulation defect, unspecified: Secondary | ICD-10-CM | POA: Diagnosis not present

## 2019-03-29 DIAGNOSIS — D509 Iron deficiency anemia, unspecified: Secondary | ICD-10-CM | POA: Diagnosis not present

## 2019-03-29 DIAGNOSIS — N186 End stage renal disease: Secondary | ICD-10-CM | POA: Diagnosis not present

## 2019-03-31 DIAGNOSIS — N2581 Secondary hyperparathyroidism of renal origin: Secondary | ICD-10-CM | POA: Diagnosis not present

## 2019-03-31 DIAGNOSIS — D689 Coagulation defect, unspecified: Secondary | ICD-10-CM | POA: Diagnosis not present

## 2019-03-31 DIAGNOSIS — Z992 Dependence on renal dialysis: Secondary | ICD-10-CM | POA: Diagnosis not present

## 2019-03-31 DIAGNOSIS — D631 Anemia in chronic kidney disease: Secondary | ICD-10-CM | POA: Diagnosis not present

## 2019-03-31 DIAGNOSIS — E039 Hypothyroidism, unspecified: Secondary | ICD-10-CM | POA: Diagnosis not present

## 2019-03-31 DIAGNOSIS — D509 Iron deficiency anemia, unspecified: Secondary | ICD-10-CM | POA: Diagnosis not present

## 2019-03-31 DIAGNOSIS — N186 End stage renal disease: Secondary | ICD-10-CM | POA: Diagnosis not present

## 2019-04-01 DIAGNOSIS — E1122 Type 2 diabetes mellitus with diabetic chronic kidney disease: Secondary | ICD-10-CM | POA: Diagnosis not present

## 2019-04-01 DIAGNOSIS — N186 End stage renal disease: Secondary | ICD-10-CM | POA: Diagnosis not present

## 2019-04-01 DIAGNOSIS — Z992 Dependence on renal dialysis: Secondary | ICD-10-CM | POA: Diagnosis not present

## 2019-04-03 DIAGNOSIS — E1122 Type 2 diabetes mellitus with diabetic chronic kidney disease: Secondary | ICD-10-CM | POA: Diagnosis not present

## 2019-04-03 DIAGNOSIS — Z992 Dependence on renal dialysis: Secondary | ICD-10-CM | POA: Diagnosis not present

## 2019-04-03 DIAGNOSIS — D509 Iron deficiency anemia, unspecified: Secondary | ICD-10-CM | POA: Diagnosis not present

## 2019-04-03 DIAGNOSIS — D631 Anemia in chronic kidney disease: Secondary | ICD-10-CM | POA: Diagnosis not present

## 2019-04-03 DIAGNOSIS — N186 End stage renal disease: Secondary | ICD-10-CM | POA: Diagnosis not present

## 2019-04-03 DIAGNOSIS — D689 Coagulation defect, unspecified: Secondary | ICD-10-CM | POA: Diagnosis not present

## 2019-04-03 DIAGNOSIS — E039 Hypothyroidism, unspecified: Secondary | ICD-10-CM | POA: Diagnosis not present

## 2019-04-03 DIAGNOSIS — N2581 Secondary hyperparathyroidism of renal origin: Secondary | ICD-10-CM | POA: Diagnosis not present

## 2019-04-07 DIAGNOSIS — E039 Hypothyroidism, unspecified: Secondary | ICD-10-CM | POA: Diagnosis not present

## 2019-04-07 DIAGNOSIS — Z992 Dependence on renal dialysis: Secondary | ICD-10-CM | POA: Diagnosis not present

## 2019-04-07 DIAGNOSIS — N186 End stage renal disease: Secondary | ICD-10-CM | POA: Diagnosis not present

## 2019-04-07 DIAGNOSIS — E1122 Type 2 diabetes mellitus with diabetic chronic kidney disease: Secondary | ICD-10-CM | POA: Diagnosis not present

## 2019-04-07 DIAGNOSIS — N2581 Secondary hyperparathyroidism of renal origin: Secondary | ICD-10-CM | POA: Diagnosis not present

## 2019-04-07 DIAGNOSIS — D631 Anemia in chronic kidney disease: Secondary | ICD-10-CM | POA: Diagnosis not present

## 2019-04-07 DIAGNOSIS — D509 Iron deficiency anemia, unspecified: Secondary | ICD-10-CM | POA: Diagnosis not present

## 2019-04-07 DIAGNOSIS — D689 Coagulation defect, unspecified: Secondary | ICD-10-CM | POA: Diagnosis not present

## 2019-04-09 DIAGNOSIS — D631 Anemia in chronic kidney disease: Secondary | ICD-10-CM | POA: Diagnosis not present

## 2019-04-09 DIAGNOSIS — D689 Coagulation defect, unspecified: Secondary | ICD-10-CM | POA: Diagnosis not present

## 2019-04-09 DIAGNOSIS — E1122 Type 2 diabetes mellitus with diabetic chronic kidney disease: Secondary | ICD-10-CM | POA: Diagnosis not present

## 2019-04-09 DIAGNOSIS — N186 End stage renal disease: Secondary | ICD-10-CM | POA: Diagnosis not present

## 2019-04-09 DIAGNOSIS — N2581 Secondary hyperparathyroidism of renal origin: Secondary | ICD-10-CM | POA: Diagnosis not present

## 2019-04-09 DIAGNOSIS — Z992 Dependence on renal dialysis: Secondary | ICD-10-CM | POA: Diagnosis not present

## 2019-04-09 DIAGNOSIS — E039 Hypothyroidism, unspecified: Secondary | ICD-10-CM | POA: Diagnosis not present

## 2019-04-09 DIAGNOSIS — D509 Iron deficiency anemia, unspecified: Secondary | ICD-10-CM | POA: Diagnosis not present

## 2019-04-12 DIAGNOSIS — E1122 Type 2 diabetes mellitus with diabetic chronic kidney disease: Secondary | ICD-10-CM | POA: Diagnosis not present

## 2019-04-12 DIAGNOSIS — N186 End stage renal disease: Secondary | ICD-10-CM | POA: Diagnosis not present

## 2019-04-12 DIAGNOSIS — D631 Anemia in chronic kidney disease: Secondary | ICD-10-CM | POA: Diagnosis not present

## 2019-04-12 DIAGNOSIS — N2581 Secondary hyperparathyroidism of renal origin: Secondary | ICD-10-CM | POA: Diagnosis not present

## 2019-04-12 DIAGNOSIS — D689 Coagulation defect, unspecified: Secondary | ICD-10-CM | POA: Diagnosis not present

## 2019-04-12 DIAGNOSIS — Z992 Dependence on renal dialysis: Secondary | ICD-10-CM | POA: Diagnosis not present

## 2019-04-12 DIAGNOSIS — E039 Hypothyroidism, unspecified: Secondary | ICD-10-CM | POA: Diagnosis not present

## 2019-04-12 DIAGNOSIS — D509 Iron deficiency anemia, unspecified: Secondary | ICD-10-CM | POA: Diagnosis not present

## 2019-04-14 DIAGNOSIS — D689 Coagulation defect, unspecified: Secondary | ICD-10-CM | POA: Diagnosis not present

## 2019-04-14 DIAGNOSIS — N2581 Secondary hyperparathyroidism of renal origin: Secondary | ICD-10-CM | POA: Diagnosis not present

## 2019-04-14 DIAGNOSIS — E039 Hypothyroidism, unspecified: Secondary | ICD-10-CM | POA: Diagnosis not present

## 2019-04-14 DIAGNOSIS — D631 Anemia in chronic kidney disease: Secondary | ICD-10-CM | POA: Diagnosis not present

## 2019-04-14 DIAGNOSIS — D509 Iron deficiency anemia, unspecified: Secondary | ICD-10-CM | POA: Diagnosis not present

## 2019-04-14 DIAGNOSIS — Z992 Dependence on renal dialysis: Secondary | ICD-10-CM | POA: Diagnosis not present

## 2019-04-14 DIAGNOSIS — E1122 Type 2 diabetes mellitus with diabetic chronic kidney disease: Secondary | ICD-10-CM | POA: Diagnosis not present

## 2019-04-14 DIAGNOSIS — N186 End stage renal disease: Secondary | ICD-10-CM | POA: Diagnosis not present

## 2019-04-16 DIAGNOSIS — Z992 Dependence on renal dialysis: Secondary | ICD-10-CM | POA: Diagnosis not present

## 2019-04-16 DIAGNOSIS — N186 End stage renal disease: Secondary | ICD-10-CM | POA: Diagnosis not present

## 2019-04-16 DIAGNOSIS — N2581 Secondary hyperparathyroidism of renal origin: Secondary | ICD-10-CM | POA: Diagnosis not present

## 2019-04-16 DIAGNOSIS — D509 Iron deficiency anemia, unspecified: Secondary | ICD-10-CM | POA: Diagnosis not present

## 2019-04-16 DIAGNOSIS — D631 Anemia in chronic kidney disease: Secondary | ICD-10-CM | POA: Diagnosis not present

## 2019-04-16 DIAGNOSIS — E039 Hypothyroidism, unspecified: Secondary | ICD-10-CM | POA: Diagnosis not present

## 2019-04-16 DIAGNOSIS — E1122 Type 2 diabetes mellitus with diabetic chronic kidney disease: Secondary | ICD-10-CM | POA: Diagnosis not present

## 2019-04-16 DIAGNOSIS — D689 Coagulation defect, unspecified: Secondary | ICD-10-CM | POA: Diagnosis not present

## 2019-04-19 DIAGNOSIS — D631 Anemia in chronic kidney disease: Secondary | ICD-10-CM | POA: Diagnosis not present

## 2019-04-19 DIAGNOSIS — D689 Coagulation defect, unspecified: Secondary | ICD-10-CM | POA: Diagnosis not present

## 2019-04-19 DIAGNOSIS — N2581 Secondary hyperparathyroidism of renal origin: Secondary | ICD-10-CM | POA: Diagnosis not present

## 2019-04-19 DIAGNOSIS — D509 Iron deficiency anemia, unspecified: Secondary | ICD-10-CM | POA: Diagnosis not present

## 2019-04-19 DIAGNOSIS — N186 End stage renal disease: Secondary | ICD-10-CM | POA: Diagnosis not present

## 2019-04-19 DIAGNOSIS — Z992 Dependence on renal dialysis: Secondary | ICD-10-CM | POA: Diagnosis not present

## 2019-04-19 DIAGNOSIS — E039 Hypothyroidism, unspecified: Secondary | ICD-10-CM | POA: Diagnosis not present

## 2019-04-19 DIAGNOSIS — E1122 Type 2 diabetes mellitus with diabetic chronic kidney disease: Secondary | ICD-10-CM | POA: Diagnosis not present

## 2019-04-21 DIAGNOSIS — D689 Coagulation defect, unspecified: Secondary | ICD-10-CM | POA: Diagnosis not present

## 2019-04-21 DIAGNOSIS — N186 End stage renal disease: Secondary | ICD-10-CM | POA: Diagnosis not present

## 2019-04-21 DIAGNOSIS — D509 Iron deficiency anemia, unspecified: Secondary | ICD-10-CM | POA: Diagnosis not present

## 2019-04-21 DIAGNOSIS — E1122 Type 2 diabetes mellitus with diabetic chronic kidney disease: Secondary | ICD-10-CM | POA: Diagnosis not present

## 2019-04-21 DIAGNOSIS — N2581 Secondary hyperparathyroidism of renal origin: Secondary | ICD-10-CM | POA: Diagnosis not present

## 2019-04-21 DIAGNOSIS — D631 Anemia in chronic kidney disease: Secondary | ICD-10-CM | POA: Diagnosis not present

## 2019-04-21 DIAGNOSIS — E039 Hypothyroidism, unspecified: Secondary | ICD-10-CM | POA: Diagnosis not present

## 2019-04-21 DIAGNOSIS — Z992 Dependence on renal dialysis: Secondary | ICD-10-CM | POA: Diagnosis not present

## 2019-04-23 DIAGNOSIS — N186 End stage renal disease: Secondary | ICD-10-CM | POA: Diagnosis not present

## 2019-04-23 DIAGNOSIS — D631 Anemia in chronic kidney disease: Secondary | ICD-10-CM | POA: Diagnosis not present

## 2019-04-23 DIAGNOSIS — Z992 Dependence on renal dialysis: Secondary | ICD-10-CM | POA: Diagnosis not present

## 2019-04-23 DIAGNOSIS — N2581 Secondary hyperparathyroidism of renal origin: Secondary | ICD-10-CM | POA: Diagnosis not present

## 2019-04-23 DIAGNOSIS — E1122 Type 2 diabetes mellitus with diabetic chronic kidney disease: Secondary | ICD-10-CM | POA: Diagnosis not present

## 2019-04-23 DIAGNOSIS — D509 Iron deficiency anemia, unspecified: Secondary | ICD-10-CM | POA: Diagnosis not present

## 2019-04-23 DIAGNOSIS — E039 Hypothyroidism, unspecified: Secondary | ICD-10-CM | POA: Diagnosis not present

## 2019-04-23 DIAGNOSIS — D689 Coagulation defect, unspecified: Secondary | ICD-10-CM | POA: Diagnosis not present

## 2019-04-26 DIAGNOSIS — N186 End stage renal disease: Secondary | ICD-10-CM | POA: Diagnosis not present

## 2019-04-26 DIAGNOSIS — Z992 Dependence on renal dialysis: Secondary | ICD-10-CM | POA: Diagnosis not present

## 2019-04-26 DIAGNOSIS — D689 Coagulation defect, unspecified: Secondary | ICD-10-CM | POA: Diagnosis not present

## 2019-04-26 DIAGNOSIS — E1122 Type 2 diabetes mellitus with diabetic chronic kidney disease: Secondary | ICD-10-CM | POA: Diagnosis not present

## 2019-04-26 DIAGNOSIS — E039 Hypothyroidism, unspecified: Secondary | ICD-10-CM | POA: Diagnosis not present

## 2019-04-26 DIAGNOSIS — D509 Iron deficiency anemia, unspecified: Secondary | ICD-10-CM | POA: Diagnosis not present

## 2019-04-26 DIAGNOSIS — N2581 Secondary hyperparathyroidism of renal origin: Secondary | ICD-10-CM | POA: Diagnosis not present

## 2019-04-26 DIAGNOSIS — D631 Anemia in chronic kidney disease: Secondary | ICD-10-CM | POA: Diagnosis not present

## 2019-04-28 DIAGNOSIS — D509 Iron deficiency anemia, unspecified: Secondary | ICD-10-CM | POA: Diagnosis not present

## 2019-04-28 DIAGNOSIS — N186 End stage renal disease: Secondary | ICD-10-CM | POA: Diagnosis not present

## 2019-04-28 DIAGNOSIS — Z992 Dependence on renal dialysis: Secondary | ICD-10-CM | POA: Diagnosis not present

## 2019-04-28 DIAGNOSIS — D631 Anemia in chronic kidney disease: Secondary | ICD-10-CM | POA: Diagnosis not present

## 2019-04-28 DIAGNOSIS — D689 Coagulation defect, unspecified: Secondary | ICD-10-CM | POA: Diagnosis not present

## 2019-04-28 DIAGNOSIS — E1122 Type 2 diabetes mellitus with diabetic chronic kidney disease: Secondary | ICD-10-CM | POA: Diagnosis not present

## 2019-04-28 DIAGNOSIS — E039 Hypothyroidism, unspecified: Secondary | ICD-10-CM | POA: Diagnosis not present

## 2019-04-28 DIAGNOSIS — N2581 Secondary hyperparathyroidism of renal origin: Secondary | ICD-10-CM | POA: Diagnosis not present

## 2019-04-30 DIAGNOSIS — D631 Anemia in chronic kidney disease: Secondary | ICD-10-CM | POA: Diagnosis not present

## 2019-04-30 DIAGNOSIS — N186 End stage renal disease: Secondary | ICD-10-CM | POA: Diagnosis not present

## 2019-04-30 DIAGNOSIS — D509 Iron deficiency anemia, unspecified: Secondary | ICD-10-CM | POA: Diagnosis not present

## 2019-04-30 DIAGNOSIS — E1122 Type 2 diabetes mellitus with diabetic chronic kidney disease: Secondary | ICD-10-CM | POA: Diagnosis not present

## 2019-04-30 DIAGNOSIS — Z992 Dependence on renal dialysis: Secondary | ICD-10-CM | POA: Diagnosis not present

## 2019-04-30 DIAGNOSIS — N2581 Secondary hyperparathyroidism of renal origin: Secondary | ICD-10-CM | POA: Diagnosis not present

## 2019-04-30 DIAGNOSIS — D689 Coagulation defect, unspecified: Secondary | ICD-10-CM | POA: Diagnosis not present

## 2019-04-30 DIAGNOSIS — E039 Hypothyroidism, unspecified: Secondary | ICD-10-CM | POA: Diagnosis not present

## 2019-05-02 DIAGNOSIS — N186 End stage renal disease: Secondary | ICD-10-CM | POA: Diagnosis not present

## 2019-05-02 DIAGNOSIS — E1122 Type 2 diabetes mellitus with diabetic chronic kidney disease: Secondary | ICD-10-CM | POA: Diagnosis not present

## 2019-05-02 DIAGNOSIS — Z992 Dependence on renal dialysis: Secondary | ICD-10-CM | POA: Diagnosis not present

## 2019-05-03 DIAGNOSIS — E039 Hypothyroidism, unspecified: Secondary | ICD-10-CM | POA: Diagnosis not present

## 2019-05-03 DIAGNOSIS — N186 End stage renal disease: Secondary | ICD-10-CM | POA: Diagnosis not present

## 2019-05-03 DIAGNOSIS — D631 Anemia in chronic kidney disease: Secondary | ICD-10-CM | POA: Diagnosis not present

## 2019-05-03 DIAGNOSIS — D509 Iron deficiency anemia, unspecified: Secondary | ICD-10-CM | POA: Diagnosis not present

## 2019-05-03 DIAGNOSIS — Z992 Dependence on renal dialysis: Secondary | ICD-10-CM | POA: Diagnosis not present

## 2019-05-03 DIAGNOSIS — N2581 Secondary hyperparathyroidism of renal origin: Secondary | ICD-10-CM | POA: Diagnosis not present

## 2019-05-03 DIAGNOSIS — D689 Coagulation defect, unspecified: Secondary | ICD-10-CM | POA: Diagnosis not present

## 2019-05-05 DIAGNOSIS — Z992 Dependence on renal dialysis: Secondary | ICD-10-CM | POA: Diagnosis not present

## 2019-05-05 DIAGNOSIS — D689 Coagulation defect, unspecified: Secondary | ICD-10-CM | POA: Diagnosis not present

## 2019-05-05 DIAGNOSIS — D509 Iron deficiency anemia, unspecified: Secondary | ICD-10-CM | POA: Diagnosis not present

## 2019-05-05 DIAGNOSIS — D631 Anemia in chronic kidney disease: Secondary | ICD-10-CM | POA: Diagnosis not present

## 2019-05-05 DIAGNOSIS — E039 Hypothyroidism, unspecified: Secondary | ICD-10-CM | POA: Diagnosis not present

## 2019-05-05 DIAGNOSIS — N186 End stage renal disease: Secondary | ICD-10-CM | POA: Diagnosis not present

## 2019-05-05 DIAGNOSIS — N2581 Secondary hyperparathyroidism of renal origin: Secondary | ICD-10-CM | POA: Diagnosis not present

## 2019-05-07 DIAGNOSIS — N186 End stage renal disease: Secondary | ICD-10-CM | POA: Diagnosis not present

## 2019-05-07 DIAGNOSIS — D631 Anemia in chronic kidney disease: Secondary | ICD-10-CM | POA: Diagnosis not present

## 2019-05-07 DIAGNOSIS — E039 Hypothyroidism, unspecified: Secondary | ICD-10-CM | POA: Diagnosis not present

## 2019-05-07 DIAGNOSIS — Z992 Dependence on renal dialysis: Secondary | ICD-10-CM | POA: Diagnosis not present

## 2019-05-07 DIAGNOSIS — D689 Coagulation defect, unspecified: Secondary | ICD-10-CM | POA: Diagnosis not present

## 2019-05-07 DIAGNOSIS — D509 Iron deficiency anemia, unspecified: Secondary | ICD-10-CM | POA: Diagnosis not present

## 2019-05-07 DIAGNOSIS — N2581 Secondary hyperparathyroidism of renal origin: Secondary | ICD-10-CM | POA: Diagnosis not present

## 2019-05-10 DIAGNOSIS — D509 Iron deficiency anemia, unspecified: Secondary | ICD-10-CM | POA: Diagnosis not present

## 2019-05-10 DIAGNOSIS — E039 Hypothyroidism, unspecified: Secondary | ICD-10-CM | POA: Diagnosis not present

## 2019-05-10 DIAGNOSIS — N2581 Secondary hyperparathyroidism of renal origin: Secondary | ICD-10-CM | POA: Diagnosis not present

## 2019-05-10 DIAGNOSIS — N186 End stage renal disease: Secondary | ICD-10-CM | POA: Diagnosis not present

## 2019-05-10 DIAGNOSIS — D689 Coagulation defect, unspecified: Secondary | ICD-10-CM | POA: Diagnosis not present

## 2019-05-10 DIAGNOSIS — D631 Anemia in chronic kidney disease: Secondary | ICD-10-CM | POA: Diagnosis not present

## 2019-05-10 DIAGNOSIS — Z992 Dependence on renal dialysis: Secondary | ICD-10-CM | POA: Diagnosis not present

## 2019-05-12 DIAGNOSIS — D509 Iron deficiency anemia, unspecified: Secondary | ICD-10-CM | POA: Diagnosis not present

## 2019-05-12 DIAGNOSIS — D689 Coagulation defect, unspecified: Secondary | ICD-10-CM | POA: Diagnosis not present

## 2019-05-12 DIAGNOSIS — N2581 Secondary hyperparathyroidism of renal origin: Secondary | ICD-10-CM | POA: Diagnosis not present

## 2019-05-12 DIAGNOSIS — D631 Anemia in chronic kidney disease: Secondary | ICD-10-CM | POA: Diagnosis not present

## 2019-05-12 DIAGNOSIS — Z992 Dependence on renal dialysis: Secondary | ICD-10-CM | POA: Diagnosis not present

## 2019-05-12 DIAGNOSIS — E039 Hypothyroidism, unspecified: Secondary | ICD-10-CM | POA: Diagnosis not present

## 2019-05-12 DIAGNOSIS — N186 End stage renal disease: Secondary | ICD-10-CM | POA: Diagnosis not present

## 2019-05-14 DIAGNOSIS — D631 Anemia in chronic kidney disease: Secondary | ICD-10-CM | POA: Diagnosis not present

## 2019-05-14 DIAGNOSIS — N2581 Secondary hyperparathyroidism of renal origin: Secondary | ICD-10-CM | POA: Diagnosis not present

## 2019-05-14 DIAGNOSIS — D509 Iron deficiency anemia, unspecified: Secondary | ICD-10-CM | POA: Diagnosis not present

## 2019-05-14 DIAGNOSIS — N186 End stage renal disease: Secondary | ICD-10-CM | POA: Diagnosis not present

## 2019-05-14 DIAGNOSIS — D689 Coagulation defect, unspecified: Secondary | ICD-10-CM | POA: Diagnosis not present

## 2019-05-14 DIAGNOSIS — Z992 Dependence on renal dialysis: Secondary | ICD-10-CM | POA: Diagnosis not present

## 2019-05-14 DIAGNOSIS — E039 Hypothyroidism, unspecified: Secondary | ICD-10-CM | POA: Diagnosis not present

## 2019-05-17 DIAGNOSIS — D631 Anemia in chronic kidney disease: Secondary | ICD-10-CM | POA: Diagnosis not present

## 2019-05-17 DIAGNOSIS — D689 Coagulation defect, unspecified: Secondary | ICD-10-CM | POA: Diagnosis not present

## 2019-05-17 DIAGNOSIS — D509 Iron deficiency anemia, unspecified: Secondary | ICD-10-CM | POA: Diagnosis not present

## 2019-05-17 DIAGNOSIS — N186 End stage renal disease: Secondary | ICD-10-CM | POA: Diagnosis not present

## 2019-05-17 DIAGNOSIS — Z992 Dependence on renal dialysis: Secondary | ICD-10-CM | POA: Diagnosis not present

## 2019-05-17 DIAGNOSIS — N2581 Secondary hyperparathyroidism of renal origin: Secondary | ICD-10-CM | POA: Diagnosis not present

## 2019-05-17 DIAGNOSIS — E039 Hypothyroidism, unspecified: Secondary | ICD-10-CM | POA: Diagnosis not present

## 2019-05-19 DIAGNOSIS — E039 Hypothyroidism, unspecified: Secondary | ICD-10-CM | POA: Diagnosis not present

## 2019-05-19 DIAGNOSIS — Z992 Dependence on renal dialysis: Secondary | ICD-10-CM | POA: Diagnosis not present

## 2019-05-19 DIAGNOSIS — N2581 Secondary hyperparathyroidism of renal origin: Secondary | ICD-10-CM | POA: Diagnosis not present

## 2019-05-19 DIAGNOSIS — D689 Coagulation defect, unspecified: Secondary | ICD-10-CM | POA: Diagnosis not present

## 2019-05-19 DIAGNOSIS — N186 End stage renal disease: Secondary | ICD-10-CM | POA: Diagnosis not present

## 2019-05-19 DIAGNOSIS — D509 Iron deficiency anemia, unspecified: Secondary | ICD-10-CM | POA: Diagnosis not present

## 2019-05-19 DIAGNOSIS — D631 Anemia in chronic kidney disease: Secondary | ICD-10-CM | POA: Diagnosis not present

## 2019-05-21 DIAGNOSIS — Z992 Dependence on renal dialysis: Secondary | ICD-10-CM | POA: Diagnosis not present

## 2019-05-21 DIAGNOSIS — D631 Anemia in chronic kidney disease: Secondary | ICD-10-CM | POA: Diagnosis not present

## 2019-05-21 DIAGNOSIS — D689 Coagulation defect, unspecified: Secondary | ICD-10-CM | POA: Diagnosis not present

## 2019-05-21 DIAGNOSIS — N186 End stage renal disease: Secondary | ICD-10-CM | POA: Diagnosis not present

## 2019-05-21 DIAGNOSIS — E039 Hypothyroidism, unspecified: Secondary | ICD-10-CM | POA: Diagnosis not present

## 2019-05-21 DIAGNOSIS — D509 Iron deficiency anemia, unspecified: Secondary | ICD-10-CM | POA: Diagnosis not present

## 2019-05-21 DIAGNOSIS — N2581 Secondary hyperparathyroidism of renal origin: Secondary | ICD-10-CM | POA: Diagnosis not present

## 2019-05-24 DIAGNOSIS — D689 Coagulation defect, unspecified: Secondary | ICD-10-CM | POA: Diagnosis not present

## 2019-05-24 DIAGNOSIS — D631 Anemia in chronic kidney disease: Secondary | ICD-10-CM | POA: Diagnosis not present

## 2019-05-24 DIAGNOSIS — Z992 Dependence on renal dialysis: Secondary | ICD-10-CM | POA: Diagnosis not present

## 2019-05-24 DIAGNOSIS — N2581 Secondary hyperparathyroidism of renal origin: Secondary | ICD-10-CM | POA: Diagnosis not present

## 2019-05-24 DIAGNOSIS — N186 End stage renal disease: Secondary | ICD-10-CM | POA: Diagnosis not present

## 2019-05-24 DIAGNOSIS — E039 Hypothyroidism, unspecified: Secondary | ICD-10-CM | POA: Diagnosis not present

## 2019-05-24 DIAGNOSIS — D509 Iron deficiency anemia, unspecified: Secondary | ICD-10-CM | POA: Diagnosis not present

## 2019-05-26 DIAGNOSIS — D689 Coagulation defect, unspecified: Secondary | ICD-10-CM | POA: Diagnosis not present

## 2019-05-26 DIAGNOSIS — D509 Iron deficiency anemia, unspecified: Secondary | ICD-10-CM | POA: Diagnosis not present

## 2019-05-26 DIAGNOSIS — E039 Hypothyroidism, unspecified: Secondary | ICD-10-CM | POA: Diagnosis not present

## 2019-05-26 DIAGNOSIS — N186 End stage renal disease: Secondary | ICD-10-CM | POA: Diagnosis not present

## 2019-05-26 DIAGNOSIS — N2581 Secondary hyperparathyroidism of renal origin: Secondary | ICD-10-CM | POA: Diagnosis not present

## 2019-05-26 DIAGNOSIS — Z992 Dependence on renal dialysis: Secondary | ICD-10-CM | POA: Diagnosis not present

## 2019-05-26 DIAGNOSIS — D631 Anemia in chronic kidney disease: Secondary | ICD-10-CM | POA: Diagnosis not present

## 2019-05-28 DIAGNOSIS — N2581 Secondary hyperparathyroidism of renal origin: Secondary | ICD-10-CM | POA: Diagnosis not present

## 2019-05-28 DIAGNOSIS — N186 End stage renal disease: Secondary | ICD-10-CM | POA: Diagnosis not present

## 2019-05-28 DIAGNOSIS — Z992 Dependence on renal dialysis: Secondary | ICD-10-CM | POA: Diagnosis not present

## 2019-05-28 DIAGNOSIS — D509 Iron deficiency anemia, unspecified: Secondary | ICD-10-CM | POA: Diagnosis not present

## 2019-05-28 DIAGNOSIS — D631 Anemia in chronic kidney disease: Secondary | ICD-10-CM | POA: Diagnosis not present

## 2019-05-28 DIAGNOSIS — D689 Coagulation defect, unspecified: Secondary | ICD-10-CM | POA: Diagnosis not present

## 2019-05-28 DIAGNOSIS — E039 Hypothyroidism, unspecified: Secondary | ICD-10-CM | POA: Diagnosis not present

## 2019-05-30 DIAGNOSIS — N186 End stage renal disease: Secondary | ICD-10-CM | POA: Diagnosis not present

## 2019-05-30 DIAGNOSIS — E1122 Type 2 diabetes mellitus with diabetic chronic kidney disease: Secondary | ICD-10-CM | POA: Diagnosis not present

## 2019-05-30 DIAGNOSIS — Z992 Dependence on renal dialysis: Secondary | ICD-10-CM | POA: Diagnosis not present

## 2019-05-31 DIAGNOSIS — N2581 Secondary hyperparathyroidism of renal origin: Secondary | ICD-10-CM | POA: Diagnosis not present

## 2019-05-31 DIAGNOSIS — D689 Coagulation defect, unspecified: Secondary | ICD-10-CM | POA: Diagnosis not present

## 2019-05-31 DIAGNOSIS — Z992 Dependence on renal dialysis: Secondary | ICD-10-CM | POA: Diagnosis not present

## 2019-05-31 DIAGNOSIS — D631 Anemia in chronic kidney disease: Secondary | ICD-10-CM | POA: Diagnosis not present

## 2019-05-31 DIAGNOSIS — N186 End stage renal disease: Secondary | ICD-10-CM | POA: Diagnosis not present

## 2019-05-31 DIAGNOSIS — D509 Iron deficiency anemia, unspecified: Secondary | ICD-10-CM | POA: Diagnosis not present

## 2019-06-02 DIAGNOSIS — D689 Coagulation defect, unspecified: Secondary | ICD-10-CM | POA: Diagnosis not present

## 2019-06-02 DIAGNOSIS — D509 Iron deficiency anemia, unspecified: Secondary | ICD-10-CM | POA: Diagnosis not present

## 2019-06-02 DIAGNOSIS — N186 End stage renal disease: Secondary | ICD-10-CM | POA: Diagnosis not present

## 2019-06-02 DIAGNOSIS — D631 Anemia in chronic kidney disease: Secondary | ICD-10-CM | POA: Diagnosis not present

## 2019-06-02 DIAGNOSIS — Z992 Dependence on renal dialysis: Secondary | ICD-10-CM | POA: Diagnosis not present

## 2019-06-02 DIAGNOSIS — N2581 Secondary hyperparathyroidism of renal origin: Secondary | ICD-10-CM | POA: Diagnosis not present

## 2019-06-04 DIAGNOSIS — D509 Iron deficiency anemia, unspecified: Secondary | ICD-10-CM | POA: Diagnosis not present

## 2019-06-04 DIAGNOSIS — D631 Anemia in chronic kidney disease: Secondary | ICD-10-CM | POA: Diagnosis not present

## 2019-06-04 DIAGNOSIS — N186 End stage renal disease: Secondary | ICD-10-CM | POA: Diagnosis not present

## 2019-06-04 DIAGNOSIS — Z992 Dependence on renal dialysis: Secondary | ICD-10-CM | POA: Diagnosis not present

## 2019-06-04 DIAGNOSIS — N2581 Secondary hyperparathyroidism of renal origin: Secondary | ICD-10-CM | POA: Diagnosis not present

## 2019-06-04 DIAGNOSIS — D689 Coagulation defect, unspecified: Secondary | ICD-10-CM | POA: Diagnosis not present

## 2019-06-07 DIAGNOSIS — N2581 Secondary hyperparathyroidism of renal origin: Secondary | ICD-10-CM | POA: Diagnosis not present

## 2019-06-07 DIAGNOSIS — D631 Anemia in chronic kidney disease: Secondary | ICD-10-CM | POA: Diagnosis not present

## 2019-06-07 DIAGNOSIS — Z992 Dependence on renal dialysis: Secondary | ICD-10-CM | POA: Diagnosis not present

## 2019-06-07 DIAGNOSIS — D509 Iron deficiency anemia, unspecified: Secondary | ICD-10-CM | POA: Diagnosis not present

## 2019-06-07 DIAGNOSIS — N186 End stage renal disease: Secondary | ICD-10-CM | POA: Diagnosis not present

## 2019-06-07 DIAGNOSIS — D689 Coagulation defect, unspecified: Secondary | ICD-10-CM | POA: Diagnosis not present

## 2019-06-09 ENCOUNTER — Other Ambulatory Visit: Payer: Self-pay

## 2019-06-09 ENCOUNTER — Telehealth (HOSPITAL_COMMUNITY): Payer: Self-pay

## 2019-06-09 DIAGNOSIS — D689 Coagulation defect, unspecified: Secondary | ICD-10-CM | POA: Diagnosis not present

## 2019-06-09 DIAGNOSIS — N2581 Secondary hyperparathyroidism of renal origin: Secondary | ICD-10-CM | POA: Diagnosis not present

## 2019-06-09 DIAGNOSIS — Z992 Dependence on renal dialysis: Secondary | ICD-10-CM | POA: Diagnosis not present

## 2019-06-09 DIAGNOSIS — N186 End stage renal disease: Secondary | ICD-10-CM | POA: Diagnosis not present

## 2019-06-09 DIAGNOSIS — D509 Iron deficiency anemia, unspecified: Secondary | ICD-10-CM | POA: Diagnosis not present

## 2019-06-09 DIAGNOSIS — R200 Anesthesia of skin: Secondary | ICD-10-CM

## 2019-06-09 DIAGNOSIS — D631 Anemia in chronic kidney disease: Secondary | ICD-10-CM | POA: Diagnosis not present

## 2019-06-09 NOTE — Telephone Encounter (Signed)

## 2019-06-10 ENCOUNTER — Ambulatory Visit: Payer: Medicare Other | Admitting: Orthopedic Surgery

## 2019-06-10 ENCOUNTER — Other Ambulatory Visit: Payer: Self-pay

## 2019-06-10 ENCOUNTER — Ambulatory Visit (HOSPITAL_COMMUNITY)
Admission: RE | Admit: 2019-06-10 | Discharge: 2019-06-10 | Disposition: A | Discharge status: Home / Self Care | Payer: Medicare Other | Source: Ambulatory Visit | Attending: Vascular Surgery | Admitting: Vascular Surgery

## 2019-06-10 ENCOUNTER — Encounter (HOSPITAL_COMMUNITY): Payer: Self-pay

## 2019-06-10 ENCOUNTER — Encounter: Payer: Self-pay | Admitting: Orthopedic Surgery

## 2019-06-10 ENCOUNTER — Encounter: Payer: Medicare Other | Admitting: Vascular Surgery

## 2019-06-10 VITALS — Ht 68.0 in | Wt 261.0 lb

## 2019-06-10 DIAGNOSIS — L97919 Non-pressure chronic ulcer of unspecified part of right lower leg with unspecified severity: Secondary | ICD-10-CM | POA: Diagnosis not present

## 2019-06-10 DIAGNOSIS — T8789 Other complications of amputation stump: Secondary | ICD-10-CM

## 2019-06-10 DIAGNOSIS — R200 Anesthesia of skin: Secondary | ICD-10-CM

## 2019-06-10 NOTE — Progress Notes (Signed)
Office Visit Note   Patient: Tim Becker           Date of Birth: 04-23-1965           MRN: [id number] Visit Date: 06/10/2019              Requested by: Leeroy Cha, MD [address] [place],  [place] [address] PCP: Leeroy Cha, MD  Chief Complaint  Patient presents with  . Right Leg - Pain    HX BKA       HPI: Patient is a 54 year old gentleman who presents for initial evaluation for his right transtibial amputation.  He states that surgery was performed in [place].  He states that the prosthesis was initially made by [employer] his second prosthesis is made by [employer] he complains of a painful recurrent ulcer callus over the end of the residual limb.  Patient states he has had multiple revisions of the socket currently wearing 2 silicone sleeves a plastic spacer and has worn multiple ply stocking.  Patient is a diabetic with end-stage renal disease on dialysis Monday Wednesday Friday.  Assessment & Plan: Visit Diagnoses:  1. Non-pressure ulcer of stump of below knee amputation of right lower extremity ([place])     Plan: Discussed with the patient due to failure of resolution with socket modification and the fact that he currently has a recurrent ulcer over the residual limb would recommend with proceeding with revision of the transtibial amputation.  His  best option would be to proceed with a revision of the amputation for excision of the ulcerative tissue resection approximately a centimeter of tibia and fibula and rotation of the posterior muscle flap over the end of the bone.  Discussed that he would be off his leg for at least 4 weeks.  Patient states he understands he will call us when he is ready   Follow-Up Instructions: Return if symptoms worsen or fail to improve.   Ortho Exam  Patient is alert, oriented, no adenopathy, well-dressed, normal affect, normal respiratory effort. Examination patient has recurrent ulcerative callus tissue  over the end of the residual limb there is distal migration of the soft tissue envelope.  The ulcer is 4 cm in diameter with ulcerative tissue that does not extend to bone or tendon.  There is no cellulitis no drainage.  No new hemoglobin A1c values last 1 was 2018.  Imaging: No results found. No images are attached to the encounter.  Labs: Lab Results  Component Value Date   HGBA1C 8.6 (H) 04/16/2016   ESRSEDRATE 74 (H) 04/15/2016   ESRSEDRATE 127 (H) 08/10/2014   CRP 13.3 (H) 04/15/2016   CRP 8.9 (H) 08/10/2014   REPTSTATUS 04/24/2016 FINAL 04/16/2016   GRAMSTAIN  04/16/2016    RARE WBC PRESENT, PREDOMINANTLY PMN RARE GRAM NEGATIVE RODS    CULT  04/16/2016    FEW METHICILLIN RESISTANT STAPHYLOCOCCUS AUREUS FEW MORAXELLA SPECIES BETA LACTAMASE POSITIVE NO ANAEROBES ISOLATED    LABORGA METHICILLIN RESISTANT STAPHYLOCOCCUS AUREUS 04/16/2016     Lab Results  Component Value Date   ALBUMIN 3.2 (L) 04/17/2016   ALBUMIN 3.1 (L) 04/16/2016   ALBUMIN 3.0 (L) 04/15/2016    Lab Results  Component Value Date   MG 2.1 04/16/2016   No results found for: VD25OH  No results found for: PREALBUMIN CBC EXTENDED Latest Ref Rng & Units 12/15/2016 04/17/2016 04/16/2016  WBC 4.0 - 10.5 K/uL 8.2 8.6 7.4  RBC 4.22 - 5.81 MIL/uL 3.91(L) 3.79(L) 3.73(L)  HGB 13.0 - 17.0 g/dL 11.1(L) 10.3(L) 10.1(L)  HCT 39.0 - 52.0 % 36.9(L) 34.7(L) 34.2(L)  PLT 150 - 400 K/uL 265 257 239  NEUTROABS 1.7 - 7.7 K/uL - - -  LYMPHSABS 0.7 - 4.0 K/uL - - -     Body mass index is 39.68 kg/m.  Orders:  No orders of the defined types were placed in this encounter.  No orders of the defined types were placed in this encounter.    Procedures: No procedures performed  Clinical Data: No additional findings.  ROS:  All other systems negative, except as noted in the HPI. Review of Systems  Objective: Vital Signs: Ht 5\' 8"  (1.727 m)   Wt 261 lb (118.4 kg)   BMI 39.68 kg/m   Specialty Comments:    No specialty comments available.  PMFS History: Patient Active Problem List   Diagnosis Date Noted  . Charcot's joint, left ankle and foot 10/29/2016  . History of right below knee amputation ([place]) 10/29/2016  . Diabetic polyneuropathy associated with type 2 diabetes mellitus ([place]) 10/29/2016  . ESRD (end stage renal disease) on dialysis ([place]) 04/15/2016  . Chronic osteomyelitis of toe of left foot ([place]) 04/15/2016  . Left renal mass 07/11/2015  . Biliary calculi 09/14/2014  . Hemodialysis-associated hypotension 09/13/2014  . H/O amputation of leg through tibia and fibula ([place]) 09/08/2014  . Anemia associated with chronic renal failure 09/06/2014  . Type 2 diabetes mellitus with diabetic neuropathy ([place]) 08/07/2014  . Essential hypertension 08/07/2014  . Obesity 08/07/2014  . Hypothyroidism 08/07/2014  . Kidney lump 04/06/2014  . Obstructive apnea 04/06/2014  . Diabetes mellitus ([place]) 03/23/2014  . Patient awaiting renal transplant 03/23/2014  . Asthma, moderate persistent 10/28/2013  . Anemia due to blood loss 09/14/2013  . Morbid obesity ([place]) 09/11/2013  . Morbid (severe) obesity due to excess calories ([place]) 09/11/2013  . Chronic kidney disease, stage IV (severe) ([place]) 09/10/2013  . Type 2 diabetes mellitus ([place]) 09/10/2013  . Mild persistent asthma with acute exacerbation 09/10/2013  . Abnormal ECG 05/07/2013  . CAD in native artery 05/07/2013   Past Medical History:  Diagnosis Date  . Anemia   . Arthritis   . Cancer [facility])    Renal Tumor  . CKD (chronic kidney disease)   . Coronary artery disease   . Diabetes ([place])   . DM (diabetes mellitus) with complications ([place])   . ESRD (end stage renal disease) ([place])    Pre- dialysis  . Hypertension   . Left kidney mass   . Neuropathy   . Obesity   . Osteomyelitis, chronic, ankle or foot ([place])   . PONV (postoperative nausea and vomiting)   . Renal insufficiency    Patient is on Dialysis and receives M,W and F.  . S/P  BKA (below knee amputation) [facility]) June 2016   Right , [place], [place]  . Sleep apnea    CPAP  . Thyroid disease     Family History  Problem Relation Age of Onset  . Diabetes Mother   . Kidney disease Mother   . Breast cancer Mother     Past Surgical History:  Procedure Laterality Date  . BASCILIC VEIN TRANSPOSITION Left 02/17/2015   Procedure: BASCILIC VEIN TRANSPOSITION;  Surgeon: Katha Cabal, MD;  Location: [facility] ORS;  Service: Vascular;  Laterality: Left;  . BELOW KNEE LEG AMPUTATION Right   . CORONARY ANGIOPLASTY WITH STENT PLACEMENT    . EYE SURGERY    . FOOT  SURGERY     Multiple R foot surgery for infection and charcot foot  . I & D EXTREMITY Left 04/16/2016   Procedure: IRRIGATION AND DEBRIDEMENT EXTREMITY/GREAT TOE AMP.;  Surgeon: Edrick Kins, DPM;  Location: [facility];  Service: Podiatry;  Laterality: Left;  . JOINT REPLACEMENT    . LAPAROSCOPIC NEPHRECTOMY, HAND ASSISTED Left 07/11/2015   Procedure: HAND ASSISTED LAPAROSCOPIC NEPHRECTOMY;  Surgeon: Hollice Espy, MD;  Location: [facility];  Service: Urology;  Laterality: Left;  . PERIPHERAL VASCULAR CATHETERIZATION Left 12/06/2014   Procedure: A/V Shuntogram/Fistulagram;  Surgeon: Katha Cabal, MD;  Location: [facility];  Service: Cardiovascular;  Laterality: Left;  . PERIPHERAL VASCULAR CATHETERIZATION Left 12/06/2014   Procedure: A/V Shunt Intervention;  Surgeon: Katha Cabal, MD;  Location: [facility];  Service: Cardiovascular;  Laterality: Left;  . TONSILLECTOMY     Social History   Occupational History  . Not on file  Tobacco Use  . Smoking status: Never Smoker  . Smokeless tobacco: Never Used  Substance and Sexual Activity  . Alcohol use: No  . Drug use: No  . Sexual activity: Not on file

## 2019-06-11 DIAGNOSIS — D689 Coagulation defect, unspecified: Secondary | ICD-10-CM | POA: Diagnosis not present

## 2019-06-11 DIAGNOSIS — N2581 Secondary hyperparathyroidism of renal origin: Secondary | ICD-10-CM | POA: Diagnosis not present

## 2019-06-11 DIAGNOSIS — D631 Anemia in chronic kidney disease: Secondary | ICD-10-CM | POA: Diagnosis not present

## 2019-06-11 DIAGNOSIS — Z992 Dependence on renal dialysis: Secondary | ICD-10-CM | POA: Diagnosis not present

## 2019-06-11 DIAGNOSIS — N186 End stage renal disease: Secondary | ICD-10-CM | POA: Diagnosis not present

## 2019-06-11 DIAGNOSIS — D509 Iron deficiency anemia, unspecified: Secondary | ICD-10-CM | POA: Diagnosis not present

## 2019-06-14 DIAGNOSIS — N186 End stage renal disease: Secondary | ICD-10-CM | POA: Diagnosis not present

## 2019-06-14 DIAGNOSIS — D631 Anemia in chronic kidney disease: Secondary | ICD-10-CM | POA: Diagnosis not present

## 2019-06-14 DIAGNOSIS — Z992 Dependence on renal dialysis: Secondary | ICD-10-CM | POA: Diagnosis not present

## 2019-06-14 DIAGNOSIS — D689 Coagulation defect, unspecified: Secondary | ICD-10-CM | POA: Diagnosis not present

## 2019-06-14 DIAGNOSIS — D509 Iron deficiency anemia, unspecified: Secondary | ICD-10-CM | POA: Diagnosis not present

## 2019-06-14 DIAGNOSIS — N2581 Secondary hyperparathyroidism of renal origin: Secondary | ICD-10-CM | POA: Diagnosis not present

## 2019-06-15 DIAGNOSIS — N186 End stage renal disease: Secondary | ICD-10-CM | POA: Diagnosis not present

## 2019-06-15 DIAGNOSIS — F5101 Primary insomnia: Secondary | ICD-10-CM | POA: Diagnosis not present

## 2019-06-15 DIAGNOSIS — E1122 Type 2 diabetes mellitus with diabetic chronic kidney disease: Secondary | ICD-10-CM | POA: Diagnosis not present

## 2019-06-15 DIAGNOSIS — S31831A Laceration without foreign body of anus, initial encounter: Secondary | ICD-10-CM | POA: Diagnosis not present

## 2019-06-15 DIAGNOSIS — I10 Essential (primary) hypertension: Secondary | ICD-10-CM | POA: Diagnosis not present

## 2019-06-18 DIAGNOSIS — D631 Anemia in chronic kidney disease: Secondary | ICD-10-CM | POA: Diagnosis not present

## 2019-06-18 DIAGNOSIS — D509 Iron deficiency anemia, unspecified: Secondary | ICD-10-CM | POA: Diagnosis not present

## 2019-06-18 DIAGNOSIS — N186 End stage renal disease: Secondary | ICD-10-CM | POA: Diagnosis not present

## 2019-06-18 DIAGNOSIS — Z992 Dependence on renal dialysis: Secondary | ICD-10-CM | POA: Diagnosis not present

## 2019-06-18 DIAGNOSIS — N2581 Secondary hyperparathyroidism of renal origin: Secondary | ICD-10-CM | POA: Diagnosis not present

## 2019-06-18 DIAGNOSIS — D689 Coagulation defect, unspecified: Secondary | ICD-10-CM | POA: Diagnosis not present

## 2019-06-21 ENCOUNTER — Telehealth: Payer: Self-pay | Admitting: Orthopedic Surgery

## 2019-06-21 DIAGNOSIS — Z992 Dependence on renal dialysis: Secondary | ICD-10-CM | POA: Diagnosis not present

## 2019-06-21 DIAGNOSIS — D689 Coagulation defect, unspecified: Secondary | ICD-10-CM | POA: Diagnosis not present

## 2019-06-21 DIAGNOSIS — D631 Anemia in chronic kidney disease: Secondary | ICD-10-CM | POA: Diagnosis not present

## 2019-06-21 DIAGNOSIS — N186 End stage renal disease: Secondary | ICD-10-CM | POA: Diagnosis not present

## 2019-06-21 DIAGNOSIS — D509 Iron deficiency anemia, unspecified: Secondary | ICD-10-CM | POA: Diagnosis not present

## 2019-06-21 DIAGNOSIS — N2581 Secondary hyperparathyroidism of renal origin: Secondary | ICD-10-CM | POA: Diagnosis not present

## 2019-06-21 NOTE — Telephone Encounter (Signed)
Mr. Ludlam is ready to proceed with scheduling revision of his transtibial amputation.  He would like to have this done some time in April.  Can you please fill out an order sheet and I will call Mr. Yearsley back to schedule.  Thank you!

## 2019-06-23 DIAGNOSIS — Z992 Dependence on renal dialysis: Secondary | ICD-10-CM | POA: Diagnosis not present

## 2019-06-23 DIAGNOSIS — D689 Coagulation defect, unspecified: Secondary | ICD-10-CM | POA: Diagnosis not present

## 2019-06-23 DIAGNOSIS — N186 End stage renal disease: Secondary | ICD-10-CM | POA: Diagnosis not present

## 2019-06-23 DIAGNOSIS — D509 Iron deficiency anemia, unspecified: Secondary | ICD-10-CM | POA: Diagnosis not present

## 2019-06-23 DIAGNOSIS — D631 Anemia in chronic kidney disease: Secondary | ICD-10-CM | POA: Diagnosis not present

## 2019-06-23 DIAGNOSIS — N2581 Secondary hyperparathyroidism of renal origin: Secondary | ICD-10-CM | POA: Diagnosis not present

## 2019-06-25 DIAGNOSIS — Z992 Dependence on renal dialysis: Secondary | ICD-10-CM | POA: Diagnosis not present

## 2019-06-25 DIAGNOSIS — D631 Anemia in chronic kidney disease: Secondary | ICD-10-CM | POA: Diagnosis not present

## 2019-06-25 DIAGNOSIS — D689 Coagulation defect, unspecified: Secondary | ICD-10-CM | POA: Diagnosis not present

## 2019-06-25 DIAGNOSIS — N186 End stage renal disease: Secondary | ICD-10-CM | POA: Diagnosis not present

## 2019-06-25 DIAGNOSIS — N2581 Secondary hyperparathyroidism of renal origin: Secondary | ICD-10-CM | POA: Diagnosis not present

## 2019-06-25 DIAGNOSIS — D509 Iron deficiency anemia, unspecified: Secondary | ICD-10-CM | POA: Diagnosis not present

## 2019-06-28 DIAGNOSIS — D631 Anemia in chronic kidney disease: Secondary | ICD-10-CM | POA: Diagnosis not present

## 2019-06-28 DIAGNOSIS — D509 Iron deficiency anemia, unspecified: Secondary | ICD-10-CM | POA: Diagnosis not present

## 2019-06-28 DIAGNOSIS — Z992 Dependence on renal dialysis: Secondary | ICD-10-CM | POA: Diagnosis not present

## 2019-06-28 DIAGNOSIS — D689 Coagulation defect, unspecified: Secondary | ICD-10-CM | POA: Diagnosis not present

## 2019-06-28 DIAGNOSIS — N186 End stage renal disease: Secondary | ICD-10-CM | POA: Diagnosis not present

## 2019-06-28 DIAGNOSIS — N2581 Secondary hyperparathyroidism of renal origin: Secondary | ICD-10-CM | POA: Diagnosis not present

## 2019-06-30 DIAGNOSIS — D689 Coagulation defect, unspecified: Secondary | ICD-10-CM | POA: Diagnosis not present

## 2019-06-30 DIAGNOSIS — Z992 Dependence on renal dialysis: Secondary | ICD-10-CM | POA: Diagnosis not present

## 2019-06-30 DIAGNOSIS — D509 Iron deficiency anemia, unspecified: Secondary | ICD-10-CM | POA: Diagnosis not present

## 2019-06-30 DIAGNOSIS — N2581 Secondary hyperparathyroidism of renal origin: Secondary | ICD-10-CM | POA: Diagnosis not present

## 2019-06-30 DIAGNOSIS — E1122 Type 2 diabetes mellitus with diabetic chronic kidney disease: Secondary | ICD-10-CM | POA: Diagnosis not present

## 2019-06-30 DIAGNOSIS — N186 End stage renal disease: Secondary | ICD-10-CM | POA: Diagnosis not present

## 2019-06-30 DIAGNOSIS — D631 Anemia in chronic kidney disease: Secondary | ICD-10-CM | POA: Diagnosis not present

## 2019-07-02 DIAGNOSIS — D689 Coagulation defect, unspecified: Secondary | ICD-10-CM | POA: Diagnosis not present

## 2019-07-02 DIAGNOSIS — R52 Pain, unspecified: Secondary | ICD-10-CM | POA: Diagnosis not present

## 2019-07-02 DIAGNOSIS — D631 Anemia in chronic kidney disease: Secondary | ICD-10-CM | POA: Diagnosis not present

## 2019-07-02 DIAGNOSIS — D509 Iron deficiency anemia, unspecified: Secondary | ICD-10-CM | POA: Diagnosis not present

## 2019-07-02 DIAGNOSIS — Z992 Dependence on renal dialysis: Secondary | ICD-10-CM | POA: Diagnosis not present

## 2019-07-02 DIAGNOSIS — E039 Hypothyroidism, unspecified: Secondary | ICD-10-CM | POA: Diagnosis not present

## 2019-07-02 DIAGNOSIS — E1122 Type 2 diabetes mellitus with diabetic chronic kidney disease: Secondary | ICD-10-CM | POA: Diagnosis not present

## 2019-07-02 DIAGNOSIS — R197 Diarrhea, unspecified: Secondary | ICD-10-CM | POA: Diagnosis not present

## 2019-07-02 DIAGNOSIS — N186 End stage renal disease: Secondary | ICD-10-CM | POA: Diagnosis not present

## 2019-07-02 DIAGNOSIS — N2581 Secondary hyperparathyroidism of renal origin: Secondary | ICD-10-CM | POA: Diagnosis not present

## 2019-07-05 DIAGNOSIS — E1122 Type 2 diabetes mellitus with diabetic chronic kidney disease: Secondary | ICD-10-CM | POA: Diagnosis not present

## 2019-07-05 DIAGNOSIS — N186 End stage renal disease: Secondary | ICD-10-CM | POA: Diagnosis not present

## 2019-07-05 DIAGNOSIS — D689 Coagulation defect, unspecified: Secondary | ICD-10-CM | POA: Diagnosis not present

## 2019-07-05 DIAGNOSIS — R197 Diarrhea, unspecified: Secondary | ICD-10-CM | POA: Diagnosis not present

## 2019-07-05 DIAGNOSIS — E039 Hypothyroidism, unspecified: Secondary | ICD-10-CM | POA: Diagnosis not present

## 2019-07-05 DIAGNOSIS — R52 Pain, unspecified: Secondary | ICD-10-CM | POA: Diagnosis not present

## 2019-07-05 DIAGNOSIS — D509 Iron deficiency anemia, unspecified: Secondary | ICD-10-CM | POA: Diagnosis not present

## 2019-07-05 DIAGNOSIS — Z992 Dependence on renal dialysis: Secondary | ICD-10-CM | POA: Diagnosis not present

## 2019-07-05 DIAGNOSIS — N2581 Secondary hyperparathyroidism of renal origin: Secondary | ICD-10-CM | POA: Diagnosis not present

## 2019-07-05 DIAGNOSIS — D631 Anemia in chronic kidney disease: Secondary | ICD-10-CM | POA: Diagnosis not present

## 2019-07-07 DIAGNOSIS — D509 Iron deficiency anemia, unspecified: Secondary | ICD-10-CM | POA: Diagnosis not present

## 2019-07-07 DIAGNOSIS — N186 End stage renal disease: Secondary | ICD-10-CM | POA: Diagnosis not present

## 2019-07-07 DIAGNOSIS — D689 Coagulation defect, unspecified: Secondary | ICD-10-CM | POA: Diagnosis not present

## 2019-07-07 DIAGNOSIS — Z992 Dependence on renal dialysis: Secondary | ICD-10-CM | POA: Diagnosis not present

## 2019-07-07 DIAGNOSIS — E039 Hypothyroidism, unspecified: Secondary | ICD-10-CM | POA: Diagnosis not present

## 2019-07-07 DIAGNOSIS — R197 Diarrhea, unspecified: Secondary | ICD-10-CM | POA: Diagnosis not present

## 2019-07-07 DIAGNOSIS — E1122 Type 2 diabetes mellitus with diabetic chronic kidney disease: Secondary | ICD-10-CM | POA: Diagnosis not present

## 2019-07-07 DIAGNOSIS — N2581 Secondary hyperparathyroidism of renal origin: Secondary | ICD-10-CM | POA: Diagnosis not present

## 2019-07-07 DIAGNOSIS — D631 Anemia in chronic kidney disease: Secondary | ICD-10-CM | POA: Diagnosis not present

## 2019-07-07 DIAGNOSIS — R52 Pain, unspecified: Secondary | ICD-10-CM | POA: Diagnosis not present

## 2019-07-09 DIAGNOSIS — Z992 Dependence on renal dialysis: Secondary | ICD-10-CM | POA: Diagnosis not present

## 2019-07-09 DIAGNOSIS — R197 Diarrhea, unspecified: Secondary | ICD-10-CM | POA: Diagnosis not present

## 2019-07-09 DIAGNOSIS — N186 End stage renal disease: Secondary | ICD-10-CM | POA: Diagnosis not present

## 2019-07-09 DIAGNOSIS — R52 Pain, unspecified: Secondary | ICD-10-CM | POA: Diagnosis not present

## 2019-07-09 DIAGNOSIS — D509 Iron deficiency anemia, unspecified: Secondary | ICD-10-CM | POA: Diagnosis not present

## 2019-07-09 DIAGNOSIS — D631 Anemia in chronic kidney disease: Secondary | ICD-10-CM | POA: Diagnosis not present

## 2019-07-09 DIAGNOSIS — D689 Coagulation defect, unspecified: Secondary | ICD-10-CM | POA: Diagnosis not present

## 2019-07-09 DIAGNOSIS — N2581 Secondary hyperparathyroidism of renal origin: Secondary | ICD-10-CM | POA: Diagnosis not present

## 2019-07-09 DIAGNOSIS — E1122 Type 2 diabetes mellitus with diabetic chronic kidney disease: Secondary | ICD-10-CM | POA: Diagnosis not present

## 2019-07-09 DIAGNOSIS — E039 Hypothyroidism, unspecified: Secondary | ICD-10-CM | POA: Diagnosis not present

## 2019-07-12 DIAGNOSIS — D509 Iron deficiency anemia, unspecified: Secondary | ICD-10-CM | POA: Diagnosis not present

## 2019-07-12 DIAGNOSIS — R197 Diarrhea, unspecified: Secondary | ICD-10-CM | POA: Diagnosis not present

## 2019-07-12 DIAGNOSIS — D631 Anemia in chronic kidney disease: Secondary | ICD-10-CM | POA: Diagnosis not present

## 2019-07-12 DIAGNOSIS — D689 Coagulation defect, unspecified: Secondary | ICD-10-CM | POA: Diagnosis not present

## 2019-07-12 DIAGNOSIS — R52 Pain, unspecified: Secondary | ICD-10-CM | POA: Diagnosis not present

## 2019-07-12 DIAGNOSIS — N2581 Secondary hyperparathyroidism of renal origin: Secondary | ICD-10-CM | POA: Diagnosis not present

## 2019-07-12 DIAGNOSIS — E039 Hypothyroidism, unspecified: Secondary | ICD-10-CM | POA: Diagnosis not present

## 2019-07-12 DIAGNOSIS — N186 End stage renal disease: Secondary | ICD-10-CM | POA: Diagnosis not present

## 2019-07-12 DIAGNOSIS — E1122 Type 2 diabetes mellitus with diabetic chronic kidney disease: Secondary | ICD-10-CM | POA: Diagnosis not present

## 2019-07-12 DIAGNOSIS — Z992 Dependence on renal dialysis: Secondary | ICD-10-CM | POA: Diagnosis not present

## 2019-07-13 ENCOUNTER — Encounter: Payer: Medicare Other | Admitting: Vascular Surgery

## 2019-07-13 ENCOUNTER — Other Ambulatory Visit (HOSPITAL_COMMUNITY): Payer: Medicare Other

## 2019-07-14 DIAGNOSIS — E1122 Type 2 diabetes mellitus with diabetic chronic kidney disease: Secondary | ICD-10-CM | POA: Diagnosis not present

## 2019-07-14 DIAGNOSIS — D631 Anemia in chronic kidney disease: Secondary | ICD-10-CM | POA: Diagnosis not present

## 2019-07-14 DIAGNOSIS — E039 Hypothyroidism, unspecified: Secondary | ICD-10-CM | POA: Diagnosis not present

## 2019-07-14 DIAGNOSIS — D689 Coagulation defect, unspecified: Secondary | ICD-10-CM | POA: Diagnosis not present

## 2019-07-14 DIAGNOSIS — R197 Diarrhea, unspecified: Secondary | ICD-10-CM | POA: Diagnosis not present

## 2019-07-14 DIAGNOSIS — D509 Iron deficiency anemia, unspecified: Secondary | ICD-10-CM | POA: Diagnosis not present

## 2019-07-14 DIAGNOSIS — N2581 Secondary hyperparathyroidism of renal origin: Secondary | ICD-10-CM | POA: Diagnosis not present

## 2019-07-14 DIAGNOSIS — Z992 Dependence on renal dialysis: Secondary | ICD-10-CM | POA: Diagnosis not present

## 2019-07-14 DIAGNOSIS — N186 End stage renal disease: Secondary | ICD-10-CM | POA: Diagnosis not present

## 2019-07-14 DIAGNOSIS — R52 Pain, unspecified: Secondary | ICD-10-CM | POA: Diagnosis not present

## 2019-07-16 DIAGNOSIS — D689 Coagulation defect, unspecified: Secondary | ICD-10-CM | POA: Diagnosis not present

## 2019-07-16 DIAGNOSIS — D631 Anemia in chronic kidney disease: Secondary | ICD-10-CM | POA: Diagnosis not present

## 2019-07-16 DIAGNOSIS — D509 Iron deficiency anemia, unspecified: Secondary | ICD-10-CM | POA: Diagnosis not present

## 2019-07-16 DIAGNOSIS — E039 Hypothyroidism, unspecified: Secondary | ICD-10-CM | POA: Diagnosis not present

## 2019-07-16 DIAGNOSIS — E1122 Type 2 diabetes mellitus with diabetic chronic kidney disease: Secondary | ICD-10-CM | POA: Diagnosis not present

## 2019-07-16 DIAGNOSIS — Z992 Dependence on renal dialysis: Secondary | ICD-10-CM | POA: Diagnosis not present

## 2019-07-16 DIAGNOSIS — N186 End stage renal disease: Secondary | ICD-10-CM | POA: Diagnosis not present

## 2019-07-16 DIAGNOSIS — R52 Pain, unspecified: Secondary | ICD-10-CM | POA: Diagnosis not present

## 2019-07-16 DIAGNOSIS — R197 Diarrhea, unspecified: Secondary | ICD-10-CM | POA: Diagnosis not present

## 2019-07-16 DIAGNOSIS — N2581 Secondary hyperparathyroidism of renal origin: Secondary | ICD-10-CM | POA: Diagnosis not present

## 2019-07-19 DIAGNOSIS — Z992 Dependence on renal dialysis: Secondary | ICD-10-CM | POA: Diagnosis not present

## 2019-07-19 DIAGNOSIS — D631 Anemia in chronic kidney disease: Secondary | ICD-10-CM | POA: Diagnosis not present

## 2019-07-19 DIAGNOSIS — R52 Pain, unspecified: Secondary | ICD-10-CM | POA: Diagnosis not present

## 2019-07-19 DIAGNOSIS — E039 Hypothyroidism, unspecified: Secondary | ICD-10-CM | POA: Diagnosis not present

## 2019-07-19 DIAGNOSIS — N186 End stage renal disease: Secondary | ICD-10-CM | POA: Diagnosis not present

## 2019-07-19 DIAGNOSIS — D689 Coagulation defect, unspecified: Secondary | ICD-10-CM | POA: Diagnosis not present

## 2019-07-19 DIAGNOSIS — E1122 Type 2 diabetes mellitus with diabetic chronic kidney disease: Secondary | ICD-10-CM | POA: Diagnosis not present

## 2019-07-19 DIAGNOSIS — N2581 Secondary hyperparathyroidism of renal origin: Secondary | ICD-10-CM | POA: Diagnosis not present

## 2019-07-19 DIAGNOSIS — R197 Diarrhea, unspecified: Secondary | ICD-10-CM | POA: Diagnosis not present

## 2019-07-19 DIAGNOSIS — D509 Iron deficiency anemia, unspecified: Secondary | ICD-10-CM | POA: Diagnosis not present

## 2019-07-21 DIAGNOSIS — Z992 Dependence on renal dialysis: Secondary | ICD-10-CM | POA: Diagnosis not present

## 2019-07-21 DIAGNOSIS — R197 Diarrhea, unspecified: Secondary | ICD-10-CM | POA: Diagnosis not present

## 2019-07-21 DIAGNOSIS — E1122 Type 2 diabetes mellitus with diabetic chronic kidney disease: Secondary | ICD-10-CM | POA: Diagnosis not present

## 2019-07-21 DIAGNOSIS — D689 Coagulation defect, unspecified: Secondary | ICD-10-CM | POA: Diagnosis not present

## 2019-07-21 DIAGNOSIS — N2581 Secondary hyperparathyroidism of renal origin: Secondary | ICD-10-CM | POA: Diagnosis not present

## 2019-07-21 DIAGNOSIS — E039 Hypothyroidism, unspecified: Secondary | ICD-10-CM | POA: Diagnosis not present

## 2019-07-21 DIAGNOSIS — N186 End stage renal disease: Secondary | ICD-10-CM | POA: Diagnosis not present

## 2019-07-21 DIAGNOSIS — D631 Anemia in chronic kidney disease: Secondary | ICD-10-CM | POA: Diagnosis not present

## 2019-07-21 DIAGNOSIS — R52 Pain, unspecified: Secondary | ICD-10-CM | POA: Diagnosis not present

## 2019-07-21 DIAGNOSIS — D509 Iron deficiency anemia, unspecified: Secondary | ICD-10-CM | POA: Diagnosis not present

## 2019-07-23 ENCOUNTER — Other Ambulatory Visit: Payer: Self-pay

## 2019-07-23 DIAGNOSIS — E1122 Type 2 diabetes mellitus with diabetic chronic kidney disease: Secondary | ICD-10-CM | POA: Diagnosis not present

## 2019-07-23 DIAGNOSIS — N186 End stage renal disease: Secondary | ICD-10-CM | POA: Diagnosis not present

## 2019-07-23 DIAGNOSIS — Z992 Dependence on renal dialysis: Secondary | ICD-10-CM | POA: Diagnosis not present

## 2019-07-23 DIAGNOSIS — D509 Iron deficiency anemia, unspecified: Secondary | ICD-10-CM | POA: Diagnosis not present

## 2019-07-23 DIAGNOSIS — D689 Coagulation defect, unspecified: Secondary | ICD-10-CM | POA: Diagnosis not present

## 2019-07-23 DIAGNOSIS — R197 Diarrhea, unspecified: Secondary | ICD-10-CM | POA: Diagnosis not present

## 2019-07-23 DIAGNOSIS — E039 Hypothyroidism, unspecified: Secondary | ICD-10-CM | POA: Diagnosis not present

## 2019-07-23 DIAGNOSIS — R52 Pain, unspecified: Secondary | ICD-10-CM | POA: Diagnosis not present

## 2019-07-23 DIAGNOSIS — N2581 Secondary hyperparathyroidism of renal origin: Secondary | ICD-10-CM | POA: Diagnosis not present

## 2019-07-23 DIAGNOSIS — D631 Anemia in chronic kidney disease: Secondary | ICD-10-CM | POA: Diagnosis not present

## 2019-07-26 DIAGNOSIS — E1122 Type 2 diabetes mellitus with diabetic chronic kidney disease: Secondary | ICD-10-CM | POA: Diagnosis not present

## 2019-07-26 DIAGNOSIS — R52 Pain, unspecified: Secondary | ICD-10-CM | POA: Diagnosis not present

## 2019-07-26 DIAGNOSIS — D509 Iron deficiency anemia, unspecified: Secondary | ICD-10-CM | POA: Diagnosis not present

## 2019-07-26 DIAGNOSIS — D689 Coagulation defect, unspecified: Secondary | ICD-10-CM | POA: Diagnosis not present

## 2019-07-26 DIAGNOSIS — R197 Diarrhea, unspecified: Secondary | ICD-10-CM | POA: Diagnosis not present

## 2019-07-26 DIAGNOSIS — Z992 Dependence on renal dialysis: Secondary | ICD-10-CM | POA: Diagnosis not present

## 2019-07-26 DIAGNOSIS — D631 Anemia in chronic kidney disease: Secondary | ICD-10-CM | POA: Diagnosis not present

## 2019-07-26 DIAGNOSIS — E039 Hypothyroidism, unspecified: Secondary | ICD-10-CM | POA: Diagnosis not present

## 2019-07-26 DIAGNOSIS — N2581 Secondary hyperparathyroidism of renal origin: Secondary | ICD-10-CM | POA: Diagnosis not present

## 2019-07-26 DIAGNOSIS — N186 End stage renal disease: Secondary | ICD-10-CM | POA: Diagnosis not present

## 2019-07-27 ENCOUNTER — Other Ambulatory Visit: Payer: Self-pay

## 2019-07-27 ENCOUNTER — Ambulatory Visit: Payer: Medicare Other | Admitting: Internal Medicine

## 2019-07-27 ENCOUNTER — Encounter: Payer: Self-pay | Admitting: Internal Medicine

## 2019-07-27 VITALS — BP 126/64 | HR 65 | Temp 98.8°F | Ht 67.0 in | Wt 256.6 lb

## 2019-07-27 DIAGNOSIS — E1140 Type 2 diabetes mellitus with diabetic neuropathy, unspecified: Secondary | ICD-10-CM

## 2019-07-27 DIAGNOSIS — Z89511 Acquired absence of right leg below knee: Secondary | ICD-10-CM

## 2019-07-27 DIAGNOSIS — E113593 Type 2 diabetes mellitus with proliferative diabetic retinopathy without macular edema, bilateral: Secondary | ICD-10-CM

## 2019-07-27 DIAGNOSIS — Z794 Long term (current) use of insulin: Secondary | ICD-10-CM

## 2019-07-27 LAB — GLUCOSE, POCT (MANUAL RESULT ENTRY): POC Glucose: 300 mg/dl — AB (ref 70–99)

## 2019-07-27 MED ORDER — HUMALOG KWIKPEN 200 UNIT/ML ~~LOC~~ SOPN: 20.0000 [IU] | PEN_INJECTOR | Freq: Three times a day (TID) | SUBCUTANEOUS | 6 refills | Status: DC

## 2019-07-27 NOTE — Progress Notes (Signed)
Name: Tim Becker  MRN/ DOB: [id number], 1965-08-31   Age/ Sex: 54 y.o., male    PCP: Leeroy Cha, MD   Reason for Endocrinology Evaluation: Type 2 Diabetes Mellitus     Date of Initial Endocrinology Visit: 07/27/2019     PATIENT IDENTIFIER: Tim Becker is a 54 y.o. male with a past medical history of HTN, T2 DM, ESRD (on HD since 08/2014) , right BKA, and dyslipidemia. The patient presented for initial endocrinology clinic visit on 07/27/2019 for consultative assistance with his diabetes management.    HPI: Tim Becker was    Diagnosed with DM in 1997 Prior Medications tried/Intolerance: initially was on oral glycemic agents but insulin was started due to persistent hyperglycemia. Currently checking blood sugars 2 x / day Hypoglycemia episodes : no                Hemoglobin A1c has ranged from 8.6% in 2018, peaking at 9.4% in 2021. Patient required assistance for hypoglycemia: no Patient has required hospitalization within the last 1 year from hyper or hypoglycemia: no  In terms of diet, the patient eats 2 meals a day, occasional snacking , drinks regular sodas.    HOME DIABETES REGIMEN: Lantus 80 units daily Humalog 10 units 3 times daily before every meal- mostly takes 20 units    Statin: yes ACE-I/ARB: no Prior Diabetic Education: No    METER DOWNLOAD SUMMARY: Did not bring     DIABETIC COMPLICATIONS: Microvascular complications:   Neuropathy, ESRD on HD, right BKA, and retinopathy(status post laser eye surgery)  Last eye exam: Completed 2020  Macrovascular complications:   CAD (S/P PCI 2005)   Denies: PVD, CVA   PAST HISTORY: Past Medical History:  Past Medical History:  Diagnosis Date  . Anemia   . Arthritis   . Cancer [facility])    Renal Tumor  . CKD (chronic kidney disease)   . Coronary artery disease   . Diabetes ([place])   . DM (diabetes mellitus) with complications ([place])   . ESRD (end stage renal disease) ([place])    Pre- dialysis   . Hypertension   . Left kidney mass   . Neuropathy   . Obesity   . Osteomyelitis, chronic, ankle or foot ([place])   . PONV (postoperative nausea and vomiting)   . Renal insufficiency    Patient is on Dialysis and receives M,W and F.  . S/P BKA (below knee amputation) [facility]) June 2016   Right , [place], [place]  . Sleep apnea    CPAP  . Thyroid disease    Past Surgical History:  Past Surgical History:  Procedure Laterality Date  . BASCILIC VEIN TRANSPOSITION Left 02/17/2015   Procedure: BASCILIC VEIN TRANSPOSITION;  Surgeon: Katha Cabal, MD;  Location: [facility];  Service: Vascular;  Laterality: Left;  . BELOW KNEE LEG AMPUTATION Right   . CORONARY ANGIOPLASTY WITH STENT PLACEMENT    . EYE SURGERY    . FOOT SURGERY     Multiple R foot surgery for infection and charcot foot  . I & D EXTREMITY Left 04/16/2016   Procedure: IRRIGATION AND DEBRIDEMENT EXTREMITY/GREAT TOE AMP.;  Surgeon: Edrick Kins, DPM;  Location: [facility];  Service: Podiatry;  Laterality: Left;  . JOINT REPLACEMENT    . LAPAROSCOPIC NEPHRECTOMY, HAND ASSISTED Left 07/11/2015   Procedure: HAND ASSISTED LAPAROSCOPIC NEPHRECTOMY;  Surgeon: Hollice Espy, MD;  Location: [facility];  Service: Urology;  Laterality: Left;  . PERIPHERAL VASCULAR CATHETERIZATION Left 12/06/2014  Procedure: A/V Shuntogram/Fistulagram;  Surgeon: Katha Cabal, MD;  Location: [place] [facility];  Service: Cardiovascular;  Laterality: Left;  . PERIPHERAL VASCULAR CATHETERIZATION Left 12/06/2014   Procedure: A/V Shunt Intervention;  Surgeon: Katha Cabal, MD;  Location: [place] [facility];  Service: Cardiovascular;  Laterality: Left;  . TONSILLECTOMY        Social History:  reports that he has never smoked. He has never used smokeless tobacco. He reports that he does not drink alcohol or use drugs. Family History:  Family History  Problem Relation Age of Onset  . Diabetes Mother   . Kidney disease Mother   . Breast cancer Mother       HOME MEDICATIONS: Allergies as of 07/27/2019   No Known Allergies     Medication List       Accurate as of July 27, 2019  8:38 AM. If you have any questions, ask your nurse or doctor.        Accu-Chek Softclix Lancets lancets Accu-Chek Softclix Lancets   acetaminophen 500 MG tablet Commonly known as: TYLENOL Take 1,000 mg by mouth every 6 (six) hours as needed for headache (pain).   AMBULATORY NON FORMULARY MEDICATION Nitroglycerine ointment 0.125 %  Apply a pea sized amount internally four times daily. Dispense 30 GM What changed: additional instructions   amLODipine 10 MG tablet Commonly known as: NORVASC   atorvastatin 40 MG tablet Commonly known as: LIPITOR Take 1 tablet by mouth once daily   calcium acetate 667 MG capsule Commonly known as: PHOSLO Take by mouth.   carvedilol 25 MG tablet Commonly known as: COREG Take 25 mg by mouth 2 (two) times daily with a meal.   HumaLOG KwikPen 200 UNIT/ML KwikPen Generic drug: insulin lispro Inject 10-20 Units into the skin See admin instructions. Inject 10-20 units (per sliding scale) two - three times daily with meals   insulin glargine 100 unit/mL Sopn Commonly known as: LANTUS Inject 80 Units into the skin daily before breakfast.   ONETOUCH TEST VI by In Vitro route.   PRESCRIPTION MEDICATION Inhale into the lungs at bedtime. CPAP        ALLERGIES: No Known Allergies   REVIEW OF SYSTEMS: A comprehensive ROS was conducted with the patient and is negative except as per HPI and below:  Review of Systems  Gastrointestinal: Negative for diarrhea and nausea.  Neurological: Positive for tingling.      OBJECTIVE:   VITAL SIGNS: BP 126/64 (BP Location: Right Arm, Patient Position: Sitting, Cuff Size: Large)   Pulse 65   Temp 98.8 F (37.1 C)   Ht 5\' 7"  (1.702 m)   Wt 256 lb 9.6 oz (116.4 kg)   SpO2 96%   BMI 40.19 kg/m    PHYSICAL EXAM:  General: Pt appears well and is in NAD  HEENT:   Eyes: External eye exam normal without stare, lid lag or exophthalmos.  EOM intact.   Neck: General: Supple without adenopathy or carotid bruits. Thyroid: Thyroid size normal.  No goiter or nodules appreciated. No thyroid bruit.  Lungs: Clear with good BS bilat with no rales, rhonchi, or wheezes  Heart: RRR with normal S1 and S2 and no gallops; no murmurs; no rub  Abdomen: Normoactive bowel sounds, soft, nontender, without masses or organomegaly palpable  Extremities:  Lower extremities - No pretibial edema. No lesions.  Skin: Normal texture and temperature to palpation.   Neuro: MS is good with appropriate affect, pt is alert and Ox3  DM foot exam:  Right BKA        DATA REVIEWED: 06/15/2019 A1c 9.4% TG 324 HDL 20 LDL 27    Lab Results  Component Value Date   HGBA1C 8.6 (H) 04/16/2016   Lab Results  Component Value Date   CREATININE 11.93 (H) 12/15/2016    ASSESSMENT / PLAN / RECOMMENDATIONS:   1) Type 2 Diabetes Mellitus, Poorly controlled, With neuropathic, retinopathic complications with ESRD on HD - Most recent A1c of 9.4 %. Goal A1c < 7.5 %.    - Discussed pharmacokinetics of basal/bolus insulin and the importance of taking prandial insulin with meals.  - We also discussed avoiding sugar-sweetened beverages and snacks, when possible.  -We have discussed the importance of checking glucose at home and having that data available to me. -I am not going to make major adjustments today due to lack of glucose data, but I am going to add a small dose of prandial insulin to be taken with snacks. -In office BG was 300 mg/DL, this is a fasting glucose, patient did have a snack in the form of pretzels last night.   MEDICATIONS: - Continue Lantus 80 units daily  - Humalog 20 units with each meal  - Humalog 10 units with each snack   EDUCATION / INSTRUCTIONS:  BG monitoring instructions: Patient is instructed to check his blood sugars 3 times a day, before meals.   Call [facility] if: BG persistently < 70 or > 300. . I reviewed the Rule of 15 for the treatment of hypoglycemia in detail with the patient. Literature supplied.   2) Diabetic complications:   Eye: Does  have known diabetic retinopathy.   Neuro/ Feet: Does  have known diabetic peripheral neuropathy.  Renal: Patient does  have known baseline ESRD on HD.     Follow-up in 3 months      Signed electronically by: Mack Guise, MD  [facility]  [facility] [address] [place], [place] [address] Phone: [phone] FAX: [phone]   CC: Leeroy Cha, MD [address] [place] [address] Phone: [phone]  Fax: [phone]    Return to Endocrinology clinic as below: No future appointments.

## 2019-07-27 NOTE — Patient Instructions (Addendum)
-   Continue Lantus 80 units daily  - Humalog 20 units with each meal  - Humalog 10 units with each snack       HOW TO TREAT LOW BLOOD SUGARS (Blood sugar LESS THAN 70 MG/DL)  Please follow the RULE OF 15 for the treatment of hypoglycemia treatment (when your (blood sugars are less than 70 mg/dL)    STEP 1: Take 15 grams of carbohydrates when your blood sugar is low, which includes:   3-4 GLUCOSE TABS  OR  3-4 OZ OF JUICE OR REGULAR SODA OR  ONE TUBE OF GLUCOSE GEL     STEP 2: RECHECK blood sugar in 15 MINUTES STEP 3: If your blood sugar is still low at the 15 minute recheck --> then, go back to STEP 1 and treat AGAIN with another 15 grams of carbohydrates.

## 2019-07-28 DIAGNOSIS — R52 Pain, unspecified: Secondary | ICD-10-CM | POA: Diagnosis not present

## 2019-07-28 DIAGNOSIS — R197 Diarrhea, unspecified: Secondary | ICD-10-CM | POA: Diagnosis not present

## 2019-07-28 DIAGNOSIS — N186 End stage renal disease: Secondary | ICD-10-CM | POA: Diagnosis not present

## 2019-07-28 DIAGNOSIS — D631 Anemia in chronic kidney disease: Secondary | ICD-10-CM | POA: Diagnosis not present

## 2019-07-28 DIAGNOSIS — E1122 Type 2 diabetes mellitus with diabetic chronic kidney disease: Secondary | ICD-10-CM | POA: Diagnosis not present

## 2019-07-28 DIAGNOSIS — D509 Iron deficiency anemia, unspecified: Secondary | ICD-10-CM | POA: Diagnosis not present

## 2019-07-28 DIAGNOSIS — E039 Hypothyroidism, unspecified: Secondary | ICD-10-CM | POA: Diagnosis not present

## 2019-07-28 DIAGNOSIS — Z992 Dependence on renal dialysis: Secondary | ICD-10-CM | POA: Diagnosis not present

## 2019-07-28 DIAGNOSIS — N2581 Secondary hyperparathyroidism of renal origin: Secondary | ICD-10-CM | POA: Diagnosis not present

## 2019-07-28 DIAGNOSIS — D689 Coagulation defect, unspecified: Secondary | ICD-10-CM | POA: Diagnosis not present

## 2019-07-30 DIAGNOSIS — R52 Pain, unspecified: Secondary | ICD-10-CM | POA: Diagnosis not present

## 2019-07-30 DIAGNOSIS — N186 End stage renal disease: Secondary | ICD-10-CM | POA: Diagnosis not present

## 2019-07-30 DIAGNOSIS — E1122 Type 2 diabetes mellitus with diabetic chronic kidney disease: Secondary | ICD-10-CM | POA: Diagnosis not present

## 2019-07-30 DIAGNOSIS — D689 Coagulation defect, unspecified: Secondary | ICD-10-CM | POA: Diagnosis not present

## 2019-07-30 DIAGNOSIS — N2581 Secondary hyperparathyroidism of renal origin: Secondary | ICD-10-CM | POA: Diagnosis not present

## 2019-07-30 DIAGNOSIS — D509 Iron deficiency anemia, unspecified: Secondary | ICD-10-CM | POA: Diagnosis not present

## 2019-07-30 DIAGNOSIS — R197 Diarrhea, unspecified: Secondary | ICD-10-CM | POA: Diagnosis not present

## 2019-07-30 DIAGNOSIS — Z992 Dependence on renal dialysis: Secondary | ICD-10-CM | POA: Diagnosis not present

## 2019-07-30 DIAGNOSIS — D631 Anemia in chronic kidney disease: Secondary | ICD-10-CM | POA: Diagnosis not present

## 2019-07-30 DIAGNOSIS — E039 Hypothyroidism, unspecified: Secondary | ICD-10-CM | POA: Diagnosis not present

## 2019-08-02 DIAGNOSIS — E039 Hypothyroidism, unspecified: Secondary | ICD-10-CM | POA: Diagnosis not present

## 2019-08-02 DIAGNOSIS — N2581 Secondary hyperparathyroidism of renal origin: Secondary | ICD-10-CM | POA: Diagnosis not present

## 2019-08-02 DIAGNOSIS — D631 Anemia in chronic kidney disease: Secondary | ICD-10-CM | POA: Diagnosis not present

## 2019-08-02 DIAGNOSIS — N186 End stage renal disease: Secondary | ICD-10-CM | POA: Diagnosis not present

## 2019-08-02 DIAGNOSIS — D689 Coagulation defect, unspecified: Secondary | ICD-10-CM | POA: Diagnosis not present

## 2019-08-02 DIAGNOSIS — D509 Iron deficiency anemia, unspecified: Secondary | ICD-10-CM | POA: Diagnosis not present

## 2019-08-02 DIAGNOSIS — Z992 Dependence on renal dialysis: Secondary | ICD-10-CM | POA: Diagnosis not present

## 2019-08-04 DIAGNOSIS — E039 Hypothyroidism, unspecified: Secondary | ICD-10-CM | POA: Diagnosis not present

## 2019-08-04 DIAGNOSIS — N2581 Secondary hyperparathyroidism of renal origin: Secondary | ICD-10-CM | POA: Diagnosis not present

## 2019-08-04 DIAGNOSIS — N186 End stage renal disease: Secondary | ICD-10-CM | POA: Diagnosis not present

## 2019-08-04 DIAGNOSIS — D509 Iron deficiency anemia, unspecified: Secondary | ICD-10-CM | POA: Diagnosis not present

## 2019-08-04 DIAGNOSIS — D689 Coagulation defect, unspecified: Secondary | ICD-10-CM | POA: Diagnosis not present

## 2019-08-04 DIAGNOSIS — D631 Anemia in chronic kidney disease: Secondary | ICD-10-CM | POA: Diagnosis not present

## 2019-08-04 DIAGNOSIS — Z992 Dependence on renal dialysis: Secondary | ICD-10-CM | POA: Diagnosis not present

## 2019-08-06 DIAGNOSIS — D689 Coagulation defect, unspecified: Secondary | ICD-10-CM | POA: Diagnosis not present

## 2019-08-06 DIAGNOSIS — D509 Iron deficiency anemia, unspecified: Secondary | ICD-10-CM | POA: Diagnosis not present

## 2019-08-06 DIAGNOSIS — N186 End stage renal disease: Secondary | ICD-10-CM | POA: Diagnosis not present

## 2019-08-06 DIAGNOSIS — N2581 Secondary hyperparathyroidism of renal origin: Secondary | ICD-10-CM | POA: Diagnosis not present

## 2019-08-06 DIAGNOSIS — E039 Hypothyroidism, unspecified: Secondary | ICD-10-CM | POA: Diagnosis not present

## 2019-08-06 DIAGNOSIS — Z992 Dependence on renal dialysis: Secondary | ICD-10-CM | POA: Diagnosis not present

## 2019-08-06 DIAGNOSIS — D631 Anemia in chronic kidney disease: Secondary | ICD-10-CM | POA: Diagnosis not present

## 2019-08-09 DIAGNOSIS — N2581 Secondary hyperparathyroidism of renal origin: Secondary | ICD-10-CM | POA: Diagnosis not present

## 2019-08-09 DIAGNOSIS — D631 Anemia in chronic kidney disease: Secondary | ICD-10-CM | POA: Diagnosis not present

## 2019-08-09 DIAGNOSIS — E039 Hypothyroidism, unspecified: Secondary | ICD-10-CM | POA: Diagnosis not present

## 2019-08-09 DIAGNOSIS — D689 Coagulation defect, unspecified: Secondary | ICD-10-CM | POA: Diagnosis not present

## 2019-08-09 DIAGNOSIS — D509 Iron deficiency anemia, unspecified: Secondary | ICD-10-CM | POA: Diagnosis not present

## 2019-08-09 DIAGNOSIS — N186 End stage renal disease: Secondary | ICD-10-CM | POA: Diagnosis not present

## 2019-08-09 DIAGNOSIS — Z992 Dependence on renal dialysis: Secondary | ICD-10-CM | POA: Diagnosis not present

## 2019-08-11 DIAGNOSIS — N2581 Secondary hyperparathyroidism of renal origin: Secondary | ICD-10-CM | POA: Diagnosis not present

## 2019-08-11 DIAGNOSIS — E039 Hypothyroidism, unspecified: Secondary | ICD-10-CM | POA: Diagnosis not present

## 2019-08-11 DIAGNOSIS — D631 Anemia in chronic kidney disease: Secondary | ICD-10-CM | POA: Diagnosis not present

## 2019-08-11 DIAGNOSIS — D509 Iron deficiency anemia, unspecified: Secondary | ICD-10-CM | POA: Diagnosis not present

## 2019-08-11 DIAGNOSIS — N186 End stage renal disease: Secondary | ICD-10-CM | POA: Diagnosis not present

## 2019-08-11 DIAGNOSIS — Z992 Dependence on renal dialysis: Secondary | ICD-10-CM | POA: Diagnosis not present

## 2019-08-11 DIAGNOSIS — D689 Coagulation defect, unspecified: Secondary | ICD-10-CM | POA: Diagnosis not present

## 2019-08-13 DIAGNOSIS — N2581 Secondary hyperparathyroidism of renal origin: Secondary | ICD-10-CM | POA: Diagnosis not present

## 2019-08-13 DIAGNOSIS — D689 Coagulation defect, unspecified: Secondary | ICD-10-CM | POA: Diagnosis not present

## 2019-08-13 DIAGNOSIS — D631 Anemia in chronic kidney disease: Secondary | ICD-10-CM | POA: Diagnosis not present

## 2019-08-13 DIAGNOSIS — D509 Iron deficiency anemia, unspecified: Secondary | ICD-10-CM | POA: Diagnosis not present

## 2019-08-13 DIAGNOSIS — N186 End stage renal disease: Secondary | ICD-10-CM | POA: Diagnosis not present

## 2019-08-13 DIAGNOSIS — Z992 Dependence on renal dialysis: Secondary | ICD-10-CM | POA: Diagnosis not present

## 2019-08-13 DIAGNOSIS — E039 Hypothyroidism, unspecified: Secondary | ICD-10-CM | POA: Diagnosis not present

## 2019-08-14 DIAGNOSIS — Z01818 Encounter for other preprocedural examination: Principal | ICD-10-CM

## 2019-08-14 DIAGNOSIS — R93429 Abnormal radiologic findings on diagnostic imaging of unspecified kidney: Principal | ICD-10-CM

## 2019-08-14 DIAGNOSIS — Z992 Dependence on renal dialysis: Principal | ICD-10-CM

## 2019-08-14 DIAGNOSIS — Z125 Encounter for screening for malignant neoplasm of prostate: Principal | ICD-10-CM

## 2019-08-14 DIAGNOSIS — Z01810 Encounter for preprocedural cardiovascular examination: Principal | ICD-10-CM

## 2019-08-14 DIAGNOSIS — N186 End stage renal disease: Principal | ICD-10-CM

## 2019-08-14 DIAGNOSIS — Z7289 Other problems related to lifestyle: Principal | ICD-10-CM

## 2019-08-14 DIAGNOSIS — Z794 Long term (current) use of insulin: Principal | ICD-10-CM

## 2019-08-14 DIAGNOSIS — C642 Malignant neoplasm of left kidney, except renal pelvis: Principal | ICD-10-CM

## 2019-08-14 DIAGNOSIS — E1122 Type 2 diabetes mellitus with diabetic chronic kidney disease: Principal | ICD-10-CM

## 2019-08-14 DIAGNOSIS — Z114 Encounter for screening for human immunodeficiency virus [HIV]: Principal | ICD-10-CM

## 2019-08-16 DIAGNOSIS — D631 Anemia in chronic kidney disease: Secondary | ICD-10-CM | POA: Diagnosis not present

## 2019-08-16 DIAGNOSIS — N2581 Secondary hyperparathyroidism of renal origin: Secondary | ICD-10-CM | POA: Diagnosis not present

## 2019-08-16 DIAGNOSIS — D509 Iron deficiency anemia, unspecified: Secondary | ICD-10-CM | POA: Diagnosis not present

## 2019-08-16 DIAGNOSIS — E039 Hypothyroidism, unspecified: Secondary | ICD-10-CM | POA: Diagnosis not present

## 2019-08-16 DIAGNOSIS — N186 End stage renal disease: Secondary | ICD-10-CM | POA: Diagnosis not present

## 2019-08-16 DIAGNOSIS — Z992 Dependence on renal dialysis: Secondary | ICD-10-CM | POA: Diagnosis not present

## 2019-08-16 DIAGNOSIS — D689 Coagulation defect, unspecified: Secondary | ICD-10-CM | POA: Diagnosis not present

## 2019-08-18 DIAGNOSIS — Z992 Dependence on renal dialysis: Secondary | ICD-10-CM | POA: Diagnosis not present

## 2019-08-18 DIAGNOSIS — D509 Iron deficiency anemia, unspecified: Secondary | ICD-10-CM | POA: Diagnosis not present

## 2019-08-18 DIAGNOSIS — N2581 Secondary hyperparathyroidism of renal origin: Secondary | ICD-10-CM | POA: Diagnosis not present

## 2019-08-18 DIAGNOSIS — N186 End stage renal disease: Secondary | ICD-10-CM | POA: Diagnosis not present

## 2019-08-18 DIAGNOSIS — D689 Coagulation defect, unspecified: Secondary | ICD-10-CM | POA: Diagnosis not present

## 2019-08-18 DIAGNOSIS — E039 Hypothyroidism, unspecified: Secondary | ICD-10-CM | POA: Diagnosis not present

## 2019-08-18 DIAGNOSIS — D631 Anemia in chronic kidney disease: Secondary | ICD-10-CM | POA: Diagnosis not present

## 2019-08-20 DIAGNOSIS — D631 Anemia in chronic kidney disease: Secondary | ICD-10-CM | POA: Diagnosis not present

## 2019-08-20 DIAGNOSIS — E039 Hypothyroidism, unspecified: Secondary | ICD-10-CM | POA: Diagnosis not present

## 2019-08-20 DIAGNOSIS — D689 Coagulation defect, unspecified: Secondary | ICD-10-CM | POA: Diagnosis not present

## 2019-08-20 DIAGNOSIS — N2581 Secondary hyperparathyroidism of renal origin: Secondary | ICD-10-CM | POA: Diagnosis not present

## 2019-08-20 DIAGNOSIS — N186 End stage renal disease: Secondary | ICD-10-CM | POA: Diagnosis not present

## 2019-08-20 DIAGNOSIS — Z992 Dependence on renal dialysis: Secondary | ICD-10-CM | POA: Diagnosis not present

## 2019-08-20 DIAGNOSIS — D509 Iron deficiency anemia, unspecified: Secondary | ICD-10-CM | POA: Diagnosis not present

## 2019-08-23 DIAGNOSIS — E039 Hypothyroidism, unspecified: Secondary | ICD-10-CM | POA: Diagnosis not present

## 2019-08-23 DIAGNOSIS — D689 Coagulation defect, unspecified: Secondary | ICD-10-CM | POA: Diagnosis not present

## 2019-08-23 DIAGNOSIS — N186 End stage renal disease: Secondary | ICD-10-CM | POA: Diagnosis not present

## 2019-08-23 DIAGNOSIS — Z992 Dependence on renal dialysis: Secondary | ICD-10-CM | POA: Diagnosis not present

## 2019-08-23 DIAGNOSIS — D509 Iron deficiency anemia, unspecified: Secondary | ICD-10-CM | POA: Diagnosis not present

## 2019-08-23 DIAGNOSIS — D631 Anemia in chronic kidney disease: Secondary | ICD-10-CM | POA: Diagnosis not present

## 2019-08-23 DIAGNOSIS — N2581 Secondary hyperparathyroidism of renal origin: Secondary | ICD-10-CM | POA: Diagnosis not present

## 2019-08-25 DIAGNOSIS — N186 End stage renal disease: Secondary | ICD-10-CM | POA: Diagnosis not present

## 2019-08-25 DIAGNOSIS — D509 Iron deficiency anemia, unspecified: Secondary | ICD-10-CM | POA: Diagnosis not present

## 2019-08-25 DIAGNOSIS — D631 Anemia in chronic kidney disease: Secondary | ICD-10-CM | POA: Diagnosis not present

## 2019-08-25 DIAGNOSIS — D689 Coagulation defect, unspecified: Secondary | ICD-10-CM | POA: Diagnosis not present

## 2019-08-25 DIAGNOSIS — E039 Hypothyroidism, unspecified: Secondary | ICD-10-CM | POA: Diagnosis not present

## 2019-08-25 DIAGNOSIS — Z992 Dependence on renal dialysis: Secondary | ICD-10-CM | POA: Diagnosis not present

## 2019-08-25 DIAGNOSIS — N2581 Secondary hyperparathyroidism of renal origin: Secondary | ICD-10-CM | POA: Diagnosis not present

## 2019-08-27 DIAGNOSIS — D689 Coagulation defect, unspecified: Secondary | ICD-10-CM | POA: Diagnosis not present

## 2019-08-27 DIAGNOSIS — N186 End stage renal disease: Secondary | ICD-10-CM | POA: Diagnosis not present

## 2019-08-27 DIAGNOSIS — D509 Iron deficiency anemia, unspecified: Secondary | ICD-10-CM | POA: Diagnosis not present

## 2019-08-27 DIAGNOSIS — N2581 Secondary hyperparathyroidism of renal origin: Secondary | ICD-10-CM | POA: Diagnosis not present

## 2019-08-27 DIAGNOSIS — Z992 Dependence on renal dialysis: Secondary | ICD-10-CM | POA: Diagnosis not present

## 2019-08-27 DIAGNOSIS — D631 Anemia in chronic kidney disease: Secondary | ICD-10-CM | POA: Diagnosis not present

## 2019-08-27 DIAGNOSIS — E039 Hypothyroidism, unspecified: Secondary | ICD-10-CM | POA: Diagnosis not present

## 2019-08-30 DIAGNOSIS — D689 Coagulation defect, unspecified: Secondary | ICD-10-CM | POA: Diagnosis not present

## 2019-08-30 DIAGNOSIS — N2581 Secondary hyperparathyroidism of renal origin: Secondary | ICD-10-CM | POA: Diagnosis not present

## 2019-08-30 DIAGNOSIS — N186 End stage renal disease: Secondary | ICD-10-CM | POA: Diagnosis not present

## 2019-08-30 DIAGNOSIS — D631 Anemia in chronic kidney disease: Secondary | ICD-10-CM | POA: Diagnosis not present

## 2019-08-30 DIAGNOSIS — E039 Hypothyroidism, unspecified: Secondary | ICD-10-CM | POA: Diagnosis not present

## 2019-08-30 DIAGNOSIS — Z992 Dependence on renal dialysis: Secondary | ICD-10-CM | POA: Diagnosis not present

## 2019-08-30 DIAGNOSIS — E1122 Type 2 diabetes mellitus with diabetic chronic kidney disease: Secondary | ICD-10-CM | POA: Diagnosis not present

## 2019-08-30 DIAGNOSIS — D509 Iron deficiency anemia, unspecified: Secondary | ICD-10-CM | POA: Diagnosis not present

## 2019-09-01 DIAGNOSIS — Z992 Dependence on renal dialysis: Secondary | ICD-10-CM | POA: Diagnosis not present

## 2019-09-01 DIAGNOSIS — E039 Hypothyroidism, unspecified: Secondary | ICD-10-CM | POA: Diagnosis not present

## 2019-09-01 DIAGNOSIS — D689 Coagulation defect, unspecified: Secondary | ICD-10-CM | POA: Diagnosis not present

## 2019-09-01 DIAGNOSIS — D631 Anemia in chronic kidney disease: Secondary | ICD-10-CM | POA: Diagnosis not present

## 2019-09-01 DIAGNOSIS — N186 End stage renal disease: Secondary | ICD-10-CM | POA: Diagnosis not present

## 2019-09-01 DIAGNOSIS — N2581 Secondary hyperparathyroidism of renal origin: Secondary | ICD-10-CM | POA: Diagnosis not present

## 2019-09-03 DIAGNOSIS — E039 Hypothyroidism, unspecified: Secondary | ICD-10-CM | POA: Diagnosis not present

## 2019-09-03 DIAGNOSIS — Z992 Dependence on renal dialysis: Secondary | ICD-10-CM | POA: Diagnosis not present

## 2019-09-03 DIAGNOSIS — D631 Anemia in chronic kidney disease: Secondary | ICD-10-CM | POA: Diagnosis not present

## 2019-09-03 DIAGNOSIS — N2581 Secondary hyperparathyroidism of renal origin: Secondary | ICD-10-CM | POA: Diagnosis not present

## 2019-09-03 DIAGNOSIS — N186 End stage renal disease: Secondary | ICD-10-CM | POA: Diagnosis not present

## 2019-09-03 DIAGNOSIS — D689 Coagulation defect, unspecified: Secondary | ICD-10-CM | POA: Diagnosis not present

## 2019-09-06 DIAGNOSIS — E039 Hypothyroidism, unspecified: Secondary | ICD-10-CM | POA: Diagnosis not present

## 2019-09-06 DIAGNOSIS — Z992 Dependence on renal dialysis: Secondary | ICD-10-CM | POA: Diagnosis not present

## 2019-09-06 DIAGNOSIS — D631 Anemia in chronic kidney disease: Secondary | ICD-10-CM | POA: Diagnosis not present

## 2019-09-06 DIAGNOSIS — N2581 Secondary hyperparathyroidism of renal origin: Secondary | ICD-10-CM | POA: Diagnosis not present

## 2019-09-06 DIAGNOSIS — N186 End stage renal disease: Secondary | ICD-10-CM | POA: Diagnosis not present

## 2019-09-06 DIAGNOSIS — D689 Coagulation defect, unspecified: Secondary | ICD-10-CM | POA: Diagnosis not present

## 2019-09-07 DIAGNOSIS — E039 Hypothyroidism, unspecified: Secondary | ICD-10-CM | POA: Diagnosis not present

## 2019-09-07 DIAGNOSIS — Z0000 Encounter for general adult medical examination without abnormal findings: Secondary | ICD-10-CM | POA: Diagnosis not present

## 2019-09-08 DIAGNOSIS — E039 Hypothyroidism, unspecified: Secondary | ICD-10-CM | POA: Diagnosis not present

## 2019-09-08 DIAGNOSIS — N186 End stage renal disease: Secondary | ICD-10-CM | POA: Diagnosis not present

## 2019-09-08 DIAGNOSIS — N2581 Secondary hyperparathyroidism of renal origin: Secondary | ICD-10-CM | POA: Diagnosis not present

## 2019-09-08 DIAGNOSIS — D689 Coagulation defect, unspecified: Secondary | ICD-10-CM | POA: Diagnosis not present

## 2019-09-08 DIAGNOSIS — Z992 Dependence on renal dialysis: Secondary | ICD-10-CM | POA: Diagnosis not present

## 2019-09-08 DIAGNOSIS — D631 Anemia in chronic kidney disease: Secondary | ICD-10-CM | POA: Diagnosis not present

## 2019-09-10 DIAGNOSIS — D689 Coagulation defect, unspecified: Secondary | ICD-10-CM | POA: Diagnosis not present

## 2019-09-10 DIAGNOSIS — N186 End stage renal disease: Secondary | ICD-10-CM | POA: Diagnosis not present

## 2019-09-10 DIAGNOSIS — Z992 Dependence on renal dialysis: Secondary | ICD-10-CM | POA: Diagnosis not present

## 2019-09-10 DIAGNOSIS — D631 Anemia in chronic kidney disease: Secondary | ICD-10-CM | POA: Diagnosis not present

## 2019-09-10 DIAGNOSIS — N2581 Secondary hyperparathyroidism of renal origin: Secondary | ICD-10-CM | POA: Diagnosis not present

## 2019-09-10 DIAGNOSIS — E039 Hypothyroidism, unspecified: Secondary | ICD-10-CM | POA: Diagnosis not present

## 2019-09-13 DIAGNOSIS — E039 Hypothyroidism, unspecified: Secondary | ICD-10-CM | POA: Diagnosis not present

## 2019-09-13 DIAGNOSIS — D631 Anemia in chronic kidney disease: Secondary | ICD-10-CM | POA: Diagnosis not present

## 2019-09-13 DIAGNOSIS — N2581 Secondary hyperparathyroidism of renal origin: Secondary | ICD-10-CM | POA: Diagnosis not present

## 2019-09-13 DIAGNOSIS — N186 End stage renal disease: Secondary | ICD-10-CM | POA: Diagnosis not present

## 2019-09-13 DIAGNOSIS — D689 Coagulation defect, unspecified: Secondary | ICD-10-CM | POA: Diagnosis not present

## 2019-09-13 DIAGNOSIS — Z992 Dependence on renal dialysis: Secondary | ICD-10-CM | POA: Diagnosis not present

## 2019-09-14 DIAGNOSIS — Z1389 Encounter for screening for other disorder: Secondary | ICD-10-CM | POA: Diagnosis not present

## 2019-09-14 DIAGNOSIS — Z0000 Encounter for general adult medical examination without abnormal findings: Secondary | ICD-10-CM | POA: Diagnosis not present

## 2019-09-17 DIAGNOSIS — N2581 Secondary hyperparathyroidism of renal origin: Secondary | ICD-10-CM | POA: Diagnosis not present

## 2019-09-17 DIAGNOSIS — E039 Hypothyroidism, unspecified: Secondary | ICD-10-CM | POA: Diagnosis not present

## 2019-09-17 DIAGNOSIS — D689 Coagulation defect, unspecified: Secondary | ICD-10-CM | POA: Diagnosis not present

## 2019-09-17 DIAGNOSIS — N186 End stage renal disease: Secondary | ICD-10-CM | POA: Diagnosis not present

## 2019-09-17 DIAGNOSIS — Z992 Dependence on renal dialysis: Secondary | ICD-10-CM | POA: Diagnosis not present

## 2019-09-17 DIAGNOSIS — D631 Anemia in chronic kidney disease: Secondary | ICD-10-CM | POA: Diagnosis not present

## 2019-09-20 DIAGNOSIS — Z992 Dependence on renal dialysis: Secondary | ICD-10-CM | POA: Diagnosis not present

## 2019-09-20 DIAGNOSIS — D631 Anemia in chronic kidney disease: Secondary | ICD-10-CM | POA: Diagnosis not present

## 2019-09-20 DIAGNOSIS — N2581 Secondary hyperparathyroidism of renal origin: Secondary | ICD-10-CM | POA: Diagnosis not present

## 2019-09-20 DIAGNOSIS — D689 Coagulation defect, unspecified: Secondary | ICD-10-CM | POA: Diagnosis not present

## 2019-09-20 DIAGNOSIS — E039 Hypothyroidism, unspecified: Secondary | ICD-10-CM | POA: Diagnosis not present

## 2019-09-20 DIAGNOSIS — N186 End stage renal disease: Secondary | ICD-10-CM | POA: Diagnosis not present

## 2019-09-22 DIAGNOSIS — N2581 Secondary hyperparathyroidism of renal origin: Secondary | ICD-10-CM | POA: Diagnosis not present

## 2019-09-22 DIAGNOSIS — E039 Hypothyroidism, unspecified: Secondary | ICD-10-CM | POA: Diagnosis not present

## 2019-09-22 DIAGNOSIS — N186 End stage renal disease: Secondary | ICD-10-CM | POA: Diagnosis not present

## 2019-09-22 DIAGNOSIS — Z992 Dependence on renal dialysis: Secondary | ICD-10-CM | POA: Diagnosis not present

## 2019-09-22 DIAGNOSIS — D689 Coagulation defect, unspecified: Secondary | ICD-10-CM | POA: Diagnosis not present

## 2019-09-22 DIAGNOSIS — D631 Anemia in chronic kidney disease: Secondary | ICD-10-CM | POA: Diagnosis not present

## 2019-09-24 DIAGNOSIS — Z992 Dependence on renal dialysis: Secondary | ICD-10-CM | POA: Diagnosis not present

## 2019-09-24 DIAGNOSIS — D689 Coagulation defect, unspecified: Secondary | ICD-10-CM | POA: Diagnosis not present

## 2019-09-24 DIAGNOSIS — N2581 Secondary hyperparathyroidism of renal origin: Secondary | ICD-10-CM | POA: Diagnosis not present

## 2019-09-24 DIAGNOSIS — N186 End stage renal disease: Secondary | ICD-10-CM | POA: Diagnosis not present

## 2019-09-24 DIAGNOSIS — E039 Hypothyroidism, unspecified: Secondary | ICD-10-CM | POA: Diagnosis not present

## 2019-09-24 DIAGNOSIS — D631 Anemia in chronic kidney disease: Secondary | ICD-10-CM | POA: Diagnosis not present

## 2019-09-27 DIAGNOSIS — Z992 Dependence on renal dialysis: Secondary | ICD-10-CM | POA: Diagnosis not present

## 2019-09-27 DIAGNOSIS — D631 Anemia in chronic kidney disease: Secondary | ICD-10-CM | POA: Diagnosis not present

## 2019-09-27 DIAGNOSIS — D689 Coagulation defect, unspecified: Secondary | ICD-10-CM | POA: Diagnosis not present

## 2019-09-27 DIAGNOSIS — N2581 Secondary hyperparathyroidism of renal origin: Secondary | ICD-10-CM | POA: Diagnosis not present

## 2019-09-27 DIAGNOSIS — E039 Hypothyroidism, unspecified: Secondary | ICD-10-CM | POA: Diagnosis not present

## 2019-09-27 DIAGNOSIS — N186 End stage renal disease: Secondary | ICD-10-CM | POA: Diagnosis not present

## 2019-09-29 DIAGNOSIS — E039 Hypothyroidism, unspecified: Secondary | ICD-10-CM | POA: Diagnosis not present

## 2019-09-29 DIAGNOSIS — E1122 Type 2 diabetes mellitus with diabetic chronic kidney disease: Secondary | ICD-10-CM | POA: Diagnosis not present

## 2019-09-29 DIAGNOSIS — N186 End stage renal disease: Secondary | ICD-10-CM | POA: Diagnosis not present

## 2019-09-29 DIAGNOSIS — N2581 Secondary hyperparathyroidism of renal origin: Secondary | ICD-10-CM | POA: Diagnosis not present

## 2019-09-29 DIAGNOSIS — D631 Anemia in chronic kidney disease: Secondary | ICD-10-CM | POA: Diagnosis not present

## 2019-09-29 DIAGNOSIS — Z992 Dependence on renal dialysis: Secondary | ICD-10-CM | POA: Diagnosis not present

## 2019-09-29 DIAGNOSIS — D689 Coagulation defect, unspecified: Secondary | ICD-10-CM | POA: Diagnosis not present

## 2019-10-01 DIAGNOSIS — E1122 Type 2 diabetes mellitus with diabetic chronic kidney disease: Secondary | ICD-10-CM | POA: Diagnosis not present

## 2019-10-01 DIAGNOSIS — N2581 Secondary hyperparathyroidism of renal origin: Secondary | ICD-10-CM | POA: Diagnosis not present

## 2019-10-01 DIAGNOSIS — D631 Anemia in chronic kidney disease: Secondary | ICD-10-CM | POA: Diagnosis not present

## 2019-10-01 DIAGNOSIS — N186 End stage renal disease: Secondary | ICD-10-CM | POA: Diagnosis not present

## 2019-10-01 DIAGNOSIS — Z992 Dependence on renal dialysis: Secondary | ICD-10-CM | POA: Diagnosis not present

## 2019-10-01 DIAGNOSIS — D689 Coagulation defect, unspecified: Secondary | ICD-10-CM | POA: Diagnosis not present

## 2019-10-01 DIAGNOSIS — E039 Hypothyroidism, unspecified: Secondary | ICD-10-CM | POA: Diagnosis not present

## 2019-10-04 DIAGNOSIS — E039 Hypothyroidism, unspecified: Secondary | ICD-10-CM | POA: Diagnosis not present

## 2019-10-04 DIAGNOSIS — N186 End stage renal disease: Secondary | ICD-10-CM | POA: Diagnosis not present

## 2019-10-04 DIAGNOSIS — N2581 Secondary hyperparathyroidism of renal origin: Secondary | ICD-10-CM | POA: Diagnosis not present

## 2019-10-04 DIAGNOSIS — Z992 Dependence on renal dialysis: Secondary | ICD-10-CM | POA: Diagnosis not present

## 2019-10-04 DIAGNOSIS — D631 Anemia in chronic kidney disease: Secondary | ICD-10-CM | POA: Diagnosis not present

## 2019-10-04 DIAGNOSIS — D689 Coagulation defect, unspecified: Secondary | ICD-10-CM | POA: Diagnosis not present

## 2019-10-04 DIAGNOSIS — E1122 Type 2 diabetes mellitus with diabetic chronic kidney disease: Secondary | ICD-10-CM | POA: Diagnosis not present

## 2019-10-06 DIAGNOSIS — E1122 Type 2 diabetes mellitus with diabetic chronic kidney disease: Secondary | ICD-10-CM | POA: Diagnosis not present

## 2019-10-06 DIAGNOSIS — N2581 Secondary hyperparathyroidism of renal origin: Secondary | ICD-10-CM | POA: Diagnosis not present

## 2019-10-06 DIAGNOSIS — Z992 Dependence on renal dialysis: Secondary | ICD-10-CM | POA: Diagnosis not present

## 2019-10-06 DIAGNOSIS — E039 Hypothyroidism, unspecified: Secondary | ICD-10-CM | POA: Diagnosis not present

## 2019-10-06 DIAGNOSIS — D689 Coagulation defect, unspecified: Secondary | ICD-10-CM | POA: Diagnosis not present

## 2019-10-06 DIAGNOSIS — N186 End stage renal disease: Secondary | ICD-10-CM | POA: Diagnosis not present

## 2019-10-06 DIAGNOSIS — D631 Anemia in chronic kidney disease: Secondary | ICD-10-CM | POA: Diagnosis not present

## 2019-10-08 DIAGNOSIS — Z992 Dependence on renal dialysis: Secondary | ICD-10-CM | POA: Diagnosis not present

## 2019-10-08 DIAGNOSIS — N2581 Secondary hyperparathyroidism of renal origin: Secondary | ICD-10-CM | POA: Diagnosis not present

## 2019-10-08 DIAGNOSIS — E1122 Type 2 diabetes mellitus with diabetic chronic kidney disease: Secondary | ICD-10-CM | POA: Diagnosis not present

## 2019-10-08 DIAGNOSIS — D631 Anemia in chronic kidney disease: Secondary | ICD-10-CM | POA: Diagnosis not present

## 2019-10-08 DIAGNOSIS — N186 End stage renal disease: Secondary | ICD-10-CM | POA: Diagnosis not present

## 2019-10-08 DIAGNOSIS — D689 Coagulation defect, unspecified: Secondary | ICD-10-CM | POA: Diagnosis not present

## 2019-10-08 DIAGNOSIS — E039 Hypothyroidism, unspecified: Secondary | ICD-10-CM | POA: Diagnosis not present

## 2019-10-11 DIAGNOSIS — E039 Hypothyroidism, unspecified: Secondary | ICD-10-CM | POA: Diagnosis not present

## 2019-10-11 DIAGNOSIS — D631 Anemia in chronic kidney disease: Secondary | ICD-10-CM | POA: Diagnosis not present

## 2019-10-11 DIAGNOSIS — D689 Coagulation defect, unspecified: Secondary | ICD-10-CM | POA: Diagnosis not present

## 2019-10-11 DIAGNOSIS — E1122 Type 2 diabetes mellitus with diabetic chronic kidney disease: Secondary | ICD-10-CM | POA: Diagnosis not present

## 2019-10-11 DIAGNOSIS — Z992 Dependence on renal dialysis: Secondary | ICD-10-CM | POA: Diagnosis not present

## 2019-10-11 DIAGNOSIS — N186 End stage renal disease: Secondary | ICD-10-CM | POA: Diagnosis not present

## 2019-10-11 DIAGNOSIS — N2581 Secondary hyperparathyroidism of renal origin: Secondary | ICD-10-CM | POA: Diagnosis not present

## 2019-10-13 DIAGNOSIS — D631 Anemia in chronic kidney disease: Secondary | ICD-10-CM | POA: Diagnosis not present

## 2019-10-13 DIAGNOSIS — D689 Coagulation defect, unspecified: Secondary | ICD-10-CM | POA: Diagnosis not present

## 2019-10-13 DIAGNOSIS — E039 Hypothyroidism, unspecified: Secondary | ICD-10-CM | POA: Diagnosis not present

## 2019-10-13 DIAGNOSIS — N186 End stage renal disease: Secondary | ICD-10-CM | POA: Diagnosis not present

## 2019-10-13 DIAGNOSIS — N2581 Secondary hyperparathyroidism of renal origin: Secondary | ICD-10-CM | POA: Diagnosis not present

## 2019-10-13 DIAGNOSIS — E1122 Type 2 diabetes mellitus with diabetic chronic kidney disease: Secondary | ICD-10-CM | POA: Diagnosis not present

## 2019-10-13 DIAGNOSIS — Z992 Dependence on renal dialysis: Secondary | ICD-10-CM | POA: Diagnosis not present

## 2019-10-15 DIAGNOSIS — E1122 Type 2 diabetes mellitus with diabetic chronic kidney disease: Secondary | ICD-10-CM | POA: Diagnosis not present

## 2019-10-15 DIAGNOSIS — N2581 Secondary hyperparathyroidism of renal origin: Secondary | ICD-10-CM | POA: Diagnosis not present

## 2019-10-15 DIAGNOSIS — E039 Hypothyroidism, unspecified: Secondary | ICD-10-CM | POA: Diagnosis not present

## 2019-10-15 DIAGNOSIS — D689 Coagulation defect, unspecified: Secondary | ICD-10-CM | POA: Diagnosis not present

## 2019-10-15 DIAGNOSIS — N186 End stage renal disease: Secondary | ICD-10-CM | POA: Diagnosis not present

## 2019-10-15 DIAGNOSIS — Z992 Dependence on renal dialysis: Secondary | ICD-10-CM | POA: Diagnosis not present

## 2019-10-15 DIAGNOSIS — D631 Anemia in chronic kidney disease: Secondary | ICD-10-CM | POA: Diagnosis not present

## 2019-10-18 DIAGNOSIS — Z992 Dependence on renal dialysis: Secondary | ICD-10-CM | POA: Diagnosis not present

## 2019-10-18 DIAGNOSIS — N186 End stage renal disease: Secondary | ICD-10-CM | POA: Diagnosis not present

## 2019-10-18 DIAGNOSIS — N2581 Secondary hyperparathyroidism of renal origin: Secondary | ICD-10-CM | POA: Diagnosis not present

## 2019-10-18 DIAGNOSIS — D631 Anemia in chronic kidney disease: Secondary | ICD-10-CM | POA: Diagnosis not present

## 2019-10-18 DIAGNOSIS — D689 Coagulation defect, unspecified: Secondary | ICD-10-CM | POA: Diagnosis not present

## 2019-10-18 DIAGNOSIS — E1122 Type 2 diabetes mellitus with diabetic chronic kidney disease: Secondary | ICD-10-CM | POA: Diagnosis not present

## 2019-10-18 DIAGNOSIS — E039 Hypothyroidism, unspecified: Secondary | ICD-10-CM | POA: Diagnosis not present

## 2019-10-20 DIAGNOSIS — D689 Coagulation defect, unspecified: Secondary | ICD-10-CM | POA: Diagnosis not present

## 2019-10-20 DIAGNOSIS — D631 Anemia in chronic kidney disease: Secondary | ICD-10-CM | POA: Diagnosis not present

## 2019-10-20 DIAGNOSIS — Z992 Dependence on renal dialysis: Secondary | ICD-10-CM | POA: Diagnosis not present

## 2019-10-20 DIAGNOSIS — E039 Hypothyroidism, unspecified: Secondary | ICD-10-CM | POA: Diagnosis not present

## 2019-10-20 DIAGNOSIS — N186 End stage renal disease: Secondary | ICD-10-CM | POA: Diagnosis not present

## 2019-10-20 DIAGNOSIS — N2581 Secondary hyperparathyroidism of renal origin: Secondary | ICD-10-CM | POA: Diagnosis not present

## 2019-10-20 DIAGNOSIS — E1122 Type 2 diabetes mellitus with diabetic chronic kidney disease: Secondary | ICD-10-CM | POA: Diagnosis not present

## 2019-10-22 DIAGNOSIS — D631 Anemia in chronic kidney disease: Secondary | ICD-10-CM | POA: Diagnosis not present

## 2019-10-22 DIAGNOSIS — D689 Coagulation defect, unspecified: Secondary | ICD-10-CM | POA: Diagnosis not present

## 2019-10-22 DIAGNOSIS — E039 Hypothyroidism, unspecified: Secondary | ICD-10-CM | POA: Diagnosis not present

## 2019-10-22 DIAGNOSIS — N186 End stage renal disease: Secondary | ICD-10-CM | POA: Diagnosis not present

## 2019-10-22 DIAGNOSIS — N2581 Secondary hyperparathyroidism of renal origin: Secondary | ICD-10-CM | POA: Diagnosis not present

## 2019-10-22 DIAGNOSIS — Z992 Dependence on renal dialysis: Secondary | ICD-10-CM | POA: Diagnosis not present

## 2019-10-22 DIAGNOSIS — E1122 Type 2 diabetes mellitus with diabetic chronic kidney disease: Secondary | ICD-10-CM | POA: Diagnosis not present

## 2019-10-25 DIAGNOSIS — N186 End stage renal disease: Secondary | ICD-10-CM | POA: Diagnosis not present

## 2019-10-25 DIAGNOSIS — D689 Coagulation defect, unspecified: Secondary | ICD-10-CM | POA: Diagnosis not present

## 2019-10-25 DIAGNOSIS — E039 Hypothyroidism, unspecified: Secondary | ICD-10-CM | POA: Diagnosis not present

## 2019-10-25 DIAGNOSIS — E1122 Type 2 diabetes mellitus with diabetic chronic kidney disease: Secondary | ICD-10-CM | POA: Diagnosis not present

## 2019-10-25 DIAGNOSIS — D631 Anemia in chronic kidney disease: Secondary | ICD-10-CM | POA: Diagnosis not present

## 2019-10-25 DIAGNOSIS — Z992 Dependence on renal dialysis: Secondary | ICD-10-CM | POA: Diagnosis not present

## 2019-10-25 DIAGNOSIS — N2581 Secondary hyperparathyroidism of renal origin: Secondary | ICD-10-CM | POA: Diagnosis not present

## 2019-10-26 DIAGNOSIS — Z89511 Acquired absence of right leg below knee: Secondary | ICD-10-CM | POA: Diagnosis not present

## 2019-10-27 DIAGNOSIS — N186 End stage renal disease: Principal | ICD-10-CM

## 2019-10-27 DIAGNOSIS — R93429 Abnormal radiologic findings on diagnostic imaging of unspecified kidney: Principal | ICD-10-CM

## 2019-10-27 DIAGNOSIS — Z01818 Encounter for other preprocedural examination: Principal | ICD-10-CM

## 2019-10-27 DIAGNOSIS — C642 Malignant neoplasm of left kidney, except renal pelvis: Principal | ICD-10-CM

## 2019-10-27 DIAGNOSIS — D689 Coagulation defect, unspecified: Secondary | ICD-10-CM | POA: Diagnosis not present

## 2019-10-27 DIAGNOSIS — E039 Hypothyroidism, unspecified: Secondary | ICD-10-CM | POA: Diagnosis not present

## 2019-10-27 DIAGNOSIS — Z992 Dependence on renal dialysis: Secondary | ICD-10-CM | POA: Diagnosis not present

## 2019-10-27 DIAGNOSIS — E1122 Type 2 diabetes mellitus with diabetic chronic kidney disease: Secondary | ICD-10-CM | POA: Diagnosis not present

## 2019-10-27 DIAGNOSIS — N2581 Secondary hyperparathyroidism of renal origin: Secondary | ICD-10-CM | POA: Diagnosis not present

## 2019-10-27 DIAGNOSIS — D631 Anemia in chronic kidney disease: Secondary | ICD-10-CM | POA: Diagnosis not present

## 2019-10-28 ENCOUNTER — Ambulatory Visit: Payer: Medicare Other | Admitting: Internal Medicine

## 2019-10-29 DIAGNOSIS — D689 Coagulation defect, unspecified: Secondary | ICD-10-CM | POA: Diagnosis not present

## 2019-10-29 DIAGNOSIS — D631 Anemia in chronic kidney disease: Secondary | ICD-10-CM | POA: Diagnosis not present

## 2019-10-29 DIAGNOSIS — N2581 Secondary hyperparathyroidism of renal origin: Secondary | ICD-10-CM | POA: Diagnosis not present

## 2019-10-29 DIAGNOSIS — E039 Hypothyroidism, unspecified: Secondary | ICD-10-CM | POA: Diagnosis not present

## 2019-10-29 DIAGNOSIS — E1122 Type 2 diabetes mellitus with diabetic chronic kidney disease: Secondary | ICD-10-CM | POA: Diagnosis not present

## 2019-10-29 DIAGNOSIS — Z992 Dependence on renal dialysis: Secondary | ICD-10-CM | POA: Diagnosis not present

## 2019-10-29 DIAGNOSIS — N186 End stage renal disease: Secondary | ICD-10-CM | POA: Diagnosis not present

## 2019-10-30 DIAGNOSIS — E1122 Type 2 diabetes mellitus with diabetic chronic kidney disease: Secondary | ICD-10-CM | POA: Diagnosis not present

## 2019-10-30 DIAGNOSIS — N186 End stage renal disease: Secondary | ICD-10-CM | POA: Diagnosis not present

## 2019-10-30 DIAGNOSIS — Z992 Dependence on renal dialysis: Secondary | ICD-10-CM | POA: Diagnosis not present

## 2019-11-01 DIAGNOSIS — E039 Hypothyroidism, unspecified: Secondary | ICD-10-CM | POA: Diagnosis not present

## 2019-11-01 DIAGNOSIS — D631 Anemia in chronic kidney disease: Secondary | ICD-10-CM | POA: Diagnosis not present

## 2019-11-01 DIAGNOSIS — Z992 Dependence on renal dialysis: Secondary | ICD-10-CM | POA: Diagnosis not present

## 2019-11-01 DIAGNOSIS — N2581 Secondary hyperparathyroidism of renal origin: Secondary | ICD-10-CM | POA: Diagnosis not present

## 2019-11-01 DIAGNOSIS — N186 End stage renal disease: Secondary | ICD-10-CM | POA: Diagnosis not present

## 2019-11-01 DIAGNOSIS — D689 Coagulation defect, unspecified: Secondary | ICD-10-CM | POA: Diagnosis not present

## 2019-11-02 ENCOUNTER — Telehealth: Payer: Self-pay | Admitting: *Deleted

## 2019-11-02 ENCOUNTER — Encounter: Admit: 2019-11-02 | Discharge: 2019-11-02 | Payer: MEDICARE

## 2019-11-02 ENCOUNTER — Ambulatory Visit: Admit: 2019-11-02 | Discharge: 2019-11-15 | Payer: MEDICARE

## 2019-11-02 ENCOUNTER — Ambulatory Visit: Admit: 2019-11-02 | Discharge: 2019-11-02 | Payer: MEDICARE

## 2019-11-02 ENCOUNTER — Encounter: Admit: 2019-11-02 | Discharge: 2019-11-15 | Payer: MEDICARE

## 2019-11-02 ENCOUNTER — Ambulatory Visit: Admit: 2019-11-02 | Discharge: 2019-11-04 | Payer: MEDICARE

## 2019-11-02 DIAGNOSIS — Z01810 Encounter for preprocedural cardiovascular examination: Secondary | ICD-10-CM | POA: Diagnosis not present

## 2019-11-02 NOTE — Telephone Encounter (Signed)
[email or web address] will call us back.

## 2019-11-03 DIAGNOSIS — D689 Coagulation defect, unspecified: Secondary | ICD-10-CM | POA: Diagnosis not present

## 2019-11-03 DIAGNOSIS — N186 End stage renal disease: Secondary | ICD-10-CM | POA: Diagnosis not present

## 2019-11-03 DIAGNOSIS — E039 Hypothyroidism, unspecified: Secondary | ICD-10-CM | POA: Diagnosis not present

## 2019-11-03 DIAGNOSIS — N2581 Secondary hyperparathyroidism of renal origin: Secondary | ICD-10-CM | POA: Diagnosis not present

## 2019-11-03 DIAGNOSIS — Z992 Dependence on renal dialysis: Secondary | ICD-10-CM | POA: Diagnosis not present

## 2019-11-03 DIAGNOSIS — D631 Anemia in chronic kidney disease: Secondary | ICD-10-CM | POA: Diagnosis not present

## 2019-11-05 DIAGNOSIS — D631 Anemia in chronic kidney disease: Secondary | ICD-10-CM | POA: Diagnosis not present

## 2019-11-05 DIAGNOSIS — N2581 Secondary hyperparathyroidism of renal origin: Secondary | ICD-10-CM | POA: Diagnosis not present

## 2019-11-05 DIAGNOSIS — Z992 Dependence on renal dialysis: Secondary | ICD-10-CM | POA: Diagnosis not present

## 2019-11-05 DIAGNOSIS — E039 Hypothyroidism, unspecified: Secondary | ICD-10-CM | POA: Diagnosis not present

## 2019-11-05 DIAGNOSIS — D689 Coagulation defect, unspecified: Secondary | ICD-10-CM | POA: Diagnosis not present

## 2019-11-05 DIAGNOSIS — N186 End stage renal disease: Secondary | ICD-10-CM | POA: Diagnosis not present

## 2019-11-08 DIAGNOSIS — N186 End stage renal disease: Secondary | ICD-10-CM | POA: Diagnosis not present

## 2019-11-08 DIAGNOSIS — Z992 Dependence on renal dialysis: Secondary | ICD-10-CM | POA: Diagnosis not present

## 2019-11-08 DIAGNOSIS — E039 Hypothyroidism, unspecified: Secondary | ICD-10-CM | POA: Diagnosis not present

## 2019-11-08 DIAGNOSIS — N2581 Secondary hyperparathyroidism of renal origin: Secondary | ICD-10-CM | POA: Diagnosis not present

## 2019-11-08 DIAGNOSIS — D689 Coagulation defect, unspecified: Secondary | ICD-10-CM | POA: Diagnosis not present

## 2019-11-08 DIAGNOSIS — D631 Anemia in chronic kidney disease: Secondary | ICD-10-CM | POA: Diagnosis not present

## 2019-11-10 DIAGNOSIS — N186 End stage renal disease: Secondary | ICD-10-CM | POA: Diagnosis not present

## 2019-11-10 DIAGNOSIS — N2581 Secondary hyperparathyroidism of renal origin: Secondary | ICD-10-CM | POA: Diagnosis not present

## 2019-11-10 DIAGNOSIS — D631 Anemia in chronic kidney disease: Secondary | ICD-10-CM | POA: Diagnosis not present

## 2019-11-10 DIAGNOSIS — D689 Coagulation defect, unspecified: Secondary | ICD-10-CM | POA: Diagnosis not present

## 2019-11-10 DIAGNOSIS — Z992 Dependence on renal dialysis: Secondary | ICD-10-CM | POA: Diagnosis not present

## 2019-11-10 DIAGNOSIS — E039 Hypothyroidism, unspecified: Secondary | ICD-10-CM | POA: Diagnosis not present

## 2019-11-11 ENCOUNTER — Encounter: Admit: 2019-11-11 | Payer: MEDICARE

## 2019-11-11 ENCOUNTER — Ambulatory Visit: Admit: 2019-11-11 | Payer: MEDICARE | Attending: Registered" | Primary: Registered"

## 2019-11-12 DIAGNOSIS — E039 Hypothyroidism, unspecified: Secondary | ICD-10-CM | POA: Diagnosis not present

## 2019-11-12 DIAGNOSIS — N186 End stage renal disease: Secondary | ICD-10-CM | POA: Diagnosis not present

## 2019-11-12 DIAGNOSIS — N2581 Secondary hyperparathyroidism of renal origin: Secondary | ICD-10-CM | POA: Diagnosis not present

## 2019-11-12 DIAGNOSIS — D689 Coagulation defect, unspecified: Secondary | ICD-10-CM | POA: Diagnosis not present

## 2019-11-12 DIAGNOSIS — Z992 Dependence on renal dialysis: Secondary | ICD-10-CM | POA: Diagnosis not present

## 2019-11-12 DIAGNOSIS — D631 Anemia in chronic kidney disease: Secondary | ICD-10-CM | POA: Diagnosis not present

## 2019-11-15 DIAGNOSIS — E039 Hypothyroidism, unspecified: Secondary | ICD-10-CM | POA: Diagnosis not present

## 2019-11-15 DIAGNOSIS — N2581 Secondary hyperparathyroidism of renal origin: Secondary | ICD-10-CM | POA: Diagnosis not present

## 2019-11-15 DIAGNOSIS — D631 Anemia in chronic kidney disease: Secondary | ICD-10-CM | POA: Diagnosis not present

## 2019-11-15 DIAGNOSIS — N186 End stage renal disease: Secondary | ICD-10-CM | POA: Diagnosis not present

## 2019-11-15 DIAGNOSIS — Z992 Dependence on renal dialysis: Secondary | ICD-10-CM | POA: Diagnosis not present

## 2019-11-15 DIAGNOSIS — D689 Coagulation defect, unspecified: Secondary | ICD-10-CM | POA: Diagnosis not present

## 2019-11-17 DIAGNOSIS — Z992 Dependence on renal dialysis: Secondary | ICD-10-CM | POA: Diagnosis not present

## 2019-11-17 DIAGNOSIS — D689 Coagulation defect, unspecified: Secondary | ICD-10-CM | POA: Diagnosis not present

## 2019-11-17 DIAGNOSIS — D631 Anemia in chronic kidney disease: Secondary | ICD-10-CM | POA: Diagnosis not present

## 2019-11-17 DIAGNOSIS — N186 End stage renal disease: Secondary | ICD-10-CM | POA: Diagnosis not present

## 2019-11-17 DIAGNOSIS — E039 Hypothyroidism, unspecified: Secondary | ICD-10-CM | POA: Diagnosis not present

## 2019-11-17 DIAGNOSIS — N2581 Secondary hyperparathyroidism of renal origin: Secondary | ICD-10-CM | POA: Diagnosis not present

## 2019-11-18 ENCOUNTER — Encounter: Admit: 2019-11-18 | Discharge: 2019-11-19 | Payer: MEDICARE | Attending: Surgery | Primary: Surgery

## 2019-11-19 ENCOUNTER — Telehealth: Admit: 2019-11-19 | Discharge: 2019-11-20 | Payer: MEDICARE | Attending: Registered" | Primary: Registered"

## 2019-11-19 DIAGNOSIS — D689 Coagulation defect, unspecified: Secondary | ICD-10-CM | POA: Diagnosis not present

## 2019-11-19 DIAGNOSIS — N186 End stage renal disease: Secondary | ICD-10-CM | POA: Diagnosis not present

## 2019-11-19 DIAGNOSIS — D631 Anemia in chronic kidney disease: Secondary | ICD-10-CM | POA: Diagnosis not present

## 2019-11-19 DIAGNOSIS — E039 Hypothyroidism, unspecified: Secondary | ICD-10-CM | POA: Diagnosis not present

## 2019-11-19 DIAGNOSIS — Z992 Dependence on renal dialysis: Secondary | ICD-10-CM | POA: Diagnosis not present

## 2019-11-19 DIAGNOSIS — N2581 Secondary hyperparathyroidism of renal origin: Secondary | ICD-10-CM | POA: Diagnosis not present

## 2019-11-22 DIAGNOSIS — N186 End stage renal disease: Secondary | ICD-10-CM | POA: Diagnosis not present

## 2019-11-22 DIAGNOSIS — N2581 Secondary hyperparathyroidism of renal origin: Secondary | ICD-10-CM | POA: Diagnosis not present

## 2019-11-22 DIAGNOSIS — E039 Hypothyroidism, unspecified: Secondary | ICD-10-CM | POA: Diagnosis not present

## 2019-11-22 DIAGNOSIS — Z992 Dependence on renal dialysis: Secondary | ICD-10-CM | POA: Diagnosis not present

## 2019-11-22 DIAGNOSIS — D631 Anemia in chronic kidney disease: Secondary | ICD-10-CM | POA: Diagnosis not present

## 2019-11-22 DIAGNOSIS — D689 Coagulation defect, unspecified: Secondary | ICD-10-CM | POA: Diagnosis not present

## 2019-11-24 DIAGNOSIS — N2581 Secondary hyperparathyroidism of renal origin: Secondary | ICD-10-CM | POA: Diagnosis not present

## 2019-11-24 DIAGNOSIS — Z992 Dependence on renal dialysis: Secondary | ICD-10-CM | POA: Diagnosis not present

## 2019-11-24 DIAGNOSIS — D689 Coagulation defect, unspecified: Secondary | ICD-10-CM | POA: Diagnosis not present

## 2019-11-24 DIAGNOSIS — D631 Anemia in chronic kidney disease: Secondary | ICD-10-CM | POA: Diagnosis not present

## 2019-11-24 DIAGNOSIS — N186 End stage renal disease: Secondary | ICD-10-CM | POA: Diagnosis not present

## 2019-11-24 DIAGNOSIS — E039 Hypothyroidism, unspecified: Secondary | ICD-10-CM | POA: Diagnosis not present

## 2019-11-26 DIAGNOSIS — E039 Hypothyroidism, unspecified: Secondary | ICD-10-CM | POA: Diagnosis not present

## 2019-11-26 DIAGNOSIS — D689 Coagulation defect, unspecified: Secondary | ICD-10-CM | POA: Diagnosis not present

## 2019-11-26 DIAGNOSIS — N2581 Secondary hyperparathyroidism of renal origin: Secondary | ICD-10-CM | POA: Diagnosis not present

## 2019-11-26 DIAGNOSIS — D631 Anemia in chronic kidney disease: Secondary | ICD-10-CM | POA: Diagnosis not present

## 2019-11-26 DIAGNOSIS — N186 End stage renal disease: Secondary | ICD-10-CM | POA: Diagnosis not present

## 2019-11-26 DIAGNOSIS — Z992 Dependence on renal dialysis: Secondary | ICD-10-CM | POA: Diagnosis not present

## 2019-11-29 DIAGNOSIS — Z992 Dependence on renal dialysis: Secondary | ICD-10-CM | POA: Diagnosis not present

## 2019-11-29 DIAGNOSIS — D689 Coagulation defect, unspecified: Secondary | ICD-10-CM | POA: Diagnosis not present

## 2019-11-29 DIAGNOSIS — E039 Hypothyroidism, unspecified: Secondary | ICD-10-CM | POA: Diagnosis not present

## 2019-11-29 DIAGNOSIS — N2581 Secondary hyperparathyroidism of renal origin: Secondary | ICD-10-CM | POA: Diagnosis not present

## 2019-11-29 DIAGNOSIS — D631 Anemia in chronic kidney disease: Secondary | ICD-10-CM | POA: Diagnosis not present

## 2019-11-29 DIAGNOSIS — N186 End stage renal disease: Secondary | ICD-10-CM | POA: Diagnosis not present

## 2019-11-30 DIAGNOSIS — E1122 Type 2 diabetes mellitus with diabetic chronic kidney disease: Secondary | ICD-10-CM | POA: Diagnosis not present

## 2019-11-30 DIAGNOSIS — Z992 Dependence on renal dialysis: Secondary | ICD-10-CM | POA: Diagnosis not present

## 2019-11-30 DIAGNOSIS — N186 End stage renal disease: Secondary | ICD-10-CM | POA: Diagnosis not present

## 2019-12-01 DIAGNOSIS — Z01818 Encounter for other preprocedural examination: Principal | ICD-10-CM

## 2019-12-01 DIAGNOSIS — N186 End stage renal disease: Principal | ICD-10-CM

## 2019-12-01 DIAGNOSIS — D631 Anemia in chronic kidney disease: Secondary | ICD-10-CM | POA: Diagnosis not present

## 2019-12-01 DIAGNOSIS — Z992 Dependence on renal dialysis: Secondary | ICD-10-CM | POA: Diagnosis not present

## 2019-12-01 DIAGNOSIS — D689 Coagulation defect, unspecified: Secondary | ICD-10-CM | POA: Diagnosis not present

## 2019-12-01 DIAGNOSIS — N2581 Secondary hyperparathyroidism of renal origin: Secondary | ICD-10-CM | POA: Diagnosis not present

## 2019-12-01 DIAGNOSIS — E039 Hypothyroidism, unspecified: Secondary | ICD-10-CM | POA: Diagnosis not present

## 2019-12-03 DIAGNOSIS — E039 Hypothyroidism, unspecified: Secondary | ICD-10-CM | POA: Diagnosis not present

## 2019-12-03 DIAGNOSIS — N186 End stage renal disease: Secondary | ICD-10-CM | POA: Diagnosis not present

## 2019-12-03 DIAGNOSIS — Z992 Dependence on renal dialysis: Secondary | ICD-10-CM | POA: Diagnosis not present

## 2019-12-03 DIAGNOSIS — D689 Coagulation defect, unspecified: Secondary | ICD-10-CM | POA: Diagnosis not present

## 2019-12-03 DIAGNOSIS — D631 Anemia in chronic kidney disease: Secondary | ICD-10-CM | POA: Diagnosis not present

## 2019-12-03 DIAGNOSIS — N2581 Secondary hyperparathyroidism of renal origin: Secondary | ICD-10-CM | POA: Diagnosis not present

## 2019-12-06 DIAGNOSIS — N186 End stage renal disease: Secondary | ICD-10-CM | POA: Diagnosis not present

## 2019-12-06 DIAGNOSIS — N2581 Secondary hyperparathyroidism of renal origin: Secondary | ICD-10-CM | POA: Diagnosis not present

## 2019-12-06 DIAGNOSIS — Z992 Dependence on renal dialysis: Secondary | ICD-10-CM | POA: Diagnosis not present

## 2019-12-06 DIAGNOSIS — D689 Coagulation defect, unspecified: Secondary | ICD-10-CM | POA: Diagnosis not present

## 2019-12-06 DIAGNOSIS — E039 Hypothyroidism, unspecified: Secondary | ICD-10-CM | POA: Diagnosis not present

## 2019-12-06 DIAGNOSIS — D631 Anemia in chronic kidney disease: Secondary | ICD-10-CM | POA: Diagnosis not present

## 2019-12-08 DIAGNOSIS — D689 Coagulation defect, unspecified: Secondary | ICD-10-CM | POA: Diagnosis not present

## 2019-12-08 DIAGNOSIS — Z992 Dependence on renal dialysis: Secondary | ICD-10-CM | POA: Diagnosis not present

## 2019-12-08 DIAGNOSIS — D631 Anemia in chronic kidney disease: Secondary | ICD-10-CM | POA: Diagnosis not present

## 2019-12-08 DIAGNOSIS — N186 End stage renal disease: Secondary | ICD-10-CM | POA: Diagnosis not present

## 2019-12-08 DIAGNOSIS — N2581 Secondary hyperparathyroidism of renal origin: Secondary | ICD-10-CM | POA: Diagnosis not present

## 2019-12-08 DIAGNOSIS — E039 Hypothyroidism, unspecified: Secondary | ICD-10-CM | POA: Diagnosis not present

## 2019-12-10 DIAGNOSIS — N2581 Secondary hyperparathyroidism of renal origin: Secondary | ICD-10-CM | POA: Diagnosis not present

## 2019-12-10 DIAGNOSIS — E039 Hypothyroidism, unspecified: Secondary | ICD-10-CM | POA: Diagnosis not present

## 2019-12-10 DIAGNOSIS — N186 End stage renal disease: Secondary | ICD-10-CM | POA: Diagnosis not present

## 2019-12-10 DIAGNOSIS — D689 Coagulation defect, unspecified: Secondary | ICD-10-CM | POA: Diagnosis not present

## 2019-12-10 DIAGNOSIS — D631 Anemia in chronic kidney disease: Secondary | ICD-10-CM | POA: Diagnosis not present

## 2019-12-10 DIAGNOSIS — Z992 Dependence on renal dialysis: Secondary | ICD-10-CM | POA: Diagnosis not present

## 2019-12-13 DIAGNOSIS — N2581 Secondary hyperparathyroidism of renal origin: Secondary | ICD-10-CM | POA: Diagnosis not present

## 2019-12-13 DIAGNOSIS — D631 Anemia in chronic kidney disease: Secondary | ICD-10-CM | POA: Diagnosis not present

## 2019-12-13 DIAGNOSIS — N186 End stage renal disease: Secondary | ICD-10-CM | POA: Diagnosis not present

## 2019-12-13 DIAGNOSIS — D689 Coagulation defect, unspecified: Secondary | ICD-10-CM | POA: Diagnosis not present

## 2019-12-13 DIAGNOSIS — E039 Hypothyroidism, unspecified: Secondary | ICD-10-CM | POA: Diagnosis not present

## 2019-12-13 DIAGNOSIS — Z992 Dependence on renal dialysis: Secondary | ICD-10-CM | POA: Diagnosis not present

## 2019-12-15 DIAGNOSIS — N186 End stage renal disease: Secondary | ICD-10-CM | POA: Diagnosis not present

## 2019-12-15 DIAGNOSIS — N2581 Secondary hyperparathyroidism of renal origin: Secondary | ICD-10-CM | POA: Diagnosis not present

## 2019-12-15 DIAGNOSIS — D689 Coagulation defect, unspecified: Secondary | ICD-10-CM | POA: Diagnosis not present

## 2019-12-15 DIAGNOSIS — E039 Hypothyroidism, unspecified: Secondary | ICD-10-CM | POA: Diagnosis not present

## 2019-12-15 DIAGNOSIS — Z992 Dependence on renal dialysis: Secondary | ICD-10-CM | POA: Diagnosis not present

## 2019-12-15 DIAGNOSIS — D631 Anemia in chronic kidney disease: Secondary | ICD-10-CM | POA: Diagnosis not present

## 2019-12-17 DIAGNOSIS — Z992 Dependence on renal dialysis: Secondary | ICD-10-CM | POA: Diagnosis not present

## 2019-12-17 DIAGNOSIS — N2581 Secondary hyperparathyroidism of renal origin: Secondary | ICD-10-CM | POA: Diagnosis not present

## 2019-12-17 DIAGNOSIS — E039 Hypothyroidism, unspecified: Secondary | ICD-10-CM | POA: Diagnosis not present

## 2019-12-17 DIAGNOSIS — D689 Coagulation defect, unspecified: Secondary | ICD-10-CM | POA: Diagnosis not present

## 2019-12-17 DIAGNOSIS — N186 End stage renal disease: Secondary | ICD-10-CM | POA: Diagnosis not present

## 2019-12-17 DIAGNOSIS — D631 Anemia in chronic kidney disease: Secondary | ICD-10-CM | POA: Diagnosis not present

## 2019-12-20 DIAGNOSIS — N2581 Secondary hyperparathyroidism of renal origin: Secondary | ICD-10-CM | POA: Diagnosis not present

## 2019-12-20 DIAGNOSIS — Z992 Dependence on renal dialysis: Secondary | ICD-10-CM | POA: Diagnosis not present

## 2019-12-20 DIAGNOSIS — N186 End stage renal disease: Secondary | ICD-10-CM | POA: Diagnosis not present

## 2019-12-20 DIAGNOSIS — E039 Hypothyroidism, unspecified: Secondary | ICD-10-CM | POA: Diagnosis not present

## 2019-12-20 DIAGNOSIS — D689 Coagulation defect, unspecified: Secondary | ICD-10-CM | POA: Diagnosis not present

## 2019-12-20 DIAGNOSIS — D631 Anemia in chronic kidney disease: Secondary | ICD-10-CM | POA: Diagnosis not present

## 2019-12-22 DIAGNOSIS — N186 End stage renal disease: Secondary | ICD-10-CM | POA: Diagnosis not present

## 2019-12-22 DIAGNOSIS — E039 Hypothyroidism, unspecified: Secondary | ICD-10-CM | POA: Diagnosis not present

## 2019-12-22 DIAGNOSIS — D689 Coagulation defect, unspecified: Secondary | ICD-10-CM | POA: Diagnosis not present

## 2019-12-22 DIAGNOSIS — Z992 Dependence on renal dialysis: Secondary | ICD-10-CM | POA: Diagnosis not present

## 2019-12-22 DIAGNOSIS — N2581 Secondary hyperparathyroidism of renal origin: Secondary | ICD-10-CM | POA: Diagnosis not present

## 2019-12-22 DIAGNOSIS — D631 Anemia in chronic kidney disease: Secondary | ICD-10-CM | POA: Diagnosis not present

## 2019-12-24 DIAGNOSIS — N2581 Secondary hyperparathyroidism of renal origin: Secondary | ICD-10-CM | POA: Diagnosis not present

## 2019-12-24 DIAGNOSIS — D631 Anemia in chronic kidney disease: Secondary | ICD-10-CM | POA: Diagnosis not present

## 2019-12-24 DIAGNOSIS — Z992 Dependence on renal dialysis: Secondary | ICD-10-CM | POA: Diagnosis not present

## 2019-12-24 DIAGNOSIS — N186 End stage renal disease: Secondary | ICD-10-CM | POA: Diagnosis not present

## 2019-12-24 DIAGNOSIS — E039 Hypothyroidism, unspecified: Secondary | ICD-10-CM | POA: Diagnosis not present

## 2019-12-24 DIAGNOSIS — D689 Coagulation defect, unspecified: Secondary | ICD-10-CM | POA: Diagnosis not present

## 2019-12-27 DIAGNOSIS — N2581 Secondary hyperparathyroidism of renal origin: Secondary | ICD-10-CM | POA: Diagnosis not present

## 2019-12-27 DIAGNOSIS — D689 Coagulation defect, unspecified: Secondary | ICD-10-CM | POA: Diagnosis not present

## 2019-12-27 DIAGNOSIS — D631 Anemia in chronic kidney disease: Secondary | ICD-10-CM | POA: Diagnosis not present

## 2019-12-27 DIAGNOSIS — E039 Hypothyroidism, unspecified: Secondary | ICD-10-CM | POA: Diagnosis not present

## 2019-12-27 DIAGNOSIS — Z992 Dependence on renal dialysis: Secondary | ICD-10-CM | POA: Diagnosis not present

## 2019-12-27 DIAGNOSIS — N186 End stage renal disease: Secondary | ICD-10-CM | POA: Diagnosis not present

## 2019-12-28 ENCOUNTER — Ambulatory Visit (INDEPENDENT_AMBULATORY_CARE_PROVIDER_SITE_OTHER): Payer: Medicare Other | Admitting: Cardiology

## 2019-12-28 ENCOUNTER — Encounter: Payer: Self-pay | Admitting: Cardiology

## 2019-12-28 ENCOUNTER — Other Ambulatory Visit: Payer: Self-pay

## 2019-12-28 VITALS — BP 130/62 | HR 67 | Temp 97.2°F | Ht 68.0 in | Wt 261.8 lb

## 2019-12-28 DIAGNOSIS — I953 Hypotension of hemodialysis: Secondary | ICD-10-CM | POA: Diagnosis not present

## 2019-12-28 DIAGNOSIS — Z7189 Other specified counseling: Secondary | ICD-10-CM

## 2019-12-28 DIAGNOSIS — E1140 Type 2 diabetes mellitus with diabetic neuropathy, unspecified: Secondary | ICD-10-CM | POA: Diagnosis not present

## 2019-12-28 DIAGNOSIS — Z794 Long term (current) use of insulin: Secondary | ICD-10-CM

## 2019-12-28 DIAGNOSIS — I10 Essential (primary) hypertension: Secondary | ICD-10-CM

## 2019-12-28 DIAGNOSIS — Z992 Dependence on renal dialysis: Secondary | ICD-10-CM

## 2019-12-28 DIAGNOSIS — N186 End stage renal disease: Secondary | ICD-10-CM

## 2019-12-28 DIAGNOSIS — I2510 Atherosclerotic heart disease of native coronary artery without angina pectoris: Secondary | ICD-10-CM | POA: Diagnosis not present

## 2019-12-28 MED ORDER — ATORVASTATIN CALCIUM 40 MG PO TABS: 40.0000 mg | ORAL_TABLET | Freq: Every day | ORAL | 3 refills | Status: DC

## 2019-12-28 NOTE — Patient Instructions (Signed)

## 2019-12-28 NOTE — Progress Notes (Signed)
Cardiology Office Note:    Date:  12/28/2019   ID:  Tim Becker, DOB [birth year], MRN [id number]  PCP:  Leeroy Cha, MD  Cardiologist:  Buford Dresser, MD PhD  Referring MD: Leeroy Cha,*   CC: follow up  History of Present Illness:    Tim Becker is a 54 y.o. male with a hx of CAD, hypertension, asthma, obstructive sleep apnea, type II diabetes, end stage renal disease on dialysis, history of amputation, and obesity (BMI 39) who is seen for follow up today. I initially met him 11/13/17 as a new consult at the request of Dr. Fara Olden for evaluation and management of history of CAD.  Pertinent history: CAD, s/p stent in 2009 in [place] (no records) History of renal cell carcinoma s/p left kidney nephrectomy. On hemodialysis for ESRD. Being evaluated for kidney transplant by [facility] S/P right BKA 2/2 chronic osteomyelitis.  Hypertension since the age of 54 years old (after tonsil/adenoid surgery), started on medication in his 39s. Type II diabetes on insulin, diagnosed in his 78s  Today: Being followed by [facility] for renal transplant. Told he needs to lose weight and improve his diabetes prior to being listed for transplant.  Blood pressures at dialysis intermittently running low, worst is when he has dialysis and then goes home and takes his medications. Now he will check his BP at home before taking his medications. Won't take his medication if his systolic is 40J-81X; will wait several hours until his BP is improved.  Doing well with atorvastatin, refilled today.   ROS notable for leg falling asleep when he has sat on the toilet for a while. Now improved. Does have some distal LE neuropathy and phantom pain.   Denies chest pain, shortness of breath at rest or with normal exertion. No PND, orthopnea, LE edema or unexpected weight gain. No syncope or palpitations.  Past Medical History:  Diagnosis Date  . Anemia   . Arthritis   . Cancer [facility])    Renal  Tumor  . CKD (chronic kidney disease)   . Coronary artery disease   . Diabetes ([place])   . DM (diabetes mellitus) with complications ([place])   . ESRD (end stage renal disease) ([place])    Pre- dialysis  . Hypertension   . Left kidney mass   . Neuropathy   . Obesity   . Osteomyelitis, chronic, ankle or foot ([place])   . PONV (postoperative nausea and vomiting)   . Renal insufficiency    Patient is on Dialysis and receives M,W and F.  . S/P BKA (below knee amputation) [facility]) June 2016   Right , [place], [place]  . Sleep apnea    CPAP  . Thyroid disease     Past Surgical History:  Procedure Laterality Date  . BASCILIC VEIN TRANSPOSITION Left 02/17/2015   Procedure: BASCILIC VEIN TRANSPOSITION;  Surgeon: Katha Cabal, MD;  Location: [facility] ORS;  Service: Vascular;  Laterality: Left;  . BELOW KNEE LEG AMPUTATION Right   . CORONARY ANGIOPLASTY WITH STENT PLACEMENT    . EYE SURGERY    . FOOT SURGERY     Multiple R foot surgery for infection and charcot foot  . I & D EXTREMITY Left 04/16/2016   Procedure: IRRIGATION AND DEBRIDEMENT EXTREMITY/GREAT TOE AMP.;  Surgeon: Edrick Kins, DPM;  Location: [place];  Service: Podiatry;  Laterality: Left;  . JOINT REPLACEMENT    . LAPAROSCOPIC NEPHRECTOMY, HAND ASSISTED Left 07/11/2015   Procedure: HAND ASSISTED LAPAROSCOPIC NEPHRECTOMY;  Surgeon:  Hollice Espy, MD;  Location: [facility] ORS;  Service: Urology;  Laterality: Left;  . PERIPHERAL VASCULAR CATHETERIZATION Left 12/06/2014   Procedure: A/V Shuntogram/Fistulagram;  Surgeon: Katha Cabal, MD;  Location: [place] CV LAB;  Service: Cardiovascular;  Laterality: Left;  . PERIPHERAL VASCULAR CATHETERIZATION Left 12/06/2014   Procedure: A/V Shunt Intervention;  Surgeon: Katha Cabal, MD;  Location: [place] CV LAB;  Service: Cardiovascular;  Laterality: Left;  . TONSILLECTOMY      Current Medications: Current Outpatient Medications on File Prior to Visit  Medication Sig  . Accu-Chek  Softclix Lancets lancets Accu-Chek Softclix Lancets  . acetaminophen (TYLENOL) 500 MG tablet Take 1,000 mg by mouth every 6 (six) hours as needed for headache (pain).  . AMBULATORY NON FORMULARY MEDICATION Nitroglycerine ointment 0.125 %  Apply a pea sized amount internally four times daily. Dispense 30 GM (Patient taking differently: Nitroglycerine ointment 0.125 %  Apply a pea sized amount internally four times daily as needed. Dispense 30 GM)  . amLODipine (NORVASC) 10 MG tablet   . calcium acetate (PHOSLO) 667 MG capsule Take by mouth.  . carvedilol (COREG) 25 MG tablet Take 25 mg by mouth 2 (two) times daily with a meal.   . Glucose Blood (ONETOUCH TEST VI) by In Vitro route.  . insulin glargine (LANTUS) 100 unit/mL SOPN Inject 80 Units into the skin daily before breakfast.  . insulin lispro (HUMALOG KWIKPEN) 200 UNIT/ML KwikPen Inject 20 Units into the skin with breakfast, with lunch, and with evening meal.  . PRESCRIPTION MEDICATION Inhale into the lungs at bedtime. CPAP   No current facility-administered medications on file prior to visit.     Allergies:   Patient has no known allergies.   Social History   Tobacco Use  . Smoking status: Never Smoker  . Smokeless tobacco: Never Used  Substance Use Topics  . Alcohol use: No  . Drug use: No    Family History: The patient's family history includes Breast cancer in his mother; Diabetes in his mother; Kidney disease in his mother. Mother had MI, breast cancer, diabetes, kidney disease, amputation, passed away at age 49.   ROS:   Please see the history of present illness.  Additional pertinent ROS otherwise unremarkable   EKGs/Labs/Other Studies Reviewed:    The following studies were reviewed today: Per Care Everywhere:  Echo 11/02/19 [facility]) 1. The left ventricle is normal in size with mildly to moderately increasedwall thickness.  2. The left ventricular systolic function is hyperdynamic, LVEF is visuallyestimated at  >70%.  3. There is grade I diastolic dysfunction (impaired relaxation).  4. The aortic valve is trileaflet with mildly thickened leaflets with mildlyreduced excursion.  5. The right ventricle is normal in size, with normal systolic function.   Stress Echo 02/28/2014--listed but I cannot see results  EKG:  EKG is ordered today.  The ekg ordered today demonstrates normal sinus rhythm at 67 bpm  Recent Labs: No results found for requested labs within last 8760 hours.  Recent Lipid Panel No results found for: CHOL, TRIG, HDL, CHOLHDL, VLDL, LDLCALC, LDLDIRECT Per [facility] report: Lipids 10/30/17 Tchol 160, LDL 68, HDL 22, TG 352 A1c 8.6 TSH 4.430  Physical Exam:    VS:  BP 130/62   Pulse 67   Temp (!) 97.2 F (36.2 C)   Ht '5\' 8"'  (1.727 m)   Wt 261 lb 12.8 oz (118.8 kg)   SpO2 95%   BMI 39.81 kg/m     Wt Readings from  Last 3 Encounters:  12/28/19 261 lb 12.8 oz (118.8 kg)  07/27/19 256 lb 9.6 oz (116.4 kg)  06/10/19 261 lb (118.4 kg)    GEN: Well nourished, well developed in no acute distress HEENT: Normal, moist mucous membranes NECK: No JVD CARDIAC: regular rhythm, normal S1 and S2, no rubs or gallops. No murmur. VASCULAR: Radial pulses 2+ bilaterally. No carotid bruits. S/P R BKA RESPIRATORY:  Clear to auscultation without rales, wheezing or rhonchi  ABDOMEN: Soft, non-tender, non-distended MUSCULOSKELETAL:  Ambulates independently SKIN: Right BKA, no significant LLE edema NEUROLOGIC:  Alert and oriented x 3. No focal neuro deficits noted. PSYCHIATRIC:  Normal affect   ASSESSMENT:    1. Coronary artery disease involving native coronary artery of native heart without angina pectoris   2. Essential hypertension   3. Hemodialysis-associated hypotension   4. ESRD (end stage renal disease) on dialysis ([place])   5. Type 2 diabetes mellitus with diabetic neuropathy, with long-term current use of insulin (HCC)   6. Cardiac risk counseling   7. Counseling on health  promotion and disease prevention    PLAN:    History of CAD: stable, asymptomatic -while there is no recommendation for statins in primary prevention in ESRD, for secondary prevention (and hopefully future renal transplant) would continue statin at this time -typically recommend aspirin 81 mg daily long term. We have discussed previously. -ECG NSR today, no acute changes  Hypertension and dialysis-related hypotension:  -managed by his nephrology team -carvedilol, amlodipine per nephrology -he knows signs/symptoms to watch for  CV risk factor management and secondary prevention recommendations: Obesity: discussed lifestyle, below.  ESRD: on HD. Working with [facility] to become a transplant candidate Diabetes: on insulin, management per endocrinology. Has neuropathy.  Secondary prevention recommendations -recommend heart healthy/Mediterranean diet, with whole grains, fruits, vegetable, fish, lean meats, nuts, and olive oil. Limit salt. -recommend moderate walking, 3-5 times/week for 30-50 minutes each session. Aim for at least 150 minutes.week. Goal should be pace of 3 miles/hours, or walking 1.5 miles in 30 minutes -recommend avoidance of tobacco products. Avoid excess alcohol.  Plan for follow up: 1 year or sooner PRN  Medication Adjustments/Labs and Tests Ordered: Current medicines are reviewed at length with the patient today.  Concerns regarding medicines are outlined above.  Orders Placed This Encounter  Procedures  . EKG 12-Lead   Meds ordered this encounter  Medications  . atorvastatin (LIPITOR) 40 MG tablet    Sig: Take 1 tablet (40 mg total) by mouth daily.    Dispense:  90 tablet    Refill:  3    Patient Instructions  Medication Instructions:  Your Physician recommend you continue on your current medication as directed.    *If you need a refill on your cardiac medications before your next appointment, please call your pharmacy*   Lab Work: None  ordered   Testing/Procedures: None ordered    Follow-Up: At [facility], you and your health needs are our priority.  As part of our continuing mission to provide you with exceptional heart care, we have created designated Provider Care Teams.  These Care Teams include your primary Cardiologist (physician) and Advanced Practice Providers (APPs -  Physician Assistants and Nurse Practitioners) who all work together to provide you with the care you need, when you need it.  We recommend signing up for the patient portal called "MyChart".  Sign up information is provided on this After Visit Summary.  MyChart is used to connect with patients for Virtual Visits (Telemedicine).  Patients are able to view lab/test results, encounter notes, upcoming appointments, etc.  Non-urgent messages can be sent to your provider as well.   To learn more about what you can do with MyChart, go to [email or web address].    Your next appointment:   1 year(s)  The format for your next appointment:   In Person  Provider:   Buford Dresser, MD        Signed, Buford Dresser, MD PhD 12/28/2019 12:46 PM    [address]

## 2019-12-29 DIAGNOSIS — N186 End stage renal disease: Secondary | ICD-10-CM | POA: Diagnosis not present

## 2019-12-29 DIAGNOSIS — Z992 Dependence on renal dialysis: Secondary | ICD-10-CM | POA: Diagnosis not present

## 2019-12-29 DIAGNOSIS — N2581 Secondary hyperparathyroidism of renal origin: Secondary | ICD-10-CM | POA: Diagnosis not present

## 2019-12-29 DIAGNOSIS — D689 Coagulation defect, unspecified: Secondary | ICD-10-CM | POA: Diagnosis not present

## 2019-12-29 DIAGNOSIS — D631 Anemia in chronic kidney disease: Secondary | ICD-10-CM | POA: Diagnosis not present

## 2019-12-29 DIAGNOSIS — E039 Hypothyroidism, unspecified: Secondary | ICD-10-CM | POA: Diagnosis not present

## 2019-12-30 DIAGNOSIS — N186 End stage renal disease: Secondary | ICD-10-CM | POA: Diagnosis not present

## 2019-12-30 DIAGNOSIS — Z992 Dependence on renal dialysis: Secondary | ICD-10-CM | POA: Diagnosis not present

## 2019-12-30 DIAGNOSIS — E1122 Type 2 diabetes mellitus with diabetic chronic kidney disease: Secondary | ICD-10-CM | POA: Diagnosis not present

## 2019-12-31 DIAGNOSIS — Z992 Dependence on renal dialysis: Secondary | ICD-10-CM | POA: Diagnosis not present

## 2019-12-31 DIAGNOSIS — D631 Anemia in chronic kidney disease: Secondary | ICD-10-CM | POA: Diagnosis not present

## 2019-12-31 DIAGNOSIS — D689 Coagulation defect, unspecified: Secondary | ICD-10-CM | POA: Diagnosis not present

## 2019-12-31 DIAGNOSIS — Z23 Encounter for immunization: Secondary | ICD-10-CM | POA: Diagnosis not present

## 2019-12-31 DIAGNOSIS — R52 Pain, unspecified: Secondary | ICD-10-CM | POA: Diagnosis not present

## 2019-12-31 DIAGNOSIS — N2581 Secondary hyperparathyroidism of renal origin: Secondary | ICD-10-CM | POA: Diagnosis not present

## 2019-12-31 DIAGNOSIS — E1122 Type 2 diabetes mellitus with diabetic chronic kidney disease: Secondary | ICD-10-CM | POA: Diagnosis not present

## 2019-12-31 DIAGNOSIS — E039 Hypothyroidism, unspecified: Secondary | ICD-10-CM | POA: Diagnosis not present

## 2019-12-31 DIAGNOSIS — N186 End stage renal disease: Secondary | ICD-10-CM | POA: Diagnosis not present

## 2020-01-03 DIAGNOSIS — R52 Pain, unspecified: Secondary | ICD-10-CM | POA: Diagnosis not present

## 2020-01-03 DIAGNOSIS — D689 Coagulation defect, unspecified: Secondary | ICD-10-CM | POA: Diagnosis not present

## 2020-01-03 DIAGNOSIS — Z992 Dependence on renal dialysis: Secondary | ICD-10-CM | POA: Diagnosis not present

## 2020-01-03 DIAGNOSIS — Z23 Encounter for immunization: Secondary | ICD-10-CM | POA: Diagnosis not present

## 2020-01-03 DIAGNOSIS — N2581 Secondary hyperparathyroidism of renal origin: Secondary | ICD-10-CM | POA: Diagnosis not present

## 2020-01-03 DIAGNOSIS — N186 End stage renal disease: Secondary | ICD-10-CM | POA: Diagnosis not present

## 2020-01-03 DIAGNOSIS — E1165 Type 2 diabetes mellitus with hyperglycemia: Secondary | ICD-10-CM | POA: Diagnosis not present

## 2020-01-03 DIAGNOSIS — I10 Essential (primary) hypertension: Secondary | ICD-10-CM | POA: Diagnosis not present

## 2020-01-03 DIAGNOSIS — E1122 Type 2 diabetes mellitus with diabetic chronic kidney disease: Secondary | ICD-10-CM | POA: Diagnosis not present

## 2020-01-03 DIAGNOSIS — E781 Pure hyperglyceridemia: Secondary | ICD-10-CM | POA: Diagnosis not present

## 2020-01-03 DIAGNOSIS — D631 Anemia in chronic kidney disease: Secondary | ICD-10-CM | POA: Diagnosis not present

## 2020-01-03 DIAGNOSIS — E1143 Type 2 diabetes mellitus with diabetic autonomic (poly)neuropathy: Secondary | ICD-10-CM | POA: Diagnosis not present

## 2020-01-03 DIAGNOSIS — E039 Hypothyroidism, unspecified: Secondary | ICD-10-CM | POA: Diagnosis not present

## 2020-01-05 DIAGNOSIS — N186 End stage renal disease: Secondary | ICD-10-CM | POA: Diagnosis not present

## 2020-01-05 DIAGNOSIS — Z992 Dependence on renal dialysis: Secondary | ICD-10-CM | POA: Diagnosis not present

## 2020-01-05 DIAGNOSIS — Z23 Encounter for immunization: Secondary | ICD-10-CM | POA: Diagnosis not present

## 2020-01-05 DIAGNOSIS — D689 Coagulation defect, unspecified: Secondary | ICD-10-CM | POA: Diagnosis not present

## 2020-01-05 DIAGNOSIS — D631 Anemia in chronic kidney disease: Secondary | ICD-10-CM | POA: Diagnosis not present

## 2020-01-05 DIAGNOSIS — E1122 Type 2 diabetes mellitus with diabetic chronic kidney disease: Secondary | ICD-10-CM | POA: Diagnosis not present

## 2020-01-05 DIAGNOSIS — E039 Hypothyroidism, unspecified: Secondary | ICD-10-CM | POA: Diagnosis not present

## 2020-01-05 DIAGNOSIS — N2581 Secondary hyperparathyroidism of renal origin: Secondary | ICD-10-CM | POA: Diagnosis not present

## 2020-01-05 DIAGNOSIS — R52 Pain, unspecified: Secondary | ICD-10-CM | POA: Diagnosis not present

## 2020-01-07 DIAGNOSIS — N2581 Secondary hyperparathyroidism of renal origin: Secondary | ICD-10-CM | POA: Diagnosis not present

## 2020-01-07 DIAGNOSIS — D689 Coagulation defect, unspecified: Secondary | ICD-10-CM | POA: Diagnosis not present

## 2020-01-07 DIAGNOSIS — R52 Pain, unspecified: Secondary | ICD-10-CM | POA: Diagnosis not present

## 2020-01-07 DIAGNOSIS — E1122 Type 2 diabetes mellitus with diabetic chronic kidney disease: Secondary | ICD-10-CM | POA: Diagnosis not present

## 2020-01-07 DIAGNOSIS — E039 Hypothyroidism, unspecified: Secondary | ICD-10-CM | POA: Diagnosis not present

## 2020-01-07 DIAGNOSIS — Z23 Encounter for immunization: Secondary | ICD-10-CM | POA: Diagnosis not present

## 2020-01-07 DIAGNOSIS — N186 End stage renal disease: Secondary | ICD-10-CM | POA: Diagnosis not present

## 2020-01-07 DIAGNOSIS — D631 Anemia in chronic kidney disease: Secondary | ICD-10-CM | POA: Diagnosis not present

## 2020-01-07 DIAGNOSIS — Z992 Dependence on renal dialysis: Secondary | ICD-10-CM | POA: Diagnosis not present

## 2020-01-10 DIAGNOSIS — D689 Coagulation defect, unspecified: Secondary | ICD-10-CM | POA: Diagnosis not present

## 2020-01-10 DIAGNOSIS — Z23 Encounter for immunization: Secondary | ICD-10-CM | POA: Diagnosis not present

## 2020-01-10 DIAGNOSIS — E039 Hypothyroidism, unspecified: Secondary | ICD-10-CM | POA: Diagnosis not present

## 2020-01-10 DIAGNOSIS — N186 End stage renal disease: Secondary | ICD-10-CM | POA: Diagnosis not present

## 2020-01-10 DIAGNOSIS — N2581 Secondary hyperparathyroidism of renal origin: Secondary | ICD-10-CM | POA: Diagnosis not present

## 2020-01-10 DIAGNOSIS — R52 Pain, unspecified: Secondary | ICD-10-CM | POA: Diagnosis not present

## 2020-01-10 DIAGNOSIS — D631 Anemia in chronic kidney disease: Secondary | ICD-10-CM | POA: Diagnosis not present

## 2020-01-10 DIAGNOSIS — Z992 Dependence on renal dialysis: Secondary | ICD-10-CM | POA: Diagnosis not present

## 2020-01-10 DIAGNOSIS — E1122 Type 2 diabetes mellitus with diabetic chronic kidney disease: Secondary | ICD-10-CM | POA: Diagnosis not present

## 2020-01-12 DIAGNOSIS — Z23 Encounter for immunization: Secondary | ICD-10-CM | POA: Diagnosis not present

## 2020-01-12 DIAGNOSIS — R52 Pain, unspecified: Secondary | ICD-10-CM | POA: Diagnosis not present

## 2020-01-12 DIAGNOSIS — D689 Coagulation defect, unspecified: Secondary | ICD-10-CM | POA: Diagnosis not present

## 2020-01-12 DIAGNOSIS — N2581 Secondary hyperparathyroidism of renal origin: Secondary | ICD-10-CM | POA: Diagnosis not present

## 2020-01-12 DIAGNOSIS — Z992 Dependence on renal dialysis: Secondary | ICD-10-CM | POA: Diagnosis not present

## 2020-01-12 DIAGNOSIS — D631 Anemia in chronic kidney disease: Secondary | ICD-10-CM | POA: Diagnosis not present

## 2020-01-12 DIAGNOSIS — E1122 Type 2 diabetes mellitus with diabetic chronic kidney disease: Secondary | ICD-10-CM | POA: Diagnosis not present

## 2020-01-12 DIAGNOSIS — N186 End stage renal disease: Secondary | ICD-10-CM | POA: Diagnosis not present

## 2020-01-12 DIAGNOSIS — E039 Hypothyroidism, unspecified: Secondary | ICD-10-CM | POA: Diagnosis not present

## 2020-01-14 DIAGNOSIS — Z992 Dependence on renal dialysis: Secondary | ICD-10-CM | POA: Diagnosis not present

## 2020-01-14 DIAGNOSIS — Z23 Encounter for immunization: Secondary | ICD-10-CM | POA: Diagnosis not present

## 2020-01-14 DIAGNOSIS — E1122 Type 2 diabetes mellitus with diabetic chronic kidney disease: Secondary | ICD-10-CM | POA: Diagnosis not present

## 2020-01-14 DIAGNOSIS — N186 End stage renal disease: Secondary | ICD-10-CM | POA: Diagnosis not present

## 2020-01-14 DIAGNOSIS — R52 Pain, unspecified: Secondary | ICD-10-CM | POA: Diagnosis not present

## 2020-01-14 DIAGNOSIS — D689 Coagulation defect, unspecified: Secondary | ICD-10-CM | POA: Diagnosis not present

## 2020-01-14 DIAGNOSIS — D631 Anemia in chronic kidney disease: Secondary | ICD-10-CM | POA: Diagnosis not present

## 2020-01-14 DIAGNOSIS — N2581 Secondary hyperparathyroidism of renal origin: Secondary | ICD-10-CM | POA: Diagnosis not present

## 2020-01-14 DIAGNOSIS — E039 Hypothyroidism, unspecified: Secondary | ICD-10-CM | POA: Diagnosis not present

## 2020-01-17 DIAGNOSIS — E1122 Type 2 diabetes mellitus with diabetic chronic kidney disease: Secondary | ICD-10-CM | POA: Diagnosis not present

## 2020-01-17 DIAGNOSIS — Z23 Encounter for immunization: Secondary | ICD-10-CM | POA: Diagnosis not present

## 2020-01-17 DIAGNOSIS — R52 Pain, unspecified: Secondary | ICD-10-CM | POA: Diagnosis not present

## 2020-01-17 DIAGNOSIS — Z992 Dependence on renal dialysis: Secondary | ICD-10-CM | POA: Diagnosis not present

## 2020-01-17 DIAGNOSIS — E039 Hypothyroidism, unspecified: Secondary | ICD-10-CM | POA: Diagnosis not present

## 2020-01-17 DIAGNOSIS — N2581 Secondary hyperparathyroidism of renal origin: Secondary | ICD-10-CM | POA: Diagnosis not present

## 2020-01-17 DIAGNOSIS — N186 End stage renal disease: Secondary | ICD-10-CM | POA: Diagnosis not present

## 2020-01-17 DIAGNOSIS — D689 Coagulation defect, unspecified: Secondary | ICD-10-CM | POA: Diagnosis not present

## 2020-01-17 DIAGNOSIS — D631 Anemia in chronic kidney disease: Secondary | ICD-10-CM | POA: Diagnosis not present

## 2020-01-19 DIAGNOSIS — E039 Hypothyroidism, unspecified: Secondary | ICD-10-CM | POA: Diagnosis not present

## 2020-01-19 DIAGNOSIS — N186 End stage renal disease: Secondary | ICD-10-CM | POA: Diagnosis not present

## 2020-01-19 DIAGNOSIS — Z23 Encounter for immunization: Secondary | ICD-10-CM | POA: Diagnosis not present

## 2020-01-19 DIAGNOSIS — Z992 Dependence on renal dialysis: Secondary | ICD-10-CM | POA: Diagnosis not present

## 2020-01-19 DIAGNOSIS — E1122 Type 2 diabetes mellitus with diabetic chronic kidney disease: Secondary | ICD-10-CM | POA: Diagnosis not present

## 2020-01-19 DIAGNOSIS — D689 Coagulation defect, unspecified: Secondary | ICD-10-CM | POA: Diagnosis not present

## 2020-01-19 DIAGNOSIS — D631 Anemia in chronic kidney disease: Secondary | ICD-10-CM | POA: Diagnosis not present

## 2020-01-19 DIAGNOSIS — N2581 Secondary hyperparathyroidism of renal origin: Secondary | ICD-10-CM | POA: Diagnosis not present

## 2020-01-19 DIAGNOSIS — R52 Pain, unspecified: Secondary | ICD-10-CM | POA: Diagnosis not present

## 2020-01-21 DIAGNOSIS — R52 Pain, unspecified: Secondary | ICD-10-CM | POA: Diagnosis not present

## 2020-01-21 DIAGNOSIS — E039 Hypothyroidism, unspecified: Secondary | ICD-10-CM | POA: Diagnosis not present

## 2020-01-21 DIAGNOSIS — N2581 Secondary hyperparathyroidism of renal origin: Secondary | ICD-10-CM | POA: Diagnosis not present

## 2020-01-21 DIAGNOSIS — D631 Anemia in chronic kidney disease: Secondary | ICD-10-CM | POA: Diagnosis not present

## 2020-01-21 DIAGNOSIS — D689 Coagulation defect, unspecified: Secondary | ICD-10-CM | POA: Diagnosis not present

## 2020-01-21 DIAGNOSIS — E1122 Type 2 diabetes mellitus with diabetic chronic kidney disease: Secondary | ICD-10-CM | POA: Diagnosis not present

## 2020-01-21 DIAGNOSIS — N186 End stage renal disease: Secondary | ICD-10-CM | POA: Diagnosis not present

## 2020-01-21 DIAGNOSIS — Z992 Dependence on renal dialysis: Secondary | ICD-10-CM | POA: Diagnosis not present

## 2020-01-21 DIAGNOSIS — Z23 Encounter for immunization: Secondary | ICD-10-CM | POA: Diagnosis not present

## 2020-01-24 DIAGNOSIS — Z23 Encounter for immunization: Secondary | ICD-10-CM | POA: Diagnosis not present

## 2020-01-24 DIAGNOSIS — N186 End stage renal disease: Secondary | ICD-10-CM | POA: Diagnosis not present

## 2020-01-24 DIAGNOSIS — R52 Pain, unspecified: Secondary | ICD-10-CM | POA: Diagnosis not present

## 2020-01-24 DIAGNOSIS — E039 Hypothyroidism, unspecified: Secondary | ICD-10-CM | POA: Diagnosis not present

## 2020-01-24 DIAGNOSIS — D689 Coagulation defect, unspecified: Secondary | ICD-10-CM | POA: Diagnosis not present

## 2020-01-24 DIAGNOSIS — D631 Anemia in chronic kidney disease: Secondary | ICD-10-CM | POA: Diagnosis not present

## 2020-01-24 DIAGNOSIS — E1122 Type 2 diabetes mellitus with diabetic chronic kidney disease: Secondary | ICD-10-CM | POA: Diagnosis not present

## 2020-01-24 DIAGNOSIS — Z992 Dependence on renal dialysis: Secondary | ICD-10-CM | POA: Diagnosis not present

## 2020-01-24 DIAGNOSIS — N2581 Secondary hyperparathyroidism of renal origin: Secondary | ICD-10-CM | POA: Diagnosis not present

## 2020-01-26 DIAGNOSIS — R52 Pain, unspecified: Secondary | ICD-10-CM | POA: Diagnosis not present

## 2020-01-26 DIAGNOSIS — E1122 Type 2 diabetes mellitus with diabetic chronic kidney disease: Secondary | ICD-10-CM | POA: Diagnosis not present

## 2020-01-26 DIAGNOSIS — N186 End stage renal disease: Secondary | ICD-10-CM | POA: Diagnosis not present

## 2020-01-26 DIAGNOSIS — Z23 Encounter for immunization: Secondary | ICD-10-CM | POA: Diagnosis not present

## 2020-01-26 DIAGNOSIS — N2581 Secondary hyperparathyroidism of renal origin: Secondary | ICD-10-CM | POA: Diagnosis not present

## 2020-01-26 DIAGNOSIS — D689 Coagulation defect, unspecified: Secondary | ICD-10-CM | POA: Diagnosis not present

## 2020-01-26 DIAGNOSIS — E039 Hypothyroidism, unspecified: Secondary | ICD-10-CM | POA: Diagnosis not present

## 2020-01-26 DIAGNOSIS — D631 Anemia in chronic kidney disease: Secondary | ICD-10-CM | POA: Diagnosis not present

## 2020-01-26 DIAGNOSIS — Z992 Dependence on renal dialysis: Secondary | ICD-10-CM | POA: Diagnosis not present

## 2020-01-28 DIAGNOSIS — Z992 Dependence on renal dialysis: Secondary | ICD-10-CM | POA: Diagnosis not present

## 2020-01-28 DIAGNOSIS — R52 Pain, unspecified: Secondary | ICD-10-CM | POA: Diagnosis not present

## 2020-01-28 DIAGNOSIS — E039 Hypothyroidism, unspecified: Secondary | ICD-10-CM | POA: Diagnosis not present

## 2020-01-28 DIAGNOSIS — N2581 Secondary hyperparathyroidism of renal origin: Secondary | ICD-10-CM | POA: Diagnosis not present

## 2020-01-28 DIAGNOSIS — D689 Coagulation defect, unspecified: Secondary | ICD-10-CM | POA: Diagnosis not present

## 2020-01-28 DIAGNOSIS — D631 Anemia in chronic kidney disease: Secondary | ICD-10-CM | POA: Diagnosis not present

## 2020-01-28 DIAGNOSIS — N186 End stage renal disease: Secondary | ICD-10-CM | POA: Diagnosis not present

## 2020-01-28 DIAGNOSIS — E1122 Type 2 diabetes mellitus with diabetic chronic kidney disease: Secondary | ICD-10-CM | POA: Diagnosis not present

## 2020-01-28 DIAGNOSIS — Z23 Encounter for immunization: Secondary | ICD-10-CM | POA: Diagnosis not present

## 2020-01-29 ENCOUNTER — Ambulatory Visit
Admission: EM | Admit: 2020-01-29 | Discharge: 2020-01-29 | Disposition: A | Discharge status: Home / Self Care | Payer: Medicare Other | Attending: Physician Assistant | Admitting: Physician Assistant

## 2020-01-29 ENCOUNTER — Other Ambulatory Visit: Payer: Self-pay

## 2020-01-29 DIAGNOSIS — M545 Low back pain, unspecified: Secondary | ICD-10-CM

## 2020-01-29 MED ORDER — TIZANIDINE HCL 2 MG PO TABS: 2.0000 mg | ORAL_TABLET | Freq: Two times a day (BID) | ORAL | 0 refills | Status: DC | PRN

## 2020-01-29 NOTE — Discharge Instructions (Signed)
Continue tylenol 1000mg  three times a day. Tizanidine as needed, can cut in half if making you drowsy. Follow up with PCP for reevaluation next week. If having urinary changes, fever, abdominal pain, go to the emergency department for further evaluation.

## 2020-01-29 NOTE — ED Triage Notes (Signed)
Pt states he is having bilateral flank pain and is currently being dialyzed on Monday, Wednesday, and Friday. Pt is on the Kidney Transplant list but has no time frame. Pt is aox4 and ambulatory.

## 2020-01-29 NOTE — ED Provider Notes (Signed)
Tim Becker    CSN: [id number] Arrival date & time: 01/29/20  0840      History   Chief Complaint Chief Complaint  Patient presents with  . Back Pain    chronic    HPI Tim Becker is a 54 y.o. male.   53 year old male with history of ESRD on HD (MWF), DM, HTN, CAD comes in for 2 day history of low back pain. Bilateral low back pain without radiation of pain. Baseline extremity numbness/tingling for neuropathy without new changes. Denies saddle anesthesia, loss of bladder or bowel control. Finished dialysis yesterday. He produces some urine at baseline. Denies dysuria, hematuria. Denies fever, abdominal symptoms.      Past Medical History:  Diagnosis Date  . Anemia   . Arthritis   . Cancer [facility])    Renal Tumor  . CKD (chronic kidney disease)   . Coronary artery disease   . Diabetes ([place])   . DM (diabetes mellitus) with complications ([place])   . ESRD (end stage renal disease) ([place])    Pre- dialysis  . Hypertension   . Left kidney mass   . Neuropathy   . Obesity   . Osteomyelitis, chronic, ankle or foot ([place])   . PONV (postoperative nausea and vomiting)   . Renal insufficiency    Patient is on Dialysis and receives M,W and F.  . S/P BKA (below knee amputation) [facility]) June 2016   Right , [place], [place]  . Sleep apnea    CPAP  . Thyroid disease     Patient Active Problem List   Diagnosis Date Noted  . Type 2 diabetes mellitus with proliferative retinopathy of both eyes, with long-term current use of insulin ([place]) 07/27/2019  . Charcot's joint, left ankle and foot 10/29/2016  . History of right below knee amputation ([place]) 10/29/2016  . Diabetic polyneuropathy associated with type 2 diabetes mellitus ([place]) 10/29/2016  . ESRD (end stage renal disease) on dialysis ([place]) 04/15/2016  . Chronic osteomyelitis of toe of left foot ([place]) 04/15/2016  . Left renal mass 07/11/2015  . Biliary calculi 09/14/2014  . Hemodialysis-associated hypotension  09/13/2014  . H/O amputation of leg through tibia and fibula ([place]) 09/08/2014  . Anemia associated with chronic renal failure 09/06/2014  . Type 2 diabetes mellitus with diabetic neuropathy ([place]) 08/07/2014  . Essential hypertension 08/07/2014  . Obesity 08/07/2014  . Hypothyroidism 08/07/2014  . Kidney lump 04/06/2014  . Obstructive apnea 04/06/2014  . Diabetes mellitus ([place]) 03/23/2014  . Patient awaiting renal transplant 03/23/2014  . Asthma, moderate persistent 10/28/2013  . Anemia due to blood loss 09/14/2013  . Morbid obesity ([place]) 09/11/2013  . Morbid (severe) obesity due to excess calories ([place]) 09/11/2013  . Chronic kidney disease, stage IV (severe) ([place]) 09/10/2013  . Type 2 diabetes mellitus ([place]) 09/10/2013  . Mild persistent asthma with acute exacerbation 09/10/2013  . Abnormal ECG 05/07/2013  . CAD in native artery 05/07/2013    Past Surgical History:  Procedure Laterality Date  . BASCILIC VEIN TRANSPOSITION Left 02/17/2015   Procedure: BASCILIC VEIN TRANSPOSITION;  Surgeon: Katha Cabal, MD;  Location: [facility] ORS;  Service: Vascular;  Laterality: Left;  . BELOW KNEE LEG AMPUTATION Right   . CORONARY ANGIOPLASTY WITH STENT PLACEMENT    . EYE SURGERY    . FOOT SURGERY     Multiple R foot surgery for infection and charcot foot  . I & D EXTREMITY Left 04/16/2016   Procedure: IRRIGATION AND DEBRIDEMENT EXTREMITY/GREAT  TOE AMP.;  Surgeon: Edrick Kins, DPM;  Location: [place];  Service: Podiatry;  Laterality: Left;  . JOINT REPLACEMENT    . LAPAROSCOPIC NEPHRECTOMY, HAND ASSISTED Left 07/11/2015   Procedure: HAND ASSISTED LAPAROSCOPIC NEPHRECTOMY;  Surgeon: Hollice Espy, MD;  Location: [facility] ORS;  Service: Urology;  Laterality: Left;  . PERIPHERAL VASCULAR CATHETERIZATION Left 12/06/2014   Procedure: A/V Shuntogram/Fistulagram;  Surgeon: Katha Cabal, MD;  Location: [place] [facility] LAB;  Service: Cardiovascular;  Laterality: Left;  . PERIPHERAL VASCULAR  CATHETERIZATION Left 12/06/2014   Procedure: A/V Shunt Intervention;  Surgeon: Katha Cabal, MD;  Location: [place] [facility] LAB;  Service: Cardiovascular;  Laterality: Left;  . TONSILLECTOMY         Home Medications    Prior to Admission medications   Medication Sig Start Date End Date Taking? Authorizing Provider  Accu-Chek Softclix Lancets lancets Accu-Chek Softclix Lancets   Yes [provider]  acetaminophen (TYLENOL) 500 MG tablet Take 1,000 mg by mouth every 6 (six) hours as needed for headache (pain).   Yes [provider]  AMBULATORY NON FORMULARY MEDICATION Nitroglycerine ointment 0.125 %  Apply a pea sized amount internally four times daily. Dispense 30 GM Patient taking differently: Nitroglycerine ointment 0.125 %  Apply a pea sized amount internally four times daily as needed. Dispense 30 GM 11/04/18  Yes Nelida Meuse III, MD  amLODipine (NORVASC) 10 MG tablet  01/12/18  Yes [provider]  atorvastatin (LIPITOR) 40 MG tablet Take 1 tablet (40 mg total) by mouth daily. 12/28/19  Yes Buford Dresser, MD  carvedilol (COREG) 25 MG tablet Take 25 mg by mouth 2 (two) times daily with a meal.    Yes [provider]  Glucose Blood (ONETOUCH TEST VI) by In Vitro route.   Yes [provider]  insulin glargine (LANTUS) 100 unit/mL SOPN Inject 80 Units into the skin daily before breakfast.   Yes [provider]  insulin lispro (HUMALOG KWIKPEN) 200 UNIT/ML KwikPen Inject 20 Units into the skin with breakfast, with lunch, and with evening meal. 07/27/19  Yes Shamleffer, Melanie Crazier, MD  PRESCRIPTION MEDICATION Inhale into the lungs at bedtime. CPAP   Yes [provider]  calcium acetate (PHOSLO) 667 MG capsule Take by mouth. 04/12/19   [provider]  tiZANidine (ZANAFLEX) 2 MG tablet Take 1 tablet (2 mg total) by mouth 2 (two) times daily as needed for muscle spasms. 01/29/20   Ok Edwards, PA-C     Family History Family History  Problem Relation Age of Onset  . Diabetes Mother   . Kidney disease Mother   . Breast cancer Mother     Social History Social History   Tobacco Use  . Smoking status: Never Smoker  . Smokeless tobacco: Never Used  Vaping Use  . Vaping Use: Never used  Substance Use Topics  . Alcohol use: No  . Drug use: No     Allergies   Patient has no known allergies.   Review of Systems Review of Systems  Reason unable to perform ROS: See HPI as above.     Physical Exam Triage Vital Signs ED Triage Vitals  Enc Vitals Group     BP 01/29/20 0936 104/65     Pulse Rate 01/29/20 0936 75     Resp 01/29/20 0936 18     Temp 01/29/20 0936 98.3 F (36.8 C)     Temp Source 01/29/20 0936 Oral     SpO2  01/29/20 0936 95 %     Weight --      Height --      Head Circumference --      Peak Flow --      Pain Score 01/29/20 0947 5     Pain Loc --      Pain Edu? --      Excl. in [place]? --    No data found.  Updated Vital Signs BP 104/65 (BP Location: Left Arm)   Pulse 75   Temp 98.3 F (36.8 C) (Oral)   Resp 18   SpO2 95%   Physical Exam Constitutional:      General: He is not in acute distress.    Appearance: He is well-developed. He is not diaphoretic.  HENT:     Head: Normocephalic and atraumatic.  Eyes:     Conjunctiva/sclera: Conjunctivae normal.     Pupils: Pupils are equal, round, and reactive to light.  Cardiovascular:     Rate and Rhythm: Normal rate and regular rhythm.     Heart sounds: Normal heart sounds. No murmur heard.  No friction rub. No gallop.   Pulmonary:     Effort: Pulmonary effort is normal. No accessory muscle usage or respiratory distress.     Breath sounds: Normal breath sounds. No stridor. No decreased breath sounds, wheezing, rhonchi or rales.  Abdominal:     Tenderness: There is no right CVA tenderness or left CVA tenderness.  Musculoskeletal:     Comments: No tenderness on palpation of the spinous  processes. Tenderness to palpation of bilateral lower back. Full ROM of back and hips. BKA bilaterally. Negative straight leg raise.  Skin:    General: Skin is warm and dry.  Neurological:     Mental Status: He is alert and oriented to person, place, and time.      UC Treatments / Results  Labs (all labs ordered are listed, but only abnormal results are displayed) Labs Reviewed - No data to display  EKG   Radiology No results found.  Procedures Procedures (including critical Becker time)  Medications Ordered in UC Medications - No data to display  Initial Impression / Assessment and Plan / UC Course  I have reviewed the triage vital signs and the nursing notes.  Pertinent labs & imaging results that were available during my Becker of the patient were reviewed by me and considered in my medical decision making (see chart for details).     No flank pain, CVA tenderness, fever. History and exam more consistent with MSK pain. Will trial low dose tizanidine for symptoms. Continue tylenol. Return precautions given.  Final Clinical Impressions(s) / UC Diagnoses   Final diagnoses:  Acute bilateral low back pain without sciatica    ED Prescriptions    Medication Sig Dispense Auth. Provider   tiZANidine (ZANAFLEX) 2 MG tablet Take 1 tablet (2 mg total) by mouth 2 (two) times daily as needed for muscle spasms. 10 tablet Ok Edwards, PA-C     PDMP not reviewed this encounter.   Ok Edwards, PA-C 01/29/20 1311

## 2020-01-30 DIAGNOSIS — Z992 Dependence on renal dialysis: Secondary | ICD-10-CM | POA: Diagnosis not present

## 2020-01-30 DIAGNOSIS — E1122 Type 2 diabetes mellitus with diabetic chronic kidney disease: Secondary | ICD-10-CM | POA: Diagnosis not present

## 2020-01-30 DIAGNOSIS — N186 End stage renal disease: Secondary | ICD-10-CM | POA: Diagnosis not present

## 2020-01-31 DIAGNOSIS — E039 Hypothyroidism, unspecified: Secondary | ICD-10-CM | POA: Diagnosis not present

## 2020-01-31 DIAGNOSIS — Z992 Dependence on renal dialysis: Secondary | ICD-10-CM | POA: Diagnosis not present

## 2020-01-31 DIAGNOSIS — D631 Anemia in chronic kidney disease: Secondary | ICD-10-CM | POA: Diagnosis not present

## 2020-01-31 DIAGNOSIS — N2581 Secondary hyperparathyroidism of renal origin: Secondary | ICD-10-CM | POA: Diagnosis not present

## 2020-01-31 DIAGNOSIS — D509 Iron deficiency anemia, unspecified: Secondary | ICD-10-CM | POA: Diagnosis not present

## 2020-01-31 DIAGNOSIS — N186 End stage renal disease: Secondary | ICD-10-CM | POA: Diagnosis not present

## 2020-01-31 DIAGNOSIS — D689 Coagulation defect, unspecified: Secondary | ICD-10-CM | POA: Diagnosis not present

## 2020-02-02 DIAGNOSIS — D689 Coagulation defect, unspecified: Secondary | ICD-10-CM | POA: Diagnosis not present

## 2020-02-02 DIAGNOSIS — Z992 Dependence on renal dialysis: Secondary | ICD-10-CM | POA: Diagnosis not present

## 2020-02-02 DIAGNOSIS — E039 Hypothyroidism, unspecified: Secondary | ICD-10-CM | POA: Diagnosis not present

## 2020-02-02 DIAGNOSIS — D509 Iron deficiency anemia, unspecified: Secondary | ICD-10-CM | POA: Diagnosis not present

## 2020-02-02 DIAGNOSIS — N186 End stage renal disease: Secondary | ICD-10-CM | POA: Diagnosis not present

## 2020-02-02 DIAGNOSIS — N2581 Secondary hyperparathyroidism of renal origin: Secondary | ICD-10-CM | POA: Diagnosis not present

## 2020-02-02 DIAGNOSIS — D631 Anemia in chronic kidney disease: Secondary | ICD-10-CM | POA: Diagnosis not present

## 2020-02-04 DIAGNOSIS — N2581 Secondary hyperparathyroidism of renal origin: Secondary | ICD-10-CM | POA: Diagnosis not present

## 2020-02-04 DIAGNOSIS — D509 Iron deficiency anemia, unspecified: Secondary | ICD-10-CM | POA: Diagnosis not present

## 2020-02-04 DIAGNOSIS — Z992 Dependence on renal dialysis: Secondary | ICD-10-CM | POA: Diagnosis not present

## 2020-02-04 DIAGNOSIS — N186 End stage renal disease: Secondary | ICD-10-CM | POA: Diagnosis not present

## 2020-02-04 DIAGNOSIS — D631 Anemia in chronic kidney disease: Secondary | ICD-10-CM | POA: Diagnosis not present

## 2020-02-04 DIAGNOSIS — E039 Hypothyroidism, unspecified: Secondary | ICD-10-CM | POA: Diagnosis not present

## 2020-02-04 DIAGNOSIS — D689 Coagulation defect, unspecified: Secondary | ICD-10-CM | POA: Diagnosis not present

## 2020-02-07 DIAGNOSIS — D689 Coagulation defect, unspecified: Secondary | ICD-10-CM | POA: Diagnosis not present

## 2020-02-07 DIAGNOSIS — N186 End stage renal disease: Secondary | ICD-10-CM | POA: Diagnosis not present

## 2020-02-07 DIAGNOSIS — N2581 Secondary hyperparathyroidism of renal origin: Secondary | ICD-10-CM | POA: Diagnosis not present

## 2020-02-07 DIAGNOSIS — E039 Hypothyroidism, unspecified: Secondary | ICD-10-CM | POA: Diagnosis not present

## 2020-02-07 DIAGNOSIS — D631 Anemia in chronic kidney disease: Secondary | ICD-10-CM | POA: Diagnosis not present

## 2020-02-07 DIAGNOSIS — D509 Iron deficiency anemia, unspecified: Secondary | ICD-10-CM | POA: Diagnosis not present

## 2020-02-07 DIAGNOSIS — Z992 Dependence on renal dialysis: Secondary | ICD-10-CM | POA: Diagnosis not present

## 2020-02-08 ENCOUNTER — Encounter: Admit: 2020-02-08 | Discharge: 2020-02-09 | Payer: MEDICARE

## 2020-02-08 ENCOUNTER — Encounter: Admit: 2020-02-08 | Discharge: 2020-02-08 | Payer: MEDICARE | Attending: Nephrology | Primary: Nephrology

## 2020-02-08 ENCOUNTER — Encounter: Admit: 2020-02-08 | Discharge: 2020-02-08 | Payer: MEDICARE

## 2020-02-08 ENCOUNTER — Ambulatory Visit: Admit: 2020-02-08 | Discharge: 2020-02-08 | Payer: MEDICARE | Attending: Registered" | Primary: Registered"

## 2020-02-08 DIAGNOSIS — I2510 Atherosclerotic heart disease of native coronary artery without angina pectoris: Principal | ICD-10-CM

## 2020-02-08 DIAGNOSIS — E11319 Type 2 diabetes mellitus with unspecified diabetic retinopathy without macular edema: Principal | ICD-10-CM

## 2020-02-08 DIAGNOSIS — Z6841 Body Mass Index (BMI) 40.0 and over, adult: Principal | ICD-10-CM

## 2020-02-08 DIAGNOSIS — N186 End stage renal disease: Principal | ICD-10-CM

## 2020-02-08 DIAGNOSIS — Z89412 Acquired absence of left great toe: Principal | ICD-10-CM

## 2020-02-08 DIAGNOSIS — E1151 Type 2 diabetes mellitus with diabetic peripheral angiopathy without gangrene: Principal | ICD-10-CM

## 2020-02-08 DIAGNOSIS — I120 Hypertensive chronic kidney disease with stage 5 chronic kidney disease or end stage renal disease: Principal | ICD-10-CM

## 2020-02-08 DIAGNOSIS — E1142 Type 2 diabetes mellitus with diabetic polyneuropathy: Principal | ICD-10-CM

## 2020-02-08 DIAGNOSIS — K8020 Calculus of gallbladder without cholecystitis without obstruction: Principal | ICD-10-CM

## 2020-02-08 DIAGNOSIS — G4733 Obstructive sleep apnea (adult) (pediatric): Principal | ICD-10-CM

## 2020-02-08 DIAGNOSIS — Z794 Long term (current) use of insulin: Principal | ICD-10-CM

## 2020-02-08 DIAGNOSIS — Z01818 Encounter for other preprocedural examination: Principal | ICD-10-CM

## 2020-02-08 DIAGNOSIS — Z79899 Other long term (current) drug therapy: Principal | ICD-10-CM

## 2020-02-08 DIAGNOSIS — R911 Solitary pulmonary nodule: Principal | ICD-10-CM

## 2020-02-08 DIAGNOSIS — E1122 Type 2 diabetes mellitus with diabetic chronic kidney disease: Principal | ICD-10-CM

## 2020-02-08 DIAGNOSIS — I5030 Unspecified diastolic (congestive) heart failure: Principal | ICD-10-CM

## 2020-02-08 DIAGNOSIS — Z85528 Personal history of other malignant neoplasm of kidney: Principal | ICD-10-CM

## 2020-02-08 DIAGNOSIS — Z905 Acquired absence of kidney: Principal | ICD-10-CM

## 2020-02-08 DIAGNOSIS — Z955 Presence of coronary angioplasty implant and graft: Principal | ICD-10-CM

## 2020-02-08 DIAGNOSIS — Z9989 Dependence on other enabling machines and devices: Principal | ICD-10-CM

## 2020-02-08 DIAGNOSIS — E1121 Type 2 diabetes mellitus with diabetic nephropathy: Principal | ICD-10-CM

## 2020-02-08 DIAGNOSIS — I10 Essential (primary) hypertension: Principal | ICD-10-CM

## 2020-02-08 MED ORDER — SEMAGLUTIDE 0.25 MG OR 0.5 MG (2 MG/1.5 ML) SUBCUTANEOUS PEN INJECTOR
11 refills | 0 days | Status: CP
Start: 2020-02-08 — End: ?

## 2020-02-09 DIAGNOSIS — N186 End stage renal disease: Secondary | ICD-10-CM | POA: Diagnosis not present

## 2020-02-09 DIAGNOSIS — D689 Coagulation defect, unspecified: Secondary | ICD-10-CM | POA: Diagnosis not present

## 2020-02-09 DIAGNOSIS — E039 Hypothyroidism, unspecified: Secondary | ICD-10-CM | POA: Diagnosis not present

## 2020-02-09 DIAGNOSIS — D509 Iron deficiency anemia, unspecified: Secondary | ICD-10-CM | POA: Diagnosis not present

## 2020-02-09 DIAGNOSIS — D631 Anemia in chronic kidney disease: Secondary | ICD-10-CM | POA: Diagnosis not present

## 2020-02-09 DIAGNOSIS — Z992 Dependence on renal dialysis: Secondary | ICD-10-CM | POA: Diagnosis not present

## 2020-02-09 DIAGNOSIS — N2581 Secondary hyperparathyroidism of renal origin: Secondary | ICD-10-CM | POA: Diagnosis not present

## 2020-02-11 DIAGNOSIS — N2581 Secondary hyperparathyroidism of renal origin: Secondary | ICD-10-CM | POA: Diagnosis not present

## 2020-02-11 DIAGNOSIS — D631 Anemia in chronic kidney disease: Secondary | ICD-10-CM | POA: Diagnosis not present

## 2020-02-11 DIAGNOSIS — Z992 Dependence on renal dialysis: Secondary | ICD-10-CM | POA: Diagnosis not present

## 2020-02-11 DIAGNOSIS — E039 Hypothyroidism, unspecified: Secondary | ICD-10-CM | POA: Diagnosis not present

## 2020-02-11 DIAGNOSIS — D509 Iron deficiency anemia, unspecified: Secondary | ICD-10-CM | POA: Diagnosis not present

## 2020-02-11 DIAGNOSIS — N186 End stage renal disease: Secondary | ICD-10-CM | POA: Diagnosis not present

## 2020-02-11 DIAGNOSIS — D689 Coagulation defect, unspecified: Secondary | ICD-10-CM | POA: Diagnosis not present

## 2020-02-14 DIAGNOSIS — E781 Pure hyperglyceridemia: Secondary | ICD-10-CM | POA: Diagnosis not present

## 2020-02-14 DIAGNOSIS — E1122 Type 2 diabetes mellitus with diabetic chronic kidney disease: Secondary | ICD-10-CM | POA: Diagnosis not present

## 2020-02-14 DIAGNOSIS — D631 Anemia in chronic kidney disease: Secondary | ICD-10-CM | POA: Diagnosis not present

## 2020-02-14 DIAGNOSIS — E039 Hypothyroidism, unspecified: Secondary | ICD-10-CM | POA: Diagnosis not present

## 2020-02-14 DIAGNOSIS — D689 Coagulation defect, unspecified: Secondary | ICD-10-CM | POA: Diagnosis not present

## 2020-02-14 DIAGNOSIS — N2581 Secondary hyperparathyroidism of renal origin: Secondary | ICD-10-CM | POA: Diagnosis not present

## 2020-02-14 DIAGNOSIS — N186 End stage renal disease: Secondary | ICD-10-CM | POA: Diagnosis not present

## 2020-02-14 DIAGNOSIS — I10 Essential (primary) hypertension: Secondary | ICD-10-CM | POA: Diagnosis not present

## 2020-02-14 DIAGNOSIS — D509 Iron deficiency anemia, unspecified: Secondary | ICD-10-CM | POA: Diagnosis not present

## 2020-02-14 DIAGNOSIS — E1143 Type 2 diabetes mellitus with diabetic autonomic (poly)neuropathy: Secondary | ICD-10-CM | POA: Diagnosis not present

## 2020-02-14 DIAGNOSIS — Z992 Dependence on renal dialysis: Secondary | ICD-10-CM | POA: Diagnosis not present

## 2020-02-16 DIAGNOSIS — D631 Anemia in chronic kidney disease: Secondary | ICD-10-CM | POA: Diagnosis not present

## 2020-02-16 DIAGNOSIS — D689 Coagulation defect, unspecified: Secondary | ICD-10-CM | POA: Diagnosis not present

## 2020-02-16 DIAGNOSIS — Z992 Dependence on renal dialysis: Secondary | ICD-10-CM | POA: Diagnosis not present

## 2020-02-16 DIAGNOSIS — D509 Iron deficiency anemia, unspecified: Secondary | ICD-10-CM | POA: Diagnosis not present

## 2020-02-16 DIAGNOSIS — N186 End stage renal disease: Secondary | ICD-10-CM | POA: Diagnosis not present

## 2020-02-16 DIAGNOSIS — N2581 Secondary hyperparathyroidism of renal origin: Secondary | ICD-10-CM | POA: Diagnosis not present

## 2020-02-16 DIAGNOSIS — E039 Hypothyroidism, unspecified: Secondary | ICD-10-CM | POA: Diagnosis not present

## 2020-02-18 DIAGNOSIS — N186 End stage renal disease: Secondary | ICD-10-CM | POA: Diagnosis not present

## 2020-02-18 DIAGNOSIS — D689 Coagulation defect, unspecified: Secondary | ICD-10-CM | POA: Diagnosis not present

## 2020-02-18 DIAGNOSIS — D631 Anemia in chronic kidney disease: Secondary | ICD-10-CM | POA: Diagnosis not present

## 2020-02-18 DIAGNOSIS — E039 Hypothyroidism, unspecified: Secondary | ICD-10-CM | POA: Diagnosis not present

## 2020-02-18 DIAGNOSIS — D509 Iron deficiency anemia, unspecified: Secondary | ICD-10-CM | POA: Diagnosis not present

## 2020-02-18 DIAGNOSIS — N2581 Secondary hyperparathyroidism of renal origin: Secondary | ICD-10-CM | POA: Diagnosis not present

## 2020-02-18 DIAGNOSIS — Z992 Dependence on renal dialysis: Secondary | ICD-10-CM | POA: Diagnosis not present

## 2020-02-20 DIAGNOSIS — E039 Hypothyroidism, unspecified: Secondary | ICD-10-CM | POA: Diagnosis not present

## 2020-02-20 DIAGNOSIS — Z992 Dependence on renal dialysis: Secondary | ICD-10-CM | POA: Diagnosis not present

## 2020-02-20 DIAGNOSIS — N2581 Secondary hyperparathyroidism of renal origin: Secondary | ICD-10-CM | POA: Diagnosis not present

## 2020-02-20 DIAGNOSIS — D631 Anemia in chronic kidney disease: Secondary | ICD-10-CM | POA: Diagnosis not present

## 2020-02-20 DIAGNOSIS — D509 Iron deficiency anemia, unspecified: Secondary | ICD-10-CM | POA: Diagnosis not present

## 2020-02-20 DIAGNOSIS — D689 Coagulation defect, unspecified: Secondary | ICD-10-CM | POA: Diagnosis not present

## 2020-02-20 DIAGNOSIS — N186 End stage renal disease: Secondary | ICD-10-CM | POA: Diagnosis not present

## 2020-02-22 DIAGNOSIS — D689 Coagulation defect, unspecified: Secondary | ICD-10-CM | POA: Diagnosis not present

## 2020-02-22 DIAGNOSIS — Z992 Dependence on renal dialysis: Secondary | ICD-10-CM | POA: Diagnosis not present

## 2020-02-22 DIAGNOSIS — N2581 Secondary hyperparathyroidism of renal origin: Secondary | ICD-10-CM | POA: Diagnosis not present

## 2020-02-22 DIAGNOSIS — N186 End stage renal disease: Secondary | ICD-10-CM | POA: Diagnosis not present

## 2020-02-22 DIAGNOSIS — D631 Anemia in chronic kidney disease: Secondary | ICD-10-CM | POA: Diagnosis not present

## 2020-02-22 DIAGNOSIS — D509 Iron deficiency anemia, unspecified: Secondary | ICD-10-CM | POA: Diagnosis not present

## 2020-02-22 DIAGNOSIS — E039 Hypothyroidism, unspecified: Secondary | ICD-10-CM | POA: Diagnosis not present

## 2020-02-25 DIAGNOSIS — E039 Hypothyroidism, unspecified: Secondary | ICD-10-CM | POA: Diagnosis not present

## 2020-02-25 DIAGNOSIS — N186 End stage renal disease: Secondary | ICD-10-CM | POA: Diagnosis not present

## 2020-02-25 DIAGNOSIS — N2581 Secondary hyperparathyroidism of renal origin: Secondary | ICD-10-CM | POA: Diagnosis not present

## 2020-02-25 DIAGNOSIS — D509 Iron deficiency anemia, unspecified: Secondary | ICD-10-CM | POA: Diagnosis not present

## 2020-02-25 DIAGNOSIS — Z992 Dependence on renal dialysis: Secondary | ICD-10-CM | POA: Diagnosis not present

## 2020-02-25 DIAGNOSIS — D631 Anemia in chronic kidney disease: Secondary | ICD-10-CM | POA: Diagnosis not present

## 2020-02-25 DIAGNOSIS — D689 Coagulation defect, unspecified: Secondary | ICD-10-CM | POA: Diagnosis not present

## 2020-02-28 DIAGNOSIS — Z992 Dependence on renal dialysis: Secondary | ICD-10-CM | POA: Diagnosis not present

## 2020-02-28 DIAGNOSIS — E039 Hypothyroidism, unspecified: Secondary | ICD-10-CM | POA: Diagnosis not present

## 2020-02-28 DIAGNOSIS — D689 Coagulation defect, unspecified: Secondary | ICD-10-CM | POA: Diagnosis not present

## 2020-02-28 DIAGNOSIS — D509 Iron deficiency anemia, unspecified: Secondary | ICD-10-CM | POA: Diagnosis not present

## 2020-02-28 DIAGNOSIS — D631 Anemia in chronic kidney disease: Secondary | ICD-10-CM | POA: Diagnosis not present

## 2020-02-28 DIAGNOSIS — N2581 Secondary hyperparathyroidism of renal origin: Secondary | ICD-10-CM | POA: Diagnosis not present

## 2020-02-28 DIAGNOSIS — N186 End stage renal disease: Secondary | ICD-10-CM | POA: Diagnosis not present

## 2020-02-29 DIAGNOSIS — E1122 Type 2 diabetes mellitus with diabetic chronic kidney disease: Secondary | ICD-10-CM | POA: Diagnosis not present

## 2020-02-29 DIAGNOSIS — N186 End stage renal disease: Secondary | ICD-10-CM | POA: Diagnosis not present

## 2020-02-29 DIAGNOSIS — Z992 Dependence on renal dialysis: Secondary | ICD-10-CM | POA: Diagnosis not present

## 2020-04-01 DIAGNOSIS — Z940 Kidney transplant status: Secondary | ICD-10-CM

## 2020-04-01 HISTORY — DX: Kidney transplant status: Z94.0

## 2020-04-03 DIAGNOSIS — D689 Coagulation defect, unspecified: Secondary | ICD-10-CM | POA: Diagnosis not present

## 2020-04-03 DIAGNOSIS — N186 End stage renal disease: Secondary | ICD-10-CM | POA: Diagnosis not present

## 2020-04-03 DIAGNOSIS — N2581 Secondary hyperparathyroidism of renal origin: Secondary | ICD-10-CM | POA: Diagnosis not present

## 2020-04-03 DIAGNOSIS — Z992 Dependence on renal dialysis: Secondary | ICD-10-CM | POA: Diagnosis not present

## 2020-04-03 DIAGNOSIS — D509 Iron deficiency anemia, unspecified: Secondary | ICD-10-CM | POA: Diagnosis not present

## 2020-04-03 DIAGNOSIS — E1122 Type 2 diabetes mellitus with diabetic chronic kidney disease: Secondary | ICD-10-CM | POA: Diagnosis not present

## 2020-04-03 DIAGNOSIS — E039 Hypothyroidism, unspecified: Secondary | ICD-10-CM | POA: Diagnosis not present

## 2020-04-03 DIAGNOSIS — D631 Anemia in chronic kidney disease: Secondary | ICD-10-CM | POA: Diagnosis not present

## 2020-04-05 ENCOUNTER — Ambulatory Visit (INDEPENDENT_AMBULATORY_CARE_PROVIDER_SITE_OTHER): Payer: Medicare Other

## 2020-04-05 ENCOUNTER — Other Ambulatory Visit: Payer: Self-pay | Admitting: Podiatry

## 2020-04-05 ENCOUNTER — Ambulatory Visit: Payer: Medicare Other | Admitting: Podiatry

## 2020-04-05 ENCOUNTER — Other Ambulatory Visit: Payer: Self-pay

## 2020-04-05 DIAGNOSIS — M7750 Other enthesopathy of unspecified foot: Secondary | ICD-10-CM

## 2020-04-05 DIAGNOSIS — N2581 Secondary hyperparathyroidism of renal origin: Secondary | ICD-10-CM | POA: Diagnosis not present

## 2020-04-05 DIAGNOSIS — M19072 Primary osteoarthritis, left ankle and foot: Secondary | ICD-10-CM

## 2020-04-05 DIAGNOSIS — E1140 Type 2 diabetes mellitus with diabetic neuropathy, unspecified: Secondary | ICD-10-CM

## 2020-04-05 DIAGNOSIS — E039 Hypothyroidism, unspecified: Secondary | ICD-10-CM | POA: Diagnosis not present

## 2020-04-05 DIAGNOSIS — L97522 Non-pressure chronic ulcer of other part of left foot with fat layer exposed: Secondary | ICD-10-CM | POA: Diagnosis not present

## 2020-04-05 DIAGNOSIS — Z992 Dependence on renal dialysis: Secondary | ICD-10-CM | POA: Diagnosis not present

## 2020-04-05 DIAGNOSIS — N186 End stage renal disease: Secondary | ICD-10-CM | POA: Diagnosis not present

## 2020-04-05 DIAGNOSIS — M7752 Other enthesopathy of left foot: Secondary | ICD-10-CM | POA: Diagnosis not present

## 2020-04-05 DIAGNOSIS — E1122 Type 2 diabetes mellitus with diabetic chronic kidney disease: Secondary | ICD-10-CM | POA: Diagnosis not present

## 2020-04-05 DIAGNOSIS — D631 Anemia in chronic kidney disease: Secondary | ICD-10-CM | POA: Diagnosis not present

## 2020-04-05 DIAGNOSIS — D509 Iron deficiency anemia, unspecified: Secondary | ICD-10-CM | POA: Diagnosis not present

## 2020-04-05 DIAGNOSIS — D689 Coagulation defect, unspecified: Secondary | ICD-10-CM | POA: Diagnosis not present

## 2020-04-05 MED ORDER — GENTAMICIN SULFATE 0.1 % EX CREA: 1.0000 "application " | TOPICAL_CREAM | Freq: Two times a day (BID) | CUTANEOUS | 1 refills | Status: DC

## 2020-04-05 MED ORDER — OXYCODONE-ACETAMINOPHEN 5-325 MG PO TABS: 1.0000 | ORAL_TABLET | Freq: Three times a day (TID) | ORAL | 0 refills | Status: DC | PRN

## 2020-04-07 DIAGNOSIS — N186 End stage renal disease: Secondary | ICD-10-CM | POA: Diagnosis not present

## 2020-04-07 DIAGNOSIS — E039 Hypothyroidism, unspecified: Secondary | ICD-10-CM | POA: Diagnosis not present

## 2020-04-07 DIAGNOSIS — D509 Iron deficiency anemia, unspecified: Secondary | ICD-10-CM | POA: Diagnosis not present

## 2020-04-07 DIAGNOSIS — D631 Anemia in chronic kidney disease: Secondary | ICD-10-CM | POA: Diagnosis not present

## 2020-04-07 DIAGNOSIS — Z992 Dependence on renal dialysis: Secondary | ICD-10-CM | POA: Diagnosis not present

## 2020-04-07 DIAGNOSIS — E1122 Type 2 diabetes mellitus with diabetic chronic kidney disease: Secondary | ICD-10-CM | POA: Diagnosis not present

## 2020-04-07 DIAGNOSIS — N2581 Secondary hyperparathyroidism of renal origin: Secondary | ICD-10-CM | POA: Diagnosis not present

## 2020-04-07 DIAGNOSIS — D689 Coagulation defect, unspecified: Secondary | ICD-10-CM | POA: Diagnosis not present

## 2020-04-10 DIAGNOSIS — E039 Hypothyroidism, unspecified: Secondary | ICD-10-CM | POA: Diagnosis not present

## 2020-04-10 DIAGNOSIS — N2581 Secondary hyperparathyroidism of renal origin: Secondary | ICD-10-CM | POA: Diagnosis not present

## 2020-04-10 DIAGNOSIS — D689 Coagulation defect, unspecified: Secondary | ICD-10-CM | POA: Diagnosis not present

## 2020-04-10 DIAGNOSIS — Z992 Dependence on renal dialysis: Secondary | ICD-10-CM | POA: Diagnosis not present

## 2020-04-10 DIAGNOSIS — D631 Anemia in chronic kidney disease: Secondary | ICD-10-CM | POA: Diagnosis not present

## 2020-04-10 DIAGNOSIS — D509 Iron deficiency anemia, unspecified: Secondary | ICD-10-CM | POA: Diagnosis not present

## 2020-04-10 DIAGNOSIS — N186 End stage renal disease: Secondary | ICD-10-CM | POA: Diagnosis not present

## 2020-04-10 DIAGNOSIS — E1122 Type 2 diabetes mellitus with diabetic chronic kidney disease: Secondary | ICD-10-CM | POA: Diagnosis not present

## 2020-04-12 DIAGNOSIS — E1122 Type 2 diabetes mellitus with diabetic chronic kidney disease: Secondary | ICD-10-CM | POA: Diagnosis not present

## 2020-04-12 DIAGNOSIS — D689 Coagulation defect, unspecified: Secondary | ICD-10-CM | POA: Diagnosis not present

## 2020-04-12 DIAGNOSIS — E039 Hypothyroidism, unspecified: Secondary | ICD-10-CM | POA: Diagnosis not present

## 2020-04-12 DIAGNOSIS — D509 Iron deficiency anemia, unspecified: Secondary | ICD-10-CM | POA: Diagnosis not present

## 2020-04-12 DIAGNOSIS — Z992 Dependence on renal dialysis: Secondary | ICD-10-CM | POA: Diagnosis not present

## 2020-04-12 DIAGNOSIS — D631 Anemia in chronic kidney disease: Secondary | ICD-10-CM | POA: Diagnosis not present

## 2020-04-12 DIAGNOSIS — N2581 Secondary hyperparathyroidism of renal origin: Secondary | ICD-10-CM | POA: Diagnosis not present

## 2020-04-12 DIAGNOSIS — N186 End stage renal disease: Secondary | ICD-10-CM | POA: Diagnosis not present

## 2020-04-14 DIAGNOSIS — E039 Hypothyroidism, unspecified: Secondary | ICD-10-CM | POA: Diagnosis not present

## 2020-04-14 DIAGNOSIS — N2581 Secondary hyperparathyroidism of renal origin: Secondary | ICD-10-CM | POA: Diagnosis not present

## 2020-04-14 DIAGNOSIS — D631 Anemia in chronic kidney disease: Secondary | ICD-10-CM | POA: Diagnosis not present

## 2020-04-14 DIAGNOSIS — E1122 Type 2 diabetes mellitus with diabetic chronic kidney disease: Secondary | ICD-10-CM | POA: Diagnosis not present

## 2020-04-14 DIAGNOSIS — D509 Iron deficiency anemia, unspecified: Secondary | ICD-10-CM | POA: Diagnosis not present

## 2020-04-14 DIAGNOSIS — Z992 Dependence on renal dialysis: Secondary | ICD-10-CM | POA: Diagnosis not present

## 2020-04-14 DIAGNOSIS — N186 End stage renal disease: Secondary | ICD-10-CM | POA: Diagnosis not present

## 2020-04-14 DIAGNOSIS — D689 Coagulation defect, unspecified: Secondary | ICD-10-CM | POA: Diagnosis not present

## 2020-04-19 DIAGNOSIS — D631 Anemia in chronic kidney disease: Secondary | ICD-10-CM | POA: Diagnosis not present

## 2020-04-19 DIAGNOSIS — D689 Coagulation defect, unspecified: Secondary | ICD-10-CM | POA: Diagnosis not present

## 2020-04-19 DIAGNOSIS — N2581 Secondary hyperparathyroidism of renal origin: Secondary | ICD-10-CM | POA: Diagnosis not present

## 2020-04-19 DIAGNOSIS — D509 Iron deficiency anemia, unspecified: Secondary | ICD-10-CM | POA: Diagnosis not present

## 2020-04-19 DIAGNOSIS — E039 Hypothyroidism, unspecified: Secondary | ICD-10-CM | POA: Diagnosis not present

## 2020-04-19 DIAGNOSIS — N186 End stage renal disease: Secondary | ICD-10-CM | POA: Diagnosis not present

## 2020-04-19 DIAGNOSIS — Z992 Dependence on renal dialysis: Secondary | ICD-10-CM | POA: Diagnosis not present

## 2020-04-19 DIAGNOSIS — E1122 Type 2 diabetes mellitus with diabetic chronic kidney disease: Secondary | ICD-10-CM | POA: Diagnosis not present

## 2020-04-21 DIAGNOSIS — Z992 Dependence on renal dialysis: Secondary | ICD-10-CM | POA: Diagnosis not present

## 2020-04-21 DIAGNOSIS — D631 Anemia in chronic kidney disease: Secondary | ICD-10-CM | POA: Diagnosis not present

## 2020-04-21 DIAGNOSIS — D689 Coagulation defect, unspecified: Secondary | ICD-10-CM | POA: Diagnosis not present

## 2020-04-21 DIAGNOSIS — D509 Iron deficiency anemia, unspecified: Secondary | ICD-10-CM | POA: Diagnosis not present

## 2020-04-21 DIAGNOSIS — E039 Hypothyroidism, unspecified: Secondary | ICD-10-CM | POA: Diagnosis not present

## 2020-04-21 DIAGNOSIS — E1122 Type 2 diabetes mellitus with diabetic chronic kidney disease: Secondary | ICD-10-CM | POA: Diagnosis not present

## 2020-04-21 DIAGNOSIS — N2581 Secondary hyperparathyroidism of renal origin: Secondary | ICD-10-CM | POA: Diagnosis not present

## 2020-04-21 DIAGNOSIS — N186 End stage renal disease: Secondary | ICD-10-CM | POA: Diagnosis not present
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
## 2020-04-24 DIAGNOSIS — E039 Hypothyroidism, unspecified: Secondary | ICD-10-CM | POA: Diagnosis not present

## 2020-04-24 DIAGNOSIS — N2581 Secondary hyperparathyroidism of renal origin: Secondary | ICD-10-CM | POA: Diagnosis not present

## 2020-04-24 DIAGNOSIS — N186 End stage renal disease: Secondary | ICD-10-CM | POA: Diagnosis not present

## 2020-04-24 DIAGNOSIS — E1122 Type 2 diabetes mellitus with diabetic chronic kidney disease: Secondary | ICD-10-CM | POA: Diagnosis not present

## 2020-04-24 DIAGNOSIS — Z992 Dependence on renal dialysis: Secondary | ICD-10-CM | POA: Diagnosis not present

## 2020-04-24 DIAGNOSIS — D631 Anemia in chronic kidney disease: Secondary | ICD-10-CM | POA: Diagnosis not present

## 2020-04-24 DIAGNOSIS — D689 Coagulation defect, unspecified: Secondary | ICD-10-CM | POA: Diagnosis not present

## 2020-04-24 DIAGNOSIS — D509 Iron deficiency anemia, unspecified: Secondary | ICD-10-CM | POA: Diagnosis not present

## 2020-04-26 DIAGNOSIS — Z992 Dependence on renal dialysis: Secondary | ICD-10-CM | POA: Diagnosis not present

## 2020-04-26 DIAGNOSIS — D689 Coagulation defect, unspecified: Secondary | ICD-10-CM | POA: Diagnosis not present

## 2020-04-26 DIAGNOSIS — D631 Anemia in chronic kidney disease: Secondary | ICD-10-CM | POA: Diagnosis not present

## 2020-04-26 DIAGNOSIS — N2581 Secondary hyperparathyroidism of renal origin: Secondary | ICD-10-CM | POA: Diagnosis not present

## 2020-04-26 DIAGNOSIS — E1122 Type 2 diabetes mellitus with diabetic chronic kidney disease: Secondary | ICD-10-CM | POA: Diagnosis not present

## 2020-04-26 DIAGNOSIS — D509 Iron deficiency anemia, unspecified: Secondary | ICD-10-CM | POA: Diagnosis not present

## 2020-04-26 DIAGNOSIS — E039 Hypothyroidism, unspecified: Secondary | ICD-10-CM | POA: Diagnosis not present

## 2020-04-26 DIAGNOSIS — N186 End stage renal disease: Secondary | ICD-10-CM | POA: Diagnosis not present

## 2020-04-28 DIAGNOSIS — E1122 Type 2 diabetes mellitus with diabetic chronic kidney disease: Secondary | ICD-10-CM | POA: Diagnosis not present

## 2020-04-28 DIAGNOSIS — D689 Coagulation defect, unspecified: Secondary | ICD-10-CM | POA: Diagnosis not present

## 2020-04-28 DIAGNOSIS — D631 Anemia in chronic kidney disease: Secondary | ICD-10-CM | POA: Diagnosis not present

## 2020-04-28 DIAGNOSIS — Z992 Dependence on renal dialysis: Secondary | ICD-10-CM | POA: Diagnosis not present

## 2020-04-28 DIAGNOSIS — N186 End stage renal disease: Secondary | ICD-10-CM | POA: Diagnosis not present

## 2020-04-28 DIAGNOSIS — D509 Iron deficiency anemia, unspecified: Secondary | ICD-10-CM | POA: Diagnosis not present

## 2020-04-28 DIAGNOSIS — E039 Hypothyroidism, unspecified: Secondary | ICD-10-CM | POA: Diagnosis not present

## 2020-04-28 DIAGNOSIS — N2581 Secondary hyperparathyroidism of renal origin: Secondary | ICD-10-CM | POA: Diagnosis not present

## 2020-05-01 DIAGNOSIS — N186 End stage renal disease: Secondary | ICD-10-CM | POA: Diagnosis not present

## 2020-05-01 DIAGNOSIS — D689 Coagulation defect, unspecified: Secondary | ICD-10-CM | POA: Diagnosis not present

## 2020-05-01 DIAGNOSIS — E1122 Type 2 diabetes mellitus with diabetic chronic kidney disease: Secondary | ICD-10-CM | POA: Diagnosis not present

## 2020-05-01 DIAGNOSIS — E039 Hypothyroidism, unspecified: Secondary | ICD-10-CM | POA: Diagnosis not present

## 2020-05-01 DIAGNOSIS — D631 Anemia in chronic kidney disease: Secondary | ICD-10-CM | POA: Diagnosis not present

## 2020-05-01 DIAGNOSIS — Z992 Dependence on renal dialysis: Secondary | ICD-10-CM | POA: Diagnosis not present

## 2020-05-01 DIAGNOSIS — D509 Iron deficiency anemia, unspecified: Secondary | ICD-10-CM | POA: Diagnosis not present

## 2020-05-01 DIAGNOSIS — N2581 Secondary hyperparathyroidism of renal origin: Secondary | ICD-10-CM | POA: Diagnosis not present

## 2020-05-03 DIAGNOSIS — E039 Hypothyroidism, unspecified: Secondary | ICD-10-CM | POA: Diagnosis not present

## 2020-05-03 DIAGNOSIS — D509 Iron deficiency anemia, unspecified: Secondary | ICD-10-CM | POA: Diagnosis not present

## 2020-05-03 DIAGNOSIS — D689 Coagulation defect, unspecified: Secondary | ICD-10-CM | POA: Diagnosis not present

## 2020-05-03 DIAGNOSIS — D631 Anemia in chronic kidney disease: Secondary | ICD-10-CM | POA: Diagnosis not present

## 2020-05-03 DIAGNOSIS — N2581 Secondary hyperparathyroidism of renal origin: Secondary | ICD-10-CM | POA: Diagnosis not present

## 2020-05-03 DIAGNOSIS — N186 End stage renal disease: Secondary | ICD-10-CM | POA: Diagnosis not present

## 2020-05-03 DIAGNOSIS — Z992 Dependence on renal dialysis: Secondary | ICD-10-CM | POA: Diagnosis not present

## 2020-05-03 DIAGNOSIS — R52 Pain, unspecified: Secondary | ICD-10-CM | POA: Diagnosis not present

## 2020-05-05 DIAGNOSIS — N2581 Secondary hyperparathyroidism of renal origin: Secondary | ICD-10-CM | POA: Diagnosis not present

## 2020-05-05 DIAGNOSIS — Z992 Dependence on renal dialysis: Secondary | ICD-10-CM | POA: Diagnosis not present

## 2020-05-05 DIAGNOSIS — N186 End stage renal disease: Secondary | ICD-10-CM | POA: Diagnosis not present

## 2020-05-05 DIAGNOSIS — D509 Iron deficiency anemia, unspecified: Secondary | ICD-10-CM | POA: Diagnosis not present

## 2020-05-05 DIAGNOSIS — D631 Anemia in chronic kidney disease: Secondary | ICD-10-CM | POA: Diagnosis not present

## 2020-05-05 DIAGNOSIS — R52 Pain, unspecified: Secondary | ICD-10-CM | POA: Diagnosis not present

## 2020-05-05 DIAGNOSIS — D689 Coagulation defect, unspecified: Secondary | ICD-10-CM | POA: Diagnosis not present

## 2020-05-05 DIAGNOSIS — E039 Hypothyroidism, unspecified: Secondary | ICD-10-CM | POA: Diagnosis not present

## 2020-05-08 DIAGNOSIS — N186 End stage renal disease: Secondary | ICD-10-CM | POA: Diagnosis not present

## 2020-05-08 DIAGNOSIS — D631 Anemia in chronic kidney disease: Secondary | ICD-10-CM | POA: Diagnosis not present

## 2020-05-08 DIAGNOSIS — E039 Hypothyroidism, unspecified: Secondary | ICD-10-CM | POA: Diagnosis not present

## 2020-05-08 DIAGNOSIS — D689 Coagulation defect, unspecified: Secondary | ICD-10-CM | POA: Diagnosis not present

## 2020-05-08 DIAGNOSIS — R52 Pain, unspecified: Secondary | ICD-10-CM | POA: Diagnosis not present

## 2020-05-08 DIAGNOSIS — N2581 Secondary hyperparathyroidism of renal origin: Secondary | ICD-10-CM | POA: Diagnosis not present

## 2020-05-08 DIAGNOSIS — D509 Iron deficiency anemia, unspecified: Secondary | ICD-10-CM | POA: Diagnosis not present

## 2020-05-08 DIAGNOSIS — Z992 Dependence on renal dialysis: Secondary | ICD-10-CM | POA: Diagnosis not present

## 2020-05-10 DIAGNOSIS — Z992 Dependence on renal dialysis: Secondary | ICD-10-CM | POA: Diagnosis not present

## 2020-05-10 DIAGNOSIS — R52 Pain, unspecified: Secondary | ICD-10-CM | POA: Diagnosis not present

## 2020-05-10 DIAGNOSIS — E039 Hypothyroidism, unspecified: Secondary | ICD-10-CM | POA: Diagnosis not present

## 2020-05-10 DIAGNOSIS — N186 End stage renal disease: Secondary | ICD-10-CM | POA: Diagnosis not present

## 2020-05-10 DIAGNOSIS — D689 Coagulation defect, unspecified: Secondary | ICD-10-CM | POA: Diagnosis not present

## 2020-05-10 DIAGNOSIS — D509 Iron deficiency anemia, unspecified: Secondary | ICD-10-CM | POA: Diagnosis not present

## 2020-05-10 DIAGNOSIS — D631 Anemia in chronic kidney disease: Secondary | ICD-10-CM | POA: Diagnosis not present

## 2020-05-10 DIAGNOSIS — N2581 Secondary hyperparathyroidism of renal origin: Secondary | ICD-10-CM | POA: Diagnosis not present

## 2020-05-12 DIAGNOSIS — E039 Hypothyroidism, unspecified: Secondary | ICD-10-CM | POA: Diagnosis not present

## 2020-05-12 DIAGNOSIS — D509 Iron deficiency anemia, unspecified: Secondary | ICD-10-CM | POA: Diagnosis not present

## 2020-05-12 DIAGNOSIS — Z992 Dependence on renal dialysis: Secondary | ICD-10-CM | POA: Diagnosis not present

## 2020-05-12 DIAGNOSIS — D689 Coagulation defect, unspecified: Secondary | ICD-10-CM | POA: Diagnosis not present

## 2020-05-12 DIAGNOSIS — N2581 Secondary hyperparathyroidism of renal origin: Secondary | ICD-10-CM | POA: Diagnosis not present

## 2020-05-12 DIAGNOSIS — R52 Pain, unspecified: Secondary | ICD-10-CM | POA: Diagnosis not present

## 2020-05-12 DIAGNOSIS — N186 End stage renal disease: Secondary | ICD-10-CM | POA: Diagnosis not present

## 2020-05-12 DIAGNOSIS — D631 Anemia in chronic kidney disease: Secondary | ICD-10-CM | POA: Diagnosis not present

## 2020-05-15 DIAGNOSIS — E039 Hypothyroidism, unspecified: Secondary | ICD-10-CM | POA: Diagnosis not present

## 2020-05-15 DIAGNOSIS — D689 Coagulation defect, unspecified: Secondary | ICD-10-CM | POA: Diagnosis not present

## 2020-05-15 DIAGNOSIS — N2581 Secondary hyperparathyroidism of renal origin: Secondary | ICD-10-CM | POA: Diagnosis not present

## 2020-05-15 DIAGNOSIS — N186 End stage renal disease: Secondary | ICD-10-CM | POA: Diagnosis not present

## 2020-05-15 DIAGNOSIS — Z992 Dependence on renal dialysis: Secondary | ICD-10-CM | POA: Diagnosis not present

## 2020-05-15 DIAGNOSIS — D509 Iron deficiency anemia, unspecified: Secondary | ICD-10-CM | POA: Diagnosis not present

## 2020-05-15 DIAGNOSIS — R52 Pain, unspecified: Secondary | ICD-10-CM | POA: Diagnosis not present

## 2020-05-15 DIAGNOSIS — D631 Anemia in chronic kidney disease: Secondary | ICD-10-CM | POA: Diagnosis not present

## 2020-05-17 DIAGNOSIS — E039 Hypothyroidism, unspecified: Secondary | ICD-10-CM | POA: Diagnosis not present

## 2020-05-17 DIAGNOSIS — D631 Anemia in chronic kidney disease: Secondary | ICD-10-CM | POA: Diagnosis not present

## 2020-05-17 DIAGNOSIS — D689 Coagulation defect, unspecified: Secondary | ICD-10-CM | POA: Diagnosis not present

## 2020-05-17 DIAGNOSIS — N2581 Secondary hyperparathyroidism of renal origin: Secondary | ICD-10-CM | POA: Diagnosis not present

## 2020-05-17 DIAGNOSIS — D509 Iron deficiency anemia, unspecified: Secondary | ICD-10-CM | POA: Diagnosis not present

## 2020-05-17 DIAGNOSIS — R52 Pain, unspecified: Secondary | ICD-10-CM | POA: Diagnosis not present

## 2020-05-17 DIAGNOSIS — N186 End stage renal disease: Secondary | ICD-10-CM | POA: Diagnosis not present

## 2020-05-17 DIAGNOSIS — Z992 Dependence on renal dialysis: Secondary | ICD-10-CM | POA: Diagnosis not present

## 2020-05-18 ENCOUNTER — Encounter: Admit: 2020-05-18 | Discharge: 2020-05-19 | Payer: MEDICARE | Attending: Registered" | Primary: Registered"

## 2020-05-19 DIAGNOSIS — E039 Hypothyroidism, unspecified: Secondary | ICD-10-CM | POA: Diagnosis not present

## 2020-05-19 DIAGNOSIS — N2581 Secondary hyperparathyroidism of renal origin: Secondary | ICD-10-CM | POA: Diagnosis not present

## 2020-05-19 DIAGNOSIS — N186 End stage renal disease: Secondary | ICD-10-CM | POA: Diagnosis not present

## 2020-05-19 DIAGNOSIS — R52 Pain, unspecified: Secondary | ICD-10-CM | POA: Diagnosis not present

## 2020-05-19 DIAGNOSIS — Z992 Dependence on renal dialysis: Secondary | ICD-10-CM | POA: Diagnosis not present

## 2020-05-19 DIAGNOSIS — D689 Coagulation defect, unspecified: Secondary | ICD-10-CM | POA: Diagnosis not present

## 2020-05-19 DIAGNOSIS — D509 Iron deficiency anemia, unspecified: Secondary | ICD-10-CM | POA: Diagnosis not present

## 2020-05-19 DIAGNOSIS — D631 Anemia in chronic kidney disease: Secondary | ICD-10-CM | POA: Diagnosis not present

## 2020-05-22 DIAGNOSIS — Z992 Dependence on renal dialysis: Secondary | ICD-10-CM | POA: Diagnosis not present

## 2020-05-22 DIAGNOSIS — R52 Pain, unspecified: Secondary | ICD-10-CM | POA: Diagnosis not present

## 2020-05-22 DIAGNOSIS — D689 Coagulation defect, unspecified: Secondary | ICD-10-CM | POA: Diagnosis not present

## 2020-05-22 DIAGNOSIS — E039 Hypothyroidism, unspecified: Secondary | ICD-10-CM | POA: Diagnosis not present

## 2020-05-22 DIAGNOSIS — N186 End stage renal disease: Secondary | ICD-10-CM | POA: Diagnosis not present

## 2020-05-22 DIAGNOSIS — D631 Anemia in chronic kidney disease: Secondary | ICD-10-CM | POA: Diagnosis not present

## 2020-05-22 DIAGNOSIS — N2581 Secondary hyperparathyroidism of renal origin: Secondary | ICD-10-CM | POA: Diagnosis not present

## 2020-05-22 DIAGNOSIS — D509 Iron deficiency anemia, unspecified: Secondary | ICD-10-CM | POA: Diagnosis not present

## 2020-05-24 DIAGNOSIS — N186 End stage renal disease: Secondary | ICD-10-CM | POA: Diagnosis not present

## 2020-05-24 DIAGNOSIS — N2581 Secondary hyperparathyroidism of renal origin: Secondary | ICD-10-CM | POA: Diagnosis not present

## 2020-05-24 DIAGNOSIS — D509 Iron deficiency anemia, unspecified: Secondary | ICD-10-CM | POA: Diagnosis not present

## 2020-05-24 DIAGNOSIS — R52 Pain, unspecified: Secondary | ICD-10-CM | POA: Diagnosis not present

## 2020-05-24 DIAGNOSIS — E039 Hypothyroidism, unspecified: Secondary | ICD-10-CM | POA: Diagnosis not present

## 2020-05-24 DIAGNOSIS — Z992 Dependence on renal dialysis: Secondary | ICD-10-CM | POA: Diagnosis not present

## 2020-05-24 DIAGNOSIS — D631 Anemia in chronic kidney disease: Secondary | ICD-10-CM | POA: Diagnosis not present

## 2020-05-24 DIAGNOSIS — D689 Coagulation defect, unspecified: Secondary | ICD-10-CM | POA: Diagnosis not present

## 2020-05-26 DIAGNOSIS — D689 Coagulation defect, unspecified: Secondary | ICD-10-CM | POA: Diagnosis not present

## 2020-05-26 DIAGNOSIS — D509 Iron deficiency anemia, unspecified: Secondary | ICD-10-CM | POA: Diagnosis not present

## 2020-05-26 DIAGNOSIS — R52 Pain, unspecified: Secondary | ICD-10-CM | POA: Diagnosis not present

## 2020-05-26 DIAGNOSIS — N186 End stage renal disease: Secondary | ICD-10-CM | POA: Diagnosis not present

## 2020-05-26 DIAGNOSIS — N2581 Secondary hyperparathyroidism of renal origin: Secondary | ICD-10-CM | POA: Diagnosis not present

## 2020-05-26 DIAGNOSIS — E039 Hypothyroidism, unspecified: Secondary | ICD-10-CM | POA: Diagnosis not present

## 2020-05-26 DIAGNOSIS — Z992 Dependence on renal dialysis: Secondary | ICD-10-CM | POA: Diagnosis not present

## 2020-05-26 DIAGNOSIS — D631 Anemia in chronic kidney disease: Secondary | ICD-10-CM | POA: Diagnosis not present

## 2020-05-29 DIAGNOSIS — Z992 Dependence on renal dialysis: Secondary | ICD-10-CM | POA: Diagnosis not present

## 2020-05-29 DIAGNOSIS — D509 Iron deficiency anemia, unspecified: Secondary | ICD-10-CM | POA: Diagnosis not present

## 2020-05-29 DIAGNOSIS — D631 Anemia in chronic kidney disease: Secondary | ICD-10-CM | POA: Diagnosis not present

## 2020-05-29 DIAGNOSIS — E039 Hypothyroidism, unspecified: Secondary | ICD-10-CM | POA: Diagnosis not present

## 2020-05-29 DIAGNOSIS — D689 Coagulation defect, unspecified: Secondary | ICD-10-CM | POA: Diagnosis not present

## 2020-05-29 DIAGNOSIS — N2581 Secondary hyperparathyroidism of renal origin: Secondary | ICD-10-CM | POA: Diagnosis not present

## 2020-05-29 DIAGNOSIS — R52 Pain, unspecified: Secondary | ICD-10-CM | POA: Diagnosis not present

## 2020-05-29 DIAGNOSIS — E1122 Type 2 diabetes mellitus with diabetic chronic kidney disease: Secondary | ICD-10-CM | POA: Diagnosis not present

## 2020-05-29 DIAGNOSIS — N186 End stage renal disease: Secondary | ICD-10-CM | POA: Diagnosis not present

## 2020-05-29 IMAGING — CT CT NECK W/O CM
3 of 4 series · 12 of 33 positions shown, 14 images · non-contrast
Comparison: Chest radiographs 12/15/2016.

CLINICAL DATA: 52-year-old male with left side neck swelling.
History of prior nephrectomy and renal insufficiency/dialysis.

EXAM:
CT NECK WITHOUT CONTRAST
TECHNIQUE: Multidetector CT imaging of the neck was performed following the
standard protocol without intravenous contrast.

[Series 3: neck 2.00 br40 s3 ax soft tissue · axial · 0.56mm/px · z∈[-786,-600]mm · 4 of 125 slices shown, 5 images]
[im 16/125  soft-tissue]
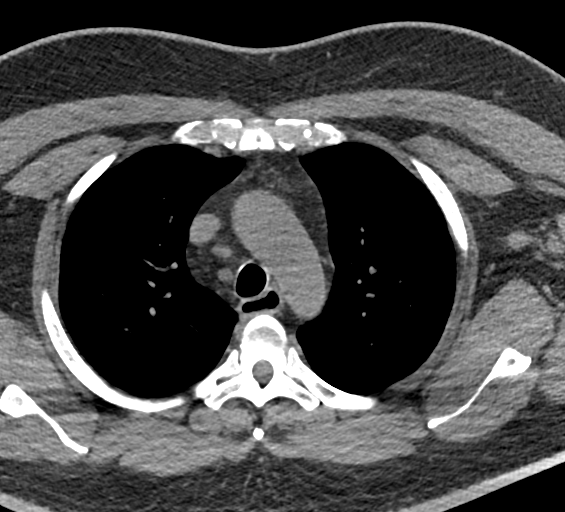
[im 16/125  bone]
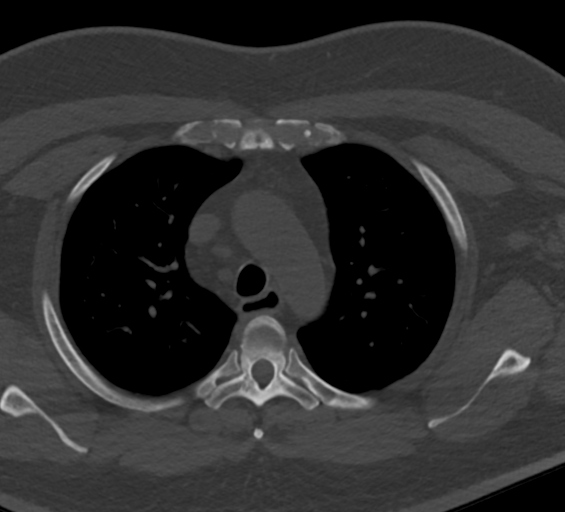
[im 47/125  bone]
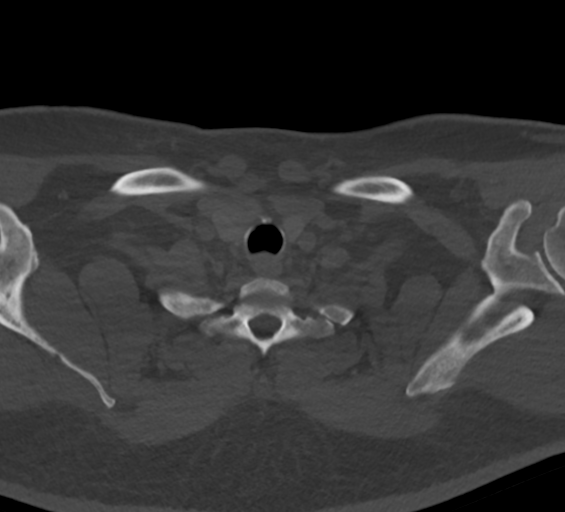
[im 78/125  bone]
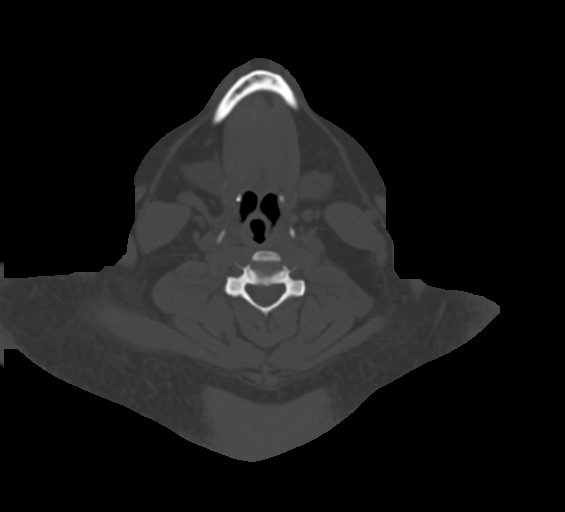
[im 109/125  bone]
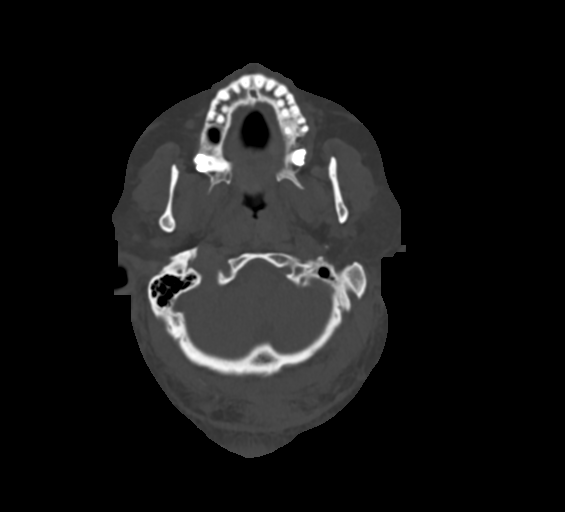

[Series 5: neck 2.00 br44 s3 sag soft tissue · sagittal · 0.49mm/px · 5 of 157 slices shown, 6 images]
[im 53/157  bone]
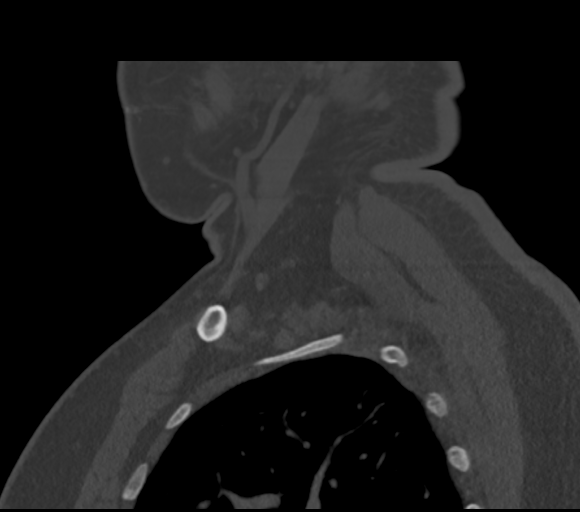
[im 66/157  bone]
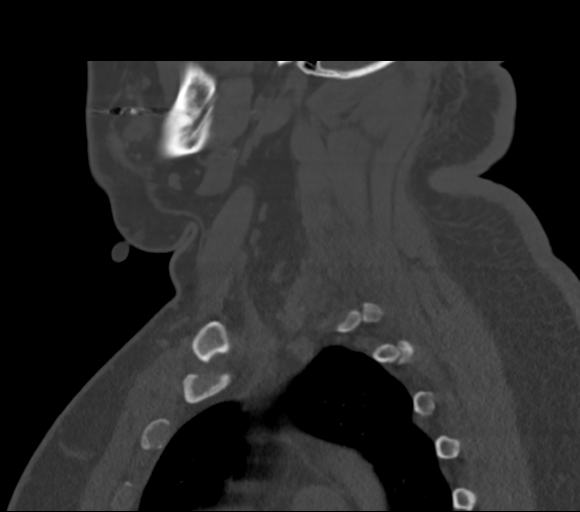
[im 79/157  soft-tissue]
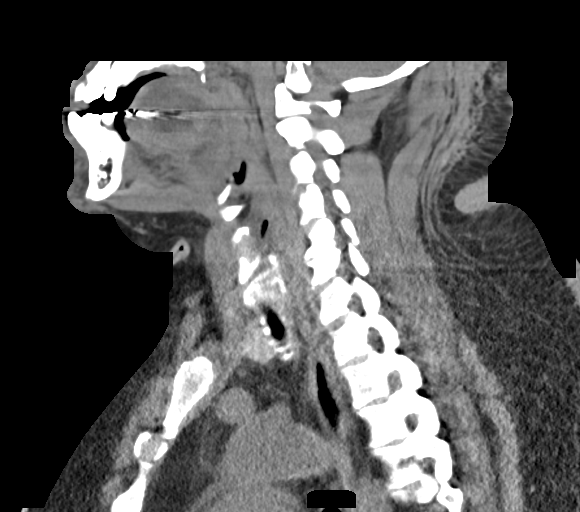
[im 79/157  bone]
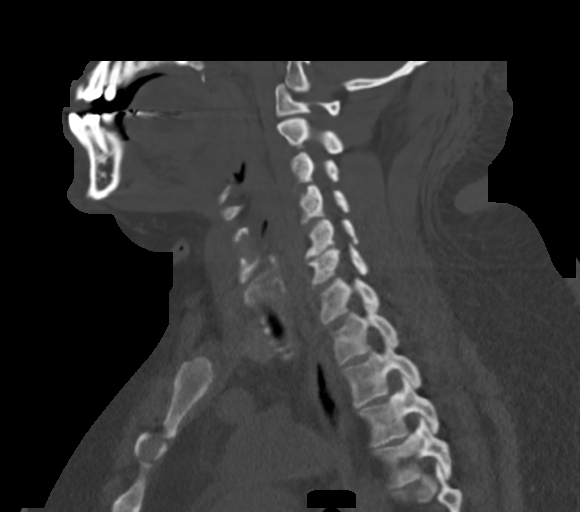
[im 92/157  bone]
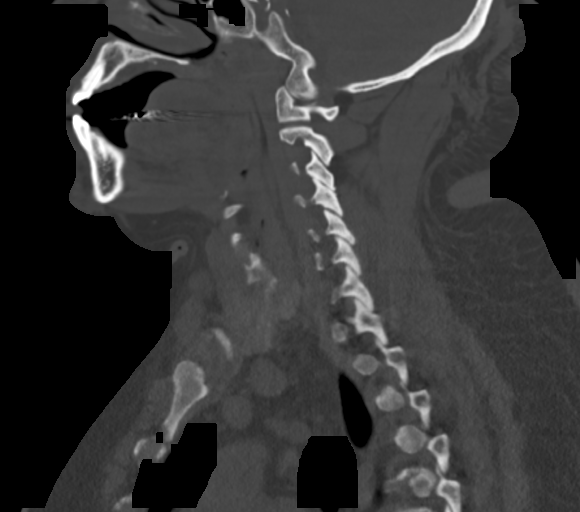
[im 105/157  bone]
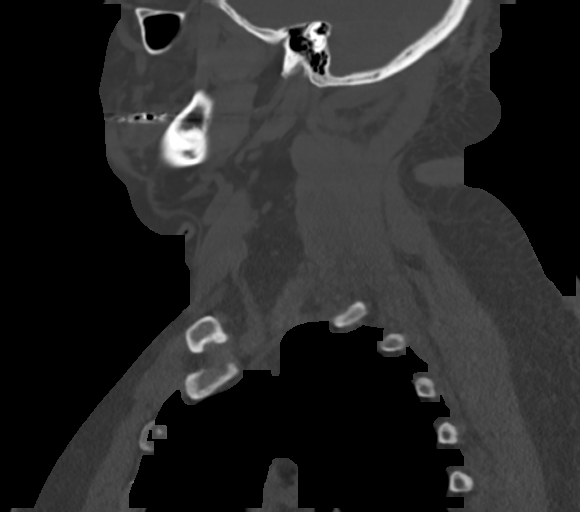

[Series 7: neck 2.00 br40 s3 cor oropharynx (person_name) · coronal · 0.37mm/px · 3 of 142 slices shown]
[im 29/142  bone]
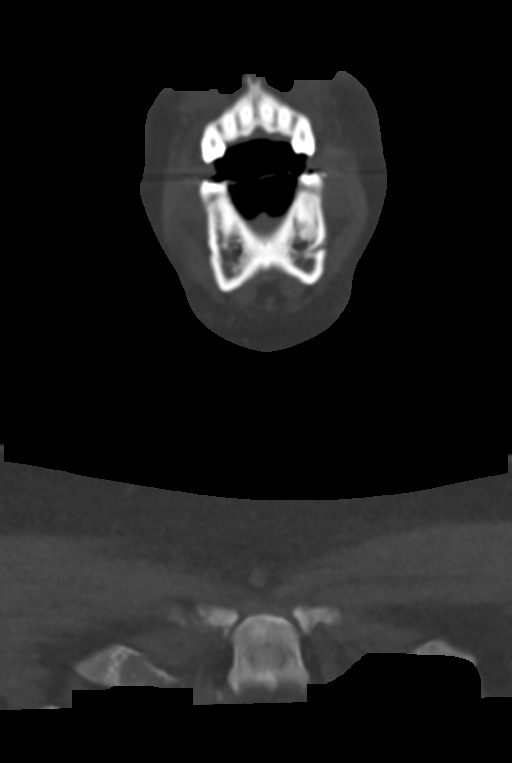
[im 57/142  bone]
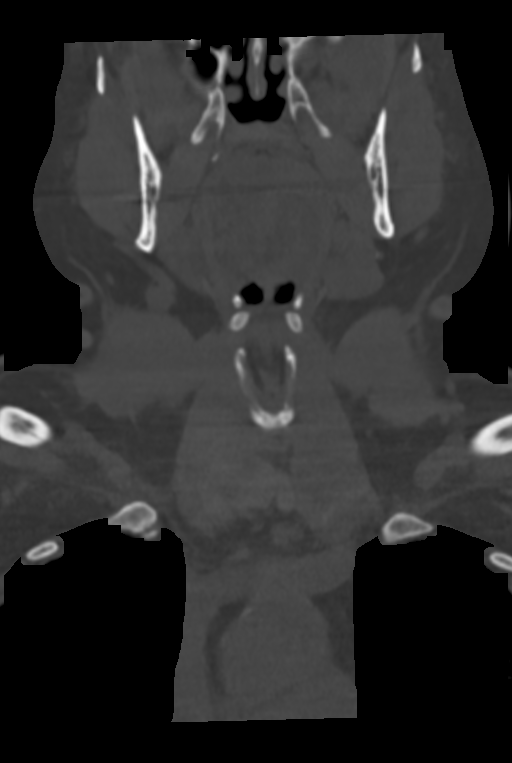
[im 85/142  bone]
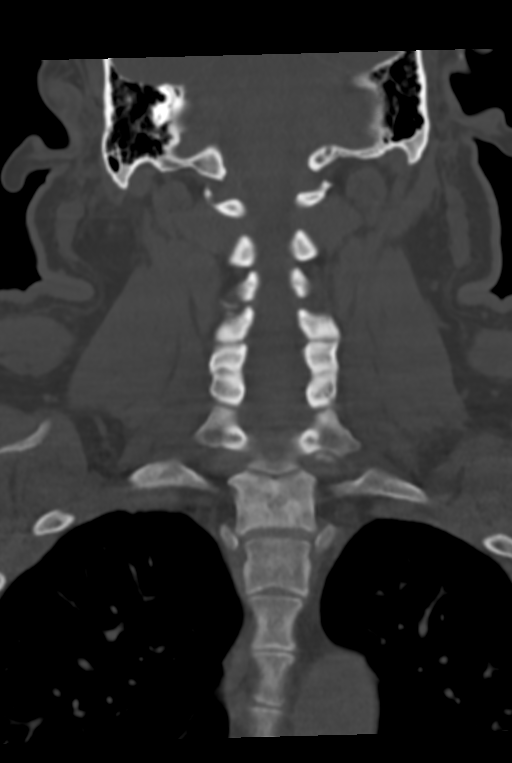

[12 of 33 positions shown; findings below may reference images not displayed]

FINDINGS: Pharynx and larynx: The glottis is closed. Otherwise the laryngeal
soft tissue contours are normal. Similarly, the oropharynx is
effaced at the level of the soft palate. The remaining pharyngeal
soft tissue contours are within normal limits. Negative
parapharyngeal spaces. Negative retropharyngeal space aside from
partially retropharyngeal course of both carotids.

Salivary glands: Negative sublingual space. Submandibular and
parotid glands appear symmetric and within normal limits.

Thyroid: Negative.

Lymph nodes: The marked area of clinical concern is identified along
the left lower chin or submandibular space as seen on series 3,
image 61. Symmetric appearing subcutaneous fat in this region.
Underlying the skin marker there is a small round 8-9 millimeters
subcutaneous nodule which appears nearly contiguous with the skin
surface (series 3, image 59). The this is nonspecific, but has a
benign appearance. There is a smaller nearby 5 millimeter
subcutaneous nodule on series 3, image 62. No regional subcutaneous
stranding or soft tissue fluid, gas.

The bilateral cervical lymph node stations, including the level 1
nodes nearest to the area of clinical concern, are normal. No
lymphadenopathy.

There is nonspecific subcutaneous and skin thickening/stranding
throughout the visible occipital and suboccipital region (series 3,
image 13). Skin thickening continues posteriorly into the upper
chest. This may be related to chronic dependent skin and
subcutaneous edema.

Vascular: Calcified atherosclerosis at both carotid bifurcations.
Vascular patency is not evaluated in the absence of IV contrast.
Right ICA siphon calcified plaque.

Limited intracranial: Negative.

Visualized orbits: Negative.

Mastoids and visualized paranasal sinuses: Clear.

Skeleton: Carious posterior left maxillary molar. Mild generalized
increased sclerosis in the spine compatible with renal
osteodystrophy. No acute osseous abnormality identified.

Upper chest: Normal visible lung parenchyma. No superior mediastinal
lymphadenopathy. The visible axillary lymph nodes are normal.
IMPRESSION: 1. Negative for neck mass or lymphadenopathy. There are two small
subcutaneous nodules located near the marked area of clinical
concern which appear benign and are probably sebaceous cysts or
similar etiology.
2. Renal osteodystrophy suspected.

## 2020-05-31 DIAGNOSIS — N186 End stage renal disease: Secondary | ICD-10-CM | POA: Diagnosis not present

## 2020-05-31 DIAGNOSIS — D689 Coagulation defect, unspecified: Secondary | ICD-10-CM | POA: Diagnosis not present

## 2020-05-31 DIAGNOSIS — D509 Iron deficiency anemia, unspecified: Secondary | ICD-10-CM | POA: Diagnosis not present

## 2020-05-31 DIAGNOSIS — D631 Anemia in chronic kidney disease: Secondary | ICD-10-CM | POA: Diagnosis not present

## 2020-05-31 DIAGNOSIS — E039 Hypothyroidism, unspecified: Secondary | ICD-10-CM | POA: Diagnosis not present

## 2020-05-31 DIAGNOSIS — N2581 Secondary hyperparathyroidism of renal origin: Secondary | ICD-10-CM | POA: Diagnosis not present

## 2020-05-31 DIAGNOSIS — Z992 Dependence on renal dialysis: Secondary | ICD-10-CM | POA: Diagnosis not present

## 2020-06-02 DIAGNOSIS — D631 Anemia in chronic kidney disease: Secondary | ICD-10-CM | POA: Diagnosis not present

## 2020-06-02 DIAGNOSIS — D689 Coagulation defect, unspecified: Secondary | ICD-10-CM | POA: Diagnosis not present

## 2020-06-02 DIAGNOSIS — E039 Hypothyroidism, unspecified: Secondary | ICD-10-CM | POA: Diagnosis not present

## 2020-06-02 DIAGNOSIS — D509 Iron deficiency anemia, unspecified: Secondary | ICD-10-CM | POA: Diagnosis not present

## 2020-06-02 DIAGNOSIS — Z992 Dependence on renal dialysis: Secondary | ICD-10-CM | POA: Diagnosis not present

## 2020-06-02 DIAGNOSIS — N2581 Secondary hyperparathyroidism of renal origin: Secondary | ICD-10-CM | POA: Diagnosis not present

## 2020-06-02 DIAGNOSIS — N186 End stage renal disease: Secondary | ICD-10-CM | POA: Diagnosis not present

## 2020-06-05 DIAGNOSIS — Z992 Dependence on renal dialysis: Secondary | ICD-10-CM | POA: Diagnosis not present

## 2020-06-05 DIAGNOSIS — N186 End stage renal disease: Secondary | ICD-10-CM | POA: Diagnosis not present

## 2020-06-05 DIAGNOSIS — N2581 Secondary hyperparathyroidism of renal origin: Secondary | ICD-10-CM | POA: Diagnosis not present

## 2020-06-05 DIAGNOSIS — D631 Anemia in chronic kidney disease: Secondary | ICD-10-CM | POA: Diagnosis not present

## 2020-06-05 DIAGNOSIS — D689 Coagulation defect, unspecified: Secondary | ICD-10-CM | POA: Diagnosis not present

## 2020-06-05 DIAGNOSIS — D509 Iron deficiency anemia, unspecified: Secondary | ICD-10-CM | POA: Diagnosis not present

## 2020-06-07 DIAGNOSIS — D631 Anemia in chronic kidney disease: Secondary | ICD-10-CM | POA: Diagnosis not present

## 2020-06-07 DIAGNOSIS — N186 End stage renal disease: Secondary | ICD-10-CM | POA: Diagnosis not present

## 2020-06-07 DIAGNOSIS — D689 Coagulation defect, unspecified: Secondary | ICD-10-CM | POA: Diagnosis not present

## 2020-06-07 DIAGNOSIS — D509 Iron deficiency anemia, unspecified: Secondary | ICD-10-CM | POA: Diagnosis not present

## 2020-06-07 DIAGNOSIS — N2581 Secondary hyperparathyroidism of renal origin: Secondary | ICD-10-CM | POA: Diagnosis not present

## 2020-06-07 DIAGNOSIS — Z992 Dependence on renal dialysis: Secondary | ICD-10-CM | POA: Diagnosis not present

## 2020-06-09 DIAGNOSIS — D509 Iron deficiency anemia, unspecified: Secondary | ICD-10-CM | POA: Diagnosis not present

## 2020-06-09 DIAGNOSIS — D631 Anemia in chronic kidney disease: Secondary | ICD-10-CM | POA: Diagnosis not present

## 2020-06-09 DIAGNOSIS — N186 End stage renal disease: Secondary | ICD-10-CM | POA: Diagnosis not present

## 2020-06-09 DIAGNOSIS — Z992 Dependence on renal dialysis: Secondary | ICD-10-CM | POA: Diagnosis not present

## 2020-06-09 DIAGNOSIS — D689 Coagulation defect, unspecified: Secondary | ICD-10-CM | POA: Diagnosis not present

## 2020-06-09 DIAGNOSIS — N2581 Secondary hyperparathyroidism of renal origin: Secondary | ICD-10-CM | POA: Diagnosis not present

## 2020-06-12 DIAGNOSIS — N186 End stage renal disease: Secondary | ICD-10-CM | POA: Diagnosis not present

## 2020-06-12 DIAGNOSIS — N2581 Secondary hyperparathyroidism of renal origin: Secondary | ICD-10-CM | POA: Diagnosis not present

## 2020-06-12 DIAGNOSIS — T7840XA Allergy, unspecified, initial encounter: Secondary | ICD-10-CM | POA: Diagnosis not present

## 2020-06-12 DIAGNOSIS — D689 Coagulation defect, unspecified: Secondary | ICD-10-CM | POA: Diagnosis not present

## 2020-06-12 DIAGNOSIS — Z992 Dependence on renal dialysis: Secondary | ICD-10-CM | POA: Diagnosis not present

## 2020-06-14 DIAGNOSIS — T7840XA Allergy, unspecified, initial encounter: Secondary | ICD-10-CM | POA: Diagnosis not present

## 2020-06-14 DIAGNOSIS — D689 Coagulation defect, unspecified: Secondary | ICD-10-CM | POA: Diagnosis not present

## 2020-06-14 DIAGNOSIS — N186 End stage renal disease: Secondary | ICD-10-CM | POA: Diagnosis not present

## 2020-06-14 DIAGNOSIS — N2581 Secondary hyperparathyroidism of renal origin: Secondary | ICD-10-CM | POA: Diagnosis not present

## 2020-06-14 DIAGNOSIS — Z992 Dependence on renal dialysis: Secondary | ICD-10-CM | POA: Diagnosis not present

## 2020-06-16 DIAGNOSIS — N2581 Secondary hyperparathyroidism of renal origin: Secondary | ICD-10-CM | POA: Diagnosis not present

## 2020-06-16 DIAGNOSIS — Z992 Dependence on renal dialysis: Secondary | ICD-10-CM | POA: Diagnosis not present

## 2020-06-16 DIAGNOSIS — T7840XA Allergy, unspecified, initial encounter: Secondary | ICD-10-CM | POA: Diagnosis not present

## 2020-06-16 DIAGNOSIS — N186 End stage renal disease: Secondary | ICD-10-CM | POA: Diagnosis not present

## 2020-06-16 DIAGNOSIS — D689 Coagulation defect, unspecified: Secondary | ICD-10-CM | POA: Diagnosis not present

## 2020-06-19 DIAGNOSIS — N2581 Secondary hyperparathyroidism of renal origin: Secondary | ICD-10-CM | POA: Diagnosis not present

## 2020-06-19 DIAGNOSIS — R52 Pain, unspecified: Secondary | ICD-10-CM | POA: Diagnosis not present

## 2020-06-19 DIAGNOSIS — N186 End stage renal disease: Secondary | ICD-10-CM | POA: Diagnosis not present

## 2020-06-19 DIAGNOSIS — D689 Coagulation defect, unspecified: Secondary | ICD-10-CM | POA: Diagnosis not present

## 2020-06-19 DIAGNOSIS — Z992 Dependence on renal dialysis: Secondary | ICD-10-CM | POA: Diagnosis not present

## 2020-06-21 DIAGNOSIS — D689 Coagulation defect, unspecified: Secondary | ICD-10-CM | POA: Diagnosis not present

## 2020-06-21 DIAGNOSIS — N2581 Secondary hyperparathyroidism of renal origin: Secondary | ICD-10-CM | POA: Diagnosis not present

## 2020-06-21 DIAGNOSIS — N186 End stage renal disease: Secondary | ICD-10-CM | POA: Diagnosis not present

## 2020-06-21 DIAGNOSIS — Z992 Dependence on renal dialysis: Secondary | ICD-10-CM | POA: Diagnosis not present

## 2020-06-21 DIAGNOSIS — R52 Pain, unspecified: Secondary | ICD-10-CM | POA: Diagnosis not present

## 2020-06-23 DIAGNOSIS — N2581 Secondary hyperparathyroidism of renal origin: Secondary | ICD-10-CM | POA: Diagnosis not present

## 2020-06-23 DIAGNOSIS — Z992 Dependence on renal dialysis: Secondary | ICD-10-CM | POA: Diagnosis not present

## 2020-06-23 DIAGNOSIS — N186 End stage renal disease: Secondary | ICD-10-CM | POA: Diagnosis not present

## 2020-06-23 DIAGNOSIS — D689 Coagulation defect, unspecified: Secondary | ICD-10-CM | POA: Diagnosis not present

## 2020-06-23 DIAGNOSIS — R52 Pain, unspecified: Secondary | ICD-10-CM | POA: Diagnosis not present

## 2020-06-26 DIAGNOSIS — D689 Coagulation defect, unspecified: Secondary | ICD-10-CM | POA: Diagnosis not present

## 2020-06-26 DIAGNOSIS — Z992 Dependence on renal dialysis: Secondary | ICD-10-CM | POA: Diagnosis not present

## 2020-06-26 DIAGNOSIS — N2581 Secondary hyperparathyroidism of renal origin: Secondary | ICD-10-CM | POA: Diagnosis not present

## 2020-06-26 DIAGNOSIS — N186 End stage renal disease: Secondary | ICD-10-CM | POA: Diagnosis not present

## 2020-06-27 ENCOUNTER — Ambulatory Visit: Payer: Medicare Other | Admitting: Vascular Surgery

## 2020-06-27 ENCOUNTER — Other Ambulatory Visit: Payer: Self-pay

## 2020-06-27 ENCOUNTER — Encounter: Payer: Self-pay | Admitting: Vascular Surgery

## 2020-06-27 VITALS — BP 163/85 | HR 76 | Temp 98.0°F | Resp 18 | Ht 68.0 in | Wt 251.0 lb

## 2020-06-27 DIAGNOSIS — Z992 Dependence on renal dialysis: Secondary | ICD-10-CM | POA: Diagnosis not present

## 2020-06-27 DIAGNOSIS — N186 End stage renal disease: Secondary | ICD-10-CM | POA: Diagnosis not present

## 2020-06-27 NOTE — Progress Notes (Signed)
Patient name: Tim Becker MRN: [id number] DOB: 02-22-1966 Sex: male  REASON FOR CONSULT: Evaluate left arm AV fistula aneurysms  HPI: Tim Becker is a 55 y.o. male, with end-stage renal disease on, HTN, DM, CAD that presents for evaluation of left arm AV fistula aneurysms.  Patient has a left brachiobasilic fistula that was done in 2016 at [facility].  Had a failed radiocephalic AVF prior to this.  He has had aneurysms for several years over the left arm basilic vein fistula.  He states the fistula is working well at dialysis.  Has had no bleeding events.  No active scabs or ulcerations.  States multiple previous left arm fistulograms at [facility] and fistula has been open with no issues.  Past Medical History:  Diagnosis Date  . Anemia   . Arthritis   . Cancer [facility])    Renal Tumor  . CKD (chronic kidney disease)   . Coronary artery disease   . Diabetes ([place])   . DM (diabetes mellitus) with complications ([place])   . ESRD (end stage renal disease) ([place])    Pre- dialysis  . Hypertension   . Left kidney mass   . Neuropathy   . Obesity   . Osteomyelitis, chronic, ankle or foot ([place])   . PONV (postoperative nausea and vomiting)   . Renal insufficiency    Patient is on Dialysis and receives M,W and F.  . S/P BKA (below knee amputation) [facility]) June 2016   Right , [place], [place]  . Sleep apnea    CPAP  . Thyroid disease     Past Surgical History:  Procedure Laterality Date  . BASCILIC VEIN TRANSPOSITION Left 02/17/2015   Procedure: BASCILIC VEIN TRANSPOSITION;  Surgeon: Katha Cabal, MD;  Location: [facility] ORS;  Service: Vascular;  Laterality: Left;  . BELOW KNEE LEG AMPUTATION Right   . CORONARY ANGIOPLASTY WITH STENT PLACEMENT    . EYE SURGERY    . FOOT SURGERY     Multiple R foot surgery for infection and charcot foot  . I & D EXTREMITY Left 04/16/2016   Procedure: IRRIGATION AND DEBRIDEMENT EXTREMITY/GREAT TOE AMP.;  Surgeon: Edrick Kins, DPM;  Location: [place];   Service: Podiatry;  Laterality: Left;  . JOINT REPLACEMENT    . LAPAROSCOPIC NEPHRECTOMY, HAND ASSISTED Left 07/11/2015   Procedure: HAND ASSISTED LAPAROSCOPIC NEPHRECTOMY;  Surgeon: Hollice Espy, MD;  Location: [facility] ORS;  Service: Urology;  Laterality: Left;  . PERIPHERAL VASCULAR CATHETERIZATION Left 12/06/2014   Procedure: A/V Shuntogram/Fistulagram;  Surgeon: Katha Cabal, MD;  Location: [place] [facility];  Service: Cardiovascular;  Laterality: Left;  . PERIPHERAL VASCULAR CATHETERIZATION Left 12/06/2014   Procedure: A/V Shunt Intervention;  Surgeon: Katha Cabal, MD;  Location: [place] [facility];  Service: Cardiovascular;  Laterality: Left;  . TONSILLECTOMY      Family History  Problem Relation Age of Onset  . Diabetes Mother   . Kidney disease Mother   . Breast cancer Mother     SOCIAL HISTORY: Social History   Socioeconomic History  . Marital status: Married    Spouse name: Not on file  . Number of children: Not on file  . Years of education: Not on file  . Highest education level: Not on file  Occupational History  . Not on file  Tobacco Use  . Smoking status: Never Smoker  . Smokeless tobacco: Never Used  Vaping Use  . Vaping Use: Never used  Substance and Sexual Activity  .  Alcohol use: No  . Drug use: No  . Sexual activity: Yes  Other Topics Concern  . Not on file  Social History Narrative  . Not on file   Social Determinants of Health   Financial Resource Strain: Not on file  Food Insecurity: Not on file  Transportation Needs: Not on file  Physical Activity: Not on file  Stress: Not on file  Social Connections: Not on file  Intimate Partner Violence: Not on file    Allergies  Allergen Reactions  . Ferric Citrate Nausea And Vomiting  . No Known Allergies     Current Outpatient Medications  Medication Sig Dispense Refill  . Accu-Chek Softclix Lancets lancets Accu-Chek Softclix Lancets    . acetaminophen (TYLENOL) 500 MG tablet Take  1,000 mg by mouth every 6 (six) hours as needed for headache (pain).    . AMBULATORY NON FORMULARY MEDICATION Nitroglycerine ointment 0.125 %  Apply a pea sized amount internally four times daily. Dispense 30 GM (Patient taking differently: Nitroglycerine ointment 0.125 %  Apply a pea sized amount internally four times daily as needed. Dispense 30 GM) 30 g 1  . amLODipine (NORVASC) 10 MG tablet     . atorvastatin (LIPITOR) 40 MG tablet Take 1 tablet (40 mg total) by mouth daily. 90 tablet 3  . calcium acetate (PHOSLO) 667 MG capsule Take by mouth.    . carvedilol (COREG) 25 MG tablet Take 25 mg by mouth 2 (two) times daily with a meal.     . gentamicin cream (GARAMYCIN) 0.1 % Apply 1 application topically 2 (two) times daily. 30 g 1  . Glucose Blood (ONETOUCH TEST VI) by In Vitro route.    . insulin glargine (LANTUS) 100 unit/mL SOPN Inject 80 Units into the skin daily before breakfast.    . insulin lispro (HUMALOG KWIKPEN) 200 UNIT/ML KwikPen Inject 20 Units into the skin with breakfast, with lunch, and with evening meal. 30 mL 6  . PRESCRIPTION MEDICATION Inhale into the lungs at bedtime. CPAP    . tiZANidine (ZANAFLEX) 2 MG tablet Take 1 tablet (2 mg total) by mouth 2 (two) times daily as needed for muscle spasms. 10 tablet 0  . oxyCODONE-acetaminophen (PERCOCET) 5-325 MG tablet Take 1 tablet by mouth every 8 (eight) hours as needed for severe pain. (Patient not taking: Reported on 06/27/2020) 30 tablet 0   No current facility-administered medications for this visit.    REVIEW OF SYSTEMS:  [X]  denotes positive finding, [ ]  denotes negative finding Cardiac  Comments:  Chest pain or chest pressure:    Shortness of breath upon exertion:    Short of breath when lying flat:    Irregular heart rhythm:        Vascular    Pain in calf, thigh, or hip brought on by ambulation:    Pain in feet at night that wakes you up from your sleep:     Blood clot in your veins:    Leg swelling:          Pulmonary    Oxygen at home:    Productive cough:     Wheezing:         Neurologic    Sudden weakness in arms or legs:     Sudden numbness in arms or legs:     Sudden onset of difficulty speaking or slurred speech:    Temporary loss of vision in one eye:     Problems with dizziness:  Gastrointestinal    Blood in stool:     Vomited blood:         Genitourinary    Burning when urinating:     Blood in urine:        Psychiatric    Major depression:         Hematologic    Bleeding problems:    Problems with blood clotting too easily:        Skin    Rashes or ulcers:        Constitutional    Fever or chills:      PHYSICAL EXAM: Vitals:   06/27/20 0843  BP: (!) 163/85  Pulse: 76  Resp: 18  Temp: 98 F (36.7 C)  TempSrc: Temporal  SpO2: 97%  Weight: 251 lb (113.9 kg)  Height: 5\' 8"  (1.727 m)    GENERAL: The patient is a well-nourished male, in no acute distress. The vital signs are documented above. CARDIAC: There is a regular rate and rhythm.  VASCULAR:  Left brachiobasilic AV fistula with good thrill 2 tandem aneurysms as pictured below PULMONARY: No respiratory distress. ABDOMEN: Soft and non-tender.  MUSCULOSKELETAL: There are no major deformities or cyanosis. NEUROLOGIC: No focal weakness or paresthesias are detected. SKIN: Hypopigmentation over distal left upper arm aneurysm, but skin intact PSYCHIATRIC: The patient has a normal affect.      DATA:   None  Assessment/Plan:  55 year old male with end-stage renal disease that presents for evaluation of left brachiobasilic fistula with two tandem aneurysms.  As pictured above there is no active ulcerations or scabs.  He does have an area of hyperpigmentation on the distal aneurysm but the skin is intact here.  Has had no bleeding events.  States the fistula is working fine.  Good thrill on exam.  Discussed that I would recommend continued observation unless he develops thinning skin, ulceration  or bleeding event, or the the fistula stops functioning.  He will follow up with [place] as needed.   Marty Heck, MD [facility] Office: [phone]

## 2020-06-28 DIAGNOSIS — D689 Coagulation defect, unspecified: Secondary | ICD-10-CM | POA: Diagnosis not present

## 2020-06-28 DIAGNOSIS — Z992 Dependence on renal dialysis: Secondary | ICD-10-CM | POA: Diagnosis not present

## 2020-06-28 DIAGNOSIS — N186 End stage renal disease: Secondary | ICD-10-CM | POA: Diagnosis not present

## 2020-06-28 DIAGNOSIS — N2581 Secondary hyperparathyroidism of renal origin: Secondary | ICD-10-CM | POA: Diagnosis not present

## 2020-06-29 DIAGNOSIS — N186 End stage renal disease: Secondary | ICD-10-CM | POA: Diagnosis not present

## 2020-06-29 DIAGNOSIS — E1122 Type 2 diabetes mellitus with diabetic chronic kidney disease: Secondary | ICD-10-CM | POA: Diagnosis not present

## 2020-06-29 DIAGNOSIS — Z992 Dependence on renal dialysis: Secondary | ICD-10-CM | POA: Diagnosis not present

## 2020-06-30 DIAGNOSIS — Z992 Dependence on renal dialysis: Secondary | ICD-10-CM | POA: Diagnosis not present

## 2020-06-30 DIAGNOSIS — R52 Pain, unspecified: Secondary | ICD-10-CM | POA: Diagnosis not present

## 2020-06-30 DIAGNOSIS — N2581 Secondary hyperparathyroidism of renal origin: Secondary | ICD-10-CM | POA: Diagnosis not present

## 2020-06-30 DIAGNOSIS — D689 Coagulation defect, unspecified: Secondary | ICD-10-CM | POA: Diagnosis not present

## 2020-06-30 DIAGNOSIS — N186 End stage renal disease: Secondary | ICD-10-CM | POA: Diagnosis not present

## 2020-07-03 DIAGNOSIS — Z992 Dependence on renal dialysis: Secondary | ICD-10-CM | POA: Diagnosis not present

## 2020-07-03 DIAGNOSIS — E1122 Type 2 diabetes mellitus with diabetic chronic kidney disease: Secondary | ICD-10-CM | POA: Diagnosis not present

## 2020-07-03 DIAGNOSIS — D689 Coagulation defect, unspecified: Secondary | ICD-10-CM | POA: Diagnosis not present

## 2020-07-03 DIAGNOSIS — D631 Anemia in chronic kidney disease: Secondary | ICD-10-CM | POA: Diagnosis not present

## 2020-07-03 DIAGNOSIS — N186 End stage renal disease: Secondary | ICD-10-CM | POA: Diagnosis not present

## 2020-07-03 DIAGNOSIS — E039 Hypothyroidism, unspecified: Secondary | ICD-10-CM | POA: Diagnosis not present

## 2020-07-03 DIAGNOSIS — N2581 Secondary hyperparathyroidism of renal origin: Secondary | ICD-10-CM | POA: Diagnosis not present

## 2020-07-04 DIAGNOSIS — R222 Localized swelling, mass and lump, trunk: Principal | ICD-10-CM

## 2020-07-04 DIAGNOSIS — Z7682 Awaiting organ transplant status: Principal | ICD-10-CM

## 2020-07-05 DIAGNOSIS — Z992 Dependence on renal dialysis: Secondary | ICD-10-CM | POA: Diagnosis not present

## 2020-07-05 DIAGNOSIS — N186 End stage renal disease: Secondary | ICD-10-CM | POA: Diagnosis not present

## 2020-07-05 DIAGNOSIS — E1122 Type 2 diabetes mellitus with diabetic chronic kidney disease: Secondary | ICD-10-CM | POA: Diagnosis not present

## 2020-07-05 DIAGNOSIS — D631 Anemia in chronic kidney disease: Secondary | ICD-10-CM | POA: Diagnosis not present

## 2020-07-05 DIAGNOSIS — D689 Coagulation defect, unspecified: Secondary | ICD-10-CM | POA: Diagnosis not present

## 2020-07-05 DIAGNOSIS — E039 Hypothyroidism, unspecified: Secondary | ICD-10-CM | POA: Diagnosis not present

## 2020-07-05 DIAGNOSIS — N2581 Secondary hyperparathyroidism of renal origin: Secondary | ICD-10-CM | POA: Diagnosis not present

## 2020-07-07 DIAGNOSIS — E1122 Type 2 diabetes mellitus with diabetic chronic kidney disease: Secondary | ICD-10-CM | POA: Diagnosis not present

## 2020-07-07 DIAGNOSIS — N2581 Secondary hyperparathyroidism of renal origin: Secondary | ICD-10-CM | POA: Diagnosis not present

## 2020-07-07 DIAGNOSIS — D631 Anemia in chronic kidney disease: Secondary | ICD-10-CM | POA: Diagnosis not present

## 2020-07-07 DIAGNOSIS — E039 Hypothyroidism, unspecified: Secondary | ICD-10-CM | POA: Diagnosis not present

## 2020-07-07 DIAGNOSIS — N186 End stage renal disease: Secondary | ICD-10-CM | POA: Diagnosis not present

## 2020-07-07 DIAGNOSIS — D689 Coagulation defect, unspecified: Secondary | ICD-10-CM | POA: Diagnosis not present

## 2020-07-07 DIAGNOSIS — Z992 Dependence on renal dialysis: Secondary | ICD-10-CM | POA: Diagnosis not present

## 2020-07-10 DIAGNOSIS — N186 End stage renal disease: Secondary | ICD-10-CM | POA: Diagnosis not present

## 2020-07-10 DIAGNOSIS — Z992 Dependence on renal dialysis: Secondary | ICD-10-CM | POA: Diagnosis not present

## 2020-07-10 DIAGNOSIS — N2581 Secondary hyperparathyroidism of renal origin: Secondary | ICD-10-CM | POA: Diagnosis not present

## 2020-07-10 DIAGNOSIS — R52 Pain, unspecified: Secondary | ICD-10-CM | POA: Diagnosis not present

## 2020-07-10 DIAGNOSIS — D689 Coagulation defect, unspecified: Secondary | ICD-10-CM | POA: Diagnosis not present

## 2020-07-12 DIAGNOSIS — N2581 Secondary hyperparathyroidism of renal origin: Secondary | ICD-10-CM | POA: Diagnosis not present

## 2020-07-12 DIAGNOSIS — Z992 Dependence on renal dialysis: Secondary | ICD-10-CM | POA: Diagnosis not present

## 2020-07-12 DIAGNOSIS — N186 End stage renal disease: Secondary | ICD-10-CM | POA: Diagnosis not present

## 2020-07-12 DIAGNOSIS — D689 Coagulation defect, unspecified: Secondary | ICD-10-CM | POA: Diagnosis not present

## 2020-07-12 DIAGNOSIS — R52 Pain, unspecified: Secondary | ICD-10-CM | POA: Diagnosis not present

## 2020-07-14 DIAGNOSIS — D689 Coagulation defect, unspecified: Secondary | ICD-10-CM | POA: Diagnosis not present

## 2020-07-14 DIAGNOSIS — Z992 Dependence on renal dialysis: Secondary | ICD-10-CM | POA: Diagnosis not present

## 2020-07-14 DIAGNOSIS — N186 End stage renal disease: Secondary | ICD-10-CM | POA: Diagnosis not present

## 2020-07-14 DIAGNOSIS — R52 Pain, unspecified: Secondary | ICD-10-CM | POA: Diagnosis not present

## 2020-07-14 DIAGNOSIS — N2581 Secondary hyperparathyroidism of renal origin: Secondary | ICD-10-CM | POA: Diagnosis not present

## 2020-07-17 DIAGNOSIS — Z992 Dependence on renal dialysis: Secondary | ICD-10-CM | POA: Diagnosis not present

## 2020-07-17 DIAGNOSIS — D689 Coagulation defect, unspecified: Secondary | ICD-10-CM | POA: Diagnosis not present

## 2020-07-17 DIAGNOSIS — N2581 Secondary hyperparathyroidism of renal origin: Secondary | ICD-10-CM | POA: Diagnosis not present

## 2020-07-17 DIAGNOSIS — N186 End stage renal disease: Secondary | ICD-10-CM | POA: Diagnosis not present

## 2020-07-19 DIAGNOSIS — N2581 Secondary hyperparathyroidism of renal origin: Secondary | ICD-10-CM | POA: Diagnosis not present

## 2020-07-19 DIAGNOSIS — N186 End stage renal disease: Secondary | ICD-10-CM | POA: Diagnosis not present

## 2020-07-19 DIAGNOSIS — Z992 Dependence on renal dialysis: Secondary | ICD-10-CM | POA: Diagnosis not present

## 2020-07-19 DIAGNOSIS — D689 Coagulation defect, unspecified: Secondary | ICD-10-CM | POA: Diagnosis not present

## 2020-07-21 DIAGNOSIS — Z992 Dependence on renal dialysis: Secondary | ICD-10-CM | POA: Diagnosis not present

## 2020-07-21 DIAGNOSIS — N2581 Secondary hyperparathyroidism of renal origin: Secondary | ICD-10-CM | POA: Diagnosis not present

## 2020-07-21 DIAGNOSIS — N186 End stage renal disease: Secondary | ICD-10-CM | POA: Diagnosis not present

## 2020-07-21 DIAGNOSIS — D689 Coagulation defect, unspecified: Secondary | ICD-10-CM | POA: Diagnosis not present

## 2020-07-24 DIAGNOSIS — D689 Coagulation defect, unspecified: Secondary | ICD-10-CM | POA: Diagnosis not present

## 2020-07-24 DIAGNOSIS — N186 End stage renal disease: Secondary | ICD-10-CM | POA: Diagnosis not present

## 2020-07-24 DIAGNOSIS — N2581 Secondary hyperparathyroidism of renal origin: Secondary | ICD-10-CM | POA: Diagnosis not present

## 2020-07-24 DIAGNOSIS — Z992 Dependence on renal dialysis: Secondary | ICD-10-CM | POA: Diagnosis not present

## 2020-07-26 DIAGNOSIS — N186 End stage renal disease: Secondary | ICD-10-CM | POA: Diagnosis not present

## 2020-07-26 DIAGNOSIS — N2581 Secondary hyperparathyroidism of renal origin: Secondary | ICD-10-CM | POA: Diagnosis not present

## 2020-07-26 DIAGNOSIS — Z992 Dependence on renal dialysis: Secondary | ICD-10-CM | POA: Diagnosis not present

## 2020-07-26 DIAGNOSIS — D689 Coagulation defect, unspecified: Secondary | ICD-10-CM | POA: Diagnosis not present

## 2020-07-29 DIAGNOSIS — Z992 Dependence on renal dialysis: Secondary | ICD-10-CM | POA: Diagnosis not present

## 2020-07-29 DIAGNOSIS — E1122 Type 2 diabetes mellitus with diabetic chronic kidney disease: Secondary | ICD-10-CM | POA: Diagnosis not present

## 2020-07-29 DIAGNOSIS — N186 End stage renal disease: Secondary | ICD-10-CM | POA: Diagnosis not present

## 2020-07-31 DIAGNOSIS — E039 Hypothyroidism, unspecified: Secondary | ICD-10-CM | POA: Diagnosis not present

## 2020-07-31 DIAGNOSIS — D631 Anemia in chronic kidney disease: Secondary | ICD-10-CM | POA: Diagnosis not present

## 2020-07-31 DIAGNOSIS — Z992 Dependence on renal dialysis: Secondary | ICD-10-CM | POA: Diagnosis not present

## 2020-07-31 DIAGNOSIS — N186 End stage renal disease: Secondary | ICD-10-CM | POA: Diagnosis not present

## 2020-07-31 DIAGNOSIS — N2581 Secondary hyperparathyroidism of renal origin: Secondary | ICD-10-CM | POA: Diagnosis not present

## 2020-07-31 DIAGNOSIS — D689 Coagulation defect, unspecified: Secondary | ICD-10-CM | POA: Diagnosis not present

## 2020-08-02 DIAGNOSIS — E039 Hypothyroidism, unspecified: Secondary | ICD-10-CM | POA: Diagnosis not present

## 2020-08-02 DIAGNOSIS — N2581 Secondary hyperparathyroidism of renal origin: Secondary | ICD-10-CM | POA: Diagnosis not present

## 2020-08-02 DIAGNOSIS — Z992 Dependence on renal dialysis: Secondary | ICD-10-CM | POA: Diagnosis not present

## 2020-08-02 DIAGNOSIS — N186 End stage renal disease: Secondary | ICD-10-CM | POA: Diagnosis not present

## 2020-08-02 DIAGNOSIS — D689 Coagulation defect, unspecified: Secondary | ICD-10-CM | POA: Diagnosis not present

## 2020-08-02 DIAGNOSIS — D631 Anemia in chronic kidney disease: Secondary | ICD-10-CM | POA: Diagnosis not present

## 2020-08-04 DIAGNOSIS — D631 Anemia in chronic kidney disease: Secondary | ICD-10-CM | POA: Diagnosis not present

## 2020-08-04 DIAGNOSIS — E039 Hypothyroidism, unspecified: Secondary | ICD-10-CM | POA: Diagnosis not present

## 2020-08-04 DIAGNOSIS — N186 End stage renal disease: Secondary | ICD-10-CM | POA: Diagnosis not present

## 2020-08-04 DIAGNOSIS — D689 Coagulation defect, unspecified: Secondary | ICD-10-CM | POA: Diagnosis not present

## 2020-08-04 DIAGNOSIS — Z992 Dependence on renal dialysis: Secondary | ICD-10-CM | POA: Diagnosis not present

## 2020-08-04 DIAGNOSIS — N2581 Secondary hyperparathyroidism of renal origin: Secondary | ICD-10-CM | POA: Diagnosis not present

## 2020-08-07 DIAGNOSIS — N2581 Secondary hyperparathyroidism of renal origin: Secondary | ICD-10-CM | POA: Diagnosis not present

## 2020-08-07 DIAGNOSIS — D689 Coagulation defect, unspecified: Secondary | ICD-10-CM | POA: Diagnosis not present

## 2020-08-07 DIAGNOSIS — Z992 Dependence on renal dialysis: Secondary | ICD-10-CM | POA: Diagnosis not present

## 2020-08-07 DIAGNOSIS — R52 Pain, unspecified: Secondary | ICD-10-CM | POA: Diagnosis not present

## 2020-08-07 DIAGNOSIS — N186 End stage renal disease: Secondary | ICD-10-CM | POA: Diagnosis not present

## 2020-08-08 DIAGNOSIS — E1143 Type 2 diabetes mellitus with diabetic autonomic (poly)neuropathy: Secondary | ICD-10-CM | POA: Diagnosis not present

## 2020-08-08 DIAGNOSIS — I10 Essential (primary) hypertension: Secondary | ICD-10-CM | POA: Diagnosis not present

## 2020-08-08 DIAGNOSIS — E039 Hypothyroidism, unspecified: Secondary | ICD-10-CM | POA: Diagnosis not present

## 2020-08-08 DIAGNOSIS — E1122 Type 2 diabetes mellitus with diabetic chronic kidney disease: Secondary | ICD-10-CM | POA: Diagnosis not present

## 2020-08-08 DIAGNOSIS — E782 Mixed hyperlipidemia: Secondary | ICD-10-CM | POA: Diagnosis not present

## 2020-08-09 DIAGNOSIS — N186 End stage renal disease: Secondary | ICD-10-CM | POA: Diagnosis not present

## 2020-08-09 DIAGNOSIS — R52 Pain, unspecified: Secondary | ICD-10-CM | POA: Diagnosis not present

## 2020-08-09 DIAGNOSIS — Z992 Dependence on renal dialysis: Secondary | ICD-10-CM | POA: Diagnosis not present

## 2020-08-09 DIAGNOSIS — N2581 Secondary hyperparathyroidism of renal origin: Secondary | ICD-10-CM | POA: Diagnosis not present

## 2020-08-09 DIAGNOSIS — D689 Coagulation defect, unspecified: Secondary | ICD-10-CM | POA: Diagnosis not present

## 2020-08-14 DIAGNOSIS — D689 Coagulation defect, unspecified: Secondary | ICD-10-CM | POA: Diagnosis not present

## 2020-08-14 DIAGNOSIS — Z992 Dependence on renal dialysis: Secondary | ICD-10-CM | POA: Diagnosis not present

## 2020-08-14 DIAGNOSIS — N186 End stage renal disease: Secondary | ICD-10-CM | POA: Diagnosis not present

## 2020-08-14 DIAGNOSIS — N2581 Secondary hyperparathyroidism of renal origin: Secondary | ICD-10-CM | POA: Diagnosis not present

## 2020-08-15 ENCOUNTER — Ambulatory Visit (INDEPENDENT_AMBULATORY_CARE_PROVIDER_SITE_OTHER): Payer: Medicare Other | Admitting: Physician Assistant

## 2020-08-15 ENCOUNTER — Encounter: Payer: Self-pay | Admitting: Physician Assistant

## 2020-08-15 ENCOUNTER — Other Ambulatory Visit: Payer: Self-pay

## 2020-08-15 DIAGNOSIS — Z89511 Acquired absence of right leg below knee: Secondary | ICD-10-CM | POA: Diagnosis not present

## 2020-08-15 DIAGNOSIS — T8789 Other complications of amputation stump: Secondary | ICD-10-CM | POA: Diagnosis not present

## 2020-08-15 DIAGNOSIS — L97919 Non-pressure chronic ulcer of unspecified part of right lower leg with unspecified severity: Secondary | ICD-10-CM | POA: Diagnosis not present

## 2020-08-15 MED ORDER — DOXYCYCLINE HYCLATE 100 MG PO TABS: 100.0000 mg | ORAL_TABLET | Freq: Two times a day (BID) | ORAL | 0 refills | Status: DC

## 2020-08-15 NOTE — Progress Notes (Signed)
Office Visit Note   Patient: Tim Becker           Date of Birth: 1965-06-05           MRN: [id number] Visit Date: 08/15/2020              Requested by: Leeroy Cha, MD [address] [place],  [place] [address] PCP: Leeroy Cha, MD  Chief Complaint  Patient presents with  . Right Leg - Wound Check      HPI: Patient is a very pleasant 55 year old gentleman who comes in for evaluation of his right transtibial amputation.  He had his amputation performed elsewhere a couple years ago.  He had a continued callus on the anterior aspect of his tibia.  Despite adjustments with his socket this could not be resolved.  He met with Dr. Sharol Given over a year ago who felt that because of failure of resolution with socket modification that he currently has a recurrent ulcer over the residual limb it was recommended to proceed with revision of the transtibial amputation.  He thought his best option would be revision of the amputation for excision of the ulcerative tissue resection approximately a centimeter of the tibia and fibula and rotation of the posterior muscle flap over the end of the bone.  Patient did not go forward with the surgery.  His biggest concern is that he would have to be off his stump for 4 weeks and his wife works nights and he has 13 stairs.  He also would not have any transportation to dialysis as he drives himself.  He would like to go forward with the surgery with discharge to rehab for 4 weeks afterwards.  He is also concerned today because the area has opened up more and has a foul odor.  He also said he had a slight fever  Assessment & Plan: Visit Diagnoses: No diagnosis found.  Plan: We will call in just a weeks of doxycycline and a probiotic.  I will also given him a prescription to get to shrinkers size extra-large.  I do not want him using any more Betadine or Neosporin over the area.  Follow-up for recheck in 3 weeks  Follow-Up Instructions:  No follow-ups on file.   Ortho Exam  Patient is alert, oriented, no adenopathy, well-dressed, normal affect, normal respiratory effort. He has a thickened callus over the anterior aspect of the tibia at the end of the amputation stump.  He does have some breakdown of the callus with some exposed deep layer though I cannot see any exposed bone.  There is a mild foul odor.  No surrounding cellulitis no fluctuance  Imaging: No results found. No images are attached to the encounter.  Labs: Lab Results  Component Value Date   HGBA1C 8.6 (H) 04/16/2016   ESRSEDRATE 74 (H) 04/15/2016   ESRSEDRATE 127 (H) 08/10/2014   CRP 13.3 (H) 04/15/2016   CRP 8.9 (H) 08/10/2014   REPTSTATUS 04/24/2016 FINAL 04/16/2016   GRAMSTAIN  04/16/2016    RARE WBC PRESENT, PREDOMINANTLY PMN RARE GRAM NEGATIVE RODS    CULT  04/16/2016    FEW METHICILLIN RESISTANT STAPHYLOCOCCUS AUREUS FEW MORAXELLA SPECIES BETA LACTAMASE POSITIVE NO ANAEROBES ISOLATED    LABORGA METHICILLIN RESISTANT STAPHYLOCOCCUS AUREUS 04/16/2016     Lab Results  Component Value Date   ALBUMIN 3.2 (L) 04/17/2016   ALBUMIN 3.1 (L) 04/16/2016   ALBUMIN 3.0 (L) 04/15/2016    Lab Results  Component Value Date  MG 2.1 04/16/2016   No results found for: VD25OH  No results found for: PREALBUMIN CBC EXTENDED Latest Ref Rng & Units 12/15/2016 04/17/2016 04/16/2016  WBC 4.0 - 10.5 K/uL 8.2 8.6 7.4  RBC 4.22 - 5.81 MIL/uL 3.91(L) 3.79(L) 3.73(L)  HGB 13.0 - 17.0 g/dL 11.1(L) 10.3(L) 10.1(L)  HCT 39.0 - 52.0 % 36.9(L) 34.7(L) 34.2(L)  PLT 150 - 400 K/uL 265 257 239  NEUTROABS 1.7 - 7.7 K/uL - - -  LYMPHSABS 0.7 - 4.0 K/uL - - -     There is no height or weight on file to calculate BMI.  Orders:  No orders of the defined types were placed in this encounter.  No orders of the defined types were placed in this encounter.    Procedures: No procedures performed  Clinical Data: No additional findings.  ROS:  All other  systems negative, except as noted in the HPI. Review of Systems  Objective: Vital Signs: There were no vitals taken for this visit.  Specialty Comments:  No specialty comments available.  PMFS History: Patient Active Problem List   Diagnosis Date Noted  . Type 2 diabetes mellitus with proliferative retinopathy of both eyes, with long-term current use of insulin ([place]) 07/27/2019  . Charcot's joint, left ankle and foot 10/29/2016  . History of right below knee amputation ([place]) 10/29/2016  . Diabetic polyneuropathy associated with type 2 diabetes mellitus ([place]) 10/29/2016  . ESRD (end stage renal disease) on dialysis ([place]) 04/15/2016  . Chronic osteomyelitis of toe of left foot ([place]) 04/15/2016  . Left renal mass 07/11/2015  . Biliary calculi 09/14/2014  . Hemodialysis-associated hypotension 09/13/2014  . H/O amputation of leg through tibia and fibula ([place]) 09/08/2014  . Anemia associated with chronic renal failure 09/06/2014  . Type 2 diabetes mellitus with diabetic neuropathy ([place]) 08/07/2014  . Essential hypertension 08/07/2014  . Obesity 08/07/2014  . Hypothyroidism 08/07/2014  . Kidney lump 04/06/2014  . Obstructive apnea 04/06/2014  . Diabetes mellitus ([place]) 03/23/2014  . Patient awaiting renal transplant 03/23/2014  . Asthma, moderate persistent 10/28/2013  . Anemia due to blood loss 09/14/2013  . Morbid obesity ([place]) 09/11/2013  . Morbid (severe) obesity due to excess calories ([place]) 09/11/2013  . Chronic kidney disease, stage IV (severe) ([place]) 09/10/2013  . Type 2 diabetes mellitus ([place]) 09/10/2013  . Mild persistent asthma with acute exacerbation 09/10/2013  . Abnormal ECG 05/07/2013  . CAD in native artery 05/07/2013   Past Medical History:  Diagnosis Date  . Anemia   . Arthritis   . Cancer [facility])    Renal Tumor  . CKD (chronic kidney disease)   . Coronary artery disease   . Diabetes ([place])   . DM (diabetes mellitus) with complications ([place])   . ESRD (end stage  renal disease) ([place])    Pre- dialysis  . Hypertension   . Left kidney mass   . Neuropathy   . Obesity   . Osteomyelitis, chronic, ankle or foot ([place])   . PONV (postoperative nausea and vomiting)   . Renal insufficiency    Patient is on Dialysis and receives M,W and F.  . S/P BKA (below knee amputation) [facility]) June 2016   Right , [place], [place]  . Sleep apnea    CPAP  . Thyroid disease     Family History  Problem Relation Age of Onset  . Diabetes Mother   . Kidney disease Mother   . Breast cancer Mother     Past Surgical History:  Procedure Laterality Date  . BASCILIC VEIN TRANSPOSITION Left 02/17/2015   Procedure: BASCILIC VEIN TRANSPOSITION;  Surgeon: Katha Cabal, MD;  Location: [facility] ORS;  Service: Vascular;  Laterality: Left;  . BELOW KNEE LEG AMPUTATION Right   . CORONARY ANGIOPLASTY WITH STENT PLACEMENT    . EYE SURGERY    . FOOT SURGERY     Multiple R foot surgery for infection and charcot foot  . I & D EXTREMITY Left 04/16/2016   Procedure: IRRIGATION AND DEBRIDEMENT EXTREMITY/GREAT TOE AMP.;  Surgeon: Edrick Kins, DPM;  Location: [place];  Service: Podiatry;  Laterality: Left;  . JOINT REPLACEMENT    . LAPAROSCOPIC NEPHRECTOMY, HAND ASSISTED Left 07/11/2015   Procedure: HAND ASSISTED LAPAROSCOPIC NEPHRECTOMY;  Surgeon: Hollice Espy, MD;  Location: [facility];  Service: Urology;  Laterality: Left;  . PERIPHERAL VASCULAR CATHETERIZATION Left 12/06/2014   Procedure: A/V Shuntogram/Fistulagram;  Surgeon: Katha Cabal, MD;  Location: [place] [facility];  Service: Cardiovascular;  Laterality: Left;  . PERIPHERAL VASCULAR CATHETERIZATION Left 12/06/2014   Procedure: A/V Shunt Intervention;  Surgeon: Katha Cabal, MD;  Location: [place] [facility];  Service: Cardiovascular;  Laterality: Left;  . TONSILLECTOMY     Social History   Occupational History  . Not on file  Tobacco Use  . Smoking status: Never Smoker  . Smokeless tobacco: Never Used  Vaping Use   . Vaping Use: Never used  Substance and Sexual Activity  . Alcohol use: No  . Drug use: No  . Sexual activity: Yes

## 2020-08-16 DIAGNOSIS — D689 Coagulation defect, unspecified: Secondary | ICD-10-CM | POA: Diagnosis not present

## 2020-08-16 DIAGNOSIS — N2581 Secondary hyperparathyroidism of renal origin: Secondary | ICD-10-CM | POA: Diagnosis not present

## 2020-08-16 DIAGNOSIS — Z992 Dependence on renal dialysis: Secondary | ICD-10-CM | POA: Diagnosis not present

## 2020-08-16 DIAGNOSIS — N186 End stage renal disease: Secondary | ICD-10-CM | POA: Diagnosis not present

## 2020-08-18 DIAGNOSIS — N186 End stage renal disease: Secondary | ICD-10-CM | POA: Diagnosis not present

## 2020-08-18 DIAGNOSIS — Z992 Dependence on renal dialysis: Secondary | ICD-10-CM | POA: Diagnosis not present

## 2020-08-18 DIAGNOSIS — D689 Coagulation defect, unspecified: Secondary | ICD-10-CM | POA: Diagnosis not present

## 2020-08-18 DIAGNOSIS — N2581 Secondary hyperparathyroidism of renal origin: Secondary | ICD-10-CM | POA: Diagnosis not present

## 2020-08-21 DIAGNOSIS — N186 End stage renal disease: Secondary | ICD-10-CM | POA: Diagnosis not present

## 2020-08-21 DIAGNOSIS — D689 Coagulation defect, unspecified: Secondary | ICD-10-CM | POA: Diagnosis not present

## 2020-08-21 DIAGNOSIS — R52 Pain, unspecified: Secondary | ICD-10-CM | POA: Diagnosis not present

## 2020-08-21 DIAGNOSIS — Z992 Dependence on renal dialysis: Secondary | ICD-10-CM | POA: Diagnosis not present

## 2020-08-21 DIAGNOSIS — N2581 Secondary hyperparathyroidism of renal origin: Secondary | ICD-10-CM | POA: Diagnosis not present

## 2020-08-23 DIAGNOSIS — N186 End stage renal disease: Secondary | ICD-10-CM | POA: Diagnosis not present

## 2020-08-23 DIAGNOSIS — R52 Pain, unspecified: Secondary | ICD-10-CM | POA: Diagnosis not present

## 2020-08-23 DIAGNOSIS — D689 Coagulation defect, unspecified: Secondary | ICD-10-CM | POA: Diagnosis not present

## 2020-08-23 DIAGNOSIS — Z992 Dependence on renal dialysis: Secondary | ICD-10-CM | POA: Diagnosis not present

## 2020-08-23 DIAGNOSIS — N2581 Secondary hyperparathyroidism of renal origin: Secondary | ICD-10-CM | POA: Diagnosis not present

## 2020-08-25 DIAGNOSIS — N2581 Secondary hyperparathyroidism of renal origin: Secondary | ICD-10-CM | POA: Diagnosis not present

## 2020-08-25 DIAGNOSIS — Z992 Dependence on renal dialysis: Secondary | ICD-10-CM | POA: Diagnosis not present

## 2020-08-25 DIAGNOSIS — R52 Pain, unspecified: Secondary | ICD-10-CM | POA: Diagnosis not present

## 2020-08-25 DIAGNOSIS — N186 End stage renal disease: Secondary | ICD-10-CM | POA: Diagnosis not present

## 2020-08-25 DIAGNOSIS — D689 Coagulation defect, unspecified: Secondary | ICD-10-CM | POA: Diagnosis not present

## 2020-08-28 ENCOUNTER — Encounter: Admit: 2020-08-28 | Discharge: 2020-08-28 | Payer: MEDICARE | Attending: Surgery | Primary: Surgery

## 2020-08-28 DIAGNOSIS — D689 Coagulation defect, unspecified: Secondary | ICD-10-CM | POA: Diagnosis not present

## 2020-08-28 DIAGNOSIS — D631 Anemia in chronic kidney disease: Secondary | ICD-10-CM | POA: Diagnosis not present

## 2020-08-28 DIAGNOSIS — Z992 Dependence on renal dialysis: Secondary | ICD-10-CM | POA: Diagnosis not present

## 2020-08-28 DIAGNOSIS — N186 End stage renal disease: Secondary | ICD-10-CM | POA: Diagnosis not present

## 2020-08-28 DIAGNOSIS — N2581 Secondary hyperparathyroidism of renal origin: Secondary | ICD-10-CM | POA: Diagnosis not present

## 2020-08-29 DIAGNOSIS — N186 End stage renal disease: Secondary | ICD-10-CM | POA: Diagnosis not present

## 2020-08-29 DIAGNOSIS — E1122 Type 2 diabetes mellitus with diabetic chronic kidney disease: Secondary | ICD-10-CM | POA: Diagnosis not present

## 2020-08-29 DIAGNOSIS — Z992 Dependence on renal dialysis: Secondary | ICD-10-CM | POA: Diagnosis not present

## 2020-08-30 DIAGNOSIS — Z992 Dependence on renal dialysis: Secondary | ICD-10-CM | POA: Diagnosis not present

## 2020-08-30 DIAGNOSIS — N2581 Secondary hyperparathyroidism of renal origin: Secondary | ICD-10-CM | POA: Diagnosis not present

## 2020-08-30 DIAGNOSIS — D631 Anemia in chronic kidney disease: Secondary | ICD-10-CM | POA: Diagnosis not present

## 2020-08-30 DIAGNOSIS — E039 Hypothyroidism, unspecified: Secondary | ICD-10-CM | POA: Diagnosis not present

## 2020-08-30 DIAGNOSIS — D689 Coagulation defect, unspecified: Secondary | ICD-10-CM | POA: Diagnosis not present

## 2020-08-30 DIAGNOSIS — D509 Iron deficiency anemia, unspecified: Secondary | ICD-10-CM | POA: Diagnosis not present

## 2020-08-30 DIAGNOSIS — N186 End stage renal disease: Secondary | ICD-10-CM | POA: Diagnosis not present

## 2020-08-31 ENCOUNTER — Encounter: Admit: 2020-08-31 | Discharge: 2020-08-31 | Payer: MEDICARE | Attending: Registered" | Primary: Registered"

## 2020-08-31 ENCOUNTER — Encounter: Admit: 2020-08-31 | Discharge: 2020-08-31 | Payer: MEDICARE

## 2020-08-31 ENCOUNTER — Encounter: Admit: 2020-08-31 | Discharge: 2020-09-01 | Payer: MEDICARE | Attending: Nephrology | Primary: Nephrology

## 2020-08-31 DIAGNOSIS — E119 Type 2 diabetes mellitus without complications: Secondary | ICD-10-CM | POA: Diagnosis not present

## 2020-08-31 DIAGNOSIS — Z01818 Encounter for other preprocedural examination: Secondary | ICD-10-CM | POA: Diagnosis not present

## 2020-08-31 DIAGNOSIS — Z23 Encounter for immunization: Secondary | ICD-10-CM | POA: Diagnosis not present

## 2020-09-01 DIAGNOSIS — Z992 Dependence on renal dialysis: Secondary | ICD-10-CM | POA: Diagnosis not present

## 2020-09-01 DIAGNOSIS — D509 Iron deficiency anemia, unspecified: Secondary | ICD-10-CM | POA: Diagnosis not present

## 2020-09-01 DIAGNOSIS — N186 End stage renal disease: Secondary | ICD-10-CM | POA: Diagnosis not present

## 2020-09-01 DIAGNOSIS — D631 Anemia in chronic kidney disease: Secondary | ICD-10-CM | POA: Diagnosis not present

## 2020-09-01 DIAGNOSIS — D689 Coagulation defect, unspecified: Secondary | ICD-10-CM | POA: Diagnosis not present

## 2020-09-01 DIAGNOSIS — N2581 Secondary hyperparathyroidism of renal origin: Secondary | ICD-10-CM | POA: Diagnosis not present

## 2020-09-01 DIAGNOSIS — E039 Hypothyroidism, unspecified: Secondary | ICD-10-CM | POA: Diagnosis not present

## 2020-09-04 DIAGNOSIS — Z7682 Awaiting organ transplant status: Principal | ICD-10-CM

## 2020-09-04 DIAGNOSIS — Z992 Dependence on renal dialysis: Secondary | ICD-10-CM | POA: Diagnosis not present

## 2020-09-04 DIAGNOSIS — N186 End stage renal disease: Secondary | ICD-10-CM | POA: Diagnosis not present

## 2020-09-04 DIAGNOSIS — N2581 Secondary hyperparathyroidism of renal origin: Secondary | ICD-10-CM | POA: Diagnosis not present

## 2020-09-04 DIAGNOSIS — R52 Pain, unspecified: Secondary | ICD-10-CM | POA: Diagnosis not present

## 2020-09-04 DIAGNOSIS — D689 Coagulation defect, unspecified: Secondary | ICD-10-CM | POA: Diagnosis not present

## 2020-09-05 ENCOUNTER — Ambulatory Visit (INDEPENDENT_AMBULATORY_CARE_PROVIDER_SITE_OTHER): Payer: Medicare Other | Admitting: Physician Assistant

## 2020-09-05 ENCOUNTER — Encounter: Payer: Self-pay | Admitting: Orthopedic Surgery

## 2020-09-05 DIAGNOSIS — T8789 Other complications of amputation stump: Secondary | ICD-10-CM | POA: Diagnosis not present

## 2020-09-05 DIAGNOSIS — L97919 Non-pressure chronic ulcer of unspecified part of right lower leg with unspecified severity: Secondary | ICD-10-CM

## 2020-09-05 NOTE — Progress Notes (Signed)
Office Visit Note   Patient: Tim Becker           Date of Birth: [birth year]           MRN: [id number] Visit Date: 09/05/2020              Requested by: Leeroy Cha, MD [address] [place],  [place] [address] PCP: Leeroy Cha, MD  Chief Complaint  Patient presents with  . Right Leg - Follow-up    HX BKA at another facility       HPI: Patient presents today for follow-up on his right below-knee amputation.  He had an amputation done elsewhere and has Had some continuous callus and drainage.  He admits he thinks this started when he was wearing his prosthetic at night.  Since being evaluated by [place] he is using his shrinker and removing the prosthetic at night.  He is hoping he does not need surgery he is anticipating a kidney transplant in the near future Assessment & Plan: Visit Diagnoses: No diagnosis found.  Plan: Looks good I told him to follow-up in 2 months or sooner if any concerns.  Continue to wear the shrinker and allow the stump to air out as much as possible.  Follow-Up Instructions: No follow-ups on file.   Ortho Exam  Patient is alert, oriented, no adenopathy, well-dressed, normal affect, normal respiratory effort. Examination of the stump he has 1 thickened callused area with some cracking but does not have any drainage today no surrounding erythema no pressure points from the prosthetic no swelling or signs of infection.  No wound dehiscence  Imaging: No results found. No images are attached to the encounter.  Labs: Lab Results  Component Value Date   HGBA1C 8.6 (H) 04/16/2016   ESRSEDRATE 74 (H) 04/15/2016   ESRSEDRATE 127 (H) 08/10/2014   CRP 13.3 (H) 04/15/2016   CRP 8.9 (H) 08/10/2014   REPTSTATUS 04/24/2016 FINAL 04/16/2016   GRAMSTAIN  04/16/2016    RARE WBC PRESENT, PREDOMINANTLY PMN RARE GRAM NEGATIVE RODS    CULT  04/16/2016    FEW METHICILLIN RESISTANT STAPHYLOCOCCUS AUREUS FEW MORAXELLA SPECIES BETA  LACTAMASE POSITIVE NO ANAEROBES ISOLATED    LABORGA METHICILLIN RESISTANT STAPHYLOCOCCUS AUREUS 04/16/2016     Lab Results  Component Value Date   ALBUMIN 3.2 (L) 04/17/2016   ALBUMIN 3.1 (L) 04/16/2016   ALBUMIN 3.0 (L) 04/15/2016    Lab Results  Component Value Date   MG 2.1 04/16/2016   No results found for: VD25OH  No results found for: PREALBUMIN CBC EXTENDED Latest Ref Rng & Units 12/15/2016 04/17/2016 04/16/2016  WBC 4.0 - 10.5 K/uL 8.2 8.6 7.4  RBC 4.22 - 5.81 MIL/uL 3.91(L) 3.79(L) 3.73(L)  HGB 13.0 - 17.0 g/dL 11.1(L) 10.3(L) 10.1(L)  HCT 39.0 - 52.0 % 36.9(L) 34.7(L) 34.2(L)  PLT 150 - 400 K/uL 265 257 239  NEUTROABS 1.7 - 7.7 K/uL - - -  LYMPHSABS 0.7 - 4.0 K/uL - - -     There is no height or weight on file to calculate BMI.  Orders:  No orders of the defined types were placed in this encounter.  No orders of the defined types were placed in this encounter.    Procedures: No procedures performed  Clinical Data: No additional findings.  ROS:  All other systems negative, except as noted in the HPI. Review of Systems  Objective: Vital Signs: There were no vitals taken for this visit.  Specialty Comments:  No  specialty comments available.  PMFS History: Patient Active Problem List   Diagnosis Date Noted  . Type 2 diabetes mellitus with proliferative retinopathy of both eyes, with long-term current use of insulin ([place]) 07/27/2019  . Charcot's joint, left ankle and foot 10/29/2016  . History of right below knee amputation ([place]) 10/29/2016  . Diabetic polyneuropathy associated with type 2 diabetes mellitus ([place]) 10/29/2016  . ESRD (end stage renal disease) on dialysis ([place]) 04/15/2016  . Chronic osteomyelitis of toe of left foot ([place]) 04/15/2016  . Left renal mass 07/11/2015  . Biliary calculi 09/14/2014  . Hemodialysis-associated hypotension 09/13/2014  . H/O amputation of leg through tibia and fibula ([place]) 09/08/2014  . Anemia associated with  chronic renal failure 09/06/2014  . Type 2 diabetes mellitus with diabetic neuropathy ([place]) 08/07/2014  . Essential hypertension 08/07/2014  . Obesity 08/07/2014  . Hypothyroidism 08/07/2014  . Kidney lump 04/06/2014  . Obstructive apnea 04/06/2014  . Diabetes mellitus ([place]) 03/23/2014  . Patient awaiting renal transplant 03/23/2014  . Asthma, moderate persistent 10/28/2013  . Anemia due to blood loss 09/14/2013  . Morbid obesity ([place]) 09/11/2013  . Morbid (severe) obesity due to excess calories ([place]) 09/11/2013  . Chronic kidney disease, stage IV (severe) ([place]) 09/10/2013  . Type 2 diabetes mellitus ([place]) 09/10/2013  . Mild persistent asthma with acute exacerbation 09/10/2013  . Abnormal ECG 05/07/2013  . CAD in native artery 05/07/2013   Past Medical History:  Diagnosis Date  . Anemia   . Arthritis   . Cancer [facility])    Renal Tumor  . CKD (chronic kidney disease)   . Coronary artery disease   . Diabetes ([place])   . DM (diabetes mellitus) with complications ([place])   . ESRD (end stage renal disease) ([place])    Pre- dialysis  . Hypertension   . Left kidney mass   . Neuropathy   . Obesity   . Osteomyelitis, chronic, ankle or foot ([place])   . PONV (postoperative nausea and vomiting)   . Renal insufficiency    Patient is on Dialysis and receives M,W and F.  . S/P BKA (below knee amputation) [facility]) June 2016   Right , [place], [place]  . Sleep apnea    CPAP  . Thyroid disease     Family History  Problem Relation Age of Onset  . Diabetes Mother   . Kidney disease Mother   . Breast cancer Mother     Past Surgical History:  Procedure Laterality Date  . BASCILIC VEIN TRANSPOSITION Left 02/17/2015   Procedure: BASCILIC VEIN TRANSPOSITION;  Surgeon: Katha Cabal, MD;  Location: [facility] ORS;  Service: Vascular;  Laterality: Left;  . BELOW KNEE LEG AMPUTATION Right   . CORONARY ANGIOPLASTY WITH STENT PLACEMENT    . EYE SURGERY    . FOOT SURGERY     Multiple R foot surgery for  infection and charcot foot  . I & D EXTREMITY Left 04/16/2016   Procedure: IRRIGATION AND DEBRIDEMENT EXTREMITY/GREAT TOE AMP.;  Surgeon: Edrick Kins, DPM;  Location: [place];  Service: Podiatry;  Laterality: Left;  . JOINT REPLACEMENT    . LAPAROSCOPIC NEPHRECTOMY, HAND ASSISTED Left 07/11/2015   Procedure: HAND ASSISTED LAPAROSCOPIC NEPHRECTOMY;  Surgeon: Hollice Espy, MD;  Location: [facility] ORS;  Service: Urology;  Laterality: Left;  . PERIPHERAL VASCULAR CATHETERIZATION Left 12/06/2014   Procedure: A/V Shuntogram/Fistulagram;  Surgeon: Katha Cabal, MD;  Location: [place] [facility];  Service: Cardiovascular;  Laterality: Left;  . PERIPHERAL VASCULAR  CATHETERIZATION Left 12/06/2014   Procedure: A/V Shunt Intervention;  Surgeon: Katha Cabal, MD;  Location: [place] [facility];  Service: Cardiovascular;  Laterality: Left;  . TONSILLECTOMY     Social History   Occupational History  . Not on file  Tobacco Use  . Smoking status: Never Smoker  . Smokeless tobacco: Never Used  Vaping Use  . Vaping Use: Never used  Substance and Sexual Activity  . Alcohol use: No  . Drug use: No  . Sexual activity: Yes

## 2020-09-06 DIAGNOSIS — D689 Coagulation defect, unspecified: Secondary | ICD-10-CM | POA: Diagnosis not present

## 2020-09-06 DIAGNOSIS — N186 End stage renal disease: Secondary | ICD-10-CM | POA: Diagnosis not present

## 2020-09-06 DIAGNOSIS — R52 Pain, unspecified: Secondary | ICD-10-CM | POA: Diagnosis not present

## 2020-09-06 DIAGNOSIS — Z992 Dependence on renal dialysis: Secondary | ICD-10-CM | POA: Diagnosis not present

## 2020-09-06 DIAGNOSIS — N2581 Secondary hyperparathyroidism of renal origin: Secondary | ICD-10-CM | POA: Diagnosis not present

## 2020-09-08 DIAGNOSIS — R911 Solitary pulmonary nodule: Principal | ICD-10-CM

## 2020-09-08 DIAGNOSIS — Z992 Dependence on renal dialysis: Secondary | ICD-10-CM | POA: Diagnosis not present

## 2020-09-08 DIAGNOSIS — D689 Coagulation defect, unspecified: Secondary | ICD-10-CM | POA: Diagnosis not present

## 2020-09-08 DIAGNOSIS — R52 Pain, unspecified: Secondary | ICD-10-CM | POA: Diagnosis not present

## 2020-09-08 DIAGNOSIS — N186 End stage renal disease: Secondary | ICD-10-CM | POA: Diagnosis not present

## 2020-09-08 DIAGNOSIS — N2581 Secondary hyperparathyroidism of renal origin: Secondary | ICD-10-CM | POA: Diagnosis not present

## 2020-09-11 DIAGNOSIS — R52 Pain, unspecified: Secondary | ICD-10-CM | POA: Diagnosis not present

## 2020-09-11 DIAGNOSIS — D631 Anemia in chronic kidney disease: Secondary | ICD-10-CM | POA: Diagnosis not present

## 2020-09-11 DIAGNOSIS — Z992 Dependence on renal dialysis: Secondary | ICD-10-CM | POA: Diagnosis not present

## 2020-09-11 DIAGNOSIS — N186 End stage renal disease: Secondary | ICD-10-CM | POA: Diagnosis not present

## 2020-09-11 DIAGNOSIS — D689 Coagulation defect, unspecified: Secondary | ICD-10-CM | POA: Diagnosis not present

## 2020-09-11 DIAGNOSIS — N2581 Secondary hyperparathyroidism of renal origin: Secondary | ICD-10-CM | POA: Diagnosis not present

## 2020-09-12 ENCOUNTER — Other Ambulatory Visit: Payer: Self-pay

## 2020-09-12 ENCOUNTER — Ambulatory Visit (INDEPENDENT_AMBULATORY_CARE_PROVIDER_SITE_OTHER): Payer: Medicare Other | Admitting: Ophthalmology

## 2020-09-12 ENCOUNTER — Encounter (INDEPENDENT_AMBULATORY_CARE_PROVIDER_SITE_OTHER): Payer: Self-pay | Admitting: Ophthalmology

## 2020-09-12 DIAGNOSIS — E113491 Type 2 diabetes mellitus with severe nonproliferative diabetic retinopathy without macular edema, right eye: Secondary | ICD-10-CM

## 2020-09-12 DIAGNOSIS — E113492 Type 2 diabetes mellitus with severe nonproliferative diabetic retinopathy without macular edema, left eye: Secondary | ICD-10-CM

## 2020-09-12 DIAGNOSIS — H2513 Age-related nuclear cataract, bilateral: Secondary | ICD-10-CM | POA: Insufficient documentation

## 2020-09-12 NOTE — Assessment & Plan Note (Signed)

## 2020-09-12 NOTE — Progress Notes (Signed)
09/12/2020     CHIEF COMPLAINT Patient presents for Retina Follow Up (2 Year F/U OU//Pt sts VA OU has decreased slightly over time. Pt sts blood sugar has improved, but vision has not improved. Pt sts insulin has decreased as well, with no visual improvements. /A1c: 5.8, 08/2020/LBS: 147 this AM)   HISTORY OF PRESENT ILLNESS: Tim Becker is a 55 y.o. male who presents to the clinic today for:   HPI     Retina Follow Up           Diagnosis: Diabetic Retinopathy   Laterality: both eyes   Onset: 2 years ago   Severity: moderate   Duration: 2 years   Course: gradually worsening   Comments: 2 Year F/U OU  Pt sts VA OU has decreased slightly over time. Pt sts blood sugar has improved, but vision has not improved. Pt sts insulin has decreased as well, with no visual improvements.  A1c: 5.8, 08/2020 LBS: 147 this AM       Last edited by Milly Jakob, [place] on 09/12/2020  8:45 AM.      Referring physician: Leeroy Cha, MD [address] [place],  [place] [address]  HISTORICAL INFORMATION:   Selected notes from the MEDICAL RECORD NUMBER    Lab Results  Component Value Date   HGBA1C 8.6 (H) 04/16/2016     CURRENT MEDICATIONS: No current outpatient medications on file. (Ophthalmic Drugs)   No current facility-administered medications for this visit. (Ophthalmic Drugs)   Current Outpatient Medications (Other)  Medication Sig   Accu-Chek Softclix Lancets lancets Accu-Chek Softclix Lancets   acetaminophen (TYLENOL) 500 MG tablet Take 1,000 mg by mouth every 6 (six) hours as needed for headache (pain).   AMBULATORY NON FORMULARY MEDICATION Nitroglycerine ointment 0.125 %  Apply a pea sized amount internally four times daily. Dispense 30 GM (Patient taking differently: Nitroglycerine ointment 0.125 %  Apply a pea sized amount internally four times daily as needed. Dispense 30 GM)   amLODipine (NORVASC) 10 MG tablet    atorvastatin (LIPITOR) 40 MG  tablet Take 1 tablet (40 mg total) by mouth daily.   calcium acetate (PHOSLO) 667 MG capsule Take by mouth.   carvedilol (COREG) 25 MG tablet Take 25 mg by mouth 2 (two) times daily with a meal.    doxycycline (VIBRA-TABS) 100 MG tablet Take 1 tablet (100 mg total) by mouth 2 (two) times daily.   gentamicin cream (GARAMYCIN) 0.1 % Apply 1 application topically 2 (two) times daily.   Glucose Blood (ONETOUCH TEST VI) by In Vitro route.   insulin glargine (LANTUS) 100 unit/mL SOPN Inject 80 Units into the skin daily before breakfast.   insulin lispro (HUMALOG KWIKPEN) 200 UNIT/ML KwikPen Inject 20 Units into the skin with breakfast, with lunch, and with evening meal.   oxyCODONE-acetaminophen (PERCOCET) 5-325 MG tablet Take 1 tablet by mouth every 8 (eight) hours as needed for severe pain. (Patient not taking: Reported on 06/27/2020)   PRESCRIPTION MEDICATION Inhale into the lungs at bedtime. CPAP   tiZANidine (ZANAFLEX) 2 MG tablet Take 1 tablet (2 mg total) by mouth 2 (two) times daily as needed for muscle spasms.   No current facility-administered medications for this visit. (Other)      REVIEW OF SYSTEMS:    ALLERGIES Allergies  Allergen Reactions   Ferric Citrate Nausea And Vomiting   No Known Allergies     PAST MEDICAL HISTORY Past Medical History:  Diagnosis Date   Anemia  Arthritis    Cancer ([place])    Renal Tumor   CKD (chronic kidney disease)    Coronary artery disease    Diabetes (HCC)    DM (diabetes mellitus) with complications (HCC)    ESRD (end stage renal disease) ([place])    Pre- dialysis   Hypertension    Left kidney mass    Neuropathy    Obesity    Osteomyelitis, chronic, ankle or foot (HCC)    PONV (postoperative nausea and vomiting)    Renal insufficiency    Patient is on Dialysis and receives M,W and F.   S/P BKA (below knee amputation) [facility]) June 2016   Right , [place], [place]   Sleep apnea    CPAP   Thyroid disease    Past Surgical History:   Procedure Laterality Date   BASCILIC VEIN TRANSPOSITION Left 02/17/2015   Procedure: BASCILIC VEIN TRANSPOSITION;  Surgeon: Katha Cabal, MD;  Location: [facility] ORS;  Service: Vascular;  Laterality: Left;   BELOW KNEE LEG AMPUTATION Right    CORONARY ANGIOPLASTY WITH STENT PLACEMENT     EYE SURGERY     FOOT SURGERY     Multiple R foot surgery for infection and charcot foot   I & D EXTREMITY Left 04/16/2016   Procedure: IRRIGATION AND DEBRIDEMENT EXTREMITY/GREAT TOE AMP.;  Surgeon: Edrick Kins, DPM;  Location: [place];  Service: Podiatry;  Laterality: Left;   JOINT REPLACEMENT     LAPAROSCOPIC NEPHRECTOMY, HAND ASSISTED Left 07/11/2015   Procedure: HAND ASSISTED LAPAROSCOPIC NEPHRECTOMY;  Surgeon: Hollice Espy, MD;  Location: [facility] ORS;  Service: Urology;  Laterality: Left;   PERIPHERAL VASCULAR CATHETERIZATION Left 12/06/2014   Procedure: A/V Shuntogram/Fistulagram;  Surgeon: Katha Cabal, MD;  Location: [place] [facility];  Service: Cardiovascular;  Laterality: Left;   PERIPHERAL VASCULAR CATHETERIZATION Left 12/06/2014   Procedure: A/V Shunt Intervention;  Surgeon: Katha Cabal, MD;  Location: [place] CV LAB;  Service: Cardiovascular;  Laterality: Left;   TONSILLECTOMY      FAMILY HISTORY Family History  Problem Relation Age of Onset   Diabetes Mother    Kidney disease Mother    Breast cancer Mother     SOCIAL HISTORY Social History   Tobacco Use   Smoking status: Never   Smokeless tobacco: Never  Vaping Use   Vaping Use: Never used  Substance Use Topics   Alcohol use: No   Drug use: No         OPHTHALMIC EXAM:  Base Eye Exam     Visual Acuity (ETDRS)       Right Left   Dist [place] 20/25 20/25 -1         Tonometry (Tonopen, 8:49 AM)       Right Left   Pressure 17 16         Pupils       Pupils Dark Light Shape React APD   Right PERRL 3 3 Round Minimal None   Left PERRL 3 3 Round Minimal None         Visual Fields (Counting  fingers)       Left Right    Full Full         Extraocular Movement       Right Left    Full Full         Neuro/Psych     Oriented x3: Yes   Mood/Affect: Normal         Dilation  Both eyes: 1.0% Mydriacyl, 2.5% Phenylephrine @ 8:49 AM           Slit Lamp and Fundus Exam     External Exam       Right Left   External Normal Normal         Slit Lamp Exam       Right Left   Lids/Lashes Normal Normal   Conjunctiva/Sclera White and quiet White and quiet   Cornea Clear Clear   Anterior Chamber Deep and quiet Deep and quiet   Iris Round and reactive Round and reactive   Lens Nuclear sclerosis Nuclear sclerosis   Anterior Vitreous Normal Normal         Fundus Exam       Right Left   Posterior Vitreous Normal Normal   Disc Normal Normal   C/D Ratio 0.3 0.4   Macula Normal Normal   Vessels NPDR- Moderate NPDR- Moderate   Periphery Normal Normal            IMAGING AND PROCEDURES  Imaging and Procedures for 09/12/20  Color Fundus Photography Optos - OU - Both Eyes       Right Eye Progression has been stable. Disc findings include normal observations. Macula : normal observations. Periphery : normal observations.   Left Eye Progression has been stable. Disc findings include normal observations. Macula : normal observations. Periphery : normal observations.   Notes Moderate to severe NPDR stable no active maculopathy     OCT, Retina - OU - Both Eyes       Right Eye Quality was good. Scan locations included subfoveal. Central Foveal Thickness: 221. Progression has been stable. Findings include normal foveal contour.   Left Eye Quality was good. Scan locations included subfoveal. Central Foveal Thickness: 209. Progression has been stable. Findings include normal foveal contour.              ASSESSMENT/PLAN:  Severe nonproliferative diabetic retinopathy of right eye (HCC) Observe as patient's blood sugar control has nicely  improved  Severe nonproliferative diabetic retinopathy of left eye ([place]) Continue to observe as patient has had improvement marked improvement in blood sugar control  Nuclear sclerotic cataract of both eyes The nature of cataract was discussed with the patient as well as the elective nature of surgery. The patient was reassured that surgery at a later date does not put the patient at risk for a worse outcome. It was emphasized that the need for surgery is dictated by the patient's quality of life as influenced by the cataract. Patient was instructed to maintain close follow up with their general eye care doctor.     ICD-10-CM   1. Severe nonproliferative diabetic retinopathy of right eye without macular edema associated with type 2 diabetes mellitus (HCC)  Z61.0960 Color Fundus Photography Optos - OU - Both Eyes    OCT, Retina - OU - Both Eyes    2. Severe nonproliferative diabetic retinopathy of left eye without macular edema associated with type 2 diabetes mellitus (HCC)  A54.0981 OCT, Retina - OU - Both Eyes    3. Nuclear sclerotic cataract of both eyes  H25.13       1.  2.  3.  Ophthalmic Meds Ordered this visit:  No orders of the defined types were placed in this encounter.      Return in about 1 year (around 09/12/2021) for DILATE OU, COLOR FP, OCT.  There are no Patient Instructions on file for this visit.   Explained  the diagnoses, plan, and follow up with the patient and they expressed understanding.  Patient expressed understanding of the importance of proper follow up care.   Clent Demark Bedie Dominey M.D. Diseases & Surgery of the Retina and Vitreous Retina & Diabetic [place] 09/12/20     Abbreviations: M myopia (nearsighted); A astigmatism; H hyperopia (farsighted); P presbyopia; Mrx spectacle prescription;  CTL contact lenses; OD right eye; OS left eye; OU both eyes  XT exotropia; ET esotropia; PEK punctate epithelial keratitis; PEE punctate epithelial erosions; DES  dry eye syndrome; MGD meibomian gland dysfunction; ATs artificial tears; PFAT's preservative free artificial tears; [place] nuclear sclerotic cataract; PSC posterior subcapsular cataract; ERM epi-retinal membrane; PVD posterior vitreous detachment; RD retinal detachment; DM diabetes mellitus; DR diabetic retinopathy; NPDR non-proliferative diabetic retinopathy; PDR proliferative diabetic retinopathy; CSME clinically significant macular edema; DME diabetic macular edema; dbh dot blot hemorrhages; CWS cotton wool spot; POAG primary open angle glaucoma; C/D cup-to-disc ratio; HVF humphrey visual field; GVF goldmann visual field; OCT optical coherence tomography; IOP intraocular pressure; BRVO Branch retinal vein occlusion; CRVO central retinal vein occlusion; CRAO central retinal artery occlusion; BRAO branch retinal artery occlusion; RT retinal tear; SB scleral buckle; PPV pars plana vitrectomy; VH Vitreous hemorrhage; PRP panretinal laser photocoagulation; IVK intravitreal kenalog; VMT vitreomacular traction; MH Macular hole;  NVD neovascularization of the disc; NVE neovascularization elsewhere; AREDS age related eye disease study; ARMD age related macular degeneration; POAG primary open angle glaucoma; EBMD epithelial/anterior basement membrane dystrophy; ACIOL anterior chamber intraocular lens; IOL intraocular lens; PCIOL posterior chamber intraocular lens; Phaco/IOL phacoemulsification with intraocular lens placement; [place] photorefractive keratectomy; LASIK laser assisted in situ keratomileusis; HTN hypertension; DM diabetes mellitus; COPD chronic obstructive pulmonary disease

## 2020-09-12 NOTE — Assessment & Plan Note (Signed)
Continue to observe as patient has had improvement marked improvement in blood sugar control

## 2020-09-12 NOTE — Assessment & Plan Note (Signed)
Observe as patient's blood sugar control has nicely improved

## 2020-09-13 DIAGNOSIS — N186 End stage renal disease: Secondary | ICD-10-CM | POA: Diagnosis not present

## 2020-09-13 DIAGNOSIS — D689 Coagulation defect, unspecified: Secondary | ICD-10-CM | POA: Diagnosis not present

## 2020-09-13 DIAGNOSIS — N2581 Secondary hyperparathyroidism of renal origin: Secondary | ICD-10-CM | POA: Diagnosis not present

## 2020-09-13 DIAGNOSIS — Z992 Dependence on renal dialysis: Secondary | ICD-10-CM | POA: Diagnosis not present

## 2020-09-13 DIAGNOSIS — D631 Anemia in chronic kidney disease: Secondary | ICD-10-CM | POA: Diagnosis not present

## 2020-09-13 DIAGNOSIS — R52 Pain, unspecified: Secondary | ICD-10-CM | POA: Diagnosis not present

## 2020-09-15 DIAGNOSIS — R52 Pain, unspecified: Secondary | ICD-10-CM | POA: Diagnosis not present

## 2020-09-15 DIAGNOSIS — Z992 Dependence on renal dialysis: Secondary | ICD-10-CM | POA: Diagnosis not present

## 2020-09-15 DIAGNOSIS — D631 Anemia in chronic kidney disease: Secondary | ICD-10-CM | POA: Diagnosis not present

## 2020-09-15 DIAGNOSIS — N2581 Secondary hyperparathyroidism of renal origin: Secondary | ICD-10-CM | POA: Diagnosis not present

## 2020-09-15 DIAGNOSIS — N186 End stage renal disease: Secondary | ICD-10-CM | POA: Diagnosis not present

## 2020-09-15 DIAGNOSIS — D689 Coagulation defect, unspecified: Secondary | ICD-10-CM | POA: Diagnosis not present

## 2020-09-18 DIAGNOSIS — Z992 Dependence on renal dialysis: Secondary | ICD-10-CM | POA: Diagnosis not present

## 2020-09-18 DIAGNOSIS — N186 End stage renal disease: Secondary | ICD-10-CM | POA: Diagnosis not present

## 2020-09-18 DIAGNOSIS — D689 Coagulation defect, unspecified: Secondary | ICD-10-CM | POA: Diagnosis not present

## 2020-09-18 DIAGNOSIS — N2581 Secondary hyperparathyroidism of renal origin: Secondary | ICD-10-CM | POA: Diagnosis not present

## 2020-09-20 DIAGNOSIS — D689 Coagulation defect, unspecified: Secondary | ICD-10-CM | POA: Diagnosis not present

## 2020-09-20 DIAGNOSIS — N2581 Secondary hyperparathyroidism of renal origin: Secondary | ICD-10-CM | POA: Diagnosis not present

## 2020-09-20 DIAGNOSIS — N186 End stage renal disease: Secondary | ICD-10-CM | POA: Diagnosis not present

## 2020-09-20 DIAGNOSIS — Z992 Dependence on renal dialysis: Secondary | ICD-10-CM | POA: Diagnosis not present

## 2020-09-22 DIAGNOSIS — N2581 Secondary hyperparathyroidism of renal origin: Secondary | ICD-10-CM | POA: Diagnosis not present

## 2020-09-22 DIAGNOSIS — Z992 Dependence on renal dialysis: Secondary | ICD-10-CM | POA: Diagnosis not present

## 2020-09-22 DIAGNOSIS — N186 End stage renal disease: Secondary | ICD-10-CM | POA: Diagnosis not present

## 2020-09-22 DIAGNOSIS — D689 Coagulation defect, unspecified: Secondary | ICD-10-CM | POA: Diagnosis not present

## 2020-09-25 DIAGNOSIS — D689 Coagulation defect, unspecified: Secondary | ICD-10-CM | POA: Diagnosis not present

## 2020-09-25 DIAGNOSIS — N2581 Secondary hyperparathyroidism of renal origin: Secondary | ICD-10-CM | POA: Diagnosis not present

## 2020-09-25 DIAGNOSIS — Z992 Dependence on renal dialysis: Secondary | ICD-10-CM | POA: Diagnosis not present

## 2020-09-25 DIAGNOSIS — N186 End stage renal disease: Secondary | ICD-10-CM | POA: Diagnosis not present

## 2020-09-25 DIAGNOSIS — D631 Anemia in chronic kidney disease: Secondary | ICD-10-CM | POA: Diagnosis not present

## 2020-09-27 DIAGNOSIS — N186 End stage renal disease: Secondary | ICD-10-CM | POA: Diagnosis not present

## 2020-09-27 DIAGNOSIS — D631 Anemia in chronic kidney disease: Secondary | ICD-10-CM | POA: Diagnosis not present

## 2020-09-27 DIAGNOSIS — N2581 Secondary hyperparathyroidism of renal origin: Secondary | ICD-10-CM | POA: Diagnosis not present

## 2020-09-27 DIAGNOSIS — D689 Coagulation defect, unspecified: Secondary | ICD-10-CM | POA: Diagnosis not present

## 2020-09-27 DIAGNOSIS — Z992 Dependence on renal dialysis: Secondary | ICD-10-CM | POA: Diagnosis not present

## 2020-09-28 DIAGNOSIS — N186 End stage renal disease: Secondary | ICD-10-CM | POA: Diagnosis not present

## 2020-09-28 DIAGNOSIS — E1122 Type 2 diabetes mellitus with diabetic chronic kidney disease: Secondary | ICD-10-CM | POA: Diagnosis not present

## 2020-09-28 DIAGNOSIS — Z992 Dependence on renal dialysis: Secondary | ICD-10-CM | POA: Diagnosis not present

## 2020-09-29 DIAGNOSIS — D631 Anemia in chronic kidney disease: Secondary | ICD-10-CM | POA: Diagnosis not present

## 2020-09-29 DIAGNOSIS — N2581 Secondary hyperparathyroidism of renal origin: Secondary | ICD-10-CM | POA: Diagnosis not present

## 2020-09-29 DIAGNOSIS — N186 End stage renal disease: Secondary | ICD-10-CM | POA: Diagnosis not present

## 2020-09-29 DIAGNOSIS — D509 Iron deficiency anemia, unspecified: Secondary | ICD-10-CM | POA: Diagnosis not present

## 2020-09-29 DIAGNOSIS — D689 Coagulation defect, unspecified: Secondary | ICD-10-CM | POA: Diagnosis not present

## 2020-09-29 DIAGNOSIS — E1122 Type 2 diabetes mellitus with diabetic chronic kidney disease: Secondary | ICD-10-CM | POA: Diagnosis not present

## 2020-09-29 DIAGNOSIS — E039 Hypothyroidism, unspecified: Secondary | ICD-10-CM | POA: Diagnosis not present

## 2020-09-29 DIAGNOSIS — Z992 Dependence on renal dialysis: Secondary | ICD-10-CM | POA: Diagnosis not present

## 2020-10-02 DIAGNOSIS — D689 Coagulation defect, unspecified: Secondary | ICD-10-CM | POA: Diagnosis not present

## 2020-10-02 DIAGNOSIS — D631 Anemia in chronic kidney disease: Secondary | ICD-10-CM | POA: Diagnosis not present

## 2020-10-02 DIAGNOSIS — D509 Iron deficiency anemia, unspecified: Secondary | ICD-10-CM | POA: Diagnosis not present

## 2020-10-02 DIAGNOSIS — Z992 Dependence on renal dialysis: Secondary | ICD-10-CM | POA: Diagnosis not present

## 2020-10-02 DIAGNOSIS — N186 End stage renal disease: Secondary | ICD-10-CM | POA: Diagnosis not present

## 2020-10-02 DIAGNOSIS — E039 Hypothyroidism, unspecified: Secondary | ICD-10-CM | POA: Diagnosis not present

## 2020-10-02 DIAGNOSIS — N2581 Secondary hyperparathyroidism of renal origin: Secondary | ICD-10-CM | POA: Diagnosis not present

## 2020-10-02 DIAGNOSIS — E1122 Type 2 diabetes mellitus with diabetic chronic kidney disease: Secondary | ICD-10-CM | POA: Diagnosis not present

## 2020-10-04 DIAGNOSIS — E1122 Type 2 diabetes mellitus with diabetic chronic kidney disease: Secondary | ICD-10-CM | POA: Diagnosis not present

## 2020-10-04 DIAGNOSIS — D689 Coagulation defect, unspecified: Secondary | ICD-10-CM | POA: Diagnosis not present

## 2020-10-04 DIAGNOSIS — N186 End stage renal disease: Secondary | ICD-10-CM | POA: Diagnosis not present

## 2020-10-04 DIAGNOSIS — D509 Iron deficiency anemia, unspecified: Secondary | ICD-10-CM | POA: Diagnosis not present

## 2020-10-04 DIAGNOSIS — D631 Anemia in chronic kidney disease: Secondary | ICD-10-CM | POA: Diagnosis not present

## 2020-10-04 DIAGNOSIS — E039 Hypothyroidism, unspecified: Secondary | ICD-10-CM | POA: Diagnosis not present

## 2020-10-04 DIAGNOSIS — Z992 Dependence on renal dialysis: Secondary | ICD-10-CM | POA: Diagnosis not present

## 2020-10-04 DIAGNOSIS — N2581 Secondary hyperparathyroidism of renal origin: Secondary | ICD-10-CM | POA: Diagnosis not present

## 2020-10-05 DIAGNOSIS — Z1211 Encounter for screening for malignant neoplasm of colon: Secondary | ICD-10-CM | POA: Diagnosis not present

## 2020-10-05 DIAGNOSIS — R198 Other specified symptoms and signs involving the digestive system and abdomen: Secondary | ICD-10-CM | POA: Diagnosis not present

## 2020-10-06 ENCOUNTER — Encounter: Admit: 2020-10-06 | Discharge: 2020-10-06 | Payer: MEDICARE | Attending: Nephrology | Primary: Nephrology

## 2020-10-06 DIAGNOSIS — D631 Anemia in chronic kidney disease: Secondary | ICD-10-CM | POA: Diagnosis not present

## 2020-10-06 DIAGNOSIS — Z992 Dependence on renal dialysis: Secondary | ICD-10-CM | POA: Diagnosis not present

## 2020-10-06 DIAGNOSIS — E1122 Type 2 diabetes mellitus with diabetic chronic kidney disease: Secondary | ICD-10-CM | POA: Diagnosis not present

## 2020-10-06 DIAGNOSIS — N2581 Secondary hyperparathyroidism of renal origin: Secondary | ICD-10-CM | POA: Diagnosis not present

## 2020-10-06 DIAGNOSIS — D509 Iron deficiency anemia, unspecified: Secondary | ICD-10-CM | POA: Diagnosis not present

## 2020-10-06 DIAGNOSIS — D689 Coagulation defect, unspecified: Secondary | ICD-10-CM | POA: Diagnosis not present

## 2020-10-06 DIAGNOSIS — E039 Hypothyroidism, unspecified: Secondary | ICD-10-CM | POA: Diagnosis not present

## 2020-10-06 DIAGNOSIS — N186 End stage renal disease: Secondary | ICD-10-CM | POA: Diagnosis not present

## 2020-10-09 DIAGNOSIS — E1122 Type 2 diabetes mellitus with diabetic chronic kidney disease: Secondary | ICD-10-CM | POA: Diagnosis not present

## 2020-10-09 DIAGNOSIS — N186 End stage renal disease: Secondary | ICD-10-CM | POA: Diagnosis not present

## 2020-10-09 DIAGNOSIS — D509 Iron deficiency anemia, unspecified: Secondary | ICD-10-CM | POA: Diagnosis not present

## 2020-10-09 DIAGNOSIS — Z992 Dependence on renal dialysis: Secondary | ICD-10-CM | POA: Diagnosis not present

## 2020-10-09 DIAGNOSIS — E039 Hypothyroidism, unspecified: Secondary | ICD-10-CM | POA: Diagnosis not present

## 2020-10-09 DIAGNOSIS — N2581 Secondary hyperparathyroidism of renal origin: Secondary | ICD-10-CM | POA: Diagnosis not present

## 2020-10-09 DIAGNOSIS — D689 Coagulation defect, unspecified: Secondary | ICD-10-CM | POA: Diagnosis not present

## 2020-10-09 DIAGNOSIS — D631 Anemia in chronic kidney disease: Secondary | ICD-10-CM | POA: Diagnosis not present

## 2020-10-11 DIAGNOSIS — E1122 Type 2 diabetes mellitus with diabetic chronic kidney disease: Secondary | ICD-10-CM | POA: Diagnosis not present

## 2020-10-11 DIAGNOSIS — Z992 Dependence on renal dialysis: Secondary | ICD-10-CM | POA: Diagnosis not present

## 2020-10-11 DIAGNOSIS — D689 Coagulation defect, unspecified: Secondary | ICD-10-CM | POA: Diagnosis not present

## 2020-10-11 DIAGNOSIS — D509 Iron deficiency anemia, unspecified: Secondary | ICD-10-CM | POA: Diagnosis not present

## 2020-10-11 DIAGNOSIS — E039 Hypothyroidism, unspecified: Secondary | ICD-10-CM | POA: Diagnosis not present

## 2020-10-11 DIAGNOSIS — N2581 Secondary hyperparathyroidism of renal origin: Secondary | ICD-10-CM | POA: Diagnosis not present

## 2020-10-11 DIAGNOSIS — D631 Anemia in chronic kidney disease: Secondary | ICD-10-CM | POA: Diagnosis not present

## 2020-10-11 DIAGNOSIS — N186 End stage renal disease: Secondary | ICD-10-CM | POA: Diagnosis not present

## 2020-10-13 DIAGNOSIS — Z7682 Awaiting organ transplant status: Principal | ICD-10-CM

## 2020-10-13 DIAGNOSIS — N186 End stage renal disease: Principal | ICD-10-CM

## 2020-10-13 DIAGNOSIS — Z01818 Encounter for other preprocedural examination: Principal | ICD-10-CM

## 2020-10-13 DIAGNOSIS — Z992 Dependence on renal dialysis: Secondary | ICD-10-CM | POA: Diagnosis not present

## 2020-10-13 DIAGNOSIS — D689 Coagulation defect, unspecified: Secondary | ICD-10-CM | POA: Diagnosis not present

## 2020-10-13 DIAGNOSIS — E039 Hypothyroidism, unspecified: Secondary | ICD-10-CM | POA: Diagnosis not present

## 2020-10-13 DIAGNOSIS — N2581 Secondary hyperparathyroidism of renal origin: Secondary | ICD-10-CM | POA: Diagnosis not present

## 2020-10-13 DIAGNOSIS — D509 Iron deficiency anemia, unspecified: Secondary | ICD-10-CM | POA: Diagnosis not present

## 2020-10-13 DIAGNOSIS — D631 Anemia in chronic kidney disease: Secondary | ICD-10-CM | POA: Diagnosis not present

## 2020-10-13 DIAGNOSIS — E1122 Type 2 diabetes mellitus with diabetic chronic kidney disease: Secondary | ICD-10-CM | POA: Diagnosis not present

## 2020-10-16 DIAGNOSIS — D509 Iron deficiency anemia, unspecified: Secondary | ICD-10-CM | POA: Diagnosis not present

## 2020-10-16 DIAGNOSIS — N186 End stage renal disease: Secondary | ICD-10-CM | POA: Diagnosis not present

## 2020-10-16 DIAGNOSIS — E1122 Type 2 diabetes mellitus with diabetic chronic kidney disease: Secondary | ICD-10-CM | POA: Diagnosis not present

## 2020-10-16 DIAGNOSIS — D631 Anemia in chronic kidney disease: Secondary | ICD-10-CM | POA: Diagnosis not present

## 2020-10-16 DIAGNOSIS — E039 Hypothyroidism, unspecified: Secondary | ICD-10-CM | POA: Diagnosis not present

## 2020-10-16 DIAGNOSIS — D689 Coagulation defect, unspecified: Secondary | ICD-10-CM | POA: Diagnosis not present

## 2020-10-16 DIAGNOSIS — N2581 Secondary hyperparathyroidism of renal origin: Secondary | ICD-10-CM | POA: Diagnosis not present

## 2020-10-16 DIAGNOSIS — Z992 Dependence on renal dialysis: Secondary | ICD-10-CM | POA: Diagnosis not present

## 2020-10-18 DIAGNOSIS — D631 Anemia in chronic kidney disease: Secondary | ICD-10-CM | POA: Diagnosis not present

## 2020-10-18 DIAGNOSIS — E039 Hypothyroidism, unspecified: Secondary | ICD-10-CM | POA: Diagnosis not present

## 2020-10-18 DIAGNOSIS — N2581 Secondary hyperparathyroidism of renal origin: Secondary | ICD-10-CM | POA: Diagnosis not present

## 2020-10-18 DIAGNOSIS — D689 Coagulation defect, unspecified: Secondary | ICD-10-CM | POA: Diagnosis not present

## 2020-10-18 DIAGNOSIS — D509 Iron deficiency anemia, unspecified: Secondary | ICD-10-CM | POA: Diagnosis not present

## 2020-10-18 DIAGNOSIS — Z992 Dependence on renal dialysis: Secondary | ICD-10-CM | POA: Diagnosis not present

## 2020-10-18 DIAGNOSIS — N186 End stage renal disease: Secondary | ICD-10-CM | POA: Diagnosis not present

## 2020-10-18 DIAGNOSIS — E1122 Type 2 diabetes mellitus with diabetic chronic kidney disease: Secondary | ICD-10-CM | POA: Diagnosis not present

## 2020-10-20 DIAGNOSIS — N186 End stage renal disease: Secondary | ICD-10-CM | POA: Diagnosis not present

## 2020-10-20 DIAGNOSIS — E1122 Type 2 diabetes mellitus with diabetic chronic kidney disease: Secondary | ICD-10-CM | POA: Diagnosis not present

## 2020-10-20 DIAGNOSIS — E039 Hypothyroidism, unspecified: Secondary | ICD-10-CM | POA: Diagnosis not present

## 2020-10-20 DIAGNOSIS — D689 Coagulation defect, unspecified: Secondary | ICD-10-CM | POA: Diagnosis not present

## 2020-10-20 DIAGNOSIS — N2581 Secondary hyperparathyroidism of renal origin: Secondary | ICD-10-CM | POA: Diagnosis not present

## 2020-10-20 DIAGNOSIS — D631 Anemia in chronic kidney disease: Secondary | ICD-10-CM | POA: Diagnosis not present

## 2020-10-20 DIAGNOSIS — D509 Iron deficiency anemia, unspecified: Secondary | ICD-10-CM | POA: Diagnosis not present

## 2020-10-20 DIAGNOSIS — Z992 Dependence on renal dialysis: Secondary | ICD-10-CM | POA: Diagnosis not present

## 2020-10-23 DIAGNOSIS — D689 Coagulation defect, unspecified: Secondary | ICD-10-CM | POA: Diagnosis not present

## 2020-10-23 DIAGNOSIS — E1122 Type 2 diabetes mellitus with diabetic chronic kidney disease: Secondary | ICD-10-CM | POA: Diagnosis not present

## 2020-10-23 DIAGNOSIS — N186 End stage renal disease: Secondary | ICD-10-CM | POA: Diagnosis not present

## 2020-10-23 DIAGNOSIS — Z992 Dependence on renal dialysis: Secondary | ICD-10-CM | POA: Diagnosis not present

## 2020-10-23 DIAGNOSIS — E039 Hypothyroidism, unspecified: Secondary | ICD-10-CM | POA: Diagnosis not present

## 2020-10-23 DIAGNOSIS — D509 Iron deficiency anemia, unspecified: Secondary | ICD-10-CM | POA: Diagnosis not present

## 2020-10-23 DIAGNOSIS — D631 Anemia in chronic kidney disease: Secondary | ICD-10-CM | POA: Diagnosis not present

## 2020-10-23 DIAGNOSIS — N2581 Secondary hyperparathyroidism of renal origin: Secondary | ICD-10-CM | POA: Diagnosis not present

## 2020-10-25 DIAGNOSIS — N2581 Secondary hyperparathyroidism of renal origin: Secondary | ICD-10-CM | POA: Diagnosis not present

## 2020-10-25 DIAGNOSIS — Z992 Dependence on renal dialysis: Secondary | ICD-10-CM | POA: Diagnosis not present

## 2020-10-25 DIAGNOSIS — D631 Anemia in chronic kidney disease: Secondary | ICD-10-CM | POA: Diagnosis not present

## 2020-10-25 DIAGNOSIS — E039 Hypothyroidism, unspecified: Secondary | ICD-10-CM | POA: Diagnosis not present

## 2020-10-25 DIAGNOSIS — D509 Iron deficiency anemia, unspecified: Secondary | ICD-10-CM | POA: Diagnosis not present

## 2020-10-25 DIAGNOSIS — E1122 Type 2 diabetes mellitus with diabetic chronic kidney disease: Secondary | ICD-10-CM | POA: Diagnosis not present

## 2020-10-25 DIAGNOSIS — D689 Coagulation defect, unspecified: Secondary | ICD-10-CM | POA: Diagnosis not present

## 2020-10-25 DIAGNOSIS — N186 End stage renal disease: Secondary | ICD-10-CM | POA: Diagnosis not present

## 2020-10-29 DIAGNOSIS — N186 End stage renal disease: Secondary | ICD-10-CM | POA: Diagnosis not present

## 2020-10-29 DIAGNOSIS — Z992 Dependence on renal dialysis: Secondary | ICD-10-CM | POA: Diagnosis not present

## 2020-10-29 DIAGNOSIS — E1122 Type 2 diabetes mellitus with diabetic chronic kidney disease: Secondary | ICD-10-CM | POA: Diagnosis not present

## 2020-10-30 DIAGNOSIS — D509 Iron deficiency anemia, unspecified: Secondary | ICD-10-CM | POA: Diagnosis not present

## 2020-10-30 DIAGNOSIS — N2581 Secondary hyperparathyroidism of renal origin: Secondary | ICD-10-CM | POA: Diagnosis not present

## 2020-10-30 DIAGNOSIS — D689 Coagulation defect, unspecified: Secondary | ICD-10-CM | POA: Diagnosis not present

## 2020-10-30 DIAGNOSIS — R52 Pain, unspecified: Secondary | ICD-10-CM | POA: Diagnosis not present

## 2020-10-30 DIAGNOSIS — Z992 Dependence on renal dialysis: Secondary | ICD-10-CM | POA: Diagnosis not present

## 2020-10-30 DIAGNOSIS — E039 Hypothyroidism, unspecified: Secondary | ICD-10-CM | POA: Diagnosis not present

## 2020-10-30 DIAGNOSIS — D631 Anemia in chronic kidney disease: Secondary | ICD-10-CM | POA: Diagnosis not present

## 2020-10-30 DIAGNOSIS — N186 End stage renal disease: Secondary | ICD-10-CM | POA: Diagnosis not present

## 2020-11-01 DIAGNOSIS — Z992 Dependence on renal dialysis: Secondary | ICD-10-CM | POA: Diagnosis not present

## 2020-11-01 DIAGNOSIS — R52 Pain, unspecified: Secondary | ICD-10-CM | POA: Diagnosis not present

## 2020-11-01 DIAGNOSIS — D509 Iron deficiency anemia, unspecified: Secondary | ICD-10-CM | POA: Diagnosis not present

## 2020-11-01 DIAGNOSIS — N2581 Secondary hyperparathyroidism of renal origin: Secondary | ICD-10-CM | POA: Diagnosis not present

## 2020-11-01 DIAGNOSIS — E039 Hypothyroidism, unspecified: Secondary | ICD-10-CM | POA: Diagnosis not present

## 2020-11-01 DIAGNOSIS — D631 Anemia in chronic kidney disease: Secondary | ICD-10-CM | POA: Diagnosis not present

## 2020-11-01 DIAGNOSIS — D689 Coagulation defect, unspecified: Secondary | ICD-10-CM | POA: Diagnosis not present

## 2020-11-01 DIAGNOSIS — N186 End stage renal disease: Secondary | ICD-10-CM | POA: Diagnosis not present

## 2020-11-03 DIAGNOSIS — N186 End stage renal disease: Secondary | ICD-10-CM | POA: Diagnosis not present

## 2020-11-03 DIAGNOSIS — E039 Hypothyroidism, unspecified: Secondary | ICD-10-CM | POA: Diagnosis not present

## 2020-11-03 DIAGNOSIS — Z992 Dependence on renal dialysis: Secondary | ICD-10-CM | POA: Diagnosis not present

## 2020-11-03 DIAGNOSIS — R52 Pain, unspecified: Secondary | ICD-10-CM | POA: Diagnosis not present

## 2020-11-03 DIAGNOSIS — D509 Iron deficiency anemia, unspecified: Secondary | ICD-10-CM | POA: Diagnosis not present

## 2020-11-03 DIAGNOSIS — D689 Coagulation defect, unspecified: Secondary | ICD-10-CM | POA: Diagnosis not present

## 2020-11-03 DIAGNOSIS — N2581 Secondary hyperparathyroidism of renal origin: Secondary | ICD-10-CM | POA: Diagnosis not present

## 2020-11-03 DIAGNOSIS — D631 Anemia in chronic kidney disease: Secondary | ICD-10-CM | POA: Diagnosis not present

## 2020-11-06 DIAGNOSIS — N2581 Secondary hyperparathyroidism of renal origin: Secondary | ICD-10-CM | POA: Diagnosis not present

## 2020-11-06 DIAGNOSIS — E039 Hypothyroidism, unspecified: Secondary | ICD-10-CM | POA: Diagnosis not present

## 2020-11-06 DIAGNOSIS — Z992 Dependence on renal dialysis: Secondary | ICD-10-CM | POA: Diagnosis not present

## 2020-11-06 DIAGNOSIS — D509 Iron deficiency anemia, unspecified: Secondary | ICD-10-CM | POA: Diagnosis not present

## 2020-11-06 DIAGNOSIS — N186 End stage renal disease: Secondary | ICD-10-CM | POA: Diagnosis not present

## 2020-11-06 DIAGNOSIS — R52 Pain, unspecified: Secondary | ICD-10-CM | POA: Diagnosis not present

## 2020-11-06 DIAGNOSIS — D689 Coagulation defect, unspecified: Secondary | ICD-10-CM | POA: Diagnosis not present

## 2020-11-06 DIAGNOSIS — D631 Anemia in chronic kidney disease: Secondary | ICD-10-CM | POA: Diagnosis not present

## 2020-11-08 DIAGNOSIS — R52 Pain, unspecified: Secondary | ICD-10-CM | POA: Diagnosis not present

## 2020-11-08 DIAGNOSIS — D509 Iron deficiency anemia, unspecified: Secondary | ICD-10-CM | POA: Diagnosis not present

## 2020-11-08 DIAGNOSIS — E039 Hypothyroidism, unspecified: Secondary | ICD-10-CM | POA: Diagnosis not present

## 2020-11-08 DIAGNOSIS — N186 End stage renal disease: Secondary | ICD-10-CM | POA: Diagnosis not present

## 2020-11-08 DIAGNOSIS — N2581 Secondary hyperparathyroidism of renal origin: Secondary | ICD-10-CM | POA: Diagnosis not present

## 2020-11-08 DIAGNOSIS — D631 Anemia in chronic kidney disease: Secondary | ICD-10-CM | POA: Diagnosis not present

## 2020-11-08 DIAGNOSIS — D689 Coagulation defect, unspecified: Secondary | ICD-10-CM | POA: Diagnosis not present

## 2020-11-08 DIAGNOSIS — Z992 Dependence on renal dialysis: Secondary | ICD-10-CM | POA: Diagnosis not present

## 2020-11-09 ENCOUNTER — Other Ambulatory Visit: Payer: Self-pay

## 2020-11-09 ENCOUNTER — Encounter: Payer: Self-pay | Admitting: Orthopedic Surgery

## 2020-11-09 ENCOUNTER — Ambulatory Visit (INDEPENDENT_AMBULATORY_CARE_PROVIDER_SITE_OTHER): Payer: Medicare Other | Admitting: Physician Assistant

## 2020-11-09 DIAGNOSIS — Z89511 Acquired absence of right leg below knee: Secondary | ICD-10-CM

## 2020-11-09 NOTE — Progress Notes (Signed)
Office Visit Note   Patient: Tim Becker           Date of Birth: 05/30/1965           MRN: [id number] Visit Date: 11/09/2020              Requested by: Leeroy Cha, MD [address],  [place] [address] PCP: Leeroy Cha, MD  Chief Complaint  Patient presents with   Right Leg - Follow-up    HX BKA       HPI: Patient is status post right below-knee amputation by another provider.  He is in need of new sleeves and liners.  He has had some loss of volume and he has noticed that the sleeves and liner slip down he has caused some pinching at the end of his amputation stump he also has a area of cracked skin.  This is been present for a while hoping to get carotid in the socket  Assessment & Plan: Visit Diagnoses: No diagnosis found.  Plan: Prescription was provided patient may follow-up as needed  Follow-Up Instructions: No follow-ups on file.   Ortho Exam  Patient is alert, oriented, no adenopathy, well-dressed, normal affect, normal respiratory effort. Examination demonstrates overall no evidence of infection no drainage she does have some skin cracking at the distal end of the stump.  There is no cellulitis no foul odor Patient is an existing right transtibial  amputee.  Patient's current comorbidities are not expected to impact the ability to function with the prescribed prosthesis. Patient verbally communicates a strong desire to use a prosthesis. Patient currently requires mobility aids to ambulate without a prosthesis.  Expects not to use mobility aids with a new prosthesis.  Patient is a K3 level ambulator that spends a lot of time walking around on uneven terrain over obstacles, up and down stairs, and ambulates with a variable cadence.    Imaging: No results found. No images are attached to the encounter.  Labs: Lab Results  Component Value Date   HGBA1C 8.6 (H) 04/16/2016   ESRSEDRATE 74 (H) 04/15/2016   ESRSEDRATE  127 (H) 08/10/2014   CRP 13.3 (H) 04/15/2016   CRP 8.9 (H) 08/10/2014   REPTSTATUS 04/24/2016 FINAL 04/16/2016   GRAMSTAIN  04/16/2016    RARE WBC PRESENT, PREDOMINANTLY PMN RARE GRAM NEGATIVE RODS    CULT  04/16/2016    FEW METHICILLIN RESISTANT STAPHYLOCOCCUS AUREUS FEW MORAXELLA SPECIES BETA LACTAMASE POSITIVE NO ANAEROBES ISOLATED    LABORGA METHICILLIN RESISTANT STAPHYLOCOCCUS AUREUS 04/16/2016     Lab Results  Component Value Date   ALBUMIN 3.2 (L) 04/17/2016   ALBUMIN 3.1 (L) 04/16/2016   ALBUMIN 3.0 (L) 04/15/2016    Lab Results  Component Value Date   MG 2.1 04/16/2016   No results found for: VD25OH  No results found for: PREALBUMIN CBC EXTENDED Latest Ref Rng & Units 12/15/2016 04/17/2016 04/16/2016  WBC 4.0 - 10.5 K/uL 8.2 8.6 7.4  RBC 4.22 - 5.81 MIL/uL 3.91(L) 3.79(L) 3.73(L)  HGB 13.0 - 17.0 g/dL 11.1(L) 10.3(L) 10.1(L)  HCT 39.0 - 52.0 % 36.9(L) 34.7(L) 34.2(L)  PLT 150 - 400 K/uL 265 257 239  NEUTROABS 1.7 - 7.7 K/uL - - -  LYMPHSABS 0.7 - 4.0 K/uL - - -     There is no height or weight on file to calculate BMI.  Orders:  No orders of the defined types were placed in this encounter.  No orders of the defined types  were placed in this encounter.    Procedures: No procedures performed  Clinical Data: No additional findings.  ROS:  All other systems negative, except as noted in the HPI. Review of Systems  Objective: Vital Signs: There were no vitals taken for this visit.  Specialty Comments:  No specialty comments available.  PMFS History: Patient Active Problem List   Diagnosis Date Noted   Severe nonproliferative diabetic retinopathy of right eye ([place]) 09/12/2020   Severe nonproliferative diabetic retinopathy of left eye ([place]) 09/12/2020   Nuclear sclerotic cataract of both eyes 09/12/2020   Charcot's joint, left ankle and foot 10/29/2016   History of right below knee amputation ([place]) 10/29/2016   Diabetic polyneuropathy  associated with type 2 diabetes mellitus ([place]) 10/29/2016   ESRD (end stage renal disease) on dialysis ([place]) 04/15/2016   Chronic osteomyelitis of toe of left foot ([place]) 04/15/2016   Left renal mass 07/11/2015   Biliary calculi 09/14/2014   Hemodialysis-associated hypotension 09/13/2014   H/O amputation of leg through tibia and fibula ([place]) 09/08/2014   Anemia associated with chronic renal failure 09/06/2014   Type 2 diabetes mellitus with diabetic neuropathy ([place]) 08/07/2014   Essential hypertension 08/07/2014   Obesity 08/07/2014   Hypothyroidism 08/07/2014   Kidney lump 04/06/2014   Obstructive apnea 04/06/2014   Diabetes mellitus ([place]) 03/23/2014   Patient awaiting renal transplant 03/23/2014   Asthma, moderate persistent 10/28/2013   Anemia due to blood loss 09/14/2013   Morbid obesity ([place]) 09/11/2013   Morbid (severe) obesity due to excess calories ([place]) 09/11/2013   Chronic kidney disease, stage IV (severe) ([place]) 09/10/2013   Type 2 diabetes mellitus ([place]) 09/10/2013   Mild persistent asthma with acute exacerbation 09/10/2013   Abnormal ECG 05/07/2013   CAD in native artery 05/07/2013   Past Medical History:  Diagnosis Date   Anemia    Arthritis    Cancer ([place])    Renal Tumor   CKD (chronic kidney disease)    Coronary artery disease    Diabetes ([place])    DM (diabetes mellitus) with complications ([place])    ESRD (end stage renal disease) ([place])    Pre- dialysis   Hypertension    Left kidney mass    Neuropathy    Obesity    Osteomyelitis, chronic, ankle or foot (HCC)    PONV (postoperative nausea and vomiting)    Renal insufficiency    Patient is on Dialysis and receives M,W and F.   S/P BKA (below knee amputation) [facility]) June 2016   Right , [place], [place]   Sleep apnea    CPAP   Thyroid disease     Family History  Problem Relation Age of Onset   Diabetes Mother    Kidney disease Mother    Breast cancer Mother     Past Surgical History:  Procedure  Laterality Date   BASCILIC VEIN TRANSPOSITION Left 02/17/2015   Procedure: BASCILIC VEIN TRANSPOSITION;  Surgeon: Katha Cabal, MD;  Location: [facility];  Service: Vascular;  Laterality: Left;   BELOW KNEE LEG AMPUTATION Right    CORONARY ANGIOPLASTY WITH STENT PLACEMENT     EYE SURGERY     FOOT SURGERY     Multiple R foot surgery for infection and charcot foot   I & D EXTREMITY Left 04/16/2016   Procedure: IRRIGATION AND DEBRIDEMENT EXTREMITY/GREAT TOE AMP.;  Surgeon: Edrick Kins, DPM;  Location: [place];  Service: Podiatry;  Laterality: Left;   JOINT REPLACEMENT  LAPAROSCOPIC NEPHRECTOMY, HAND ASSISTED Left 07/11/2015   Procedure: HAND ASSISTED LAPAROSCOPIC NEPHRECTOMY;  Surgeon: Hollice Espy, MD;  Location: [facility];  Service: Urology;  Laterality: Left;   PERIPHERAL VASCULAR CATHETERIZATION Left 12/06/2014   Procedure: A/V Shuntogram/Fistulagram;  Surgeon: Katha Cabal, MD;  Location: [place] [facility];  Service: Cardiovascular;  Laterality: Left;   PERIPHERAL VASCULAR CATHETERIZATION Left 12/06/2014   Procedure: A/V Shunt Intervention;  Surgeon: Katha Cabal, MD;  Location: [place] [facility];  Service: Cardiovascular;  Laterality: Left;   TONSILLECTOMY     Social History   Occupational History   Not on file  Tobacco Use   Smoking status: Never   Smokeless tobacco: Never  Vaping Use   Vaping Use: Never used  Substance and Sexual Activity   Alcohol use: No   Drug use: No   Sexual activity: Yes

## 2020-11-10 DIAGNOSIS — D689 Coagulation defect, unspecified: Secondary | ICD-10-CM | POA: Diagnosis not present

## 2020-11-10 DIAGNOSIS — D631 Anemia in chronic kidney disease: Secondary | ICD-10-CM | POA: Diagnosis not present

## 2020-11-10 DIAGNOSIS — R52 Pain, unspecified: Secondary | ICD-10-CM | POA: Diagnosis not present

## 2020-11-10 DIAGNOSIS — N2581 Secondary hyperparathyroidism of renal origin: Secondary | ICD-10-CM | POA: Diagnosis not present

## 2020-11-10 DIAGNOSIS — N186 End stage renal disease: Secondary | ICD-10-CM | POA: Diagnosis not present

## 2020-11-10 DIAGNOSIS — E039 Hypothyroidism, unspecified: Secondary | ICD-10-CM | POA: Diagnosis not present

## 2020-11-10 DIAGNOSIS — Z992 Dependence on renal dialysis: Secondary | ICD-10-CM | POA: Diagnosis not present

## 2020-11-10 DIAGNOSIS — D509 Iron deficiency anemia, unspecified: Secondary | ICD-10-CM | POA: Diagnosis not present

## 2020-11-13 DIAGNOSIS — Z992 Dependence on renal dialysis: Secondary | ICD-10-CM | POA: Diagnosis not present

## 2020-11-13 DIAGNOSIS — D631 Anemia in chronic kidney disease: Secondary | ICD-10-CM | POA: Diagnosis not present

## 2020-11-13 DIAGNOSIS — E039 Hypothyroidism, unspecified: Secondary | ICD-10-CM | POA: Diagnosis not present

## 2020-11-13 DIAGNOSIS — D689 Coagulation defect, unspecified: Secondary | ICD-10-CM | POA: Diagnosis not present

## 2020-11-13 DIAGNOSIS — R52 Pain, unspecified: Secondary | ICD-10-CM | POA: Diagnosis not present

## 2020-11-13 DIAGNOSIS — N186 End stage renal disease: Secondary | ICD-10-CM | POA: Diagnosis not present

## 2020-11-13 DIAGNOSIS — N2581 Secondary hyperparathyroidism of renal origin: Secondary | ICD-10-CM | POA: Diagnosis not present

## 2020-11-13 DIAGNOSIS — D509 Iron deficiency anemia, unspecified: Secondary | ICD-10-CM | POA: Diagnosis not present

## 2020-11-14 DIAGNOSIS — T82858A Stenosis of vascular prosthetic devices, implants and grafts, initial encounter: Secondary | ICD-10-CM | POA: Diagnosis not present

## 2020-11-14 DIAGNOSIS — Z992 Dependence on renal dialysis: Secondary | ICD-10-CM | POA: Diagnosis not present

## 2020-11-14 DIAGNOSIS — I871 Compression of vein: Secondary | ICD-10-CM | POA: Diagnosis not present

## 2020-11-14 DIAGNOSIS — N186 End stage renal disease: Secondary | ICD-10-CM | POA: Diagnosis not present

## 2020-11-15 DIAGNOSIS — Z125 Encounter for screening for malignant neoplasm of prostate: Principal | ICD-10-CM

## 2020-11-15 DIAGNOSIS — Z01810 Encounter for preprocedural cardiovascular examination: Principal | ICD-10-CM

## 2020-11-15 DIAGNOSIS — Z85528 Personal history of other malignant neoplasm of kidney: Principal | ICD-10-CM

## 2020-11-15 DIAGNOSIS — Z7289 Other problems related to lifestyle: Principal | ICD-10-CM

## 2020-11-15 DIAGNOSIS — R911 Solitary pulmonary nodule: Principal | ICD-10-CM

## 2020-11-15 DIAGNOSIS — Z7682 Awaiting organ transplant status: Principal | ICD-10-CM

## 2020-11-15 DIAGNOSIS — Z992 Dependence on renal dialysis: Principal | ICD-10-CM

## 2020-11-15 DIAGNOSIS — E1129 Type 2 diabetes mellitus with other diabetic kidney complication: Principal | ICD-10-CM

## 2020-11-15 DIAGNOSIS — N186 End stage renal disease: Principal | ICD-10-CM

## 2020-11-15 DIAGNOSIS — E039 Hypothyroidism, unspecified: Secondary | ICD-10-CM | POA: Diagnosis not present

## 2020-11-15 DIAGNOSIS — R52 Pain, unspecified: Secondary | ICD-10-CM | POA: Diagnosis not present

## 2020-11-15 DIAGNOSIS — D689 Coagulation defect, unspecified: Secondary | ICD-10-CM | POA: Diagnosis not present

## 2020-11-15 DIAGNOSIS — D509 Iron deficiency anemia, unspecified: Secondary | ICD-10-CM | POA: Diagnosis not present

## 2020-11-15 DIAGNOSIS — D631 Anemia in chronic kidney disease: Secondary | ICD-10-CM | POA: Diagnosis not present

## 2020-11-15 DIAGNOSIS — N2581 Secondary hyperparathyroidism of renal origin: Secondary | ICD-10-CM | POA: Diagnosis not present

## 2020-11-16 ENCOUNTER — Telehealth: Payer: Self-pay

## 2020-11-16 NOTE — Telephone Encounter (Signed)
Can you please call pt and make an appt for eval if hurting needs OV

## 2020-11-16 NOTE — Telephone Encounter (Signed)
Hasnt had any surgery by [place] or in awhile. If he hurts a lot needs to come.

## 2020-11-16 NOTE — Telephone Encounter (Signed)
Pt would like to know if he can have some pain medication sent in. He is having pain in his stump more than usual.  Patient states its not phantom pain its regular pain.   Please advise

## 2020-11-16 NOTE — Telephone Encounter (Signed)
Please see below.

## 2020-11-17 DIAGNOSIS — E039 Hypothyroidism, unspecified: Secondary | ICD-10-CM | POA: Diagnosis not present

## 2020-11-17 DIAGNOSIS — N186 End stage renal disease: Secondary | ICD-10-CM | POA: Diagnosis not present

## 2020-11-17 DIAGNOSIS — D689 Coagulation defect, unspecified: Secondary | ICD-10-CM | POA: Diagnosis not present

## 2020-11-17 DIAGNOSIS — D631 Anemia in chronic kidney disease: Secondary | ICD-10-CM | POA: Diagnosis not present

## 2020-11-17 DIAGNOSIS — N2581 Secondary hyperparathyroidism of renal origin: Secondary | ICD-10-CM | POA: Diagnosis not present

## 2020-11-17 DIAGNOSIS — Z992 Dependence on renal dialysis: Secondary | ICD-10-CM | POA: Diagnosis not present

## 2020-11-17 DIAGNOSIS — R52 Pain, unspecified: Secondary | ICD-10-CM | POA: Diagnosis not present

## 2020-11-17 DIAGNOSIS — D509 Iron deficiency anemia, unspecified: Secondary | ICD-10-CM | POA: Diagnosis not present

## 2020-11-20 DIAGNOSIS — D509 Iron deficiency anemia, unspecified: Secondary | ICD-10-CM | POA: Diagnosis not present

## 2020-11-20 DIAGNOSIS — E039 Hypothyroidism, unspecified: Secondary | ICD-10-CM | POA: Diagnosis not present

## 2020-11-20 DIAGNOSIS — N186 End stage renal disease: Secondary | ICD-10-CM | POA: Diagnosis not present

## 2020-11-20 DIAGNOSIS — R52 Pain, unspecified: Secondary | ICD-10-CM | POA: Diagnosis not present

## 2020-11-20 DIAGNOSIS — D631 Anemia in chronic kidney disease: Secondary | ICD-10-CM | POA: Diagnosis not present

## 2020-11-20 DIAGNOSIS — D689 Coagulation defect, unspecified: Secondary | ICD-10-CM | POA: Diagnosis not present

## 2020-11-20 DIAGNOSIS — Z992 Dependence on renal dialysis: Secondary | ICD-10-CM | POA: Diagnosis not present

## 2020-11-20 DIAGNOSIS — N2581 Secondary hyperparathyroidism of renal origin: Secondary | ICD-10-CM | POA: Diagnosis not present

## 2020-11-20 NOTE — Telephone Encounter (Signed)
Appt made for tomorrow with Junie Panning.

## 2020-11-21 ENCOUNTER — Encounter: Payer: Self-pay | Admitting: Family

## 2020-11-21 ENCOUNTER — Ambulatory Visit: Payer: Medicare Other | Admitting: Orthopedic Surgery

## 2020-11-21 ENCOUNTER — Ambulatory Visit (INDEPENDENT_AMBULATORY_CARE_PROVIDER_SITE_OTHER): Payer: Medicare Other | Admitting: Family

## 2020-11-21 DIAGNOSIS — L97221 Non-pressure chronic ulcer of left calf limited to breakdown of skin: Secondary | ICD-10-CM | POA: Diagnosis not present

## 2020-11-21 DIAGNOSIS — Z89511 Acquired absence of right leg below knee: Secondary | ICD-10-CM

## 2020-11-21 MED ORDER — OXYCODONE-ACETAMINOPHEN 5-325 MG PO TABS: 1.0000 | ORAL_TABLET | Freq: Two times a day (BID) | ORAL | 0 refills | Status: DC | PRN

## 2020-11-21 NOTE — Progress Notes (Signed)
Office Visit Note   Patient: Tim Becker           Date of Birth: 1966/03/16           MRN: [id number] Visit Date: 11/21/2020              Requested by: Leeroy Cha, MD [address],  [place] [address] PCP: Leeroy Cha, MD  Chief Complaint  Patient presents with   Right Knee - Follow-up      HPI: The patient is a 55 year old gentleman who is seen today in follow-up for ulcerative area to his right residual limb.  He is status post right below-knee amputation remote.  He has been having an issue now for over 1 year with callused ulceration to the tibial tubercle from an bearing.  He has been following with his prosthetists.  They have recommended a new socket.  At some point he was offered surgical debridement of the area he has wanted to hold off on socket replacement in case he did proceed with surgical intervention.  States he will be meeting with his prosthetists later today.  Of late he has been removing the prosthesis for sleep.  He has been letting the ulcerative area get air.  He has noticed that this has been finally starting to heal  Assessment & Plan: Visit Diagnoses: No diagnosis found.  Plan: May use mupirocin dressing at night.  During the day he will remove this and use the IV dressings underneath his liner and socket. Discussed proper seating with new socket fabrication.    He may return Wednesday nonviable tissue returns.  There is value in debriding the callus.  No surgical intervention at this time.    Follow-Up Instructions: No follow-ups on file.   Ortho Exam  Patient is alert, oriented, no adenopathy, well-dressed, normal affect, normal respiratory effort. On examination of the right residual limb overall this is well consolidated he does have some anterior callused ulceration over the tibial tubercle from an bearing.  This callused area is darkened and discolored is about 3 cm in diameter there is fissure  centrally.  The callused nonviable tissue was debrided with a 10 blade but to viable tissue.  There is superficial ulceration in the fissure shape.  There is no probing no drainage no surrounding erythema no odor  Imaging: No results found. No images are attached to the encounter.  Labs: Lab Results  Component Value Date   HGBA1C 8.6 (H) 04/16/2016   ESRSEDRATE 74 (H) 04/15/2016   ESRSEDRATE 127 (H) 08/10/2014   CRP 13.3 (H) 04/15/2016   CRP 8.9 (H) 08/10/2014   REPTSTATUS 04/24/2016 FINAL 04/16/2016   GRAMSTAIN  04/16/2016    RARE WBC PRESENT, PREDOMINANTLY PMN RARE GRAM NEGATIVE RODS    CULT  04/16/2016    FEW METHICILLIN RESISTANT STAPHYLOCOCCUS AUREUS FEW MORAXELLA SPECIES BETA LACTAMASE POSITIVE NO ANAEROBES ISOLATED    LABORGA METHICILLIN RESISTANT STAPHYLOCOCCUS AUREUS 04/16/2016     Lab Results  Component Value Date   ALBUMIN 3.2 (L) 04/17/2016   ALBUMIN 3.1 (L) 04/16/2016   ALBUMIN 3.0 (L) 04/15/2016    Lab Results  Component Value Date   MG 2.1 04/16/2016   No results found for: VD25OH  No results found for: PREALBUMIN CBC EXTENDED Latest Ref Rng & Units 12/15/2016 04/17/2016 04/16/2016  WBC 4.0 - 10.5 K/uL 8.2 8.6 7.4  RBC 4.22 - 5.81 MIL/uL 3.91(L) 3.79(L) 3.73(L)  HGB 13.0 - 17.0 g/dL 11.1(L) 10.3(L) 10.1(L)  HCT 39.0 - 52.0 % 36.9(L) 34.7(L) 34.2(L)  PLT 150 - 400 K/uL 265 257 239  NEUTROABS 1.7 - 7.7 K/uL - - -  LYMPHSABS 0.7 - 4.0 K/uL - - -     There is no height or weight on file to calculate BMI.  Orders:  No orders of the defined types were placed in this encounter.  Meds ordered this encounter  Medications   oxyCODONE-acetaminophen (PERCOCET) 5-325 MG tablet    Sig: Take 1 tablet by mouth 2 (two) times daily as needed for severe pain.    Dispense:  20 tablet    Refill:  0     Procedures: No procedures performed  Clinical Data: No additional findings.  ROS:  All other systems negative, except as noted in the HPI. Review of  Systems  Constitutional:  Negative for chills and fever.  Skin:  Positive for color change and wound.   Objective: Vital Signs: There were no vitals taken for this visit.  Specialty Comments:  No specialty comments available.  PMFS History: Patient Active Problem List   Diagnosis Date Noted   Severe nonproliferative diabetic retinopathy of right eye ([place]) 09/12/2020   Severe nonproliferative diabetic retinopathy of left eye ([place]) 09/12/2020   Nuclear sclerotic cataract of both eyes 09/12/2020   Charcot's joint, left ankle and foot 10/29/2016   History of right below knee amputation ([place]) 10/29/2016   Diabetic polyneuropathy associated with type 2 diabetes mellitus ([place]) 10/29/2016   ESRD (end stage renal disease) on dialysis ([place]) 04/15/2016   Chronic osteomyelitis of toe of left foot ([place]) 04/15/2016   Left renal mass 07/11/2015   Biliary calculi 09/14/2014   Hemodialysis-associated hypotension 09/13/2014   H/O amputation of leg through tibia and fibula ([place]) 09/08/2014   Anemia associated with chronic renal failure 09/06/2014   Type 2 diabetes mellitus with diabetic neuropathy ([place]) 08/07/2014   Essential hypertension 08/07/2014   Obesity 08/07/2014   Hypothyroidism 08/07/2014   Kidney lump 04/06/2014   Obstructive apnea 04/06/2014   Diabetes mellitus ([place]) 03/23/2014   Patient awaiting renal transplant 03/23/2014   Asthma, moderate persistent 10/28/2013   Anemia due to blood loss 09/14/2013   Morbid obesity ([place]) 09/11/2013   Morbid (severe) obesity due to excess calories ([place]) 09/11/2013   Chronic kidney disease, stage IV (severe) ([place]) 09/10/2013   Type 2 diabetes mellitus ([place]) 09/10/2013   Mild persistent asthma with acute exacerbation 09/10/2013   Abnormal ECG 05/07/2013   CAD in native artery 05/07/2013   Past Medical History:  Diagnosis Date   Anemia    Arthritis    Cancer ([place])    Renal Tumor   CKD (chronic kidney disease)    Coronary artery disease     Diabetes ([place])    DM (diabetes mellitus) with complications ([place])    ESRD (end stage renal disease) ([place])    Pre- dialysis   Hypertension    Left kidney mass    Neuropathy    Obesity    Osteomyelitis, chronic, ankle or foot (HCC)    PONV (postoperative nausea and vomiting)    Renal insufficiency    Patient is on Dialysis and receives M,W and F.   S/P BKA (below knee amputation) [facility]) June 2016   Right , [place], [place]   Sleep apnea    CPAP   Thyroid disease     Family History  Problem Relation Age of Onset   Diabetes Mother    Kidney disease Mother    Breast  cancer Mother     Past Surgical History:  Procedure Laterality Date   BASCILIC VEIN TRANSPOSITION Left 02/17/2015   Procedure: BASCILIC VEIN TRANSPOSITION;  Surgeon: Katha Cabal, MD;  Location: [facility];  Service: Vascular;  Laterality: Left;   BELOW KNEE LEG AMPUTATION Right    CORONARY ANGIOPLASTY WITH STENT PLACEMENT     EYE SURGERY     FOOT SURGERY     Multiple R foot surgery for infection and charcot foot   I & D EXTREMITY Left 04/16/2016   Procedure: IRRIGATION AND DEBRIDEMENT EXTREMITY/GREAT TOE AMP.;  Surgeon: Edrick Kins, DPM;  Location: [place];  Service: Podiatry;  Laterality: Left;   JOINT REPLACEMENT     LAPAROSCOPIC NEPHRECTOMY, HAND ASSISTED Left 07/11/2015   Procedure: HAND ASSISTED LAPAROSCOPIC NEPHRECTOMY;  Surgeon: Hollice Espy, MD;  Location: [facility];  Service: Urology;  Laterality: Left;   PERIPHERAL VASCULAR CATHETERIZATION Left 12/06/2014   Procedure: A/V Shuntogram/Fistulagram;  Surgeon: Katha Cabal, MD;  Location: [place] [facility];  Service: Cardiovascular;  Laterality: Left;   PERIPHERAL VASCULAR CATHETERIZATION Left 12/06/2014   Procedure: A/V Shunt Intervention;  Surgeon: Katha Cabal, MD;  Location: [place] [facility];  Service: Cardiovascular;  Laterality: Left;   TONSILLECTOMY     Social History   Occupational History   Not on file  Tobacco Use   Smoking  status: Never   Smokeless tobacco: Never  Vaping Use   Vaping Use: Never used  Substance and Sexual Activity   Alcohol use: No   Drug use: No   Sexual activity: Yes

## 2020-11-22 DIAGNOSIS — N2581 Secondary hyperparathyroidism of renal origin: Secondary | ICD-10-CM | POA: Diagnosis not present

## 2020-11-22 DIAGNOSIS — D689 Coagulation defect, unspecified: Secondary | ICD-10-CM | POA: Diagnosis not present

## 2020-11-22 DIAGNOSIS — D509 Iron deficiency anemia, unspecified: Secondary | ICD-10-CM | POA: Diagnosis not present

## 2020-11-22 DIAGNOSIS — E039 Hypothyroidism, unspecified: Secondary | ICD-10-CM | POA: Diagnosis not present

## 2020-11-22 DIAGNOSIS — N186 End stage renal disease: Secondary | ICD-10-CM | POA: Diagnosis not present

## 2020-11-22 DIAGNOSIS — R52 Pain, unspecified: Secondary | ICD-10-CM | POA: Diagnosis not present

## 2020-11-22 DIAGNOSIS — D631 Anemia in chronic kidney disease: Secondary | ICD-10-CM | POA: Diagnosis not present

## 2020-11-22 DIAGNOSIS — Z992 Dependence on renal dialysis: Secondary | ICD-10-CM | POA: Diagnosis not present

## 2020-11-23 DIAGNOSIS — Z0000 Encounter for general adult medical examination without abnormal findings: Secondary | ICD-10-CM | POA: Diagnosis not present

## 2020-11-23 DIAGNOSIS — E039 Hypothyroidism, unspecified: Secondary | ICD-10-CM | POA: Diagnosis not present

## 2020-11-23 DIAGNOSIS — Z1389 Encounter for screening for other disorder: Secondary | ICD-10-CM | POA: Diagnosis not present

## 2020-11-23 DIAGNOSIS — I10 Essential (primary) hypertension: Secondary | ICD-10-CM | POA: Diagnosis not present

## 2020-11-23 DIAGNOSIS — E1122 Type 2 diabetes mellitus with diabetic chronic kidney disease: Secondary | ICD-10-CM | POA: Diagnosis not present

## 2020-11-23 DIAGNOSIS — E782 Mixed hyperlipidemia: Secondary | ICD-10-CM | POA: Diagnosis not present

## 2020-11-24 DIAGNOSIS — D509 Iron deficiency anemia, unspecified: Secondary | ICD-10-CM | POA: Diagnosis not present

## 2020-11-24 DIAGNOSIS — Z992 Dependence on renal dialysis: Secondary | ICD-10-CM | POA: Diagnosis not present

## 2020-11-24 DIAGNOSIS — R52 Pain, unspecified: Secondary | ICD-10-CM | POA: Diagnosis not present

## 2020-11-24 DIAGNOSIS — D689 Coagulation defect, unspecified: Secondary | ICD-10-CM | POA: Diagnosis not present

## 2020-11-24 DIAGNOSIS — D631 Anemia in chronic kidney disease: Secondary | ICD-10-CM | POA: Diagnosis not present

## 2020-11-24 DIAGNOSIS — N186 End stage renal disease: Secondary | ICD-10-CM | POA: Diagnosis not present

## 2020-11-24 DIAGNOSIS — E039 Hypothyroidism, unspecified: Secondary | ICD-10-CM | POA: Diagnosis not present

## 2020-11-24 DIAGNOSIS — N2581 Secondary hyperparathyroidism of renal origin: Secondary | ICD-10-CM | POA: Diagnosis not present

## 2020-11-27 DIAGNOSIS — D689 Coagulation defect, unspecified: Secondary | ICD-10-CM | POA: Diagnosis not present

## 2020-11-27 DIAGNOSIS — N2581 Secondary hyperparathyroidism of renal origin: Secondary | ICD-10-CM | POA: Diagnosis not present

## 2020-11-27 DIAGNOSIS — D631 Anemia in chronic kidney disease: Secondary | ICD-10-CM | POA: Diagnosis not present

## 2020-11-27 DIAGNOSIS — R52 Pain, unspecified: Secondary | ICD-10-CM | POA: Diagnosis not present

## 2020-11-27 DIAGNOSIS — D509 Iron deficiency anemia, unspecified: Secondary | ICD-10-CM | POA: Diagnosis not present

## 2020-11-27 DIAGNOSIS — N186 End stage renal disease: Secondary | ICD-10-CM | POA: Diagnosis not present

## 2020-11-27 DIAGNOSIS — Z992 Dependence on renal dialysis: Secondary | ICD-10-CM | POA: Diagnosis not present

## 2020-11-27 DIAGNOSIS — E039 Hypothyroidism, unspecified: Secondary | ICD-10-CM | POA: Diagnosis not present

## 2020-11-29 DIAGNOSIS — E039 Hypothyroidism, unspecified: Secondary | ICD-10-CM | POA: Diagnosis not present

## 2020-11-29 DIAGNOSIS — D631 Anemia in chronic kidney disease: Secondary | ICD-10-CM | POA: Diagnosis not present

## 2020-11-29 DIAGNOSIS — N186 End stage renal disease: Secondary | ICD-10-CM | POA: Diagnosis not present

## 2020-11-29 DIAGNOSIS — D689 Coagulation defect, unspecified: Secondary | ICD-10-CM | POA: Diagnosis not present

## 2020-11-29 DIAGNOSIS — R52 Pain, unspecified: Secondary | ICD-10-CM | POA: Diagnosis not present

## 2020-11-29 DIAGNOSIS — E1122 Type 2 diabetes mellitus with diabetic chronic kidney disease: Secondary | ICD-10-CM | POA: Diagnosis not present

## 2020-11-29 DIAGNOSIS — Z992 Dependence on renal dialysis: Secondary | ICD-10-CM | POA: Diagnosis not present

## 2020-11-29 DIAGNOSIS — D509 Iron deficiency anemia, unspecified: Secondary | ICD-10-CM | POA: Diagnosis not present

## 2020-11-29 DIAGNOSIS — N2581 Secondary hyperparathyroidism of renal origin: Secondary | ICD-10-CM | POA: Diagnosis not present

## 2020-12-01 DIAGNOSIS — D689 Coagulation defect, unspecified: Secondary | ICD-10-CM | POA: Diagnosis not present

## 2020-12-01 DIAGNOSIS — N2581 Secondary hyperparathyroidism of renal origin: Secondary | ICD-10-CM | POA: Diagnosis not present

## 2020-12-01 DIAGNOSIS — N186 End stage renal disease: Secondary | ICD-10-CM | POA: Diagnosis not present

## 2020-12-01 DIAGNOSIS — Z992 Dependence on renal dialysis: Secondary | ICD-10-CM | POA: Diagnosis not present

## 2020-12-03 ENCOUNTER — Encounter: Admit: 2020-12-03 | Discharge: 2020-12-03 | Payer: MEDICARE | Attending: Surgery | Primary: Surgery

## 2020-12-03 DIAGNOSIS — Z7682 Awaiting organ transplant status: Principal | ICD-10-CM

## 2020-12-03 DIAGNOSIS — I499 Cardiac arrhythmia, unspecified: Secondary | ICD-10-CM | POA: Diagnosis not present

## 2020-12-03 DIAGNOSIS — N179 Acute kidney failure, unspecified: Secondary | ICD-10-CM | POA: Diagnosis not present

## 2020-12-03 DIAGNOSIS — E1151 Type 2 diabetes mellitus with diabetic peripheral angiopathy without gangrene: Secondary | ICD-10-CM | POA: Diagnosis not present

## 2020-12-03 DIAGNOSIS — I517 Cardiomegaly: Secondary | ICD-10-CM | POA: Diagnosis not present

## 2020-12-03 DIAGNOSIS — Z85528 Personal history of other malignant neoplasm of kidney: Secondary | ICD-10-CM | POA: Diagnosis not present

## 2020-12-03 DIAGNOSIS — Z992 Dependence on renal dialysis: Secondary | ICD-10-CM | POA: Diagnosis not present

## 2020-12-03 DIAGNOSIS — Z940 Kidney transplant status: Secondary | ICD-10-CM | POA: Diagnosis not present

## 2020-12-03 DIAGNOSIS — I10 Essential (primary) hypertension: Secondary | ICD-10-CM | POA: Diagnosis not present

## 2020-12-03 DIAGNOSIS — E11319 Type 2 diabetes mellitus with unspecified diabetic retinopathy without macular edema: Secondary | ICD-10-CM | POA: Diagnosis not present

## 2020-12-03 DIAGNOSIS — D849 Immunodeficiency, unspecified: Secondary | ICD-10-CM | POA: Diagnosis not present

## 2020-12-03 DIAGNOSIS — Z5181 Encounter for therapeutic drug level monitoring: Secondary | ICD-10-CM | POA: Diagnosis not present

## 2020-12-03 DIAGNOSIS — Z794 Long term (current) use of insulin: Secondary | ICD-10-CM | POA: Diagnosis not present

## 2020-12-03 DIAGNOSIS — N186 End stage renal disease: Secondary | ICD-10-CM | POA: Diagnosis not present

## 2020-12-03 DIAGNOSIS — Z905 Acquired absence of kidney: Secondary | ICD-10-CM | POA: Diagnosis not present

## 2020-12-03 DIAGNOSIS — Z79899 Other long term (current) drug therapy: Secondary | ICD-10-CM | POA: Diagnosis not present

## 2020-12-03 DIAGNOSIS — Z20822 Contact with and (suspected) exposure to covid-19: Secondary | ICD-10-CM | POA: Diagnosis not present

## 2020-12-03 DIAGNOSIS — R638 Other symptoms and signs concerning food and fluid intake: Secondary | ICD-10-CM | POA: Diagnosis not present

## 2020-12-03 DIAGNOSIS — T380X5A Adverse effect of glucocorticoids and synthetic analogues, initial encounter: Secondary | ICD-10-CM | POA: Diagnosis not present

## 2020-12-03 DIAGNOSIS — E1121 Type 2 diabetes mellitus with diabetic nephropathy: Secondary | ICD-10-CM | POA: Diagnosis not present

## 2020-12-03 DIAGNOSIS — R918 Other nonspecific abnormal finding of lung field: Secondary | ICD-10-CM | POA: Diagnosis not present

## 2020-12-03 DIAGNOSIS — Z01818 Encounter for other preprocedural examination: Secondary | ICD-10-CM | POA: Diagnosis not present

## 2020-12-03 DIAGNOSIS — I2510 Atherosclerotic heart disease of native coronary artery without angina pectoris: Secondary | ICD-10-CM | POA: Diagnosis not present

## 2020-12-03 DIAGNOSIS — E1122 Type 2 diabetes mellitus with diabetic chronic kidney disease: Secondary | ICD-10-CM | POA: Diagnosis not present

## 2020-12-03 DIAGNOSIS — I120 Hypertensive chronic kidney disease with stage 5 chronic kidney disease or end stage renal disease: Secondary | ICD-10-CM | POA: Diagnosis not present

## 2020-12-03 DIAGNOSIS — I132 Hypertensive heart and chronic kidney disease with heart failure and with stage 5 chronic kidney disease, or end stage renal disease: Secondary | ICD-10-CM | POA: Diagnosis not present

## 2020-12-03 DIAGNOSIS — I5032 Chronic diastolic (congestive) heart failure: Secondary | ICD-10-CM | POA: Diagnosis not present

## 2020-12-03 DIAGNOSIS — E1142 Type 2 diabetes mellitus with diabetic polyneuropathy: Secondary | ICD-10-CM | POA: Diagnosis not present

## 2020-12-03 DIAGNOSIS — E119 Type 2 diabetes mellitus without complications: Secondary | ICD-10-CM | POA: Diagnosis not present

## 2020-12-03 DIAGNOSIS — E1165 Type 2 diabetes mellitus with hyperglycemia: Secondary | ICD-10-CM | POA: Diagnosis not present

## 2020-12-03 LAB — HIV ANTIBODY (ROUTINE TESTING W REFLEX): HIV 1&2 Ab, 4th Generation: NONREACTIVE

## 2020-12-04 ENCOUNTER — Ambulatory Visit: Admit: 2020-12-04 | Discharge: 2020-12-09 | Payer: MEDICARE

## 2020-12-04 ENCOUNTER — Encounter: Admit: 2020-12-04 | Discharge: 2020-12-09 | Payer: MEDICARE | Attending: Anesthesiology

## 2020-12-04 ENCOUNTER — Encounter
Admit: 2020-12-04 | Discharge: 2020-12-09 | Payer: MEDICARE | Attending: Student in an Organized Health Care Education/Training Program

## 2020-12-04 ENCOUNTER — Encounter: Admit: 2020-12-04 | Discharge: 2020-12-09 | Payer: MEDICARE | Attending: Surgery

## 2020-12-04 ENCOUNTER — Ambulatory Visit
Admit: 2020-12-04 | Discharge: 2020-12-09 | Disposition: A | Discharge status: Home / Self Care | Payer: MEDICARE | Admitting: Student in an Organized Health Care Education/Training Program

## 2020-12-04 DIAGNOSIS — I517 Cardiomegaly: Secondary | ICD-10-CM | POA: Diagnosis not present

## 2020-12-04 DIAGNOSIS — N186 End stage renal disease: Secondary | ICD-10-CM | POA: Diagnosis not present

## 2020-12-04 DIAGNOSIS — I2510 Atherosclerotic heart disease of native coronary artery without angina pectoris: Secondary | ICD-10-CM | POA: Diagnosis not present

## 2020-12-04 DIAGNOSIS — R918 Other nonspecific abnormal finding of lung field: Secondary | ICD-10-CM | POA: Diagnosis not present

## 2020-12-04 DIAGNOSIS — Z940 Kidney transplant status: Secondary | ICD-10-CM | POA: Diagnosis not present

## 2020-12-04 DIAGNOSIS — I499 Cardiac arrhythmia, unspecified: Secondary | ICD-10-CM | POA: Diagnosis not present

## 2020-12-05 DIAGNOSIS — Z794 Long term (current) use of insulin: Secondary | ICD-10-CM | POA: Diagnosis not present

## 2020-12-05 DIAGNOSIS — R638 Other symptoms and signs concerning food and fluid intake: Secondary | ICD-10-CM | POA: Diagnosis not present

## 2020-12-05 DIAGNOSIS — D849 Immunodeficiency, unspecified: Secondary | ICD-10-CM | POA: Diagnosis not present

## 2020-12-05 DIAGNOSIS — E1165 Type 2 diabetes mellitus with hyperglycemia: Secondary | ICD-10-CM | POA: Diagnosis not present

## 2020-12-05 DIAGNOSIS — Z940 Kidney transplant status: Secondary | ICD-10-CM | POA: Diagnosis not present

## 2020-12-05 DIAGNOSIS — I10 Essential (primary) hypertension: Secondary | ICD-10-CM | POA: Diagnosis not present

## 2020-12-05 DIAGNOSIS — Z79899 Other long term (current) drug therapy: Secondary | ICD-10-CM | POA: Diagnosis not present

## 2020-12-05 MED ORDER — TACROLIMUS 1 MG CAPSULE, IMMEDIATE-RELEASE
ORAL_CAPSULE | Freq: Two times a day (BID) | ORAL | 11 refills | 30 days | Status: CP
Start: 2020-12-05 — End: 2021-12-05

## 2020-12-05 MED ORDER — VALGANCICLOVIR 450 MG TABLET
ORAL_TABLET | Freq: Every day | ORAL | 2 refills | 30 days | Status: CP
Start: 2020-12-05 — End: 2021-03-05

## 2020-12-05 MED ORDER — MYCOPHENOLATE SODIUM 180 MG TABLET,DELAYED RELEASE
ORAL_TABLET | Freq: Two times a day (BID) | ORAL | 11 refills | 30 days | Status: CP
Start: 2020-12-05 — End: 2021-12-05
  Filled 2020-12-09: qty 180, 30d supply, fill #0

## 2020-12-06 DIAGNOSIS — Z940 Kidney transplant status: Secondary | ICD-10-CM | POA: Diagnosis not present

## 2020-12-06 DIAGNOSIS — R638 Other symptoms and signs concerning food and fluid intake: Secondary | ICD-10-CM | POA: Diagnosis not present

## 2020-12-06 DIAGNOSIS — T380X5A Adverse effect of glucocorticoids and synthetic analogues, initial encounter: Secondary | ICD-10-CM | POA: Diagnosis not present

## 2020-12-06 DIAGNOSIS — Z794 Long term (current) use of insulin: Secondary | ICD-10-CM | POA: Diagnosis not present

## 2020-12-06 DIAGNOSIS — E1165 Type 2 diabetes mellitus with hyperglycemia: Secondary | ICD-10-CM | POA: Diagnosis not present

## 2020-12-07 DIAGNOSIS — Z940 Kidney transplant status: Principal | ICD-10-CM

## 2020-12-07 DIAGNOSIS — Z79899 Other long term (current) drug therapy: Principal | ICD-10-CM

## 2020-12-07 DIAGNOSIS — R638 Other symptoms and signs concerning food and fluid intake: Secondary | ICD-10-CM | POA: Diagnosis not present

## 2020-12-07 DIAGNOSIS — E1165 Type 2 diabetes mellitus with hyperglycemia: Secondary | ICD-10-CM | POA: Diagnosis not present

## 2020-12-07 DIAGNOSIS — T380X5A Adverse effect of glucocorticoids and synthetic analogues, initial encounter: Secondary | ICD-10-CM | POA: Diagnosis not present

## 2020-12-07 DIAGNOSIS — Z794 Long term (current) use of insulin: Secondary | ICD-10-CM | POA: Diagnosis not present

## 2020-12-08 DIAGNOSIS — Z5181 Encounter for therapeutic drug level monitoring: Secondary | ICD-10-CM | POA: Diagnosis not present

## 2020-12-08 DIAGNOSIS — E1165 Type 2 diabetes mellitus with hyperglycemia: Secondary | ICD-10-CM | POA: Diagnosis not present

## 2020-12-08 DIAGNOSIS — T380X5A Adverse effect of glucocorticoids and synthetic analogues, initial encounter: Secondary | ICD-10-CM | POA: Diagnosis not present

## 2020-12-08 DIAGNOSIS — R638 Other symptoms and signs concerning food and fluid intake: Secondary | ICD-10-CM | POA: Diagnosis not present

## 2020-12-08 DIAGNOSIS — Z940 Kidney transplant status: Secondary | ICD-10-CM | POA: Diagnosis not present

## 2020-12-08 DIAGNOSIS — Z79899 Other long term (current) drug therapy: Secondary | ICD-10-CM | POA: Diagnosis not present

## 2020-12-08 DIAGNOSIS — N179 Acute kidney failure, unspecified: Secondary | ICD-10-CM | POA: Diagnosis not present

## 2020-12-08 MED ORDER — TACROLIMUS 1 MG CAPSULE, IMMEDIATE-RELEASE
ORAL_CAPSULE | Freq: Two times a day (BID) | ORAL | 11 refills | 30.00000 days | Status: CP
Start: 2020-12-08 — End: 2021-12-08
  Filled 2020-12-09: qty 420, 30d supply, fill #0

## 2020-12-08 MED ORDER — FAMOTIDINE 20 MG TABLET
ORAL_TABLET | Freq: Every day | ORAL | 11 refills | 30 days | Status: CP
Start: 2020-12-08 — End: 2021-01-07
  Filled 2020-12-09: qty 30, 30d supply, fill #0

## 2020-12-08 MED ORDER — DOCUSATE SODIUM 100 MG CAPSULE
ORAL_CAPSULE | Freq: Two times a day (BID) | ORAL | 11 refills | 30.00000 days | Status: CP | PRN
Start: 2020-12-08 — End: 2021-01-07
  Filled 2020-12-09: qty 60, 30d supply, fill #0

## 2020-12-08 MED ORDER — ACETAMINOPHEN 500 MG TABLET
ORAL_TABLET | Freq: Four times a day (QID) | ORAL | 11 refills | 8.00000 days | Status: CP | PRN
Start: 2020-12-08 — End: ?
  Filled 2020-12-09: qty 100, 13d supply, fill #0

## 2020-12-08 MED ORDER — INSULIN GLARGINE (U-100) 100 UNIT/ML (3 ML) SUBCUTANEOUS PEN
Freq: Every evening | SUBCUTANEOUS | 11 refills | 37 days | Status: CP
Start: 2020-12-08 — End: 2021-01-07

## 2020-12-08 MED ORDER — CARVEDILOL 25 MG TABLET
ORAL_TABLET | Freq: Two times a day (BID) | ORAL | 11 refills | 30 days | Status: CP
Start: 2020-12-08 — End: 2021-01-07
  Filled 2020-12-09: qty 60, 30d supply, fill #0

## 2020-12-08 MED ORDER — POLYETHYLENE GLYCOL 3350 17 GRAM/DOSE ORAL POWDER
Freq: Every day | ORAL | 11 refills | 30 days | Status: CP | PRN
Start: 2020-12-08 — End: 2021-01-07
  Filled 2020-12-09: qty 510, 30d supply, fill #0

## 2020-12-08 MED ORDER — CYANOCOBALAMIN (VIT B-12) 100 MCG TABLET
ORAL_TABLET | Freq: Every day | ORAL | 11 refills | 100 days | Status: CP
Start: 2020-12-08 — End: ?
  Filled 2020-12-09: qty 100, 100d supply, fill #0

## 2020-12-08 MED ORDER — ASPIRIN 81 MG TABLET,DELAYED RELEASE
ORAL_TABLET | Freq: Every day | ORAL | 11 refills | 30 days | Status: CP
Start: 2020-12-08 — End: 2021-12-08
  Filled 2020-12-09: qty 30, 30d supply, fill #0

## 2020-12-08 MED ORDER — MG-PLUS-PROTEIN 133 MG TABLET
ORAL_TABLET | Freq: Two times a day (BID) | ORAL | 11 refills | 50.00000 days | Status: CP
Start: 2020-12-08 — End: ?
  Filled 2020-12-09: qty 100, 50d supply, fill #0

## 2020-12-08 MED ORDER — SULFAMETHOXAZOLE 400 MG-TRIMETHOPRIM 80 MG TABLET
ORAL_TABLET | ORAL | 5 refills | 28 days | Status: CP
Start: 2020-12-08 — End: ?
  Filled 2020-12-09: qty 12, 28d supply, fill #0

## 2020-12-08 MED ORDER — LEVOTHYROXINE 75 MCG TABLET
ORAL_TABLET | Freq: Every day | ORAL | 11 refills | 30 days | Status: CP
Start: 2020-12-08 — End: 2021-01-07

## 2020-12-08 MED ORDER — GABAPENTIN 100 MG CAPSULE
ORAL_CAPSULE | Freq: Every evening | ORAL | 0 refills | 11 days | Status: CP
Start: 2020-12-08 — End: 2021-01-07
  Filled 2020-12-09: qty 11, 11d supply, fill #0

## 2020-12-08 MED ORDER — MELATONIN 5 MG CHEWABLE TABLET
ORAL_TABLET | Freq: Every evening | ORAL | 11 refills | 0 days | Status: CP | PRN
Start: 2020-12-08 — End: ?

## 2020-12-08 MED ORDER — INSULIN LISPRO (U-100) 100 UNIT/ML SUBCUTANEOUS PEN
Freq: Three times a day (TID) | SUBCUTANEOUS | 11 refills | 42 days | Status: CP
Start: 2020-12-08 — End: 2021-01-19
  Filled 2020-12-09: qty 15, 42d supply, fill #0

## 2020-12-08 MED ORDER — TRAMADOL 50 MG TABLET
ORAL_TABLET | Freq: Two times a day (BID) | ORAL | 0 refills | 7 days | Status: CP | PRN
Start: 2020-12-08 — End: 2020-12-15
  Filled 2020-12-09: qty 28, 7d supply, fill #0

## 2020-12-08 MED ORDER — NITROGLYCERIN 0.4 MG SUBLINGUAL TABLET
ORAL_TABLET | SUBLINGUAL | 1 refills | 0 days | Status: CP | PRN
Start: 2020-12-08 — End: ?
  Filled 2020-12-09: qty 25, 8d supply, fill #0

## 2020-12-08 MED ORDER — ACYCLOVIR 400 MG TABLET
ORAL_TABLET | Freq: Two times a day (BID) | ORAL | 2 refills | 30 days | Status: CP
Start: 2020-12-08 — End: ?
  Filled 2020-12-09: qty 60, 30d supply, fill #0

## 2020-12-08 MED ORDER — FUROSEMIDE 40 MG TABLET
ORAL_TABLET | Freq: Every day | ORAL | 5 refills | 30 days | Status: CP | PRN
Start: 2020-12-08 — End: 2021-01-07
  Filled 2020-12-09: qty 60, 30d supply, fill #0

## 2020-12-08 MED ORDER — ROSUVASTATIN 10 MG TABLET
ORAL_TABLET | Freq: Every day | ORAL | 11 refills | 30 days | Status: CP
Start: 2020-12-08 — End: 2021-01-07
  Filled 2020-12-09: qty 30, 30d supply, fill #0

## 2020-12-08 MED ORDER — AMLODIPINE 10 MG TABLET
ORAL_TABLET | Freq: Every day | ORAL | 11 refills | 30 days | Status: CP
Start: 2020-12-08 — End: ?

## 2020-12-09 DIAGNOSIS — Z5181 Encounter for therapeutic drug level monitoring: Secondary | ICD-10-CM | POA: Diagnosis not present

## 2020-12-09 DIAGNOSIS — Z940 Kidney transplant status: Secondary | ICD-10-CM | POA: Diagnosis not present

## 2020-12-09 DIAGNOSIS — Z79899 Other long term (current) drug therapy: Secondary | ICD-10-CM | POA: Diagnosis not present

## 2020-12-09 NOTE — Unmapped (Signed)
Discharge Summary    Admit date: 12/03/2020    Discharge date and time: 12/09/20     Discharge to:  Home    Discharge Service: Surg Transplant [facility])    Discharge Attending Physician: Gemma Payor, MD    Discharge  Diagnoses: s/p DDKT    Secondary Diagnosis: Principal Problem:    Kidney transplant status, cadaveric POA: Not Applicable  Resolved Problems:    * No resolved hospital problems. *      OR Procedures:    Right - RENAL ALLOTRANSPLANTATION, IMPLANTATION OF GRAFT; WITHOUT RECIPIENT NEPHRECTOMY  Right - BACKBNCH STD PREP CAD DONR RENAL ALLOGFT PRIOR TO TRNSPLNT, INCL DISSEC/REM PERINEPH FAT, DIAPH/RTPER ATTAC  Date  12-Dec-2020  -------------------     Ancillary Procedures: no procedures    Discharge Day Services: The patient was seen on the day of discharge by the transplant surgery team. VS and assessments were stable. All discharge instructions were reviewed and all questions were answered. Drain care was taught and reviewed by the nursing staff prior to discharge. See below for assessment specifics      Subjective   No acute events overnight. Pain Controlled. No fever or chills.    Objective   Patient Vitals for the past 8 hrs:   BP Temp Temp src Pulse Resp SpO2 Weight   12/09/20 0902 -- -- -- -- -- -- (!) 113.7 kg (250 lb 9.6 oz)   12/09/20 0853 151/76 36.9 ??C (98.4 ??F) Oral 72 18 98 % --   12/09/20 0427 170/73 37 ??C (98.6 ??F) Oral 66 17 96 % --     I/O this shift:  In: 30 [P.O.:30]  Out: 310 [Urine:290; Drains:20]    General Appearance:   No acute distress  Lungs:                Clear to auscultation bilaterally  Heart:                           Regular rate and rhythm  Abdomen:                Soft, non-distended. Incision CDI and appropriately tender. Drain in place with dressing CDI and SS output.  Extremities:              Warm and well perfused      Hospital Course:  Nicholas Strong is a 55 y.o. male with history of ESRD secondary to diabetes mellitus Polyneuropathy, Renal Stone, and Renal Cell Ca (S/p Left nephrectomy) who presented on 2022/12/13 for deceased donor renal transplantation.     The patient was taken to the OR on 12-Dec-2020 for DBD HCV NAT+ kidney transplantation. He tolerated the procedure well, was extubated in the OR, and was taken to the PACU where He received routine postoperative care. He was then transferred to the Surgical Stepdown unit for close observation and cardiorespiratory monitoring. The allograft was evaluated with ultrasonography and found to have elevated velocity in the main renal artery at the level of the anastomosis measuring 3.9 m/s, likely secondary to postoperative edema. Otherwise, patent renal transplant vasculature with normal resistive indices. He was initially placed on 1:1 UOP/IVF replacement and then transitioned to 0.9% NS at 153mL/hr.     He was clinically stable postoperatively, maintaining adequate urine output, and was transferred to the floor on POD#1. He did well thereafter and diet was slowly advanced. At the time of discharge patient was tolerating a regular diet, had pain  controlled with PO pain medication, and able to return to the preoperative ambulatory status. The patient was able to void spontaneously following discontinuation of Foley catheter POD#4. Anti-rejection medication levels were monitored and dosages adjusted to maintain a therapeutic regimen with the input of Pharmacy. The patient was seen and assessed by Physical Therapy and deemed suitable for discharge.     Endocrinology was consulted and followed patient making recommendations for glycemic control. They provided the following discharge endo recs:  - Resume home regimen of Lantus 40 units daily. Increase dose by 2 units every other day until fasting BG <140 mg/dL. Max dose 52 units. Or reduce dose by 5 units if experiencing hypoglycemia (BG <80 mg/dL)  - Monitor BG 1-6X/WRU   - humalog (lispro) sliding scale 2:50>150     He will be discharged home on POD#5 in stable condition without home healthcare, with JP drains in place, and  without urinary foley.        Condition at Discharge: Improved  Discharge Medications:      Medication List      START taking these medications    ??? acyclovir 400 MG tablet; Commonly known as: ZOVIRAX; Take 1 tablet (400   mg total) by mouth Two (2) times a day.  ??? aspirin 81 MG tablet; Commonly known as: ECOTRIN; Take 1 tablet (81 mg   total) by mouth daily.  ??? docusate sodium 100 MG capsule; Commonly known as: COLACE; Take 1   capsule (100 mg total) by mouth two (2) times a day as needed for   constipation.  ??? famotidine 20 MG tablet; Commonly known as: PEPCID; Take 1 tablet (20 mg   total) by mouth daily.  ??? furosemide 40 MG tablet; Commonly known as: LASIX; Take 2 tablets (80 mg   total) by mouth daily as needed.  ??? gabapentin 100 MG capsule; Commonly known as: NEURONTIN; Take 1 capsule   (100 mg total) by mouth nightly.  ??? insulin glargine 100 unit/mL (3 mL) injection pen; Commonly known as:   BASAGLAR, LANTUS; Inject 40 Units total under the skin nightly.; Replaces:   insulin glargine 100 unit/mL injection  ??? insulin lispro 100 unit/mL injection pen; Commonly known as: HumaLOG;   Inject 0-12 Units under the skin Three (3) times a day before meals.   Inject 2 units for every 50 mg/dL over 045 mg/dL  ??? magnesium (amino acid chelate) 133 mg Tab; Generic drug: magnesium   oxide-Mg AA chelate; Take 1 tablet by mouth Two (2) times a day. HOLD   until directed to start by your coordinator.  ??? mycophenolate 180 MG EC tablet; Commonly known as: MYFORTIC; Take 3   tablets (540 mg total) by mouth Two (2) times a day. Adjust dose per   medication card.  ??? polyethylene glycol 17 gram/dose powder; Commonly known as: GLYCOLAX;   Mix 1 capful (17 grams) in 4-8 ounces of water,juice or tea and drink   daily as needed.  ??? rosuvastatin 10 MG tablet; Commonly known as: CRESTOR; Take 1 tablet (10   mg total) by mouth daily.  ??? sulfamethoxazole-trimethoprim 400-80 mg per tablet; Commonly known as:   BACTRIM; Take 1 tablet (80 mg of trimethoprim total) by mouth 3 (three)   times a week.  ??? tacrolimus 1 MG capsule; Commonly known as: PROGRAF; Take 7 capsules (7   mg total) by mouth two (2) times a day.  ??? traMADoL 50 mg tablet; Commonly known as: ULTRAM; Take 1-2 tablets   (  50-100 mg total) by mouth every twelve (12) hours as needed for up to 7   days.  ??? valGANciclovir 450 mg tablet; Commonly known as: VALCYTE; Take 1 tablet   (450 mg total) by mouth daily.     CHANGE how you take these medications    ??? acetaminophen 500 MG tablet; Commonly known as: TYLENOL; Take 1-2   tablets (500-1,000 mg total) by mouth every six (6) hours as needed for   pain or fever.; What changed: how much to take, reasons to take this,   additional instructions  ??? melatonin 5 mg Chew; Chew 1 tablet nightly as needed.; What changed:   when to take this, reasons to take this     CONTINUE taking these medications    ??? amLODIPine 10 MG tablet; Commonly known as: NORVASC; Take 1 tablet (10   mg total) by mouth daily.  ??? carvediloL 25 MG tablet; Commonly known as: COREG; Take 1 tablet (25 mg   total) by mouth Two (2) times a day.  ??? cyanocobalamin 100 MCG tablet; Commonly known as: vitamin B-12; Take 1   tablet (100 mcg total) by mouth daily.  ??? diphenhydrAMINE 25 mg capsule; Commonly known as: BENADRYL  ??? levothyroxine 75 MCG tablet; Commonly known as: SYNTHROID; Take 1 tablet   (75 mcg total) by mouth daily.  ??? nitroglycerin 0.4 MG SL tablet; Commonly known as: NITROSTAT; Place 1   tablet (0.4 mg total) under the tongue continuous as needed for chest   pain. Maximum of 3 doses in 15 minutes.     STOP taking these medications    ??? ascorbic acid (vitamin C) 500 MG tablet; Commonly known as: VITAMIN C  ??? atorvastatin 40 MG tablet; Commonly known as: LIPITOR  ??? calcium acetate(phosphat bind) 667 mg capsule; Commonly known as: PHOSLO  ??? fish oil-omega-3 fatty acids 300-1,000 mg capsule  ??? insulin glargine 100 unit/mL injection; Commonly known as: LANTUS;   Replaced by: insulin glargine 100 unit/mL (3 mL) injection pen  ??? oxyCODONE-acetaminophen 5-325 mg per tablet; Commonly known as: PERCOCET       Pending Test Results:     Discharge Instructions:  Activity:     Diet:    Other Instructions:  Other Instructions     Discharge instructions      Discharge Instructions:  Activity: Do not lift > 10-15 lbs for 1st 6 weeks, then gradually increase to 25 lbs over the following 6 weeks. Resume heavy lifting only after being cleared to do so at follow-up appointment in Transplant Surgery clinic.    Diet: regular    Other Instructions: Do not start taking Mg Plus Protein until seen in clinic and told to start    Diabetes Instructions:   -Monitor BG 3-4x/day??  -Lantus:   Resume home regimen of Lantus 40 units daily. Increase dose by 2 units every other day until fasting BG <140 mg/dL. Max dose 52 units. Or reduce dose by 5 units if experiencing hypoglycemia (BG <80 mg/dL)  -Correctional Insulin:   humalog (lispro) sliding scale 2:50>150. See standard sliding scale below    Blood Sugar Humalog (lispro)  <??70            Treat low  70-150             0  151-200 2  201-250 4  251-300 6  301-350 8  351-400 10  > 400             12??and call provider  ??  Your Post-Transplant Coordinator is [employer]. Contact your transplant coordinator or the Transplant Surgery Office [phone]) during business hours or page the transplant coordinator on call [phone]) after business hours for:    - fever >100.5 degrees F by mouth, any fever with shaking chills, or other signs or symptoms of infection   - uncontrolled nausea, vomiting, or diarrhea; inability to have a bowel movement for > 3 days.   - any problem that prevents taking medications as scheduled.   - pain uncontrolled with prescribed medication or new pain or tenderness at the surgical site   - sudden weight gain or increase in blood pressure (greater than 140/85)   - shortness of breath, chest pain / discomfort   - new or increasing jaundice   - urinary symptoms including pain / difficulty / burning or tea-colored urine   - any other new or concerning symptoms   - questions regarding your medications or continuing care      Patient may shower if wound is covered with water occlusive dressing. The wound must be kept dry. Do not immerse wounds in bath or pool. Wash the surgical site with sterile water or sterile normal saline only, do not scrub vigorously.    You may dress wounds with dry gauze and tape to avoid soilage.    Do not drive or operate heavy machinery prior to MD clearance, or at any time while taking narcotics.    Inspect surgical sites at least twice daily, contact Transplant Coordinator for spreading redness, purulent discharge, or increasing bleeding or drainage, or for separation of wounds.     Maintain a written record of daily vital signs, per Handbook instructions.     Maintain a written record of medications taken and review against the discharge medications sheet. Periodically review your Transplant Handbook for important information regarding postoperative care and required precautions.      Labs and Other Follow-ups after Discharge:   Labs 3x week: CBC, BMP, Mg, Phos and Tacrolimus trough level   Labs every 3 months: Hepatic function panel    Kidney Post-Transplant Coordinator:  Laury Deep- phone: [phone] fax: [phone]        Labs and Other Follow-ups after Discharge:  Follow Up instructions and Outpatient Referrals     Discharge instructions          Future Appointments:  Appointments which have been scheduled for you    Dec 13, 2020 11:30 AM  (Arrive by 11:20 AM)  RETURN HCP TELEPHONE with Eliezer Bottom, LCSW  [facility] KIDNEY TRANSPLANT [facility]) [address]  [place] [address]  [phone]      Dec 14, 2020 10:00 AM  (Arrive by 9:30 AM)  LAB ONLY with LAB PHLEB GRND [facility]  LAB [facility]ND [facility]) [address]  [place] [address]  [phone]      Dec 14, 2020 11:30 AM  (Arrive by 11:00 AM)  RETURN SURGERY with Jules Husbands, PA  [facility] TRANSPLANT SURGERY [facility]) [address]  [place] [address]  [phone]      Dec 14, 2020 12:00 PM  (Arrive by 11:30 AM)  RETURN PHARMD with TRANSPLANT SURGERY PHARMACY  [facility] TRANSPLANT SURGERY [facility] REGION) [address]  [place] [address]  [phone]      Dec 27, 2020  9:00 AM  (Arrive by 8:45 AM)  LAB  ONLY with [facility] ONLY  [facility] [place] [facility]) [address]  [place] [address]      Dec 27, 2020  9:20 AM  (Arrive by 9:05 AM)  RETURN PHARMD with KIDNEY TRANSPLANT PHARMACY  [facility] KIDNEY TRANSPLANT [place] [facility]) [address]  [place] [address]  [phone]      Dec 27, 2020 10:00 AM  (Arrive by 9:45 AM)  RETURN NEPHROLOGY POST with Leafy Half, MD  [facility] KIDNEY TRANSPLANT [place] [facility] REGION) [address]  [place] [address]  [phone]

## 2020-12-11 ENCOUNTER — Other Ambulatory Visit (HOSPITAL_COMMUNITY)
Admission: RE | Admit: 2020-12-11 | Discharge: 2020-12-11 | Disposition: A | Discharge status: Home / Self Care | Payer: Medicare Other | Source: Ambulatory Visit | Attending: Pediatric Nephrology | Admitting: Pediatric Nephrology

## 2020-12-11 DIAGNOSIS — Z09 Encounter for follow-up examination after completed treatment for conditions other than malignant neoplasm: Secondary | ICD-10-CM | POA: Diagnosis not present

## 2020-12-11 DIAGNOSIS — Z79899 Other long term (current) drug therapy: Secondary | ICD-10-CM | POA: Diagnosis not present

## 2020-12-11 DIAGNOSIS — Z9483 Pancreas transplant status: Secondary | ICD-10-CM | POA: Insufficient documentation

## 2020-12-11 DIAGNOSIS — B259 Cytomegaloviral disease, unspecified: Secondary | ICD-10-CM | POA: Diagnosis not present

## 2020-12-11 DIAGNOSIS — D631 Anemia in chronic kidney disease: Secondary | ICD-10-CM | POA: Diagnosis not present

## 2020-12-11 DIAGNOSIS — D899 Disorder involving the immune mechanism, unspecified: Secondary | ICD-10-CM | POA: Insufficient documentation

## 2020-12-11 DIAGNOSIS — Z114 Encounter for screening for human immunodeficiency virus [HIV]: Secondary | ICD-10-CM | POA: Diagnosis not present

## 2020-12-11 DIAGNOSIS — Z940 Kidney transplant status: Secondary | ICD-10-CM | POA: Diagnosis not present

## 2020-12-11 DIAGNOSIS — Z789 Other specified health status: Secondary | ICD-10-CM | POA: Diagnosis not present

## 2020-12-11 DIAGNOSIS — E1129 Type 2 diabetes mellitus with other diabetic kidney complication: Secondary | ICD-10-CM | POA: Insufficient documentation

## 2020-12-11 DIAGNOSIS — E559 Vitamin D deficiency, unspecified: Secondary | ICD-10-CM | POA: Insufficient documentation

## 2020-12-11 DIAGNOSIS — N390 Urinary tract infection, site not specified: Secondary | ICD-10-CM | POA: Insufficient documentation

## 2020-12-11 LAB — RENAL FUNCTION PANEL
ANION GAP: 9 (ref 5–151)
BLOOD UREA NITROGEN: 66 mg/dL — ABNORMAL HIGH (ref 6–20)
CALCIUM: 8.9 mg/dL (ref 8.9–10.3)
CHLORIDE: 109 (ref 98–111)
CO2: 25 mmol/L
CREATININE: 3.6 mg/dL — ABNORMAL HIGH (ref 0.61–1.241)
EGFR MDRD AF AMER: 19 mL/min — ABNORMAL LOW
GLUCOSE RANDOM: 193 mg/dL — ABNORMAL HIGH (ref 70–99)
POTASSIUM: 4.4 mmol/L (ref 3.5–5.1)
SODIUM: 143 mMol/L (ref 135–145)

## 2020-12-11 LAB — CBC W/ DIFFERENTIAL
BASOPHILS ABSOLUTE COUNT: 0 10*3/uL (ref 0.0–0.11)
BASOPHILS RELATIVE PERCENT: 0 %
EOSINOPHILS ABSOLUTE COUNT: 0.2 10*3/uL (ref 0.0–0.51)
EOSINOPHILS RELATIVE PERCENT: 2 %
HEMATOCRIT: 31.3 — ABNORMAL LOW (ref 39.0–52.01)
HEMOGLOBIN: 9.5 g/dL — ABNORMAL LOW (ref 13.0–17.0)
IMMATURE CELLS: 1
LYMPHOCYTES ABSOLUTE COUNT: 0.2 10*3/uL — ABNORMAL LOW (ref 0.7–4.01)
LYMPHOCYTES RELATIVE PERCENT: 3 %
MEAN CORPUSCULAR HEMOGLOBIN CONC: 30.4 g/dL (ref 30.0–36.0)
MEAN CORPUSCULAR HEMOGLOBIN: 28.3 % (ref 26.0–34.01)
MEAN CORPUSCULAR VOLUME: 93.2 fL (ref 80.0–100.0)
MONOCYTES ABSOLUTE COUNT: 0.5 10*3/uL (ref 0.1–1.01)
MONOCYTES RELATIVE PERCENT: 6 %
NEUTROPHILS ABSOLUTE COUNT: 6.6
NEUTROPHILS RELATIVE PERCENT: 88 %
NUCLEATED RED BLOOD CELLS: 0 (ref 0.0–0.21)
PLATELET COUNT: 154 10*3/uL (ref 150–400)
RED BLOOD CELL COUNT: 3.36 MIL/uL — ABNORMAL LOW (ref 4.22–5)
RED CELL DISTRIBUTION WIDTH: 15.5 (ref 1.5–15.5)
WHITE BLOOD CELL COUNT: 7.5 10*3/uL (ref 4.0–10.5)

## 2020-12-11 LAB — URINALYSIS
BILIRUBIN UA: NEGATIVE
GLUCOSE UA: 50 mg/dL — AB
KETONES UA: NEGATIVE mg/dL
NITRITE UA: NEGATIVE
PH UA: 7 (ref 5.0–8.01)
PROTEIN UA: 30 — AB
SPECIFIC GRAVITY UA: 1.01 (ref 1.005–1.030)

## 2020-12-11 LAB — PHOSPHORUS
PHOSPHORUS: 2.9 mg/dL (ref 2.5–4.6)
Phosphorus: 2.9 mg/dL (ref 2.5–4.6)

## 2020-12-11 LAB — MAGNESIUM
MAGNESIUM: 1.7 mg/dL (ref 1.7–2.4)
Magnesium: 1.7 mg/dL (ref 1.7–2.4)

## 2020-12-11 LAB — PROTEIN / CREATININE RATIO, URINE
CREATININE, URINE: 113.03 mg/dL
Creatinine, Urine: 113.03 mg/dL
PROTEIN URINE: 60
PROTEIN/CREAT RATIO, URINE: 0.53 — ABNORMAL HIGH (ref 0.00–0.151)
Protein Creatinine Ratio: 0.53 mg/mg{Cre} — ABNORMAL HIGH (ref 0.00–0.15)
Total Protein, Urine: 60 mg/dL

## 2020-12-11 LAB — BASIC METABOLIC PANEL
Anion gap: 9 (ref 5–15)
BUN: 66 mg/dL — ABNORMAL HIGH (ref 6–20)
CO2: 25 mmol/L (ref 22–32)
Calcium: 8.9 mg/dL (ref 8.9–10.3)
Chloride: 109 mmol/L (ref 98–111)
Creatinine, Ser: 3.6 mg/dL — ABNORMAL HIGH (ref 0.61–1.24)
GFR, Estimated: 19 mL/min — ABNORMAL LOW (ref >60)
Glucose, Bld: 193 mg/dL — ABNORMAL HIGH (ref 70–99)
Potassium: 4.4 mmol/L (ref 3.5–5.1)
Sodium: 143 mmol/L (ref 135–145)

## 2020-12-11 LAB — CBC WITH DIFFERENTIAL/PLATELET
Abs Immature Granulocytes: 0.06 10*3/uL (ref 0.00–0.07)
Basophils Absolute: 0 10*3/uL (ref 0.0–0.1)
Basophils Relative: 0 %
Eosinophils Absolute: 0.2 10*3/uL (ref 0.0–0.5)
Eosinophils Relative: 2 %
HCT: 31.3 % — ABNORMAL LOW (ref 39.0–52.0)
Hemoglobin: 9.5 g/dL — ABNORMAL LOW (ref 13.0–17.0)
Immature Granulocytes: 1 %
Lymphocytes Relative: 3 %
Lymphs Abs: 0.2 10*3/uL — ABNORMAL LOW (ref 0.7–4.0)
MCH: 28.3 pg (ref 26.0–34.0)
MCHC: 30.4 g/dL (ref 30.0–36.0)
MCV: 93.2 fL (ref 80.0–100.0)
Monocytes Absolute: 0.5 10*3/uL (ref 0.1–1.0)
Monocytes Relative: 6 %
Neutro Abs: 6.6 10*3/uL (ref 1.7–7.7)
Neutrophils Relative %: 88 %
Platelets: 154 10*3/uL (ref 150–400)
RBC: 3.36 MIL/uL — ABNORMAL LOW (ref 4.22–5.81)
RDW: 15.5 % (ref 11.5–15.5)
WBC: 7.5 10*3/uL (ref 4.0–10.5)
nRBC: 0 % (ref 0.0–0.2)

## 2020-12-11 LAB — URINALYSIS, ROUTINE W REFLEX MICROSCOPIC
Bilirubin Urine: NEGATIVE
Glucose, UA: 50 mg/dL — AB
Ketones, ur: NEGATIVE mg/dL
Nitrite: NEGATIVE
Protein, ur: 30 mg/dL — AB
Specific Gravity, Urine: 1.01 (ref 1.005–1.030)
pH: 7 (ref 5.0–8.0)

## 2020-12-11 NOTE — Unmapped (Signed)
Called patient to follow up with how he is feeling.  He is having frequent urination, every 45 minutes.  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  At this point, he is able to have blood draws and encouraged to continue with this.  Plan to discuss further at appointment on Thursday.

## 2020-12-11 NOTE — Unmapped (Signed)
[facility]al Path 12/04/20 sent to HIM Amber D Powell   December 11, 2020 1:42 PM

## 2020-12-11 NOTE — Unmapped (Signed)
Received notification from inpatient team that patient will fill at [facility] after discharge.  Patient has been enrolled in specialty calls at [facility].  Onboarding/welcome call set up pending discharge.

## 2020-12-12 LAB — MISC LABCORP TEST (SEND OUT): Labcorp test code: 55070

## 2020-12-12 LAB — URINE CULTURE: Culture: NO GROWTH

## 2020-12-13 ENCOUNTER — Other Ambulatory Visit (HOSPITAL_COMMUNITY)
Admission: RE | Admit: 2020-12-13 | Discharge: 2020-12-13 | Disposition: A | Discharge status: Home / Self Care | Payer: Medicare Other | Attending: Pediatric Nephrology | Admitting: Pediatric Nephrology

## 2020-12-13 ENCOUNTER — Institutional Professional Consult (permissible substitution): Admit: 2020-12-13 | Discharge: 2020-12-14 | Payer: MEDICARE

## 2020-12-13 DIAGNOSIS — N390 Urinary tract infection, site not specified: Secondary | ICD-10-CM | POA: Insufficient documentation

## 2020-12-13 DIAGNOSIS — Z789 Other specified health status: Secondary | ICD-10-CM | POA: Insufficient documentation

## 2020-12-13 DIAGNOSIS — N189 Chronic kidney disease, unspecified: Secondary | ICD-10-CM | POA: Diagnosis not present

## 2020-12-13 DIAGNOSIS — D631 Anemia in chronic kidney disease: Secondary | ICD-10-CM | POA: Diagnosis not present

## 2020-12-13 DIAGNOSIS — Z09 Encounter for follow-up examination after completed treatment for conditions other than malignant neoplasm: Secondary | ICD-10-CM | POA: Insufficient documentation

## 2020-12-13 DIAGNOSIS — E1129 Type 2 diabetes mellitus with other diabetic kidney complication: Secondary | ICD-10-CM | POA: Insufficient documentation

## 2020-12-13 DIAGNOSIS — E1122 Type 2 diabetes mellitus with diabetic chronic kidney disease: Secondary | ICD-10-CM | POA: Diagnosis not present

## 2020-12-13 DIAGNOSIS — B259 Cytomegaloviral disease, unspecified: Secondary | ICD-10-CM | POA: Diagnosis not present

## 2020-12-13 DIAGNOSIS — Z9483 Pancreas transplant status: Secondary | ICD-10-CM | POA: Insufficient documentation

## 2020-12-13 DIAGNOSIS — Z940 Kidney transplant status: Secondary | ICD-10-CM | POA: Insufficient documentation

## 2020-12-13 DIAGNOSIS — D899 Disorder involving the immune mechanism, unspecified: Secondary | ICD-10-CM | POA: Insufficient documentation

## 2020-12-13 LAB — FSAB CLASS 1 ANTIBODY SPECIFICITY
CPRA%: 0
HLA CLASS 1 ANTIBODY RESULT: NEGATIVE

## 2020-12-13 LAB — PHOSPHORUS: Phosphorus: 2.9 mg/dL (ref 2.5–4.6)

## 2020-12-13 LAB — CBC WITH DIFFERENTIAL/PLATELET
Abs Immature Granulocytes: 0.12 10*3/uL — ABNORMAL HIGH (ref 0.00–0.07)
Basophils Absolute: 0 10*3/uL (ref 0.0–0.1)
Basophils Relative: 0 %
Eosinophils Absolute: 0.2 10*3/uL (ref 0.0–0.5)
Eosinophils Relative: 3 %
HCT: 31.4 % — ABNORMAL LOW (ref 39.0–52.0)
Hemoglobin: 9.5 g/dL — ABNORMAL LOW (ref 13.0–17.0)
Immature Granulocytes: 2 %
Lymphocytes Relative: 3 %
Lymphs Abs: 0.2 10*3/uL — ABNORMAL LOW (ref 0.7–4.0)
MCH: 27.8 pg (ref 26.0–34.0)
MCHC: 30.3 g/dL (ref 30.0–36.0)
MCV: 91.8 fL (ref 80.0–100.0)
Monocytes Absolute: 0.6 10*3/uL (ref 0.1–1.0)
Monocytes Relative: 7 %
Neutro Abs: 6.8 10*3/uL (ref 1.7–7.7)
Neutrophils Relative %: 85 %
Platelets: 206 10*3/uL (ref 150–400)
RBC: 3.42 MIL/uL — ABNORMAL LOW (ref 4.22–5.81)
RDW: 15.6 % — ABNORMAL HIGH (ref 11.5–15.5)
WBC: 8 10*3/uL (ref 4.0–10.5)
nRBC: 0 % (ref 0.0–0.2)

## 2020-12-13 LAB — BASIC METABOLIC PANEL
Anion gap: 8 (ref 5–15)
BUN: 53 mg/dL — ABNORMAL HIGH (ref 6–20)
CO2: 24 mmol/L (ref 22–32)
Calcium: 9.1 mg/dL (ref 8.9–10.3)
Chloride: 115 mmol/L — ABNORMAL HIGH (ref 98–111)
Creatinine, Ser: 2.99 mg/dL — ABNORMAL HIGH (ref 0.61–1.24)
GFR, Estimated: 24 mL/min — ABNORMAL LOW (ref >60)
Glucose, Bld: 193 mg/dL — ABNORMAL HIGH (ref 70–99)
Potassium: 5 mmol/L (ref 3.5–5.1)
Sodium: 147 mmol/L — ABNORMAL HIGH (ref 135–145)

## 2020-12-13 LAB — HCV RNA DIAGNOSIS, NAA
HCV RNA, Quantitation: 50900 IU/mL
HCV RNA, log10: 4.707 log10 IU/mL

## 2020-12-13 LAB — TACROLIMUS LEVEL: Tacrolimus (FK506) - LabCorp: 11.5 ng/mL (ref 2.0–20.0)

## 2020-12-13 LAB — MAGNESIUM: Magnesium: 1.8 mg/dL (ref 1.7–2.4)

## 2020-12-13 NOTE — Unmapped (Signed)
[facility] TRANSPLANT CLINIC PHARMACY NOTE  12/14/2020   Nicholas Strong  [id number]    Medication changes today:   1. Decrease tacrolimus to 7 mg AM, 6 mg PM  2. Start chlorthalidone 25 mg daily  3. Restart Ozempic 0.5 mg weekly   4. Increase Lantus to 45U nightly  5. Start Mg 133 mg BID    Education/Adherence tools provided today:  - Provided updated medication list  - Provided additional education on immunosuppression and transplant related medications including reviewing indications of medications, dosing and side effects  - Provided additional education on diabetes management  - Provided additional pill box education    Follow up items:  1. Goal of understanding indications and dosing of immunosuppression medications  2. BG  3. BP  4. Mg levels  5. Logging I/Os    Next visit with pharmacy in 1-2 weeks  ____________________________________________________________________    Nicholas Strong is a 55 y.o. male s/p deceased kidney transplant on 2020/12/23 (Kidney) 2/2 T2DM polyneuropathy, renal stone, and RCC (s/p left nephrectomy).     Immunologic Risk: cPRA 0,, HLA MM 5/6, and first transplant    Induction Agent : thymoglobulin    Donor Factors: KDPI 70%, DBD and HCV NAT+    Other PMH significant for Hypertension, CAD and Hypothyroidism  Post op course complicated by [place] on POD0 showed elevated velocity in the main renal artery at the level of the anastomosis, likely secondary to post-op edema    Rejection History: NTD  Infection History: NTD  ___________________________________________________________________    First visit with transplant pharmacy    Interval History: NTD    Seen by pharmacy today for: medication management, blood glucose management and education and pill box fill and adherence education    CC:  Patient has no complaints today     General: No issues  Neuro: No issues  CV: No issues  Resp: No issues  GI: No issues  GU: No issues  Derm: No issues  Psych: No issues.    Fluid Status:   Meds: lasix 80 mg daily PRN (not needed since discharge)  Edema yes (later in the day, resolved with leg elevation), SOB no  Intake: not recording, seems ~40 oz daily   Output: not recording, urinating regularly   Plan: no change to medications. Fluid goal of at least 80 oz and record I/Os.       Vitals:    12/14/20 1100   BP: 176/77   Pulse: 62   Temp: 36.2 ??C (97.2 ??F)     ___________________________________________________________________    Allergies   Allergen Reactions   ??? Ferric Citrate Nausea And Vomiting       Medications reviewed in EPIC medication station and updated today by the clinical pharmacist practitioner.    Current Outpatient Medications   Medication Instructions   ??? acetaminophen (TYLENOL) 500-1,000 mg, Oral, Every 6 hours PRN   ??? acyclovir (ZOVIRAX) 400 mg, Oral, 2 times a day (standard)   ??? amLODIPine (NORVASC) 10 mg, Oral, Daily (standard)   ??? aspirin (ECOTRIN) 81 mg, Oral, Daily (standard)   ??? carvediloL (COREG) 25 mg, Oral, 2 times a day (standard)   ??? chlorthalidone (HYGROTON) 25 mg, Oral, Every morning   ??? cyanocobalamin (VITAMIN B-12) 100 mcg, Oral, Daily (standard)   ??? diphenhydrAMINE (BENADRYL) 25 mg, Oral, Nightly PRN   ??? docusate sodium (COLACE) 100 mg, Oral, 2 times a day PRN   ??? famotidine (PEPCID) 20 mg, Oral, Daily (standard)   ??? HumaLOG  KwikPen Insulin 0-12 Units, Subcutaneous, 3 times a day (AC), Inject 2 units for every 50 mg/dL over 161 mg/dL   ??? insulin glargine (BASAGLAR, LANTUS) 45 Units, Subcutaneous, Nightly   ??? levothyroxine (SYNTHROID) 75 mcg, Oral, Daily (standard)   ??? magnesium oxide-Mg AA chelate (MAGNESIUM, AMINO ACID CHELATE,) 133 mg 1 tablet, Oral, 2 times a day (standard)   ??? melatonin 5 mg Chew 1 tablet, Oral, Nightly PRN   ??? mycophenolate (MYFORTIC) 540 mg, Oral, 2 times a day (standard), Adjust dose per medication card.   ??? nitroglycerin (NITROSTAT) 0.4 mg, Sublingual, Continuous PRN, Maximum of 3 doses in 15 minutes.    ??? OZEMPIC 0.5 mg, Subcutaneous, Weekly   ??? polyethylene glycol (GLYCOLAX) 17 gram/dose powder Mix 1 capful (17 grams) in 4-8 ounces of water,juice or tea and drink daily as needed.   ??? rosuvastatin (CRESTOR) 10 mg, Oral, Daily (standard)   ??? sulfamethoxazole-trimethoprim (BACTRIM) 400-80 mg per tablet 80 mg of trimethoprim, Oral, 3 times weekly   ??? tacrolimus (PROGRAF) 7 mg, Oral, 2 times a day         GRAFT FUNCTION: improving  Lab Results   Component Value Date    Creatinine 2.75 (H) 12/14/2020    Creatinine 3.60 (H) 12/11/2020    Creatinine 5.71 (H) 12/09/2020    Creatinine 6.95 (H) 12/08/2020       Baseline Scr: TBD  Scr nadir: NTD  Estimated Creatinine Clearance: 35.8 mL/min (A) (based on SCr of 2.75 mg/dL (H)).    Proteinuria/UPC: Yes: 0.53 (12/11/20).  Lab Results   Component Value Date    Protein/Creatinine Ratio, Urine 0.53 (H) 12/11/2020    Protein/Creatinine Ratio, Urine 0.744 12/08/2020       DSA: ntd  No results found for: DSAPT, DSACM, DSA1C    Zero hour biopsy:       Biopsies to date: NTD      CURRENT IMMUNOSUPPRESSION:    Tacrolimus (Prograf) 7 mg BID  Tacrolimus Goal: 8 - 10   Mycophenolate sodium (Myfortic) 540 mg BID      IMMUNOSUPPRESSION DRUG LEVELS:  Lab Results   Component Value Date    Tacrolimus, Trough 8.0 12/09/2020    Tacrolimus, Trough 6.0 12/08/2020    Tacrolimus, Trough 6.8 12/07/2020    Tacrolimus, Timed 12.7 12/14/2020     Prograf level is accurate 12 hour trough    Patient is tolerating immunosuppression well    WBC/ANC:  wnl  Lab Results   Component Value Date    WBC 7.8 12/14/2020    WBC 7.5 12/11/2020       Plan: Will decrease  tacrolimus to 7 mg AM, 6 mg PM. Continue to monitor.      OI Prophylaxis:   CMV Status: D- /R-, low risk.   CMV prophylaxis: acyclovir 400 mg bid x 3 months per protocol.  No results found for: CMVCP    PCP Prophylaxis: bactrim SS 1 tab MWF x 6 months.    Thrush: completed in hospital    Patient is  tolerating infectious prophylaxis well    Plan: Continue per protocol. Continue to monitor.      CV Prophylaxis: asa 81 mg   The ASCVD Risk Score Denman George DC Montez Hageman, et al., 2013) failed to return a valid value for an unknown reason.  No results found for: LIPID    Statin therapy: Indicated; currently on rosuvastatin 10 mg (switched from prior atorvastatin 40 mg d/t potential use of Mavyret outpatient for HCV based on donor  HCV NAT+ status)  Plan: no changes. Continue to monitor       BP: Goal < 140/90. Clinic vitals reported above  Home BP ranges:   Date AM BP PM BP   9/11 137/79 147/77   9/12 153/74 154/72   9/13 150/77 174/81   9/14 147/73 150/78   9/15 147/73      Current meds include: carvedilol 25 mg BID, amlodipine 10 mg daily  Plan: out of goal, start chlorthalidone 25 mg daily. Continue to monitor    Anemia of CKD:  H/H:   Lab Results   Component Value Date    HGB 9.9 (L) 12/14/2020     Lab Results   Component Value Date    HCT 31.2 (L) 12/14/2020     Iron panel:  No results found for: IRON, TIBC, FERRITIN  No results found for: LABIRON    Prior ESA use: none      Plan: out of goal. No change.  Continue to monitor.     DM:   Lab Results   Component Value Date    A1C 5.3 12/03/2020   . Goal A1c < 7  History of Dm? Yes: T2DM since 1997  Established with endocrinologist/PCP for BG managment? No  Currently on: Lantus 40U daily (taking in the morning), Humalog SSI  Home BS log:   Breakfast Lunch  Dinner  HS   Date AC PC [facility]    9/12 262  213  204  219   9/13 219  249  177  268   9/14 260  198  127  226   9/15 232           Diet: good appetite, pt eats 2 meals per day  Exercise: not assessed  Hypoglycemia: no  Plan: Increase Lantus to 45U qHS (pt will switch to using Lantus at night after discussion today). and Start Ozempic 0.5 mg weekly (pt was taking prior to txp). Continue to monitor.      Electrolytes: Mag is low, K is elevated   Lab Results   Component Value Date    Potassium 5.4 (H) 12/14/2020    Sodium 141 12/14/2020    Magnesium 1.2 (L) 12/14/2020    CO2 24.0 12/14/2020    Phosphorus 3.1 12/14/2020 Meds currently on: none   Plan: Start Mg 133 mg BID. Continue to monitor     GI/BM: pt reports normal BMs now, a couple epidoses of watery diarrhea 2 days ago and then the following morning that has since resolved  Meds currently on: docusate PRN (not using), Miralax PRN (not using), famotidine 20 mg daily  Plan: No change.  Continue to monitor    Pain: pt reports no pain  Meds currently on: APAP PRN (not using), tramadol PRN (not using), gabapentin per protocol (not using)  Plan: No change. Continue to monitor    Bone health:   Vitamin D Level: none available. Goal > 30.   No results found for: VITDTOT, VITDTOTAL, VITDTOTAL, VITDTOTAL, VITDTOTAL    Lab Results   Component Value Date    Calcium 9.1 12/14/2020    Calcium 8.9 12/11/2020       Last DEXA results:  none available  Current meds include: none   Plan: Vitamin D level  needs to be drawn with next lab schedule,assess supplementation needs. Continue to monitor.     Women's/Men's Health:  Nicholas Strong is a 55 y.o. male. Patient reports no men's/women's health issues  Plan: Continue to monitor    Hypothyroidism:  Current meds: levothyroxine 75 mcg  Plan: no changes. Continue to monitor and follow-up with TSH.    Sleep:  Patient is sleeping well since discharge, not taking anything for sleep  Current meds: benadryl (not taking), melatonin 5 mg PRN   Plan: no changes. Continue to monitor    Immunizations:  Influenza [Annual]: Received 2021    PCV13: Never received  PPSV23: Received 05/2015  PCV20: Never received    Shingrix Zoster [2 doses, 2 - 6 months apart]: Never received    COVID-19 [3 primary doses, 2 boosters]: 1st dose given 06/11/19, 2nd dose given 07/07/19, 3rd dose given 12/31/19 and Booster given 08/31/20    Immunization status: needs Influenza vaccine and bivalent COVID booster      Pharmacy preference:  Will plan to use [facility] for specialty meds; [facility] otherwise   Medication Refills:  n/a  Medication Access:  n/a    Adherence: Patient has poor understanding of medications; was not able to independently identify names/doses of immunosuppressants and OI meds.  Patient  does not fill their own pill box on a regular basis at home. (not filled since discharged from txp)  Patient brought medication card:no  Pill box:did not bring  Plan: become more familiar with medications; provided extensive adherence counseling/intervention    Patient was reviewed with Dr. Norma Fredrickson who was agreement with the stated plan:     During this visit, the following was completed:   BG log data assessment  BP log data assessment  Labs ordered and evaluated  complex treatment plan >1 DS   I spent a total of 45 minutes face to face with the patient delivering clinical care and providing education/counseling.    All questions/concerns were addressed to the patient's satisfaction.  __________________________________________  Erven Colla, PharmD [phone]    .Olivia Mackie, PharmD, BCTXP, BCPS, CPP  Solid Organ Transplant Clinical Pharmacist Practitioner

## 2020-12-13 NOTE — Unmapped (Incomplete)
The patient reports they are currently: at home. I spent 30 minutes on the phone with the patient on the date of service. I spent an additional 15 minutes on pre- and post-visit activities on the date of service.     The patient was physically located in [place] or a state in which I am permitted to provide care. The patient and/or parent/guardian understood that s/he may incur co-pays and cost sharing, and agreed to the telemedicine visit. The visit was reasonable and appropriate under the circumstances given the patient's presentation at the time.    The patient and/or parent/guardian has been advised of the potential risks and limitations of this mode of treatment (including, but not limited to, the absence of in-person examination) and has agreed to be treated using telemedicine. The patient's/patient's family's questions regarding telemedicine have been answered.     If the visit was completed in an ambulatory setting, the patient and/or parent/guardian has also been advised to contact their provider???s office for worsening conditions, and seek emergency medical treatment and/or call 911 if the patient deems either necessary.      **THIS PATIENT WAS NOT SEEN IN PERSON TO MINIMIZE POTENTIAL SPREAD OF COVID-19, PROTECT PATIENTS/PROVIDERS, AND REDUCE PPE UTILIZATION.**    PATIENT NAME: Nicholas Strong     MR#: [id number]    DOB: [birth year]    [facility]  CONFIDENTIAL SOCIAL WORK   INITIAL KIDNEY POST TRANSPLANT FOLLOW UP      DATE OF EVALUATION: 12/13/2020    INFORMANTS: Patient/Nicholas Strong    PREFERRED LANGUAGE: English     INTERPRETER UTILIZED: N/A    TXP CARE TEAM:   Post Transplant RN CoordinatorLaury Deep [phone]  Primary Transplant Provider: Bufford Buttner, Anibal Henderson, [facility], Sorabh Kapoor, Edwin Mosetta Putt, Leafy Half, [place] [facility]    REFERRAL INFORMATION:    Mr.  Nicholas Strong is a 55 y.o.  male is s/p transplant for kidney transplantation . CSW follows up to assess recovery since DC.    TRANSPLANT DATE:   12/04/2020 (Kidney)    MOST RECENT HOSPITAL ADMISSION (@ [place]):   Previous admit date: 12/03/2020 to 12/09/20    FUTURE APPOINTMENTS (@ [place]):   Future Appointments   Date Time Provider Department Center   12/13/2020 11:30 AM Eliezer Bottom, LCSW [facility]   12/14/2020 10:00 AM LAB PHLEB GRND [facility]   12/14/2020 11:30 AM Gemma Payor, MD [facility]   12/14/2020 12:00 PM TRANSPLANT SURGERY PHARMACY [facility]   12/27/2020  9:00 AM [place] LAB ONLY [facility]   12/27/2020  9:20 AM KIDNEY TRANSPLANT PHARMACY [facility]   12/27/2020 10:00 AM Leafy Half, MD Dennie Fet[facility]   01/15/2021  1:45 PM Hung-Jui Jodie Echevaria, MD UROPROC[facility]H [facility]       HOME HEALTH/DME NEEDS AT LAST DC:   HH: N/A   Services: N/A   Contact: N/A  DME: N/A, prosthetic right leg  Other: N/A  Other Remaining:   ??? Ureter stent: Yes  ??? Staples: Yes  ??? Surgical drains x2: Yes; still draining some  ??? PD Cath: yes  ??? Central line: N/A       LIFESTYLE:  Physical activity:  Good  Nutrition/Appetite:  Good  Sleep: Fair, frequent bathroom trips    SOCIAL HISTORY & CAREGIVING PLAN:  Citizenship Status: [place] Citizen  Social History: originally from [place]. Patient moved to [place] about 5 years ago.  Marital Status: married  Lives with: spouse/[name]  Children/Dependents:  two adult daughters (one in [place], [place] and one in [place]). Also has a granddaughter.    Supports: Daughter and wife are two main supports. Patient's mother is deceased. Dad passed away couple of years ago.    Living situation: Apartment  Condition of home: good repair    Transportation: wife/[name]    Pt reports that his wife works FT for the post office and has taken this week off from work.  He is unclear about his CG plan after that time.    SUMMARY:  Pt was very talkative today and had many good questions regarding his recovery.  His wife was not present and he felt that she would benefit from hearing education from today, especially about the need for strong support following surgery.    Agreed to schedule another time to talk this week when his wife could be present.  Plan to complete assessment at that time.      Anticipatory guidance/education provided on the following:  {jenn anticipatory guide options:89225        RECOMMENDATIONS:   1. Plan to call again on 9/16 @ 10am to complete  2. Wife to be present on 9/16  3. Reiterate need for strong support following surgery.        Lowella Petties, LCSW, CCTSW  Transplant Case Manager/Social Worker  [facility] for Transplant Care  Completed: 12/13/20 ***    SUMMARY:  ***      Anticipatory guidance/education provided on the following:  {jenn anticipatory guide options:89225}        RECOMMENDATIONS:   1. ***  2. ***        ***, LCSW, CCTSW  Transplant Case Manager/Social Worker  [facility] for Transplant Care  Completed: 12/13/20

## 2020-12-14 ENCOUNTER — Ambulatory Visit: Admit: 2020-12-14 | Discharge: 2020-12-15 | Payer: MEDICARE

## 2020-12-14 ENCOUNTER — Ambulatory Visit
Admit: 2020-12-14 | Discharge: 2020-12-15 | Payer: MEDICARE | Attending: Student in an Organized Health Care Education/Training Program | Primary: Student in an Organized Health Care Education/Training Program

## 2020-12-14 ENCOUNTER — Institutional Professional Consult (permissible substitution): Admit: 2020-12-14 | Discharge: 2020-12-15 | Payer: MEDICARE

## 2020-12-14 DIAGNOSIS — Z79899 Other long term (current) drug therapy: Secondary | ICD-10-CM | POA: Diagnosis not present

## 2020-12-14 DIAGNOSIS — Z940 Kidney transplant status: Secondary | ICD-10-CM | POA: Diagnosis not present

## 2020-12-14 LAB — DECEASED DONOR CL I&II, LOW RES
DONOR LOW RES HLA A #1: 2
DONOR LOW RES HLA A #2: 2
DONOR LOW RES HLA B #1: 62
DONOR LOW RES HLA B #2: 57
DONOR LOW RES HLA BW #1: 4
DONOR LOW RES HLA BW #2: 6
DONOR LOW RES HLA C #1: 10
DONOR LOW RES HLA C #2: 6
DONOR LOW RES HLA DQ #1: 9
DONOR LOW RES HLA DQ #2: 5
DONOR LOW RES HLA DR #1: 1
DONOR LOW RES HLA DR #2: 7

## 2020-12-14 LAB — RENAL FUNCTION PANEL
ALBUMIN: 3.6 g/dL (ref 3.4–5.0)
ANION GAP: 5 mmol/L (ref 5–14)
BLOOD UREA NITROGEN: 42 mg/dL — ABNORMAL HIGH (ref 9–23)
BUN / CREAT RATIO: 15
CALCIUM: 9.1 mg/dL (ref 8.7–10.4)
CHLORIDE: 112 mmol/L — ABNORMAL HIGH (ref 98–107)
CO2: 24 mmol/L (ref 20.0–31.0)
CREATININE: 2.75 mg/dL — ABNORMAL HIGH
EGFR CKD-EPI (2021) MALE: 26 mL/min/{1.73_m2} — ABNORMAL LOW (ref >=60)
GLUCOSE RANDOM: 169 mg/dL — ABNORMAL HIGH (ref 70–99)
PHOSPHORUS: 3.1 mg/dL (ref 2.4–5.1)
POTASSIUM: 5.4 mmol/L — ABNORMAL HIGH (ref 3.4–4.8)
SODIUM: 141 mmol/L (ref 135–145)

## 2020-12-14 LAB — MAGNESIUM: MAGNESIUM: 1.2 mg/dL — ABNORMAL LOW (ref 1.6–2.6)

## 2020-12-14 LAB — CBC W/ AUTO DIFF
BASOPHILS ABSOLUTE COUNT: 0 10*9/L (ref 0.0–0.1)
BASOPHILS RELATIVE PERCENT: 0.4 %
EOSINOPHILS ABSOLUTE COUNT: 0.2 10*9/L (ref 0.0–0.5)
EOSINOPHILS RELATIVE PERCENT: 2.1 %
HEMATOCRIT: 31.2 % — ABNORMAL LOW (ref 39.0–48.0)
HEMOGLOBIN: 9.9 g/dL — ABNORMAL LOW (ref 12.9–16.5)
LYMPHOCYTES ABSOLUTE COUNT: 0.1 10*9/L — ABNORMAL LOW (ref 1.1–3.6)
LYMPHOCYTES RELATIVE PERCENT: 1.4 %
MEAN CORPUSCULAR HEMOGLOBIN CONC: 31.8 g/dL — ABNORMAL LOW (ref 32.0–36.0)
MEAN CORPUSCULAR HEMOGLOBIN: 27.9 pg (ref 25.9–32.4)
MEAN CORPUSCULAR VOLUME: 87.6 fL (ref 77.6–95.7)
MEAN PLATELET VOLUME: 8.1 fL (ref 6.8–10.7)
MONOCYTES ABSOLUTE COUNT: 0.6 10*9/L (ref 0.3–0.8)
MONOCYTES RELATIVE PERCENT: 7.7 %
NEUTROPHILS ABSOLUTE COUNT: 6.9 10*9/L (ref 1.8–7.8)
NEUTROPHILS RELATIVE PERCENT: 88.4 %
PLATELET COUNT: 206 10*9/L (ref 150–450)
RED BLOOD CELL COUNT: 3.56 10*12/L — ABNORMAL LOW (ref 4.26–5.60)
RED CELL DISTRIBUTION WIDTH: 16.7 % — ABNORMAL HIGH (ref 12.2–15.2)
WBC ADJUSTED: 7.8 10*9/L (ref 3.6–11.2)

## 2020-12-14 LAB — TACROLIMUS LEVEL: TACROLIMUS BLOOD: 12.7 ng/mL

## 2020-12-14 LAB — MISC LABCORP TEST (SEND OUT): Labcorp test code: 550475

## 2020-12-14 MED ORDER — INSULIN GLARGINE (U-100) 100 UNIT/ML (3 ML) SUBCUTANEOUS PEN
Freq: Every evening | SUBCUTANEOUS | 11 refills | 26.00000 days | Status: CP
Start: 2020-12-14 — End: ?

## 2020-12-14 MED ORDER — CHLORTHALIDONE 25 MG TABLET
ORAL_TABLET | Freq: Every morning | ORAL | 11 refills | 30 days | Status: CP
Start: 2020-12-14 — End: 2021-12-14

## 2020-12-14 MED ORDER — MG-PLUS-PROTEIN 133 MG TABLET
ORAL_TABLET | Freq: Two times a day (BID) | ORAL | 11 refills | 50 days | Status: CP
Start: 2020-12-14 — End: ?

## 2020-12-14 NOTE — Unmapped (Signed)
Transplant Coordinator, Clinic Visit   Pt seen today by transplant nephrology for follow up, reviewed medications and symptoms.          12/14/20 1047   BP: 176/77   Pulse: 62   Temp: 36.2 ??C (97.2 ??F)   Weight: (!) 109 kg (240 lb 4.8 oz)   PainSc: 0-No pain     Patient presents today feeling well overall.  Discussed plan for continuing lab draws from right upper extremity going forward.  Patient's blood pressures at home in 140's/70's.  Patient notes mild swelling to left lower extremity.  He endorses decreased appetite.  Both JP drains removed.  PO Magnesium started for low magnesium levels and patient's BP meds increased.      Assessment  BP: 140's/70's  Lightheaded: denies  BG:  140's - 200's   Headache: Denies  Hand tremors: mild  Numbness/tingling: denies  Fevers: denies  Chills/sweats: denies  Shortness of breath: denies  Chest pain or pressure: none  Palpitations: none  Abdominal pain: denies  Heart burn: denies  Nausea/vomiting: denies  Diarrhea/constipation: denies  UTI symptoms: none  Swelling: none  Sleep: sleeping better, less frequent urination  Pain: denies  Incision: intake, staples in place  Drain: removed today, JP x 2      Good appetite; reports adequate hydration.     Intake: reports adequate, not currently recording, encouraged to record numbers  Output: adequate    Any new medications? none  Immunosuppressant last taken: 2100 9/14    Immunization status: up to date    Functional Score: 90     Able to carry on normal activity;  Minor signs or symptoms of disease.      I spent a total of 20 minutes with Nicholas Strong reviewing medications and symptoms.

## 2020-12-14 NOTE — Unmapped (Signed)
Please see patient pharmacy visit for documentation.

## 2020-12-14 NOTE — Unmapped (Signed)
TRANSPLANT SURGERY PROGRESS NOTE    Assessment and Plan  Nicholas Strong??is a 55 y.o.??male??with??history of HTN, ESRD secondary to diabetes mellitus??Polyneuropathy,??Renal Stone,??and Renal Cell Ca (S/p Left nephrectomy) who is s/p R kidney allotransplant on 12/04/2020. He is progressing well post-transplant with no pain reported with down-trending Cr (5.7>2.7) and BUN (74>42). Steady serosanguinous drainage from JP drains~111ml/day. Frequent urination ~15x/day that has streamlined to 10x/day with light yellow urine. Denies any concerns at this time and all questions were answered.     Plan:  - Remove JP drains  - Start on Hydralazine for elevated BP  - 1 week f/u  - Post transplant immunosuppression.     Subjective  Nicholas Strong??is a 55 y.o.??male??with??history of HTN, ESRD secondary to diabetes mellitus??Polyneuropathy,??Renal Stone,??and Renal Cell Ca (S/p Left nephrectomy) who is s/p R kidney allotransplant on 12/04/2020 who presents for a follow-up appointment at the transplant clinic. His post-op period has been uneventful and is progressing well. Notes frequent urination of 15x/day that has streamlined to 10x/day with about of light yellow urine per void. His drains have seosanguinous output of about per day. Denies any flank pain or abdominal pain and no episodes of nausea or emesis. Incision clean, dry and intact with no drainage noted. His BP has been stable around 150's/80's and blood glucose ranging from 200-250 with 40 units of insulin in the morning. Good intake of fluids noted. Denies chest pain, SOB, N/V/D, abdominal pain, headaches.       Objective    Vitals:    12/14/20 1047   BP: 176/77   Pulse: 62   Temp: 36.2 ??C (97.2 ??F)   Weight: (!) 109 kg (240 lb 4.8 oz)      Body mass index is 36.55 kg/m??.     Physical Exam:    General Appearance:   No acute distress  Lungs:                Clear to auscultation bilaterally  Heart:                           Regular rate and rhythm  Abdomen: Soft, non-tender, non-distended, post-op incision c/d/i. Both the JP drains removed at clinic.   Extremities:              Warm and well perfused. Prosthetic right leg.         Data Review:  All lab results last 24 hours:    Recent Results (from the past 24 hour(s))   Renal Function Panel    Collection Time: 12/14/20  9:50 AM   Result Value Ref Range    Sodium 141 135 - 145 mmol/L    Potassium 5.4 (H) 3.4 - 4.8 mmol/L    Chloride 112 (H) 98 - 107 mmol/L    CO2 24.0 20.0 - 31.0 mmol/L    Anion Gap 5 5 - 14 mmol/L    BUN 42 (H) 9 - 23 mg/dL    Creatinine 1.61 (H) 0.60 - 1.10 mg/dL    BUN/Creatinine Ratio 15     eGFR CKD-EPI (2021) Male 26 (L) >=60 mL/min/1.64m2    Glucose 169 (H) 70 - 99 mg/dL    Calcium 9.1 8.7 - 09.6 mg/dL    Phosphorus 3.1 2.4 - 5.1 mg/dL    Albumin 3.6 3.4 - 5.0 g/dL   Magnesium Level    Collection Time: 12/14/20  9:50 AM   Result Value Ref Range  Magnesium 1.2 (L) 1.6 - 2.6 mg/dL   CBC w/ Differential    Collection Time: 12/14/20  9:50 AM   Result Value Ref Range    WBC 7.8 3.6 - 11.2 10*9/L    RBC 3.56 (L) 4.26 - 5.60 10*12/L    HGB 9.9 (L) 12.9 - 16.5 g/dL    HCT 29.5 (L) 62.1 - 48.0 %    MCV 87.6 77.6 - 95.7 fL    MCH 27.9 25.9 - 32.4 pg    MCHC 31.8 (L) 32.0 - 36.0 g/dL    RDW 30.8 (H) 65.7 - 15.2 %    MPV 8.1 6.8 - 10.7 fL    Platelet 206 150 - 450 10*9/L    Neutrophils % 88.4 %    Lymphocytes % 1.4 %    Monocytes % 7.7 %    Eosinophils % 2.1 %    Basophils % 0.4 %    Absolute Neutrophils 6.9 1.8 - 7.8 10*9/L    Absolute Lymphocytes 0.1 (L) 1.1 - 3.6 10*9/L    Absolute Monocytes 0.6 0.3 - 0.8 10*9/L    Absolute Eosinophils 0.2 0.0 - 0.5 10*9/L    Absolute Basophils 0.0 0.0 - 0.1 10*9/L    Anisocytosis Slight (A) Not Present         Imaging:    12/04/2020: Renal US Transplant  Elevated velocity in the main renal artery at the level of the anastomosis measuring 3.9 m/s, likely secondary to postoperative edema. Otherwise, patent renal transplant vasculature with normal resistive indices.

## 2020-12-15 ENCOUNTER — Institutional Professional Consult (permissible substitution): Admit: 2020-12-15 | Discharge: 2020-12-16 | Payer: MEDICARE

## 2020-12-15 LAB — TACROLIMUS LEVEL: Tacrolimus (FK506) - LabCorp: 16 ng/mL (ref 2.0–20.0)

## 2020-12-15 MED ORDER — OZEMPIC 0.25 MG OR 0.5 MG (2 MG/1.5 ML) SUBCUTANEOUS PEN INJECTOR
SUBCUTANEOUS | 11 refills | 28 days | Status: CP
Start: 2020-12-15 — End: 2021-12-15

## 2020-12-15 MED ORDER — LEVOTHYROXINE 75 MCG TABLET
ORAL_TABLET | Freq: Every day | ORAL | 11 refills | 30 days | Status: CP
Start: 2020-12-15 — End: 2021-12-15

## 2020-12-15 MED ORDER — CHLORTHALIDONE 25 MG TABLET
ORAL_TABLET | Freq: Every morning | ORAL | 11 refills | 30 days | Status: CP
Start: 2020-12-15 — End: 2021-12-15
  Filled 2020-12-14: qty 30, 30d supply, fill #0

## 2020-12-15 MED ORDER — INSULIN LISPRO (U-100) 100 UNIT/ML SUBCUTANEOUS PEN
Freq: Three times a day (TID) | SUBCUTANEOUS | 11 refills | 42 days | Status: CP
Start: 2020-12-15 — End: ?

## 2020-12-15 MED ORDER — AMLODIPINE 10 MG TABLET
ORAL_TABLET | Freq: Every day | ORAL | 11 refills | 30 days | Status: CP
Start: 2020-12-15 — End: ?

## 2020-12-15 MED ORDER — MG-PLUS-PROTEIN 133 MG TABLET
ORAL_TABLET | Freq: Two times a day (BID) | ORAL | 11 refills | 50 days | Status: CP
Start: 2020-12-15 — End: ?

## 2020-12-15 MED ORDER — ASPIRIN 81 MG TABLET,DELAYED RELEASE
ORAL_TABLET | Freq: Every day | ORAL | 11 refills | 30 days | Status: CP
Start: 2020-12-15 — End: 2021-12-15

## 2020-12-15 MED ORDER — ACYCLOVIR 400 MG TABLET
ORAL_TABLET | Freq: Two times a day (BID) | ORAL | 2 refills | 30 days | Status: CP
Start: 2020-12-15 — End: ?

## 2020-12-15 MED ORDER — TACROLIMUS 1 MG CAPSULE, IMMEDIATE-RELEASE
ORAL_CAPSULE | ORAL | 11 refills | 30.00000 days | Status: CP
Start: 2020-12-15 — End: 2020-12-15

## 2020-12-15 MED ORDER — FAMOTIDINE 20 MG TABLET
ORAL_TABLET | Freq: Every day | ORAL | 11 refills | 30 days | Status: CP
Start: 2020-12-15 — End: ?

## 2020-12-15 MED ORDER — INSULIN GLARGINE (U-100) 100 UNIT/ML (3 ML) SUBCUTANEOUS PEN
Freq: Every evening | SUBCUTANEOUS | 11 refills | 26 days | Status: CP
Start: 2020-12-15 — End: ?

## 2020-12-15 MED ORDER — ACETAMINOPHEN 500 MG TABLET
ORAL_TABLET | Freq: Four times a day (QID) | ORAL | 11 refills | 8 days | Status: CP | PRN
Start: 2020-12-15 — End: ?

## 2020-12-15 MED ORDER — SULFAMETHOXAZOLE 400 MG-TRIMETHOPRIM 80 MG TABLET
ORAL_TABLET | ORAL | 5 refills | 28 days | Status: CP
Start: 2020-12-15 — End: ?

## 2020-12-15 MED ORDER — CARVEDILOL 25 MG TABLET
ORAL_TABLET | Freq: Two times a day (BID) | ORAL | 11 refills | 30 days | Status: CP
Start: 2020-12-15 — End: ?

## 2020-12-15 MED ORDER — ROSUVASTATIN 10 MG TABLET
ORAL_TABLET | Freq: Every day | ORAL | 11 refills | 30 days | Status: CP
Start: 2020-12-15 — End: ?

## 2020-12-15 MED ORDER — CYANOCOBALAMIN (VIT B-12) 100 MCG TABLET
ORAL_TABLET | Freq: Every day | ORAL | 11 refills | 100 days | Status: CP
Start: 2020-12-15 — End: ?

## 2020-12-15 MED ORDER — MYCOPHENOLATE SODIUM 180 MG TABLET,DELAYED RELEASE
ORAL_TABLET | Freq: Two times a day (BID) | ORAL | 11 refills | 30.00000 days | Status: CP
Start: 2020-12-15 — End: 2021-12-15

## 2020-12-15 MED ORDER — NITROGLYCERIN 0.4 MG SUBLINGUAL TABLET
ORAL_TABLET | SUBLINGUAL | 1 refills | 8 days | Status: CP | PRN
Start: 2020-12-15 — End: ?

## 2020-12-15 NOTE — Unmapped (Signed)
The patient reports they are currently: at home. I spent 45 minutes on the phone with the patient on the date of service. I spent an additional 20 minutes on pre- and post-visit activities on the date of service.     The patient was physically located in [place] or a state in which I am permitted to provide care. The patient and/or parent/guardian understood that s/he may incur co-pays and cost sharing, and agreed to the telemedicine visit. The visit was reasonable and appropriate under the circumstances given the patient's presentation at the time.    The patient and/or parent/guardian has been advised of the potential risks and limitations of this mode of treatment (including, but not limited to, the absence of in-person examination) and has agreed to be treated using telemedicine. The patient's/patient's family's questions regarding telemedicine have been answered.     If the visit was completed in an ambulatory setting, the patient and/or parent/guardian has also been advised to contact their provider???s office for worsening conditions, and seek emergency medical treatment and/or call 911 if the patient deems either necessary.      **THIS PATIENT WAS NOT SEEN IN PERSON TO MINIMIZE POTENTIAL SPREAD OF COVID-19, PROTECT PATIENTS/PROVIDERS, AND REDUCE PPE UTILIZATION.**    PATIENT NAME: Nicholas Strong     MR#: [id number]    DOB: 12/07/1965    [facility]  CONFIDENTIAL SOCIAL WORK   INITIAL KIDNEY POST TRANSPLANT FOLLOW UP      DATE OF EVALUATION: 12/15/2020    INFORMANTS: Patient/Dashun Dimas Aguas, spouse/[name] Roseanne Reno    PREFERRED LANGUAGE: English     INTERPRETER UTILIZED: N/A    TXP CARE TEAM:   Post Transplant RN CoordinatorLaury Deep [phone]  Primary Transplant Provider: Bufford Buttner, Anibal Henderson, [facility], Sorabh Kapoor, Edwin Mosetta Putt, Leafy Half, Braggs Y Dark    REFERRAL INFORMATION:    Mr.  Morais is a 55 y.o. male is s/p transplant for kidney transplantation . CSW follows up to assess recovery since DC.    TRANSPLANT DATE:   12/04/2020 (Kidney)    MOST RECENT HOSPITAL ADMISSION (@ [facility]):   Previous admit date: 12/03/2020 to 12/09/20    FUTURE APPOINTMENTS (@ [facility]):   Future Appointments   Date Time Provider Department Center   12/21/2020  9:00 AM Lilyan Punt Paragon, MD [facility]   12/27/2020  9:00 AM [facility] ONLY [facility]   12/27/2020  9:20 AM KIDNEY TRANSPLANT PHARMACY [facility]   12/27/2020 10:00 AM Leafy Half, MD [facility]   01/15/2021  1:45 PM Hung-Jui Jodie Echevaria, MD [facility]       HOME HEALTH/DME NEEDS AT LAST DC:   HH: N/A   Services: N/A   Contact: N/A  DME:  prothestic right leg/2016--[employer]*  Other: N/A  Other Remaining:   ??? Ureter stent: Yes  ??? Staples: Yes  ??? Surgical drains x0: removed 12/14/20   ??? PD Cath: Yes,  ??? Central line: N/A     LIFESTYLE:  Physical activity:  Good  Nutrition/Appetite:  Good  Sleep: Fair; interrupted by frequent urination    SOCIAL HISTORY & CAREGIVING PLAN:  Citizenship Status: [place] Citizen  Social History: originally from [place]; moved to [place] ~2015  Marital Status: married  Lives with: spouse/[name]  Children/Dependents:  two adult daughters (one in [place], [place] and one in [place]). Also has a granddaughter.    Supports: parents/deceased  Living situation: Apartment, good  repair  Transportation: wife/[name]    Pt reports that wife/[name] has been his primary caregiver and has taken PTO.  She has not explored FMLA to date.  Appears that there is not a clear plan for caregiving for next week?  Reiterated need for 24/7 care for first few weeks, with additional care (though not 242/7) and transportation assistance needed in the weeks following.      Wife was present for this discussion.  She will explore FMLA w/ her employer (post [employer]) and get back to this CSW.  Also discussed possibility of financial assistance if needed.    COMPLIANCE HISTORY:  Medication Adherence: Good; feel coming along  Medication Concerns: denied problems taking medications, concerns about side effects, affordability, problems obtaining medications, and difficulty remembering medications  Other Adherence: Good     Side Effects: none     Pt reports some issues reading the labels on his medications from [facility] as they are laid out in an unfamiliar way.  He feels that he is doing OK, but did have some questions.  This CSW was able to reach txp pharm via secure chat and they were in agreement to have someone call him later today to discuss.    INSURANCE:  [employer] assistance: N/A  Payer/Plan Subscriber Name Rel Member # Group #   [employer]* C[place]TON, BOSSERMAN Self [id number] [phone]      [address] [address]   MEDICAID [place] - MEDICAI* [facility] Self [id number] P       [id number]       INCOME:   Pt/SSDI; reports that his benefits predate HD and were based mostly on issues with his right leg and ultimate amputation.  Pt states that issues began in 2008 with a nonhealing hairline fracture.  He eventually needed ~7-8 different surgeries over the years, was unable to work, and finally had to have the leg amputated.    Wife/employed FT w/ [employer].  She has not applied for FMLA but will explore and let this CSW know if needed.    ATTITUDE ABOUT TRANSPLANT:  Expectations: I guess so...; I've been waiting for this for 6 years.  My goal was just to stop dialysis and that is now out of the way.  A new journey has begun [for me]...  Fears/Concerns: denies, but admits to being impatient during his recovery  What would you change?:  none  Rec'd Info on Ltr to Donor Family: yes; considering    MENTAL HEALTH HISTORY: reflective of current   Current issues/mood: pt feels that he has been OK; wife reports increased irritability in the last week; pt feels that he had intermittent issues w/ depression while on dialysis  Medications: denies  Therapy: denies  SI/HI: denies    SUBSTANCE HISTORY: reflective of current   Tobacco: denies  Alcohol: denies  Illicit Substances: denies  OTC/Supplements: denies    PAIN HISTORY: reflective of current  Current : managemable  Current use of pain medication/pain control: denies use of any pain med, including Tylenol, since DC; feels that he has developed a high pain tolerance over the years    SUMMARY:  Both pt and wife were present for this call and validated all information.  Reviewed a good deal of anticipatory guidance and reiterated need for caregiving and limitations post transplant.  Cautioned pt against allowing his impatience to ultimately hinder his recovery by pushing too hard.  Both pt and wife indicated that they agreed.  Anticipatory guidance/education provided on the following:  --Possibility of readmissions, unplanned clinic visits, extra lab work  --Gradual decline in frequency of clinic visits and lab work over time  --Importance of communication with txp team  --Additional infection control precautions during acute recovery phase (and ongoing)  --Additional physical & lifting restrictions (including driving) during acute recovery phase  --Normalization of complexity of care, frequently changing medications, and other details to keep organized  --Normalization of need for support/assistance post transplant for ALL patients  --Normalization of frequent urge/need to urinate, including possibility of incontinent accidents   --Possibility of mood changes, including when to call providers         RECOMMENDATIONS:   1. F/up  2 weeks  2. F/up w/ wife re: FMLA?  3. Monitor finances and need for financial assistance?  4. Verified contact info for this CSW    Lowella Petties, LCSW, CCTSW  Transplant Case Manager/Social Worker  [facility] for [facility]plant Care  Completed: 12/15/20

## 2020-12-15 NOTE — Unmapped (Signed)
Mycophenolate RFTS 09/29  Tacrolimus RFTS 10/02    Will be testing on respective dates and will then provide onboarding info.

## 2020-12-15 NOTE — Unmapped (Signed)
Onboarding call set up for patient today, however he is in clinic and new rxs being sent over.  Will wait for triage test claims to contact patient with updated pricing and doses.

## 2020-12-16 LAB — HCV RNA QUANT RFLX ULTRA OR GENOTYP
HCV RNA Qnt(log copy/mL): 5.783 log10 IU/mL
HepC Qn: 607000 IU/mL

## 2020-12-16 LAB — HEPATITIS C GENOTYPE: Hepatitis C Genotype: 3

## 2020-12-18 ENCOUNTER — Other Ambulatory Visit (HOSPITAL_COMMUNITY)
Admission: RE | Admit: 2020-12-18 | Discharge: 2020-12-18 | Disposition: A | Discharge status: Home / Self Care | Payer: Medicare Other | Source: Ambulatory Visit | Attending: Pediatric Nephrology | Admitting: Pediatric Nephrology

## 2020-12-18 DIAGNOSIS — D631 Anemia in chronic kidney disease: Secondary | ICD-10-CM | POA: Diagnosis not present

## 2020-12-18 DIAGNOSIS — Z940 Kidney transplant status: Secondary | ICD-10-CM | POA: Insufficient documentation

## 2020-12-18 DIAGNOSIS — Z789 Other specified health status: Secondary | ICD-10-CM | POA: Diagnosis not present

## 2020-12-18 DIAGNOSIS — N189 Chronic kidney disease, unspecified: Secondary | ICD-10-CM | POA: Insufficient documentation

## 2020-12-18 DIAGNOSIS — B259 Cytomegaloviral disease, unspecified: Secondary | ICD-10-CM | POA: Insufficient documentation

## 2020-12-18 DIAGNOSIS — Z114 Encounter for screening for human immunodeficiency virus [HIV]: Secondary | ICD-10-CM | POA: Diagnosis not present

## 2020-12-18 DIAGNOSIS — E1122 Type 2 diabetes mellitus with diabetic chronic kidney disease: Secondary | ICD-10-CM | POA: Diagnosis not present

## 2020-12-18 DIAGNOSIS — Z9483 Pancreas transplant status: Secondary | ICD-10-CM | POA: Insufficient documentation

## 2020-12-18 DIAGNOSIS — N390 Urinary tract infection, site not specified: Secondary | ICD-10-CM | POA: Diagnosis not present

## 2020-12-18 DIAGNOSIS — Z09 Encounter for follow-up examination after completed treatment for conditions other than malignant neoplasm: Secondary | ICD-10-CM | POA: Diagnosis not present

## 2020-12-18 DIAGNOSIS — D899 Disorder involving the immune mechanism, unspecified: Secondary | ICD-10-CM | POA: Insufficient documentation

## 2020-12-18 LAB — BASIC METABOLIC PANEL
Anion gap: 5 (ref 5–15)
BUN: 41 mg/dL — ABNORMAL HIGH (ref 6–20)
CO2: 23 mmol/L (ref 22–32)
Calcium: 9.2 mg/dL (ref 8.9–10.3)
Chloride: 112 mmol/L — ABNORMAL HIGH (ref 98–111)
Creatinine, Ser: 2.76 mg/dL — ABNORMAL HIGH (ref 0.61–1.24)
GFR, Estimated: 26 mL/min — ABNORMAL LOW (ref >60)
Glucose, Bld: 195 mg/dL — ABNORMAL HIGH (ref 70–99)
Potassium: 5.6 mmol/L — ABNORMAL HIGH (ref 3.5–5.1)
Sodium: 140 mmol/L (ref 135–145)

## 2020-12-18 LAB — CBC WITH DIFFERENTIAL/PLATELET
Abs Immature Granulocytes: 0.07 10*3/uL (ref 0.00–0.07)
Basophils Absolute: 0 10*3/uL (ref 0.0–0.1)
Basophils Relative: 0 %
Eosinophils Absolute: 0.2 10*3/uL (ref 0.0–0.5)
Eosinophils Relative: 2 %
HCT: 31.1 % — ABNORMAL LOW (ref 39.0–52.0)
Hemoglobin: 9.2 g/dL — ABNORMAL LOW (ref 13.0–17.0)
Immature Granulocytes: 1 %
Lymphocytes Relative: 3 %
Lymphs Abs: 0.3 10*3/uL — ABNORMAL LOW (ref 0.7–4.0)
MCH: 27.8 pg (ref 26.0–34.0)
MCHC: 29.6 g/dL — ABNORMAL LOW (ref 30.0–36.0)
MCV: 94 fL (ref 80.0–100.0)
Monocytes Absolute: 0.7 10*3/uL (ref 0.1–1.0)
Monocytes Relative: 7 %
Neutro Abs: 9 10*3/uL — ABNORMAL HIGH (ref 1.7–7.7)
Neutrophils Relative %: 87 %
Platelets: 227 10*3/uL (ref 150–400)
RBC: 3.31 MIL/uL — ABNORMAL LOW (ref 4.22–5.81)
RDW: 15.8 % — ABNORMAL HIGH (ref 11.5–15.5)
WBC: 10.2 10*3/uL (ref 4.0–10.5)
nRBC: 0 % (ref 0.0–0.2)

## 2020-12-18 LAB — PHOSPHORUS: Phosphorus: 3.6 mg/dL (ref 2.5–4.6)

## 2020-12-18 LAB — MAGNESIUM: Magnesium: 1.6 mg/dL — ABNORMAL LOW (ref 1.7–2.4)

## 2020-12-19 DIAGNOSIS — Z940 Kidney transplant status: Principal | ICD-10-CM

## 2020-12-19 DIAGNOSIS — R82998 Other abnormal findings in urine: Principal | ICD-10-CM

## 2020-12-19 DIAGNOSIS — Z89511 Acquired absence of right leg below knee: Secondary | ICD-10-CM | POA: Diagnosis not present

## 2020-12-20 ENCOUNTER — Other Ambulatory Visit (HOSPITAL_COMMUNITY)
Admission: RE | Admit: 2020-12-20 | Discharge: 2020-12-20 | Disposition: A | Discharge status: Home / Self Care | Payer: Medicare Other | Source: Ambulatory Visit | Attending: Pediatric Nephrology | Admitting: Pediatric Nephrology

## 2020-12-20 DIAGNOSIS — E559 Vitamin D deficiency, unspecified: Secondary | ICD-10-CM | POA: Diagnosis not present

## 2020-12-20 DIAGNOSIS — Z114 Encounter for screening for human immunodeficiency virus [HIV]: Secondary | ICD-10-CM | POA: Diagnosis not present

## 2020-12-20 DIAGNOSIS — D631 Anemia in chronic kidney disease: Secondary | ICD-10-CM | POA: Insufficient documentation

## 2020-12-20 DIAGNOSIS — D899 Disorder involving the immune mechanism, unspecified: Secondary | ICD-10-CM | POA: Diagnosis not present

## 2020-12-20 DIAGNOSIS — N189 Chronic kidney disease, unspecified: Secondary | ICD-10-CM | POA: Insufficient documentation

## 2020-12-20 DIAGNOSIS — Z789 Other specified health status: Secondary | ICD-10-CM | POA: Insufficient documentation

## 2020-12-20 DIAGNOSIS — Z940 Kidney transplant status: Secondary | ICD-10-CM | POA: Insufficient documentation

## 2020-12-20 DIAGNOSIS — B259 Cytomegaloviral disease, unspecified: Secondary | ICD-10-CM | POA: Diagnosis not present

## 2020-12-20 DIAGNOSIS — Z79899 Other long term (current) drug therapy: Secondary | ICD-10-CM | POA: Insufficient documentation

## 2020-12-20 DIAGNOSIS — Z9483 Pancreas transplant status: Secondary | ICD-10-CM | POA: Diagnosis not present

## 2020-12-20 DIAGNOSIS — N390 Urinary tract infection, site not specified: Secondary | ICD-10-CM | POA: Insufficient documentation

## 2020-12-20 LAB — BASIC METABOLIC PANEL
ANION GAP: 6
Anion gap: 6 (ref 5–15)
BLOOD UREA NITROGEN: 39 — ABNORMAL HIGH (ref 6–20)
BUN: 39 mg/dL — ABNORMAL HIGH (ref 6–20)
CALCIUM: 9.2 mg/dL (ref 8.9–10.3)
CHLORIDE: 112 mmol/L — ABNORMAL HIGH (ref 98–111)
CO2: 21 mmol/L — ABNORMAL LOW (ref 22–32)
CO2: 21 — ABNORMAL LOW
CREATININE: 2.88 — ABNORMAL HIGH (ref 0.61–1.24)
Calcium: 9.2 mg/dL (ref 8.9–10.3)
Chloride: 112 mmol/L — ABNORMAL HIGH (ref 98–111)
Creatinine, Ser: 2.88 mg/dL — ABNORMAL HIGH (ref 0.61–1.24)
EGFR CKD-EPI AA MALE: 25 mL/min — ABNORMAL LOW
GFR, Estimated: 25 mL/min — ABNORMAL LOW (ref >60)
GLUCOSE RANDOM: 191 mg/dL — ABNORMAL HIGH
Glucose, Bld: 191 mg/dL — ABNORMAL HIGH (ref 70–99)
MAGNESIUM: 1.7 mg/dL (ref 1.7–2.4)
PHOSPHORUS: 4.3 (ref 2.5–4.6)
POTASSIUM: 5.2 mmol/L — ABNORMAL HIGH (ref 3.5–5.1)
Potassium: 5.2 mmol/L — ABNORMAL HIGH (ref 3.5–5.1)
SODIUM: 139 mmol/L (ref 135–145)
Sodium: 139 mmol/L (ref 135–145)

## 2020-12-20 LAB — CBC W/ DIFFERENTIAL
BASOPHILS ABSOLUTE COUNT: 0.1 10*3/uL (ref 0.0–0.1)
BASOPHILS RELATIVE PERCENT: 1 %
EOSINOPHILS ABSOLUTE COUNT: 0.2 10*3/uL — ABNORMAL HIGH (ref 0.0–0)
EOSINOPHILS RELATIVE PERCENT: 2 %
HEMATOCRIT: 31.2 — ABNORMAL LOW (ref 39.0–52.0)
HEMOGLOBIN: 9.4 g/dL — ABNORMAL LOW (ref 13.0–17.01)
IMMATURE CELLS: 1 %
LYMPHOCYTES ABSOLUTE COUNT: 0.3 10*3/uL — ABNORMAL LOW
LYMPHOCYTES RELATIVE PERCENT: 3 %
MEAN CORPUSCULAR HEMOGLOBIN CONC: 30.1 g/dL (ref 30.0–36.0)
MEAN CORPUSCULAR HEMOGLOBIN: 28 pg (ref 26.0–34.0)
MEAN CORPUSCULAR VOLUME: 92.9 fL (ref 80.0–100.0)
MONOCYTES ABSOLUTE COUNT: 0.7
MONOCYTES RELATIVE PERCENT: 7 %
NEUTROPHILS ABSOLUTE COUNT: 8.1 — ABNORMAL HIGH
NEUTROPHILS RELATIVE PERCENT: 86 %
NUCLEATED RED BLOOD CELLS: 0 % (ref 0.0–0.2)
PLATELET COUNT: 207 10*3/uL (ref 150–400)
RED BLOOD CELL COUNT: 3.36 MIL/uL — ABNORMAL LOW
RED CELL DISTRIBUTION WIDTH: 15.9 — ABNORMAL HIGH (ref 11.5–15.5)
WHITE BLOOD CELL COUNT: 9.4 10*3/uL (ref 4.0–10.5)

## 2020-12-20 LAB — HEPATITIS C GENOTYPE: Hepatitis C Genotype: 3

## 2020-12-20 LAB — TACROLIMUS LEVEL: Tacrolimus (FK506) - LabCorp: 12.2 ng/mL (ref 2.0–20.0)

## 2020-12-20 LAB — CBC WITH DIFFERENTIAL/PLATELET
Abs Immature Granulocytes: 0.05 10*3/uL (ref 0.00–0.07)
Basophils Absolute: 0.1 10*3/uL (ref 0.0–0.1)
Basophils Relative: 1 %
Eosinophils Absolute: 0.2 10*3/uL (ref 0.0–0.5)
Eosinophils Relative: 2 %
HCT: 31.2 % — ABNORMAL LOW (ref 39.0–52.0)
Hemoglobin: 9.4 g/dL — ABNORMAL LOW (ref 13.0–17.0)
Immature Granulocytes: 1 %
Lymphocytes Relative: 3 %
Lymphs Abs: 0.3 10*3/uL — ABNORMAL LOW (ref 0.7–4.0)
MCH: 28 pg (ref 26.0–34.0)
MCHC: 30.1 g/dL (ref 30.0–36.0)
MCV: 92.9 fL (ref 80.0–100.0)
Monocytes Absolute: 0.7 10*3/uL (ref 0.1–1.0)
Monocytes Relative: 7 %
Neutro Abs: 8.1 10*3/uL — ABNORMAL HIGH (ref 1.7–7.7)
Neutrophils Relative %: 86 %
Platelets: 207 10*3/uL (ref 150–400)
RBC: 3.36 MIL/uL — ABNORMAL LOW (ref 4.22–5.81)
RDW: 15.9 % — ABNORMAL HIGH (ref 11.5–15.5)
WBC: 9.4 10*3/uL (ref 4.0–10.5)
nRBC: 0 % (ref 0.0–0.2)

## 2020-12-20 LAB — PHOSPHORUS: Phosphorus: 4.3 mg/dL (ref 2.5–4.6)

## 2020-12-20 LAB — MAGNESIUM: Magnesium: 1.7 mg/dL (ref 1.7–2.4)

## 2020-12-20 LAB — HCV RNA QUANT RFLX ULTRA OR GENOTYP
HCV RNA Qnt(log copy/mL): 5.217 log10 IU/mL
HepC Qn: 165000 IU/mL

## 2020-12-20 NOTE — Unmapped (Signed)
This onboarding is for the following medications:  1) Prograf  2) Myfortic        [facility]   Patient Onboarding/Medication Counseling    [name] is a 55 y.o. male with a kidney transplant who I am counseling today on continuation of therapy.  I am speaking to the patient.    Was a Nurse, learning disability used for this call? No    Verified patient's date of birth / HIPAA.    Specialty medication(s) to be sent: Transplant: None- patient has over 2 weeks of mycophenolate and tacrolimus on hand.      Non-specialty medications/supplies to be sent: none      Medications not needed at this time: none       The patient declined counseling on missed dose instructions, goals of therapy, side effects and monitoring parameters, warnings and precautions and storage, handling precautions, and disposal because they have taken the medication previously. The information in the declined sections below are for informational purposes only and was not discussed with patient.     Myfortic (mycophenolic acid)    Medication & Administration     Dosage:   ??? Take 3 tablets (540mg ) two times a day.    Administration:   ??? Take with or without food, although taking with food helps minimize GI side effects.  ??? Swallow the pills whole, do not chew or crush    Adherence/Missed dose instructions:  ??? Take a missed dose as soon as you think about it.  ??? If it is less than 2 hours until your next dose, skip the missed dose and go back to your normal time.  ??? Do not take 2 doses at the same time or extra doses.    Goals of Therapy     ??? To prevent organ rejection    Side Effects & Monitoring Parameters     ??? Common side effects  ??? Back or joint pain  ??? Constipation  ??? Headache/dizziness  ??? Not hungry  ??? Stomach pain, diarrhea, constipation, gas, upset stomach, vomiting, nausea  ??? Feeling tired or weak  ??? Shakiness  ??? Trouble sleeping  ??? Increased risk of infection    ??? The following side effects should be reported to the provider:  ??? Allergic reaction  ??? High blood sugar (confusion, feeling sleepy, more thirst, more hungry, passing urine more often, flushing, fast breathing, or breath that smells like fruit)  ??? Electrolyte issues (mood changes, confusion, muscle pain or weakness, a heartbeat that does not feel normal, seizures, not hungry, or very bad upset stomach or throwing up)  ??? High or low blood pressure (bad headache or dizziness, passing out, or change in eyesight)  ??? Kidney issues (unable to pass urine, change in how much urine is passed, blood in the urine, or a big weight gain)  ??? Skin (oozing, heat, swelling, redness, or pain), UTI and other infections   ??? Chest pain or pressure  ??? Abnormal heartbeat  ??? Unexplained bleeding or bruising  ??? Abnormal burning, numbness, or tingling  ??? Muscle cramps,  ??? Yellowing of skin or eyes    ??? Monitoring parameters  ??? Pregnancy test initially prior to treatment and 8-10 days later then as needed)  ??? CBC weekly for first month then twice monthly for next 2 months, then monthly)  ??? Monitor Renal and liver functions  ??? Signs of organ rejection    Contraindications, Warnings, & Precautions     ??? *This is  a REMS drug and an FDA-approved patient medication guide will be printed with each dispensation  ??? Black Box Warning: Infections   ??? Black Box Warning: Lymphoproliferative disorders - risk of development of lymphoma and skin malignancy is increased  ??? Black Box Warning: Use during pregnancy is associated with increased risks of first trimester pregnancy loss and congenital malformations.   ??? Black Box Warning: Females of reproductive potential should use contraception during treatment and for 6 weeks after therapy is discontinued  ??? CNS depression  ??? New or reactivated viral infections  ??? Neutropenia  ??? Male patients and/or their male partners should use effective contraception during treatment of the male patient and for at least 3 months after last dose.  ??? Breastfeeding is not recommended during therapy and for 6 weeks after last dose    Drug/Food Interactions     ??? Medication list reviewed in Epic. The patient was instructed to inform the care team before taking any new medications or supplements. No drug interactions identified.   ??? Do not take Echinacea while on this medication  ??? Check with your doctor before getting any vaccinations (live or inactivated)    Storage, Handling Precautions, & Disposal     ??? Store at room temperature  ??? Keep away from children and pets  ??? This drug is considered hazardous and should be handled as little as possible.  Wash hands before and after touching pills. If someone else helps with medication administration, they should wear gloves.    The patient declined counseling on missed dose instructions, goals of therapy, side effects and monitoring parameters, warnings and precautions and storage, handling precautions, and disposal because they have taken the medication previously. The information in the declined sections below are for informational purposes only and was not discussed with patient.     Prograf (tacrolimus)    Medication & Administration     Dosage: Take 7 capsules (7mg  total) in the morning and 6 capsules (6mg  total) in the evening.     Administration:   ??? May take with or without food  ??? Take 12 hours apart    Adherence/Missed dose instructions:  ??? Take a missed dose as soon as you think about it.  ??? If it is close to the time for your next dose, skip the missed dose and go back to your normal time.  ??? Do not take 2 doses at the same time or extra doses.    Goals of Therapy     ??? To prevent organ rejection    Side Effects & Monitoring Parameters     ??? Common side effects  ??? Dizziness  ??? Fatigue  ??? Headache  ??? Stuffy nose or sore throat  ??? Nausea, vomiting, stomach pain, diarrhea, constipation  ??? Heartburn  ??? Back or joint pain  ??? Increased risk of infection    ??? The following side effects should be reported to the provider:  ??? Allergic reaction  ??? Kidney issues (change in quantity or urine passed, blood in urine, or weight gain)  ??? High blood pressure (dizziness, change in eyesight, headache)  ??? Electrolyte issues (change in mood, confusion, muscle pain, or weakness)  ??? Abnormal breathing  ??? Shakiness  ??? Unexplained bleeding or bruising (gums bleeding, blood in urine, nosebleeds, any abnormal bleeding)  ??? Signs of infection (fever, cough, wounds that will not heal)  ??? Skin changes (sores, paleness, new or changed bumps or moles)    ??? Monitoring Parameters  ???  Renal function  ??? Liver function  ??? Glucose levels  ??? Blood pressure  ??? Tacrolimus trough levels  ??? Cardiac monitoring (for QT prolongation)      Contraindications, Warnings, & Precautions     ??? Black Box Warning: Infections - immunosuppressant agents increase the risk of infection that may lead to hospitalization or death  ??? Black Box Warning: Malignancy - immunosuppressant agents may be associated with the development of malignancies that may lead to hospitalization or death  ??? Limit or avoid sun and ultraviolet light exposure, use appropriate sun protection  ??? Myocardial hypertrophy -avoid use in patients with congenital long QT syndrome  ??? Diabetes mellitus - the risk for new-onset diabetes and insulin-dependent post-transplant diabetes mellitus is increased with tacrolimus use after transplantation  ??? GI perforation  ??? Hyperkalemia  ??? Hypertension  ??? Nephrotoxicity  ??? Neurotoxicity  ??? This is a narrow therapeutic index drug. Do not switch manufacturers without first talking to the provider.    Drug/Food Interactions     ??? Medication list reviewed in Epic. The patient was instructed to inform the care team before taking any new medications or supplements. No drug interactions identified.   ??? Avoid alcohol  ??? Avoid grapefruit or grapefruit juice  ??? Avoid live vaccines    Storage, Handling Precautions, & Disposal     ??? Store at room temperature  ??? Keep away from children and pets      Current Medications (including OTC/herbals), Comorbidities and Allergies     Current Outpatient Medications   Medication Sig Dispense Refill   ??? acetaminophen (TYLENOL) 500 MG tablet Take 1-2 tablets (500-1,000 mg total) by mouth every six (6) hours as needed for pain or fever. 60 tablet 11   ??? acyclovir (ZOVIRAX) 400 MG tablet Take 1 tablet (400 mg total) by mouth Two (2) times a day. 60 tablet 2   ??? amLODIPine (NORVASC) 10 MG tablet Take 1 tablet (10 mg total) by mouth daily. 30 tablet 11   ??? aspirin (ECOTRIN) 81 MG tablet Take 1 tablet (81 mg total) by mouth daily. 30 tablet 11   ??? carvediloL (COREG) 25 MG tablet Take 1 tablet (25 mg total) by mouth Two (2) times a day. 60 tablet 11   ??? chlorthalidone (HYGROTON) 25 MG tablet Take 1 tablet (25 mg total) by mouth every morning. 30 tablet 11   ??? cyanocobalamin (VITAMIN B-12) 100 MCG tablet Take 1 tablet (100 mcg total) by mouth daily. 100 tablet 11   ??? diphenhydrAMINE (BENADRYL) 25 mg capsule Take 25 mg by mouth nightly as needed for itching.     ??? docusate sodium (COLACE) 100 MG capsule Take 1 capsule (100 mg total) by mouth two (2) times a day as needed for constipation. 60 capsule 11   ??? famotidine (PEPCID) 20 MG tablet Take 1 tablet (20 mg total) by mouth daily. 30 tablet 11   ??? insulin glargine (BASAGLAR, LANTUS) 100 unit/mL (3 mL) injection pen Inject 0.45 mL (45 Units total) under the skin nightly. 12 mL 11   ??? insulin lispro (HUMALOG) 100 unit/mL injection pen Inject 0-12 Units under the skin Three (3) times a day before meals. Inject 2 units for every 50 mg/dL over 161 mg/dL 15 mL 11   ??? levothyroxine (SYNTHROID) 75 MCG tablet Take 1 tablet (75 mcg total) by mouth daily. 30 tablet 11   ??? magnesium oxide-Mg AA chelate (MAGNESIUM, AMINO ACID CHELATE,) 133 mg Take 1 tablet by mouth Two (2) times  a day. 100 tablet 11   ??? melatonin 5 mg Chew Chew 1 tablet nightly as needed. 30 tablet 11   ??? mycophenolate (MYFORTIC) 180 MG EC tablet Take 3 tablets (540 mg total) by mouth Two (2) times a day. Adjust dose per medication card. 180 tablet 11   ??? nitroglycerin (NITROSTAT) 0.4 MG SL tablet Place 1 tablet (0.4 mg total) under the tongue continuous as needed for chest pain. Maximum of 3 doses in 15 minutes. 25 tablet 1   ??? polyethylene glycol (GLYCOLAX) 17 gram/dose powder Mix 1 capful (17 grams) in 4-8 ounces of water,juice or tea and drink daily as needed. 510 g 11   ??? rosuvastatin (CRESTOR) 10 MG tablet Take 1 tablet (10 mg total) by mouth daily. 30 tablet 11   ??? semaglutide (OZEMPIC) 0.25 mg or 0.5 mg(2 mg/1.5 mL) PnIj injection Inject 0.5 mg under the skin every seven (7) days. 1.5 mL 11   ??? sulfamethoxazole-trimethoprim (BACTRIM) 400-80 mg per tablet Take 1 tablet (80 mg of trimethoprim total) by mouth 3 (three) times a week. 12 tablet 5   ??? tacrolimus (PROGRAF) 1 MG capsule Take 7 capsules (7 mg total) by mouth daily AND 6 capsules (6 mg total) nightly. 390 capsule 11     No current facility-administered medications for this visit.       Allergies   Allergen Reactions   ??? Ferric Citrate Nausea And Vomiting       Patient Active Problem List   Diagnosis   ??? ESRD (end stage renal disease) on dialysis (CMS-HCC)   ??? Type 2 diabetes mellitus (CMS-HCC)   ??? Diabetic retinopathy associated with type 2 diabetes mellitus (CMS-HCC)   ??? Type 2 diabetes mellitus with diabetic neuropathy (CMS-HCC)   ??? Benign essential hypertension   ??? Congestive heart failure with LV diastolic dysfunction, NYHA class 1 (CMS-HCC)   ??? CAD (coronary artery disease), native coronary artery   ??? Obstructive sleep apnea syndrome   ??? Kidney transplant status, cadaveric   ??? Renal mass   ??? Nuclear sclerotic cataract of both eyes   ??? Anemia of chronic renal failure   ??? History of below-knee amputation of right lower extremity (CMS-HCC)   ??? Secondary hyperparathyroidism of renal origin (CMS-HCC)   ??? Hemodialysis-associated hypotension   ??? Chronic osteomyelitis of toe of left foot (CMS-HCC)   ??? Cholelithiasis ??? Charcot's joint, left ankle and foot   ??? Asthma, moderate persistent   ??? Retained orthopedic hardware       Reviewed and up to date in Epic.    Appropriateness of Therapy     Acute infections noted within Epic:  No active infections  Patient reported infection: None    Is medication and dose appropriate based on diagnosis and infection status? Yes    Prescription has been clinically reviewed: Yes      Baseline Quality of Life Assessment      How many days over the past month did your kidney transplant  keep you from your normal activities? For example, brushing your teeth or getting up in the morning. 0    Financial Information     Medication Assistance provided: None Required    Anticipated copay of $0 each tacrolimus and mycophenolate reviewed with patient. Verified delivery address.    Delivery Information     Scheduled delivery date: Patient currently has more than 2 weeks of medicine on hand and does not need anything today.    Expected start date: Patient currently  has medication at home and is taking.    Medication will be delivered via UPS to the prescription address in [facility].  This shipment will not require a signature.      Explained the services we provide at [facility] and that each month we would call to set up refills.  Stressed importance of returning phone calls so that we could ensure they receive their medications in time each month.  Informed patient that we should be setting up refills 7-10 days prior to when they will run out of medication.  A pharmacist will reach out to perform a clinical assessment periodically.  Informed patient that a welcome packet, containing information about our pharmacy and other support services, a Notice of Privacy Practices, and a drug information handout will be sent.      The patient or caregiver noted above participated in the development of this care plan and knows that they can request review of or adjustments to the care plan at any time.      Patient or caregiver verbalized understanding of the above information as well as how to contact the pharmacy at [phone] option 4 with any questions/concerns.  The pharmacy is open Monday through Friday 8:30am-4:30pm.  A pharmacist is available 24/7 via pager to answer any clinical questions they may have.    Patient Specific Needs     - Does the patient have any physical, cognitive, or cultural barriers? No    - Does the patient have adequate living arrangements? (i.e. the ability to store and take their medication appropriately) Yes    - Did you identify any home environmental safety or security hazards? No    - Patient prefers to have medications discussed with  Patient     - Is the patient or caregiver able to read and understand education materials at a high school level or above? Yes    - Patient's primary language is  English     - Is the patient high risk? Yes, patient is taking a REMS drug. Medication is dispensed in compliance with REMS program    - Does the patient require physician intervention or other additional services (i.e. dietary/nutrition, smoking cessation, social work)? No      Tera Helper  [facility] Specialty Pharmacist

## 2020-12-21 ENCOUNTER — Ambulatory Visit: Admit: 2020-12-21 | Discharge: 2020-12-22 | Payer: MEDICARE | Attending: Surgery | Primary: Surgery

## 2020-12-21 ENCOUNTER — Institutional Professional Consult (permissible substitution): Admit: 2020-12-21 | Discharge: 2020-12-22 | Payer: MEDICARE

## 2020-12-21 DIAGNOSIS — Z940 Kidney transplant status: Secondary | ICD-10-CM | POA: Diagnosis not present

## 2020-12-21 DIAGNOSIS — Z79899 Other long term (current) drug therapy: Secondary | ICD-10-CM | POA: Diagnosis not present

## 2020-12-21 MED ORDER — FAMOTIDINE 20 MG TABLET
ORAL_TABLET | Freq: Every day | ORAL | 11 refills | 30.00000 days | Status: CP | PRN
Start: 2020-12-21 — End: 2020-12-21

## 2020-12-21 MED ORDER — NITROGLYCERIN 0.4 MG SUBLINGUAL TABLET
ORAL_TABLET | SUBLINGUAL | 1 refills | 8 days | Status: CP | PRN
Start: 2020-12-21 — End: ?

## 2020-12-21 MED ORDER — INSULIN GLARGINE (U-100) 100 UNIT/ML (3 ML) SUBCUTANEOUS PEN
Freq: Every evening | SUBCUTANEOUS | 9 refills | 33 days | Status: CP
Start: 2020-12-21 — End: ?

## 2020-12-21 MED ORDER — AMLODIPINE 10 MG TABLET
ORAL_TABLET | Freq: Every day | ORAL | 11 refills | 30.00000 days | Status: CP
Start: 2020-12-21 — End: ?

## 2020-12-21 MED ORDER — ACYCLOVIR 400 MG TABLET
ORAL_TABLET | Freq: Two times a day (BID) | ORAL | 2 refills | 30 days | Status: CP
Start: 2020-12-21 — End: ?
  Filled 2021-01-03: qty 60, 30d supply, fill #0

## 2020-12-21 MED ORDER — MYCOPHENOLATE SODIUM 180 MG TABLET,DELAYED RELEASE
ORAL_TABLET | Freq: Two times a day (BID) | ORAL | 11 refills | 30 days | Status: CP
Start: 2020-12-21 — End: 2021-12-21
  Filled 2021-01-03: qty 180, 30d supply, fill #0

## 2020-12-21 MED ORDER — POLYETHYLENE GLYCOL 3350 17 GRAM/DOSE ORAL POWDER
Freq: Every day | ORAL | 11 refills | 30 days | Status: CP | PRN
Start: 2020-12-21 — End: 2021-01-20

## 2020-12-21 MED ORDER — DIPHENHYDRAMINE 25 MG CAPSULE
ORAL_CAPSULE | Freq: Every evening | ORAL | 0 refills | 48 days | Status: CP | PRN
Start: 2020-12-21 — End: ?

## 2020-12-21 MED ORDER — MG-PLUS-PROTEIN 133 MG TABLET
ORAL_TABLET | Freq: Two times a day (BID) | ORAL | 11 refills | 50.00000 days | Status: CP
Start: 2020-12-21 — End: ?
  Filled 2021-03-06: qty 100, 50d supply, fill #0

## 2020-12-21 MED ORDER — CARVEDILOL 25 MG TABLET
ORAL_TABLET | Freq: Two times a day (BID) | ORAL | 11 refills | 30 days | Status: CP
Start: 2020-12-21 — End: ?
  Filled 2021-01-03: qty 60, 30d supply, fill #0

## 2020-12-21 MED ORDER — TACROLIMUS 1 MG CAPSULE, IMMEDIATE-RELEASE
ORAL_CAPSULE | ORAL | 11 refills | 30 days | Status: CP
Start: 2020-12-21 — End: 2021-12-21

## 2020-12-21 MED ORDER — DOCUSATE SODIUM 100 MG CAPSULE
ORAL_CAPSULE | Freq: Two times a day (BID) | ORAL | 11 refills | 30 days | Status: CP | PRN
Start: 2020-12-21 — End: 2021-01-20

## 2020-12-21 MED ORDER — ACETAMINOPHEN 500 MG TABLET
ORAL_TABLET | Freq: Four times a day (QID) | ORAL | 11 refills | 8 days | Status: CP | PRN
Start: 2020-12-21 — End: ?

## 2020-12-21 MED ORDER — ASPIRIN 81 MG TABLET,DELAYED RELEASE
ORAL_TABLET | Freq: Every day | ORAL | 11 refills | 30 days | Status: CP
Start: 2020-12-21 — End: 2021-12-21
  Filled 2021-01-03: qty 30, 30d supply, fill #0

## 2020-12-21 MED ORDER — INSULIN LISPRO (U-100) 100 UNIT/ML SUBCUTANEOUS PEN
11 refills | 0.00000 days | Status: CP
Start: 2020-12-21 — End: 2020-12-21

## 2020-12-21 MED ORDER — CHLORTHALIDONE 25 MG TABLET
ORAL_TABLET | Freq: Every morning | ORAL | 11 refills | 30.00000 days | Status: CP
Start: 2020-12-21 — End: 2021-12-21

## 2020-12-21 MED ORDER — OZEMPIC 0.25 MG OR 0.5 MG (2 MG/1.5 ML) SUBCUTANEOUS PEN INJECTOR
SUBCUTANEOUS | 11 refills | 28 days | Status: CP
Start: 2020-12-21 — End: 2021-12-21

## 2020-12-21 MED ORDER — ROSUVASTATIN 10 MG TABLET
ORAL_TABLET | Freq: Every day | ORAL | 11 refills | 30 days | Status: CP
Start: 2020-12-21 — End: ?
  Filled 2021-01-03: qty 30, 30d supply, fill #0

## 2020-12-21 MED ORDER — MELATONIN 5 MG TABLET
ORAL_TABLET | Freq: Every evening | ORAL | 5 refills | 0 days | Status: CP | PRN
Start: 2020-12-21 — End: ?

## 2020-12-21 MED ORDER — LEVOTHYROXINE 75 MCG TABLET
ORAL_TABLET | Freq: Every day | ORAL | 11 refills | 30 days | Status: CP
Start: 2020-12-21 — End: 2021-12-21

## 2020-12-21 MED ORDER — CYANOCOBALAMIN (VIT B-12) 100 MCG TABLET
ORAL_TABLET | Freq: Every day | ORAL | 11 refills | 100 days | Status: CP
Start: 2020-12-21 — End: ?

## 2020-12-21 NOTE — Unmapped (Signed)
BP rechecked per Pharmacist and patient request (values documented in chart).

## 2020-12-21 NOTE — Unmapped (Signed)
Transplant Coordinator, Clinic Visit   Pt seen today by transplant nephrology for follow up, reviewed medications and symptoms.          12/21/20 0843   BP: 136/71   Pulse: 82   Temp: 36.6 ??C (97.9 ??F)   Weight: (!) 106.7 kg (235 lb 4.8 oz)   Height: 170.2 cm (5' 7)   PainSc: 0-No pain     Patient presents to clinic today with his wife.  He states he feels well overall.   His home blood pressures are 130's/70's.  He reports that after taking Chlorthalidone BP drops to 101 systolic and patient becomes symptomatic feeling dizzy and lethargic.  Per Dr Christena Deem, will decrease Chlorthalidone to 12.5 mg daily. Patient endorses improvement in his swelling and is making an average of 1500 ml urine per day.  He endorses a few loose stools, but denies diarrhea.  He will follow up with Dr Elvera Maria at [place] next week and coordinator will reach out to Urology scheduling to follow up with stent removal.  Per patient, he states Urology cancelled his current appointment and told him they would reschedule.  Assessment  BP: 130's/60's at home  Lightheaded: after Chlorthalidone   BG: 160-191  Headache: denies  Hand tremors: denies  Numbness/tingling: denies  Fevers: denies  Chills/sweats: denies  Shortness of breath: denies  Chest pain or pressure: denies  Palpitations: none  Abdominal pain: none  Heart burn: none  Nausea/vomiting: none  Diarrhea/constipation: loose stool x 2  UTI symptoms: denies  Swelling: mild swelling, improved recently, patient elevating legs daily  Sleep: sleeping well  Pain: denies  Incision: clean, dry, intact, approximated, staples remain in place    Good appetite; reports adequate hydration.     Intake: reports adequate, 850 ml plus  Output: 1500    Any new medications? none  Immunosuppressant last taken: 0800 9/22    Immunization status: up to date    Functional Score: 90     Able to carry on normal activity;  Minor signs or symptoms of disease.      I spent a total of 25 minutes with Nicholas Strong reviewing medications and symptoms.

## 2020-12-21 NOTE — Unmapped (Signed)
TRANSPLANT SURGERY PROGRESS NOTE    Assessment and Plan  Nicholas Strong??is a 55 y.o.??male??with??history of HTN, ESRD secondary to diabetes mellitus??Polyneuropathy,??Renal Stone,??and Renal Cell Ca (S/p Left nephrectomy) who is s/p R kidney allotransplant on 12/04/2020 and is being seen for follow up after starting Chlorthalidone therapy.     - Discontinue Chlorthalidone  - Creatinine not completely normalized but remains stable in the 2's, consider biopsy  - Staples can be removed at nephrology visit next week   - Prograf goal 8-10   - Would wait until 3 months after kidney transplant to undergo elective surgery for his BKA stump revision    Subjective  Nicholas Strong is a 55 y.o. male with a history of HTN, ESRD secondary to diabetes mellitus??Polyneuropathy,??Renal Stone,??and Renal Cell Ca (S/p Left nephrectomy) who is s/p R kidney allotransplant on 12/04/2020.  At last clinic visit, the patient was started on chlorthalidone for blood pressure management.  Since then his blood pressure has been in the 130 systolic, previously was in the 140s to 150s range prior to chlorthalidone therapy.  He has had 2-3 episodes where his systolic blood pressure dipped to 101-103 with associated fatigue.  The patient denies lightheadedness, dizziness, and syncope during these episodes.  Continues to produce 3 L of urine per day, however he is not drinking enough fluids to keep up with his copious output.      Objective    Vitals:    12/21/20 0843   BP: 136/71   BP Site: R Arm   BP Position: Sitting   BP Cuff Size: Large   Pulse: 82   Temp: 36.6 ??C (97.9 ??F)   TempSrc: Tympanic   Weight: (!) 106.7 kg (235 lb 4.8 oz)   Height: 170.2 cm (5' 7)      Body mass index is 36.85 kg/m??.     Physical Exam:    General Appearance:   No acute distress  Lungs:                Clear to auscultation bilaterally  Heart:                           Regular rate and rhythm  Abdomen:                Soft, non-tender, non-distended, staples intact without wound breakdown. JP site healing well without breakdown.  Extremities:              Warm and well perfused        Data Review:  All lab results last 24 hours:  No results found for this or any previous visit (from the past 24 hour(s)).      Imaging:

## 2020-12-21 NOTE — Unmapped (Signed)
[facility] CLINIC PHARMACY NOTE  12/21/2020   Nicholas Strong  [id number]    Medication changes today:   1. Decrease tacrolimus to 7 mg AM, 6 mg PM (pt was told to make this decrease but forgot to decrease until last night)  2. Decrease chlorthalidone to 12.5 mg (previously 25 mg)  3. Start 3U Humalog with breakfast and lunch     Education/Adherence tools provided today:  - Provided updated medication list  - Provided additional education on immunosuppression and transplant related medications including reviewing indications of medications, dosing and side effects  - Provided additional education on diabetes management  - Provided additional pill box education    Follow up items:  1. Goal of understanding indications and dosing of immunosuppression medications  2. BG  3. BP  4. Mg/K+  5. HCV RNA  6. Swelling    Next visit with pharmacy in 1-2 weeks  ____________________________________________________________________    Nicholas Strong is a 55 y.o. male s/p deceased kidney transplant on 15-Dec-2020 (Kidney) 2/2 T2DM polyneuropathy, renal stone, and RCC (s/p left nephrectomy).     Immunologic Risk: cPRA 0,, HLA MM 5/6, and first transplant    Induction Agent : thymoglobulin    Donor Factors: KDPI 70%, DBD and HCV NAT+    Other PMH significant for Hypertension, CAD and Hypothyroidism  Post op course complicated by [place] on POD0 showed elevated velocity in the main renal artery at the level of the anastomosis, likely secondary to post-op edema    Rejection History: NTD  Infection History: NTD  ___________________________________________________________________    First visit with transplant pharmacy    Interval History: NTD    Seen by pharmacy today for: medication management, blood glucose management and education and pill box fill and adherence education    CC:  Patient complains of a drop in blood pressure accompanied by a rapid onset feeling of fatigue     General: Fatigue (with blood pressure drop)  Neuro: No issues  CV: No issues  Resp: No issues  GI: No issues  GU: No issues  Derm: No issues  Psych: No issues.    Fluid Status:   Meds: chlorthalidone 25 mg daily  Edema no (resolved with chlorthalidone and elevating legs at night), SOB no  Intake: seems ~40 oz daily (struggling to increase intake after previous restriction with dialysis)  Output: not recording, urinating regularly   Plan: Decrease chlorthalidone to 12.5 mg daily. Fluid goal of at least 80 oz and record I/Os. Continue to monitor.      Vitals:    12/21/20 0847   BP: 136/71   Pulse: 82   Temp: 36.6 ??C (97.9 ??F)     ___________________________________________________________________    Allergies   Allergen Reactions   ??? Ferric Citrate Nausea And Vomiting       Medications reviewed in EPIC medication station and updated today by the clinical pharmacist practitioner.    Current Outpatient Medications   Medication Instructions   ??? acetaminophen (TYLENOL) 500-1,000 mg, Oral, Every 6 hours PRN   ??? acyclovir (ZOVIRAX) 400 mg, Oral, 2 times a day (standard)   ??? amLODIPine (NORVASC) 10 mg, Oral, Daily (standard)   ??? aspirin (ECOTRIN) 81 mg, Oral, Daily (standard)   ??? carvediloL (COREG) 25 mg, Oral, 2 times a day (standard)   ??? chlorthalidone (HYGROTON) 12.5 mg, Oral, Every morning   ??? cyanocobalamin (VITAMIN B-12) 100 mcg, Oral, Daily (standard)   ??? diphenhydrAMINE (BENADRYL) 25 mg, Oral, Nightly PRN   ???  docusate sodium (COLACE) 100 mg, Oral, 2 times a day PRN   ??? famotidine (PEPCID) 20 mg, Oral, Daily PRN   ??? insulin glargine (BASAGLAR, LANTUS) 45 Units, Subcutaneous, Nightly   ??? insulin lispro (HUMALOG) 100 unit/mL injection pen Inject 3 units with breakfast and lunch. Plus sliding scale insulin TID with meals (2 units for every 50 > 150). Max daily dose: 42 units   ??? levothyroxine (SYNTHROID) 75 mcg, Oral, Daily (standard)   ??? magnesium oxide-Mg AA chelate (MAGNESIUM, AMINO ACID CHELATE,) 133 mg 1 tablet, Oral, 2 times a day (standard)   ??? melatonin 5 mg tablet Oral, Nightly PRN   ??? mycophenolate (MYFORTIC) 540 mg, Oral, 2 times a day (standard), Adjust dose per medication card.   ??? nitroglycerin (NITROSTAT) 0.4 mg, Sublingual, Continuous PRN, Maximum of 3 doses in 15 minutes.    ??? OZEMPIC 0.5 mg, Subcutaneous, Every 7 days   ??? polyethylene glycol (GLYCOLAX) 17 gram/dose powder Mix 1 capful (17 grams) in 4-8 ounces of water,juice or tea and drink daily as needed.   ??? rosuvastatin (CRESTOR) 10 mg, Oral, Daily (standard)   ??? [START ON 12/22/2020] sulfamethoxazole-trimethoprim (BACTRIM) 400-80 mg per tablet 80 mg of trimethoprim, Oral, 3 times weekly   ??? tacrolimus (PROGRAF) 1 MG capsule Take 7 capsules (7 mg total) by mouth daily AND 6 capsules (6 mg total) nightly.         GRAFT FUNCTION: improving  Lab Results   Component Value Date    Creatinine 2.88 (H) 12/20/2020    Creatinine 2.75 (H) 12/14/2020    Creatinine 3.60 (H) 12/11/2020    Creatinine 5.71 (H) 12/09/2020       Baseline Scr: TBD  Scr nadir: NTD  Estimated Creatinine Clearance: 33.7 mL/min (A) (based on SCr of 2.88 mg/dL (H)).    Proteinuria/UPC: Yes: 0.53 (12/11/20).  Lab Results   Component Value Date    Protein/Creatinine Ratio, Urine 0.53 (H) 12/11/2020    Protein/Creatinine Ratio, Urine 0.744 12/08/2020       DSA: ntd  No results found for: DSAPT, DSACM, DSA1C    Zero hour biopsy:       Biopsies to date: NTD      CURRENT IMMUNOSUPPRESSION:    Tacrolimus (Prograf) 7 mg every morning + 6 mg every evening    Tacrolimus Goal: 8 - 10   Mycophenolate sodium (Myfortic) 540 mg BID      IMMUNOSUPPRESSION DRUG LEVELS:  Lab Results   Component Value Date    Tacrolimus, Trough 8.0 12/09/2020    Tacrolimus, Trough 6.0 12/08/2020    Tacrolimus, Trough 6.8 12/07/2020    Tacrolimus, Timed 12.7 12/14/2020     Prograf level is accurate 12 hour trough    Patient is tolerating immunosuppression well    WBC/ANC:  wnl  Lab Results   Component Value Date    WBC 9.4 12/20/2020       Plan: Will maintain current immunosuppression. Patient was told to decrease dose to current regimen of 7 mg AM, 6 mg PM on 9/16 but the patient informed us today that he forgot to change his pill box so he has been taking 7 mg BID prior to taking 6 mg last night. Continue to monitor.      OI Prophylaxis:   CMV Status: D- /R-, low risk.   CMV prophylaxis: acyclovir 400 mg bid x 3 months per protocol.  No results found for: CMVCP    PCP Prophylaxis: bactrim SS 1 tab MWF x 6  months.    Thrush: completed in hospital    Patient is  tolerating infectious prophylaxis well    Plan: Continue per protocol. Continue to monitor.    HCV Donor+ Organ  HCV RNA status: Not detected (9/6)  Plan: Will recheck HCV RNA with tomorrow's labs.     CV Prophylaxis: asa 81 mg   The ASCVD Risk Score Denman George DC Montez Hageman, et al., 2013) failed to return a valid value for an unknown reason.  No results found for: LIPID    Statin therapy: Indicated; currently on rosuvastatin 10 mg (switched from prior atorvastatin 40 mg d/t potential use of [place] outpatient for HCV based on donor HCV NAT+ status)  Plan: no changes. Continue to monitor       BP: Goal < 140/90. Clinic vitals reported above  Home BP ranges:   Date AM BP PM BP   9/16 141/73 146/73   9/17 132/69 128/64   9/18 131/60 130/68   9/19 135/70 141/71   9/20 131/68 122/59   9/21 131/59 135/66     Current meds include: carvedilol 25 mg BID, amlodipine 10 mg daily, chlorthalidone 25 mg (started 12/14/20)  Plan: within goal. Decrease chlorthalidone to 12.5 mg as patient is experiencing episodes of fatigue and checks his BP and reports systolic's ~100. This is new since starting chlorthalidone last week. Continue to monitor    Anemia of CKD:  H/H:   Lab Results   Component Value Date    HGB 9.4 (L) 12/20/2020     Lab Results   Component Value Date    HCT 31.2 (L) 12/20/2020     Iron panel:  No results found for: IRON, TIBC, FERRITIN  No results found for: LABIRON    Prior ESA use: none      Plan: out of goal. No change.  Continue to monitor.     DM: Lab Results   Component Value Date    A1C 5.3 12/03/2020   . Goal A1c < 7  History of Dm? Yes: T2DM since 1997  Established with endocrinologist/PCP for BG managment? No  Currently on: Lantus 45U daily (taking in the morning), Humalog SSI, Ozempic 0.5 mg weekly (restarted 9/16)  Home BS log:   Breakfast Lunch  Dinner  HS   Date [facility]    9/16 264  176  206  216   9/17 177  200  213  196   9/18 272    167     9/19 255    107     9/20 254  309  129  149   9/21 233  216    198     Diet: not much of an appetite, pt eats 2 smaller meals per day  Exercise: not assessed  Hypoglycemia: no  Plan: Start Humalog 3U with breakfast and lunch in addition to SSI. Continue to monitor.      Electrolytes: CO2 is low, K slightly elevated  Lab Results   Component Value Date    Potassium 5.2 (H) 12/20/2020    Sodium 139 12/20/2020    Magnesium 1.7 12/20/2020    CO2 21 (L) 12/20/2020    Phosphorus 4.3 12/20/2020       Meds currently on: pt never started Mg supplementation, will hold off for now  Plan: No change.  Continue to monitor     GI/BM: pt reports soft bowel movements, no diarrhea  Meds currently on: docusate PRN (taking as needed),  Miralax PRN (not using), famotidine 20 mg daily  Plan: Decrease famotidine to PRN rather than taking daily. Continue to monitor    Pain: pt reports no pain  Meds currently on: APAP PRN (not using)  Plan: No change. Continue to monitor    Bone health:   Vitamin D Level: none available. Goal > 30.   No results found for: VITDTOT, VITDTOTAL, VITDTOTAL, VITDTOTAL, VITDTOTAL    Lab Results   Component Value Date    Calcium 9.2 12/20/2020    Calcium 9.1 12/14/2020       Last DEXA results:  none available  Current meds include: none   Plan: Vitamin D level  needs to be drawn with next lab schedule,assess supplementation needs. Continue to monitor.     Women's/Men's Health:  Nicholas Strong is a 55 y.o. male. Patient reports no men's/women's health issues  Plan: Continue to monitor    Hypothyroidism:  Current meds: levothyroxine 75 mcg  Plan: no changes. Continue to monitor and follow-up with TSH.    Sleep:  Patient is sleeping okay since discharge (waking up at night to pee often), not taking anything for sleep  Current meds: benadryl (not taking), melatonin 5 mg PRN (not taking)  Plan: no changes. Continue to monitor    Immunizations:  Influenza [Annual]: Received 2021    PCV13: Never received  PPSV23: Received 05/2015  PCV20: Never received    Shingrix Zoster [2 doses, 2 - 6 months apart]: Never received    COVID-19 [3 primary doses, 2 boosters]: 1st dose given 06/11/19, 2nd dose given 07/07/19, 3rd dose given 12/31/19 and Booster given 08/31/20    Immunization status: needs Influenza vaccine and bivalent COVID booster      Pharmacy preference:  Will plan to use [facility] for specialty med[facility]; [facility] otherwise   Medication Refills:  n/a  Medication Access:  n/a    Adherence: Patient has average understanding of medications; was not able to independently identify names/doses of immunosuppressants and OI meds.  Patient  does fill their own pill box on a regular basis at home.  Patient brought medication card:yes  Pill box:did not bring  Plan: become more familiar with medications; provided moderate adherence counseling/intervention    Patient was reviewed with Dr. Norma Fredrickson who was agreement with the stated plan:     During this visit, the following was completed:   BG log data assessment  BP log data assessment  Labs ordered and evaluated  complex treatment plan >1 DS   I spent a total of 40 minutes face to face with the patient delivering clinical care and providing education/counseling.    All questions/concerns were addressed to the patient's satisfaction.  __________________________________________  Erven Colla, PharmD [phone]    Olivia Mackie, PharmD, BCTXP, BCPS, CPP  Solid Organ Transplant Clinical Pharmacist Practitioner

## 2020-12-21 NOTE — Unmapped (Signed)
Please see patient pharmacy visit for documentation.

## 2020-12-21 NOTE — Unmapped (Addendum)
[facility] Pharmacist has reviewed this new prescription.  Patient was counseled on this dosage change by [facility]- see epic note from 12/14/20.  Next refill call date adjusted if necessary.        Clinical Assessment Needed For: Dose Change  Medication: Tacrolimus 1mg  capsule  Last Fill Date/Day Supply: 12/09/2020 / 30 days  Refill Too Soon until 12/31/2020  Was previous dose already scheduled to fill: No    Notes to Pharmacist: Will re-test on 10/03

## 2020-12-22 ENCOUNTER — Other Ambulatory Visit (HOSPITAL_COMMUNITY)
Admission: RE | Admit: 2020-12-22 | Discharge: 2020-12-22 | Disposition: A | Discharge status: Home / Self Care | Payer: Medicare Other | Source: Ambulatory Visit | Attending: Pediatric Nephrology | Admitting: Pediatric Nephrology

## 2020-12-22 DIAGNOSIS — Z940 Kidney transplant status: Secondary | ICD-10-CM | POA: Insufficient documentation

## 2020-12-22 DIAGNOSIS — Z09 Encounter for follow-up examination after completed treatment for conditions other than malignant neoplasm: Secondary | ICD-10-CM | POA: Insufficient documentation

## 2020-12-22 DIAGNOSIS — D899 Disorder involving the immune mechanism, unspecified: Secondary | ICD-10-CM | POA: Insufficient documentation

## 2020-12-22 DIAGNOSIS — B259 Cytomegaloviral disease, unspecified: Secondary | ICD-10-CM | POA: Insufficient documentation

## 2020-12-22 DIAGNOSIS — Z79899 Other long term (current) drug therapy: Secondary | ICD-10-CM | POA: Insufficient documentation

## 2020-12-22 DIAGNOSIS — E1129 Type 2 diabetes mellitus with other diabetic kidney complication: Secondary | ICD-10-CM | POA: Insufficient documentation

## 2020-12-22 DIAGNOSIS — D631 Anemia in chronic kidney disease: Secondary | ICD-10-CM | POA: Insufficient documentation

## 2020-12-22 DIAGNOSIS — Z789 Other specified health status: Secondary | ICD-10-CM | POA: Insufficient documentation

## 2020-12-22 DIAGNOSIS — Z9483 Pancreas transplant status: Secondary | ICD-10-CM | POA: Insufficient documentation

## 2020-12-22 DIAGNOSIS — E559 Vitamin D deficiency, unspecified: Secondary | ICD-10-CM | POA: Insufficient documentation

## 2020-12-22 DIAGNOSIS — N390 Urinary tract infection, site not specified: Secondary | ICD-10-CM | POA: Insufficient documentation

## 2020-12-22 DIAGNOSIS — Z114 Encounter for screening for human immunodeficiency virus [HIV]: Secondary | ICD-10-CM | POA: Insufficient documentation

## 2020-12-22 LAB — CBC W/ DIFFERENTIAL
BASOPHILS ABSOLUTE COUNT: 0.1 10*3/uL (ref 0.0–0.1)
BASOPHILS RELATIVE PERCENT: 1 %
EOSINOPHILS ABSOLUTE COUNT: 0.2 10*3/uL (ref 0.0–0.5)
EOSINOPHILS RELATIVE PERCENT: 2 %
HEMATOCRIT: 32 — ABNORMAL LOW (ref 39.0–52.01)
HEMOGLOBIN: 9.5 — ABNORMAL LOW
LYMPHOCYTES ABSOLUTE COUNT: 0.3 10*3/uL — ABNORMAL LOW
LYMPHOCYTES RELATIVE PERCENT: 5 %
MEAN CORPUSCULAR HEMOGLOBIN CONC: 29.7 g/dL — ABNORMAL LOW (ref 30.0–36.0)
MEAN CORPUSCULAR HEMOGLOBIN: 27.9 pg (ref 20.0–34.0)
MEAN CORPUSCULAR VOLUME: 94.1 fL (ref 80.0–100.0)
MONOCYTES ABSOLUTE COUNT: 0.6 10*3/uL (ref 0.1–1.0)
MONOCYTES RELATIVE PERCENT: 9 %
NEUTROPHILS RELATIVE PERCENT: 82 %
NUCLEATED RED BLOOD CELLS: 0 (ref 0.0–0.23)
PLATELET COUNT: 184 10*3/uL (ref 150–400)
RED BLOOD CELL COUNT: 3.4 — ABNORMAL LOW
RED CELL DISTRIBUTION WIDTH: 16 % — ABNORMAL HIGH (ref 11.5–15.5)
WHITE BLOOD CELL COUNT: 7.4

## 2020-12-22 LAB — RENAL FUNCTION PANEL
ANION GAP: 8 (ref 5–15)
BLOOD UREA NITROGEN: 47 mg/dL — ABNORMAL HIGH (ref 6–20)
CALCIUM: 9.7 mg/dL (ref 8.9–10.3)
CHLORIDE: 115 mmol/L — ABNORMAL HIGH
CO2: 22 Mmol/L (ref 22–321)
CREATININE: 3.09 mg/dL — ABNORMAL HIGH (ref 0.61–1.24)
EGFR MDRD AF AMER: 23 mL/min — ABNORMAL LOW
GLUCOSE RANDOM: 186 mg/dL — ABNORMAL HIGH (ref 70–99)
POTASSIUM: 5.6 mmol/L — ABNORMAL HIGH (ref 3.5–5.1)
SODIUM: 145 mmol/L (ref 135–1451)

## 2020-12-22 LAB — MAGNESIUM
MAGNESIUM: 2 mg/dL (ref 1.7–2.41)
Magnesium: 2 mg/dL (ref 1.7–2.4)

## 2020-12-22 LAB — PHOSPHORUS
PHOSPHORUS: 4 mg/dL (ref 2.5–4.61)
Phosphorus: 4 mg/dL (ref 2.5–4.6)

## 2020-12-22 LAB — TACROLIMUS LEVEL
TACROLIMUS BLOOD: 15.6 ng/mL (ref 2.0–20.0)
Tacrolimus (FK506) - LabCorp: 15.6 ng/mL (ref 2.0–20.0)

## 2020-12-22 LAB — CBC WITH DIFFERENTIAL/PLATELET
Abs Immature Granulocytes: 0.04 10*3/uL (ref 0.00–0.07)
Basophils Absolute: 0.1 10*3/uL (ref 0.0–0.1)
Basophils Relative: 1 %
Eosinophils Absolute: 0.2 10*3/uL (ref 0.0–0.5)
Eosinophils Relative: 2 %
HCT: 32 % — ABNORMAL LOW (ref 39.0–52.0)
Hemoglobin: 9.5 g/dL — ABNORMAL LOW (ref 13.0–17.0)
Immature Granulocytes: 1 %
Lymphocytes Relative: 5 %
Lymphs Abs: 0.3 10*3/uL — ABNORMAL LOW (ref 0.7–4.0)
MCH: 27.9 pg (ref 26.0–34.0)
MCHC: 29.7 g/dL — ABNORMAL LOW (ref 30.0–36.0)
MCV: 94.1 fL (ref 80.0–100.0)
Monocytes Absolute: 0.6 10*3/uL (ref 0.1–1.0)
Monocytes Relative: 9 %
Neutro Abs: 6.2 10*3/uL (ref 1.7–7.7)
Neutrophils Relative %: 82 %
Platelets: 184 10*3/uL (ref 150–400)
RBC: 3.4 MIL/uL — ABNORMAL LOW (ref 4.22–5.81)
RDW: 16 % — ABNORMAL HIGH (ref 11.5–15.5)
WBC: 7.4 10*3/uL (ref 4.0–10.5)
nRBC: 0 % (ref 0.0–0.2)

## 2020-12-22 LAB — BASIC METABOLIC PANEL
Anion gap: 8 (ref 5–15)
BUN: 47 mg/dL — ABNORMAL HIGH (ref 6–20)
CO2: 22 mmol/L (ref 22–32)
Calcium: 9.7 mg/dL (ref 8.9–10.3)
Chloride: 115 mmol/L — ABNORMAL HIGH (ref 98–111)
Creatinine, Ser: 3.09 mg/dL — ABNORMAL HIGH (ref 0.61–1.24)
GFR, Estimated: 23 mL/min — ABNORMAL LOW (ref >60)
Glucose, Bld: 186 mg/dL — ABNORMAL HIGH (ref 70–99)
Potassium: 5.6 mmol/L — ABNORMAL HIGH (ref 3.5–5.1)
Sodium: 145 mmol/L (ref 135–145)

## 2020-12-22 MED ORDER — SULFAMETHOXAZOLE 400 MG-TRIMETHOPRIM 80 MG TABLET
ORAL_TABLET | ORAL | 5 refills | 28.00000 days | Status: CP
Start: 2020-12-22 — End: ?
  Filled 2021-01-03: qty 12, 28d supply, fill #0

## 2020-12-25 ENCOUNTER — Other Ambulatory Visit (HOSPITAL_COMMUNITY)
Admission: RE | Admit: 2020-12-25 | Discharge: 2020-12-25 | Disposition: A | Discharge status: Home / Self Care | Payer: Medicare Other | Source: Ambulatory Visit | Attending: Pediatric Nephrology | Admitting: Pediatric Nephrology

## 2020-12-25 DIAGNOSIS — Z940 Kidney transplant status: Principal | ICD-10-CM

## 2020-12-25 DIAGNOSIS — Z789 Other specified health status: Secondary | ICD-10-CM | POA: Diagnosis not present

## 2020-12-25 DIAGNOSIS — D899 Disorder involving the immune mechanism, unspecified: Secondary | ICD-10-CM | POA: Insufficient documentation

## 2020-12-25 DIAGNOSIS — B259 Cytomegaloviral disease, unspecified: Secondary | ICD-10-CM | POA: Insufficient documentation

## 2020-12-25 DIAGNOSIS — Z09 Encounter for follow-up examination after completed treatment for conditions other than malignant neoplasm: Secondary | ICD-10-CM | POA: Diagnosis not present

## 2020-12-25 DIAGNOSIS — Z79899 Other long term (current) drug therapy: Secondary | ICD-10-CM | POA: Diagnosis not present

## 2020-12-25 DIAGNOSIS — E1129 Type 2 diabetes mellitus with other diabetic kidney complication: Secondary | ICD-10-CM | POA: Diagnosis not present

## 2020-12-25 DIAGNOSIS — Z9483 Pancreas transplant status: Secondary | ICD-10-CM | POA: Diagnosis not present

## 2020-12-25 DIAGNOSIS — Z114 Encounter for screening for human immunodeficiency virus [HIV]: Secondary | ICD-10-CM | POA: Diagnosis not present

## 2020-12-25 DIAGNOSIS — N390 Urinary tract infection, site not specified: Secondary | ICD-10-CM | POA: Diagnosis not present

## 2020-12-25 DIAGNOSIS — E559 Vitamin D deficiency, unspecified: Secondary | ICD-10-CM | POA: Diagnosis not present

## 2020-12-25 DIAGNOSIS — D631 Anemia in chronic kidney disease: Secondary | ICD-10-CM | POA: Insufficient documentation

## 2020-12-25 LAB — CBC W/ DIFFERENTIAL
BASOPHILS ABSOLUTE COUNT: 0.1 10*3/uL (ref 0.0–0.1)
BASOPHILS RELATIVE PERCENT: 1 %
EOSINOPHILS ABSOLUTE COUNT: 0.2 10*3/uL (ref 0.0–0.5)
EOSINOPHILS RELATIVE PERCENT: 3 %
HEMATOCRIT: 33.4 — ABNORMAL LOW (ref 39.0–52.0)
HEMOGLOBIN: 9.8 g/dL — ABNORMAL LOW (ref 13.0–17.01)
IMMATURE CELLS: 1
LYMPHOCYTES ABSOLUTE COUNT: 0.4 10*3/uL — ABNORMAL LOW (ref 0.7–4.0)
LYMPHOCYTES RELATIVE PERCENT: 7
MEAN CORPUSCULAR HEMOGLOBIN CONC: 29.3 g/dL — ABNORMAL LOW (ref 30.0–36.0)
MEAN CORPUSCULAR HEMOGLOBIN: 27.5 pg (ref 26.0–34.0)
MEAN CORPUSCULAR VOLUME: 93.8 (ref 60.0–100.01)
MONOCYTES ABSOLUTE COUNT: 0.6 10*3/uL (ref 0.1–1.0)
MONOCYTES RELATIVE PERCENT: 10 %
NEUTROPHILS ABSOLUTE COUNT: 4.9
NEUTROPHILS RELATIVE PERCENT: 78 %
NUCLEATED RED BLOOD CELLS: 0 (ref 0.0–0.21)
PLATELET COUNT: 179 10*3/uL (ref 150–400)
RED BLOOD CELL COUNT: 3.56 MIL/uL — ABNORMAL LOW (ref 4.22–5.811)
RED CELL DISTRIBUTION WIDTH: 16.2 % — ABNORMAL HIGH (ref 11.5–15.51)
WHITE BLOOD CELL COUNT: 6.2

## 2020-12-25 LAB — BASIC METABOLIC PANEL
ANION GAP: 5 (ref 5–151)
Anion gap: 5 (ref 5–15)
BLOOD UREA NITROGEN: 44 — ABNORMAL HIGH
BUN: 44 mg/dL — ABNORMAL HIGH (ref 6–20)
CALCIUM: 9.7 mg/dL (ref 6.9–10.3)
CHLORIDE: 114 mmol/L — ABNORMAL HIGH (ref 98–111)
CO2: 22 mmol/L (ref 22–32)
CO2: 22 mmol/L (ref 22–32)
CREATININE: 3.01 mg/dL — ABNORMAL HIGH (ref 0.61–1.241)
Calcium: 9.7 mg/dL (ref 8.9–10.3)
Chloride: 114 mmol/L — ABNORMAL HIGH (ref 98–111)
Creatinine, Ser: 3.01 mg/dL — ABNORMAL HIGH (ref 0.61–1.24)
EGFR CKD-EPI AA MALE: 24 mL/min — ABNORMAL LOW
GFR, Estimated: 24 mL/min — ABNORMAL LOW (ref >60)
GLUCOSE RANDOM: 193 mg/dL — ABNORMAL HIGH (ref 70–99)
Glucose, Bld: 193 mg/dL — ABNORMAL HIGH (ref 70–99)
MAGNESIUM: 1.9 mg/dL (ref 1.7–2.4)
PHOSPHORUS: 4 mg/dL (ref 2.5–4.6)
POTASSIUM: 5.3 mmol/L — ABNORMAL HIGH (ref 3.5–5.11)
Potassium: 5.3 mmol/L — ABNORMAL HIGH (ref 3.5–5.1)
SODIUM: 141 mmol/L (ref 135–1451)
Sodium: 141 mmol/L (ref 135–145)

## 2020-12-25 LAB — CBC WITH DIFFERENTIAL/PLATELET
Abs Immature Granulocytes: 0.04 10*3/uL (ref 0.00–0.07)
Basophils Absolute: 0.1 10*3/uL (ref 0.0–0.1)
Basophils Relative: 1 %
Eosinophils Absolute: 0.2 10*3/uL (ref 0.0–0.5)
Eosinophils Relative: 3 %
HCT: 33.4 % — ABNORMAL LOW (ref 39.0–52.0)
Hemoglobin: 9.8 g/dL — ABNORMAL LOW (ref 13.0–17.0)
Immature Granulocytes: 1 %
Lymphocytes Relative: 7 %
Lymphs Abs: 0.4 10*3/uL — ABNORMAL LOW (ref 0.7–4.0)
MCH: 27.5 pg (ref 26.0–34.0)
MCHC: 29.3 g/dL — ABNORMAL LOW (ref 30.0–36.0)
MCV: 93.8 fL (ref 80.0–100.0)
Monocytes Absolute: 0.6 10*3/uL (ref 0.1–1.0)
Monocytes Relative: 10 %
Neutro Abs: 4.9 10*3/uL (ref 1.7–7.7)
Neutrophils Relative %: 78 %
Platelets: 179 10*3/uL (ref 150–400)
RBC: 3.56 MIL/uL — ABNORMAL LOW (ref 4.22–5.81)
RDW: 16.2 % — ABNORMAL HIGH (ref 11.5–15.5)
WBC: 6.2 10*3/uL (ref 4.0–10.5)
nRBC: 0 % (ref 0.0–0.2)

## 2020-12-25 LAB — TACROLIMUS LEVEL: Tacrolimus (FK506) - LabCorp: 13.1 ng/mL (ref 2.0–20.0)

## 2020-12-25 LAB — MAGNESIUM: Magnesium: 1.9 mg/dL (ref 1.7–2.4)

## 2020-12-25 LAB — PHOSPHORUS: Phosphorus: 4 mg/dL (ref 2.5–4.6)

## 2020-12-25 MED ORDER — TACROLIMUS 1 MG CAPSULE, IMMEDIATE-RELEASE
ORAL_CAPSULE | Freq: Two times a day (BID) | ORAL | 11 refills | 30 days | Status: CP
Start: 2020-12-25 — End: 2021-12-25
  Filled 2021-01-03: qty 300, 30d supply, fill #0

## 2020-12-26 LAB — HCV RNA DIAGNOSIS, NAA
HCV RNA, Quantitation: 829000 IU/mL
HCV RNA, log10: 5.919 log10 IU/mL

## 2020-12-26 NOTE — Unmapped (Signed)
Per Almira Bar D hold 1 dose of tacrolimus and reduce to 5mg  BID    Patient verbalized understanding for medication change and denies any questions    Reports he is feeling well. Reports swelling is gone and he has not had any dizziness. BP 106-130's/60-70. States he is having a hard time cutting chlorthalidone in half. Will bring to clinic Wednesday.   Is drinking about 2-3 16 oz bottles of water and some juice. Encouraged him to keep drinking more. He will.    Went over apt for Wednesday.

## 2020-12-26 NOTE — Unmapped (Addendum)
[facility] Pharmacist has reviewed this new prescription.  Patient was counseled on this dosage change by SW- see epic note from 12/25/20.  Next refill call date adjusted if necessary.          Clinical Assessment Needed For: Dose Change  Medication: Tacrolimus 1mg  capsule  Last Fill Date/Day Supply: 12/09/2020 / 30 days  Refill Too Soon until 12/31/2020  Was previous dose already scheduled to fill: No    Notes to Pharmacist: Will re-test 10/03

## 2020-12-27 ENCOUNTER — Institutional Professional Consult (permissible substitution): Admit: 2020-12-27 | Discharge: 2020-12-27 | Payer: MEDICARE

## 2020-12-27 ENCOUNTER — Ambulatory Visit: Admit: 2020-12-27 | Discharge: 2020-12-27 | Payer: MEDICARE

## 2020-12-27 DIAGNOSIS — Z940 Kidney transplant status: Principal | ICD-10-CM

## 2020-12-27 DIAGNOSIS — Z79899 Other long term (current) drug therapy: Principal | ICD-10-CM

## 2020-12-27 DIAGNOSIS — I151 Hypertension secondary to other renal disorders: Principal | ICD-10-CM

## 2020-12-27 DIAGNOSIS — E1122 Type 2 diabetes mellitus with diabetic chronic kidney disease: Principal | ICD-10-CM

## 2020-12-27 DIAGNOSIS — Z794 Long term (current) use of insulin: Principal | ICD-10-CM

## 2020-12-27 DIAGNOSIS — N2889 Other specified disorders of kidney and ureter: Principal | ICD-10-CM

## 2020-12-27 DIAGNOSIS — I2510 Atherosclerotic heart disease of native coronary artery without angina pectoris: Secondary | ICD-10-CM | POA: Diagnosis not present

## 2020-12-27 DIAGNOSIS — M8660 Other chronic osteomyelitis, unspecified site: Secondary | ICD-10-CM | POA: Diagnosis not present

## 2020-12-27 DIAGNOSIS — D849 Immunodeficiency, unspecified: Secondary | ICD-10-CM | POA: Diagnosis not present

## 2020-12-27 DIAGNOSIS — C649 Malignant neoplasm of unspecified kidney, except renal pelvis: Secondary | ICD-10-CM | POA: Diagnosis not present

## 2020-12-27 DIAGNOSIS — D649 Anemia, unspecified: Secondary | ICD-10-CM | POA: Diagnosis not present

## 2020-12-27 DIAGNOSIS — R5383 Other fatigue: Secondary | ICD-10-CM | POA: Diagnosis not present

## 2020-12-27 DIAGNOSIS — Z23 Encounter for immunization: Secondary | ICD-10-CM | POA: Diagnosis not present

## 2020-12-27 DIAGNOSIS — I959 Hypotension, unspecified: Secondary | ICD-10-CM | POA: Diagnosis not present

## 2020-12-27 DIAGNOSIS — R251 Tremor, unspecified: Secondary | ICD-10-CM | POA: Diagnosis not present

## 2020-12-27 DIAGNOSIS — N189 Chronic kidney disease, unspecified: Secondary | ICD-10-CM | POA: Diagnosis not present

## 2020-12-27 DIAGNOSIS — Z7982 Long term (current) use of aspirin: Secondary | ICD-10-CM | POA: Diagnosis not present

## 2020-12-27 DIAGNOSIS — R197 Diarrhea, unspecified: Secondary | ICD-10-CM | POA: Diagnosis not present

## 2020-12-27 DIAGNOSIS — Z89511 Acquired absence of right leg below knee: Secondary | ICD-10-CM | POA: Diagnosis not present

## 2020-12-27 DIAGNOSIS — I129 Hypertensive chronic kidney disease with stage 1 through stage 4 chronic kidney disease, or unspecified chronic kidney disease: Secondary | ICD-10-CM | POA: Diagnosis not present

## 2020-12-27 DIAGNOSIS — G4733 Obstructive sleep apnea (adult) (pediatric): Secondary | ICD-10-CM | POA: Diagnosis not present

## 2020-12-27 LAB — HEPATITIS C RNA, QUANTITATIVE, PCR
HCV RNA LOG10: 5.2 {Log_IU}/mL — ABNORMAL HIGH (ref <0.00)
HCV RNA(IU): 157237 [IU]/mL — ABNORMAL HIGH (ref <=0)
HCV RNA: DETECTED — AB

## 2020-12-27 LAB — COMPREHENSIVE METABOLIC PANEL
ALBUMIN: 3.8 g/dL (ref 3.4–5.0)
ALKALINE PHOSPHATASE: 118 U/L — ABNORMAL HIGH (ref 46–116)
ALT (SGPT): 28 U/L (ref 10–49)
ANION GAP: 6 mmol/L (ref 5–14)
AST (SGOT): 12 U/L (ref <=34)
BILIRUBIN TOTAL: 0.4 mg/dL (ref 0.3–1.2)
BLOOD UREA NITROGEN: 40 mg/dL — ABNORMAL HIGH (ref 9–23)
BUN / CREAT RATIO: 14
CALCIUM: 9.6 mg/dL (ref 8.7–10.4)
CHLORIDE: 109 mmol/L — ABNORMAL HIGH (ref 98–107)
CO2: 22.8 mmol/L (ref 20.0–31.0)
CREATININE: 2.88 mg/dL — ABNORMAL HIGH
EGFR CKD-EPI (2021) MALE: 25 mL/min/{1.73_m2} — ABNORMAL LOW (ref >=60)
GLUCOSE RANDOM: 202 mg/dL — ABNORMAL HIGH (ref 70–99)
POTASSIUM: 5.8 mmol/L — ABNORMAL HIGH (ref 3.4–4.8)
PROTEIN TOTAL: 6.8 g/dL (ref 5.7–8.2)
SODIUM: 138 mmol/L (ref 135–145)

## 2020-12-27 LAB — URINALYSIS
BILIRUBIN UA: NEGATIVE
GLUCOSE UA: 100 — AB
KETONES UA: NEGATIVE
NITRITE UA: NEGATIVE
PH UA: 6 (ref 5.0–9.0)
PROTEIN UA: NEGATIVE
RBC UA: 8 /HPF — ABNORMAL HIGH (ref <3)
RENAL TUBULAR EPITHELIAL CELLS: <1 /HPF — ABNORMAL HIGH (ref <=0)
SPECIFIC GRAVITY UA: 1.02 (ref 1.005–1.030)
SQUAMOUS EPITHELIAL: <1 /HPF (ref 0–5)
UROBILINOGEN UA: 0.2
WBC UA: 50 /HPF — ABNORMAL HIGH (ref <2)

## 2020-12-27 LAB — CBC W/ AUTO DIFF
BASOPHILS ABSOLUTE COUNT: 0.1 10*9/L (ref 0.0–0.1)
BASOPHILS RELATIVE PERCENT: 0.9 %
EOSINOPHILS ABSOLUTE COUNT: 0.3 10*9/L (ref 0.0–0.5)
EOSINOPHILS RELATIVE PERCENT: 3.9 %
HEMATOCRIT: 31.7 % — ABNORMAL LOW (ref 39.0–48.0)
HEMOGLOBIN: 10.1 g/dL — ABNORMAL LOW (ref 12.9–16.5)
LYMPHOCYTES ABSOLUTE COUNT: 0.3 10*9/L — ABNORMAL LOW (ref 1.1–3.6)
LYMPHOCYTES RELATIVE PERCENT: 4.5 %
MEAN CORPUSCULAR HEMOGLOBIN CONC: 31.8 g/dL — ABNORMAL LOW (ref 32.0–36.0)
MEAN CORPUSCULAR HEMOGLOBIN: 28 pg (ref 25.9–32.4)
MEAN CORPUSCULAR VOLUME: 88.1 fL (ref 77.6–95.7)
MEAN PLATELET VOLUME: 8.1 fL (ref 6.8–10.7)
MONOCYTES ABSOLUTE COUNT: 0.6 10*9/L (ref 0.3–0.8)
MONOCYTES RELATIVE PERCENT: 8.9 %
NEUTROPHILS ABSOLUTE COUNT: 5.6 10*9/L (ref 1.8–7.8)
NEUTROPHILS RELATIVE PERCENT: 81.8 %
PLATELET COUNT: 191 10*9/L (ref 150–450)
RED BLOOD CELL COUNT: 3.6 10*12/L — ABNORMAL LOW (ref 4.26–5.60)
RED CELL DISTRIBUTION WIDTH: 17.6 % — ABNORMAL HIGH (ref 12.2–15.2)
WBC ADJUSTED: 6.8 10*9/L (ref 3.6–11.2)

## 2020-12-27 LAB — PROTEIN / CREATININE RATIO, URINE
CREATININE, URINE: 157.8 mg/dL
PROTEIN URINE: 37.3 mg/dL
PROTEIN/CREAT RATIO, URINE: 0.236

## 2020-12-27 LAB — TACROLIMUS LEVEL, TROUGH: TACROLIMUS, TROUGH: 8.8 ng/mL (ref 5.0–15.0)

## 2020-12-27 LAB — MAGNESIUM: MAGNESIUM: 1.6 mg/dL (ref 1.6–2.6)

## 2020-12-27 LAB — PHOSPHORUS: PHOSPHORUS: 3.6 mg/dL (ref 2.4–5.1)

## 2020-12-27 LAB — TACROLIMUS LEVEL: Tacrolimus (FK506) - LabCorp: 11.7 ng/mL (ref 2.0–20.0)

## 2020-12-27 NOTE — Unmapped (Signed)
[facility] TR[facility] PHARMACY NOTE  12/27/2020   Nicholas Strong  [id number]    Medication changes today:   1. Increase chlorthalidone to 25 mg (previously 12.5 mg)  2. Stop amlodipine 5 mg daily  3. Increase Lantus to 50U every morning (previously 45U)  4. Received flu vaccine today    Education/Adherence tools provided today:  - Provided updated medication list  - Provided additional education on immunosuppression and transplant related medications including reviewing indications of medications, dosing and side effects  - Provided additional education on diabetes management  - Provided additional pill box education    Follow up items:  1. Goal of understanding indications and dosing of immunosuppression medications  2. BG  3. BP  4. Mg/K+  5. HCV RNA    Next visit with pharmacy in 1-2 weeks  ____________________________________________________________________    Nicholas Strong is a 55 y.o. male s/p deceased kidney transplant on 2020-12-19 (Kidney) 2/2 T2DM polyneuropathy, renal stone, and RCC (s/p left nephrectomy).     Immunologic Risk: cPRA 0,, HLA MM 5/6, and first transplant    Induction Agent : thymoglobulin    Donor Factors: KDPI 70%, DBD and HCV NAT+    Other PMH significant for Hypertension, CAD and Hypothyroidism  Post op course complicated by [place] on POD0 showed elevated velocity in the main renal artery at the level of the anastomosis, likely secondary to post-op edema    Rejection History: NTD  Infection History: NTD  ___________________________________________________________________    Last seen by pharmacy 6 days ago    Interval History: NTD    Seen by pharmacy today for: medication management, blood glucose management and education and pill box fill and adherence education    CC:  Patient complains of     General: No issues   Neuro: Tremors (mild, recently noticed)  CV: No issues  Resp: No issues  GI: Diarrhea and Nausea (isolated incidence of nausea and diarrhea this morning, resolved)  GU: No issues  Derm: No issues  Psych: No issues.    Fluid Status:   Meds: chlorthalidone 12.5 mg daily  Edema no (resolved with chlorthalidone and elevating legs at night), SOB no  Intake: seems ~60 oz daily (struggling to increase intake after previous restriction with dialysis)  Output: not recording, urinating regularly   Plan: Increased chlorthalidone to 25 mg. Fluid goal of at least 80 oz. Continue to monitor.      Vitals:    12/27/20 0951   BP: 117/61   Pulse: 72   Temp: 35.9 ??C (96.7 ??F)     ___________________________________________________________________    Allergies   Allergen Reactions   ??? Ferric Citrate Nausea And Vomiting       Medications reviewed in EPIC medication station and updated today by the clinical pharmacist practitioner.    Current Outpatient Medications   Medication Instructions   ??? acetaminophen (TYLENOL) 500-1,000 mg, Oral, Every 6 hours PRN   ??? acyclovir (ZOVIRAX) 400 mg, Oral, 2 times a day (standard)   ??? amLODIPine (NORVASC) 10 mg, Oral, Daily (standard)   ??? aspirin (ECOTRIN) 81 mg, Oral, Daily (standard)   ??? carvediloL (COREG) 25 mg, Oral, 2 times a day (standard)   ??? chlorthalidone (HYGROTON) 12.5 mg, Oral, Every morning   ??? cyanocobalamin (VITAMIN B-12) 100 mcg, Oral, Daily (standard)   ??? diphenhydrAMINE (BENADRYL) 25 mg, Oral, Nightly PRN   ??? docusate sodium (COLACE) 100 mg, Oral, 2 times a day PRN   ??? famotidine (PEPCID) 20 mg, Oral,  Daily PRN   ??? insulin glargine (BASAGLAR, LANTUS) 45 Units, Subcutaneous, Nightly   ??? insulin lispro (HUMALOG) 100 unit/mL injection pen Inject 3 units with breakfast and lunch. Plus sliding scale insulin TID with meals (2 units for every 50 > 150). Max daily dose: 42 units   ??? levothyroxine (SYNTHROID) 75 mcg, Oral, Daily (standard)   ??? magnesium oxide-Mg AA chelate (MAGNESIUM, AMINO ACID CHELATE,) 133 mg 1 tablet, Oral, 2 times a day (standard)   ??? melatonin 5 mg tablet Oral, Nightly PRN   ??? mycophenolate (MYFORTIC) 540 mg, Oral, 2 times a day (standard), Adjust dose per medication card.   ??? nitroglycerin (NITROSTAT) 0.4 mg, Sublingual, Continuous PRN, Maximum of 3 doses in 15 minutes.    ??? OZEMPIC 0.5 mg, Subcutaneous, Every 7 days   ??? polyethylene glycol (GLYCOLAX) 17 gram/dose powder Mix 1 capful (17 grams) in 4-8 ounces of water,juice or tea and drink daily as needed.   ??? rosuvastatin (CRESTOR) 10 mg, Oral, Daily (standard)   ??? sulfamethoxazole-trimethoprim (BACTRIM) 400-80 mg per tablet 80 mg of trimethoprim, Oral, 3 times weekly   ??? tacrolimus (PROGRAF) 5 mg, Oral, 2 times a day         GRAFT FUNCTION: improving  Lab Results   Component Value Date    Creatinine 2.88 (H) 12/27/2020    Creatinine 3.01 (H) 12/25/2020    Creatinine 3.09 (H) 12/22/2020    Creatinine 2.88 (H) 12/20/2020       Baseline Scr: TBD  Scr nadir: NTD  Estimated Creatinine Clearance: 33.7 mL/min (A) (based on SCr of 2.88 mg/dL (H)).    Proteinuria/UPC: Yes: 0.236 (12/27/20).  Lab Results   Component Value Date    Protein/Creatinine Ratio, Urine 0.236 12/27/2020    Protein/Creatinine Ratio, Urine 0.53 (H) 12/11/2020    Protein/Creatinine Ratio, Urine 0.744 12/08/2020       DSA: ntd  No results found for: DSAPT, DSACM, DSA1C    Zero hour biopsy:       Biopsies to date: NTD      CURRENT IMMUNOSUPPRESSION:    Tacrolimus (Prograf) 5 mg BID  Tacrolimus Goal: 8 - 10   Mycophenolate sodium (Myfortic) 540 mg BID      IMMUNOSUPPRESSION DRUG LEVELS:  Lab Results   Component Value Date    Tacrolimus, Trough 8.0 12/09/2020    Tacrolimus, Trough 6.0 12/08/2020    Tacrolimus, Trough 6.8 12/07/2020    Tacrolimus, Timed 15.6 12/20/2020    Tacrolimus, Timed 12.7 12/14/2020     Prograf level is accurate 12 hour trough, took tac at 9pm last night     Patient is tolerating immunosuppression well    WBC/ANC:  wnl  Lab Results   Component Value Date    WBC 6.8 12/27/2020    WBC 6.2 12/25/2020       Plan: Will maintain current immunosuppression. Continue to monitor.      OI Prophylaxis:   CMV Status: D- /R-, low risk.   CMV prophylaxis: acyclovir 400 mg bid x 3 months per protocol.  No results found for: CMVCP    PCP Prophylaxis: bactrim SS 1 tab MWF x 6 months.    Thrush: completed in hospital    Patient is  tolerating infectious prophylaxis well    Plan: Continue per protocol. Continue to monitor.    HCV Donor+ Organ  HCV RNA status: Not detected (9/6), >150,000 today  Plan: Genotype will be assessed and hepatology will be engaged for HCV treatment initiation.  CV Prophylaxis: asa 81 mg   The ASCVD Risk Score Denman George DC Montez Hageman, et al., 2013) failed to return a valid value for an unknown reason.  No results found for: LIPID  Statin therapy: Indicated; currently on rosuvastatin 10 mg (switched from prior atorvastatin 40 mg d/t potential use of [facility] outpatient for HCV based on donor HCV NAT+ status)  Plan: no changes. Continue to monitor       BP: Goal < 140/90. Clinic vitals reported above  Home BP ranges:   Date AM BP PM BP   9/22 91/47 124/71   9/23 121/65 132/63   9/24 129/77 143/67   9/25 140/72 128/70   9/26 108/52 133/70   9/27 127/60 109/53     Current meds include: carvedilol 25 mg BID, amlodipine 10 mg daily, chlorthalidone 12.5 mg   Plan: within goal. Increase chlorthalidone to 25 mg daily as patient has persistent issues with high potassium and therefore will benefit from continuing. Stop taking amlodipine 10 mg daily. Continue to monitor    Anemia of CKD:  H/H:   Lab Results   Component Value Date    HGB 10.1 (L) 12/27/2020     Lab Results   Component Value Date    HCT 31.7 (L) 12/27/2020     Iron panel:  No results found for: IRON, TIBC, FERRITIN  No results found for: LABIRON    Prior ESA use: none      Plan: stable. No change.  Continue to monitor.     DM:   Lab Results   Component Value Date    A1C 5.3 12/03/2020   . Goal A1c < 7  History of Dm? Yes: T2DM since 1997  Established with endocrinologist/PCP for BG managment? No  Currently on: Lantus 45U daily (taking in the morning), Humalog 3U with breakfast and lunch + SSI, Ozempic 0.5 mg weekly (restarted 9/16)  Home BS log:   Breakfast Lunch  Dinner  HS   Date [facility]    9/22     190     9/23 211  204    166   9/24 189  197  179  175   9/25 196      214   9/26 230    166  230   9/27 250  179    189     Diet: not much of an appetite, pt eats 2 meals per day (assessing insulin either at lunch or dinner time depending on when he eats)  Exercise: not assessed  Hypoglycemia: no  Plan: Increase Lantus to 50U every morning. Consider increasing Ozempic to 1 mg weekly at next appointment (injecting dose #3 of 0.5 mg on 9/30). Continue to monitor.      Electrolytes: K is elevated   Lab Results   Component Value Date    Potassium 5.8 (H) 12/27/2020    Sodium 138 12/27/2020    Magnesium 1.6 12/27/2020    CO2 22.8 12/27/2020    Phosphorus 3.6 12/27/2020     Meds currently on: none  Plan: No change.  Continue to monitor     GI/BM: pt reports normal BMs outside of a couple isolated incidences of diarrhea; endorsed some nausea this morning which resolved before his appt  Meds currently on: docusate PRN (taking as needed), Miralax PRN (not using), famotidine 20 mg PRN (not needed)  Plan: No change.  Continue to monitor    Pain: pt reports moderate pain associated  with a sore that is on his amputation site that has been present for ~3 years. He expresses that he needs to have an operation in order to shave off the bone at the site. Discussed that he mentioned this is Dr. Norma Fredrickson last week.  Meds currently on: APAP PRN (using occasionally for pain at amputation site)  Plan: No change. Continue to monitor    Bone health:   Vitamin D Level: none available. Goal > 30.   No results found for: VITDTOT, VITDTOTAL, VITDTOTAL, VITDTOTAL, VITDTOTAL    Lab Results   Component Value Date    Calcium 9.6 12/27/2020    Calcium 9.7 12/25/2020     Last DEXA results:  none available  Current meds include: none   Plan: Vitamin D level  needs to be drawn with next lab schedule 3 months after transplant,assess supplementation needs. Continue to monitor.     Women's/Men's Health:  Keola Koker is a 55 y.o. male. Patient reports no men's/women's health issues  Plan: Continue to monitor    Hypothyroidism:  Current meds: levothyroxine 75 mcg  Plan: no changes. Continue to monitor and follow-up with TSH.    Sleep:  Patient is sleeping okay since discharge (waking up at night to pee often), not taking anything for sleep  Current meds: benadryl (not taking), melatonin 5 mg PRN (not taking)  Plan: no changes. Continue to monitor    Immunizations:  Influenza [Annual]: Received 2021, 12/27/20    PCV13: Never received  PPSV23: Received 05/2015  PCV20: Never received    Shingrix Zoster [2 doses, 2 - 6 months apart]: Never received    COVID-19 [3 primary doses, 2 boosters]: 1st dose given 06/11/19, 2nd dose given 07/07/19, 3rd dose given 12/31/19 and Booster given 08/31/20    Immunization status: needs  bivalent COVID booster in the community    Pharmacy preference:  Shifting to using [facility] for all meds (was previously using [facility])  Medication Refills:  n/a  Medication Access:  n/a    Adherence: Patient has average understanding of medications; was not able to independently identify names/doses of immunosuppressants and OI meds.  Patient  does fill their own pill box on a regular basis at home.  Patient brought medication card:yes  Pill box:did not bring  Plan: become more familiar with medications; provided moderate adherence counseling/intervention    Patient was reviewed with Dr. Elvera Maria who was agreement with the stated plan:     During this visit, the following was completed:   BG log data assessment  BP log data assessment  Labs ordered and evaluated  complex treatment plan >1 DS   I spent a total of 30 minutes face to face with the patient delivering clinical care and providing education/counseling.    All questions/concerns were addressed to the patient's satisfaction.  __________________________________________  Erven Colla, PharmD [phone]    Olivia Mackie, PharmD, BCTXP, BCPS, CPP  Solid Organ Transplant Clinical Pharmacist Practitioner

## 2020-12-27 NOTE — Unmapped (Addendum)
Today:  - influenza vaccine  - we will see what your tacrolimus level is today and whether any change is needed    Blood pressure plan:  - STOP amlodipine  - increase chlorthalidone to 25 mg daily  - continue carvedilol 25 mg twice daily  - goal BP is 130/80 or less. Please call if you have BP lower than 100 or if you have low BP with dizziness or other symptoms    Diabetes plan:  - increase your Lantus to 50 units once daily (from 45 units)  - no change today to Humalog or Ozempic, continue those without a change (we can consider increasing Ozempic dose at the next visit)  - consider technology that helps you avoid low blood sugar (continuous glucose monitoring)    Allergic rhinitis:  - recommend Zyrtec (cetirizine) 10 mg daily. Available over-the-counter    Labs twice per week (Mondays and Thursdays)  Return visit 2weeks

## 2020-12-27 NOTE — Unmapped (Signed)
[place] [facility]   TRANSPLANT FOLLOW UP     PCP: Megan Salon, MD   Kidney transplant coordinator: Laury Deep     Date of Visit at Transplant clinic: 12/27/2020     Assessment/Recommendations:   Nicholas Strongis a 55 y.o.??male??with??history of??HTN,??ESRD secondary to diabetes mellitus??Polyneuropathy,??Renal Stone,??and Renal Cell Ca (S/p Left nephrectomy) who is s/p R kidney allotransplant on 12/04/2020 and is being seen for follow up.     # s/p donor kidney transplant 12/04/20  12/04/2020 (Kidney)  Graft function: Cr at 3; monitor closely   DSAs: Not checked yet   Post-surgical issues:  - aspirin for 1 year post-op   - ureteral stent removal rescheduled in October   - JP drains removed   - Staples will be removed in 2 weeks  - Got flu shot today      # Immunosuppression  Tacrolimus (Prograf) 5 mg BID, goal trough 8-10 ng/mL  Mycophenolate (Myfortic) 540 mg BID    # Acute issues today  1. Dizziness with low Bps  2. Mild hand tremors  3. Intermittent nausea and diarrhea  4. Fatigue    # BP management   Goal 130/80, as tolerated. Today, BP at 117/61  -Stop amlodipine   -Continue carvedilol 25 mg BID  -Increased dose of chlorthalidone from 12.5 back to 25 mg daily     # Infectious disease  EBV D +/R+, CMV D-/R-  CMV ppx: Acyclovir 400 mg BID   PJP ppx: Bactrim 400-80 mg per tablet, Three times weekly  Last CMV checked: 12/05/20, was negative     # Anemia screening  Hg 10.1, improved from 9.8    # Cardiovascular:  The ASCVD Risk Score Denman George DC Jorge Ny al., 2013) failed to return a valid value for an unknown reason.    # Electrolytes  Mg 1.6   K 5.8    # Comorbidities  RCC s/p left nephrectomy in 2017  CAD s/p PCI   HTN - chlorthalidone, carvedilol   OSA on CPAP  DM2 - Increased Basaglar to 50 units nightly, continue SSI   Allergies-Stop taking Benadryl, Start Zyrtec   Right BKA (2016, chronic osteomyelitis)       # Immunizations  Immunization History   Administered Date(s) Administered   ??? COVID-19 VAC,MRNA,TRIS(12Y UP)(PFIZER)(GRAY CAP) 08/31/2020   ??? COVID-19 VACC,MRNA,(PFIZER)(PF)(IM) 06/11/2019, 07/07/2019, 12/31/2019, 08/31/2020   ??? Hepatitis B, Adult 12/30/2014, 01/27/2015, 03/01/2015, 06/30/2015   ??? INFLUENZA TIV (TRI) 77MO+ W/ PRESERV (IM) 07/31/2011, 01/31/2020   ??? Influenza Vaccine Quad (IIV4 PF) 30mo+ injectable 12/27/2020   ??? Influenza Virus Vaccine, unspecified formulation 01/19/2020   ??? PNEUMOCOCCAL POLYSACCHARIDE 23 05/22/2015   ??? TdaP 03/01/2017       # Cancer screening  Colonoscopy: Recommended  Skin: recommend yearly dermatology evaluation    # Follow up:  Labs: HCV RNA/ Tacrolimus level/cytology-urine/CMP/Protein-creatinine ratio/ Urine culture/UA/Phosphate/ CBC with Diff/ Mg   Visits: BP      Kidney Transplant History:   Date of Transplant: 12/04/2020 (Kidney)  Type of Transplant: DDKT, DBD  KDPI: 70%  Cold ischemic time: 1,159 minutes (19 hr 19 min)  Warm ischemic time: 188 minutes  cPRA: 0%   HLA match: 5/6 mismatch   Blood type: Donor O, Recipient O POS  ID: CMV D - /R -, EBV D+/R+, HCV donor Ab +, NAT +  Zero-Hour Biopsy: Arteriosclerosis, 10-15% IFTA and mesangial staining of C1q, mild ATI  Native Kidney Disease: DM2, HTN, RCC s/p left nephrectomy   Native  kidney biopsy: None    Pre-transplant dialysis course: HD since 09/08/2014  Post-Transplant Course:    Delayed graft function requiring dialysis: None   Other complications: None   Prior Transplants: None  Induction: Thymoglobulin   Early steroid withdrawal: Yes  Rejection Episodes: None     History of Presenting Illness:   Nicholas Strong is a 55 y.o. male with a history of??HTN,??ESRD secondary to diabetes mellitus??Polyneuropathy,??Renal Stone,??and Renal Cell Ca (S/p Left nephrectomy) who is s/p R kidney allotransplant on 12/04/2020. Patient complains of mild tremor, intermittent nausea and diarrhea. Has fatigue, and occasional lightheadedness mostly related to low blood pressures. Has lost weight, but appetite is good and tries to hydrate more during the day, 3-5 oz/bottles/day.  Patient is concerned about his high glucose levels. Agrees on modifying diabetic medical management and considers going back to Endocrinologist to get CGM in future. His stents will be removed on 10/4 and staples will be removed in 2 weeks during clinical visit. Denies fevers, chills, sweating, problems with urination or bowel movements. Patient has discomfort and ulcer on his prosthetic site and expects repair after couple of weeks or month depending on his immunosuppression. Since he had episodes of low blood pressure, amlodipine is stopped and carvedilol, chlorthalidone is continued. Denies dysuria, urinary frequency, hematuria, abdominal or flank pain.     Concerns about nonadherence: None     Social: Never smoked. Lives with his wife.     Review of Systems:   A 12-system review was negative except as documented in the HPI.    Physical Exam:     BP 117/61 (BP Site: R Arm, BP Position: Sitting, BP Cuff Size: Large)  - Pulse 72  - Temp 35.9 ??C (96.7 ??F) (Temporal)  - Ht 170.2 cm (5' 7.01)  - Wt (!) 106.6 kg (235 lb)  - BMI 36.80 kg/m??    General Appearance: well appearing adult male in no acute distress. Alert and oriented x 3.   Head:  Normocephalic, atraumatic.  Eyes: Conjunctiva and lids appear normal. Pupils equal, round, and reactive to light. Sclera anicteric.  Nose: Nares grossly normal, no drainage.  Neck: Supple, symmetrical. No appreciable thyromegaly or nodules.   Pulmonary: Normal respiratory effort. Lungs clear to ausculation bilaterally. No rhonchi. No wheezes.  Cardiovascular: Regular rate and rhythm. No murmurs, rubs or gallops appreciated. Palpable radial, femoral, and DP pulses  Abdomen: Soft, non-tender, without masses. No hepatosplenomegaly. No hernias. No signs of prior incisional scars  Musculoskeletal: Extremities without clubbing, cyanosis or edema. S/p right BKA  Neurologic:  No motor abnormalities noted. Sensation grossly intact.  Lymphatic: No cervical or supraclavicular lymphadenopathy.   Skin:  Skin color normal. No rashes or lesions. No Jaundice  Psychiatric: Judgement and insight seem appropriate. Oriented to person, place and time.    Allergies:   Allergies   Allergen Reactions   ??? Ferric Citrate Nausea And Vomiting        Current Medications:   Current Outpatient Medications   Medication Sig Dispense Refill   ??? acetaminophen (TYLENOL) 500 MG tablet Take 1-2 tablets (500-1,000 mg total) by mouth every six (6) hours as needed for pain or fever. 60 tablet 11   ??? acyclovir (ZOVIRAX) 400 MG tablet Take 1 tablet (400 mg total) by mouth Two (2) times a day. 60 tablet 2   ??? amLODIPine (NORVASC) 10 MG tablet Take 1 tablet (10 mg total) by mouth daily. 30 tablet 11   ??? aspirin (ECOTRIN) 81 MG tablet Take 1 tablet (  81 mg total) by mouth daily. 30 tablet 11   ??? carvediloL (COREG) 25 MG tablet Take 1 tablet (25 mg total) by mouth Two (2) times a day. 60 tablet 11   ??? chlorthalidone (HYGROTON) 25 MG tablet Take 0.5 tablets (12.5 mg total) by mouth every morning. 15 tablet 11   ??? cyanocobalamin (VITAMIN B-12) 100 MCG tablet Take 1 tablet (100 mcg total) by mouth daily. 100 tablet 11   ??? diphenhydrAMINE (BENADRYL) 25 mg capsule Take 1 capsule (25 mg total) by mouth nightly as needed for itching. 48 capsule 0   ??? docusate sodium (COLACE) 100 MG capsule Take 1 capsule (100 mg total) by mouth two (2) times a day as needed for constipation. 60 capsule 11   ??? famotidine (PEPCID) 20 MG tablet Take 1 tablet (20 mg total) by mouth daily as needed for heartburn. 30 tablet 11   ??? insulin glargine (BASAGLAR, LANTUS) 100 unit/mL (3 mL) injection pen Inject 0.45 mL (45 Units total) under the skin nightly. 15 mL 9   ??? insulin lispro (HUMALOG) 100 unit/mL injection pen Inject 3 units with breakfast and lunch. Plus sliding scale insulin TID with meals (2 units for every 50 > 150). Max daily dose: 42 units 15 mL 11   ??? levothyroxine (SYNTHROID) 75 MCG tablet Take 1 tablet (75 mcg total) by mouth daily. 30 tablet 11   ??? magnesium oxide-Mg AA chelate (MAGNESIUM, AMINO ACID CHELATE,) 133 mg Take 1 tablet by mouth Two (2) times a day. 100 tablet 11   ??? melatonin 5 mg tablet Take by mouth nightly as needed. 60 tablet 5   ??? mycophenolate (MYFORTIC) 180 MG EC tablet Take 3 tablets (540 mg total) by mouth Two (2) times a day. Adjust dose per medication card. 180 tablet 11   ??? nitroglycerin (NITROSTAT) 0.4 MG SL tablet Place 1 tablet (0.4 mg total) under the tongue continuous as needed for chest pain. Maximum of 3 doses in 15 minutes. 25 tablet 1   ??? polyethylene glycol (GLYCOLAX) 17 gram/dose powder Mix 1 capful (17 grams) in 4-8 ounces of water,juice or tea and drink daily as needed. 510 g 11   ??? rosuvastatin (CRESTOR) 10 MG tablet Take 1 tablet (10 mg total) by mouth daily. 30 tablet 11   ??? semaglutide (OZEMPIC) 0.25 mg or 0.5 mg(2 mg/1.5 mL) PnIj injection Inject 0.5 mg under the skin every seven (7) days. 1.5 mL 11   ??? sulfamethoxazole-trimethoprim (BACTRIM) 400-80 mg per tablet Take 1 tablet (80 mg of trimethoprim total) by mouth 3 (three) times a week. 12 tablet 5   ??? tacrolimus (PROGRAF) 1 MG capsule Take 5 capsules (5 mg total) by mouth two (2) times a day. 300 capsule 11     No current facility-administered medications for this visit.       Past Medical History:   Past Medical History:   Diagnosis Date   ??? Cancer (CMS-HCC)    ??? Chronic kidney disease    ??? Diabetes mellitus (CMS-HCC)         Laboratory studies:   Reviewed recent results.        Electronically signed by:   Yehuda Savannah, MD, PGY1  Internal Medicine  [facility]

## 2020-12-27 NOTE — Unmapped (Signed)
AOBP:  Right arm Large  cuff     1st reading:  118/61  73  2nd reading: 122/60  71  3rd reading:  111/61  71    Average:  117/61  72      96.7

## 2020-12-27 NOTE — Unmapped (Signed)
Assessment    Met w/ patient and wife in [facility] today. Reviewed meds/symptoms. Any new medications? Med changes per pharmacy                Pt reports no fever/cold/flu symptoms    BP: 117/61 today/ Home BP reported 110-120/60's   BG: 202 today, running 160-200's at home   Headache/Dizziness/Lightheaded: dizziness sometimes with lower BP's   Hand tremors: mild tremors   Numbness/tingling: denies   Fevers/chills/sweats: denies   CP/SOB/palpatations: denies   Nausea/vomiting/heartburn: intermittent nausea and diarrhea, stopped colace last week and pepcid PRN   UTI symptoms (burn/pain/itch/frequency/urgency/odor/color/foam): denies   No visible or palpable edema    Appetite good; reports adequate hydration. 3-5 oz/bottles/fluid per day.  Provided education on decaf drinks and increased water intake    Pt reports being well rested and getting adequate exercise despite Covid 19 quarantine. Taking care to mask, hand hygeine and minimal public activity. Offered support and guidance for this process given his immune suppressed state. Also discussed reduced covid vaccine coverage for transplant patients and importance of continuing to mask and practice safe distancing. Commented that a booster vaccine may be advised in the near future.      Pain: denies   Incision: stapes still intact  *Urology cancelled his stent removal, pt rescheduled to 10/4 at 10:15.     Last tac taken 2100; held for this morning's labs. Current dose 5mg  bid/daily    * changes labs to twice weekly - Monday and Thursday    Referrals needed:  Pt will plan to see his Endocrinologist in [place] for diabetes follow up.          Immunization status: flu shot today, will need booster      Functional Score: 100

## 2020-12-28 DIAGNOSIS — Z940 Kidney transplant status: Principal | ICD-10-CM

## 2020-12-28 LAB — MISC LABCORP TEST (SEND OUT): Labcorp test code: 550475

## 2020-12-28 NOTE — Unmapped (Signed)
[facility] Specialty Medication Onboarding    Specialty Medication: Mycophenolate 180mg  EC tablet  Prior Authorization: Not Required   Financial Assistance: No - copay  <$25  Final Copay/Day Supply: $31.23 / 30 days    Insurance Restrictions: None     Notes to Pharmacist: N/A    The triage team has completed the benefits investigation and has determined that the patient is able to fill this medication at [facility]. Please contact the patient to complete the onboarding or follow up with the prescribing physician as needed.

## 2020-12-29 DIAGNOSIS — B1710 Acute hepatitis C without hepatic coma: Principal | ICD-10-CM

## 2020-12-29 NOTE — Unmapped (Signed)
I saw and evaluated the patient with Dr. Rosendo Gros, participating in the key portions of the service. I reviewed the patient's laboratory data and other clinical information. I agree with the history, examination, and medical decision making as noted.    Dewitt Hoes, MD, [facility]  Assistant Professor of Medicine and Pediatrics  Division of Nephrology and Hypertension  [facility]

## 2020-12-31 MED ORDER — CHLORTHALIDONE 25 MG TABLET
ORAL_TABLET | Freq: Every morning | ORAL | 11 refills | 30.00000 days | Status: CP
Start: 2020-12-31 — End: 2021-12-31
  Filled 2021-01-08: qty 30, 30d supply, fill #0

## 2020-12-31 MED ORDER — INSULIN GLARGINE (U-100) 100 UNIT/ML (3 ML) SUBCUTANEOUS PEN
Freq: Every evening | SUBCUTANEOUS | 9 refills | 30.00000 days | Status: CP
Start: 2020-12-31 — End: ?

## 2021-01-01 ENCOUNTER — Other Ambulatory Visit (HOSPITAL_COMMUNITY)
Admission: RE | Admit: 2021-01-01 | Discharge: 2021-01-01 | Disposition: A | Discharge status: Home / Self Care | Payer: Medicare Other | Source: Ambulatory Visit | Attending: Pediatric Nephrology | Admitting: Pediatric Nephrology

## 2021-01-01 ENCOUNTER — Institutional Professional Consult (permissible substitution): Admit: 2021-01-01 | Discharge: 2021-01-02 | Payer: MEDICARE

## 2021-01-01 DIAGNOSIS — Z940 Kidney transplant status: Principal | ICD-10-CM

## 2021-01-01 DIAGNOSIS — R75 Inconclusive laboratory evidence of human immunodeficiency virus [HIV]: Principal | ICD-10-CM

## 2021-01-01 DIAGNOSIS — D899 Disorder involving the immune mechanism, unspecified: Secondary | ICD-10-CM | POA: Diagnosis not present

## 2021-01-01 LAB — CBC W/ DIFFERENTIAL
BASOPHILS ABSOLUTE COUNT: 0 10*3/uL
BASOPHILS RELATIVE PERCENT: 1
EOSINOPHILS ABSOLUTE COUNT: 0.3 10*3/uL
EOSINOPHILS RELATIVE PERCENT: 4 %
HEMATOCRIT: 32.5 — ABNORMAL LOW (ref 39.0–52.01)
HEMOGLOBIN: 9.9 g/dL — ABNORMAL LOW (ref 13.0–17.0)
LYMPHOCYTES ABSOLUTE COUNT: 0.4 10*3/uL — ABNORMAL LOW
LYMPHOCYTES RELATIVE PERCENT: 7 %
MEAN CORPUSCULAR HEMOGLOBIN CONC: 30.5
MEAN CORPUSCULAR HEMOGLOBIN: 28
MEAN CORPUSCULAR VOLUME: 91.8 fL
MONOCYTES ABSOLUTE COUNT: 0.7 10*3/uL (ref 0.1–1.0)
MONOCYTES RELATIVE PERCENT: 11 %
NEUTROPHILS RELATIVE PERCENT: 76 %
NUCLEATED RED BLOOD CELLS: 0
PLATELET COUNT: 190 10*3/uL
RED BLOOD CELL COUNT: 3.54 MIL/uL — ABNORMAL LOW (ref 4.22–5.61)
RED CELL DISTRIBUTION WIDTH: 16.3 — ABNORMAL HIGH (ref 11.5–15.5)
WHITE BLOOD CELL COUNT: 6 (ref 4.0–10.5)

## 2021-01-01 LAB — RENAL FUNCTION PANEL
ANION GAP: 8 (ref 5–15)
BLOOD UREA NITROGEN: 46 mg/dL — ABNORMAL HIGH (ref 6–20)
CALCIUM: 9.3 mg/dL (ref 8.9–10.3)
CHLORIDE: 106 mmol/L (ref 98–1111)
CO2: 23 mmol/L (ref 22–32)
CREATININE: 2.31 mg/dL — ABNORMAL HIGH (ref 0.61–1.241)
EGFR MDRD AF AMER: 33 mL/min — ABNORMAL LOW
GLUCOSE RANDOM: 142 mg/dL — ABNORMAL HIGH (ref 70–99)
POTASSIUM: 4.8 mmol/L (ref 3.5–5.11)
SODIUM: 137 mmol/L (ref 135–145)

## 2021-01-01 LAB — PHOSPHORUS
PHOSPHORUS: 3.5 mg/dL (ref 2.5–4.6)
Phosphorus: 3.5 mg/dL (ref 2.5–4.6)

## 2021-01-01 LAB — MAGNESIUM
MAGNESIUM: 1.8 (ref 1.7–2.41)
Magnesium: 1.8 mg/dL (ref 1.7–2.4)

## 2021-01-01 LAB — CBC WITH DIFFERENTIAL/PLATELET
Abs Immature Granulocytes: 0.04 10*3/uL (ref 0.00–0.07)
Basophils Absolute: 0 10*3/uL (ref 0.0–0.1)
Basophils Relative: 1 %
Eosinophils Absolute: 0.3 10*3/uL (ref 0.0–0.5)
Eosinophils Relative: 4 %
HCT: 32.5 % — ABNORMAL LOW (ref 39.0–52.0)
Hemoglobin: 9.9 g/dL — ABNORMAL LOW (ref 13.0–17.0)
Immature Granulocytes: 1 %
Lymphocytes Relative: 7 %
Lymphs Abs: 0.4 10*3/uL — ABNORMAL LOW (ref 0.7–4.0)
MCH: 28 pg (ref 26.0–34.0)
MCHC: 30.5 g/dL (ref 30.0–36.0)
MCV: 91.8 fL (ref 80.0–100.0)
Monocytes Absolute: 0.7 10*3/uL (ref 0.1–1.0)
Monocytes Relative: 11 %
Neutro Abs: 4.6 10*3/uL (ref 1.7–7.7)
Neutrophils Relative %: 76 %
Platelets: 190 10*3/uL (ref 150–400)
RBC: 3.54 MIL/uL — ABNORMAL LOW (ref 4.22–5.81)
RDW: 16.3 % — ABNORMAL HIGH (ref 11.5–15.5)
WBC: 6 10*3/uL (ref 4.0–10.5)
nRBC: 0 % (ref 0.0–0.2)

## 2021-01-01 LAB — BASIC METABOLIC PANEL
Anion gap: 8 (ref 5–15)
BUN: 46 mg/dL — ABNORMAL HIGH (ref 6–20)
CO2: 23 mmol/L (ref 22–32)
Calcium: 9.3 mg/dL (ref 8.9–10.3)
Chloride: 106 mmol/L (ref 98–111)
Creatinine, Ser: 2.31 mg/dL — ABNORMAL HIGH (ref 0.61–1.24)
GFR, Estimated: 33 mL/min — ABNORMAL LOW (ref >60)
Glucose, Bld: 142 mg/dL — ABNORMAL HIGH (ref 70–99)
Potassium: 4.8 mmol/L (ref 3.5–5.1)
Sodium: 137 mmol/L (ref 135–145)

## 2021-01-01 NOTE — Unmapped (Signed)
Therapy Update Follow Up: No issues - Copay = $58.61. Referral returned that no assistance available

## 2021-01-01 NOTE — Unmapped (Signed)
The patient reports they are currently: at home. I spent 45 minutes on the phone with the patient on the date of service. I spent an additional 20 minutes on pre- and post-visit activities on the date of service.     The patient was physically located in [place] or a state in which I am permitted to provide care. The patient and/or parent/guardian understood that s/he may incur co-pays and cost sharing, and agreed to the telemedicine visit. The visit was reasonable and appropriate under the circumstances given the patient's presentation at the time.    The patient and/or parent/guardian has been advised of the potential risks and limitations of this mode of treatment (including, but not limited to, the absence of in-person examination) and has agreed to be treated using telemedicine. The patient's/patient's family's questions regarding telemedicine have been answered.     If the visit was completed in an ambulatory setting, the patient and/or parent/guardian has also been advised to contact their provider???s office for worsening conditions, and seek emergency medical treatment and/or call 911 if the patient deems either necessary.      **THIS PATIENT WAS NOT SEEN IN PERSON TO MINIMIZE POTENTIAL SPREAD OF COVID-19, PROTECT PATIENTS/PROVIDERS, AND REDUCE PPE UTILIZATION.**    PATIENT NAME: Nicholas Strong     MR#: [id number]    DOB: 27-Jul-1965    [facility]  CONFIDENTIAL SOCIAL WORK    KIDNEY POST TRANSPLANT FOLLOW UP      DATE OF EVALUATION: 01/01/2021    INFORMANTS: Patient/Nicholas Strong, spouse/Nicholas Strong    PREFERRED LANGUAGE: English     INTERPRETER UTILIZED: N/A    TXP CARE TEAM:   Post Transplant RN CoordinatorLaury Deep [phone]  Primary Transplant Provider: Bufford Buttner, Anibal Henderson, [facility], Sorabh Kapoor, Edwin Mosetta Putt, Leafy Half, [employer] Dark    REFERRAL INFORMATION:    Mr.  Bevis is a 55 y.o. male is s/p transplant for kidney transplantation . CSW follows up to assess recovery since DC.    TRANSPLANT DATE:   12/04/2020 (Kidney)    MOST RECENT HOSPITAL ADMISSION (@ [place]):   Previous admit date: 12/03/2020 to 12/09/20    FUTURE APPOINTMENTS (@ [place]):   Future Appointments   Date Time Provider Department Center   01/02/2021 10:15 AM Annye English, MD [facility]   01/10/2021 10:00 AM Leafy Half, MD [facility]       HOME HEALTH/DME NEEDS AT LAST DC:   HH: N/A   Services: N/A   Contact: N/A  DME:  prothestic right leg/2016--[employer] fan*  Other: N/A  Other Remaining:   ??? Ureter stent: Yes; schecduled 10/4  ??? Staples: Yes; scheduled 10/12  ??? Surgical drains x0: removed 12/14/20   ??? PD Cath: Yes,  ??? Central line: N/A     LIFESTYLE:  Physical activity:  Good  Nutrition/Appetite:  Good  Sleep: improving; frequency of urination is improving    SOCIAL HISTORY & CAREGIVING PLAN:  Citizenship Status: [place] Citizen  Social History: originally from [place]; moved to [place] ~2015  Marital Status: married  Lives with: spouse/Nicholas  Children/Dependents:  two adult daughters (one in [place], [place] and one in [place]). Also has a granddaughter.    Supports: parents/deceased  Living situation: Apartment, good repair  Transportation: wife/Nicholas    Pt reports that wife/Nicholas has been his primary caregiver and has taken PTO.  It does not appear that she explored FMLA and pt reports  that he is now home alone during the evenings (wife works night shift); she is able to provide transportation during the day.    COMPLIANCE HISTORY:  Medication Adherence: Good; feel coming along  Medication Concerns: denied problems taking medications, concerns about side effects, affordability, problems obtaining medications, and difficulty remembering medications  Other Adherence: Good     Side Effects: none     INSURANCE:  [employer] assistance: N/A  Payer/Plan Subscriber Name Rel Member # Group #   [place] MED* PRANAV, LINCE Self [id number] [phone]      [address] [address]   MEDICAID [place] - MEDICAI* [facility] Self [id number] P       [address] [address]     Dialysis Start Date:  09/08/2014     SSDI eligibility: predate HD    ESRD Medicare eligibility: 12/04/20     65th BD: 05/01/2030    30 mon COB period: expired    Access to other insurance plans?: could be eligible for insurance via wife's employment w/ Forensic scientist; if benefits precede HD, they could remain stable following surgery    INCOME:   Pt/SSDI; reports that his benefits predate HD and were based mostly on issues with his right leg and ultimate amputation.  Pt states that issues began in 2008 with a nonhealing hairline fracture.  He eventually needed ~7-8 different surgeries over the years, was unable to work, and finally had to have the leg amputated.    Wife/employed FT w/ [employer] (night shift).    ATTITUDE ABOUT TRANSPLANT:  Expectations: I guess so...; I've been waiting for this for 6 years.  My goal was just to stop dialysis and that is now out of the way.  A new journey has begun [for me]...  Fears/Concerns: denies, but admits to being impatient during his recovery  What would you change?:  none  Rec'd Info on Ltr to Donor Family: yes; considering    MENTAL HEALTH HISTORY: reflective of current   Current issues/mood: feel has been all right; wife validated, states it has improved since his first week home (less irritable)  Medications: denies  Therapy: denies  SI/HI: denies     SUBSTANCE HISTORY: reflective of current   Tobacco: denies  Alcohol: denies  Illicit Substances: denies  OTC/Supplements: denies    PAIN HISTORY: reflective of current  Current : managemable; improving  Current use of pain medication/pain control: denies     SUMMARY:  Both pt and wife were present for this call and validated all information. Overall offered support and education.  No needs at this time.  Will f/up again after staples and stent have been removed.      Anticipatory guidance/education provided on the following:  At next appt         RECOMMENDATIONS:   1. F/up  After 10/12  2. Verified contact info for this CSW  3. Anticipatory guidance    Lowella Petties, LCSW, CCTSW  Transplant Case Manager/Social Worker  [facility] for Transplant Care  Completed: 01/01/21

## 2021-01-01 NOTE — Unmapped (Signed)
Called patient to confirm Stent removal for tomorrow.  No answer.  Left voicemail with date and time.

## 2021-01-02 ENCOUNTER — Ambulatory Visit: Admit: 2021-01-02 | Discharge: 2021-01-03 | Payer: MEDICARE | Attending: Urology | Primary: Urology

## 2021-01-02 DIAGNOSIS — Z466 Encounter for fitting and adjustment of urinary device: Secondary | ICD-10-CM | POA: Diagnosis not present

## 2021-01-02 DIAGNOSIS — Z940 Kidney transplant status: Secondary | ICD-10-CM | POA: Diagnosis not present

## 2021-01-02 LAB — HCV RNA DIAGNOSIS, NAA
HCV RNA, Quantitation: 457000 IU/mL
HCV RNA, log10: 5.66 log10 IU/mL

## 2021-01-02 MED ADMIN — lidocaine 2% gel (XYLOCAINE) jelly urojet 20 mL: 20 mL | URETHRAL | @ 15:00:00 | Stop: 2021-01-02

## 2021-01-02 MED ADMIN — sodium chloride irrigation (NS) 0.9 % irrigation solution 500 mL: 500 mL | @ 15:00:00 | Stop: 2021-01-02

## 2021-01-02 NOTE — Unmapped (Signed)
Procedure:  Stent removal  Code:Cystoscopy and stent removal, CPT 52310      Indication:  Status post renal transplant    Description:   Time out was performed immediately prior to the procedure.     The patient was prepped and draped in the usual sterile fashion.   Flexible cystosopy was performed. The stent was visualized, grasped, and removed intact without difficulty.   The patient tolerated the procedure well and was given one dose of antibiotic prophylaxis afterwards.     Impression: Status post transplant.  Plan: Follow up in transplant clinic.

## 2021-01-02 NOTE — Unmapped (Signed)
[facility] Clinical Assessment & Refill Coordination Note    Nicholas Strong, DOB: Feb 19, 1966  Phone: [phone] (home)     All above HIPAA information was verified with patient.     Was a Nurse, learning disability used for this call? No    Specialty Medication(s):   Transplant:  mycophenolic acid 180mg  and tacrolimus 1mg      Current Outpatient Medications   Medication Sig Dispense Refill   ??? acetaminophen (TYLENOL) 500 MG tablet Take 1-2 tablets (500-1,000 mg total) by mouth every six (6) hours as needed for pain or fever. 60 tablet 11   ??? acyclovir (ZOVIRAX) 400 MG tablet Take 1 tablet (400 mg total) by mouth Two (2) times a day. 60 tablet 2   ??? aspirin (ECOTRIN) 81 MG tablet Take 1 tablet (81 mg total) by mouth daily. 30 tablet 11   ??? carvediloL (COREG) 25 MG tablet Take 1 tablet (25 mg total) by mouth Two (2) times a day. 60 tablet 11   ??? chlorthalidone (HYGROTON) 25 MG tablet Take 1 tablet (25 mg total) by mouth every morning. 30 tablet 11   ??? cyanocobalamin (VITAMIN B-12) 100 MCG tablet Take 1 tablet (100 mcg total) by mouth daily. 100 tablet 11   ??? diphenhydrAMINE (BENADRYL) 25 mg capsule Take 1 capsule (25 mg total) by mouth nightly as needed for itching. 48 capsule 0   ??? docusate sodium (COLACE) 100 MG capsule Take 1 capsule (100 mg total) by mouth two (2) times a day as needed for constipation. 60 capsule 11   ??? famotidine (PEPCID) 20 MG tablet Take 1 tablet (20 mg total) by mouth daily as needed for heartburn. 30 tablet 11   ??? insulin glargine (BASAGLAR, LANTUS) 100 unit/mL (3 mL) injection pen Inject 0.5 mL (50 Units total) under the skin nightly. 15 mL 9   ??? insulin lispro (HUMALOG) 100 unit/mL injection pen Inject 3 units with breakfast and lunch. Plus sliding scale insulin TID with meals (2 units for every 50 > 150). Max daily dose: 42 units 15 mL 11   ??? levothyroxine (SYNTHROID) 75 MCG tablet Take 1 tablet (75 mcg total) by mouth daily. 30 tablet 11   ??? magnesium oxide-Mg AA chelate (MAGNESIUM, AMINO ACID CHELATE,) 133 mg Take 1 tablet by mouth Two (2) times a day. 100 tablet 11   ??? melatonin 5 mg tablet Take by mouth nightly as needed. 60 tablet 5   ??? mycophenolate (MYFORTIC) 180 MG EC tablet Take 3 tablets (540 mg total) by mouth Two (2) times a day. Adjust dose per medication card. 180 tablet 11   ??? nitroglycerin (NITROSTAT) 0.4 MG SL tablet Place 1 tablet (0.4 mg total) under the tongue continuous as needed for chest pain. Maximum of 3 doses in 15 minutes. 25 tablet 1   ??? polyethylene glycol (GLYCOLAX) 17 gram/dose powder Mix 1 capful (17 grams) in 4-8 ounces of water,juice or tea and drink daily as needed. 510 g 11   ??? rosuvastatin (CRESTOR) 10 MG tablet Take 1 tablet (10 mg total) by mouth daily. 30 tablet 11   ??? semaglutide (OZEMPIC) 0.25 mg or 0.5 mg(2 mg/1.5 mL) PnIj injection Inject 0.5 mg under the skin every seven (7) days. 1.5 mL 11   ??? sulfamethoxazole-trimethoprim (BACTRIM) 400-80 mg per tablet Take 1 tablet (80 mg of trimethoprim total) by mouth 3 (three) times a week. 12 tablet 5   ??? tacrolimus (PROGRAF) 1 MG capsule Take 5 capsules (5 mg total) by  mouth two (2) times a day. 300 capsule 11     No current facility-administered medications for this visit.        Changes to medications: [name] reports no changes at this time.    Allergies   Allergen Reactions   ??? Ferric Citrate Nausea And Vomiting       Changes to allergies: No    SPECIALTY MEDICATION ADHERENCE     Mycophenolate 180 mg: 7 days of medicine on hand   Tacrolimus 1 mg: 7 days of medicine on hand       Medication Adherence    Specialty Medication: Tacrolimus 1mg   Patient is on additional specialty medications: Yes  Additional Specialty Medications: Mycophenolate 180mg   Patient Reported Additional Medication X Missed Doses in the Last Month: 0  Patient is on more than two specialty medications: No          Specialty medication(s) dose(s) confirmed: Regimen is correct and unchanged.     Are there any concerns with adherence? No    Adherence counseling provided? Not needed    CLINICAL MANAGEMENT AND INTERVENTION      Clinical Benefit Assessment:    Do you feel the medicine is effective or helping your condition? Yes    Clinical Benefit counseling provided? Not needed    Adverse Effects Assessment:    Are you experiencing any side effects? No    Are you experiencing difficulty administering your medicine? No    Quality of Life Assessment:         How many days over the past month did your kidney transplant  keep you from your normal activities? For example, brushing your teeth or getting up in the morning. 0    Have you discussed this with your provider? Not needed    Acute Infection Status:    Acute infections noted within Epic:  No active infections  Patient reported infection: None    Therapy Appropriateness:    Is therapy appropriate and patient progressing towards therapeutic goals? Yes, therapy is appropriate and should be continued    DISEASE/MEDICATION-SPECIFIC INFORMATION      N/A    PATIENT SPECIFIC NEEDS     - Does the patient have any physical, cognitive, or cultural barriers? No    - Is the patient high risk? Yes, patient is taking a REMS drug. Medication is dispensed in compliance with REMS program    - Does the patient require a Care Management Plan? No     - Does the patient require physician intervention or other additional services (i.e. nutrition, smoking cessation, social work)? No      SHIPPING     Specialty Medication(s) to be Shipped:   Transplant:  mycophenolic acid 180mg  and tacrolimus 1mg     Other medication(s) to be shipped: acyclovir, asa, carvedilol, chlorthalidone,rosuvastatin,smz-tmp     Changes to insurance: No    Delivery Scheduled: Yes, Expected medication delivery date: 01/04/21.     Medication will be delivered via UPS to the confirmed prescription address in [facility].    The patient will receive a drug information handout for each medication shipped and additional FDA Medication Guides as required.  Verified that patient has previously received a Conservation officer, historic buildings and a Surveyor, mining.    The patient or caregiver noted above participated in the development of this care plan and knows that they can request review of or adjustments to the care plan at any time.      All of the patient's  questions and concerns have been addressed.    Tera Helper   [facility] Specialty Pharmacist

## 2021-01-02 NOTE — Unmapped (Signed)
[facility]:  Taking Care of Yourself After Cystoscopy Procedures    *Drink plenty of water for a day or two following your procedure.  Try to have about 8 ounces (one cup) at a time, and do this 6 times or more per day.  (If you have fluid restrictions, please ask the nurse or doctor for advice).    *AVOID alcoholic, carbonated and caffeinated drinks for a day or two, as they may cause uncomfortable symptoms.    *For the first 8 hours after the procedure, your urine may be pink or red in color.  Small clots or a few drops of blood can be a normal side effect of the instruments.  Large amounts of bleeding or difficulty urinating are not normal.  Call your doctor if this happens.    *You may experience some mild discomfort of a burning sensation with urination after having this procedure.  If it does not improve, or if other symptoms appear (fever, chills, or difficulty emptying), call your doctor.    *You may return to normal daily activities such as work, school, driving, exercising and housework.    *If your doctor gave you a prescription, take it as ordered.    *If you need a return appointment, the secretary will make it for you when you check out. To contact the Urology Clinic during business hours, call [phone].    *[facility] Operator can be reached at [phone] if you need to get in contact with your doctor.  After the hours, the operator can page the doctor on call for urgent concerns:    Urology Patients should ask for the Urology resident “on call”.    You can get more immediate assistance at the Emergency Room or Urgent Care if necessary.

## 2021-01-03 LAB — HEPATITIS C RNA, QUANTITATIVE, PCR
HCV RNA LOG10: 5.66
HCV RNA(IU): 457000
HCV RNA: DETECTED

## 2021-01-03 LAB — TACROLIMUS LEVEL: Tacrolimus (FK506) - LabCorp: 13.6 ng/mL (ref 2.0–20.0)

## 2021-01-03 NOTE — Unmapped (Signed)
Called patient regarding Prograf payment.  Patient inquires about [facility] care for payment assistance versus switching to Envarsus.  Patient states he is agreeable to with we think would be best.  Plan to reach out to team for best plan going forward.

## 2021-01-04 ENCOUNTER — Other Ambulatory Visit (HOSPITAL_COMMUNITY)
Admission: RE | Admit: 2021-01-04 | Discharge: 2021-01-04 | Disposition: A | Discharge status: Home / Self Care | Payer: Medicare Other | Source: Ambulatory Visit | Attending: Pediatric Nephrology | Admitting: Pediatric Nephrology

## 2021-01-04 DIAGNOSIS — D899 Disorder involving the immune mechanism, unspecified: Secondary | ICD-10-CM | POA: Insufficient documentation

## 2021-01-04 LAB — CBC W/ DIFFERENTIAL
BASOPHILS ABSOLUTE COUNT: 0 10*3/uL (ref 0.0–0.1)
BASOPHILS RELATIVE PERCENT: 1 %
EOSINOPHILS ABSOLUTE COUNT: 0.2 10*3/uL (ref 0.0–0.5)
EOSINOPHILS RELATIVE PERCENT: 3 %
HEMATOCRIT: 33.6 — ABNORMAL LOW (ref 39.0–52.0)
HEMOGLOBIN: 10.2 g/dL — ABNORMAL LOW (ref 13.0–17.0)
LYMPHOCYTES ABSOLUTE COUNT: 0.5 10*3/uL — ABNORMAL LOW (ref 0.7–4.0)
LYMPHOCYTES RELATIVE PERCENT: 9 %
MEAN CORPUSCULAR HEMOGLOBIN CONC: 30.4 g/dL (ref 30.0–36.01)
MEAN CORPUSCULAR HEMOGLOBIN: 27.9 pg (ref 26.0–34.0)
MEAN CORPUSCULAR VOLUME: 92.1 (ref 60.0–100.0)
MONOCYTES ABSOLUTE COUNT: 0.7 10*3/uL (ref 0.1–1.0)
MONOCYTES RELATIVE PERCENT: 11 %
NEUTROPHILS RELATIVE PERCENT: 75 %
NUCLEATED RED BLOOD CELLS: 0 (ref 0.0–0.2)
PLATELET COUNT: 182 10*3/uL (ref 150–400)
RED BLOOD CELL COUNT: 3.65 MIL/uL — ABNORMAL LOW (ref 4.22–5.81)
RED CELL DISTRIBUTION WIDTH: 16.5 — ABNORMAL HIGH (ref 11.5–15.5)
WHITE BLOOD CELL COUNT: 5.8 10*3/uL (ref 4.0–10.51)

## 2021-01-04 LAB — RENAL FUNCTION PANEL
ANION GAP: 5 (ref 5–15)
BLOOD UREA NITROGEN: 47 mg/dL — ABNORMAL HIGH
CALCIUM: 9.3 mg/dL (ref 8.9–10.3)
CHLORIDE: 112 mmol/L — ABNORMAL HIGH (ref 98–111)
CO2: 21 mmol/L — ABNORMAL LOW (ref 22–32)
CREATININE: 2.61 mg/dL — ABNORMAL HIGH (ref 0.61–1.24)
EGFR MDRD AF AMER: 28 — ABNORMAL LOW
GLUCOSE RANDOM: 141 mg/dL — ABNORMAL HIGH (ref 70–99)
POTASSIUM: 4.8 mmol/L (ref 3.5–5.11)
SODIUM: 138 mmol/L (ref 135–145)

## 2021-01-04 LAB — PHOSPHORUS
PHOSPHORUS: 3.9 (ref 2.5–4.6)
Phosphorus: 3.9 mg/dL (ref 2.5–4.6)

## 2021-01-04 LAB — MAGNESIUM
MAGNESIUM: 2 /dL (ref 1.7–2.41)
Magnesium: 2 mg/dL (ref 1.7–2.4)

## 2021-01-04 LAB — CBC WITH DIFFERENTIAL/PLATELET
Abs Immature Granulocytes: 0.03 10*3/uL (ref 0.00–0.07)
Basophils Absolute: 0 10*3/uL (ref 0.0–0.1)
Basophils Relative: 1 %
Eosinophils Absolute: 0.2 10*3/uL (ref 0.0–0.5)
Eosinophils Relative: 3 %
HCT: 33.6 % — ABNORMAL LOW (ref 39.0–52.0)
Hemoglobin: 10.2 g/dL — ABNORMAL LOW (ref 13.0–17.0)
Immature Granulocytes: 1 %
Lymphocytes Relative: 9 %
Lymphs Abs: 0.5 10*3/uL — ABNORMAL LOW (ref 0.7–4.0)
MCH: 27.9 pg (ref 26.0–34.0)
MCHC: 30.4 g/dL (ref 30.0–36.0)
MCV: 92.1 fL (ref 80.0–100.0)
Monocytes Absolute: 0.7 10*3/uL (ref 0.1–1.0)
Monocytes Relative: 11 %
Neutro Abs: 4.4 10*3/uL (ref 1.7–7.7)
Neutrophils Relative %: 75 %
Platelets: 182 10*3/uL (ref 150–400)
RBC: 3.65 MIL/uL — ABNORMAL LOW (ref 4.22–5.81)
RDW: 16.5 % — ABNORMAL HIGH (ref 11.5–15.5)
WBC: 5.8 10*3/uL (ref 4.0–10.5)
nRBC: 0 % (ref 0.0–0.2)

## 2021-01-04 LAB — BASIC METABOLIC PANEL
Anion gap: 5 (ref 5–15)
BUN: 47 mg/dL — ABNORMAL HIGH (ref 6–20)
CO2: 21 mmol/L — ABNORMAL LOW (ref 22–32)
Calcium: 9.3 mg/dL (ref 8.9–10.3)
Chloride: 112 mmol/L — ABNORMAL HIGH (ref 98–111)
Creatinine, Ser: 2.61 mg/dL — ABNORMAL HIGH (ref 0.61–1.24)
GFR, Estimated: 28 mL/min — ABNORMAL LOW (ref >60)
Glucose, Bld: 141 mg/dL — ABNORMAL HIGH (ref 70–99)
Potassium: 4.8 mmol/L (ref 3.5–5.1)
Sodium: 138 mmol/L (ref 135–145)

## 2021-01-04 LAB — MISC LABCORP TEST (SEND OUT): Labcorp test code: 550475

## 2021-01-05 DIAGNOSIS — Z940 Kidney transplant status: Principal | ICD-10-CM

## 2021-01-05 DIAGNOSIS — R799 Abnormal finding of blood chemistry, unspecified: Principal | ICD-10-CM

## 2021-01-05 MED ORDER — TACROLIMUS 1 MG CAPSULE, IMMEDIATE-RELEASE
ORAL_CAPSULE | Freq: Two times a day (BID) | ORAL | 11 refills | 30 days | Status: CP
Start: 2021-01-05 — End: 2022-01-05

## 2021-01-05 NOTE — Unmapped (Addendum)
[facility] Pharmacist has reviewed this new prescription.  Patient was counseled on this dosage change by coordinator AS- see epic note from 10/7.  Next refill call date adjusted if necessary.      Clinical Assessment Needed For: Dose Change  Medication: Tacrolimus 1mg  capsule  Last Fill Date/Day Supply: 01/03/2021 / 30 days  Copay $0  Was previous dose already scheduled to fill: No    Notes to Pharmacist: N/A

## 2021-01-05 NOTE — Unmapped (Signed)
Spoke with patient.  Per Dr Nicholas Strong, patient to decrease Prograft to 4 mg BID.   Patient verbalized understanding and will repeat labs on Monday.

## 2021-01-08 ENCOUNTER — Other Ambulatory Visit (HOSPITAL_COMMUNITY)
Admission: RE | Admit: 2021-01-08 | Discharge: 2021-01-08 | Disposition: A | Discharge status: Home / Self Care | Payer: Medicare Other | Source: Ambulatory Visit | Attending: Pediatric Nephrology | Admitting: Pediatric Nephrology

## 2021-01-08 ENCOUNTER — Encounter: Payer: Self-pay | Admitting: Orthopedic Surgery

## 2021-01-08 ENCOUNTER — Ambulatory Visit (INDEPENDENT_AMBULATORY_CARE_PROVIDER_SITE_OTHER): Payer: Medicare Other | Admitting: Orthopedic Surgery

## 2021-01-08 DIAGNOSIS — D899 Disorder involving the immune mechanism, unspecified: Secondary | ICD-10-CM | POA: Diagnosis not present

## 2021-01-08 DIAGNOSIS — T8789 Other complications of amputation stump: Secondary | ICD-10-CM

## 2021-01-08 DIAGNOSIS — Z940 Kidney transplant status: Secondary | ICD-10-CM | POA: Insufficient documentation

## 2021-01-08 DIAGNOSIS — Z9483 Pancreas transplant status: Secondary | ICD-10-CM | POA: Diagnosis not present

## 2021-01-08 DIAGNOSIS — E1129 Type 2 diabetes mellitus with other diabetic kidney complication: Secondary | ICD-10-CM | POA: Insufficient documentation

## 2021-01-08 DIAGNOSIS — E559 Vitamin D deficiency, unspecified: Secondary | ICD-10-CM | POA: Insufficient documentation

## 2021-01-08 DIAGNOSIS — Z89511 Acquired absence of right leg below knee: Secondary | ICD-10-CM

## 2021-01-08 DIAGNOSIS — Z09 Encounter for follow-up examination after completed treatment for conditions other than malignant neoplasm: Secondary | ICD-10-CM | POA: Diagnosis not present

## 2021-01-08 DIAGNOSIS — B259 Cytomegaloviral disease, unspecified: Secondary | ICD-10-CM | POA: Diagnosis not present

## 2021-01-08 DIAGNOSIS — Z789 Other specified health status: Secondary | ICD-10-CM | POA: Insufficient documentation

## 2021-01-08 DIAGNOSIS — D631 Anemia in chronic kidney disease: Secondary | ICD-10-CM | POA: Insufficient documentation

## 2021-01-08 DIAGNOSIS — Z79899 Other long term (current) drug therapy: Secondary | ICD-10-CM | POA: Insufficient documentation

## 2021-01-08 DIAGNOSIS — Z114 Encounter for screening for human immunodeficiency virus [HIV]: Secondary | ICD-10-CM | POA: Insufficient documentation

## 2021-01-08 DIAGNOSIS — L97919 Non-pressure chronic ulcer of unspecified part of right lower leg with unspecified severity: Secondary | ICD-10-CM | POA: Diagnosis not present

## 2021-01-08 DIAGNOSIS — N390 Urinary tract infection, site not specified: Secondary | ICD-10-CM | POA: Diagnosis not present

## 2021-01-08 LAB — CBC W/ DIFFERENTIAL
BASOPHILS ABSOLUTE COUNT: 0 (ref 0.0–0.11)
BASOPHILS RELATIVE PERCENT: 1 %
EOSINOPHILS ABSOLUTE COUNT: 0.2
EOSINOPHILS RELATIVE PERCENT: 2 %
HEMATOCRIT: 33.1 — ABNORMAL LOW (ref 39.0–52.0)
HEMOGLOBIN: 10 g/dL — ABNORMAL LOW (ref 13.0–17.0)
LYMPHOCYTES ABSOLUTE COUNT: 0.5 10*3/uL — ABNORMAL LOW (ref 0.7–4.0)
LYMPHOCYTES RELATIVE PERCENT: 8 %
MEAN CORPUSCULAR HEMOGLOBIN CONC: 30.2 g/dL (ref 30.0–36.01)
MEAN CORPUSCULAR HEMOGLOBIN: 27.9 pg (ref 26.0–34.01)
MEAN CORPUSCULAR VOLUME: 92.2 fL (ref 80.0–100.01)
MONOCYTES ABSOLUTE COUNT: 0.8 10*3/uL (ref 0.1–1.01)
MONOCYTES RELATIVE PERCENT: 12 %
NEUTROPHILS RELATIVE PERCENT: 76 %
NUCLEATED RED BLOOD CELLS: 0 (ref 0.0–0.21)
PLATELET COUNT: 198 10*3/uL (ref 150–4001)
RED BLOOD CELL COUNT: 3.59 MIL/uL — ABNORMAL LOW (ref 4.22–5.811)
RED CELL DISTRIBUTION WIDTH: 16.3 — ABNORMAL HIGH (ref 11.5–15.51)
WHITE BLOOD CELL COUNT: 6.4 10*3/uL (ref 4.0–10.51)

## 2021-01-08 LAB — TACROLIMUS LEVEL
TACROLIMUS BLOOD: 14.5 ng/mL (ref 2.0–20.0)
Tacrolimus (FK506) - LabCorp: 14.5 ng/mL (ref 2.0–20.0)

## 2021-01-08 LAB — BASIC METABOLIC PANEL
ANION GAP: 5 (ref 5–15)
Anion gap: 5 (ref 5–15)
BLOOD UREA NITROGEN: 45 mg/dL — ABNORMAL HIGH (ref 6–20)
BUN: 45 mg/dL — ABNORMAL HIGH (ref 6–20)
CALCIUM: 9.2 mg/dL (ref 8.9–10.3)
CHLORIDE: 108 mmol/L
CO2: 24 mmol/L (ref 22–32)
CO2: 24 mmol/L (ref 2–32)
CREATININE: 2.07 mg/dL — ABNORMAL HIGH (ref 0.61–1.24)
Calcium: 9.2 mg/dL (ref 8.9–10.3)
Chloride: 108 mmol/L (ref 98–111)
Creatinine, Ser: 2.07 mg/dL — ABNORMAL HIGH (ref 0.61–1.24)
EGFR CKD-EPI AA MALE: 37 mL/min — ABNORMAL LOW
GFR, Estimated: 37 mL/min — ABNORMAL LOW (ref >60)
GLUCOSE RANDOM: 140 mg/dL — ABNORMAL HIGH (ref 70–99)
Glucose, Bld: 140 mg/dL — ABNORMAL HIGH (ref 70–99)
MAGNESIUM: 1.7
PHOSPHORUS: 3.3 mg/dL (ref 2.5–4.6)
POTASSIUM: 4.9 mmol/L
Potassium: 4.9 mmol/L (ref 3.5–5.1)
SODIUM: 137 mmOl/L
Sodium: 137 mmol/L (ref 135–145)

## 2021-01-08 LAB — CBC WITH DIFFERENTIAL/PLATELET
Abs Immature Granulocytes: 0.07 10*3/uL (ref 0.00–0.07)
Basophils Absolute: 0 10*3/uL (ref 0.0–0.1)
Basophils Relative: 1 %
Eosinophils Absolute: 0.2 10*3/uL (ref 0.0–0.5)
Eosinophils Relative: 2 %
HCT: 33.1 % — ABNORMAL LOW (ref 39.0–52.0)
Hemoglobin: 10 g/dL — ABNORMAL LOW (ref 13.0–17.0)
Immature Granulocytes: 1 %
Lymphocytes Relative: 8 %
Lymphs Abs: 0.5 10*3/uL — ABNORMAL LOW (ref 0.7–4.0)
MCH: 27.9 pg (ref 26.0–34.0)
MCHC: 30.2 g/dL (ref 30.0–36.0)
MCV: 92.2 fL (ref 80.0–100.0)
Monocytes Absolute: 0.8 10*3/uL (ref 0.1–1.0)
Monocytes Relative: 12 %
Neutro Abs: 4.9 10*3/uL (ref 1.7–7.7)
Neutrophils Relative %: 76 %
Platelets: 198 10*3/uL (ref 150–400)
RBC: 3.59 MIL/uL — ABNORMAL LOW (ref 4.22–5.81)
RDW: 16.3 % — ABNORMAL HIGH (ref 11.5–15.5)
WBC: 6.4 10*3/uL (ref 4.0–10.5)
nRBC: 0 % (ref 0.0–0.2)

## 2021-01-08 LAB — PHOSPHORUS: Phosphorus: 3.3 mg/dL (ref 2.5–4.6)

## 2021-01-08 LAB — MAGNESIUM: Magnesium: 1.7 mg/dL (ref 1.7–2.4)

## 2021-01-08 NOTE — Progress Notes (Signed)
Office Visit Note   Patient: Tim Becker           Date of Birth: 10-20-1965           MRN: [id number] Visit Date: 01/08/2021              Requested by: Leeroy Cha, MD [address] [place],  [place] [address] PCP: Leeroy Cha, MD  Chief Complaint  Patient presents with   Right Leg - Wound Check    F/u on ulcer      HPI: Patient is a 55 year old gentleman presents in follow-up for ulcer right below-knee amputation.  Patient states he just went to [facility] has new foot ankle and liners.  Assessment & Plan: Visit Diagnoses:  1. History of right below knee amputation ([place])   2. Non-pressure ulcer of stump of below knee amputation of right lower extremity ([place])     Plan: The short stump shrinker was applied to the leg size large after reapplying his leg patient states that his symptoms did improve.  He will wear this at all times underneath the liner.  Follow-Up Instructions: Return in about 4 weeks (around 02/05/2021).   Ortho Exam  Patient is alert, oriented, no adenopathy, well-dressed, normal affect, normal respiratory effort. Examination patient has an ulcer over the residual limb right transtibial amputation.  After informed consent a 10 blade knife was used to debride the skin and soft tissue back to healthy viable tissue.  After debridement the ulcer is 3 cm in diameter 1 mm deep the granulation tissue was touched with silver nitrate.  There is no abscess no exposed bone or tendon no signs of infection.  Imaging: No results found. No images are attached to the encounter.  Labs: Lab Results  Component Value Date   HGBA1C 8.6 (H) 04/16/2016   ESRSEDRATE 74 (H) 04/15/2016   ESRSEDRATE 127 (H) 08/10/2014   CRP 13.3 (H) 04/15/2016   CRP 8.9 (H) 08/10/2014   REPTSTATUS 12/12/2020 FINAL 12/11/2020   GRAMSTAIN  04/16/2016    RARE WBC PRESENT, PREDOMINANTLY PMN RARE GRAM NEGATIVE RODS    CULT  12/11/2020    NO GROWTH Performed at  [facility], [place] [address] [place], [place] [address]    LABORGA METHICILLIN RESISTANT STAPHYLOCOCCUS AUREUS 04/16/2016     Lab Results  Component Value Date   ALBUMIN 3.2 (L) 04/17/2016   ALBUMIN 3.1 (L) 04/16/2016   ALBUMIN 3.0 (L) 04/15/2016    Lab Results  Component Value Date   MG 1.7 01/08/2021   MG 2.0 01/04/2021   MG 1.8 01/01/2021   No results found for: VD25OH  No results found for: PREALBUMIN CBC EXTENDED Latest Ref Rng & Units 01/08/2021 01/04/2021 01/01/2021  WBC 4.0 - 10.5 K/uL 6.4 5.8 6.0  RBC 4.22 - 5.81 MIL/uL 3.59(L) 3.65(L) 3.54(L)  HGB 13.0 - 17.0 g/dL 10.0(L) 10.2(L) 9.9(L)  HCT 39.0 - 52.0 % 33.1(L) 33.6(L) 32.5(L)  PLT 150 - 400 K/uL 198 182 190  NEUTROABS 1.7 - 7.7 K/uL 4.9 4.4 4.6  LYMPHSABS 0.7 - 4.0 K/uL 0.5(L) 0.5(L) 0.4(L)     There is no height or weight on file to calculate BMI.  Orders:  No orders of the defined types were placed in this encounter.  No orders of the defined types were placed in this encounter.    Procedures: No procedures performed  Clinical Data: No additional findings.  ROS:  All other systems negative, except as noted in the HPI.  Review of Systems  Objective: Vital Signs: There were no vitals taken for this visit.  Specialty Comments:  No specialty comments available.  PMFS History: Patient Active Problem List   Diagnosis Date Noted   Severe nonproliferative diabetic retinopathy of right eye ([place]) 09/12/2020   Severe nonproliferative diabetic retinopathy of left eye ([place]) 09/12/2020   Nuclear sclerotic cataract of both eyes 09/12/2020   Charcot's joint, left ankle and foot 10/29/2016   History of right below knee amputation ([place]) 10/29/2016   Diabetic polyneuropathy associated with type 2 diabetes mellitus ([place]) 10/29/2016   ESRD (end stage renal disease) on dialysis ([place]) 04/15/2016   Chronic osteomyelitis of toe of left foot ([place]) 04/15/2016   Left renal mass 07/11/2015   Biliary calculi  09/14/2014   Hemodialysis-associated hypotension 09/13/2014   H/O amputation of leg through tibia and fibula ([place]) 09/08/2014   Anemia associated with chronic renal failure 09/06/2014   Type 2 diabetes mellitus with diabetic neuropathy ([place]) 08/07/2014   Essential hypertension 08/07/2014   Obesity 08/07/2014   Hypothyroidism 08/07/2014   Kidney lump 04/06/2014   Obstructive apnea 04/06/2014   Diabetes mellitus ([place]) 03/23/2014   Patient awaiting renal transplant 03/23/2014   Asthma, moderate persistent 10/28/2013   Anemia due to blood loss 09/14/2013   Morbid obesity ([place]) 09/11/2013   Morbid (severe) obesity due to excess calories ([place]) 09/11/2013   Chronic kidney disease, stage IV (severe) ([place]) 09/10/2013   Type 2 diabetes mellitus ([place]) 09/10/2013   Mild persistent asthma with acute exacerbation 09/10/2013   Abnormal ECG 05/07/2013   CAD in native artery 05/07/2013   Past Medical History:  Diagnosis Date   Anemia    Arthritis    Cancer ([place])    Renal Tumor   CKD (chronic kidney disease)    Coronary artery disease    Diabetes ([place])    DM (diabetes mellitus) with complications ([place])    ESRD (end stage renal disease) ([place])    Pre- dialysis   Hypertension    Left kidney mass    Neuropathy    Obesity    Osteomyelitis, chronic, ankle or foot (HCC)    PONV (postoperative nausea and vomiting)    Renal insufficiency    Patient is on Dialysis and receives M,W and F.   S/P BKA (below knee amputation) [facility]) June 2016   Right , [place], [place]   Sleep apnea    CPAP   Thyroid disease     Family History  Problem Relation Age of Onset   Diabetes Mother    Kidney disease Mother    Breast cancer Mother     Past Surgical History:  Procedure Laterality Date   BASCILIC VEIN TRANSPOSITION Left 02/17/2015   Procedure: BASCILIC VEIN TRANSPOSITION;  Surgeon: Katha Cabal, MD;  Location: [facility] ORS;  Service: Vascular;  Laterality: Left;   BELOW KNEE LEG AMPUTATION Right     CORONARY ANGIOPLASTY WITH STENT PLACEMENT     EYE SURGERY     FOOT SURGERY     Multiple R foot surgery for infection and charcot foot   I & D EXTREMITY Left 04/16/2016   Procedure: IRRIGATION AND DEBRIDEMENT EXTREMITY/GREAT TOE AMP.;  Surgeon: Edrick Kins, DPM;  Location: [facility];  Service: Podiatry;  Laterality: Left;   JOINT REPLACEMENT     LAPAROSCOPIC NEPHRECTOMY, HAND ASSISTED Left 07/11/2015   Procedure: HAND ASSISTED LAPAROSCOPIC NEPHRECTOMY;  Surgeon: Hollice Espy, MD;  Location: [facility];  Service: Urology;  Laterality: Left;   PERIPHERAL  VASCULAR CATHETERIZATION Left 12/06/2014   Procedure: A/V Shuntogram/Fistulagram;  Surgeon: Katha Cabal, MD;  Location: [facility];  Service: Cardiovascular;  Laterality: Left;   PERIPHERAL VASCULAR CATHETERIZATION Left 12/06/2014   Procedure: A/V Shunt Intervention;  Surgeon: Katha Cabal, MD;  Location: [facility];  Service: Cardiovascular;  Laterality: Left;   TONSILLECTOMY     Social History   Occupational History   Not on file  Tobacco Use   Smoking status: Never   Smokeless tobacco: Never  Vaping Use   Vaping Use: Never used  Substance and Sexual Activity   Alcohol use: No   Drug use: No   Sexual activity: Yes

## 2021-01-09 DIAGNOSIS — Z940 Kidney transplant status: Principal | ICD-10-CM

## 2021-01-09 DIAGNOSIS — R82998 Other abnormal findings in urine: Principal | ICD-10-CM

## 2021-01-09 LAB — MISC LABCORP TEST (SEND OUT): Labcorp test code: 550870

## 2021-01-10 ENCOUNTER — Ambulatory Visit: Admit: 2021-01-10 | Discharge: 2021-01-11 | Payer: MEDICARE

## 2021-01-10 ENCOUNTER — Institutional Professional Consult (permissible substitution): Admit: 2021-01-10 | Discharge: 2021-01-11 | Payer: MEDICARE

## 2021-01-10 DIAGNOSIS — N2889 Other specified disorders of kidney and ureter: Principal | ICD-10-CM

## 2021-01-10 DIAGNOSIS — Z940 Kidney transplant status: Principal | ICD-10-CM

## 2021-01-10 DIAGNOSIS — R799 Abnormal finding of blood chemistry, unspecified: Principal | ICD-10-CM

## 2021-01-10 DIAGNOSIS — I151 Hypertension secondary to other renal disorders: Principal | ICD-10-CM

## 2021-01-10 DIAGNOSIS — E1122 Type 2 diabetes mellitus with diabetic chronic kidney disease: Secondary | ICD-10-CM | POA: Diagnosis not present

## 2021-01-10 DIAGNOSIS — Z4822 Encounter for aftercare following kidney transplant: Secondary | ICD-10-CM | POA: Diagnosis not present

## 2021-01-10 DIAGNOSIS — Z79899 Other long term (current) drug therapy: Secondary | ICD-10-CM | POA: Diagnosis not present

## 2021-01-10 LAB — COMPREHENSIVE METABOLIC PANEL
ALBUMIN: 3.7 g/dL (ref 3.4–5.0)
ALKALINE PHOSPHATASE: 111 U/L (ref 46–116)
ALT (SGPT): 62 U/L — ABNORMAL HIGH (ref 10–49)
ANION GAP: 9 mmol/L (ref 5–14)
AST (SGOT): 21 U/L (ref <=34)
BILIRUBIN TOTAL: 0.4 mg/dL (ref 0.3–1.2)
BLOOD UREA NITROGEN: 41 mg/dL — ABNORMAL HIGH (ref 9–23)
BUN / CREAT RATIO: 17
CALCIUM: 9.5 mg/dL (ref 8.7–10.4)
CHLORIDE: 106 mmol/L (ref 98–107)
CO2: 23.4 mmol/L (ref 20.0–31.0)
CREATININE: 2.4 mg/dL — ABNORMAL HIGH
EGFR CKD-EPI (2021) MALE: 31 mL/min/{1.73_m2} — ABNORMAL LOW (ref >=60)
GLUCOSE RANDOM: 157 mg/dL — ABNORMAL HIGH (ref 70–99)
POTASSIUM: 5 mmol/L — ABNORMAL HIGH (ref 3.4–4.8)
PROTEIN TOTAL: 6.6 g/dL (ref 5.7–8.2)
SODIUM: 138 mmol/L (ref 135–145)

## 2021-01-10 LAB — TACROLIMUS LEVEL
TACROLIMUS BLOOD: 8.4 ng/mL (ref 2.0–20.0)
Tacrolimus (FK506) - LabCorp: 8.4 ng/mL (ref 2.0–20.0)

## 2021-01-10 LAB — URINALYSIS
BILIRUBIN UA: NEGATIVE
BLOOD UA: NEGATIVE
GLUCOSE UA: NEGATIVE
HYALINE CASTS: 6 /LPF — ABNORMAL HIGH (ref 0–1)
KETONES UA: NEGATIVE
LEUKOCYTE ESTERASE UA: NEGATIVE
NITRITE UA: NEGATIVE
PH UA: 6 (ref 5.0–9.0)
PROTEIN UA: NEGATIVE
RBC UA: <1 /HPF (ref <3)
SPECIFIC GRAVITY UA: 1.025 (ref 1.005–1.030)
SQUAMOUS EPITHELIAL: <1 /HPF (ref 0–5)
UROBILINOGEN UA: 0.2
WBC UA: 22 /HPF — ABNORMAL HIGH (ref <2)

## 2021-01-10 LAB — CBC W/ AUTO DIFF
BASOPHILS ABSOLUTE COUNT: 0 10*9/L (ref 0.0–0.1)
BASOPHILS RELATIVE PERCENT: 0.6 %
EOSINOPHILS ABSOLUTE COUNT: 0.1 10*9/L (ref 0.0–0.5)
EOSINOPHILS RELATIVE PERCENT: 1.7 %
HEMATOCRIT: 31.7 % — ABNORMAL LOW (ref 39.0–48.0)
HEMOGLOBIN: 10.2 g/dL — ABNORMAL LOW (ref 12.9–16.5)
LYMPHOCYTES ABSOLUTE COUNT: 0.4 10*9/L — ABNORMAL LOW (ref 1.1–3.6)
LYMPHOCYTES RELATIVE PERCENT: 5.4 %
MEAN CORPUSCULAR HEMOGLOBIN CONC: 32.2 g/dL (ref 32.0–36.0)
MEAN CORPUSCULAR HEMOGLOBIN: 28.1 pg (ref 25.9–32.4)
MEAN CORPUSCULAR VOLUME: 87.1 fL (ref 77.6–95.7)
MEAN PLATELET VOLUME: 8.3 fL (ref 6.8–10.7)
MONOCYTES ABSOLUTE COUNT: 0.6 10*9/L (ref 0.3–0.8)
MONOCYTES RELATIVE PERCENT: 8.5 %
NEUTROPHILS ABSOLUTE COUNT: 6.1 10*9/L (ref 1.8–7.8)
NEUTROPHILS RELATIVE PERCENT: 83.8 %
NUCLEATED RED BLOOD CELLS: 0 /100{WBCs} (ref <=4)
PLATELET COUNT: 194 10*9/L (ref 150–450)
RED BLOOD CELL COUNT: 3.64 10*12/L — ABNORMAL LOW (ref 4.26–5.60)
RED CELL DISTRIBUTION WIDTH: 17.3 % — ABNORMAL HIGH (ref 12.2–15.2)
WBC ADJUSTED: 7.3 10*9/L (ref 3.6–11.2)

## 2021-01-10 LAB — IRON PANEL
IRON SATURATION: 59 % — ABNORMAL HIGH (ref 20–55)
IRON: 143 ug/dL
TOTAL IRON BINDING CAPACITY: 244 ug/dL — ABNORMAL LOW (ref 250–425)

## 2021-01-10 LAB — PROTEIN / CREATININE RATIO, URINE
CREATININE, URINE: 180.4 mg/dL
PROTEIN URINE: 29.9 mg/dL
PROTEIN/CREAT RATIO, URINE: 0.166

## 2021-01-10 LAB — HEPATITIS C RNA, QUANTITATIVE, PCR
HCV RNA LOG10: 5.493
HCV RNA LOG10: 5.57 {Log_IU}/mL — ABNORMAL HIGH (ref <0.00)
HCV RNA(IU): 311000
HCV RNA(IU): 369939 [IU]/mL — ABNORMAL HIGH (ref <=0)
HCV RNA: DETECTED
HCV RNA: DETECTED — AB

## 2021-01-10 LAB — MAGNESIUM: MAGNESIUM: 1.7 mg/dL (ref 1.6–2.6)

## 2021-01-10 LAB — PHOSPHORUS: PHOSPHORUS: 3.2 mg/dL (ref 2.4–5.1)

## 2021-01-10 LAB — FERRITIN: FERRITIN: 1982.2 ng/mL — ABNORMAL HIGH

## 2021-01-10 LAB — MISC LABCORP TEST (SEND OUT): Labcorp test code: 550475

## 2021-01-10 MED ORDER — CARVEDILOL 12.5 MG TABLET
ORAL_TABLET | Freq: Two times a day (BID) | ORAL | 11 refills | 30.00000 days | Status: CP
Start: 2021-01-10 — End: 2022-01-10
  Filled 2021-01-10: qty 60, 30d supply, fill #0

## 2021-01-10 MED ORDER — SEMAGLUTIDE 1 MG/DOSE (4 MG/3 ML) SUBCUTANEOUS PEN INJECTOR
SUBCUTANEOUS | 3 refills | 84.00000 days | Status: CP
Start: 2021-01-10 — End: 2022-01-10
  Filled 2021-01-17: qty 9, 84d supply, fill #0

## 2021-01-10 MED ORDER — TACROLIMUS 1 MG CAPSULE, IMMEDIATE-RELEASE
ORAL_CAPSULE | ORAL | 11 refills | 30 days | Status: CP
Start: 2021-01-10 — End: 2022-01-10

## 2021-01-10 MED ORDER — CETIRIZINE 10 MG TABLET
ORAL_TABLET | Freq: Every day | ORAL | 11 refills | 30.00000 days | Status: CP
Start: 2021-01-10 — End: 2022-01-10
  Filled 2021-01-17: qty 30, 30d supply, fill #0

## 2021-01-10 MED ORDER — TACROLIMUS XR 1 MG TABLET,EXTENDED RELEASE 24 HR
ORAL_TABLET | Freq: Every day | ORAL | 11 refills | 25.00000 days | Status: CP
Start: 2021-01-10 — End: ?

## 2021-01-10 NOTE — Unmapped (Addendum)
[facility]   TRANSPLANT FOLLOW UP     PCP: Megan Salon, MD   Kidney transplant coordinator: Laury Deep     Date of Visit at Transplant clinic: 01/10/2021     Assessment/Recommendations:   # s/p donor kidney transplant 12/04/20  Native kidney disease is multifactorial: DM2, HTN, and s/p left nephrectomy for RCC  Graft function: Cr at 2.4, this is slightly up, suspect 2/2 tac levels  DSAs: Not checked yet   Post-surgical issues:  - aspirin for 1 year post-op   - ureteral stent removed 01/02/21     # Immunosuppression  Tacrolimus (Prograf) to 4 mg AM and 3 mg PM, goal trough 8-10 ng/mL   - he did not drop the dose and has been actually taking 5 mg BID   - trial of Envarsus switch due to high copay for Prograf  Mycophenolate (Myfortic) 540 mg BID    # Acute issues today  none    # BP management   Goal 130/80, as tolerated.   - reduce carvedilol to 12.5 mg BID due to orthostatic symptoms  - chlorthalidone 25 mg daily     # Infectious disease  EBV D +/R+, CMV D-/R-, HCV donor Ab +, NAT +  CMV ppx: none (HSV ppx with acyclovir x3 mo)  PJP ppx: Bactrim x6 mo  HCV viremia: has appt with hepatology to initiate DAA    # Anemia screening  Hg 10.1, improved from 9.8    # Cardiovascular:  CAD s/p PCI   The ASCVD Risk Score Denman George DC Montez Hageman, et al., 2013) failed to return a valid value for an unknown reason.   - aspirin 81 mg  - carvedilol  - rosuvastatin 10 mg daily    # Electrolytes  Mg 1.7  K 5.0    # DM  - Basaglar 50 units nightly  - Lispro meal and correction dose  - semaglutide (Ozempic) started 12/21/20, INCREASE dose today to 1 mg weekly  - referred to Endocrinology, hope that CGM will be an option for him, his experience with prior hypoglycemia is a limiting factor for diabetes control     # Comorbidities  RCC s/p left nephrectomy in 2017  OSA on CPAP  Allergies- Zyrtec   Right BKA (2016, chronic osteomyelitis)       # Immunizations  Immunization History   Administered Date(s) Administered   ??? COVID-19 VAC,MRNA,TRIS(12Y UP)(PFIZER)(GRAY CAP) 08/31/2020   ??? COVID-19 VACC,MRNA,(PFIZER)(PF)(IM) 06/11/2019, 07/07/2019, 12/31/2019, 08/31/2020   ??? Hepatitis B, Adult 12/30/2014, 01/27/2015, 03/01/2015, 06/30/2015   ??? INFLUENZA TIV (TRI) 30MO+ W/ PRESERV (IM) 07/31/2011, 01/31/2020   ??? Influenza Vaccine Quad (IIV4 PF) 54mo+ injectable 12/27/2020   ??? Influenza Virus Vaccine, unspecified formulation 01/19/2020   ??? PNEUMOCOCCAL POLYSACCHARIDE 23 05/22/2015   ??? TdaP 03/01/2017       # Cancer screening  Colonoscopy: Recommended  Skin: recommend yearly dermatology evaluation    # Follow up:  Labs: 2x/week  Visits: RTC 2 weeks      Kidney Transplant History:   Date of Transplant: 12/04/2020 (Kidney)  Type of Transplant: DDKT, DBD  KDPI: 70%  Cold ischemic time: 19 hr 19 min  Warm ischemic time: 188 minutes  cPRA: 0%   HLA match: 5/6 mismatch   Blood type: Donor O, Recipient O POS  ID: CMV D - /R -, EBV D+/R+, HCV donor Ab +, NAT +  Zero-Hour Biopsy: Arteriosclerosis, 10-15% IFTA and mesangial staining of C1q, mild ATI  Native Kidney  Disease: DM2, HTN, RCC s/p left nephrectomy   Native kidney biopsy: None    Pre-transplant dialysis course: HD since 09/08/2014  Post-Transplant Course:    Delayed graft function requiring dialysis: None   Other complications: None   Prior Transplants: None  Induction: Thymoglobulin   Early steroid withdrawal: Yes  Rejection Episodes: None     History of Presenting Illness:     - high copays are a problem  - diarrhea overnight last night, low fluid intake since that happened, attributes to both extra docusate and fatty foods yesterday  - home BP 140s-150s but intermittently lightheaded with SBP 88  - drinking 4 bottles water daily (2L)  - blood sugars 150s-160s in AM    Concerns about nonadherence: None     Social: Never smoked. Lives with his wife in [place].     Review of Systems:   A 12-system review was negative except as documented in the HPI.    Physical Exam:     BP 116/61 (BP Site: R Arm, BP Position: Sitting, BP Cuff Size: Large)  - Pulse 74  - Temp 36.4 ??C (97.5 ??F) (Temporal)  - Ht 170.2 cm (5' 7.01)  - Wt (!) 106.3 kg (234 lb 6.4 oz)  - BMI 36.70 kg/m??    Constitutional:  Well-appearing in NAD  Eyes:  anicteric sclerae  ENT:  MMM  CV:  RRR, no m/r/g, no JVD, extremities WWP with no edema  Resp:  Good air movement, CTAB  GI:  Abdomen soft, NTND, +bs  MSK:  Grossly normal, exam is limited  Skin:  Normal turgor, no rash  Neuro:  Grossly normal, exam is limited  Psych:  Normal affect  Lymph:  No cervical adenopathy      Allergies:   Allergies   Allergen Reactions   ??? Ferric Citrate Nausea And Vomiting        Current Medications:   Current Outpatient Medications   Medication Sig Dispense Refill   ??? acetaminophen (TYLENOL) 500 MG tablet Take 1-2 tablets (500-1,000 mg total) by mouth every six (6) hours as needed for pain or fever. 60 tablet 11   ??? acyclovir (ZOVIRAX) 400 MG tablet Take 1 tablet (400 mg total) by mouth Two (2) times a day. 60 tablet 2   ??? aspirin (ECOTRIN) 81 MG tablet Take 1 tablet (81 mg total) by mouth daily. 30 tablet 11   ??? carvediloL (COREG) 25 MG tablet Take 1 tablet (25 mg total) by mouth Two (2) times a day. 60 tablet 11   ??? chlorthalidone (HYGROTON) 25 MG tablet Take 1 tablet (25 mg total) by mouth every morning. 30 tablet 11   ??? cyanocobalamin (VITAMIN B-12) 100 MCG tablet Take 1 tablet (100 mcg total) by mouth daily. 100 tablet 11   ??? diphenhydrAMINE (BENADRYL) 25 mg capsule Take 1 capsule (25 mg total) by mouth nightly as needed for itching. 48 capsule 0   ??? docusate sodium (COLACE) 100 MG capsule Take 1 capsule (100 mg total) by mouth two (2) times a day as needed for constipation. 60 capsule 11   ??? famotidine (PEPCID) 20 MG tablet Take 1 tablet (20 mg total) by mouth daily as needed for heartburn. 30 tablet 11   ??? insulin glargine (BASAGLAR, LANTUS) 100 unit/mL (3 mL) injection pen Inject 0.5 mL (50 Units total) under the skin nightly. 15 mL 9   ??? insulin lispro (HUMALOG) 100 unit/mL injection pen Inject 3 units with breakfast and lunch. Plus sliding scale insulin  TID with meals (2 units for every 50 > 150). Max daily dose: 42 units 15 mL 11   ??? levothyroxine (SYNTHROID) 75 MCG tablet Take 1 tablet (75 mcg total) by mouth daily. 30 tablet 11   ??? magnesium oxide-Mg AA chelate (MAGNESIUM, AMINO ACID CHELATE,) 133 mg Take 1 tablet by mouth Two (2) times a day. 100 tablet 11   ??? melatonin 5 mg tablet Take by mouth nightly as needed. 60 tablet 5   ??? mycophenolate (MYFORTIC) 180 MG EC tablet Take 3 tablets (540 mg total) by mouth Two (2) times a day. Adjust dose per medication card. 180 tablet 11   ??? nitroglycerin (NITROSTAT) 0.4 MG SL tablet Place 1 tablet (0.4 mg total) under the tongue continuous as needed for chest pain. Maximum of 3 doses in 15 minutes. 25 tablet 1   ??? polyethylene glycol (GLYCOLAX) 17 gram/dose powder Mix 1 capful (17 grams) in 4-8 ounces of water,juice or tea and drink daily as needed. 510 g 11   ??? rosuvastatin (CRESTOR) 10 MG tablet Take 1 tablet (10 mg total) by mouth daily. 30 tablet 11   ??? semaglutide (OZEMPIC) 0.25 mg or 0.5 mg(2 mg/1.5 mL) PnIj injection Inject 0.5 mg under the skin every seven (7) days. 1.5 mL 11   ??? sulfamethoxazole-trimethoprim (BACTRIM) 400-80 mg per tablet Take 1 tablet (80 mg of trimethoprim total) by mouth 3 (three) times a week. 12 tablet 5   ??? tacrolimus (PROGRAF) 1 MG capsule Take 4 capsules (4 mg total) by mouth two (2) times a day. 240 capsule 11     No current facility-administered medications for this visit.       Past Medical History:   Past Medical History:   Diagnosis Date   ??? Cancer (CMS-HCC)    ??? Chronic kidney disease    ??? Diabetes mellitus (CMS-HCC)         Laboratory studies:   Reviewed recent results.        Electronically signed by:   Leafy Half, MD  [facility]

## 2021-01-10 NOTE — Unmapped (Signed)
Transplant Coordinator, Clinic Visit   Pt seen today by transplant nephrology for follow up, reviewed medications and symptoms.          01/10/21 1010   BP: 116/61   Pulse: 74   Temp: 36.4 ??C (97.5 ??F)   Weight: (!) 106.3 kg (234 lb 6.4 oz)   Height: 170.2 cm (5' 7.01)   PainSc: 0-No pain     Patient presents to clinic with his wife.  Coordinator and phamacy spoke with patient regarding his Prograf cost.  Envarsus process started for a better cost alternative to Prograf.  Patient also provided paperwork for Pharmacy Assistance Program with instructions on how to fill out and turn in.  Patient states he would like prescription for Zyrtec due to runny nose/allergy symptoms he has been having for awhile.  Coordinator removed staples along incision line.  Patient tolerated well.  Incision noted to be clean, dry and intact and well approximated.    Assessment  BP: not currently checking at home  Lightheaded: denies  Headache: denies  Hand tremors: denies  Numbness/tingling: denies  Fevers: denies  Chills/sweats: denies  Shortness of breath: denies  Chest pain or pressure: denies  Palpitations: denies  Abdominal pain: none  Heart burn: none  Nausea/vomiting: denies  Diarrhea/constipation: denies  UTI symptoms: none  Swelling: improved, minimal, occasionl  Sleep: difficulty, plans to restart Melatonin  Pain: none  Incision: staples intact, removed today      Good appetite; reports adequate hydration.     Intake: reports adequate  Output: reports adequate    Any new medications? none  Immunosuppressant last taken: 2100 10/11, labs today    Functional Score: 80    Normal activity with efforts; some signs or symptoms of disease.       I spent a total of 25 minutes with Nicholas Strong reviewing medications and symptoms.

## 2021-01-10 NOTE — Unmapped (Signed)
AOBP r arm large cuff  Average 116/61 (74)  1st 102/60 (80)  2nd 122/64 (71)  3rd 123/59 (70)

## 2021-01-10 NOTE — Unmapped (Signed)
[facility] TRANSPLANT [facility]INIC PHARMACY NOTE  01/10/2021   Nicholas Strong  [id number]    Medication changes today:   1. Decrease carvedilol to 12.5 mg BID  2. Start cetirizine 10 mg daily PRN  3. Rx sent for Envarsus to investigate coverage/mfg assistance  4. Increase semaglutide to 1 mg weekly  5. Decrease tacrolimus to 4 mg AM and 3 mg PM    Education/Adherence tools provided today:  - Provided updated medication list  - Provided additional education on immunosuppression and transplant related medications including reviewing indications of medications, dosing and side effects  - Provided additional education on diabetes management  - Provided additional pill box education    Follow up items:  1. Goal of understanding indications and dosing of immunosuppression medications  2. BG  3. BP/orthostasis  4. HCV RNA/Genotype  5. Mfg assistance/PAP application    Next visit with pharmacy in 1 month  ____________________________________________________________________    Nicholas Strong is a 55 y.o. male s/p deceased kidney transplant on December 24, 2020 (Kidney) 2/2 T2DM polyneuropathy, renal stone, and RCC (s/p left nephrectomy).     Immunologic Risk: cPRA 0,, HLA MM 5/6, and first transplant    Induction Agent : thymoglobulin    Donor Factors: KDPI 70%, DBD and HCV NAT+    Other PMH significant for Hypertension, CAD and Hypothyroidism  Post op course complicated by [place] on POD0 showed elevated velocity in the main renal artery at the level of the anastomosis, likely secondary to post-op edema    Rejection History: NTD  Infection History: NTD  ___________________________________________________________________    Last seen by pharmacy 2 weeks ago    Interval History: NTD    Seen by pharmacy today for: medication management, blood glucose management and education and pill box fill and adherence education    CC:  Patient complains of diarrhea last night. Pt also reports concerns with medication cost.    General: No issues   Neuro: Tremors (mild)  CV: No issues  Resp: No issues  GI: Diarrhea and Nausea (worse last night - possibly food related)  GU: No issues  Derm: No issues  Psych: No issues.    Fluid Status:   Edema no, SOB no  Intake: ~60 oz daily (struggling to increase intake after previous restriction with dialysis)  Output: not recording, urinating regularly   Plan: Fluid goal of at least 80 oz. Continue to monitor.      Vitals:    01/10/21 1012   BP: 116/61   Pulse: 71   Temp: 36.4 ??C (97.5 ??F)     ___________________________________________________________________    Allergies   Allergen Reactions   ??? Ferric Citrate Nausea And Vomiting       Medications reviewed in EPIC medication station and updated today by the clinical pharmacist practitioner.    Current Outpatient Medications   Medication Instructions   ??? acetaminophen (TYLENOL) 500-1,000 mg, Oral, Every 6 hours PRN   ??? acyclovir (ZOVIRAX) 400 mg, Oral, 2 times a day (standard)   ??? aspirin (ECOTRIN) 81 mg, Oral, Daily (standard)   ??? carvediloL (COREG) 12.5 mg, Oral, 2 times a day (standard)   ??? chlorthalidone (HYGROTON) 25 mg, Oral, Every morning   ??? cyanocobalamin (VITAMIN B-12) 100 mcg, Oral, Daily (standard)   ??? diphenhydrAMINE (BENADRYL) 25 mg, Oral, Nightly PRN   ??? docusate sodium (COLACE) 100 mg, Oral, 2 times a day PRN   ??? famotidine (PEPCID) 20 mg, Oral, Daily PRN   ??? insulin glargine (BASAGLAR, LANTUS) 50  Units, Subcutaneous, Nightly   ??? insulin lispro (HUMALOG) 100 unit/mL injection pen Inject 3 units with breakfast and lunch. Plus sliding scale insulin TID with meals (2 units for every 50 > 150). Max daily dose: 42 units   ??? levothyroxine (SYNTHROID) 75 mcg, Oral, Daily (standard)   ??? magnesium oxide-Mg AA chelate (MAGNESIUM, AMINO ACID CHELATE,) 133 mg 1 tablet, Oral, 2 times a day (standard)   ??? melatonin 5 mg tablet Oral, Nightly PRN   ??? mycophenolate (MYFORTIC) 540 mg, Oral, 2 times a day (standard), Adjust dose per medication card.   ??? nitroglycerin (NITROSTAT) 0.4 mg, Sublingual, Continuous PRN, Maximum of 3 doses in 15 minutes.    ??? OZEMPIC 0.5 mg, Subcutaneous, Every 7 days   ??? polyethylene glycol (GLYCOLAX) 17 gram/dose powder Mix 1 capful (17 grams) in 4-8 ounces of water,juice or tea and drink daily as needed.   ??? rosuvastatin (CRESTOR) 10 mg, Oral, Daily (standard)   ??? sulfamethoxazole-trimethoprim (BACTRIM) 400-80 mg per tablet 80 mg of trimethoprim, Oral, 3 times weekly   ??? tacrolimus (ENVARSUS XR) 7 mg, Oral, Daily (standard)   ??? tacrolimus (PROGRAF) 1 MG capsule Take 4 capsules (4 mg total) by mouth daily AND 3 capsules (3 mg total) nightly.         GRAFT FUNCTION: improving, bump today likely 2/2 dehydration with diarrhea and elevated tac level  Lab Results   Component Value Date    Creatinine 2.40 (H) 01/10/2021    Creatinine 2.07 (H) 01/08/2021    Creatinine 2.61 (H) 01/04/2021    Creatinine 2.31 (H) 01/01/2021       Baseline Scr: TBD  Scr nadir: 2.07  Estimated Creatinine Clearance: 40.4 mL/min (A) (based on SCr of 2.4 mg/dL (H)).    Proteinuria/UPC: No/ none.  Lab Results   Component Value Date    Protein/Creatinine Ratio, Urine 0.166 01/10/2021    Protein/Creatinine Ratio, Urine 0.236 12/27/2020    Protein/Creatinine Ratio, Urine 0.53 (H) 12/11/2020    Protein/Creatinine Ratio, Urine 0.744 12/08/2020       DSA: ntd  No results found for: DSAPT, DSACM, DSA1C    Zero hour biopsy:       Biopsies to date: NTD      CURRENT IMMUNOSUPPRESSION:    Tacrolimus (Prograf) 5 mg BID  Tacrolimus Goal: 8 - 10   Mycophenolate sodium (Myfortic) 540 mg BID      IMMUNOSUPPRESSION DRUG LEVELS:  Lab Results   Component Value Date    Tacrolimus, Trough 8.8 12/27/2020    Tacrolimus, Trough 8.0 12/09/2020    Tacrolimus, Trough 6.0 12/08/2020    Tacrolimus, Timed 14.5 01/04/2021    Tacrolimus, Timed 13.6 01/01/2021    Tacrolimus, Timed 11.7 12/25/2020     Prograf level is accurate 12 hour trough, took tac at 9pm last night     Patient is tolerating immunosuppression well    WBC/ANC:  wnl  Lab Results   Component Value Date    WBC 7.3 01/10/2021    WBC 6.4 01/08/2021       Plan: Will decrease tacrolimus to 4 mg AM and 3 mg PM. Continue to monitor.      OI Prophylaxis:   CMV Status: D- /R-, low risk.   CMV prophylaxis: acyclovir 400 mg bid x 3 months per protocol.  No results found for: CMVCP    PCP Prophylaxis: bactrim SS 1 tab MWF x 6 months.    Thrush: completed in hospital    Patient is  tolerating infectious  prophylaxis well    Plan: Continue per protocol. Continue to monitor.    HCV Donor+ Organ  HCV RNA status: >150,000 today  Plan: Genotype will be assessed and hepatology will be engaged for HCV treatment initiation.    CV Prophylaxis: asa 81 mg   The ASCVD Risk Score Denman George DC Montez Hageman, et al., 2013) failed to return a valid value for an unknown reason.  No results found for: LIPID  Statin therapy: Indicated; currently on rosuvastatin 10 mg (switched from prior atorvastatin 40 mg d/t potential use of Mavyret outpatient for HCV based on donor HCV NAT+ status)  Plan: no changes. Continue to monitor       BP: Goal < 140/90. Clinic vitals reported above  Home BP ranges: 115 - 150/60 - 70. Does report hypotension (lowest SBP 88)  Current meds include: carvedilol 25 mg BID, chlorthalidone 25 mg   Plan: mostly within goal. Decrease carvedilol to 12.5 mg BID. Continue to monitor    Anemia of CKD:  H/H:   Lab Results   Component Value Date    HGB 10.2 (L) 01/10/2021     Lab Results   Component Value Date    HCT 31.7 (L) 01/10/2021     Iron panel:  Lab Results   Component Value Date    IRON 143 01/10/2021    TIBC 244 (L) 01/10/2021    FERRITIN 1,982.2 (H) 01/10/2021     Lab Results   Component Value Date    Iron Saturation (%) 59 (H) 01/10/2021       Prior ESA use: none  Plan: stable. No change.  Continue to monitor.     DM:   Lab Results   Component Value Date    A1C 5.3 12/03/2020   . Goal A1c < 7  History of Dm? Yes: T2DM since 1997  Established with endocrinologist/PCP for BG managment? No  Currently on:   Lantus 50U daily (taking in the morning)  Humalog 3U with breakfast and lunch + SSI  Ozempic 0.5 mg weekly (restarted 9/16)  Home BS log:  - Fasting AM 150 - 150 (outlier: 200)  - Pre-lunch: 138 - 162  - Pre-dinner: 110 - 150 (outlier: 201)    Diet: pt eats 2 meals per day (assessing insulin either at lunch or dinner time depending on when he eats)  Exercise: not assessed  Hypoglycemia: no  Plan: Increase Ozempic to 1 mg weekly. Continue to monitor.      Electrolytes: wnl, K+ has been upper limit  Lab Results   Component Value Date    Potassium 5.0 (H) 01/10/2021    Sodium 138 01/10/2021    Magnesium 1.7 01/10/2021    CO2 23.4 01/10/2021    Phosphorus 3.2 01/10/2021     Meds currently on: none  Plan: No change.  Continue to monitor     GI/BM: pt reports normal BMs outside of a couple isolated incidences of diarrhea  Meds currently on: docusate PRN (daily), Miralax PRN (not using), famotidine 20 mg PRN (not needed)  Plan: No change.  Remove miralax and faoitidine from medlist. Continue to monitor    Pain: pt reports no pain   Meds currently on: APAP PRN (using occasionally for pain at amputation site)  Plan: No change. Continue to monitor    Bone health:   Vitamin D Level: none available. Goal > 30.   No results found for: VITDTOT, VITDTOTAL, VITDTOTAL, VITDTOTAL, VITDTOTAL    Lab Results   Component Value Date  Calcium 9.5 01/10/2021    Calcium 9.2 01/08/2021     Last DEXA results:  none available  Current meds include: none   Plan: Vitamin D level  needs to be drawn with next lab schedule 3 months after transplant,assess supplementation needs. Continue to monitor.     Women's/Men's Health:  Nicholas Strong is a 55 y.o. male. Patient reports no men's/women's health issues  Plan: Continue to monitor    Hypothyroidism:  Current meds: levothyroxine 75 mcg  Plan: no changes. Continue to monitor and follow-up with TSH.    Sleep:  Patient is sleeping okay since discharge (waking up at night to pee often), not taking anything for sleep  Current meds: benadryl (not taking), melatonin 5 mg PRN (not taking)  Plan: no changes. Will try melatonin. Continue to monitor    Immunizations:  Influenza [Annual]: Received 2021, 12/27/20    PCV13: Never received  PPSV23: Received 05/2015  PCV20: Never received    Shingrix Zoster [2 doses, 2 - 6 months apart]: Never received    COVID-19 [3 primary doses, 2 boosters]: 1st dose given 06/11/19, 2nd dose given 07/07/19, 3rd dose given 12/31/19 and Booster given 08/31/20    Pharmacy preference:  [facility]   Medication Refills:  n/a  Medication Access:  n/a    Adherence: Patient has average understanding of medications; was not able to independently identify names/doses of immunosuppressants and OI meds.  Patient  does fill their own pill box on a regular basis at home.  Patient brought medication card:yes  Pill box:did not bring  Plan: become more familiar with medications; provided moderate adherence counseling/intervention    Patient was reviewed with Dr. Elvera Maria who was agreement with the stated plan:     During this visit, the following was completed:   BG log data assessment  BP log data assessment  Labs ordered and evaluated  complex treatment plan >1 DS   I spent a total of 30 minutes face to face with the patient delivering clinical care and providing education/counseling.    All questions/concerns were addressed to the patient's satisfaction.  __________________________________________  Olivia Mackie, CPP, PharmD   Abdominal Transplant Clinical Pharmacist Practitioner

## 2021-01-10 NOTE — Unmapped (Addendum)
Today:  - increase Ozempic  - reduce tacrolimus to 4 mg AM, 3 mg PM  - reduce carvedilol to 12.5 mg twice daily  - for food cravings: stock up on healthy snacks that have some protein (string cheese, yogurt, almonds, cashews etc)  - wait 1 more week before immersing your wound in bath or pool water    Endocrinology referral for continuous glucose monitor:  - for now the plan is for you to seek a referral via your PCP  - if you decide you want a [facility] Endocrinology referral please let us know

## 2021-01-10 NOTE — Unmapped (Addendum)
[facility] Pharmacist has reviewed this new prescription.  Patient was counseled on this dosage change by cpp [facility]- see epic note from 10/12.  Next refill call date adjusted if necessary.        Clinical Assessment Needed For: Dose Change  Medication: Tacrolimus 1 mg capsules  Last Fill Date/Day Supply: 01/03/21 / 30  Copay $0  Was previous dose already scheduled to fill: No    Notes to Pharmacist: n/a

## 2021-01-11 DIAGNOSIS — B182 Chronic viral hepatitis C: Principal | ICD-10-CM

## 2021-01-11 LAB — CMV DNA, QUANTITATIVE, PCR: CMV VIRAL LD: NOT DETECTED

## 2021-01-11 LAB — TACROLIMUS LEVEL, TROUGH: TACROLIMUS, TROUGH: 9.4 ng/mL (ref 5.0–15.0)

## 2021-01-11 LAB — HEPATITIS C GENOTYPE: HCV GENOTYPE: 3

## 2021-01-11 LAB — HIV RNA, QUANTITATIVE, PCR: HIV RNA QNT RSLT: NOT DETECTED

## 2021-01-11 MED ORDER — GLECAPREVIR 100 MG-PIBRENTASVIR 40 MG TABLET
Freq: Every day | ORAL | 1 refills | 28 days | Status: CP
Start: 2021-01-11 — End: ?
  Filled 2021-01-17: qty 1, 28d supply, fill #0

## 2021-01-11 NOTE — Unmapped (Signed)
Counseling for HCV treatment     Nicholas Strong is 55 y.o. who is s/p HCV NAT+ kidney transplant on 12/04/20. Spoke with patient. Reviewed rationale for HCV treatment and starting HCV treatment with Mavyret. We discussed the prior authorization (PA) process of obtaining the medication through insurance and that this may take some time.  Pt was concerned with having acquired HCV via since transplantation would cause harm to his body and wishes to start on treatment ASAP. Reviewed slow progressing nature of HCV and having HCV couple of weeks would not significantly affect his outcome.     B18.2 Hep C: yes    K74.60 Cirrhosis: no   Child Pugh Score if applicable and for Medicaid pts: n/a  Z94.4 Liver Transplant: no  Z94.0 Kidney Transplant: YES on 12/04/20    Genotype: 3 (01/08/21)                           HCV RNA: 396,939IU/ml   Date: 01/10/21   Fibrosis score: FibroSure 0.03 (F0) 12/06/20  HIV Co-infection? Negative 12/03/20  Signs of liver decompensation? no  Previous treatment? naive    Planned regimen: Mavyret (glecaprevir/pibrentasvir 100/40 mg) 3 tabs daily x 8 weeks  Urgency: Routine Request    Prescribing Provider/NPI: Dr. Chip Boer / [id number]  Signature waiver form not obtained at this time.  Insurance: [employer]    Current medications:  Current Outpatient Medications   Medication Sig Dispense Refill   ??? acetaminophen (TYLENOL) 500 MG tablet Take 1-2 tablets (500-1,000 mg total) by mouth every six (6) hours as needed for pain or fever. 60 tablet 11   ??? acyclovir (ZOVIRAX) 400 MG tablet Take 1 tablet (400 mg total) by mouth Two (2) times a day. 60 tablet 2   ??? aspirin (ECOTRIN) 81 MG tablet Take 1 tablet (81 mg total) by mouth daily. 30 tablet 11   ??? carvediloL (COREG) 12.5 MG tablet Take 1 tablet (12.5 mg total) by mouth Two (2) times a day. 60 tablet 11   ??? cetirizine (ZYRTEC) 10 MG tablet Take 1 tablet (10 mg total) by mouth daily. 30 tablet 11   ??? chlorthalidone (HYGROTON) 25 MG tablet Take 1 tablet (25 mg total) by mouth every morning. 30 tablet 11   ??? cyanocobalamin (VITAMIN B-12) 100 MCG tablet Take 1 tablet (100 mcg total) by mouth daily. 100 tablet 11   ??? diphenhydrAMINE (BENADRYL) 25 mg capsule Take 1 capsule (25 mg total) by mouth nightly as needed for itching. 48 capsule 0   ??? docusate sodium (COLACE) 100 MG capsule Take 1 capsule (100 mg total) by mouth two (2) times a day as needed for constipation. 60 capsule 11   ??? insulin glargine (BASAGLAR, LANTUS) 100 unit/mL (3 mL) injection pen Inject 0.5 mL (50 Units total) under the skin nightly. 15 mL 9   ??? insulin lispro (HUMALOG) 100 unit/mL injection pen Inject 3 units with breakfast and lunch. Plus sliding scale insulin TID with meals (2 units for every 50 > 150). Max daily dose: 42 units 15 mL 11   ??? levothyroxine (SYNTHROID) 75 MCG tablet Take 1 tablet (75 mcg total) by mouth daily. 30 tablet 11   ??? magnesium oxide-Mg AA chelate (MAGNESIUM, AMINO ACID CHELATE,) 133 mg Take 1 tablet by mouth Two (2) times a day. 100 tablet 11   ??? melatonin 5 mg tablet Take by mouth nightly as needed. 60 tablet 5   ??? mycophenolate (  MYFORTIC) 180 MG EC tablet Take 3 tablets (540 mg total) by mouth Two (2) times a day. Adjust dose per medication card. 180 tablet 11   ??? nitroglycerin (NITROSTAT) 0.4 MG SL tablet Place 1 tablet (0.4 mg total) under the tongue continuous as needed for chest pain. Maximum of 3 doses in 15 minutes. 25 tablet 1   ??? rosuvastatin (CRESTOR) 10 MG tablet Take 1 tablet (10 mg total) by mouth daily. 30 tablet 11   ??? semaglutide (OZEMPIC) 1 mg/dose (4 mg/3 mL) PnIj injection Inject 1 mg under the skin every seven (7) days. 9 mL 3   ??? sulfamethoxazole-trimethoprim (BACTRIM) 400-80 mg per tablet Take 1 tablet (80 mg of trimethoprim total) by mouth 3 (three) times a week. 12 tablet 5   ??? tacrolimus (PROGRAF) 1 MG capsule Take 4 capsules (4 mg total) by mouth daily AND 3 capsules (3 mg total) nightly. 210 capsule 11   ??? tacrolimus (ENVARSUS XR) 1 mg Tb24 extended release tablet Take 7 tablets (7 mg total) by mouth daily. 180 tablet 11     No current facility-administered medications for this visit.       Following topics were discussed during counseling:    1. Indications for medication, dosage and administration.     A. Mavyret (100/40 mg) 3 tablets to take once daily with food.      2. Common and rare side effects of medications and management strategies.   A. Fatigue and headache  B. Risk of hepatic decompensation/failure in patients with evidence of advanced liver disease.      3. Importance of adherence to regimen, follow-up clinic visits and lab monitoring.     A.  Reviewed treatment follow up protocol including TW#4 and Post-TW#12 to assess for cure. Reviewed phone visit.      4. Drug-drug interaction.    A. Current medications have been reviewed and assessed for possible interaction. Denies use of herbal medication such as milk thistle or [place] John's wart.  Allergies have been verified. Denies use of alcohol.  - Carvedlol: Advised combination may increase carvedilol concentrations. Recommended to continue to monitor BP at home                          - Crestor 10mg : Advised pt of potential increased levels of Crestor if taken with Mavyret. Advised pt to monitor for potential increased side effects from statin (i.e. Muscle ache, dark urine etc) and to notify transplant of these adverse events.                           - Tacrolimus: Advised combination may affect tac levels. Followed closely by transplant team. Instructed to follow instructions from transplant team.  - Antidiabetic medications (insulin glargine, insulin lispro, semaglutide): Advised pt that HCV treatment may enhance the blood glucose-lowering effects of these medications. Recommended to continue monitoring blood glucose at home and reviewed how to manage hypoglycemia.     5. Importance of informing pharmacy and clinic of updated contact information.  Discussed the process of obtaining medication through specialty pharmacy and when approved medication will be delivered to patient's home. Stressed importance of being able to be reached over the phone.    Patient verbalized understanding. Provided contact information for any questions/concerns.     Loura Back, PharmD Candidate    Park Breed, Pharm D., BCPS, BCGP, CPP  [facility]  [address]  [place], [place] [address]  [phone]

## 2021-01-15 ENCOUNTER — Other Ambulatory Visit (HOSPITAL_COMMUNITY)
Admission: RE | Admit: 2021-01-15 | Discharge: 2021-01-15 | Disposition: A | Discharge status: Home / Self Care | Payer: Medicare Other | Source: Ambulatory Visit | Attending: Pediatric Nephrology | Admitting: Pediatric Nephrology

## 2021-01-15 ENCOUNTER — Institutional Professional Consult (permissible substitution): Admit: 2021-01-15 | Discharge: 2021-01-16 | Payer: MEDICARE

## 2021-01-15 DIAGNOSIS — Z940 Kidney transplant status: Principal | ICD-10-CM

## 2021-01-15 DIAGNOSIS — T8610 Unspecified complication of kidney transplant: Secondary | ICD-10-CM | POA: Diagnosis not present

## 2021-01-15 DIAGNOSIS — Z114 Encounter for screening for human immunodeficiency virus [HIV]: Secondary | ICD-10-CM | POA: Insufficient documentation

## 2021-01-15 DIAGNOSIS — Z79899 Other long term (current) drug therapy: Secondary | ICD-10-CM | POA: Insufficient documentation

## 2021-01-15 DIAGNOSIS — Z9483 Pancreas transplant status: Secondary | ICD-10-CM | POA: Insufficient documentation

## 2021-01-15 DIAGNOSIS — D631 Anemia in chronic kidney disease: Secondary | ICD-10-CM | POA: Diagnosis not present

## 2021-01-15 DIAGNOSIS — D899 Disorder involving the immune mechanism, unspecified: Secondary | ICD-10-CM | POA: Insufficient documentation

## 2021-01-15 DIAGNOSIS — N189 Chronic kidney disease, unspecified: Secondary | ICD-10-CM | POA: Insufficient documentation

## 2021-01-15 DIAGNOSIS — Z794 Long term (current) use of insulin: Secondary | ICD-10-CM | POA: Diagnosis not present

## 2021-01-15 DIAGNOSIS — Z789 Other specified health status: Secondary | ICD-10-CM | POA: Insufficient documentation

## 2021-01-15 DIAGNOSIS — Z09 Encounter for follow-up examination after completed treatment for conditions other than malignant neoplasm: Secondary | ICD-10-CM | POA: Diagnosis not present

## 2021-01-15 DIAGNOSIS — B259 Cytomegaloviral disease, unspecified: Secondary | ICD-10-CM | POA: Insufficient documentation

## 2021-01-15 DIAGNOSIS — E1129 Type 2 diabetes mellitus with other diabetic kidney complication: Secondary | ICD-10-CM | POA: Diagnosis not present

## 2021-01-15 DIAGNOSIS — E559 Vitamin D deficiency, unspecified: Secondary | ICD-10-CM | POA: Diagnosis not present

## 2021-01-15 DIAGNOSIS — N390 Urinary tract infection, site not specified: Secondary | ICD-10-CM | POA: Diagnosis not present

## 2021-01-15 LAB — CBC W/ DIFFERENTIAL
BASOPHILS ABSOLUTE COUNT: 0 10*3/uL (ref 0.0–0.1)
BASOPHILS RELATIVE PERCENT: 0 %
EOSINOPHILS ABSOLUTE COUNT: 0.1
EOSINOPHILS RELATIVE PERCENT: 1 %
HEMATOCRIT: 33.6 — ABNORMAL LOW (ref 39.0–52.01)
HEMOGLOBIN: 10.3 g/dL — ABNORMAL LOW (ref 13.0–17.0)
IMMATURE CELLS: 1
LYMPHOCYTES ABSOLUTE COUNT: 0.6 10*3/uL — ABNORMAL LOW (ref 0.7–4.01)
LYMPHOCYTES RELATIVE PERCENT: 8 %
MEAN CORPUSCULAR HEMOGLOBIN CONC: 30.7 g/dL (ref 30.0–36.0)
MEAN CORPUSCULAR HEMOGLOBIN: 28.1 pg (ref 25.0–34.01)
MEAN CORPUSCULAR VOLUME: 91.6 fL (ref 80.0–100.0)
MONOCYTES ABSOLUTE COUNT: 0.8 10*3/uL (ref 0.1–1.0)
MONOCYTES RELATIVE PERCENT: 10 %
NEUTROPHILS ABSOLUTE COUNT: 6.2
NEUTROPHILS RELATIVE PERCENT: 80 %
NUCLEATED RED BLOOD CELLS: 0 % (ref 0.0–0.2)
PLATELET COUNT: 213 10*3/uL (ref 150–4001)
RED BLOOD CELL COUNT: 3.67 — ABNORMAL LOW
RED CELL DISTRIBUTION WIDTH: 15.9 — ABNORMAL HIGH
WHITE BLOOD CELL COUNT: 7.7 10*3/uL (ref 4.0–10.5)

## 2021-01-15 LAB — RENAL FUNCTION PANEL
ANION GAP: 5
BLOOD UREA NITROGEN: 49 mg/dL — ABNORMAL HIGH (ref 6–20)
CALCIUM: 9.2 mg/dL
CHLORIDE: 108 mmol/L (ref 98–111)
CO2: 25 mmol/L (ref 22–32)
CREATININE: 2.34 mg/dL — ABNORMAL HIGH (ref 0.61–1.24)
EGFR MDRD AF AMER: 32 mL/min — ABNORMAL LOW
GLUCOSE RANDOM: 134 mg/dL — ABNORMAL HIGH (ref 70–99)
POTASSIUM: 5.2 mmol/L — ABNORMAL HIGH (ref 3.5–5.1)
SODIUM: 138 mMol/L (ref 135–145)

## 2021-01-15 LAB — PHOSPHORUS
PHOSPHORUS: 3.7
Phosphorus: 3.7 mg/dL (ref 2.5–4.6)

## 2021-01-15 LAB — MAGNESIUM
MAGNESIUM: 1.9
Magnesium: 1.9 mg/dL (ref 1.7–2.4)

## 2021-01-15 LAB — CBC WITH DIFFERENTIAL/PLATELET
Abs Immature Granulocytes: 0.05 10*3/uL (ref 0.00–0.07)
Basophils Absolute: 0 10*3/uL (ref 0.0–0.1)
Basophils Relative: 0 %
Eosinophils Absolute: 0.1 10*3/uL (ref 0.0–0.5)
Eosinophils Relative: 1 %
HCT: 33.6 % — ABNORMAL LOW (ref 39.0–52.0)
Hemoglobin: 10.3 g/dL — ABNORMAL LOW (ref 13.0–17.0)
Immature Granulocytes: 1 %
Lymphocytes Relative: 8 %
Lymphs Abs: 0.6 10*3/uL — ABNORMAL LOW (ref 0.7–4.0)
MCH: 28.1 pg (ref 26.0–34.0)
MCHC: 30.7 g/dL (ref 30.0–36.0)
MCV: 91.6 fL (ref 80.0–100.0)
Monocytes Absolute: 0.8 10*3/uL (ref 0.1–1.0)
Monocytes Relative: 10 %
Neutro Abs: 6.2 10*3/uL (ref 1.7–7.7)
Neutrophils Relative %: 80 %
Platelets: 213 10*3/uL (ref 150–400)
RBC: 3.67 MIL/uL — ABNORMAL LOW (ref 4.22–5.81)
RDW: 15.9 % — ABNORMAL HIGH (ref 11.5–15.5)
WBC: 7.7 10*3/uL (ref 4.0–10.5)
nRBC: 0 % (ref 0.0–0.2)

## 2021-01-15 LAB — BASIC METABOLIC PANEL
Anion gap: 5 (ref 5–15)
BUN: 49 mg/dL — ABNORMAL HIGH (ref 6–20)
CO2: 25 mmol/L (ref 22–32)
Calcium: 9.2 mg/dL (ref 8.9–10.3)
Chloride: 108 mmol/L (ref 98–111)
Creatinine, Ser: 2.34 mg/dL — ABNORMAL HIGH (ref 0.61–1.24)
GFR, Estimated: 32 mL/min — ABNORMAL LOW (ref >60)
Glucose, Bld: 134 mg/dL — ABNORMAL HIGH (ref 70–99)
Potassium: 5.2 mmol/L — ABNORMAL HIGH (ref 3.5–5.1)
Sodium: 138 mmol/L (ref 135–145)

## 2021-01-15 NOTE — Unmapped (Signed)
Called patient.  Per Dr Elvera Maria, patient to get ultrasound of transplanted kidney and have urinalysis and culture.  Patient verbalized understanding.  Patient has been scheduled for ultrasound of his transplant kidney 10/18 at 12 noon.  He will leave urine then also.

## 2021-01-15 NOTE — Unmapped (Signed)
The patient reports they are currently: at home. I spent 25 minutes on the phone with the patient on the date of service. I spent an additional 10 minutes on pre- and post-visit activities on the date of service.     The patient was physically located in [place] or a state in which I am permitted to provide care. The patient and/or parent/guardian understood that s/he may incur co-pays and cost sharing, and agreed to the telemedicine visit. The visit was reasonable and appropriate under the circumstances given the patient's presentation at the time.    The patient and/or parent/guardian has been advised of the potential risks and limitations of this mode of treatment (including, but not limited to, the absence of in-person examination) and has agreed to be treated using telemedicine. The patient's/patient's family's questions regarding telemedicine have been answered.     If the visit was completed in an ambulatory setting, the patient and/or parent/guardian has also been advised to contact their provider???s office for worsening conditions, and seek emergency medical treatment and/or call 911 if the patient deems either necessary.      **THIS PATIENT WAS NOT SEEN IN PERSON TO MINIMIZE POTENTIAL SPREAD OF COVID-19, PROTECT PATIENTS/PROVIDERS, AND REDUCE PPE UTILIZATION.**    PATIENT NAME: Nicholas Strong     MR#: [id number]    DOB: 1965/09/26    [facility]  CONFIDENTIAL SOCIAL WORK    KIDNEY POST TRANSPLANT FOLLOW UP      DATE OF EVALUATION: 01/15/2021    INFORMANTS: Patient/Nicholas Strong    PREFERRED LANGUAGE: English     INTERPRETER UTILIZED: N/A    TXP CARE TEAM:   Post Transplant RN CoordinatorLaury Deep [phone]  Primary Transplant Provider: Bufford Buttner, Anibal Henderson, [facility], Sorabh Kapoor, Edwin Mosetta Putt, Leafy Half, [place] [facility]    REFERRAL INFORMATION:    Mr.  Strong is a 55 y.o. male is s/p transplant for kidney transplantation . CSW follows up to assess recovery since DC.    TRANSPLANT DATE:   12/04/2020 (Kidney)    MOST RECENT HOSPITAL ADMISSION (@ [place]):   Previous admit date: 12/03/2020 to 12/09/20    FUTURE APPOINTMENTS (@ [place]):   Future Appointments   Date Time Provider Department Center   01/24/2021  9:00 AM Leafy Half, MD Shawnie Pons [facility]       HOME HEALTH/DME NEEDS AT LAST DC:   HH: N/A   Services: N/A   Contact: N/A  DME:  prothestic right leg/2016--[employer]*  Other: N/A  Other Remaining:   ??? Ureter stent: No  ??? Staples: No  ??? Surgical drains x0: No  ??? PD Cath: No  ??? Central line: N/A     LIFESTYLE:  Physical activity:  Good  Nutrition/Appetite:  Good  Sleep: improving; frequency of urination is improving    SOCIAL HISTORY & CAREGIVING PLAN:  Citizenship Status: [place] Citizen  Social History: originally from [place]; moved to [place] ~2015  Marital Status: married  Lives with: spouse/[name]  Children/Dependents:  two adult daughters (one in [place], [place] and one in [place]). Also has a granddaughter.    Supports: parents/deceased  Living situation: Apartment, good repair  Transportation: wife/[name]; pt cleared to resume driving    Pt reports that wife/[name] has been his primary caregiver and has taken PTO.  It does not appear that she explored FMLA and pt reports that he is now home alone during the evenings (wife works night shift); she  is able to provide transportation during the day.    COMPLIANCE HISTORY:  Medication Adherence: Good; feel coming along  Medication Concerns: denied problems taking medications, concerns about side effects, affordability, problems obtaining medications, and difficulty remembering medications  Other Adherence: Good     Side Effects: none     INSURANCE:  [employer] assistance: N/A  Payer/Plan Subscriber Name Rel Member # Group #   [place] [facility]* Nicholas Strong Self [id number] [phone]      [address] [address]   MEDICAID [place] MEDIC[place]* [facility] Self [id number] P [address] [address]     Dialysis Start Date:  09/08/2014     SSDI eligibility: predate HD    ESRD Medicare eligibility: 12/04/20     65th BD: 05/01/2030    30 mon COB period: expired    Access to other insurance plans?: could be eligible for insurance via wife's employment w/ Forensic scientist; if benefits precede HD, they could remain stable following surgery    INCOME:   Pt/SSDI; reports that his benefits predate HD and were based mostly on issues with his right leg and ultimate amputation.  Pt states that issues began in 2008 with a nonhealing hairline fracture.  He eventually needed ~7-8 different surgeries over the years, was unable to work, and finally had to have the leg amputated.    Wife/employed FT w/ [employer] (night shift).    ATTITUDE ABOUT TRANSPLANT:  Expectations: I guess so...; I've been waiting for this for 6 years.  My goal was just to stop dialysis and that is now out of the way.  A new journey has begun [for me]...  Fears/Concerns: denies, but admits to being impatient during his recovery  What would you change?:  none  Rec'd Info on Ltr to Donor Family: yes; considering    MENTAL HEALTH HISTORY: reflective of current   Current issues/mood: feel has been all right; wife validated, states it has improved since his first week home (less irritable)  Medications: denies  Therapy: denies  SI/HI: denies     SUBSTANCE HISTORY: reflective of current   Tobacco: denies  Alcohol: denies  Illicit Substances: denies  OTC/Supplements: denies    PAIN HISTORY: reflective of current  Current : managemable; improving  Current use of pain medication/pain control: denies     SUMMARY:  Spoke w/ pt today regarding update post stent removal.  Pt reports some anxiety about pending Hep C treatment.  Appears that clinical has been submitted for insurance approval and they are waiting on this now to start treatment.  Med changes have been made recently to provide assistance w/ lower copays.      Pt had many questions regarding medicare open enrollment and potentially changing his current plan to decrease his OOP/copays for speciality care.  Offered general information about these plans in comparison to original Medicare.  Provided pt with some key areas to explore and make sure that he is aware/understands available benefits.  Pt thanked this CSW for the information.  He agreed to explore coverage under the new plan and call back to this CSW with any further questions.     RECOMMENDATIONS:   1. Pt to call back to this Clinical research associate w/ any future questions  2. No specific f/up needed at this time.    Lowella Petties, LCSW, CCTSW  Transplant Case Manager/Social Worker  [facility]  Completed: 01/15/21

## 2021-01-15 NOTE — Unmapped (Signed)
Returned patient's call.  He reports that he isn't feeling well today.  He describes feeling full and like he needs to go to the bathroom and describes a feeling of a dull fullness.  He states that even after using the bathroom, the sensation doesn't go away.  He is concerned that this may be related to his Hepatitis C diagnosis.  Discussed with patient plan to start Hep C medication once insurance approval received as well as plan to notify Dr Elvera Maria about symptoms.  Also spoke with patient about plan to start Envarsus once dose is confirmed, but that he should continue to take his Prograf until then.      Patient verbalized understanding of all these things.  Coordinator will follow up with patient when appropriate.

## 2021-01-16 ENCOUNTER — Ambulatory Visit: Admit: 2021-01-16 | Discharge: 2021-01-17 | Payer: MEDICARE

## 2021-01-16 DIAGNOSIS — Z940 Kidney transplant status: Principal | ICD-10-CM

## 2021-01-16 DIAGNOSIS — Z09 Encounter for follow-up examination after completed treatment for conditions other than malignant neoplasm: Secondary | ICD-10-CM | POA: Diagnosis not present

## 2021-01-16 DIAGNOSIS — Z905 Acquired absence of kidney: Secondary | ICD-10-CM | POA: Diagnosis not present

## 2021-01-16 DIAGNOSIS — N200 Calculus of kidney: Secondary | ICD-10-CM | POA: Diagnosis not present

## 2021-01-16 DIAGNOSIS — N271 Small kidney, bilateral: Secondary | ICD-10-CM | POA: Diagnosis not present

## 2021-01-16 LAB — URINALYSIS WITH CULTURE REFLEX
BILIRUBIN UA: NEGATIVE
BLOOD UA: NEGATIVE
GLUCOSE UA: NEGATIVE
KETONES UA: NEGATIVE
NITRITE UA: NEGATIVE
PH UA: 5.5 (ref 5.0–9.0)
RBC UA: 1 /HPF (ref <=3)
SPECIFIC GRAVITY UA: 1.023 (ref 1.003–1.030)
SQUAMOUS EPITHELIAL: <1 /HPF (ref 0–5)
UROBILINOGEN UA: <2
WBC UA: 10 /HPF — ABNORMAL HIGH (ref <=2)

## 2021-01-16 LAB — HLA DS POST TRANSPLANT
ANTI-DONOR HLA-A #1 MFI: 50 MFI
ANTI-DONOR HLA-A #2 MFI: 50 MFI
ANTI-DONOR HLA-B #1 MFI: 0 MFI
ANTI-DONOR HLA-B #2 MFI: 0 MFI
ANTI-DONOR HLA-C #1 MFI: 32 MFI
ANTI-DONOR HLA-C #2 MFI: 0 MFI
ANTI-DONOR HLA-DP AG #1 MFI: 74 MFI
ANTI-DONOR HLA-DQB #1 MFI: 148 MFI
ANTI-DONOR HLA-DQB #2 MFI: 29 MFI
ANTI-DONOR HLA-DR #1 MFI: 60 MFI
ANTI-DONOR HLA-DR #2 MFI: 7 MFI

## 2021-01-16 LAB — FSAB CLASS 2 ANTIBODY SPECIFICITY: HLA CL2 AB RESULT: NEGATIVE

## 2021-01-16 LAB — FSAB CLASS 1 ANTIBODY SPECIFICITY: HLA CLASS 1 ANTIBODY RESULT: NEGATIVE

## 2021-01-16 LAB — HCV RNA DIAGNOSIS, NAA
HCV RNA, Quantitation: 809000 IU/mL
HCV RNA, log10: 5.908 log10 IU/mL

## 2021-01-16 MED ORDER — TACROLIMUS XR 1 MG TABLET,EXTENDED RELEASE 24 HR
ORAL_TABLET | Freq: Every day | ORAL | 11 refills | 30.00000 days | Status: CP
Start: 2021-01-16 — End: ?

## 2021-01-16 NOTE — Unmapped (Signed)
[facility] Specialty Medication Onboarding    Specialty Medication: MAVYRET 100-40 mg tablet (glecaprevir-pibrentasvir)  Prior Authorization: Approved   Financial Assistance: No - copay  <$25  Final Copay/Day Supply: $0 / 28    Insurance Restrictions: None     Notes to Pharmacist: n/a    The triage team has completed the benefits investigation and has determined that the patient is able to fill this medication at [facility]. Please contact the patient to complete the onboarding or follow up with the prescribing physician as needed.

## 2021-01-16 NOTE — Unmapped (Signed)
Called patient.  Per pharmacy, patient to start 6 mg Envarsus daily. Spoke with patient who had not taken his Prograf yet.  Patient verbalized understanding and plans to start his first dose this morning.

## 2021-01-16 NOTE — Unmapped (Addendum)
[facility]   Patient Onboarding/Medication Counseling    Nicholas Strong is a 55 y.o. male with Hepatitis C who I am counseling today on initiation of therapy.  I am speaking to the patient.    Was a Nurse, learning disability used for this call? No    Verified patient's date of birth / HIPAA.    Specialty medication(s) to be sent: Infectious Disease: Mavyret      Non-specialty medications/supplies to be sent: Ozempic 1mg  and cetirizine 10mg       Medications not needed at this time: n/a         Mavyret (glecaprevir and pibrentasvir) 100mg /40mg     Planned Start Date: 01/19/21    Has the patient been told to call his transplant coordinator when her receives the medication? Yes      Medication & Administration     Dosage: Take three tablets by mouth once daily for 8 weeks.    Administration: Take with food.    Adherence/Missed dose instructions:   ??? Take missed dose with food as soon as you think about it.   ??? If it has been more than 18 hours since the missed dose, skip the missed dose and resume with your next scheduled dose.  ??? Do not take extra doses or 2 doses at the same time.    Goals of Therapy     The goal of therapy is to cure the patient of Hepatitis C. Patients who have undetectable HCV RNA in the serum, as assessed by a sensitive polymerase chain reaction (PCR) assay, ?12 weeks after treatment completion are deemed to have achieved SVR (cure). (www.hcvguidelines.org)     Side Effects & Monitoring Parameters     Common Side Effects:  ??? Headache  ??? Fatigue   ??? Nausea    The following side effects should be reported to the provider:  ??? Signs of liver problems like dark urine, feeling tired, lack of hunger, upset stomach or stomach pain, light-colored stools, throwing up, or yellow skin/eyes.  ??? Signs of an allergic reaction, such as rash; hives; itching; red, swollen, blistered, or peeling skin with or without fever.   ??? If you have wheezing; tightness in the chest or throat; trouble breathing, swallowing, or talking; unusual hoarseness; or swelling of the mouth, face, lips, tongue, or throat, call 911 or go to the closest emergency department (ED).     Monitoring Parameters: (AASLD-IDSA Guidelines)    Within 6 months prior to starting treatment:  ??? Complete blood count (CBC).  ??? International normalized ratio (INR) if clinically indicated.   ??? Hepatic function panel: albumin, total and direct bilirubin, ALT, AST, and Alkaline Phosphatase levels.  ??? Calculated glomerular filtration rate (eGFR).  ??? Pregnancy testing within 1 month prior to starting treatment in females of childbearing age  Any time prior to starting treatment:  ??? Test for HCV genotype.  ??? Assess for hepatic fibrosis.  ??? Quantitative HCV RNA (HCV viral load).  ??? Assess for active HBV coinfection: HBV surface antigen (HBsAg); If HBsAg positive, then should be assessed for whether HBV DNA level meets AASLD criteria for HBV treatment.  ??? Assess for evidence of prior HBV infection and immunity: HBV core antibody (anti-HBc) and HBV surface antibody (anti-HBS).   ??? Assess for HIV coinfection.    Monitoring during treatment:   ??? Diabetics should monitor for signs of hypoglycemia.  ??? Patients on warfarin should monitor for changes in INR.  ??? Pregnancy test monthly in females of childbearing  age.  ??? For HBsAg positive patients not already on HBV therapy because baseline HBV DNA level does not meet treatment criteria:   o Initiate prophylactic HBV antiviral therapy OR  o Monitor HBV DNA levels monthly during and immediately after Mavyret therapy.    After treatment to document a cure:   ??? Quantitative HCV viral load 12 weeks after completion of therapy.    Contraindications, Warnings, & Precautions     Contraindications: Moderate or severe hepatic impairment (Child-Pugh class B or C); history of hepatic decompensation; coadministration with atazanavir or rifampin.    Disease-related concerns:   o Hepatitis B virus reactivation.   o Risk of hepatic decompensation/failure in patients with evidence of advanced liver disease.    Drug/Food Interactions     ??? Medication list reviewed in Epic. The patient was instructed to inform the care team before taking any new medications or supplements.   ??? Drug interactions.  o Tacrolimus: Mavyret can alter tacrolimus levels and his dose of tac may need adjusting based on blood levels  o Carvedilol: Advised combination may increase carvedilol concentrations. Recommended to continue to monitor BP and HR at home and to watch for any signs of low BP such as dizziness.  o Rosuvastatin: Mavyret may increase rosuvastatin.  Told to report signs of toxicity such as muscle pain or muscle pain combined with darkened urine.  o Diabetic medications: As the HCV clears the BG control will be tighter.  I told him to continue to watch his BG closely and watch for signs of low blood sugar such as dizziness or light headedness.  I told him his diabetic medications may need dose adjustments.  ??? Encourage minimizing alcohol intake: denies  alcohol  ??? Potential interactions: Clearance of HCV infection with direct acting antivirals may lead to changes in hepatic function, which may impact the safe and effective use of concomitant medications. Frequent monitoring of relevant laboratory parameters (e.g., International Normalized Ratio [INR] in patients taking warfarin, blood glucose levels in diabetic patients) or drug concentrations of concomitant medications such as cytochrome P450 substrates with a narrow therapeutic index (e.g. certain immunosuppressants) is recommended to ensure safe and effective use. Dose adjustments of concomitant medications may be necessary.    Storage, Handling Precautions, & Disposal     ??? Store in the original container at room temperature.   ??? Store in a dry place. Do not store in a bathroom.   ??? Keep all drugs in a safe place. Keep all drugs out of the reach of children and pets.   ??? Disposal: You should NOT have any Mavyret to throw away.      Current Medications (including OTC/herbals), Comorbidities and Allergies     Current Outpatient Medications   Medication Sig Dispense Refill   ??? acetaminophen (TYLENOL) 500 MG tablet Take 1-2 tablets (500-1,000 mg total) by mouth every six (6) hours as needed for pain or fever. 60 tablet 11   ??? acyclovir (ZOVIRAX) 400 MG tablet Take 1 tablet (400 mg total) by mouth Two (2) times a day. 60 tablet 2   ??? aspirin (ECOTRIN) 81 MG tablet Take 1 tablet (81 mg total) by mouth daily. 30 tablet 11   ??? carvediloL (COREG) 12.5 MG tablet Take 1 tablet (12.5 mg total) by mouth Two (2) times a day. 60 tablet 11   ??? cetirizine (ZYRTEC) 10 MG tablet Take 1 tablet (10 mg total) by mouth daily. 30 tablet 11   ??? chlorthalidone (HYGROTON) 25 MG tablet  Take 1 tablet (25 mg total) by mouth every morning. 30 tablet 11   ??? cyanocobalamin (VITAMIN B-12) 100 MCG tablet Take 1 tablet (100 mcg total) by mouth daily. 100 tablet 11   ??? diphenhydrAMINE (BENADRYL) 25 mg capsule Take 1 capsule (25 mg total) by mouth nightly as needed for itching. 48 capsule 0   ??? docusate sodium (COLACE) 100 MG capsule Take 1 capsule (100 mg total) by mouth two (2) times a day as needed for constipation. 60 capsule 11   ??? glecaprevir-pibrentasvir (MAVYRET) 100-40 mg tablet Take 3 tablets by mouth daily. Take with food. 1 4-week carton 1   ??? insulin glargine (BASAGLAR, LANTUS) 100 unit/mL (3 mL) injection pen Inject 0.5 mL (50 Units total) under the skin nightly. 15 mL 9   ??? insulin lispro (HUMALOG) 100 unit/mL injection pen Inject 3 units with breakfast and lunch. Plus sliding scale insulin TID with meals (2 units for every 50 > 150). Max daily dose: 42 units 15 mL 11   ??? levothyroxine (SYNTHROID) 75 MCG tablet Take 1 tablet (75 mcg total) by mouth daily. 30 tablet 11   ??? magnesium oxide-Mg AA chelate (MAGNESIUM, AMINO ACID CHELATE,) 133 mg Take 1 tablet by mouth Two (2) times a day. 100 tablet 11   ??? melatonin 5 mg tablet Take by mouth nightly as needed. 60 tablet 5   ??? mycophenolate (MYFORTIC) 180 MG EC tablet Take 3 tablets (540 mg total) by mouth Two (2) times a day. Adjust dose per medication card. 180 tablet 11   ??? nitroglycerin (NITROSTAT) 0.4 MG SL tablet Place 1 tablet (0.4 mg total) under the tongue continuous as needed for chest pain. Maximum of 3 doses in 15 minutes. 25 tablet 1   ??? rosuvastatin (CRESTOR) 10 MG tablet Take 1 tablet (10 mg total) by mouth daily. 30 tablet 11   ??? semaglutide (OZEMPIC) 1 mg/dose (4 mg/3 mL) PnIj injection Inject 1 mg under the skin every seven (7) days. 9 mL 3   ??? sulfamethoxazole-trimethoprim (BACTRIM) 400-80 mg per tablet Take 1 tablet (80 mg of trimethoprim total) by mouth 3 (three) times a week. 12 tablet 5   ??? tacrolimus (ENVARSUS XR) 1 mg Tb24 extended release tablet Take 6 tablets (6 mg total) by mouth daily. 180 tablet 11   ??? tacrolimus (PROGRAF) 1 MG capsule Take 4 capsules (4 mg total) by mouth daily AND 3 capsules (3 mg total) nightly. 210 capsule 11     No current facility-administered medications for this visit.       Allergies   Allergen Reactions   ??? Ferric Citrate Nausea And Vomiting       Patient Active Problem List   Diagnosis   ??? ESRD (end stage renal disease) on dialysis (CMS-HCC)   ??? Type 2 diabetes mellitus (CMS-HCC)   ??? Diabetic retinopathy associated with type 2 diabetes mellitus (CMS-HCC)   ??? Type 2 diabetes mellitus with diabetic neuropathy (CMS-HCC)   ??? Benign essential hypertension   ??? Congestive heart failure with LV diastolic dysfunction, NYHA class 1 (CMS-HCC)   ??? CAD (coronary artery disease), native coronary artery   ??? Obstructive sleep apnea syndrome   ??? Kidney transplant status, cadaveric   ??? Renal mass   ??? Nuclear sclerotic cataract of both eyes   ??? Anemia of chronic renal failure   ??? History of below-knee amputation of right lower extremity (CMS-HCC)   ??? Secondary hyperparathyroidism of renal origin (CMS-HCC)   ??? Hemodialysis-associated hypotension   ???  Chronic osteomyelitis of toe of left foot (CMS-HCC)   ??? Cholelithiasis   ??? Charcot's joint, left ankle and foot   ??? Asthma, moderate persistent   ??? Retained orthopedic hardware       Reviewed and up to date in Epic.    Appropriateness of Therapy     Acute infections noted within Epic:  No active infections  Patient reported infection: None    Is medication and dose appropriate based on diagnosis and infection status? Yes    Prescription has been clinically reviewed: Yes      Baseline Quality of Life Assessment      How many days over the past month did your Hepatitis C  keep you from your normal activities? For example, brushing your teeth or getting up in the morning. 0    Financial Information     Medication Assistance provided: Prior Authorization    Anticipated copay of $0.00 reviewed with patient. Verified delivery address.    Delivery Information     Scheduled delivery date: 01/18/21    Expected start date: 01/19/21    Medication will be delivered via UPS to the prescription address in [facility].  This shipment will not require a signature.      Explained the services we provide at [facility] and that each month we would call to set up refills.  Stressed importance of returning phone calls so that we could ensure they receive their medications in time each month.  Informed patient that we should be setting up refills 7-10 days prior to when they will run out of medication.  A pharmacist will reach out to perform a clinical assessment periodically.  Informed patient that a welcome packet, containing information about our pharmacy and other support services, a Notice of Privacy Practices, and a drug information handout will be sent.      The patient or caregiver noted above participated in the development of this care plan and knows that they can request review of or adjustments to the care plan at any time.      Patient or caregiver verbalized understanding of the above information as well as how to contact the pharmacy at [phone] option 4 with any questions/concerns.  The pharmacy is open Monday through Friday 8:30am-4:30pm.  A pharmacist is available 24/7 via pager to answer any clinical questions they may have.    Patient Specific Needs     - Does the patient have any physical, cognitive, or cultural barriers? No    - Does the patient have adequate living arrangements? (i.e. the ability to store and take their medication appropriately) Yes    - Did you identify any home environmental safety or security hazards? No    - Patient prefers to have medications discussed with  Patient     - Is the patient or caregiver able to read and understand education materials at a high school level or above? Yes    - Patient's primary language is  English     - Is the patient high risk? No    - Does the patient require physician intervention or other additional services (i.e. dietary/nutrition, smoking cessation, social work)? No      Roderic Palau  [facility] Specialty Pharmacist

## 2021-01-17 DIAGNOSIS — Z940 Kidney transplant status: Principal | ICD-10-CM

## 2021-01-17 LAB — HEPATITIS C GENOTYPE: HCV Genotype: 3

## 2021-01-17 LAB — TACROLIMUS LEVEL: Tacrolimus (FK506) - LabCorp: 9.2 ng/mL (ref 2.0–20.0)

## 2021-01-17 MED ORDER — TACROLIMUS XR 1 MG TABLET,EXTENDED RELEASE 24 HR
ORAL_TABLET | Freq: Every day | ORAL | 11 refills | 30 days | Status: CP
Start: 2021-01-17 — End: ?

## 2021-01-18 ENCOUNTER — Other Ambulatory Visit (HOSPITAL_COMMUNITY)
Admission: RE | Admit: 2021-01-18 | Discharge: 2021-01-18 | Disposition: A | Discharge status: Home / Self Care | Payer: Medicare Other | Source: Ambulatory Visit | Attending: Pediatric Nephrology | Admitting: Pediatric Nephrology

## 2021-01-18 DIAGNOSIS — B1710 Acute hepatitis C without hepatic coma: Principal | ICD-10-CM

## 2021-01-18 DIAGNOSIS — D899 Disorder involving the immune mechanism, unspecified: Secondary | ICD-10-CM | POA: Insufficient documentation

## 2021-01-18 LAB — BASIC METABOLIC PANEL
ANION GAP: 8 (ref 5–15)
Anion gap: 8 (ref 5–15)
BLOOD UREA NITROGEN: 52 — ABNORMAL HIGH
BUN: 52 mg/dL — ABNORMAL HIGH (ref 6–20)
CALCIUM: 9 mg/dL (ref 8.9–10.3)
CHLORIDE: 107 mmol/L (ref 98–111)
CO2: 24 mmol/L (ref 22–32)
CO2: 24 mmol/L (ref 22–32)
CREATININE: 2.76 /dL — ABNORMAL HIGH (ref 0.61–1.241)
Calcium: 9 mg/dL (ref 8.9–10.3)
Chloride: 107 mmol/L (ref 98–111)
Creatinine, Ser: 2.76 mg/dL — ABNORMAL HIGH (ref 0.61–1.24)
EGFR CKD-EPI AA MALE: 26 — ABNORMAL LOW
GFR, Estimated: 26 mL/min — ABNORMAL LOW (ref >60)
GLUCOSE RANDOM: 126 mg/dL — ABNORMAL HIGH (ref 70–99)
Glucose, Bld: 126 mg/dL — ABNORMAL HIGH (ref 70–99)
MAGNESIUM: 2.1 mg/dL (ref 1.7–2.41)
PHOSPHORUS: 3.5 mg/dL (ref 2.5–4.61)
POTASSIUM: 4.7 mmol/L (ref 3.5–5.11)
Potassium: 4.7 mmol/L (ref 3.5–5.1)
SODIUM: 139 mmol/L (ref 135–145)
Sodium: 139 mmol/L (ref 135–145)

## 2021-01-18 LAB — CBC W/ DIFFERENTIAL
BASOPHILS ABSOLUTE COUNT: 0 10*3/uL (ref 0.0–0.11)
BASOPHILS RELATIVE PERCENT: 0 %
EOSINOPHILS ABSOLUTE COUNT: 0.1 10*3/uL (ref 0.0–0.5)
EOSINOPHILS RELATIVE PERCENT: 1 %
HEMATOCRIT: 32.7 — ABNORMAL LOW (ref 39.0–52.01)
HEMOGLOBIN: 10 g/dL — ABNORMAL LOW (ref 13.0–17.01)
LYMPHOCYTES ABSOLUTE COUNT: 0.5 10*3/uL — ABNORMAL LOW (ref 0.7–4.0)
LYMPHOCYTES RELATIVE PERCENT: 7 %
MEAN CORPUSCULAR HEMOGLOBIN CONC: 30.6 g/dL (ref 30.0–36.0)
MEAN CORPUSCULAR HEMOGLOBIN: 27.7 pg (ref 26.0–34.0)
MEAN CORPUSCULAR VOLUME: 90.6 (ref 80.0–100.01)
MONOCYTES ABSOLUTE COUNT: 0.8 10*3/uL (ref 0.1–1.0)
MONOCYTES RELATIVE PERCENT: 11 %
NEUTROPHILS ABSOLUTE COUNT: 5.8
NEUTROPHILS RELATIVE PERCENT: 80 %
NUCLEATED RED BLOOD CELLS: 0 (ref 0.0–0.21)
PLATELET COUNT: 203 10*3/uL (ref 150–400)
RED BLOOD CELL COUNT: 3.61 MIL/uL — ABNORMAL LOW (ref 4.22–5.81)
RED CELL DISTRIBUTION WIDTH: 16.1 % — ABNORMAL HIGH (ref 11.5–15.51)
WHITE BLOOD CELL COUNT: 7.2

## 2021-01-18 LAB — CBC WITH DIFFERENTIAL/PLATELET
Abs Immature Granulocytes: 0.05 10*3/uL (ref 0.00–0.07)
Basophils Absolute: 0 10*3/uL (ref 0.0–0.1)
Basophils Relative: 0 %
Eosinophils Absolute: 0.1 10*3/uL (ref 0.0–0.5)
Eosinophils Relative: 1 %
HCT: 32.7 % — ABNORMAL LOW (ref 39.0–52.0)
Hemoglobin: 10 g/dL — ABNORMAL LOW (ref 13.0–17.0)
Immature Granulocytes: 1 %
Lymphocytes Relative: 7 %
Lymphs Abs: 0.5 10*3/uL — ABNORMAL LOW (ref 0.7–4.0)
MCH: 27.7 pg (ref 26.0–34.0)
MCHC: 30.6 g/dL (ref 30.0–36.0)
MCV: 90.6 fL (ref 80.0–100.0)
Monocytes Absolute: 0.8 10*3/uL (ref 0.1–1.0)
Monocytes Relative: 11 %
Neutro Abs: 5.8 10*3/uL (ref 1.7–7.7)
Neutrophils Relative %: 80 %
Platelets: 203 10*3/uL (ref 150–400)
RBC: 3.61 MIL/uL — ABNORMAL LOW (ref 4.22–5.81)
RDW: 16.1 % — ABNORMAL HIGH (ref 11.5–15.5)
WBC: 7.2 10*3/uL (ref 4.0–10.5)
nRBC: 0 % (ref 0.0–0.2)

## 2021-01-18 LAB — PHOSPHORUS: Phosphorus: 3.5 mg/dL (ref 2.5–4.6)

## 2021-01-18 LAB — MAGNESIUM: Magnesium: 2.1 mg/dL (ref 1.7–2.4)

## 2021-01-18 NOTE — Unmapped (Signed)
Returned patient's call.  Per patient, he received Maverick and started it today.  Patient also states since taking Envarsus 160's/70's and heart rate was 96 prior to dose of Metoprolol last night.    He reports after taking night time BP meds his systolic was 140's and heart rate was back down to 80's.    He reports this morning his heart rate was in the 80's and BP was 130's systolic, but he was concerned about    Instructed patient to continue to monitor vital signs and notify if his BP becomes more elevated.  If not, coordinator will check in with patient on Monday and patient will follow up in clinic.

## 2021-01-19 LAB — HEPATITIS C GENOTYPE: HCV GENOTYPE: 3

## 2021-01-20 LAB — TACROLIMUS LEVEL: Tacrolimus (FK506) - LabCorp: 8.1 ng/mL (ref 2.0–20.0)

## 2021-01-22 ENCOUNTER — Other Ambulatory Visit (HOSPITAL_COMMUNITY)
Admission: RE | Admit: 2021-01-22 | Discharge: 2021-01-22 | Disposition: A | Discharge status: Home / Self Care | Payer: Medicare Other | Source: Ambulatory Visit | Attending: Pediatric Nephrology | Admitting: Pediatric Nephrology

## 2021-01-22 DIAGNOSIS — Z09 Encounter for follow-up examination after completed treatment for conditions other than malignant neoplasm: Secondary | ICD-10-CM | POA: Diagnosis not present

## 2021-01-22 DIAGNOSIS — E1129 Type 2 diabetes mellitus with other diabetic kidney complication: Secondary | ICD-10-CM | POA: Diagnosis not present

## 2021-01-22 DIAGNOSIS — Z9483 Pancreas transplant status: Secondary | ICD-10-CM | POA: Diagnosis not present

## 2021-01-22 DIAGNOSIS — Z79899 Other long term (current) drug therapy: Secondary | ICD-10-CM | POA: Insufficient documentation

## 2021-01-22 DIAGNOSIS — Z114 Encounter for screening for human immunodeficiency virus [HIV]: Secondary | ICD-10-CM | POA: Diagnosis not present

## 2021-01-22 DIAGNOSIS — Z789 Other specified health status: Secondary | ICD-10-CM | POA: Diagnosis not present

## 2021-01-22 DIAGNOSIS — B259 Cytomegaloviral disease, unspecified: Secondary | ICD-10-CM | POA: Diagnosis not present

## 2021-01-22 DIAGNOSIS — N390 Urinary tract infection, site not specified: Secondary | ICD-10-CM | POA: Insufficient documentation

## 2021-01-22 DIAGNOSIS — D899 Disorder involving the immune mechanism, unspecified: Secondary | ICD-10-CM | POA: Insufficient documentation

## 2021-01-22 DIAGNOSIS — Z940 Kidney transplant status: Secondary | ICD-10-CM | POA: Diagnosis not present

## 2021-01-22 LAB — CBC W/ DIFFERENTIAL
BASOPHILS ABSOLUTE COUNT: 0 10*3/uL (ref 0.0–0.11)
BASOPHILS RELATIVE PERCENT: 0 %
EOSINOPHILS ABSOLUTE COUNT: 0.1 10*3/uL (ref 0.0–0.51)
EOSINOPHILS RELATIVE PERCENT: 2 %
HEMATOCRIT: 33.7 — ABNORMAL LOW (ref 39.0–52.0)
HEMOGLOBIN: 10.2 g/dL — ABNORMAL LOW (ref 13.0–17.0)
IMMATURE CELLS: 1
LYMPHOCYTES ABSOLUTE COUNT: 0.5 10*3/uL — ABNORMAL LOW (ref 0.7–4.0)
LYMPHOCYTES RELATIVE PERCENT: 7 %
MEAN CORPUSCULAR HEMOGLOBIN CONC: 30.3 g/dL
MEAN CORPUSCULAR HEMOGLOBIN: 27.5 pg (ref 26.0–34.01)
MEAN CORPUSCULAR VOLUME: 90.8 (ref 80.0–100.0)
MONOCYTES ABSOLUTE COUNT: 0.6 10*3/uL (ref 0.1–14)
MONOCYTES RELATIVE PERCENT: 9 %
NEUTROPHILS RELATIVE PERCENT: 81 %
NUCLEATED RED BLOOD CELLS: 0 (ref 0.0–0.21)
PLATELET COUNT: 211 10*3/uL (ref 150–400)
RED BLOOD CELL COUNT: 3.71 MIL/uL — ABNORMAL LOW (ref 4.22–5.01)
RED CELL DISTRIBUTION WIDTH: 15.9 % — ABNORMAL HIGH (ref 11.5–15.5)
WHITE BLOOD CELL COUNT: 7.2 10*3/uL (ref 4.0–10.51)

## 2021-01-22 LAB — BASIC METABOLIC PANEL
ANION GAP: 6 (ref 5–15)
Anion gap: 6 (ref 5–15)
BLOOD UREA NITROGEN: 58 mg/dL — ABNORMAL HIGH (ref 6–20)
BUN: 58 mg/dL — ABNORMAL HIGH (ref 6–20)
CALCIUM: 9.2 mg/dL (ref 8.9–10.31)
CHLORIDE: 108 (ref 98–111)
CO2: 26 mmol/L (ref 22–32)
CO2: 26 mmol/L (ref 22–321)
CREATININE: 2.78 mg/dL — ABNORMAL HIGH (ref 0.61–1.24)
Calcium: 9.2 mg/dL (ref 8.9–10.3)
Chloride: 108 mmol/L (ref 98–111)
Creatinine, Ser: 2.78 mg/dL — ABNORMAL HIGH (ref 0.61–1.24)
EGFR CKD-EPI AA MALE: 26 mL/min — ABNORMAL LOW
GFR, Estimated: 26 mL/min — ABNORMAL LOW (ref >60)
GLUCOSE RANDOM: 123 mg/dL — ABNORMAL HIGH (ref 70–99)
Glucose, Bld: 123 mg/dL — ABNORMAL HIGH (ref 70–99)
MAGNESIUM: 2.1 mg/dL (ref 1.7–2.41)
PHOSPHORUS: 3.8 mg/dL (ref 2.5–4.61)
POTASSIUM: 4.9 mmol/L (ref 3.5–5.11)
Potassium: 4.9 mmol/L (ref 3.5–5.1)
SODIUM: 140 (ref 135–145)
Sodium: 140 mmol/L (ref 135–145)

## 2021-01-22 LAB — CBC WITH DIFFERENTIAL/PLATELET
Abs Immature Granulocytes: 0.04 10*3/uL (ref 0.00–0.07)
Basophils Absolute: 0 10*3/uL (ref 0.0–0.1)
Basophils Relative: 0 %
Eosinophils Absolute: 0.1 10*3/uL (ref 0.0–0.5)
Eosinophils Relative: 2 %
HCT: 33.7 % — ABNORMAL LOW (ref 39.0–52.0)
Hemoglobin: 10.2 g/dL — ABNORMAL LOW (ref 13.0–17.0)
Immature Granulocytes: 1 %
Lymphocytes Relative: 7 %
Lymphs Abs: 0.5 10*3/uL — ABNORMAL LOW (ref 0.7–4.0)
MCH: 27.5 pg (ref 26.0–34.0)
MCHC: 30.3 g/dL (ref 30.0–36.0)
MCV: 90.8 fL (ref 80.0–100.0)
Monocytes Absolute: 0.6 10*3/uL (ref 0.1–1.0)
Monocytes Relative: 9 %
Neutro Abs: 5.8 10*3/uL (ref 1.7–7.7)
Neutrophils Relative %: 81 %
Platelets: 211 10*3/uL (ref 150–400)
RBC: 3.71 MIL/uL — ABNORMAL LOW (ref 4.22–5.81)
RDW: 15.9 % — ABNORMAL HIGH (ref 11.5–15.5)
WBC: 7.2 10*3/uL (ref 4.0–10.5)
nRBC: 0 % (ref 0.0–0.2)

## 2021-01-22 LAB — MAGNESIUM: Magnesium: 2.1 mg/dL (ref 1.7–2.4)

## 2021-01-22 LAB — PHOSPHORUS: Phosphorus: 3.8 mg/dL (ref 2.5–4.6)

## 2021-01-22 NOTE — Unmapped (Signed)
[facility] TRANSPL[facility] PHARMACY NOTE  01/24/2021   Nicholas Strong  [id number]    Medication changes today:   1. Increase carvedilol to 25 mg BID  2. Hold aspirin    Education/Adherence tools provided today:  - Provided updated medication list  - Provided additional education on immunosuppression and transplant related medications including reviewing indications of medications, dosing and side effects  - Provided additional education on diabetes management  - Provided additional pill box education    Follow up items:  1. Goal of understanding indications and dosing of immunosuppression medications  2. BG  3. BP/HR  4. Kidney Bx  5. HCV VL monitoring    Next visit with pharmacy in 1 month  ____________________________________________________________________    Nicholas Strong is a 55 y.o. male s/p deceased kidney transplant on Dec 30, 2020 (Kidney) 2/2 T2DM polyneuropathy, renal stone, and RCC (s/p left nephrectomy).     Immunologic Risk: cPRA 0,, HLA MM 5/6, and first transplant    Induction Agent : thymoglobulin    Donor Factors: KDPI 70%, DBD and HCV NAT+    Other PMH significant for Hypertension, CAD and Hypothyroidism  Post op course complicated by [place] on POD0 showed elevated velocity in the main renal artery at the level of the anastomosis, likely secondary to post-op edema    Rejection History: NTD  Infection History: NTD  ___________________________________________________________________    Last seen by pharmacy 2 weeks ago    Interval History: Started Mavyret 01/17/21, Envarsus started 10/18    Seen by pharmacy today for: medication management, blood glucose management and education and pill box fill and adherence education    CC:  Patient complains of  ongoing runny nose and more recently having blood in nasal secretions.    General: Weight gain (12lb since last visit)   Neuro: Tremors (mild)  CV: racing heart  Resp: No issues  GI: Diarrhea and Nausea (improving)  GU: No issues  Derm: No issues  Psych: No issues.    Fluid Status:   Edema no, SOB no  Intake: 60 - 80 oz daily  Output: not recording, urinating regularly   Plan: Continue to monitor.    Vitals:    01/24/21 0934   BP: 179/84   Pulse: 87   Temp: 36 ??C (96.8 ??F)     ___________________________________________________________________    Allergies   Allergen Reactions   ??? Ferric Citrate Nausea And Vomiting       Medications reviewed in EPIC medication station and updated today by the clinical pharmacist practitioner.    Current Outpatient Medications   Medication Instructions   ??? acetaminophen (TYLENOL) 500-1,000 mg, Oral, Every 6 hours PRN   ??? acyclovir (ZOVIRAX) 400 mg, Oral, 2 times a day (standard)   ??? aspirin (ECOTRIN) 81 MG tablet Take 1 tablet (81 mg total) by mouth daily.   ??? carvediloL (COREG) 25 mg, Oral, 2 times a day (standard)   ??? cetirizine (ZYRTEC) 10 mg, Oral, Daily (standard)   ??? chlorthalidone (HYGROTON) 25 mg, Oral, Every morning   ??? diphenhydrAMINE (BENADRYL) 25 mg, Oral, Nightly PRN   ??? glecaprevir-pibrentasvir (MAVYRET) 100-40 mg tablet 3 tablets, Oral, Daily (standard), Take with food.   ??? insulin glargine (BASAGLAR, LANTUS) 50 Units, Subcutaneous, Nightly   ??? insulin lispro (HUMALOG) 100 unit/mL injection pen Inject 3 units with breakfast and lunch. Plus sliding scale insulin TID with meals (2 units for every 50 > 150). Max daily dose: 42 units   ??? levothyroxine (SYNTHROID) 75 mcg, Oral,  Daily (standard)   ??? magnesium oxide-Mg AA chelate (MAGNESIUM, AMINO ACID CHELATE,) 133 mg 1 tablet, Oral, 2 times a day (standard)   ??? melatonin 5 mg tablet Oral, Nightly PRN   ??? mycophenolate (MYFORTIC) 540 mg, Oral, 2 times a day (standard), Adjust dose per medication card.   ??? nitroglycerin (NITROSTAT) 0.4 mg, Sublingual, Continuous PRN, Maximum of 3 doses in 15 minutes.    ??? OZEMPIC 1 mg, Subcutaneous, Every 7 days   ??? rosuvastatin (CRESTOR) 10 mg, Oral, Daily (standard)   ??? sulfamethoxazole-trimethoprim (BACTRIM) 400-80 mg per tablet 80 mg of trimethoprim, Oral, 3 times weekly   ??? tacrolimus (ENVARSUS XR) 6 mg, Oral, Daily (standard)         GRAFT FUNCTION: worsening  Lab Results   Component Value Date    Creatinine 2.90 (H) 01/24/2021    Creatinine 2.78 (H) 01/22/2021    Creatinine 2.76 (H) 01/18/2021    Creatinine 2.34 (H) 01/15/2021       Baseline Scr: TBD  Scr nadir: 2.07  Estimated Creatinine Clearance: 34 mL/min (A) (based on SCr of 2.9 mg/dL (H)).    Proteinuria/UPC: No/ none.  Lab Results   Component Value Date    Protein/Creatinine Ratio, Urine 0.168 01/24/2021    Protein/Creatinine Ratio, Urine 0.166 01/10/2021    Protein/Creatinine Ratio, Urine 0.236 12/27/2020    Protein/Creatinine Ratio, Urine 0.53 (H) 12/11/2020    Protein/Creatinine Ratio, Urine 0.744 12/08/2020       DSA: ntd  Lab Results   Component Value Date    DSA Comment  01/10/2021      Comment:      The HLA antigens listed are donor antigens and the corresponding MFI value.  MFI values >= 1000 are considered positive.  A negative result does not exclude the presence of low level DSA.  Blank donor antigen and MFI fields indicate unavailable donor HLA   type or lack of appropriate single antigen bead to determine DSA.  As such, the presence or absence of DSA to the locus cannot be confirmed.     Donor specific antibody (DSA) testing is performed with a  solid phase multiplex single antigen bead array method.  All procedures and reagents have been validated and performance characteristics determined by the [facility].    Certain of these tests have not been cleared/approved by the U.S. Food and Drug Administration (FDA).  The FDA has determined that such approval/clearance is not necessary because this laboratory is certified under the Clinical Laboratory Improvements   Amendments to perform high complexity testing.  This test is used for clinical purposes.  It should not be regarded as investigational or for research.  All HLA typings are performed using molecular methodologies.  HLA-A, B, C, DR, and DQ results are   serological equivalents of the molecular type.           Zero hour biopsy:       Biopsies to date: NTD - plan to schedule for kidney bx      CURRENT IMMUNOSUPPRESSION:    Tacrolimus (Envarsus) 6 mg daily  Tacrolimus Goal: 8 - 10   Mycophenolate sodium (Myfortic) 540 mg BID      IMMUNOSUPPRESSION DRUG LEVELS:  Lab Results   Component Value Date    Tacrolimus, Trough 6.0 01/24/2021    Tacrolimus, Trough 9.4 01/10/2021    Tacrolimus, Trough 8.8 12/27/2020    Tacrolimus, Timed 8.1 01/18/2021    Tacrolimus, Timed 8.4 01/08/2021    Tacrolimus, Timed 14.5 01/04/2021  Envarsus level is accurate 24 hour trough, took tac at 9pm last night     Patient is tolerating immunosuppression well    WBC/ANC:  wnl  Lab Results   Component Value Date    WBC 7.3 01/24/2021    WBC 7.2 01/22/2021     Plan: Will maintain current immunosuppression. Continue to monitor.      OI Prophylaxis:   CMV Status: D- /R-, low risk.   CMV prophylaxis: acyclovir 400 mg bid x 3 months per protocol.  No results found for: CMVCP    PCP Prophylaxis: bactrim SS 1 tab MWF x 6 months.    Thrush: completed in hospital    Patient is  tolerating infectious prophylaxis well    Plan: Continue per protocol. Continue to monitor.    HCV Donor+ Organ/GT3  Plan: Mavyret started 01/19/21      CV Prophylaxis: asa 81 mg   The ASCVD Risk Score (Goff DC Montez Hageman, et al., 2013) failed to return a valid value for an unknown reason.  No results found for: LIPID  Statin therapy: Indicated; currently on rosuvastatin 10 mg (switched from prior atorvastatin 40 mg d/t potential use of Mavyret outpatient for HCV based on donor HCV NAT+ status)  Plan: no changes. Continue to monitor       BP: Goal < 140/90. Clinic vitals reported above  Home BP ranges: 160 - 170/70 - 7970.   Current meds include: carvedilol 12.5 mg BID, chlorthalidone 25 mg   Plan: mostly out of goal. Increase carvedilol to 25 mg BID. Continue to monitor    Anemia of CKD:  H/H:   Lab Results   Component Value Date    HGB 10.0 (L) 01/24/2021     Lab Results   Component Value Date    HCT 30.7 (L) 01/24/2021     Iron panel:  Lab Results   Component Value Date    IRON 143 01/10/2021    TIBC 244 (L) 01/10/2021    FERRITIN 1,982.2 (H) 01/10/2021     Lab Results   Component Value Date    Iron Saturation (%) 59 (H) 01/10/2021     Prior ESA use: none  Plan: stable. No change.  Continue to monitor.     DM:   Lab Results   Component Value Date    A1C 5.3 12/03/2020   . Goal A1c < 7  History of Dm? Yes: T2DM since 1997  Established with endocrinologist/PCP for BG managment? No  Currently on:   Lantus 50U daily (taking in the morning)  Humalog 3U with breakfast and lunch + SSI  Ozempic 1 mg weekly (increased last week)    Home BS log:  - Fasting AM 120 - 170  - Pre-meal: 96 - 160    Diet: pt eats 2 meals per day (assessing insulin either at lunch or dinner time depending on when he eats)  Exercise: not assessed  Hypoglycemia: no  Plan: No change.  Continue to monitor.      Electrolytes: wnl, K+ has been upper limit  Lab Results   Component Value Date    Potassium 5.2 (H) 01/24/2021    Sodium 143 01/24/2021    Magnesium 2.0 01/24/2021    CO2 23.4 01/24/2021    Phosphorus 3.8 01/24/2021     Meds currently on: none  Plan: No change.  Continue to monitor     GI/BM: pt reports normal BMs outside of a couple isolated incidences of diarrhea  Meds currently on:  docusate PRN (daily)  Plan: No change.  Continue to monitor    Pain: pt reports no pain   Meds currently on: APAP PRN (using occasionally for pain at amputation site)  Plan: No change. Continue to monitor    Bone health:   Vitamin D Level: none available. Goal > 30.   No results found for: VITDTOT, VITDTOTAL, VITDTOTAL, VITDTOTAL, VITDTOTAL    Lab Results   Component Value Date    Calcium 9.5 01/24/2021    Calcium 9.2 01/22/2021     Last DEXA results:  none available  Current meds include: none   Plan: Vitamin D level  needs to be drawn with next lab schedule 3 months after transplant,assess supplementation needs. Continue to monitor.     Women's/Men's Health:  Nicholas Strong is a 55 y.o. male. Patient reports no men's/women's health issues  Plan: Continue to monitor    Hypothyroidism:  Current meds: levothyroxine 75 mcg  Plan: no changes. Continue to monitor and follow-up with TSH.    Sleep:  Patient is sleeping okay since discharge (waking up at night to pee often), not taking anything for sleep  Current meds: benadryl (not taking), melatonin 5 mg PRN (not taking)  Plan: no changes. Will try melatonin. Continue to monitor    Immunizations:  Influenza [Annual]: Received 2021, 12/27/20    PCV13: Never received  PPSV23: Received 05/2015  PCV20: Never received    Shingrix Zoster [2 doses, 2 - 6 months apart]: Never received    COVID-19 [3 primary doses, 2 boosters]: 1st dose given 06/11/19, 2nd dose given 07/07/19, 3rd dose given 12/31/19 and Booster given 08/31/20    Pharmacy preference:  [facility]   Medication Refills:  n/a  Medication Access:  n/a    Adherence: Patient has average understanding of medications; was not able to independently identify names/doses of immunosuppressants and OI meds.  Patient  does fill their own pill box on a regular basis at home.  Patient brought medication card:yes  Pill box:did not bring  Plan: become more familiar with medications; provided moderate adherence counseling/intervention    Patient was reviewed with Dr. Elvera Maria who was agreement with the stated plan:     During this visit, the following was completed:   BG log data assessment  BP log data assessment  Labs ordered and evaluated  complex treatment plan >1 DS   I spent a total of 30 minutes face to face with the patient delivering clinical care and providing education/counseling.    All questions/concerns were addressed to the patient's satisfaction.  __________________________________________  Olivia Mackie, CPP, PharmD   Abdominal Transplant Clinical Pharmacist Practitioner

## 2021-01-23 DIAGNOSIS — Z940 Kidney transplant status: Principal | ICD-10-CM

## 2021-01-23 DIAGNOSIS — R82998 Other abnormal findings in urine: Principal | ICD-10-CM

## 2021-01-23 LAB — HCV RNA DIAGNOSIS, NAA
HCV RNA, Quantitation: 632 IU/mL
HCV RNA, log10: 2.801 log10 IU/mL

## 2021-01-24 ENCOUNTER — Ambulatory Visit: Admit: 2021-01-24 | Discharge: 2021-01-24 | Payer: MEDICARE

## 2021-01-24 ENCOUNTER — Institutional Professional Consult (permissible substitution): Admit: 2021-01-24 | Discharge: 2021-01-24 | Payer: MEDICARE

## 2021-01-24 DIAGNOSIS — N2889 Other specified disorders of kidney and ureter: Principal | ICD-10-CM

## 2021-01-24 DIAGNOSIS — Z940 Kidney transplant status: Principal | ICD-10-CM

## 2021-01-24 DIAGNOSIS — R7989 Other specified abnormal findings of blood chemistry: Principal | ICD-10-CM

## 2021-01-24 DIAGNOSIS — I151 Hypertension secondary to other renal disorders: Principal | ICD-10-CM

## 2021-01-24 DIAGNOSIS — Z4822 Encounter for aftercare following kidney transplant: Secondary | ICD-10-CM | POA: Diagnosis not present

## 2021-01-24 DIAGNOSIS — E1122 Type 2 diabetes mellitus with diabetic chronic kidney disease: Secondary | ICD-10-CM | POA: Diagnosis not present

## 2021-01-24 DIAGNOSIS — Z794 Long term (current) use of insulin: Secondary | ICD-10-CM | POA: Diagnosis not present

## 2021-01-24 DIAGNOSIS — I2510 Atherosclerotic heart disease of native coronary artery without angina pectoris: Secondary | ICD-10-CM | POA: Diagnosis not present

## 2021-01-24 DIAGNOSIS — E039 Hypothyroidism, unspecified: Secondary | ICD-10-CM | POA: Diagnosis not present

## 2021-01-24 DIAGNOSIS — I10 Essential (primary) hypertension: Secondary | ICD-10-CM | POA: Diagnosis not present

## 2021-01-24 DIAGNOSIS — E119 Type 2 diabetes mellitus without complications: Secondary | ICD-10-CM | POA: Diagnosis not present

## 2021-01-24 DIAGNOSIS — Z79899 Other long term (current) drug therapy: Secondary | ICD-10-CM | POA: Diagnosis not present

## 2021-01-24 DIAGNOSIS — Z85528 Personal history of other malignant neoplasm of kidney: Secondary | ICD-10-CM | POA: Diagnosis not present

## 2021-01-24 LAB — LIPID PANEL
CHOLESTEROL: 62 mg/dL (ref <=200)
HDL CHOLESTEROL: <20 mg/dL — ABNORMAL LOW (ref 40–60)
TRIGLYCERIDES: 110 mg/dL (ref 0–150)
VLDL CHOLESTEROL CAL: 22 mg/dL (ref 12–47)

## 2021-01-24 LAB — COMPREHENSIVE METABOLIC PANEL
ALBUMIN: 3.6 g/dL (ref 3.4–5.0)
ALKALINE PHOSPHATASE: 138 U/L — ABNORMAL HIGH (ref 46–116)
ALT (SGPT): 70 U/L — ABNORMAL HIGH (ref 10–49)
ANION GAP: 10 mmol/L (ref 5–14)
AST (SGOT): 24 U/L (ref <=34)
BILIRUBIN TOTAL: 0.4 mg/dL (ref 0.3–1.2)
BLOOD UREA NITROGEN: 52 mg/dL — ABNORMAL HIGH (ref 9–23)
BUN / CREAT RATIO: 18
CALCIUM: 9.5 mg/dL (ref 8.7–10.4)
CHLORIDE: 110 mmol/L — ABNORMAL HIGH (ref 98–107)
CO2: 23.4 mmol/L (ref 20.0–31.0)
CREATININE: 2.9 mg/dL — ABNORMAL HIGH
EGFR CKD-EPI (2021) MALE: 25 mL/min/{1.73_m2} — ABNORMAL LOW (ref >=60)
GLUCOSE RANDOM: 137 mg/dL — ABNORMAL HIGH (ref 70–99)
POTASSIUM: 5.2 mmol/L — ABNORMAL HIGH (ref 3.4–4.8)
PROTEIN TOTAL: 6.3 g/dL (ref 5.7–8.2)
SODIUM: 143 mmol/L (ref 135–145)

## 2021-01-24 LAB — URINALYSIS
BILIRUBIN UA: NEGATIVE
BLOOD UA: NEGATIVE
GLUCOSE UA: NEGATIVE
KETONES UA: NEGATIVE
LEUKOCYTE ESTERASE UA: NEGATIVE
NITRITE UA: NEGATIVE
PH UA: 6 (ref 5.0–9.0)
PROTEIN UA: NEGATIVE
RBC UA: <1 /HPF (ref <3)
SPECIFIC GRAVITY UA: 1.025 (ref 1.005–1.030)
SQUAMOUS EPITHELIAL: 2 /HPF (ref 0–5)
UROBILINOGEN UA: 0.2
WBC UA: 21 /HPF — ABNORMAL HIGH (ref <2)

## 2021-01-24 LAB — BILIRUBIN, DIRECT: BILIRUBIN DIRECT: 0.2 mg/dL (ref 0.00–0.30)

## 2021-01-24 LAB — CBC W/ AUTO DIFF
BASOPHILS ABSOLUTE COUNT: 0 10*9/L (ref 0.0–0.1)
BASOPHILS RELATIVE PERCENT: 0.5 %
EOSINOPHILS ABSOLUTE COUNT: 0.1 10*9/L (ref 0.0–0.5)
EOSINOPHILS RELATIVE PERCENT: 1.4 %
HEMATOCRIT: 30.7 % — ABNORMAL LOW (ref 39.0–48.0)
HEMOGLOBIN: 10 g/dL — ABNORMAL LOW (ref 12.9–16.5)
LYMPHOCYTES ABSOLUTE COUNT: 0.5 10*9/L — ABNORMAL LOW (ref 1.1–3.6)
LYMPHOCYTES RELATIVE PERCENT: 6.6 %
MEAN CORPUSCULAR HEMOGLOBIN CONC: 32.6 g/dL (ref 32.0–36.0)
MEAN CORPUSCULAR HEMOGLOBIN: 28.2 pg (ref 25.9–32.4)
MEAN CORPUSCULAR VOLUME: 86.7 fL (ref 77.6–95.7)
MEAN PLATELET VOLUME: 8.3 fL (ref 6.8–10.7)
MONOCYTES ABSOLUTE COUNT: 0.7 10*9/L (ref 0.3–0.8)
MONOCYTES RELATIVE PERCENT: 9.1 %
NEUTROPHILS ABSOLUTE COUNT: 6 10*9/L (ref 1.8–7.8)
NEUTROPHILS RELATIVE PERCENT: 82.4 %
NUCLEATED RED BLOOD CELLS: 0 /100{WBCs} (ref <=4)
PLATELET COUNT: 192 10*9/L (ref 150–450)
RED BLOOD CELL COUNT: 3.54 10*12/L — ABNORMAL LOW (ref 4.26–5.60)
RED CELL DISTRIBUTION WIDTH: 17.2 % — ABNORMAL HIGH (ref 12.2–15.2)
WBC ADJUSTED: 7.3 10*9/L (ref 3.6–11.2)

## 2021-01-24 LAB — PROTEIN / CREATININE RATIO, URINE
CREATININE, URINE: 161.8 mg/dL
PROTEIN URINE: 27.2 mg/dL
PROTEIN/CREAT RATIO, URINE: 0.168

## 2021-01-24 LAB — PHOSPHORUS: PHOSPHORUS: 3.8 mg/dL (ref 2.4–5.1)

## 2021-01-24 LAB — MAGNESIUM: MAGNESIUM: 2 mg/dL (ref 1.6–2.6)

## 2021-01-24 LAB — TACROLIMUS LEVEL, TROUGH: TACROLIMUS, TROUGH: 6 ng/mL (ref 5.0–15.0)

## 2021-01-24 LAB — HEPATITIS C GENOTYPE

## 2021-01-24 LAB — TACROLIMUS LEVEL: Tacrolimus (FK506) - LabCorp: 8.9 ng/mL (ref 2.0–20.0)

## 2021-01-24 MED ORDER — CARVEDILOL 25 MG TABLET
ORAL_TABLET | Freq: Two times a day (BID) | ORAL | 11 refills | 30 days | Status: CP
Start: 2021-01-24 — End: 2022-01-24

## 2021-01-24 MED ORDER — ASPIRIN 81 MG TABLET,DELAYED RELEASE
ORAL_TABLET | Freq: Every day | ORAL | 11 refills | 30.00000 days | Status: CP
Start: 2021-01-24 — End: 2022-01-24

## 2021-01-24 NOTE — Unmapped (Signed)
AOBP: Right  arm Large  cuff     1st reading:  186/87  88  2nd reading: 179/89  90  3rd reading: 171/75 83    Average:  179/84  87    96.8

## 2021-01-24 NOTE — Unmapped (Signed)
Transplant Coordinator, Clinic Visit   Pt seen today by transplant nephrologist and transplant pharmacist for follow up, reviewed medications and symptoms.     Assessment  BP:  179/84  BG: 87    Constitutional: Denies fever, chills, night sweats, unintentional weight loss, loss of appetite.  HEAD: with elevate b/p headache , denies dizziness  Eyes: Denies visual changes. Report nose bleeds  ENT: Denies  sore throat.  Cardiovascular:  Denies dyspnea on exertion; chest pain, Denies pressure or palpitations  Respiratory: Report chest pain with elevated b/p; Denies shortness of breath.  Gastrointestinal: Denies abdominal pain, nausea, vomiting, diarrhea or constipation.  Genitourinary: Denies dysuria, polyuria,  hematuria, urgency, hesitancy, or ordor  Musculoskeletal: Denies leg swelling  Skin/Hair: Denies rash, itching, or hair loss  Neurological: Denies numbness, tingling or hand tremors.  Pain: 0/10  Sleeping: ok  Eating: ok  Envarsus last taken at : 9 AM yesterday     Todays   Coreg increased for elevated b/p. Patient advised to hold aspirin for nose bleeds and biopsy; VIR biopsy will be scheduled for elevated Cr 2.9.

## 2021-01-24 NOTE — Unmapped (Signed)
[facility]   TRANSPLANT FOLLOW UP     PCP: Megan Salon, MD   Kidney transplant coordinator: Laury Deep     Date of Visit at Transplant clinic: 01/24/2021     Assessment/Recommendations:   # s/p donor kidney transplant 12/04/20  Native kidney disease is multifactorial: DM2, HTN, and s/p left nephrectomy for RCC  Graft function: Cr up to 2.9, schedule biopsy   DSAs: Not checked yet   Post-surgical issues:  - aspirin for 1 year post-op, HOLD for biopsy  - ureteral stent removed 01/02/21     # Immunosuppression  Tacrolimus (Envarsus) 6 mg daily, goal trough 8-10 ng/mL (Prograf to Envarsus for copay issues)  Mycophenolate (Myfortic) 540 mg BID    # Acute issues today  Allergic rhinitis, now with new nosebleeds: on Zyrtec, nasal drainage plus some nosebleeds, not on Flonase or Nasocort.   - new nosebleeds may be due to his chronic allergic rhinitis with new addition of aspirin plus hypertension (but 6-7 years ago he was on aspirin)  - holding aspirin for kidney biopsy  - Zyrtec and Flonase/Nasocort  - also gave reccs for avoiding dry nasal mucosa  - if not improving consider ENT referral    # BP management   Goal 130/80, as tolerated.   - increase carvedilol to 25 mg BID (previously had orthostatic symptoms at 25 mg but is now hypertensive)  - chlorthalidone 25 mg daily     # Infectious disease  EBV D +/R+, CMV D-/R-, HCV donor Ab +, NAT +  CMV ppx: none (HSV ppx with acyclovir x3 mo)  PJP ppx: Bactrim x6 mo  HCV viremia: Mayvret start date 01/18/21    # Anemia screening  Hg 10.1, improved from 9.8. Last iron labs 10/12 with no role for iron supplementation.    # Cardiovascular:  CAD s/p PCI   The ASCVD Risk Score (Goff DC Montez Hageman, et al., 2013) failed to return a valid value for an unknown reason.   - aspirin 81 mg  - carvedilol (dosed as per above)  - rosuvastatin 10 mg daily    # Electrolytes  Mg normal  K 5.2, gave NKF handout about potassium    # DM  - Basaglar 50 units nightly  - Lispro meal and correction dose  - semaglutide (Ozempic) started 12/21/20, increased 10/12 to 1 mg weekly  - recommend establishing care with Endocrinology (patient will seek local via PCP), hope that CGM will be an option for him, his experience with prior hypoglycemia is a limiting factor for diabetes control     # Comorbidities  RCC s/p left nephrectomy in 2017  OSA on CPAP  Allergies- Zyrtec   Right BKA (2016, chronic osteomyelitis)       # Immunizations  Immunization History   Administered Date(s) Administered   ??? COVID-19 VAC,MRNA,TRIS(12Y UP)(PFIZER)(GRAY CAP) 08/31/2020   ??? COVID-19 VACC,MRNA,(PFIZER)(PF)(IM) 06/11/2019, 07/07/2019, 12/31/2019, 08/31/2020   ??? Hepatitis B, Adult 12/30/2014, 01/27/2015, 03/01/2015, 06/30/2015   ??? INFLUENZA TIV (TRI) 29MO+ W/ PRESERV (IM) 07/31/2011, 01/31/2020   ??? Influenza Vaccine Quad (IIV4 PF) 55mo+ injectable 12/27/2020   ??? Influenza Virus Vaccine, unspecified formulation 01/19/2020   ??? PNEUMOCOCCAL POLYSACCHARIDE 23 05/22/2015   ??? TdaP 03/01/2017       # Cancer screening  Colonoscopy: Recommended  Skin: recommend yearly dermatology evaluation    # Follow up:  Labs: 2x/week  Will schedule kidney biopsy  Visits: RTC 4 weeks      Kidney Transplant History:  Date of Transplant: 12/04/2020 (Kidney)  Type of Transplant: DDKT, DBD  KDPI: 70%  Cold ischemic time: 19 hr 19 min  Warm ischemic time: 188 minutes  cPRA: 0%   HLA match: 5/6 mismatch   Blood type: Donor O, Recipient O POS  ID: CMV D - /R -, EBV D+/R+, HCV donor Ab +, NAT +  Zero-Hour Biopsy: Arteriosclerosis, 10-15% IFTA and mesangial staining of C1q, mild ATI  Native Kidney Disease: DM2, HTN, RCC s/p left nephrectomy   Native kidney biopsy: None    Pre-transplant dialysis course: HD since 09/08/2014  Post-Transplant Course:    Delayed graft function requiring dialysis: None   Other complications: None   Prior Transplants: None  Induction: Thymoglobulin   Early steroid withdrawal: Yes  Rejection Episodes: None     History of Presenting Illness:   - home BPs 160-179 with HRs 90s-106 and gets some headaches and chest pain. He self-increased Coreg once yesterday  - still has some alternating constipation and diarrhea,  intermittent soft stool but it corresponds to Colace  - BG is improved  - one of his primary concerns today is nosebleeds    Concerns about nonadherence: None     Social: Never smoked. Lives with his wife in [place].     Review of Systems:   A 12-system review was negative except as documented in the HPI.    Physical Exam:     BP 179/84 (BP Site: R Arm, BP Position: Sitting, BP Cuff Size: Large)  - Pulse 87  - Temp 36 ??C (96.8 ??F) (Temporal)  - Ht 170.2 cm (5' 7.01)  - Wt (!) 109.8 kg (242 lb)  - BMI 37.89 kg/m??    Constitutional:  Well-appearing in NAD  Eyes:  anicteric sclerae  ENT:  MMM  CV:  RRR, no m/r/g, no JVD, extremities WWP with no edema  Resp:  Good air movement, CTAB  GI:  Abdomen soft, NTND, +bs  MSK:  Grossly normal, exam is limited  Skin:  Normal turgor, no rash  Neuro:  Grossly normal, exam is limited  Psych:  Normal affect  Lymph:  No cervical adenopathy      Allergies:   Allergies   Allergen Reactions   ??? Ferric Citrate Nausea And Vomiting        Current Medications:   Current Outpatient Medications   Medication Sig Dispense Refill   ??? acetaminophen (TYLENOL) 500 MG tablet Take 1-2 tablets (500-1,000 mg total) by mouth every six (6) hours as needed for pain or fever. 60 tablet 11   ??? acyclovir (ZOVIRAX) 400 MG tablet Take 1 tablet (400 mg total) by mouth Two (2) times a day. 60 tablet 2   ??? aspirin (ECOTRIN) 81 MG tablet Take 1 tablet (81 mg total) by mouth daily. 30 tablet 11   ??? carvediloL (COREG) 12.5 MG tablet Take 1 tablet (12.5 mg total) by mouth Two (2) times a day. 60 tablet 11   ??? cetirizine (ZYRTEC) 10 MG tablet Take 1 tablet (10 mg total) by mouth daily. 30 tablet 11   ??? chlorthalidone (HYGROTON) 25 MG tablet Take 1 tablet (25 mg total) by mouth every morning. 30 tablet 11   ??? cyanocobalamin (VITAMIN B-12) 100 MCG tablet Take 1 tablet (100 mcg total) by mouth daily. 100 tablet 11   ??? diphenhydrAMINE (BENADRYL) 25 mg capsule Take 1 capsule (25 mg total) by mouth nightly as needed for itching. 48 capsule 0   ??? glecaprevir-pibrentasvir (MAVYRET) 100-40 mg tablet  Take 3 tablets by mouth daily. Take with food. 1 4-week carton 1   ??? insulin glargine (BASAGLAR, LANTUS) 100 unit/mL (3 mL) injection pen Inject 0.5 mL (50 Units total) under the skin nightly. 15 mL 9   ??? insulin lispro (HUMALOG) 100 unit/mL injection pen Inject 3 units with breakfast and lunch. Plus sliding scale insulin TID with meals (2 units for every 50 > 150). Max daily dose: 42 units 15 mL 11   ??? levothyroxine (SYNTHROID) 75 MCG tablet Take 1 tablet (75 mcg total) by mouth daily. 30 tablet 11   ??? magnesium oxide-Mg AA chelate (MAGNESIUM, AMINO ACID CHELATE,) 133 mg Take 1 tablet by mouth Two (2) times a day. 100 tablet 11   ??? melatonin 5 mg tablet Take by mouth nightly as needed. 60 tablet 5   ??? mycophenolate (MYFORTIC) 180 MG EC tablet Take 3 tablets (540 mg total) by mouth Two (2) times a day. Adjust dose per medication card. 180 tablet 11   ??? nitroglycerin (NITROSTAT) 0.4 MG SL tablet Place 1 tablet (0.4 mg total) under the tongue continuous as needed for chest pain. Maximum of 3 doses in 15 minutes. 25 tablet 1   ??? rosuvastatin (CRESTOR) 10 MG tablet Take 1 tablet (10 mg total) by mouth daily. 30 tablet 11   ??? semaglutide (OZEMPIC) 1 mg/dose (4 mg/3 mL) PnIj injection Inject 1 mg under the skin every seven (7) days. 9 mL 3   ??? sulfamethoxazole-trimethoprim (BACTRIM) 400-80 mg per tablet Take 1 tablet (80 mg of trimethoprim total) by mouth 3 (three) times a week. 12 tablet 5   ??? tacrolimus (ENVARSUS XR) 1 mg Tb24 extended release tablet Take 6 tablets (6 mg total) by mouth daily. 180 tablet 11     No current facility-administered medications for this visit.       Past Medical History:   Past Medical History:   Diagnosis Date   ??? Cancer (CMS-HCC) ??? Chronic kidney disease    ??? Diabetes mellitus (CMS-HCC)         Laboratory studies:   Reviewed recent results.        Electronically signed by:   Leafy Half, MD  [facility]

## 2021-01-24 NOTE — Unmapped (Addendum)
Plan for biopsy:  - we'll ask that this be scheduled with interventional radiology  - on the day of your biopsy, you'll need to not eat or drink after midnight the night before. But, it's ok to take a few sips of water with your morning  - on the day of your biopsy, take all your morning meds. It would be ok to take the Mavyret a little late in order to take with food    Plan for nosebleeds:  Allergic rhinitis will put you at risk of nosebleeds. On top of that background risk, additional things that can tip you into nosebleeds include dry air, dry nostrils, high blood pressure, and aspirin use.  - we are temporarily holding the aspirin (the reason to hold it is for kidney biopsy; long term, because of your heart stent, it is important to consider restarting it, we can discuss at the next visit)  Some other things to try:  - try Flonase or Nasocort nasal spray (use the store brand, not the name brand)  - try using a clean jar of vaseline, a clean Qtip each night, apply a thin layer of vaseline in your nostril. Do not insert the Qtip far into your nostril, as this could cause a worse nosebleed  - try saline nasal spray  - if you're not seeing improvement, let us know and we can refer you to ENT    Labs to once per week  Return in about 4 weeks

## 2021-01-25 LAB — CMV DNA, QUANTITATIVE, PCR: CMV VIRAL LD: NOT DETECTED

## 2021-01-25 NOTE — Unmapped (Signed)
[facility] VIR Biopsy Request Information Sheet     Referring Provider:  Leafy Half, MD   Date of Review:  01/25/2021    Reviewing Provider:  Anson Crofts, MD     Requested Biopsy Site:      Reason for Request:  rising creatinine in kidney transplant     Past Medical History:    Past Medical History:   Diagnosis Date   ??? Cancer (CMS-HCC)    ??? Chronic kidney disease    ??? Diabetes mellitus (CMS-HCC)        Imaging reviewed: 10/17 [place]   Recommended Imaging Modality to Perform Biopsy:  Ultrasound    Comments for Biopsy:  US guided non-targeted in transplanted kidney - schedule for HBO

## 2021-01-25 NOTE — Unmapped (Signed)
[facility] Refill Coordination Note    Specialty Medication(s) to be Shipped:   Transplant: mycophenolate mofetil 180mg     Other medication(s) to be shipped: Acyclovir,rosuvastatin,sulfamethoxazole-trimethoprim     Nicholas Strong, DOB: 04-Aug-1965  Phone: [phone] (home)       All above HIPAA information was verified with patient.     Was a Nurse, learning disability used for this call? No    Completed refill call assessment today to schedule patient's medication shipment from the [facility] [phone]).  All relevant notes have been reviewed.     Specialty medication(s) and dose(s) confirmed: Regimen is correct and unchanged.   Changes to medications: Nicholas Strong reports stopping the following medications: tacrolimus started envarsus receives from [facility]   Changes to insurance: No  New side effects reported not previously addressed with a pharmacist or physician: None reported  Questions for the pharmacist: No    Confirmed patient received a Conservation officer, historic buildings and a Surveyor, mining with first shipment. The patient will receive a drug information handout for each medication shipped and additional FDA Medication Guides as required.       DISEASE/MEDICATION-SPECIFIC INFORMATION        N/A    SPECIALTY MEDICATION ADHERENCE     Medication Adherence    Patient reported X missed doses in the last month: 0  Specialty Medication: mycophenolate 180mg   Patient is on additional specialty medications: No  Patient is on more than two specialty medications: No  Any gaps in refill history greater than 2 weeks in the last 3 months: no  Demonstrates understanding of importance of adherence: yes  Informant: patient  Reliability of informant: reliable  Provider-estimated medication adherence level: good  Patient is at risk for Non-Adherence: No  Confirmed plan for next specialty medication refill: delivery by pharmacy  Refills needed for supportive medications: not needed          Refill Coordination    Has the Patients' Contact Information Changed: No  Is the Shipping Address Different: No         Were doses missed due to medication being on hold? No    mycophenolate 180 mg: 10 days of medicine on hand         REFERRAL TO PHARMACIST     Referral to the pharmacist: Not needed      [facility]     Shipping address confirmed in Epic.     Delivery Scheduled: Yes, Expected medication delivery date: 11/02.     Medication will be delivered via UPS to the prescription address in Epic WAM.    Antonietta Barcelona   [facility] Specialty Technician

## 2021-01-26 LAB — VITAMIN D 25 HYDROXY: VITAMIN D, TOTAL (25OH): 13.5 ng/mL — ABNORMAL LOW (ref 20.0–80.0)

## 2021-01-27 NOTE — Unmapped (Signed)
Patient paged on-call TNC.  Reports blood pressure of 201/90 at ca. 9:30pm; took PM dose of Coreg at ca. 9:00pm.  HR is normal at 76, which is an improvement over the last several days (previously elevated in low 100s).  Discussed with post-transplant TNC Margaretha Glassing who saw patient on 10/26 in clinic.      Called patient back ca. 9:45pm and he reports his BP had decreased to 180/89.  He is asymptomatic; denies headache, blurred vision, chest pain, SOB. Instructed him to retake his pressure again in 1-2 hours and before he takes his dose of Coreg in the morning.  If pressures continue to be elevated above 180 systolic, he will call and page again.  If he becomes symptomatic, he will report to his local ED.  Patient agrees with the above plan.

## 2021-01-27 NOTE — Unmapped (Signed)
Called patient to follow up about blood pressure concerns from 10/28.  He reports that his BP this morning prior to taking Coreg was in the 210s initially and then 170s after retaking several minutes later.     When asked about his urine output, he states that he took two water pills last night before bed to help with swelling and increase in weight and urinated 7-8 times overnight.  States he had a leftover prescription of furosemide from his surgery.     Discussed with Dr. Elvera Maria and she would like him to restart 10mg  amplodipine once daily over the weekend and stop taking Lasix; he will follow up with his team on Monday and have labs drawn.

## 2021-01-28 NOTE — Unmapped (Signed)
[facility] Nephrology and Hypertension  Kidney Transplant Biopsy Note    Date of Biopsy Referral: 01/28/21    Referring Transplant Provider: Madolyn Frieze  Pager: [phone]  Phone: [phone]    Kidney Transplant Coordinator: Laury Deep    Biopsy Fellow  _____________________ at _______________________ otherwise contact referring provider.     (NAME)   (CONTACT - pager/cell phone #)    ---------------------------------------------------------------------------------------------------------------------  Lamar Benes?: No  Patient Name: Nicholas Strong  MR: [id number]  Age: 55 y.o.  Sex: Male Race: Black or African American Ethnicity: Not Hispanic or Latino   DOB:1966/01/07    Procedures: Ultrasound Guided Percutaneous Kidney Biopsy under Moderate Sedation  Tissue Submitted: Kidney  Special Studies Required: LM, IF, EM  ----------------------------------------------------------------------------------------------------------------------  Date of allograft implantation: 12/04/2020 (Kidney)  ABO Incompatible: No  Underlying native kidney disease: multifactorial: DM2, HTN, and s/p left nephrectomy for RCC  Was the diagnosis established by biopsy? No  Previous transplant biopsies: zero-hour biopsy: Arteriosclerosis, 10-15% IFTA and mesangial staining of C1q, mild ATI  Previous kidney transplants: No    Donor Information (if available)  Type of Transplant: DDKT, DBD  KDPI: 70%  Cold ischemic time: 19 hr 19 min  Warm ischemic time: 188 minutes  cPRA: 0%   HLA match: 5/6 mismatch   Blood type: Donor O, Recipient O POS  ID: CMV D - /R -, EBV D+/R+, HCV donor Ab +, NAT +    History/Clinical Diagnosis/Indication for Biopsy:   55 yo male with CAD, RCC, and CKD s/p DDKT 12/04/20 (native kidney disease multifactorial: DM2, HTN, and s/p left nephrectomy for RCC), biopsy for increasing creatinine without proteinuria, without an explanation. KT course notable for HCV NAT+ donor, with post-transplant viremia treated with Mavyret. His zero-hour biopsy had some mesangial staining of C1q. Immunosuppression is Envarsus and Myfortic (early steroid withdrawal), with induction being thymoglobulin and methylprednisolone. Creatinine increasing from 2.4 to 2.9 at time of scheduling the biopsy. At time of biopsy creatinine ____.    ----------------------------------------------------------------------------------------------------------------------  Current Baseline Immunosuppression: tacrolimus (Prograf or Envarsus) and mycophenolate (Cellcept or Myfortic) (please select ALL drugs used)  Specific anti-rejection treatment WITHIN 2 WEEKS before biopsy: No  If yes, what was the type of treatment? None  Patient off immunosuppression?: No  Patient seems adherent to immunosuppression? Yes  Patient is currently back on dialysis? No  Has patient had any prior episodes of rejection? No   If yes, what was the treatment? None  ----------------------------------------------------------------------------------------------------------------------  Donor Specific Antibody Results:  Lab Results   Component Value Date    Donor ID [id number] 01/10/2021    Donor HLA-A Antigen #1 A2 01/10/2021    Anti-Donor HLA-A #1 MFI 50 01/10/2021    Donor HLA-A Antigen #2 A2 01/10/2021    Donor HLA-B Antigen #1 B62 01/10/2021    Anti-Donor HLA-B #1 MFI 0 01/10/2021    Donor HLA-B Antigen #2 B57 01/10/2021    Anti-Donor HLA-B #2 MFI 0 01/10/2021    Donor HLA-C Antigen #1 C10 01/10/2021    Anti-Donor HLA-C #1 MFI 32 01/10/2021    Donor HLA-C Antigen #2 C6 01/10/2021    Anti-Donor HLA-C #2 MFI 0 01/10/2021    Donor HLA-DR Antigen #1 DR1 01/10/2021    Anti-Donor HLA-DR #1 MFI 60 01/10/2021    Donor HLA-DR Antigen #2 DR7 01/10/2021    Anti-Donor HLA-DR #2 MFI 7 01/10/2021    Donor HLA-DQB Antigen #1 DQ9 01/10/2021    Anti-Donor HLA-DQB #1 MFI 148 01/10/2021    Donor HLA-DQB Antigen #2  DQ5 01/10/2021    Anti-Donor HLA-DQB #2 MFI 29 01/10/2021    DSA Comment  01/10/2021      Comment:      The HLA antigens listed are donor antigens and the corresponding MFI value.  MFI values >= 1000 are considered positive.  A negative result does not exclude the presence of low level DSA.  Blank donor antigen and MFI fields indicate unavailable donor HLA   type or lack of appropriate single antigen bead to determine DSA.  As such, the presence or absence of DSA to the locus cannot be confirmed.     Donor specific antibody (DSA) testing is performed with a  solid phase multiplex single antigen bead array method.  All procedures and reagents have been validated and performance characteristics determined by the [facility].    Certain of these tests have not been cleared/approved by the U.S. Food and Drug Administration (FDA).  The FDA has determined that such approval/clearance is not necessary because this laboratory is certified under the Clinical Laboratory Improvements   Amendments to perform high complexity testing.  This test is used for clinical purposes.  It should not be regarded as investigational or for research.  All HLA typings are performed using molecular methodologies.  HLA-A, B, C, DR, and DQ results are   serological equivalents of the molecular type.             Blood Pressure (mmHg):   BP Readings from Last 3 Encounters:   01/24/21 179/84   01/24/21 179/84   01/10/21 116/61     On Anti-hypertensive Therapy: Yes    Urinalysis:  Lab Results   Component Value Date    Color, UA Yellow 01/24/2021    Specific Gravity, UA 1.025 01/24/2021    pH, UA 6.0 01/24/2021    Glucose, UA Negative 01/24/2021    Ketones, UA Negative 01/24/2021    Blood, UA Negative 01/24/2021    Nitrite, UA Negative 01/24/2021    Leukocyte Esterase, UA Negative 01/24/2021    Urobilinogen, UA 0.2 mg/dL 96/07/5407    Bilirubin, UA Negative 01/24/2021     Urine protein/creatinine ratio:   Lab Results   Component Value Date    Protein/Creatinine Ratio, Urine 0.168 01/24/2021       Lab Results   Component Value Date    CREATININE 2.90 (H) 01/24/2021    CREATININE 2.78 (H) 01/22/2021    CREATININE 2.76 (H) 01/18/2021    CREATININE 2.34 (H) 01/15/2021    CREATININE 2.40 (H) 01/10/2021       Glucose:   Lab Results   Component Value Date    Glucose 137 (H) 01/24/2021     HGBA1C:   Lab Results   Component Value Date    Hemoglobin A1C 5.3 12/03/2020     ----------------------------------------------------------------------------------------------------------------------  Clinical signs of infections at time of current biopsy:  BK: No   BK blood viral load: No results found for: BBKQR, BBKCP, BBK10, BBKC     CMV: No   CMV viral load:   Lab Results   Component Value Date    CMV Viral Ld Not Detected 01/24/2021        EBV:No   EBV viral load: No results found for: EBVIU, EBVLOG

## 2021-01-28 NOTE — Unmapped (Signed)
Called to follow up on BPs. First dose amlodipine 10/29    This AM 180/90, improved  Urine output seems normal  No increase in weight over past 3-4 days (he has gained weight over past 2 weeks)    Plan:  - continue amlodipine 10 mg (this is in addition to the carvedilol and chlorthalidone)  - labs tomorrow  - IR biopsy is scheduled for 11/9

## 2021-01-29 ENCOUNTER — Other Ambulatory Visit (HOSPITAL_COMMUNITY)
Admission: RE | Admit: 2021-01-29 | Discharge: 2021-01-29 | Disposition: A | Discharge status: Home / Self Care | Payer: Medicare Other | Source: Ambulatory Visit | Attending: Pediatric Nephrology | Admitting: Pediatric Nephrology

## 2021-01-29 DIAGNOSIS — Z114 Encounter for screening for human immunodeficiency virus [HIV]: Secondary | ICD-10-CM | POA: Diagnosis not present

## 2021-01-29 DIAGNOSIS — Z789 Other specified health status: Secondary | ICD-10-CM | POA: Diagnosis not present

## 2021-01-29 DIAGNOSIS — D631 Anemia in chronic kidney disease: Secondary | ICD-10-CM | POA: Insufficient documentation

## 2021-01-29 DIAGNOSIS — Z9483 Pancreas transplant status: Secondary | ICD-10-CM | POA: Insufficient documentation

## 2021-01-29 DIAGNOSIS — E1129 Type 2 diabetes mellitus with other diabetic kidney complication: Secondary | ICD-10-CM | POA: Diagnosis not present

## 2021-01-29 DIAGNOSIS — B259 Cytomegaloviral disease, unspecified: Secondary | ICD-10-CM | POA: Diagnosis not present

## 2021-01-29 DIAGNOSIS — Z79899 Other long term (current) drug therapy: Secondary | ICD-10-CM | POA: Insufficient documentation

## 2021-01-29 DIAGNOSIS — Z09 Encounter for follow-up examination after completed treatment for conditions other than malignant neoplasm: Secondary | ICD-10-CM | POA: Insufficient documentation

## 2021-01-29 DIAGNOSIS — N390 Urinary tract infection, site not specified: Secondary | ICD-10-CM | POA: Insufficient documentation

## 2021-01-29 DIAGNOSIS — E559 Vitamin D deficiency, unspecified: Secondary | ICD-10-CM | POA: Diagnosis not present

## 2021-01-29 DIAGNOSIS — D899 Disorder involving the immune mechanism, unspecified: Secondary | ICD-10-CM | POA: Diagnosis not present

## 2021-01-29 DIAGNOSIS — Z940 Kidney transplant status: Secondary | ICD-10-CM | POA: Insufficient documentation

## 2021-01-29 LAB — CBC W/ DIFFERENTIAL
BASOPHILS ABSOLUTE COUNT: 0 10*3/uL (ref 0.0–0.11)
BASOPHILS RELATIVE PERCENT: 1
EOSINOPHILS ABSOLUTE COUNT: 0.2 10*3/uL (ref 0.0–0.51)
EOSINOPHILS RELATIVE PERCENT: 2 %
HEMATOCRIT: 30.5 — ABNORMAL LOW (ref 39.0–52.01)
HEMOGLOBIN: 9.3 g/dL — ABNORMAL LOW (ref 13.0–17.01)
IMMATURE CELLS: 1
LYMPHOCYTES ABSOLUTE COUNT: 0.5 10*3/uL — ABNORMAL LOW (ref 0.7–4.01)
LYMPHOCYTES RELATIVE PERCENT: 8
MEAN CORPUSCULAR HEMOGLOBIN CONC: 30.5 g/dL (ref 30.0–36.01)
MEAN CORPUSCULAR HEMOGLOBIN: 27.8 pg (ref 26.0–34.01)
MEAN CORPUSCULAR VOLUME: 91 fL (ref 80.0–100.01)
MONOCYTES ABSOLUTE COUNT: 0.5 (ref 0.1–1.0)
MONOCYTES RELATIVE PERCENT: 8 %
NEUTROPHILS ABSOLUTE COUNT: 5.3
NEUTROPHILS RELATIVE PERCENT: 80 %
NUCLEATED RED BLOOD CELLS: 0 (ref 0.0–0.21)
PLATELET COUNT: 162 10*3/uL (ref 150–400)
RED BLOOD CELL COUNT: 3.35 MIL/uL — ABNORMAL LOW (ref 4.22–5.811)
RED CELL DISTRIBUTION WIDTH: 15.9 — ABNORMAL HIGH (ref 11.5–15.51)
WHITE BLOOD CELL COUNT: 6.5 10*3/uL (ref 4.0–10.5)

## 2021-01-29 LAB — BASIC METABOLIC PANEL
ANION GAP: 7 (ref 5–151)
Anion gap: 7 (ref 5–15)
BLOOD UREA NITROGEN: 67 mg/dL — ABNORMAL HIGH (ref 6–20)
BUN: 67 mg/dL — ABNORMAL HIGH (ref 6–20)
CALCIUM: 8.9 mg/dL (ref 0.9–10.3)
CHLORIDE: 108 mmol/L (ref 98–1111)
CO2: 23 mmol/L (ref 22–32)
CO2: 23 (ref 22–321)
CREATININE: 3.74 mg/dL — ABNORMAL HIGH (ref 0.61–1.241)
Calcium: 8.9 mg/dL (ref 8.9–10.3)
Chloride: 108 mmol/L (ref 98–111)
Creatinine, Ser: 3.74 mg/dL — ABNORMAL HIGH (ref 0.61–1.24)
GFR, Estimated: 18 mL/min — ABNORMAL LOW (ref >60)
GLUCOSE RANDOM: 134 mg/dL — ABNORMAL HIGH (ref 70–99)
Glucose, Bld: 134 mg/dL — ABNORMAL HIGH (ref 70–99)
MAGNESIUM: 2.3 mg/dL (ref 1.7–2.41)
PHOSPHORUS: 4.4 mg/dL (ref 2.5–4.6)
POTASSIUM: 5.4 mmol/L — ABNORMAL HIGH (ref 3.5–5.1)
Potassium: 5.4 mmol/L — ABNORMAL HIGH (ref 3.5–5.1)
SODIUM: 138 mmol/L (ref 135–145)
Sodium: 138 mmol/L (ref 135–145)

## 2021-01-29 LAB — EGFR(MDRD) AF-AM: EGFR MDRD AF AMER: 18 mL/min — ABNORMAL LOW

## 2021-01-29 LAB — VITAMIN D 1,25 DIHYDROXY: VITAMIN D 1,25-DIHYDROXY: 8.3 pg/mL — ABNORMAL LOW

## 2021-01-29 LAB — CBC WITH DIFFERENTIAL/PLATELET
Abs Immature Granulocytes: 0.03 10*3/uL (ref 0.00–0.07)
Basophils Absolute: 0 10*3/uL (ref 0.0–0.1)
Basophils Relative: 1 %
Eosinophils Absolute: 0.2 10*3/uL (ref 0.0–0.5)
Eosinophils Relative: 2 %
HCT: 30.5 % — ABNORMAL LOW (ref 39.0–52.0)
Hemoglobin: 9.3 g/dL — ABNORMAL LOW (ref 13.0–17.0)
Immature Granulocytes: 1 %
Lymphocytes Relative: 8 %
Lymphs Abs: 0.5 10*3/uL — ABNORMAL LOW (ref 0.7–4.0)
MCH: 27.8 pg (ref 26.0–34.0)
MCHC: 30.5 g/dL (ref 30.0–36.0)
MCV: 91 fL (ref 80.0–100.0)
Monocytes Absolute: 0.5 10*3/uL (ref 0.1–1.0)
Monocytes Relative: 8 %
Neutro Abs: 5.3 10*3/uL (ref 1.7–7.7)
Neutrophils Relative %: 80 %
Platelets: 162 10*3/uL (ref 150–400)
RBC: 3.35 MIL/uL — ABNORMAL LOW (ref 4.22–5.81)
RDW: 15.9 % — ABNORMAL HIGH (ref 11.5–15.5)
WBC: 6.5 10*3/uL (ref 4.0–10.5)
nRBC: 0 % (ref 0.0–0.2)

## 2021-01-29 LAB — MAGNESIUM: Magnesium: 2.3 mg/dL (ref 1.7–2.4)

## 2021-01-29 LAB — PHOSPHORUS: Phosphorus: 4.4 mg/dL (ref 2.5–4.6)

## 2021-01-30 LAB — HCV RNA DIAGNOSIS, NAA
HCV RNA, Quantitation: 22 IU/mL
HCV RNA, log10: 1.342 log10 IU/mL

## 2021-01-30 LAB — TACROLIMUS LEVEL: Tacrolimus (FK506) - LabCorp: 7.9 ng/mL (ref 2.0–20.0)

## 2021-01-30 MED FILL — SULFAMETHOXAZOLE 400 MG-TRIMETHOPRIM 80 MG TABLET: ORAL | 28 days supply | Qty: 12 | Fill #1

## 2021-01-30 MED FILL — ROSUVASTATIN 10 MG TABLET: ORAL | 30 days supply | Qty: 30 | Fill #1

## 2021-01-30 MED FILL — ACYCLOVIR 400 MG TABLET: ORAL | 30 days supply | Qty: 60 | Fill #1

## 2021-01-30 MED FILL — MYCOPHENOLATE SODIUM 180 MG TABLET,DELAYED RELEASE: ORAL | 30 days supply | Qty: 180 | Fill #1

## 2021-01-31 LAB — HLA DS POST TRANSPLANT

## 2021-01-31 LAB — HEPATITIS C GENOTYPE

## 2021-01-31 NOTE — Unmapped (Signed)
Called patient to check on him and see how he is doing.  He says he was experiencing diarrhea throughout night after something he had eaten.  He endorses feeling better now.      Checked with patient about elevated BP's.  He reports BP 146/62 and heart rate 83.  H reports BP today was 175/87.  He denies swelling.  He endorses good urine output.      He has been taking Amlodipine since Sunday.  He states he hasn't seen a change in his BP numbers.      Plan to update Dr Elvera Maria and follow up with patient in a few days.  Verified patient is holding Aspirin currently.    Patient has biopsy scheduled for 11/9.

## 2021-02-05 ENCOUNTER — Ambulatory Visit (INDEPENDENT_AMBULATORY_CARE_PROVIDER_SITE_OTHER): Payer: Medicare Other | Admitting: Orthopedic Surgery

## 2021-02-05 ENCOUNTER — Other Ambulatory Visit (HOSPITAL_COMMUNITY): Admission: RE | Admit: 2021-02-05 | Payer: Medicare Other | Source: Ambulatory Visit

## 2021-02-05 ENCOUNTER — Other Ambulatory Visit (HOSPITAL_COMMUNITY)
Admission: RE | Admit: 2021-02-05 | Discharge: 2021-02-05 | Disposition: A | Discharge status: Home / Self Care | Payer: Medicare Other | Source: Ambulatory Visit | Attending: Pediatric Nephrology | Admitting: Pediatric Nephrology

## 2021-02-05 ENCOUNTER — Encounter: Payer: Self-pay | Admitting: Orthopedic Surgery

## 2021-02-05 DIAGNOSIS — N2889 Other specified disorders of kidney and ureter: Principal | ICD-10-CM

## 2021-02-05 DIAGNOSIS — I151 Hypertension secondary to other renal disorders: Principal | ICD-10-CM

## 2021-02-05 DIAGNOSIS — Z940 Kidney transplant status: Principal | ICD-10-CM

## 2021-02-05 DIAGNOSIS — D899 Disorder involving the immune mechanism, unspecified: Secondary | ICD-10-CM | POA: Insufficient documentation

## 2021-02-05 DIAGNOSIS — Z789 Other specified health status: Secondary | ICD-10-CM | POA: Insufficient documentation

## 2021-02-05 DIAGNOSIS — B259 Cytomegaloviral disease, unspecified: Secondary | ICD-10-CM | POA: Diagnosis not present

## 2021-02-05 DIAGNOSIS — Z9483 Pancreas transplant status: Secondary | ICD-10-CM | POA: Diagnosis not present

## 2021-02-05 DIAGNOSIS — T8789 Other complications of amputation stump: Secondary | ICD-10-CM

## 2021-02-05 DIAGNOSIS — L97919 Non-pressure chronic ulcer of unspecified part of right lower leg with unspecified severity: Secondary | ICD-10-CM | POA: Diagnosis not present

## 2021-02-05 DIAGNOSIS — N390 Urinary tract infection, site not specified: Secondary | ICD-10-CM | POA: Insufficient documentation

## 2021-02-05 DIAGNOSIS — D631 Anemia in chronic kidney disease: Secondary | ICD-10-CM | POA: Diagnosis not present

## 2021-02-05 DIAGNOSIS — E559 Vitamin D deficiency, unspecified: Secondary | ICD-10-CM | POA: Insufficient documentation

## 2021-02-05 DIAGNOSIS — Z89511 Acquired absence of right leg below knee: Secondary | ICD-10-CM | POA: Diagnosis not present

## 2021-02-05 DIAGNOSIS — Z114 Encounter for screening for human immunodeficiency virus [HIV]: Secondary | ICD-10-CM | POA: Insufficient documentation

## 2021-02-05 DIAGNOSIS — Z79899 Other long term (current) drug therapy: Secondary | ICD-10-CM | POA: Diagnosis not present

## 2021-02-05 DIAGNOSIS — E1129 Type 2 diabetes mellitus with other diabetic kidney complication: Secondary | ICD-10-CM | POA: Insufficient documentation

## 2021-02-05 DIAGNOSIS — Z09 Encounter for follow-up examination after completed treatment for conditions other than malignant neoplasm: Secondary | ICD-10-CM | POA: Insufficient documentation

## 2021-02-05 LAB — CBC W/ DIFFERENTIAL
BASOPHILS ABSOLUTE COUNT: 0 10*3/uL (ref 0.0–0.1)
BASOPHILS RELATIVE PERCENT: 0 %
EOSINOPHILS ABSOLUTE COUNT: 0.1 10*3/uL (ref 0.0–0.5)
EOSINOPHILS RELATIVE PERCENT: 1 %
HEMATOCRIT: 29.5 — ABNORMAL LOW (ref 39.0–52.0)
HEMOGLOBIN: 8.9 g/dL — ABNORMAL LOW (ref 13.0–17.0)
IMMATURE CELLS: 1
LYMPHOCYTES ABSOLUTE COUNT: 0.3 10*3/uL — ABNORMAL LOW (ref 0.7–4.0)
LYMPHOCYTES RELATIVE PERCENT: 4 %
MEAN CORPUSCULAR HEMOGLOBIN CONC: 30.2 g/dL (ref 30.0–36.0)
MEAN CORPUSCULAR HEMOGLOBIN: 27.7 pg (ref 26.0–34.01)
MEAN CORPUSCULAR VOLUME: 91.9 fL (ref 80.0–100.01)
MONOCYTES ABSOLUTE COUNT: 0.8 10*3/uL (ref 0.1–1.0)
MONOCYTES RELATIVE PERCENT: 10 %
NEUTROPHILS ABSOLUTE COUNT: 6.6
NEUTROPHILS RELATIVE PERCENT: 84 %
NUCLEATED RED BLOOD CELLS: 0 (ref 0.0–0.2)
PLATELET COUNT: 140 10*3/uL — ABNORMAL LOW (ref 150–400)
RED BLOOD CELL COUNT: 3.21 MIL/uL — ABNORMAL LOW (ref 4.22–5.81)
RED CELL DISTRIBUTION WIDTH: 15.9 % — ABNORMAL HIGH (ref 11.5–15.5)
WHITE BLOOD CELL COUNT: 7.9 10*3/uL (ref 4.0–10.5)

## 2021-02-05 LAB — BASIC METABOLIC PANEL
ANION GAP: 10 (ref 5–15)
Anion gap: 10 (ref 5–15)
BLOOD UREA NITROGEN: 65 mg/dL — ABNORMAL HIGH (ref 6–20)
BUN: 65 mg/dL — ABNORMAL HIGH (ref 6–20)
CALCIUM: 9.1 mg/dL (ref 8.9–10.3)
CHLORIDE: 110 mmol/L (ref 98–111)
CO2: 19 mmol/L — ABNORMAL LOW (ref 22–32)
CO2: 19 mmol/L — ABNORMAL LOW (ref 22–32)
CREATININE: 3.6 mg/dL — ABNORMAL HIGH (ref 0.61–1.24)
Calcium: 9.1 mg/dL (ref 8.9–10.3)
Chloride: 110 mmol/L (ref 98–111)
Creatinine, Ser: 3.6 mg/dL — ABNORMAL HIGH (ref 0.61–1.24)
EGFR CKD-EPI AA MALE: 19 mL/min — ABNORMAL LOW
GFR, Estimated: 19 mL/min — ABNORMAL LOW (ref >60)
GLUCOSE RANDOM: 94 mg/dL (ref 70–99)
Glucose, Bld: 94 mg/dL (ref 70–99)
MAGNESIUM: 2.3 mg/dL (ref 1.7–2.4)
PHOSPHORUS: 4.4 mg/dL (ref 2.5–4.6)
POTASSIUM: 5.4 mmol/L — ABNORMAL HIGH (ref 3.5–5.1)
Potassium: 5.4 mmol/L — ABNORMAL HIGH (ref 3.5–5.1)
SODIUM: 139 mmol/L (ref 135–145)
Sodium: 139 mmol/L (ref 135–145)

## 2021-02-05 LAB — PHOSPHORUS: Phosphorus: 4.4 mg/dL (ref 2.5–4.6)

## 2021-02-05 LAB — CBC WITH DIFFERENTIAL/PLATELET
Abs Immature Granulocytes: 0.04 10*3/uL (ref 0.00–0.07)
Basophils Absolute: 0 10*3/uL (ref 0.0–0.1)
Basophils Relative: 0 %
Eosinophils Absolute: 0.1 10*3/uL (ref 0.0–0.5)
Eosinophils Relative: 1 %
HCT: 29.5 % — ABNORMAL LOW (ref 39.0–52.0)
Hemoglobin: 8.9 g/dL — ABNORMAL LOW (ref 13.0–17.0)
Immature Granulocytes: 1 %
Lymphocytes Relative: 4 %
Lymphs Abs: 0.3 10*3/uL — ABNORMAL LOW (ref 0.7–4.0)
MCH: 27.7 pg (ref 26.0–34.0)
MCHC: 30.2 g/dL (ref 30.0–36.0)
MCV: 91.9 fL (ref 80.0–100.0)
Monocytes Absolute: 0.8 10*3/uL (ref 0.1–1.0)
Monocytes Relative: 10 %
Neutro Abs: 6.6 10*3/uL (ref 1.7–7.7)
Neutrophils Relative %: 84 %
Platelets: 140 10*3/uL — ABNORMAL LOW (ref 150–400)
RBC: 3.21 MIL/uL — ABNORMAL LOW (ref 4.22–5.81)
RDW: 15.9 % — ABNORMAL HIGH (ref 11.5–15.5)
WBC: 7.9 10*3/uL (ref 4.0–10.5)
nRBC: 0 % (ref 0.0–0.2)

## 2021-02-05 LAB — MAGNESIUM: Magnesium: 2.3 mg/dL (ref 1.7–2.4)

## 2021-02-05 MED ORDER — TACROLIMUS XR 1 MG TABLET,EXTENDED RELEASE 24 HR
ORAL_TABLET | Freq: Every day | ORAL | 11 refills | 25 days | Status: CP
Start: 2021-02-05 — End: ?

## 2021-02-05 MED ORDER — CARVEDILOL 25 MG TABLET
ORAL_TABLET | Freq: Two times a day (BID) | ORAL | 11 refills | 30 days | Status: CP
Start: 2021-02-05 — End: 2022-02-05

## 2021-02-05 MED ORDER — CHLORTHALIDONE 25 MG TABLET
ORAL_TABLET | Freq: Every morning | ORAL | 11 refills | 30.00000 days | Status: CP
Start: 2021-02-05 — End: 2022-02-05
  Filled 2021-02-16: qty 60, 30d supply, fill #0

## 2021-02-05 NOTE — Progress Notes (Signed)
Office Visit Note   Patient: Tim Becker           Date of Birth: [birth year]           MRN: [id number] Visit Date: 02/05/2021              Requested by: Leeroy Cha, MD [address],  [place] [address] PCP: Leeroy Cha, MD  Chief Complaint  Patient presents with   Right Leg - Follow-up    HX BKA      HPI: Patient is a 55 year old gentleman right transtibial amputee.  Patient has been wearing a short shrinker underneath the lining.  He feels like the ulcers are improving.  Patient is status post kidney transplant and he states that his creatinine numbers are increasing he states he feels more tired and less energetic with his kidney transplant than he did while on dialysis.  Assessment & Plan: Visit Diagnoses:  1. History of right below knee amputation ([place])   2. Non-pressure ulcer of stump of below knee amputation of right lower extremity ([place])     Plan: Patient will continue with the short shrinker against the skin.  He was given a great a short shrinker.  Patient states he has a kidney biopsy on Wednesday.  Follow-Up Instructions: Return in about 4 weeks (around 03/05/2021).   Ortho Exam  Patient is alert, oriented, no adenopathy, well-dressed, normal affect, normal respiratory effort. Examination patient has decreased callus over the residual limb and fibular head.  There is no redness no cellulitis no odor or drainage.  Imaging: No results found. No images are attached to the encounter.  Labs: Lab Results  Component Value Date   HGBA1C 8.6 (H) 04/16/2016   ESRSEDRATE 74 (H) 04/15/2016   ESRSEDRATE 127 (H) 08/10/2014   CRP 13.3 (H) 04/15/2016   CRP 8.9 (H) 08/10/2014   REPTSTATUS 12/12/2020 FINAL 12/11/2020   GRAMSTAIN  04/16/2016    RARE WBC PRESENT, PREDOMINANTLY PMN RARE GRAM NEGATIVE RODS    CULT  12/11/2020    NO GROWTH Performed at [facility], [place] [address] [place], [place] [address]    LABORGA  METHICILLIN RESISTANT STAPHYLOCOCCUS AUREUS 04/16/2016     Lab Results  Component Value Date   ALBUMIN 3.2 (L) 04/17/2016   ALBUMIN 3.1 (L) 04/16/2016   ALBUMIN 3.0 (L) 04/15/2016    Lab Results  Component Value Date   MG 2.3 02/05/2021   MG 2.3 01/29/2021   MG 2.1 01/22/2021   No results found for: VD25OH  No results found for: PREALBUMIN CBC EXTENDED Latest Ref Rng & Units 02/05/2021 01/29/2021 01/22/2021  WBC 4.0 - 10.5 K/uL 7.9 6.5 7.2  RBC 4.22 - 5.81 MIL/uL 3.21(L) 3.35(L) 3.71(L)  HGB 13.0 - 17.0 g/dL 8.9(L) 9.3(L) 10.2(L)  HCT 39.0 - 52.0 % 29.5(L) 30.5(L) 33.7(L)  PLT 150 - 400 K/uL 140(L) 162 211  NEUTROABS 1.7 - 7.7 K/uL 6.6 5.3 5.8  LYMPHSABS 0.7 - 4.0 K/uL 0.3(L) 0.5(L) 0.5(L)     There is no height or weight on file to calculate BMI.  Orders:  No orders of the defined types were placed in this encounter.  No orders of the defined types were placed in this encounter.    Procedures: No procedures performed  Clinical Data: No additional findings.  ROS:  All other systems negative, except as noted in the HPI. Review of Systems  Objective: Vital Signs: There were no vitals taken for this visit.  Specialty  Comments:  No specialty comments available.  PMFS History: Patient Active Problem List   Diagnosis Date Noted   Severe nonproliferative diabetic retinopathy of right eye ([place]) 09/12/2020   Severe nonproliferative diabetic retinopathy of left eye ([place]) 09/12/2020   Nuclear sclerotic cataract of both eyes 09/12/2020   Charcot's joint, left ankle and foot 10/29/2016   History of right below knee amputation ([place]) 10/29/2016   Diabetic polyneuropathy associated with type 2 diabetes mellitus ([place]) 10/29/2016   ESRD (end stage renal disease) on dialysis ([place]) 04/15/2016   Chronic osteomyelitis of toe of left foot ([place]) 04/15/2016   Left renal mass 07/11/2015   Biliary calculi 09/14/2014   Hemodialysis-associated hypotension 09/13/2014   H/O  amputation of leg through tibia and fibula ([place]) 09/08/2014   Anemia associated with chronic renal failure 09/06/2014   Type 2 diabetes mellitus with diabetic neuropathy ([place]) 08/07/2014   Essential hypertension 08/07/2014   Obesity 08/07/2014   Hypothyroidism 08/07/2014   Kidney lump 04/06/2014   Obstructive apnea 04/06/2014   Diabetes mellitus ([place]) 03/23/2014   Patient awaiting renal transplant 03/23/2014   Asthma, moderate persistent 10/28/2013   Anemia due to blood loss 09/14/2013   Morbid obesity ([place]) 09/11/2013   Morbid (severe) obesity due to excess calories ([place]) 09/11/2013   Chronic kidney disease, stage IV (severe) ([place]) 09/10/2013   Type 2 diabetes mellitus ([place]) 09/10/2013   Mild persistent asthma with acute exacerbation 09/10/2013   Abnormal ECG 05/07/2013   CAD in native artery 05/07/2013   Past Medical History:  Diagnosis Date   Anemia    Arthritis    Cancer ([place])    Renal Tumor   CKD (chronic kidney disease)    Coronary artery disease    Diabetes ([place])    DM (diabetes mellitus) with complications ([place])    ESRD (end stage renal disease) ([place])    Pre- dialysis   Hypertension    Left kidney mass    Neuropathy    Obesity    Osteomyelitis, chronic, ankle or foot ([facility])    PONV (postoperative nausea and vomiting)    Renal insufficiency    Patient is on Dialysis and receives M,W and F.   S/P BKA (below knee amputation) [facility]) June 2016   Right , [place], [place]   Sleep apnea    CPAP   Thyroid disease     Family History  Problem Relation Age of Onset   Diabetes Mother    Kidney disease Mother    Breast cancer Mother     Past Surgical History:  Procedure Laterality Date   BASCILIC VEIN TRANSPOSITION Left 02/17/2015   Procedure: BASCILIC VEIN TRANSPOSITION;  Surgeon: Katha Cabal, MD;  Location: [facility] ORS;  Service: Vascular;  Laterality: Left;   BELOW KNEE LEG AMPUTATION Right    CORONARY ANGIOPLASTY WITH STENT PLACEMENT     EYE SURGERY     FOOT  SURGERY     Multiple R foot surgery for infection and charcot foot   I & D EXTREMITY Left 04/16/2016   Procedure: IRRIGATION AND DEBRIDEMENT EXTREMITY/GREAT TOE AMP.;  Surgeon: Edrick Kins, DPM;  Location: [facility];  Service: Podiatry;  Laterality: Left;   JOINT REPLACEMENT     LAPAROSCOPIC NEPHRECTOMY, HAND ASSISTED Left 07/11/2015   Procedure: HAND ASSISTED LAPAROSCOPIC NEPHRECTOMY;  Surgeon: Hollice Espy, MD;  Location: [facility] ORS;  Service: Urology;  Laterality: Left;   PERIPHERAL VASCULAR CATHETERIZATION Left 12/06/2014   Procedure: A/V Shuntogram/Fistulagram;  Surgeon: Katha Cabal, MD;  Location:  [place] [facility];  Service: Cardiovascular;  Laterality: Left;   PERIPHERAL VASCULAR CATHETERIZATION Left 12/06/2014   Procedure: A/V Shunt Intervention;  Surgeon: Katha Cabal, MD;  Location: [place] [facility];  Service: Cardiovascular;  Laterality: Left;   TONSILLECTOMY     Social History   Occupational History   Not on file  Tobacco Use   Smoking status: Never   Smokeless tobacco: Never  Vaping Use   Vaping Use: Never used  Substance and Sexual Activity   Alcohol use: No   Drug use: No   Sexual activity: Yes

## 2021-02-05 NOTE — Unmapped (Signed)
VIR pre procedure prep call completed. Reviewed to register through main entrance at [facility] then proceed to waiting room.  NPO guidelines reviewed. Pt OK to take sips of clear liquids with all AM meds. COVID screening completed. Pt aware of need for driver >55 years of age. Pt verbalized understanding. All questions answered.    Has not taken ASA since October.  Will hold all AM diabetic medications.

## 2021-02-05 NOTE — Unmapped (Signed)
Spoke with patient who states his current BP is 175/99 this am. He states his BP's have been 170's/80's-90's throughout the week.     He says he had sore throat, lower back pain and low energy yesterday.  He says today he feels better with symptoms improved.  Coordinator recommended rest and fluids and to call if symptoms or fever reoccured.    Taking Amlodipine at night, Cardevilol BID.    Reached out to Dr Elvera Maria and pharmacy for further planning with medications.

## 2021-02-06 DIAGNOSIS — Z940 Kidney transplant status: Principal | ICD-10-CM

## 2021-02-06 LAB — HCV RNA DIAGNOSIS, NAA: HCV RNA, Quantitation: NOT DETECTED IU/mL

## 2021-02-06 NOTE — Unmapped (Signed)
Called to discuss biopsy plan. Recommend direct admission on Wednesday 11/9 after his VIR biopsy for likely steroids while awaiting kidney biopsy result, due to high suspicion for rejection (presumeing UA and ultrasound reassuring against infection or rejection).    Bumped Envarsus to 7 mg daily and chlorthalidone to 50 mg daily.

## 2021-02-06 NOTE — Unmapped (Signed)
See other note

## 2021-02-07 ENCOUNTER — Ambulatory Visit: Admit: 2021-02-07 | Discharge: 2021-02-15 | Payer: MEDICARE

## 2021-02-07 ENCOUNTER — Ambulatory Visit
Admit: 2021-02-07 | Discharge: 2021-02-15 | Disposition: A | Discharge status: Home Health | Payer: MEDICARE | Admitting: Nephrology

## 2021-02-07 ENCOUNTER — Encounter: Admit: 2021-02-07 | Payer: MEDICARE | Attending: Nephrology

## 2021-02-07 ENCOUNTER — Ambulatory Visit: Admit: 2021-02-07 | Payer: MEDICARE

## 2021-02-07 DIAGNOSIS — R0602 Shortness of breath: Secondary | ICD-10-CM | POA: Diagnosis not present

## 2021-02-07 DIAGNOSIS — Z905 Acquired absence of kidney: Secondary | ICD-10-CM | POA: Diagnosis not present

## 2021-02-07 DIAGNOSIS — J810 Acute pulmonary edema: Secondary | ICD-10-CM | POA: Diagnosis not present

## 2021-02-07 DIAGNOSIS — T8619 Other complication of kidney transplant: Secondary | ICD-10-CM | POA: Diagnosis not present

## 2021-02-07 DIAGNOSIS — R944 Abnormal results of kidney function studies: Secondary | ICD-10-CM | POA: Diagnosis not present

## 2021-02-07 DIAGNOSIS — Z794 Long term (current) use of insulin: Secondary | ICD-10-CM | POA: Diagnosis not present

## 2021-02-07 DIAGNOSIS — Z9989 Dependence on other enabling machines and devices: Secondary | ICD-10-CM | POA: Diagnosis not present

## 2021-02-07 DIAGNOSIS — E1142 Type 2 diabetes mellitus with diabetic polyneuropathy: Secondary | ICD-10-CM | POA: Diagnosis not present

## 2021-02-07 DIAGNOSIS — N179 Acute kidney failure, unspecified: Secondary | ICD-10-CM | POA: Diagnosis not present

## 2021-02-07 DIAGNOSIS — J309 Allergic rhinitis, unspecified: Secondary | ICD-10-CM | POA: Diagnosis not present

## 2021-02-07 DIAGNOSIS — R7989 Other specified abnormal findings of blood chemistry: Secondary | ICD-10-CM | POA: Diagnosis not present

## 2021-02-07 DIAGNOSIS — I1311 Hypertensive heart and chronic kidney disease without heart failure, with stage 5 chronic kidney disease, or end stage renal disease: Secondary | ICD-10-CM | POA: Diagnosis not present

## 2021-02-07 DIAGNOSIS — Z5181 Encounter for therapeutic drug level monitoring: Secondary | ICD-10-CM | POA: Diagnosis not present

## 2021-02-07 DIAGNOSIS — J9601 Acute respiratory failure with hypoxia: Secondary | ICD-10-CM | POA: Diagnosis not present

## 2021-02-07 DIAGNOSIS — E119 Type 2 diabetes mellitus without complications: Secondary | ICD-10-CM | POA: Diagnosis not present

## 2021-02-07 DIAGNOSIS — E1122 Type 2 diabetes mellitus with diabetic chronic kidney disease: Secondary | ICD-10-CM | POA: Diagnosis not present

## 2021-02-07 DIAGNOSIS — R918 Other nonspecific abnormal finding of lung field: Secondary | ICD-10-CM | POA: Diagnosis not present

## 2021-02-07 DIAGNOSIS — E8770 Fluid overload, unspecified: Secondary | ICD-10-CM | POA: Diagnosis not present

## 2021-02-07 DIAGNOSIS — I2510 Atherosclerotic heart disease of native coronary artery without angina pectoris: Secondary | ICD-10-CM | POA: Diagnosis not present

## 2021-02-07 DIAGNOSIS — Z89511 Acquired absence of right leg below knee: Secondary | ICD-10-CM | POA: Diagnosis not present

## 2021-02-07 DIAGNOSIS — Z7982 Long term (current) use of aspirin: Secondary | ICD-10-CM | POA: Diagnosis not present

## 2021-02-07 DIAGNOSIS — I701 Atherosclerosis of renal artery: Secondary | ICD-10-CM | POA: Diagnosis not present

## 2021-02-07 DIAGNOSIS — Z20822 Contact with and (suspected) exposure to covid-19: Secondary | ICD-10-CM | POA: Diagnosis not present

## 2021-02-07 DIAGNOSIS — J811 Chronic pulmonary edema: Secondary | ICD-10-CM | POA: Diagnosis not present

## 2021-02-07 DIAGNOSIS — I10 Essential (primary) hypertension: Secondary | ICD-10-CM | POA: Diagnosis not present

## 2021-02-07 DIAGNOSIS — D849 Immunodeficiency, unspecified: Secondary | ICD-10-CM | POA: Diagnosis not present

## 2021-02-07 DIAGNOSIS — Z79899 Other long term (current) drug therapy: Secondary | ICD-10-CM | POA: Diagnosis not present

## 2021-02-07 DIAGNOSIS — T8611 Kidney transplant rejection: Secondary | ICD-10-CM | POA: Diagnosis not present

## 2021-02-07 DIAGNOSIS — J90 Pleural effusion, not elsewhere classified: Secondary | ICD-10-CM | POA: Diagnosis not present

## 2021-02-07 DIAGNOSIS — Z89412 Acquired absence of left great toe: Secondary | ICD-10-CM | POA: Diagnosis not present

## 2021-02-07 DIAGNOSIS — Z992 Dependence on renal dialysis: Secondary | ICD-10-CM | POA: Diagnosis not present

## 2021-02-07 DIAGNOSIS — E039 Hypothyroidism, unspecified: Secondary | ICD-10-CM | POA: Diagnosis not present

## 2021-02-07 DIAGNOSIS — Z940 Kidney transplant status: Secondary | ICD-10-CM | POA: Diagnosis not present

## 2021-02-07 DIAGNOSIS — Z888 Allergy status to other drugs, medicaments and biological substances status: Secondary | ICD-10-CM | POA: Diagnosis not present

## 2021-02-07 DIAGNOSIS — G4733 Obstructive sleep apnea (adult) (pediatric): Secondary | ICD-10-CM | POA: Diagnosis not present

## 2021-02-07 LAB — BASIC METABOLIC PANEL
ANION GAP: 9 mmol/L (ref 5–14)
BLOOD UREA NITROGEN: 49 mg/dL — ABNORMAL HIGH (ref 9–23)
BUN / CREAT RATIO: 14
CALCIUM: 9 mg/dL (ref 8.7–10.4)
CHLORIDE: 112 mmol/L — ABNORMAL HIGH (ref 98–107)
CO2: 19 mmol/L — ABNORMAL LOW (ref 20.0–31.0)
CREATININE: 3.38 mg/dL — ABNORMAL HIGH
EGFR CKD-EPI (2021) MALE: 21 mL/min/{1.73_m2} — ABNORMAL LOW (ref >=60)
GLUCOSE RANDOM: 175 mg/dL (ref 70–179)
POTASSIUM: 5.8 mmol/L — ABNORMAL HIGH (ref 3.4–4.8)
SODIUM: 140 mmol/L (ref 135–145)

## 2021-02-07 LAB — CBC W/ AUTO DIFF
BASOPHILS ABSOLUTE COUNT: 0 10*9/L (ref 0.0–0.1)
BASOPHILS RELATIVE PERCENT: 0.2 %
EOSINOPHILS ABSOLUTE COUNT: 0.1 10*9/L (ref 0.0–0.5)
EOSINOPHILS RELATIVE PERCENT: 1.6 %
HEMATOCRIT: 28.7 % — ABNORMAL LOW (ref 39.0–48.0)
HEMOGLOBIN: 9.3 g/dL — ABNORMAL LOW (ref 12.9–16.5)
LYMPHOCYTES ABSOLUTE COUNT: 0.2 10*9/L — ABNORMAL LOW (ref 1.1–3.6)
LYMPHOCYTES RELATIVE PERCENT: 3.9 %
MEAN CORPUSCULAR HEMOGLOBIN CONC: 32.4 g/dL (ref 32.0–36.0)
MEAN CORPUSCULAR HEMOGLOBIN: 28.1 pg (ref 25.9–32.4)
MEAN CORPUSCULAR VOLUME: 86.7 fL (ref 77.6–95.7)
MEAN PLATELET VOLUME: 9.2 fL (ref 6.8–10.7)
MONOCYTES ABSOLUTE COUNT: 0.5 10*9/L (ref 0.3–0.8)
MONOCYTES RELATIVE PERCENT: 8 %
NEUTROPHILS ABSOLUTE COUNT: 5.3 10*9/L (ref 1.8–7.8)
NEUTROPHILS RELATIVE PERCENT: 86.3 %
PLATELET COUNT: 143 10*9/L — ABNORMAL LOW (ref 150–450)
RED BLOOD CELL COUNT: 3.31 10*12/L — ABNORMAL LOW (ref 4.26–5.60)
RED CELL DISTRIBUTION WIDTH: 17 % — ABNORMAL HIGH (ref 12.2–15.2)
WBC ADJUSTED: 6.2 10*9/L (ref 3.6–11.2)

## 2021-02-07 LAB — URINALYSIS
BACTERIA: NONE SEEN /HPF
BILIRUBIN UA: NEGATIVE
GLUCOSE UA: NEGATIVE
KETONES UA: NEGATIVE
LEUKOCYTE ESTERASE UA: NEGATIVE
NITRITE UA: NEGATIVE
PH UA: 5 (ref 5.0–9.0)
PROTEIN UA: NEGATIVE
RBC UA: 36 /HPF — ABNORMAL HIGH (ref <=3)
SPECIFIC GRAVITY UA: 1.008 (ref 1.003–1.030)
SQUAMOUS EPITHELIAL: <1 /HPF (ref 0–5)
UROBILINOGEN UA: <2
WBC UA: 4 /HPF — ABNORMAL HIGH (ref <=2)

## 2021-02-07 LAB — CBC
HEMATOCRIT: 27.5 % — ABNORMAL LOW (ref 39.0–48.0)
HEMOGLOBIN: 8.8 g/dL — ABNORMAL LOW (ref 12.9–16.5)
MEAN CORPUSCULAR HEMOGLOBIN CONC: 32.1 g/dL (ref 32.0–36.0)
MEAN CORPUSCULAR HEMOGLOBIN: 27.8 pg (ref 25.9–32.4)
MEAN CORPUSCULAR VOLUME: 86.5 fL (ref 77.6–95.7)
MEAN PLATELET VOLUME: 9 fL (ref 6.8–10.7)
PLATELET COUNT: 154 10*9/L (ref 150–450)
RED BLOOD CELL COUNT: 3.18 10*12/L — ABNORMAL LOW (ref 4.26–5.60)
RED CELL DISTRIBUTION WIDTH: 17 % — ABNORMAL HIGH (ref 12.2–15.2)
WBC ADJUSTED: 8 10*9/L (ref 3.6–11.2)

## 2021-02-07 LAB — BLOOD GAS CRITICAL CARE PANEL, VENOUS
BASE EXCESS VENOUS: -6.8 — ABNORMAL LOW (ref -2.0–2.0)
CALCIUM IONIZED VENOUS (MG/DL): 4.56 mg/dL (ref 4.40–5.40)
GLUCOSE WHOLE BLOOD: 181 mg/dL — ABNORMAL HIGH (ref 70–179)
HCO3 VENOUS: 18 mmol/L — ABNORMAL LOW (ref 22–27)
HEMOGLOBIN BLOOD GAS: 9.1 g/dL — ABNORMAL LOW
LACTATE BLOOD VENOUS: 0.8 mmol/L (ref 0.5–1.8)
O2 SATURATION VENOUS: 85 % (ref 40.0–85.0)
PCO2 VENOUS: 34 mmHg — ABNORMAL LOW (ref 40–60)
PH VENOUS: 7.34 (ref 7.32–7.43)
PO2 VENOUS: 54 mmHg (ref 30–55)
POTASSIUM WHOLE BLOOD: 5.5 mmol/L — ABNORMAL HIGH (ref 3.4–4.6)
SODIUM WHOLE BLOOD: 133 mmol/L — ABNORMAL LOW (ref 135–145)

## 2021-02-07 LAB — PROTIME-INR
INR: 1.12
PROTIME: 12.7 s (ref 9.8–12.8)

## 2021-02-07 LAB — HEPATITIS C GENOTYPE

## 2021-02-07 LAB — TACROLIMUS LEVEL: Tacrolimus (FK506) - LabCorp: 6.4 ng/mL (ref 2.0–20.0)

## 2021-02-07 MED ORDER — ATORVASTATIN 10 MG TABLET
ORAL_TABLET | Freq: Every day | ORAL | 0 refills | 30 days | Status: CP
Start: 2021-02-07 — End: 2021-03-09
  Filled 2021-02-14: qty 30, 30d supply, fill #0

## 2021-02-07 MED ADMIN — lidocaine (XYLOCAINE) 10 mg/mL (1 %) injection: INTRADERMAL | @ 15:00:00 | Stop: 2021-02-07

## 2021-02-07 MED ADMIN — fentaNYL (PF) (SUBLIMAZE) injection: INTRAVENOUS | @ 15:00:00 | Stop: 2021-02-07

## 2021-02-07 MED ADMIN — sodium chloride (NS) 0.9 % infusion: INTRAVENOUS | @ 15:00:00 | Stop: 2021-02-07

## 2021-02-07 MED ADMIN — midazolam (VERSED) injection: INTRAVENOUS | @ 15:00:00 | Stop: 2021-02-07

## 2021-02-07 MED ADMIN — gelatin sponge,absorb-porcine (GELFOAM) sponge: @ 15:00:00 | Stop: 2021-02-07

## 2021-02-07 MED FILL — MAVYRET 100 MG-40 MG TABLET: ORAL | 28 days supply | Qty: 1 | Fill #1

## 2021-02-07 NOTE — Unmapped (Addendum)
I told Mr. Hayze that the [facility] does NOT supply Mavyret and his wife MUST bring his supply of Mavyret at home  to the hospital in case he gets admitted tonight.    [facility] Clinical Assessment & Refill Coordination Note    Niranjan Rufener, DOB: 1966-02-08  Phone: [phone] (home)     All above HIPAA information was verified with patient.     Was a Nurse, learning disability used for this call? No    Specialty Medication(s):   Infectious Disease: Mavyret     No current facility-administered medications for this visit.     No current outpatient medications on file.        Changes to medications: Duvan reports no changes at this time.    Allergies   Allergen Reactions   ??? Ferric Citrate Nausea And Vomiting       Changes to allergies: No    SPECIALTY MEDICATION ADHERENCE     Mavyret 100-40 mg: 7 days of medicine on hand     Medication Adherence    Patient reported X missed doses in the last month: 0  Specialty Medication: Mavyret 100-40mg   Patient is on additional specialty medications: No  Demonstrates understanding of importance of adherence: yes  Informant: patient  Provider-estimated medication adherence level: good  Patient is at risk for Non-Adherence: No          Specialty medication(s) dose(s) confirmed: Regimen is correct and unchanged.     Are there any concerns with adherence? No    Adherence counseling provided? Not needed    CLINICAL MANAGEMENT AND INTERVENTION      Clinical Benefit Assessment:    Do you feel the medicine is effective or helping your condition? Yes    Clinical Benefit counseling provided? Not needed    Adverse Effects Assessment:    Are you experiencing any side effects? Yes he is experiencing fatigue.      Are you experiencing difficulty administering your medicine? No    Quality of Life Assessment:    How many days over the past month did your Hepatitis C  keep you from your normal activities? For example, brushing your teeth or getting up in the morning. 0    Have you discussed this with your provider? Not needed    Acute Infection Status:    Acute infections noted within Epic:  No active infections  Patient reported infection: None    Therapy Appropriateness:    Is therapy appropriate and patient progressing towards therapeutic goals? Yes, therapy is appropriate and should be continued    DISEASE/MEDICATION-SPECIFIC INFORMATION      For Hepatitis C patients (clinical assessment):  Regimen: Mavyret x 8 weeks  Therapy start date: 01/18/21  Completed Treatment Week #: 3  What time of day do you take your medicine? unknown  Pill count over the phone revealed #7 days or 21 tablets which is appropriate.    Are you taking any new OTC or herbal medication? No  Any alcoholic beverages? No  Do you have a follow up appointment? No, I gave him the number of [facility], [facility] [phone]    PATIENT SPECIFIC NEEDS     - Does the patient have any physical, cognitive, or cultural barriers? No    - Is the patient high risk? Yes, patient is taking a REMS drug. Medication is dispensed in compliance with REMS program Not dispensing REMS drug with this order.    - Does the patient require a  Care Management Plan? No     - Does the patient require physician intervention or other additional services (i.e. nutrition, smoking cessation, social work)? No      SHIPPING     Specialty Medication(s) to be Shipped:   Infectious Disease: Mavyret    Other medication(s) to be shipped: No additional medications requested for fill at this time     Changes to insurance: No    Delivery Scheduled: Yes, Expected medication delivery date: 02/08/21.     Medication will be delivered via UPS to the confirmed prescription address in [facility].    The patient will receive a drug information handout for each medication shipped and additional FDA Medication Guides as required.  Verified that patient has previously received a Conservation officer, historic buildings and a Surveyor, mining.    The patient or caregiver noted above participated in the development of this care plan and knows that they can request review of or adjustments to the care plan at any time.      All of the patient's questions and concerns have been addressed.    Roderic Palau   [facility] Specialty Pharmacist

## 2021-02-07 NOTE — Unmapped (Cosign Needed)
[place] INTERVENTIONAL RADIOLOGY - Pre Procedure H/P      Assessment/Plan:    Nicholas Strong is a 55 y.o. male who will undergo nontargeted transplant renal cortical biopsy in Interventional Radiology.    --This procedure has been fully reviewed with the patient/patient???s authorized representative. The risks, benefits and alternatives have been explained, and the patient/patient???s authorized representative has consented to the procedure.  --The patient will accept blood products in an emergent situation.  --The patient does not have a Do Not Resuscitate order in effect.      HPI: Nicholas Strong is a 55 y.o. male PMH RCC s/p L nephrectomy, CAD s/p PCI, DM, HTN and ESRD s/p RLQ renal transplant 12/04/20 now with rising Cr of unclear etiology, concerning for rejection. Patient presents for nontargeted renal cortical biopsy. Transplant nephrology planning for direct admission post procedure.     Allergies:   Allergies   Allergen Reactions   ??? Ferric Citrate Nausea And Vomiting       Medications:  No relevant medications, please see full medication list in Epic.    ASA Grade: ASA 3 - Patient with moderate systemic disease with functional limitations    PE:    Vitals:    02/07/21 0825   BP: 187/71   Pulse: 88   Resp: 22   Temp: 36.3 ??C (97.3 ??F)   SpO2: 99%     General: male in NAD.  Airway assessment: Class 2 - Can visualize soft palate and fauces, tip of uvula is obscured  Lungs: Respirations nonlabored

## 2021-02-07 NOTE — Unmapped (Signed)
[place] INTERVENTIONAL RADIOLOGY - Operative Note     VIR Post-Procedure Note    Procedure Name: percutaneous renal biopsy of the RLQ transplant    Pre-Op Diagnosis: concern for rejection    Post-Op Diagnosis: Same as pre-operative diagnosis    VIR Providers    Operator: Jake Bathe    Time out: Prior to the procedure, a time out was performed with all team members present. During the time out, the patient, procedure and procedure site when applicable were verbally verified by the team members and Jake Bathe.    Description of procedure: Successful biopsy of the RLQ transplant without evidence of perinephric hematoma    Sedation:Moderate sedation    Estimated Blood Loss: approximately 2 mL  Complications: None    See detailed procedure note with images in PACS [facility]).    The patient tolerated the procedure well without incident or complication and left the room in stable condition.    Jake Bathe  02/07/2021 11:03 AM

## 2021-02-08 LAB — CBC
HEMATOCRIT: 28.2 % — ABNORMAL LOW (ref 39.0–48.0)
HEMOGLOBIN: 9 g/dL — ABNORMAL LOW (ref 12.9–16.5)
MEAN CORPUSCULAR HEMOGLOBIN CONC: 32 g/dL (ref 32.0–36.0)
MEAN CORPUSCULAR HEMOGLOBIN: 27.4 pg (ref 25.9–32.4)
MEAN CORPUSCULAR VOLUME: 85.8 fL (ref 77.6–95.7)
MEAN PLATELET VOLUME: 9.4 fL (ref 6.8–10.7)
PLATELET COUNT: 153 10*9/L (ref 150–450)
RED BLOOD CELL COUNT: 3.29 10*12/L — ABNORMAL LOW (ref 4.26–5.60)
RED CELL DISTRIBUTION WIDTH: 17 % — ABNORMAL HIGH (ref 12.2–15.2)
WBC ADJUSTED: 7.4 10*9/L (ref 3.6–11.2)

## 2021-02-08 LAB — BASIC METABOLIC PANEL
ANION GAP: 11 mmol/L (ref 5–14)
ANION GAP: 9 mmol/L (ref 5–14)
BLOOD UREA NITROGEN: 53 mg/dL — ABNORMAL HIGH (ref 9–23)
BLOOD UREA NITROGEN: 63 mg/dL — ABNORMAL HIGH (ref 9–23)
BUN / CREAT RATIO: 16
BUN / CREAT RATIO: 17
CALCIUM: 9.2 mg/dL (ref 8.7–10.4)
CALCIUM: 9.4 mg/dL (ref 8.7–10.4)
CHLORIDE: 112 mmol/L — ABNORMAL HIGH (ref 98–107)
CHLORIDE: 108 mmol/L — ABNORMAL HIGH (ref 98–107)
CO2: 16 mmol/L — ABNORMAL LOW (ref 20.0–31.0)
CO2: 20 mmol/L (ref 20.0–31.0)
CREATININE: 3.38 mg/dL — ABNORMAL HIGH
CREATININE: 3.76 mg/dL — ABNORMAL HIGH
EGFR CKD-EPI (2021) MALE: 21 mL/min/{1.73_m2} — ABNORMAL LOW (ref >=60)
EGFR CKD-EPI (2021) MALE: 18 mL/min/{1.73_m2} — ABNORMAL LOW (ref >=60)
GLUCOSE RANDOM: 193 mg/dL — ABNORMAL HIGH (ref 70–179)
GLUCOSE RANDOM: 323 mg/dL — ABNORMAL HIGH (ref 70–179)
POTASSIUM: 5.8 mmol/L — ABNORMAL HIGH (ref 3.4–4.8)
POTASSIUM: 5.5 mmol/L — ABNORMAL HIGH (ref 3.4–4.8)
SODIUM: 139 mmol/L (ref 135–145)
SODIUM: 137 mmol/L (ref 135–145)

## 2021-02-08 LAB — TACROLIMUS LEVEL, TROUGH: TACROLIMUS, TROUGH: 6.9 ng/mL (ref 5.0–15.0)

## 2021-02-08 LAB — MAGNESIUM: MAGNESIUM: 2.1 mg/dL (ref 1.6–2.6)

## 2021-02-08 LAB — PHOSPHORUS: PHOSPHORUS: 3.5 mg/dL (ref 2.4–5.1)

## 2021-02-08 MED ADMIN — insulin lispro (HumaLOG) injection 0-20 Units: 0-20 [IU] | SUBCUTANEOUS | @ 01:00:00

## 2021-02-08 MED ADMIN — heparin (porcine) 5,000 unit/mL injection 7,500 Units: 7500 [IU] | SUBCUTANEOUS | Stop: 2021-02-07

## 2021-02-08 MED ADMIN — furosemide (LASIX) injection 40 mg: 40 mg | INTRAVENOUS | @ 14:00:00 | Stop: 2021-02-08

## 2021-02-08 MED ADMIN — sulfamethoxazole-trimethoprim (BACTRIM) 400-80 mg tablet 80 mg of trimethoprim: 1 | ORAL | Stop: 2021-02-21

## 2021-02-08 MED ADMIN — mycophenolate (MYFORTIC) EC tablet 540 mg: 540 mg | ORAL | @ 01:00:00

## 2021-02-08 MED ADMIN — acyclovir (ZOVIRAX) capsule 400 mg: 400 mg | ORAL | @ 14:00:00

## 2021-02-08 MED ADMIN — acyclovir (ZOVIRAX) capsule 400 mg: 400 mg | ORAL | @ 01:00:00

## 2021-02-08 MED ADMIN — furosemide (LASIX) injection 40 mg: 40 mg | INTRAVENOUS

## 2021-02-08 MED ADMIN — tacrolimus (ENVARSUS XR) extended release tablet 7 mg: 7 mg | ORAL | @ 14:00:00

## 2021-02-08 MED ADMIN — pravastatin (PRAVACHOL) tablet 80 mg: 80 mg | ORAL | @ 14:00:00 | Stop: 2021-02-08

## 2021-02-08 MED ADMIN — furosemide (LASIX) injection 40 mg: 40 mg | INTRAVENOUS | @ 19:00:00

## 2021-02-08 MED ADMIN — methylPREDNISolone sodium succinate (PF) (Solu-MEDROL) 500 mg in sodium chloride (NS) 0.9 % 50 mL IVPB: 500 mg | INTRAVENOUS | @ 03:00:00 | Stop: 2021-02-10

## 2021-02-08 MED ADMIN — magnesium oxide-Mg AA chelate (Magnesium Plus Protein) 1 tablet: 1 | ORAL | @ 01:00:00

## 2021-02-08 MED ADMIN — insulin glargine (LANTUS) injection 25 Units: 25 [IU] | SUBCUTANEOUS | @ 01:00:00

## 2021-02-08 MED ADMIN — mycophenolate (MYFORTIC) EC tablet 540 mg: 540 mg | ORAL | @ 14:00:00

## 2021-02-08 MED ADMIN — glecaprevir-pibrentasvir (MAVYRET) tablet 3 tablet **Patient Supplied**: 3 | ORAL | @ 19:00:00

## 2021-02-08 MED ADMIN — levothyroxine (SYNTHROID) tablet 75 mcg: 75 ug | ORAL | @ 11:00:00

## 2021-02-08 MED ADMIN — insulin lispro (HumaLOG) injection 0-20 Units: 0-20 [IU] | SUBCUTANEOUS | @ 15:00:00

## 2021-02-08 MED ADMIN — insulin lispro (HumaLOG) injection 0-20 Units: 0-20 [IU] | SUBCUTANEOUS | @ 23:00:00

## 2021-02-08 MED ADMIN — carvediloL (COREG) tablet 25 mg: 25 mg | ORAL | @ 14:00:00

## 2021-02-08 MED ADMIN — insulin lispro (HumaLOG) injection 3 Units: 3 [IU] | SUBCUTANEOUS | @ 01:00:00

## 2021-02-08 MED ADMIN — carvediloL (COREG) tablet 25 mg: 25 mg | ORAL | @ 01:00:00

## 2021-02-08 MED ADMIN — magnesium oxide-Mg AA chelate (Magnesium Plus Protein) 1 tablet: 1 | ORAL | @ 14:00:00

## 2021-02-08 MED ADMIN — atorvastatin (LIPITOR) tablet 20 mg: 20 mg | ORAL | @ 01:00:00

## 2021-02-08 MED ADMIN — amLODIPine (NORVASC) tablet 10 mg: 10 mg | ORAL | @ 14:00:00

## 2021-02-08 MED ADMIN — insulin lispro (HumaLOG) injection 3 Units: 3 [IU] | SUBCUTANEOUS | @ 15:00:00 | Stop: 2021-02-08

## 2021-02-08 MED ADMIN — insulin lispro (HumaLOG) injection 0-20 Units: 0-20 [IU] | SUBCUTANEOUS | @ 19:00:00

## 2021-02-08 MED ADMIN — methylPREDNISolone sodium succinate (PF) (Solu-MEDROL) 500 mg in sodium chloride (NS) 0.9 % 50 mL IVPB: 500 mg | INTRAVENOUS | @ 14:00:00 | Stop: 2021-02-10

## 2021-02-08 MED ADMIN — chlorthalidone (HYGROTON) tablet 50 mg: 50 mg | ORAL | @ 14:00:00

## 2021-02-08 MED ADMIN — insulin lispro (HumaLOG) injection 6 Units: 6 [IU] | SUBCUTANEOUS | @ 23:00:00

## 2021-02-08 NOTE — Unmapped (Signed)
Tacrolimus Therapeutic Monitoring Pharmacy Note    Nicholas Strong is a 55 y.o. male continuing tacrolimus.     Indication: Kidney transplant     Date of Transplant: 12/04/20      Prior Dosing Information: Home regimen Envarsus 7 mg daily     Goals:  Therapeutic Drug Levels  Tacrolimus trough goal: 8-10 ng/mL    Additional Clinical Monitoring/Outcomes  ?? Monitor renal function (SCr and urine output) and liver function (LFTs)  ?? Monitor for signs/symptoms of adverse events (e.g., hyperglycemia, hyperkalemia, hypomagnesemia, hypertension, headache, tremor)    Results:   Tacrolimus level: Not applicable    Pharmacokinetic Considerations and Significant Drug Interactions:  ??? Concurrent hepatotoxic medications: None identified  ??? Concurrent CYP3A4 substrates/inhibitors: None identified  ??? Concurrent nephrotoxic medications: None identified    Assessment/Plan:  Recommendedation(s)  ??? Continue current regimen of Envarsus 7 mg daily    Follow-up  ??? Next level has been ordered on 02/08/21 at 0600.   ??? A pharmacist will continue to monitor and recommend levels as appropriate    Please page service pharmacist with questions/clarifications.    Kennis Carina, PharmD

## 2021-02-08 NOTE — Unmapped (Signed)
Nephrology (MEDB) History & Physical    Assessment & Plan:   Nicholas Strong is a 55 y.o. male with PMHx of??ESRD 2/2 DM2 s/p DDKT (11/2020), RCC s/p Left nephrectomy, HTN,  Polyneuropathy,??Renal Stone,??and   kidney allotransplant on 12/04/2020 that presented to [facility] with rising creatinine (3.6 from nadir 2.3) and worsening hypertension concerning for rejection.     Active Problems:    * No active hospital problems. *  Resolved Problems:    * No resolved hospital problems. *    C/f Acute Rejection I S/p DDKT 12/04/20    Outpatient nephrologist Dr. Elvera Maria.  Patient initially presented for renal biopsy following rising creatinine and worsening hypertension in outpatient clinic.  Underwent DD KT 11/2020.  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 Post transplant nadir creatinine 2.3, now has risen to 3.6 on 02/07/21.  Kidney biopsy performed 02/07/2021.   - 500mg  solumedrol for 3 days (11/9 - 11/11)   - follow-up renal biopsy to assess for need for IVIG vs thymoglobulin/ plasmapharesis  - follow-up CMV, EBV levels  - Continue home envarsus 7mg  daily; pharmacy to dose  - Continue home myocophenolate 540mg  BID  - Continue home bactrim MWF, acyclovir 400mg  BID  - Continue IV lasix 40mg  daily in place of home lasix 80mg  daily    Acute Hypoxic Respiratory Failure  Patient initially presenting to unit requiring 2 L O2 via [place].  Patient reports he was advised to hold his home standing Lasix 80 mg daily for 1 week prior to renal biopsy.  Chest x-ray on presentation notable for bilateral pleural effusions concerning for pulmonary edema.  As a result, started on IV Lasix 40 daily.  - Continue diuresis as above  - Wean O2 as tolerated to SpO2 >90      Chronic Conditions  Allergic Rhinitis: Continue home cetirizine, PRN afrin  Hypertension: Continue home carvedilol 25mg  twice daily, chlorthalidone 25mg  daily, and amlodipine 10mg  daily  DM2: Home basaglar 50 QAM decreased to 25 U at bedtime on admission; 3U ACHS, SSI  Hypothyroidism: Continue synthroid 75mg  daily  CAD: Continue atorvastatin 20mg  daily in place of home rosuvastatin 10mg  daily; ASA 81mg  daily held in setting of renal biopsy as above; resume as tolerated  Hepatitis C: Continue home Mavyret (patient provided)  OSA: Continue home CPAP      Daily Checklist:  Diet: Carb Consistent Diet  DVT PPx: Lovenox 40mg  q24h  Electrolytes: Potassium and Magnesium Repleted  Code Status: Full Code    Chief Concern:   No Principal Problem: There is no principal problem currently on the Problem List. Please update the Problem List and refresh.    Subjective:   HPI:  Nicholas Strong is a 55 y.o. male with ESRD 2/2 DM2 s/p DDKT (11/2020), RCC s/p Left nephrectomy, HTN,  Polyneuropathy,??Renal Stone,??and   kidney allotransplant on 12/04/2020 that presented to [facility] with rising creatinine (3.6 from nadir 2.3) and worsening hypertension concerning for rejection.     Patient underwent renal transplant 11/2020 due to ESRD in setting of diabetes, hypertension and prior nephrectomy secondary to renal cell carcinoma.  Since transplant, patient has been noted to have progressively rising creatinine and worsening hypertension requiring rapid up titration of antihypertensives.  Reports strict adherence to immunosuppressive regimen as well as prophylactic Valtrex and Bactrim.  As result, patient advised to undergo VIR guided renal biopsy to evaluate for renal transplant rejection.    Upon presentation emergency, patient is afebrile and normotensive.  Prebiopsy hemoglobin  8.8.  Underwent biopsy without complications.  Post transplant hemoglobin 9.3.  As result admitted for further evaluation of suspected renal transplant rejection.    Designated Environmental health practitioner:  Nicholas Strong currently has decisional capacity for healthcare decision-making and is able to designate a surrogate healthcare decision maker. Nicholas Strong designated healthcare decision maker(s) is/are Larene Pickett (the patient's spouse) as denoted by stated patient preference.    Allergies:  Ferric citrate    Medications:   Prior to Admission medications    Medication Dose, Route, Frequency   acyclovir (ZOVIRAX) 400 MG tablet 400 mg, Oral, 2 times a day (standard)   amLODIPine (NORVASC) 10 MG tablet 10 mg, Oral, Daily (standard)   carvediloL (COREG) 25 MG tablet 25 mg, Oral, 2 times a day (standard)   cetirizine (ZYRTEC) 10 MG tablet 10 mg, Oral, Daily (standard)   chlorthalidone (HYGROTON) 25 MG tablet 50 mg, Oral, Every morning   diphenhydrAMINE (BENADRYL) 25 mg capsule 25 mg, Oral, Nightly PRN   glecaprevir-pibrentasvir (MAVYRET) 100-40 mg tablet 3 tablets, Oral, Daily (standard), Take with food.   insulin glargine (BASAGLAR, LANTUS) 100 unit/mL (3 mL) injection pen 50 Units, Subcutaneous, Nightly   insulin lispro (HUMALOG) 100 unit/mL injection pen Inject 3 units with breakfast and lunch. Plus sliding scale insulin TID with meals (2 units for every 50 > 150). Max daily dose: 42 units   levothyroxine (SYNTHROID) 75 MCG tablet 75 mcg, Oral, Daily (standard)   magnesium oxide-Mg AA chelate (MAGNESIUM, AMINO ACID CHELATE,) 133 mg 1 tablet, Oral, 2 times a day (standard)   melatonin 5 mg tablet Oral, Nightly PRN   mycophenolate (MYFORTIC) 180 MG EC tablet 540 mg, Oral, 2 times a day (standard), Adjust dose per medication card.   nitroglycerin (NITROSTAT) 0.4 MG SL tablet 0.4 mg, Sublingual, Continuous PRN, Maximum of 3 doses in 15 minutes.    rosuvastatin (CRESTOR) 10 MG tablet 10 mg, Oral, Daily (standard)   semaglutide (OZEMPIC) 1 mg/dose (4 mg/3 mL) PnIj injection 1 mg, Subcutaneous, Every 7 days   sulfamethoxazole-trimethoprim (BACTRIM) 400-80 mg per tablet 1 tablet, Oral, 3 times weekly   tacrolimus (ENVARSUS XR) 1 mg Tb24 extended release tablet 7 mg, Oral, Daily (standard)   acetaminophen (TYLENOL) 500 MG tablet 500-1,000 mg, Oral, Every 6 hours PRN   aspirin (ECOTRIN) 81 MG tablet Take 1 tablet (81 mg total) by mouth daily.  Patient not taking: Reported on 02/07/2021       Medical History:  Past Medical History:   Diagnosis Date   ??? Cancer (CMS-HCC)    ??? Chronic kidney disease    ??? Diabetes mellitus (CMS-HCC)        Surgical History:  Past Surgical History:   Procedure Laterality Date   ??? BELOW KNEE LEG AMPUTATION Right    ??? heart stent       in 2005   ??? KIDNEY SURGERY Left     left kidney removed due to cancer   ??? PR TRANSPLANT,PREP CADAVER RENAL GRAFT Right 12/04/2020    Procedure: [facility] STD PREP CAD DONR RENAL ALLOGFT PRIOR TO TRNSPLNT, INCL DISSEC/REM PERINEPH FAT, DIAPH/RTPER ATTAC;  Surgeon: Gemma Payor, MD;  Location: MAIN OR [place];  Service: Transplant   ??? PR TRANSPLANTATION OF KIDNEY Right 12/04/2020    Procedure: RENAL ALLOTRANSPLANTATION, IMPLANTATION OF GRAFT; WITHOUT RECIPIENT NEPHRECTOMY;  Surgeon: Gemma Payor, MD;  Location: MAIN OR [place];  Service: Transplant   ??? TOE AMPUTATION Left     left big toe in 2018  Family History:   No family history on file.    Social History:  The patient lives with family    Social History     Tobacco Use   ??? Smoking status: Never Smoker   ??? Smokeless tobacco: Never Used   Vaping Use   ??? Vaping Use: Never used   Substance Use Topics   ??? Alcohol use: Not Currently   ??? Drug use: Never        Review of Systems:  10 systems were reviewed and are negative unless otherwise mentioned in the HPI    Objective:   Physical Exam:  Temp:  [36.3 ??C (97.3 ??F)] 36.3 ??C (97.3 ??F)  Heart Rate:  [70-88] 83  Resp:  [14-22] 15  BP: (139-201)/(51-114) 159/77  SpO2:  [90 %-99 %] 95 %    Gen: WDWN male in NAD, answers questions appropriately and resting comfortably  Eyes: Sclera anicteric, EOMI, PERRLA,  HENT: atraumatic, normocephalic, MMM. OP w/o erythema or exudate   Neck: no cervical lymphadenopathy or thyromegaly, no JVD  Heart: RRR, S1, S2, no M/R/G, no chest wall tenderness  Lungs: CTAB, no crackles or wheezes, no use of accessory muscles. Stable on 2L   Abdomen: Normoactive bowel sounds, soft, NTND, no rebound/guarding, no hepatosplenomegaly  Extremities: no clubbing, cyanosis, or edema: pulses are +2 in bilateral upper and lower extremities  Neuro: CN II-XI grossly intact, normal cerebellar function, normal gait. No focal deficits.  Skin:  No rashes, lesions on clothed exam  Psych: Alert, oriented, normal mood and affect.     Labs/Studies/Imaging:  Labs, Studies, Imaging from the last 24hrs per EMR and personally reviewed

## 2021-02-08 NOTE — Unmapped (Signed)
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 Denies pain. SOB, placed on cont pulse ox. Telemetry called, de satted to 84-86% on his home CPAP. Med b informed and RT paged. RT fixed home CPAP, there was leak and placed on oxygen. Solumedrol given this shift. BM this shift, per pt loose. No bleeding t o biopsy site. Call light and side table within reach. Continuing  To monitor pt.     Problem: Adult Inpatient Plan of Care  Goal: Plan of Care Review  Outcome: Ongoing - Unchanged  Goal: Patient-Specific Goal (Individualized)  Outcome: Ongoing - Unchanged  Goal: Absence of Hospital-Acquired Illness or Injury  Outcome: Ongoing - Unchanged  Intervention: Identify and Manage Fall Risk  Recent Flowsheet Documentation  Taken 02/07/2021 2024 by Casimiro Needle, RN  Safety Interventions:  ??? fall reduction program maintained  ??? lighting adjusted for tasks/safety  ??? low bed  ??? nonskid shoes/slippers when out of bed  Goal: Optimal Comfort and Wellbeing  Outcome: Ongoing - Unchanged  Goal: Readiness for Transition of Care  Outcome: Ongoing - Unchanged  Goal: Rounds/Family Conference  Outcome: Ongoing - Unchanged     Problem: Impaired Wound Healing  Goal: Optimal Wound Healing  Outcome: Ongoing - Unchanged     Problem: Fluid and Electrolyte Imbalance (Acute Kidney Injury/Impairment)  Goal: Fluid and Electrolyte Balance  Outcome: Ongoing - Unchanged     Problem: Oral Intake Inadequate (Acute Kidney Injury/Impairment)  Goal: Optimal Nutrition Intake  Outcome: Ongoing - Unchanged     Problem: Renal Function Impairment (Acute Kidney Injury/Impairment)  Goal: Effective Renal Function  Outcome: Ongoing - Unchanged     Problem: Breathing Pattern Ineffective  Goal: Effective Breathing Pattern  Outcome: Ongoing - Unchanged  Intervention: Promote Improved Breathing Pattern  Recent Flowsheet Documentation  Taken 02/07/2021 2024 by Leandro Reasoner Bawi Lakins, RN  Head of Bed [facility]) Positioning: HOB at 20-30 degrees     Problem: Diabetes Comorbidity  Goal: Blood Glucose Level Within Targeted Range  Outcome: Ongoing - Unchanged  Intervention: Monitor and Manage Glycemia  Recent Flowsheet Documentation  Taken 02/07/2021 2024 by Leandro Reasoner Lummie Montijo, RN  Glycemic Management: blood glucose monitored     Problem: Hypertension Comorbidity  Goal: Blood Pressure in Desired Range  Outcome: Ongoing - Unchanged

## 2021-02-08 NOTE — Unmapped (Signed)
Nephrology (MEDB) Progress Note    Assessment & Plan:   Nicholas Strong is a 55 y.o. 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 *    C/f Acute Rejection I S/p DDKT 12/04/20    Outpatient nephrologist Dr. Elvera Maria.  Patient initially presented for renal biopsy following rising creatinine and worsening hypertension in outpatient clinic.  Underwent DD KT 11/2020.  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 Post transplant nadir creatinine 2.3, now has risen to 3.6 on 02/07/21.  Kidney biopsy performed 02/07/2021.   - 500mg  solumedrol for 3 days (11/9 - 11/11)   - follow-up renal biopsy to assess for need for IVIG vs thymoglobulin/ plasmapharesis  - follow-up CMV, EBV levels  - Continue home envarsus 7mg  daily; pharmacy to dose  - Continue home myocophenolate 540mg  BID  - Continue home bactrim MWF, acyclovir 400mg  BID, Mavyret patient provided  - Continue IV lasix 40mg  BID  - Strict I&O  ??  Acute Hypoxic Respiratory Failure 2/2 Volume Overload- Resolved  Patient initially presenting to unit requiring 2 L O2 via [place].  Patient reports he was advised to hold his home standing Lasix 80 mg daily for 1 week prior to renal biopsy.  Chest x-ray on presentation notable for bilateral pleural effusions concerning for pulmonary edema.  As a result, started on IV Lasix 40 daily.    - Weaned to room air on 02/08/2021  - Continue diuresis as above   ??  ??T2DM:   - Continued Home basaglar 50 QAM decreased to 25 U at bedtime on admission;   - Increased meal insulin from 3U to 6U ACHS in the setting of steroid use  - SSI    Chronic Conditions  Allergic Rhinitis: Continue home cetirizine, PRN afrin  Hypertension: Continue home carvedilol 25mg  twice daily, chlorthalidone 25mg  daily, and amlodipine 10mg  daily    Hypothyroidism: Continue synthroid 75mg  daily  CAD: pravastatin 80mg  daily in place of home rosuvastatin 10mg  daily; ASA 81mg  daily held in setting of renal biopsy as above; resume as tolerated  atorvastatin to pravastatin to decrease the interaction with the mavyret   Hepatitis C: Continue home Mavyret (patient provided)  OSA: Continue home CPAP  ??    Daily Checklist:  Diet: Diabetic Diet  DVT PPx: Heparin 7500units q8h  Electrolytes: No Repletion Needed  Code Status: Full Code  Dispo: Goal Discharge: determine if regecting kidney then treat    Team Contact Information:   Primary Team: Nephrology (MEDB)  Primary Resident: Monia Pouch, MD  Resident's Pager: [phone] (Geriatrics Intern - Cyndee Brightly)    Interval History:   No acute events overnight.  Patient is sitting up and mobile around his room.  He is very happy with his care so far.  He seems anxious to figure out what is going on for his kidney.  His wife will bring him his HCV medicine today.      Objective:   Temp:  [36.3 ??C (97.3 ??F)-37.2 ??C (99 ??  F)] 36.3 ??C (97.3 ??F)  Heart Rate:  [71-102] 102  Resp:  [18-20] 20  BP: (159-180)/(64-78) 171/78  FiO2 (%):  [28 %] 28 %  SpO2:  [93 %-99 %] 97 %    Gen: WDWN male in NAD, answers questions appropriately and resting comfortably  Eyes: Sclera anicteric, EOMI  HENT: atraumatic, normocephalic  Heart: RRR, S1, S2, no M/R/G, no chest wall tenderness  Lungs: CTAB, no crackles or wheezes, no use of accessory muscles. On room air  Neuro: No focal deficits.  Skin:  No rashes, lesions on clothed exam  Psych: Alert, oriented, normal mood and affect.     Labs/Studies: Labs and Studies from the last 24hrs per EMR and Reviewed    Monia Pouch, MD PhD  PGY1  Dept Internal Medicine

## 2021-02-08 NOTE — Unmapped (Signed)
Case Management Brief Assessment      General:  Care Manager assessed the patient by : Medical record review     Pt is within 1 year of kidney transplant (12/04/2020) and will be followed by transplant SW team.  CM will assist w/ any home infusion needs as indicated.  Kidney Txp CM/SW can be reached at shared Inpatient Kidney SW Pager/[phone]      Extended Emergency Contact Information  Primary Emergency Contact: stewart,cynthia  Address: [address]           [place], [place] [address] [place] [employer] Phone: [phone]  Relation: Spouse      Financial Information:  Need for financial assistance?: No         Discharge Needs:  Concerns to be Addressed: discharge planning           Discharge Plan: defer to txp SW  :     Initial Assessment complete?: Yes        Additional Information:    HCDM (patient stated preference): stewart,cynthia - Spouse - [phone]    Social Determinants of Health     Tobacco Use: Low Risk    ??? Smoking Tobacco Use: Never Smoker   ??? Smokeless Tobacco Use: Never Used   Alcohol Use: Not on file   Financial Resource Strain: Not on file   Food Insecurity: Not on file   Transportation Needs: Not on file   Physical Activity: Not on file   Stress: Not on file   Social Connections: Not on file   Intimate Partner Violence: Not on file   Depression: Not on file   Housing/Utilities: Not on file   Substance Use: Not on file   Health Literacy: Not on file       Predictive Model Details          20% (Medium)  Factor Value    Calculated 02/08/2021 04:05 32% Number of active Rx orders 47    [facility] Risk of Unplanned Readmission Model 9% ECG/EKG order present in last 6 months     8% Latest BUN high (49 mg/dL)     6% Imaging order present in last 6 months     6% Latest hemoglobin low (9.3 g/dL)     6% Phosphorous result present     6% Charlson Comorbidity Index 5     5% Number of hospitalizations in last year 1     4% Diagnosis of deficiency anemia present     4% Age 4     4% Active corticosteroid Rx order present     4% Latest creatinine high (3.38 mg/dL)     4% Diagnosis of renal failure present     2% Future appointment scheduled     1% Current length of stay 0.72 days

## 2021-02-08 NOTE — Unmapped (Signed)
Social Work  Psychosocial Assessment    Patient Name: Nicholas Strong   Medical Record Number: [id number]   Date of Birth: 03/21/1966  Sex: Male     Referral  Referred by: Care Manager  Reason for Referral: Complex Discharge Planning  No Psychosocial Interventions Necessary: No Psychosocial Interventions Necessary    Extended Emergency Contact Information  Primary Emergency Contact: stewart,cynthia  Address: [address]           [place], [place] [address] [place] [employer] Phone: [phone]  Relation: Spouse    Legal Next of Kin / Guardian / POA / Advance Directives    HCDM (patient stated preference): stewart,cynthia - Spouse - [phone]    Advance Directive (Medical Treatment)  Does patient have an advance directive covering medical treatment?: Patient does not have advance directive covering medical treatment.  Reason patient does not have an advance directive covering medical treatment:: Patient does not wish to complete one at this time.    Health Care Decision Maker [HCDM] (Medical & Mental Health Treatment)  Healthcare Decision Maker: Patient does not wish to appoint a Health Care Decision Maker at this time  Information offered on HCDM, Medical & Mental Health advance directives:: Patient given information.    Advance Directive (Mental Health Treatment)  Does patient have an advance directive covering mental health treatment?: Patient does not have advance directive covering mental health treatment.  Reason patient does not have an advance directive covering mental health treatment:: Patient does not wish to complete one at this time.    Discharge Planning  Discharge Planning Information:   Type of Residence   Mailing Address:  [address]  [place] [address]    Medical Information   Past Medical History:   Diagnosis Date   ??? Cancer (CMS-HCC)    ??? Chronic kidney disease    ??? Coronary artery disease    ??? Diabetes mellitus (CMS-HCC)    ??? Disease of thyroid gland    ??? History of transfusion    ??? Hypertension        Past Surgical History:   Procedure Laterality Date   ??? BELOW KNEE LEG AMPUTATION Right    ??? EYE SURGERY     ??? heart stent       in 2005   ??? KIDNEY SURGERY Left     left kidney removed due to cancer   ??? NEPHRECTOMY TRANSPLANTED ORGAN     ??? PR TRANSPLANT,PREP CADAVER RENAL GRAFT Right 12/04/2020    Procedure: [facility] STD PREP CAD DONR RENAL ALLOGFT PRIOR TO TRNSPLNT, INCL DISSEC/REM PERINEPH FAT, DIAPH/RTPER ATTAC;  Surgeon: Gemma Payor, MD;  Location: MAIN OR [place];  Service: Transplant   ??? PR TRANSPLANTATION OF KIDNEY Right 12/04/2020    Procedure: RENAL ALLOTRANSPLANTATION, IMPLANTATION OF GRAFT; WITHOUT RECIPIENT NEPHRECTOMY;  Surgeon: Gemma Payor, MD;  Location: MAIN OR [place];  Service: Transplant   ??? TOE AMPUTATION Left     left big toe in 2018   ??? TONSILLECTOMY     ??? VASCULAR SURGERY         History reviewed. No pertinent family history.    Financial Information   Primary Insurance: Payor: Advertising copywriter MEDICARE ADV / Plan: Advertising copywriter MEDICARE ADV / Product Type: *No Product type* /    Secondary Insurance: Secondary Insurance  MEDICAID [place]   Prescription Coverage: Medicare D     Preferred Pharmacy: [facility]  [facility]  [facility] [place] - [place] - [phone]    Barriers to taking medication: No    Transition Home   Transportation at time of discharge: Family/Friend's Private Vehicle    Anticipated changes related to Illness: TBD   Services in place prior to admission: N/A   Services anticipated for DC: TBD   Hemodialysis Prior to Admission: No    Readmission  Risk of Unplanned Readmission Score: UNPLANNED READMISSION SCORE: 19.47%  Readmitted Within the Last 30 Days?   Readmission Factors include: other: w/in 1 year of txp    Social Determinants of Health  Social Determinants of Health were addressed in provider documentation.  Please refer to patient history.    Social History  Support Systems/Concerns: Artist: No Field seismologist Affecting Healthcare: none    Medical and Psychiatric History  Psychosocial Stressors: Denies      Psychological Issues/Information: No issues              Chemical Dependency: None              Outpatient Providers: Specialist   Name / Contact #: : [place] [facility]  Legal: No legal issues      Ability to [employer]: No issues accessing community services      **  CM met with patient in pt room.  Pt/visitors were not wearing hospital provided masks for the duration of the interaction.   CM was wearing hospital provided surgical mask and hospital provided eye protection.  CM was not within 6 foot of the patient/visitors during this interaction.     Met w/ pt at bedside.  Pt reports that he was readmitted after biopsy for rising Cr.  He has started on iv steriods and ongoing treatment will be pending final biopsy results.  No specific needs at this time.      Will continue to follow as needed.    Lowella Petties, LCSW, CCTSW

## 2021-02-09 LAB — BASIC METABOLIC PANEL
ANION GAP: 10 mmol/L (ref 5–14)
BLOOD UREA NITROGEN: 63 mg/dL — ABNORMAL HIGH (ref 9–23)
BUN / CREAT RATIO: 16
CALCIUM: 9.4 mg/dL (ref 8.7–10.4)
CHLORIDE: 109 mmol/L — ABNORMAL HIGH (ref 98–107)
CO2: 20 mmol/L (ref 20.0–31.0)
CREATININE: 3.97 mg/dL — ABNORMAL HIGH
EGFR CKD-EPI (2021) MALE: 17 mL/min/{1.73_m2} — ABNORMAL LOW (ref >=60)
GLUCOSE RANDOM: 256 mg/dL — ABNORMAL HIGH (ref 70–179)
POTASSIUM: 5.6 mmol/L — ABNORMAL HIGH (ref 3.4–4.8)
SODIUM: 139 mmol/L (ref 135–145)

## 2021-02-09 LAB — CBC
HEMATOCRIT: 28.3 % — ABNORMAL LOW (ref 39.0–48.0)
HEMOGLOBIN: 9.2 g/dL — ABNORMAL LOW (ref 12.9–16.5)
MEAN CORPUSCULAR HEMOGLOBIN CONC: 32.5 g/dL (ref 32.0–36.0)
MEAN CORPUSCULAR HEMOGLOBIN: 28 pg (ref 25.9–32.4)
MEAN CORPUSCULAR VOLUME: 86 fL (ref 77.6–95.7)
MEAN PLATELET VOLUME: 8.8 fL (ref 6.8–10.7)
PLATELET COUNT: 177 10*9/L (ref 150–450)
RED BLOOD CELL COUNT: 3.29 10*12/L — ABNORMAL LOW (ref 4.26–5.60)
RED CELL DISTRIBUTION WIDTH: 16.9 % — ABNORMAL HIGH (ref 12.2–15.2)
WBC ADJUSTED: 8.8 10*9/L (ref 3.6–11.2)

## 2021-02-09 LAB — PHOSPHORUS: PHOSPHORUS: 4 mg/dL (ref 2.4–5.1)

## 2021-02-09 LAB — TACROLIMUS LEVEL, TROUGH: TACROLIMUS, TROUGH: 2.3 ng/mL — ABNORMAL LOW (ref 5.0–15.0)

## 2021-02-09 LAB — MAGNESIUM: MAGNESIUM: 2.1 mg/dL (ref 1.6–2.6)

## 2021-02-09 MED ADMIN — chlorthalidone (HYGROTON) tablet 50 mg: 50 mg | ORAL | @ 11:00:00

## 2021-02-09 MED ADMIN — insulin lispro (HumaLOG) injection 8 Units: 8 [IU] | SUBCUTANEOUS | @ 20:00:00

## 2021-02-09 MED ADMIN — carvediloL (COREG) tablet 25 mg: 25 mg | ORAL | @ 13:00:00

## 2021-02-09 MED ADMIN — cetirizine (ZyrTEC) tablet 10 mg: 10 mg | ORAL | @ 02:00:00

## 2021-02-09 MED ADMIN — acyclovir (ZOVIRAX) capsule 400 mg: 400 mg | ORAL | @ 02:00:00

## 2021-02-09 MED ADMIN — insulin lispro (HumaLOG) injection 0-20 Units: 0-20 [IU] | SUBCUTANEOUS | @ 04:00:00

## 2021-02-09 MED ADMIN — acyclovir (ZOVIRAX) capsule 400 mg: 400 mg | ORAL | @ 13:00:00

## 2021-02-09 MED ADMIN — methylPREDNISolone sodium succinate (PF) (Solu-MEDROL) 500 mg in sodium chloride (NS) 0.9 % 50 mL IVPB: 500 mg | INTRAVENOUS | @ 16:00:00 | Stop: 2021-02-09

## 2021-02-09 MED ADMIN — mycophenolate (MYFORTIC) EC tablet 540 mg: 540 mg | ORAL | @ 13:00:00

## 2021-02-09 MED ADMIN — levothyroxine (SYNTHROID) tablet 75 mcg: 75 ug | ORAL | @ 11:00:00

## 2021-02-09 MED ADMIN — heparin (porcine) 5,000 unit/mL injection 7,500 Units: 7500 [IU] | SUBCUTANEOUS | @ 11:00:00

## 2021-02-09 MED ADMIN — insulin lispro (HumaLOG) injection 0-20 Units: 0-20 [IU] | SUBCUTANEOUS | @ 20:00:00

## 2021-02-09 MED ADMIN — sulfamethoxazole-trimethoprim (BACTRIM) 400-80 mg tablet 80 mg of trimethoprim: 1 | ORAL | @ 13:00:00 | Stop: 2021-02-21

## 2021-02-09 MED ADMIN — insulin glargine (LANTUS) injection 25 Units: 25 [IU] | SUBCUTANEOUS | @ 02:00:00

## 2021-02-09 MED ADMIN — furosemide (LASIX) injection 40 mg: 40 mg | INTRAVENOUS | @ 20:00:00

## 2021-02-09 MED ADMIN — magnesium oxide-Mg AA chelate (Magnesium Plus Protein) 1 tablet: 1 | ORAL | @ 16:00:00

## 2021-02-09 MED ADMIN — heparin (porcine) 5,000 unit/mL injection 7,500 Units: 7500 [IU] | SUBCUTANEOUS | @ 03:00:00

## 2021-02-09 MED ADMIN — magnesium oxide-Mg AA chelate (Magnesium Plus Protein) 1 tablet: 1 | ORAL | @ 02:00:00

## 2021-02-09 MED ADMIN — pravastatin (PRAVACHOL) tablet 40 mg: 40 mg | ORAL | @ 13:00:00

## 2021-02-09 MED ADMIN — carvediloL (COREG) tablet 25 mg: 25 mg | ORAL | @ 02:00:00

## 2021-02-09 MED ADMIN — furosemide (LASIX) injection 40 mg: 40 mg | INTRAVENOUS | @ 11:00:00

## 2021-02-09 MED ADMIN — tacrolimus (ENVARSUS XR) extended release tablet 7 mg: 7 mg | ORAL | @ 13:00:00

## 2021-02-09 MED ADMIN — amLODIPine (NORVASC) tablet 10 mg: 10 mg | ORAL | @ 13:00:00

## 2021-02-09 MED ADMIN — glecaprevir-pibrentasvir (MAVYRET) tablet 3 tablet **Patient Supplied**: 3 | ORAL | @ 13:00:00

## 2021-02-09 MED ADMIN — heparin (porcine) 5,000 unit/mL injection 7,500 Units: 7500 [IU] | SUBCUTANEOUS | @ 20:00:00

## 2021-02-09 MED ADMIN — mycophenolate (MYFORTIC) EC tablet 540 mg: 540 mg | ORAL | @ 02:00:00

## 2021-02-09 MED ADMIN — insulin lispro (HumaLOG) injection 0-20 Units: 0-20 [IU] | SUBCUTANEOUS | @ 16:00:00

## 2021-02-09 MED ADMIN — insulin lispro (HumaLOG) injection 6 Units: 6 [IU] | SUBCUTANEOUS | @ 16:00:00 | Stop: 2021-02-09

## 2021-02-09 NOTE — Unmapped (Signed)
Prelim biopsy results for Nicholas Strong.    Some tubulitis with out interstitial inflammatory infiltrate (i0 t2) not meeting criteria for borderline ACR.   No ptc, no endarteritis, c4d negative  Moderate calcium and phosphorous deposits in medulla, less than seen in nephrocalcinosis but elevated above normal.  Stable from zero-hr biopsy:  - c1q staining in mesangium  - moderate arteriosclerosis    Discussing results and next steps with med B.

## 2021-02-09 NOTE — Unmapped (Signed)
Tacrolimus Therapeutic Monitoring Pharmacy Note    Nicholas Strong is a 55 y.o. male continuing tacrolimus.     Indication: Kidney transplant     Date of Transplant: 12/04/20      Prior Dosing Information: Home regimen Envarsus 7 mg daily     Goals:  Therapeutic Drug Levels  Tacrolimus trough goal: 8-10 ng/mL    Additional Clinical Monitoring/Outcomes  ?? Monitor renal function (SCr and urine output) and liver function (LFTs)  ?? Monitor for signs/symptoms of adverse events (e.g., hyperglycemia, hyperkalemia, hypomagnesemia, hypertension, headache, tremor)    Results:   Tacrolimus level: 2.3 ng/ml on 02/09/21; The last few days, levels were in the 6-7 range.     Pharmacokinetic Considerations and Significant Drug Interactions:  ??? Concurrent hepatotoxic medications: None identified  ??? Concurrent CYP3A4 substrates/inhibitors: None identified  ??? Concurrent nephrotoxic medications: None identified    Assessment/Plan:  Recommendedation(s)  ??? Continue current regimen of Envarsus 7 mg daily because today's concentration may be an outlier.     Follow-up  ??? Next level has been ordered on 11/12//22 at 0600.   ??? A pharmacist will continue to monitor and recommend levels as appropriate    Please page service pharmacist with questions/clarifications.    Candee Furbish, RPh

## 2021-02-09 NOTE — Unmapped (Signed)
Lasix changed to twice a day. Patient voiding. Strict I&Os ordered and will provide urinal to keep better track of output. Meal time insulin adjusted. Patient eating well. No complaints of pain today. Up in recliner for majority of day.  Problem: Adult Inpatient Plan of Care  Goal: Plan of Care Review  Outcome: Ongoing - Unchanged

## 2021-02-09 NOTE — Unmapped (Signed)
Pt used home CPAP overnight. On and off sleep. BG taken and insulin given per order. Input and output monitored. No SOB. Denies pain when asked. Bed in low locked position. Call light within reach. Continuing to monitor pt.     Problem: Adult Inpatient Plan of Care  Goal: Plan of Care Review  Outcome: Ongoing - Unchanged  Goal: Patient-Specific Goal (Individualized)  Outcome: Ongoing - Unchanged  Goal: Absence of Hospital-Acquired Illness or Injury  Outcome: Ongoing - Unchanged  Intervention: Identify and Manage Fall Risk  Recent Flowsheet Documentation  Taken 02/08/2021 2035 by Casimiro Needle, RN  Safety Interventions:  ??? fall reduction program maintained  ??? lighting adjusted for tasks/safety  ??? low bed  ??? nonskid shoes/slippers when out of bed  Goal: Optimal Comfort and Wellbeing  Outcome: Ongoing - Unchanged  Goal: Readiness for Transition of Care  Outcome: Ongoing - Unchanged  Goal: Rounds/Family Conference  Outcome: Ongoing - Unchanged     Problem: Impaired Wound Healing  Goal: Optimal Wound Healing  Outcome: Ongoing - Unchanged     Problem: Fluid and Electrolyte Imbalance (Acute Kidney Injury/Impairment)  Goal: Fluid and Electrolyte Balance  Outcome: Ongoing - Unchanged     Problem: Oral Intake Inadequate (Acute Kidney Injury/Impairment)  Goal: Optimal Nutrition Intake  Outcome: Ongoing - Unchanged     Problem: Renal Function Impairment (Acute Kidney Injury/Impairment)  Goal: Effective Renal Function  Outcome: Ongoing - Unchanged     Problem: Breathing Pattern Ineffective  Goal: Effective Breathing Pattern  Outcome: Ongoing - Unchanged  Intervention: Promote Improved Breathing Pattern  Recent Flowsheet Documentation  Taken 02/08/2021 2035 by Leandro Reasoner Tyqwan Pink, RN  Head of Bed (HOB) Positioning: HOB at 20-30 degrees     Problem: Diabetes Comorbidity  Goal: Blood Glucose Level Within Targeted Range  Outcome: Ongoing - Unchanged     Problem: Hypertension Comorbidity  Goal: Blood Pressure in Desired Range  Outcome: Ongoing - Unchanged

## 2021-02-10 LAB — BASIC METABOLIC PANEL
ANION GAP: 11 mmol/L (ref 5–14)
BLOOD UREA NITROGEN: 76 mg/dL — ABNORMAL HIGH (ref 9–23)
BUN / CREAT RATIO: 17
CALCIUM: 9.2 mg/dL (ref 8.7–10.4)
CHLORIDE: 107 mmol/L (ref 98–107)
CO2: 20 mmol/L (ref 20.0–31.0)
CREATININE: 4.38 mg/dL — ABNORMAL HIGH
EGFR CKD-EPI (2021) MALE: 15 mL/min/{1.73_m2} — ABNORMAL LOW (ref >=60)
GLUCOSE RANDOM: 248 mg/dL — ABNORMAL HIGH (ref 70–179)
POTASSIUM: 5.2 mmol/L — ABNORMAL HIGH (ref 3.4–4.8)
SODIUM: 138 mmol/L (ref 135–145)

## 2021-02-10 LAB — CBC
HEMATOCRIT: 28.1 % — ABNORMAL LOW (ref 39.0–48.0)
HEMOGLOBIN: 9.2 g/dL — ABNORMAL LOW (ref 12.9–16.5)
MEAN CORPUSCULAR HEMOGLOBIN CONC: 32.8 g/dL (ref 32.0–36.0)
MEAN CORPUSCULAR HEMOGLOBIN: 28.1 pg (ref 25.9–32.4)
MEAN CORPUSCULAR VOLUME: 85.8 fL (ref 77.6–95.7)
MEAN PLATELET VOLUME: 8.8 fL (ref 6.8–10.7)
PLATELET COUNT: 195 10*9/L (ref 150–450)
RED BLOOD CELL COUNT: 3.27 10*12/L — ABNORMAL LOW (ref 4.26–5.60)
RED CELL DISTRIBUTION WIDTH: 16.6 % — ABNORMAL HIGH (ref 12.2–15.2)
WBC ADJUSTED: 8.8 10*9/L (ref 3.6–11.2)

## 2021-02-10 LAB — CMV DNA, QUANTITATIVE, PCR: CMV VIRAL LD: NOT DETECTED

## 2021-02-10 LAB — PHOSPHORUS: PHOSPHORUS: 4.1 mg/dL (ref 2.4–5.1)

## 2021-02-10 LAB — MAGNESIUM: MAGNESIUM: 2.2 mg/dL (ref 1.6–2.6)

## 2021-02-10 MED ADMIN — insulin lispro (HumaLOG) injection 0-20 Units: 0-20 [IU] | SUBCUTANEOUS | @ 19:00:00

## 2021-02-10 MED ADMIN — cetirizine (ZyrTEC) tablet 10 mg: 10 mg | ORAL | @ 02:00:00

## 2021-02-10 MED ADMIN — tacrolimus (ENVARSUS XR) extended release tablet 7 mg: 7 mg | ORAL | @ 14:00:00 | Stop: 2021-02-11

## 2021-02-10 MED ADMIN — insulin lispro (HumaLOG) injection 8 Units: 8 [IU] | SUBCUTANEOUS | @ 19:00:00 | Stop: 2021-02-11

## 2021-02-10 MED ADMIN — insulin lispro (HumaLOG) injection 8 Units: 8 [IU] | SUBCUTANEOUS | Stop: 2021-02-11

## 2021-02-10 MED ADMIN — acyclovir (ZOVIRAX) capsule 400 mg: 400 mg | ORAL | @ 03:00:00

## 2021-02-10 MED ADMIN — carvediloL (COREG) tablet 25 mg: 25 mg | ORAL | @ 02:00:00

## 2021-02-10 MED ADMIN — amLODIPine (NORVASC) tablet 10 mg: 10 mg | ORAL | @ 14:00:00

## 2021-02-10 MED ADMIN — insulin lispro (HumaLOG) injection 8 Units: 8 [IU] | SUBCUTANEOUS

## 2021-02-10 MED ADMIN — insulin lispro (HumaLOG) injection 0-20 Units: 0-20 [IU] | SUBCUTANEOUS | @ 05:00:00

## 2021-02-10 MED ADMIN — heparin (porcine) 5,000 unit/mL injection 7,500 Units: 7500 [IU] | SUBCUTANEOUS | @ 19:00:00

## 2021-02-10 MED ADMIN — magnesium oxide-Mg AA chelate (Magnesium Plus Protein) 1 tablet: 1 | ORAL | @ 03:00:00

## 2021-02-10 MED ADMIN — mycophenolate (MYFORTIC) EC tablet 540 mg: 540 mg | ORAL | @ 03:00:00

## 2021-02-10 MED ADMIN — insulin lispro (HumaLOG) injection 8 Units: 8 [IU] | SUBCUTANEOUS | @ 14:00:00 | Stop: 2021-02-11

## 2021-02-10 MED ADMIN — glecaprevir-pibrentasvir (MAVYRET) tablet 3 tablet **Patient Supplied**: 3 | ORAL | @ 18:00:00

## 2021-02-10 MED ADMIN — carvediloL (COREG) tablet 25 mg: 25 mg | ORAL | @ 14:00:00 | Stop: 2021-02-11

## 2021-02-10 MED ADMIN — insulin lispro (HumaLOG) injection 0-20 Units: 0-20 [IU] | SUBCUTANEOUS

## 2021-02-10 MED ADMIN — insulin glargine (LANTUS) injection 28 Units: 28 [IU] | SUBCUTANEOUS | @ 03:00:00

## 2021-02-10 MED ADMIN — heparin (porcine) 5,000 unit/mL injection 7,500 Units: 7500 [IU] | SUBCUTANEOUS | @ 11:00:00

## 2021-02-10 MED ADMIN — acyclovir (ZOVIRAX) capsule 400 mg: 400 mg | ORAL | @ 14:00:00

## 2021-02-10 MED ADMIN — mycophenolate (MYFORTIC) EC tablet 540 mg: 540 mg | ORAL | @ 14:00:00

## 2021-02-10 MED ADMIN — pravastatin (PRAVACHOL) tablet 40 mg: 40 mg | ORAL | @ 14:00:00

## 2021-02-10 MED ADMIN — chlorthalidone (HYGROTON) tablet 50 mg: 50 mg | ORAL | @ 11:00:00

## 2021-02-10 MED ADMIN — furosemide (LASIX) injection 40 mg: 40 mg | INTRAVENOUS | @ 11:00:00 | Stop: 2021-02-10

## 2021-02-10 MED ADMIN — insulin lispro (HumaLOG) injection 0-20 Units: 0-20 [IU] | SUBCUTANEOUS | @ 14:00:00

## 2021-02-10 MED ADMIN — levothyroxine (SYNTHROID) tablet 75 mcg: 75 ug | ORAL | @ 11:00:00

## 2021-02-10 MED ADMIN — magnesium oxide-Mg AA chelate (Magnesium Plus Protein) 1 tablet: 1 | ORAL | @ 14:00:00

## 2021-02-10 MED ADMIN — heparin (porcine) 5,000 unit/mL injection 7,500 Units: 7500 [IU] | SUBCUTANEOUS | @ 03:00:00

## 2021-02-10 NOTE — Unmapped (Signed)
Nephrology ([facility]) Progress Note    Assessment & Plan:   Nicholas Strong is a 55 y.o. 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    Principal Problem:    Kidney transplant status, cadaveric  Active Problems:    ESRD (end stage renal disease) on dialysis (CMS-HCC)    Type 2 diabetes mellitus (CMS-HCC)    Benign essential hypertension  Resolved Problems:    * No resolved hospital problems. *    C/f Acute Rejection I S/p DDKT 12/04/20    Outpatient nephrologist Dr. Elvera Maria.  Underwent DD KT 11/2020. Patient initially presented for renal biopsy following rising creatinine and worsening hypertension in outpatient clinic. 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 Post transplant nadir creatinine 2.3. Initilally concerned for rejection, but biopsy on 02/07/21 not consistent with this. S/p 500mg  solumedrol for 3 days (11/9 - 11/11) without any improvement.  Creatinine increased to 4.38 on 11/12. Renal PVL concerning for renal artery stenosis. CMV not detected.   - Follow up MRA pelvis to assess renal artery stenosis   - Determine etiology of kidney issues  - follow-up EBV levels  - Continue home envarsus; pharmacy to dose  - Continue home myocophenolate 540mg  BID  - Continue home bactrim MWF, acyclovir 400mg  BID, Mavyret patient provided  - Discontinued IV lasix 40mg  after morning dose, and will spot treat in the setting of rising Cr.  - Strict I&O  ??  ??T2DM:   - Increased basaglar to 28 units (home dose reportedly 50 units, but accounting for incrased mealtime as below)  - Increased meal insulin from 6U to 8U ACHS in the setting of steroid use (home 3 units BID with breakfast, lunch)  - SSI  - Diabetic diet as below    Chronic Conditions  Allergic Rhinitis: Continue home cetirizine, PRN afrin  Hypertension: Continue home carvedilol 25mg  twice daily, chlorthalidone 25mg  daily, and amlodipine 10mg  daily  Hypothyroidism: Continue synthroid 75mg  daily  CAD: pravastatin 80mg  daily in place of home rosuvastatin 10mg  daily; ASA 81mg  daily held in setting of renal biopsy as above; resume as tolerated atorvastatin to pravastatin to decrease the interaction with the mavyret   Hepatitis C: Continue home Mavyret (patient provided)  OSA: Continue home CPAP      Daily Checklist:  Diet: Diabetic Diet  DVT PPx: Heparin 7500units q8h  Electrolytes: No Repletion Needed  Code Status: Full Code  Dispo: Goal Discharge: determine if regecting kidney then treat    Team Contact Information:   Primary Team: Nephrology ([facility])  Primary Resident: Monia Pouch, MD PhD  Resident's Pager: [phone] (Geriatrics Intern - Cyndee Brightly)    Interval History:   No acute events overnight.  Today patient is disappointed that his creatinine is rising.  He feels very lethargic and has been sleeping a lot.  He denies diarrhea and constipation.    Objective:   Temp:  [36.6 ??C (97.9 ??F)-37 ??C (98.6 ??F)] 37 ??C (98.6 ??F)  Heart Rate:  [85-94] 88  Resp:  [18-19] 18  BP: (145-192)/(52-79) 192/79  SpO2:  [94 %-98 %] 96 %    Gen: WDWN male in NAD, answers questions appropriately and resting comfortably  Heart: RRR, S1, S2, warm, 1-2+ LLE edema  Lungs: CPAP in place. TAB, no wheezes, no use of accessory muscles  Skin:  No rashes, lesions on clothed exam  Psych: Alert, oriented, normal mood and affect.     Labs/Studies: Labs and Studies from the last 24hrs per EMR and Reviewed    Monia Pouch, MD PhD  PGY1  Dept Internal Medicine      *Portions of this record have been created using Dragon dictation software. While dictation errors have been sought, it is possible that there are errors that have not been identified prior to signing this note.

## 2021-02-10 NOTE — Unmapped (Signed)
Pt alert and oriented x4. Pt has been afebrile with stable VS. Pt with no c/o pain or nausea. Pt with active bowel sounds and sufficient urine output. No new skin breakdown this shift. No s/s infection this shift. Fall precautions and pt safety maintained. Will continue to monitor.     Problem: Adult Inpatient Plan of Care  Goal: Plan of Care Review  Outcome: Ongoing - Unchanged  Goal: Patient-Specific Goal (Individualized)  Outcome: Ongoing - Unchanged  Goal: Absence of Hospital-Acquired Illness or Injury  Outcome: Ongoing - Unchanged  Intervention: Identify and Manage Fall Risk  Recent Flowsheet Documentation  Taken 02/10/2021 1221 by Frutoso Chase, RN  Safety Interventions: low bed  Goal: Optimal Comfort and Wellbeing  Outcome: Ongoing - Unchanged  Goal: Readiness for Transition of Care  Outcome: Ongoing - Unchanged  Goal: Rounds/Family Conference  Outcome: Ongoing - Unchanged     Problem: Impaired Wound Healing  Goal: Optimal Wound Healing  Outcome: Ongoing - Unchanged     Problem: Fluid and Electrolyte Imbalance (Acute Kidney Injury/Impairment)  Goal: Fluid and Electrolyte Balance  Outcome: Ongoing - Unchanged     Problem: Oral Intake Inadequate (Acute Kidney Injury/Impairment)  Goal: Optimal Nutrition Intake  Outcome: Ongoing - Unchanged     Problem: Renal Function Impairment (Acute Kidney Injury/Impairment)  Goal: Effective Renal Function  Outcome: Ongoing - Unchanged     Problem: Breathing Pattern Ineffective  Goal: Effective Breathing Pattern  Outcome: Ongoing - Unchanged     Problem: Diabetes Comorbidity  Goal: Blood Glucose Level Within Targeted Range  Outcome: Ongoing - Unchanged     Problem: Hypertension Comorbidity  Goal: Blood Pressure in Desired Range  Outcome: Ongoing - Unchanged

## 2021-02-10 NOTE — Unmapped (Signed)
Nephrology ([facility]) Progress Note    Assessment & Plan:   Nicholas Strong is a 55 y.o. 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    Principal Problem:    Kidney transplant status, cadaveric  Active Problems:    ESRD (end stage renal disease) on dialysis (CMS-HCC)    Type 2 diabetes mellitus (CMS-HCC)    Benign essential hypertension  Resolved Problems:    * No resolved hospital problems. *    C/f Acute Rejection I S/p DDKT 12/04/20    Outpatient nephrologist Dr. Elvera Maria.  Underwent DD KT 11/2020. Patient initially presented for renal biopsy following rising creatinine and worsening hypertension in outpatient clinic. 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 Post transplant nadir creatinine 2.3, now has risen to 3.6. Initilally concerned for rejection, but biopsy on 02/07/21 not consistent with this. S/p 500mg  solumedrol for 3 days (11/9 - 11/11) without any improvement.  - Determine etiology of kidney issues  - follow-up CMV, EBV levels  - Continue home envarsus; pharmacy to dose  - Continue home myocophenolate 540mg  BID  - Continue home bactrim MWF, acyclovir 400mg  BID, Mavyret patient provided  - Continue IV lasix 40mg  BID. Titrate as necessarily   - Strict I&O  ??  ??T2DM:   - Increased basaglar to 28 units (home dose reportedly 50 units, but accounting for incrased mealtime as below)  - Increased meal insulin from 6U to 8U ACHS in the setting of steroid use (home 3 units BID with breakfast, lunch)  - SSI  - Diabetic diet as below    Chronic Conditions  Allergic Rhinitis: Continue home cetirizine, PRN afrin  Hypertension: Continue home carvedilol 25mg  twice daily, chlorthalidone 25mg  daily, and amlodipine 10mg  daily  Hypothyroidism: Continue synthroid 75mg  daily  CAD: pravastatin 80mg  daily in place of home rosuvastatin 10mg  daily; ASA 81mg  daily held in setting of renal biopsy as above; resume as tolerated atorvastatin to pravastatin to decrease the interaction with the mavyret   Hepatitis C: Continue home Mavyret (patient provided)  OSA: Continue home CPAP  ??    Daily Checklist:  Diet: Diabetic Diet  DVT PPx: Heparin 7500units q8h  Electrolytes: No Repletion Needed  Code Status: Full Code  Dispo: Goal Discharge: determine if regecting kidney then treat    Team Contact Information:   Primary Team: Nephrology ([facility])  Primary Resident: Sandra Cockayne, MD  Resident's Pager: [phone] (Geriatrics Intern - Cyndee Brightly)    Interval History:   No acute events overnight.    Doing fine. Feels leg swelling has improved.     Objective:   Temp:  [36.4 ??C (97.5 ??F)-36.8 ??C (98.2 ??F)] 36.8 ??C (98.2 ??F)  Heart Rate:  [85-101] 89  Resp:  [18-20] 18  BP: (145-170)/(52-77) 162/70  SpO2:  [95 %-98 %] 98 %    Gen: WDWN male in NAD, answers questions appropriately and resting comfortably  Heart: RRR, S1, S2, warm, unable to appreciate JVD, 1-2+ LLE edema  Lungs: CPAP in place. TAB, no wheezes, no use of accessory muscles  Skin:  No rashes, lesions on clothed exam  Psych: Alert, oriented, normal mood and affect.     Labs/Studies: Labs and Studies from the last 24hrs per EMR and Reviewed    --------------------------------------------------------------------  Sandra Cockayne, M.D.  [facility] Internal Medicine Resident, PGY-2    *  Portions of this record have been created using Scientist, clinical (histocompatibility and immunogenetics). While dictation errors have been sought, it is possible that there are errors that have not been identified prior to signing this note.

## 2021-02-10 NOTE — Unmapped (Signed)
Patient's insulin was adjusted today. He had a renal ultrasound performed today, as well. Patient has been very helpful with keeping track of his intake and output.   Problem: Adult Inpatient Plan of Care  Goal: Plan of Care Review  Outcome: Ongoing - Unchanged

## 2021-02-10 NOTE — Unmapped (Signed)
Patient denies any pain, vs are stable. He had urine output this shift, ambulates to the bathroom with no issue, will cont to monitor.    Problem: Adult Inpatient Plan of Care  Goal: Plan of Care Review  Outcome: Ongoing - Unchanged  Goal: Patient-Specific Goal (Individualized)  Outcome: Ongoing - Unchanged  Goal: Absence of Hospital-Acquired Illness or Injury  Outcome: Ongoing - Unchanged  Intervention: Identify and Manage Fall Risk  Recent Flowsheet Documentation  Taken 02/09/2021 2000 by Valentino Nose, RN  Safety Interventions:   fall reduction program maintained   low bed   no IV/BP/blood draw left arm   nonskid shoes/slippers when out of bed  Goal: Optimal Comfort and Wellbeing  Outcome: Ongoing - Unchanged  Goal: Readiness for Transition of Care  Outcome: Ongoing - Unchanged  Goal: Rounds/Family Conference  Outcome: Ongoing - Unchanged     Problem: Impaired Wound Healing  Goal: Optimal Wound Healing  Outcome: Ongoing - Unchanged     Problem: Fluid and Electrolyte Imbalance (Acute Kidney Injury/Impairment)  Goal: Fluid and Electrolyte Balance  Outcome: Ongoing - Unchanged     Problem: Oral Intake Inadequate (Acute Kidney Injury/Impairment)  Goal: Optimal Nutrition Intake  Outcome: Ongoing - Unchanged     Problem: Renal Function Impairment (Acute Kidney Injury/Impairment)  Goal: Effective Renal Function  Outcome: Ongoing - Unchanged     Problem: Breathing Pattern Ineffective  Goal: Effective Breathing Pattern  Outcome: Ongoing - Unchanged  Intervention: Promote Improved Breathing Pattern  Recent Flowsheet Documentation  Taken 02/09/2021 2000 by Valentino Nose, RN  Head of Bed (HOB) Positioning: HOB at 30-45 degrees     Problem: Diabetes Comorbidity  Goal: Blood Glucose Level Within Targeted Range  Outcome: Ongoing - Unchanged     Problem: Hypertension Comorbidity  Goal: Blood Pressure in Desired Range  Outcome: Ongoing - Unchanged

## 2021-02-10 NOTE — Unmapped (Addendum)
[ ]   Blood pressure medication titration  [ ]  Insulin titration     -------------------------    Nicholas Strong is a 55 y.o. male with PMHx of ESRD 2/2 DM2 s/p DDKT (11/2020), RCC s/p Left nephrectomy, HTN,  Polyneuropathy, Renal Stone, and kidney allotransplant on 12/04/2020 that presented to [facility] with rising creatinine (3.6 from nadir 2.3) and worsening hypertension.  Initially concern for rejection however biopsy not consistent with rejection.  Renal ultrasound showed possible renal artery stenosis of transplanted kidney, and unfortunately MRA was equivocal and could not visualize transplanted kidney appropriately. Would ideally want to spare additional contrast with CTA, though risk of renal arteriogram without more definitive evidence for renal artery stenosis would be too high at this time. After ongoing multidisciplinary discussions, given the fact that his creatinine continues to improve will elect to monitor closely as an outpatient and determine need for further studies (repeat MRA vs CTA) prior to arteriogram.     AKI I S/p DDKT 12/04/20    Outpatient nephrologist Dr. Elvera Maria.  Underwent DD KT 11/2020. 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  Post transplant nadir creatinine 2.3. Presented for renal biopsy following rising creatinine and worsening hypertension in outpatient clinic. Initially concerned for rejection, and treatment was started with 500 mg Solu-Medrol for 3 days (11/9 - 11/11). Biopsy on 02/07/21 preliminary pathology did not show signs of rejection.  CMV viral load not detected, EBV PCR negative.  Renal ultrasound with Dopplers concerning for renal artery stenosis, but unfortunately MRA was equivocal and could not visualize transplanted kidney appropriately. Would ideally want to spare additional contrast with CTA, though risk of renal arteriogram without more definitive evidence for renal artery stenosis would be too high at this time. After ongoing multidisciplinary discussions, given the fact that his creatinine continues to improve will elect to monitor closely as an outpatient and determine need for further studies (repeat MRA vs CTA) prior to arteriogram.   Transplant medications: envarsus dose adjusted to 8 on discharge; myocophenolate 540mg  BID; bactrim MWF; acyclovir 400mg  BID; Mavyret (patient provided).     Acute Hypoxic Respiratory Failure 2/2 Volume Overload- Resolved  Patient initially presenting to unit requiring 2 L O2 via .  Patient reports he was advised to hold his home standing Lasix 80 mg daily for 1 week prior to renal biopsy.  Chest x-ray on presentation notable for bilateral pleural effusions concerning for pulmonary edema. Diuresed with IV Lasix and and patient was able to wean to room air.     Resistant Hypertension  Presented because BP started rising in the outpatient setting.  Could certainly be secondary to renal artery stenosis or tacrolimus related. Discharge BP regimen: chlorthalidone 50mg  daily, amlodipine 10mg  daily, increased carvedilol from 25mg  to 37.5mg  twice daily, and hydralazine 25mg  TID.    T2DM:   Increased while on steroids, compleated 3 days of 500 mg of Solu-Medrol on 11/11. Will discharge on basal insulin of glargine 35U (home 50U nightly) and home 3 units BID with breakfast, lunch, with titration per PCP. Resume weekly ozempic.

## 2021-02-11 LAB — CBC
HEMATOCRIT: 28.6 % — ABNORMAL LOW (ref 39.0–48.0)
HEMOGLOBIN: 9.5 g/dL — ABNORMAL LOW (ref 12.9–16.5)
MEAN CORPUSCULAR HEMOGLOBIN CONC: 33.1 g/dL (ref 32.0–36.0)
MEAN CORPUSCULAR HEMOGLOBIN: 28.3 pg (ref 25.9–32.4)
MEAN CORPUSCULAR VOLUME: 85.7 fL (ref 77.6–95.7)
MEAN PLATELET VOLUME: 8.4 fL (ref 6.8–10.7)
PLATELET COUNT: 181 10*9/L (ref 150–450)
RED BLOOD CELL COUNT: 3.34 10*12/L — ABNORMAL LOW (ref 4.26–5.60)
RED CELL DISTRIBUTION WIDTH: 16.5 % — ABNORMAL HIGH (ref 12.2–15.2)
WBC ADJUSTED: 8.5 10*9/L (ref 3.6–11.2)

## 2021-02-11 LAB — BASIC METABOLIC PANEL
ANION GAP: 10 mmol/L (ref 5–14)
BLOOD UREA NITROGEN: 91 mg/dL — ABNORMAL HIGH (ref 9–23)
BUN / CREAT RATIO: 21
CALCIUM: 9.1 mg/dL (ref 8.7–10.4)
CHLORIDE: 107 mmol/L (ref 98–107)
CO2: 21 mmol/L (ref 20.0–31.0)
CREATININE: 4.26 mg/dL — ABNORMAL HIGH
EGFR CKD-EPI (2021) MALE: 16 mL/min/{1.73_m2} — ABNORMAL LOW (ref >=60)
GLUCOSE RANDOM: 230 mg/dL — ABNORMAL HIGH (ref 70–179)
POTASSIUM: 4.8 mmol/L (ref 3.4–4.8)
SODIUM: 138 mmol/L (ref 135–145)

## 2021-02-11 LAB — TACROLIMUS LEVEL, TROUGH: TACROLIMUS, TROUGH: 5.2 ng/mL (ref 5.0–15.0)

## 2021-02-11 LAB — PHOSPHORUS: PHOSPHORUS: 4 mg/dL (ref 2.4–5.1)

## 2021-02-11 LAB — MAGNESIUM: MAGNESIUM: 2.1 mg/dL (ref 1.6–2.6)

## 2021-02-11 MED ADMIN — insulin lispro (HumaLOG) injection 0-20 Units: 0-20 [IU] | SUBCUTANEOUS | @ 04:00:00

## 2021-02-11 MED ADMIN — amLODIPine (NORVASC) tablet 10 mg: 10 mg | ORAL | @ 15:00:00

## 2021-02-11 MED ADMIN — magnesium oxide-Mg AA chelate (Magnesium Plus Protein) 1 tablet: 1 | ORAL | @ 02:00:00

## 2021-02-11 MED ADMIN — acyclovir (ZOVIRAX) capsule 400 mg: 400 mg | ORAL | @ 02:00:00

## 2021-02-11 MED ADMIN — acyclovir (ZOVIRAX) capsule 400 mg: 400 mg | ORAL | @ 15:00:00

## 2021-02-11 MED ADMIN — chlorthalidone (HYGROTON) tablet 50 mg: 50 mg | ORAL | @ 10:00:00

## 2021-02-11 MED ADMIN — heparin (porcine) 5,000 unit/mL injection 7,500 Units: 7500 [IU] | SUBCUTANEOUS | @ 11:00:00

## 2021-02-11 MED ADMIN — heparin (porcine) 5,000 unit/mL injection 7,500 Units: 7500 [IU] | SUBCUTANEOUS | @ 02:00:00

## 2021-02-11 MED ADMIN — glecaprevir-pibrentasvir (MAVYRET) tablet 3 tablet **Patient Supplied**: 3 | ORAL | @ 15:00:00

## 2021-02-11 MED ADMIN — insulin glargine (LANTUS) injection 28 Units: 28 [IU] | SUBCUTANEOUS | @ 02:00:00 | Stop: 2021-02-11

## 2021-02-11 MED ADMIN — levothyroxine (SYNTHROID) tablet 75 mcg: 75 ug | ORAL | @ 10:00:00

## 2021-02-11 MED ADMIN — mycophenolate (MYFORTIC) EC tablet 540 mg: 540 mg | ORAL | @ 15:00:00

## 2021-02-11 MED ADMIN — tacrolimus (ENVARSUS XR) extended release tablet 7 mg: 7 mg | ORAL | @ 15:00:00 | Stop: 2021-02-11

## 2021-02-11 MED ADMIN — insulin lispro (HumaLOG) injection 0-20 Units: 0-20 [IU] | SUBCUTANEOUS | @ 23:00:00

## 2021-02-11 MED ADMIN — insulin lispro (HumaLOG) injection 0-20 Units: 0-20 [IU] | SUBCUTANEOUS | @ 18:00:00

## 2021-02-11 MED ADMIN — insulin lispro (HumaLOG) injection 10 Units: 10 [IU] | SUBCUTANEOUS | @ 23:00:00

## 2021-02-11 MED ADMIN — heparin (porcine) 5,000 unit/mL injection 7,500 Units: 7500 [IU] | SUBCUTANEOUS | @ 18:00:00

## 2021-02-11 MED ADMIN — cetirizine (ZyrTEC) tablet 10 mg: 10 mg | ORAL | @ 02:00:00

## 2021-02-11 MED ADMIN — magnesium oxide-Mg AA chelate (Magnesium Plus Protein) 1 tablet: 1 | ORAL | @ 15:00:00

## 2021-02-11 MED ADMIN — pravastatin (PRAVACHOL) tablet 40 mg: 40 mg | ORAL | @ 15:00:00

## 2021-02-11 MED ADMIN — mycophenolate (MYFORTIC) EC tablet 540 mg: 540 mg | ORAL | @ 02:00:00

## 2021-02-11 MED ADMIN — insulin lispro (HumaLOG) injection 10 Units: 10 [IU] | SUBCUTANEOUS | @ 15:00:00

## 2021-02-11 MED ADMIN — insulin lispro (HumaLOG) injection 0-20 Units: 0-20 [IU] | SUBCUTANEOUS | @ 15:00:00

## 2021-02-11 MED ADMIN — simethicone (MYLICON) chewable tablet 80 mg: 80 mg | ORAL | @ 18:00:00

## 2021-02-11 MED ADMIN — carvediloL (COREG) tablet 25 mg: 25 mg | ORAL | @ 02:00:00 | Stop: 2021-02-11

## 2021-02-11 NOTE — Unmapped (Signed)
Pt alert and oriented x4. Pt has been afebrile with stable VS. Pt with no c/o pain or nausea. Pt with active bowel sounds and sufficient urine output. No new skin breakdown this shift. No s/s infection this shift. Still awaiting MRA. Fall precautions and pt safety maintained. Will continue to monitor.     Problem: Adult Inpatient Plan of Care  Goal: Plan of Care Review  Outcome: Ongoing - Unchanged  Goal: Patient-Specific Goal (Individualized)  Outcome: Ongoing - Unchanged  Goal: Absence of Hospital-Acquired Illness or Injury  Outcome: Ongoing - Unchanged  Intervention: Identify and Manage Fall Risk  Recent Flowsheet Documentation  Taken 02/11/2021 0958 by Frutoso Chase, RN  Safety Interventions: fall reduction program maintained  Goal: Optimal Comfort and Wellbeing  Outcome: Ongoing - Unchanged  Goal: Readiness for Transition of Care  Outcome: Ongoing - Unchanged  Goal: Rounds/Family Conference  Outcome: Ongoing - Unchanged     Problem: Impaired Wound Healing  Goal: Optimal Wound Healing  Outcome: Ongoing - Unchanged     Problem: Fluid and Electrolyte Imbalance (Acute Kidney Injury/Impairment)  Goal: Fluid and Electrolyte Balance  Outcome: Ongoing - Unchanged     Problem: Oral Intake Inadequate (Acute Kidney Injury/Impairment)  Goal: Optimal Nutrition Intake  Outcome: Ongoing - Unchanged     Problem: Renal Function Impairment (Acute Kidney Injury/Impairment)  Goal: Effective Renal Function  Outcome: Ongoing - Unchanged     Problem: Breathing Pattern Ineffective  Goal: Effective Breathing Pattern  Outcome: Ongoing - Unchanged     Problem: Diabetes Comorbidity  Goal: Blood Glucose Level Within Targeted Range  Outcome: Ongoing - Unchanged     Problem: Hypertension Comorbidity  Goal: Blood Pressure in Desired Range  Outcome: Ongoing - Unchanged

## 2021-02-11 NOTE — Unmapped (Signed)
Nephrology ([facility]) Progress Note    Assessment & Plan:   Nicholas Strong is a 55 y.o. male with a PMHx of ESRD 2/2 DM2 s/p DDKT (11/2020), RCC s/p Left nephrectomy, HTN,  Polyneuropathy,??Renal Stone,??and   kidney allotransplant on 12/04/2020 that presented to [facility] with rising creatinine (3.6 from nadir 2.3) post-transplant.    Principal Problem:    Kidney transplant status, cadaveric  Active Problems:    ESRD (end stage renal disease) on dialysis (CMS-HCC)    Type 2 diabetes mellitus (CMS-HCC)    Benign essential hypertension  Resolved Problems:    * No resolved hospital problems. *    AKI I S/p DDKT 12/04/20    Outpatient nephrologist Dr. Elvera Maria.  Underwent DD KT 11/2020. Patient initially presented for renal biopsy following rising creatinine and worsening hypertension in outpatient clinic. 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 CMV viral load not detected. Post transplant nadir creatinine 2.3. Initilally concerned for rejection, but biopsy on 02/07/21 not consistent with this. S/p 500mg  solumedrol for 3 days (11/9 - 11/11) without any improvement. Creatinine increased to 4.38 on 11/12. Renal PVL concerning for renal artery stenosis.   - Determine etiology of kidney issues  - Follow up MRA pelvis w/o contrast to assess renal artery stenosis   - follow-up EBV PCR quant - in process   - Continue home envarsus; pharmacy to dose  - Continue home myocophenolate 540mg  BID  - Continue home bactrim MWF, acyclovir 400mg  BID, Mavyret patient provided  - Given that Cr was stable from yesterday, will continue to hold Lasix  - Strict I&O  ??  ??T2DM:   - Basal: nightly glargine increasing from 28U to 32U (home 50U nightly)  - Meal: lispro increase 8U to 10U ACHS (home 3 units BID with breakfast, lunch)  - SSI    Hypertension:   Continues to be hypertensive up to SBP 190s.   - Increased carvedilol 25mg  to 37.5mg  twice daily  - Continue home chlorthalidone 25mg  daily   - Continue amlodipine 10mg  daily    Chronic Conditions  Allergic Rhinitis: Continue home cetirizine, PRN afrin  Hypothyroidism: Continue synthroid 75mg  daily  CAD: pravastatin 80mg  daily in place of home rosuvastatin 10mg  daily; ASA 81mg  daily held in setting of renal biopsy as above; resume as tolerated atorvastatin to pravastatin to decrease the interaction with the mavyret   Hepatitis C: Continue home Mavyret (patient provided)  OSA: Continue home CPAP    Daily Checklist:  Diet: Diabetic Diet  DVT PPx: Heparin 7500units q8h  Electrolytes: No Repletion Needed  Code Status: Full Code  Dispo: Goal Discharge: determine if regecting kidney then treat    Team Contact Information:   Primary Team: Nephrology ([facility])  Primary Resident: Monia Pouch, MD PhD  Resident's Pager: [phone] (Geriatrics Intern - Cyndee Brightly)    Interval History:   No acute events overnight.  Today he remains stable and feeling well. Still denies symptoms of uremia.  He does have some diarrhea that started overnight.  He says this happens at home to so it is less alarming.    Objective:   Temp:  [36.7 ??C (98.1 ??F)-37.1 ??C (98.8 ??F)] 36.7 ??C (98.1 ??F)  Heart Rate:  [81-88] 81  Resp:  [18-20] 18  BP: (165-190)/(63-78) 190/78  SpO2:  [97 %] 97 %    Gen: WDWN male in NAD, answers questions appropriately and resting comfortably  Heart: RRR, S1, S2, warm, 1-2+ LLE edema  Lungs: CPAP in place.  TAB, no wheezes, no use of accessory muscles  Skin:  No rashes, lesions on clothed exam  Psych: Alert, oriented, normal mood and affect.     Labs/Studies: Labs and Studies from the last 24hrs per EMR and Reviewed    Monia Pouch, MD PhD  PGY1  Dept Internal Medicine      *Portions of this record have been created using Dragon dictation software. While dictation errors have been sought, it is possible that there are errors that have not been identified prior to signing this note.

## 2021-02-11 NOTE — Unmapped (Addendum)
Tacrolimus Therapeutic Monitoring Pharmacy Note    Nicholas Strong is a 55 y.o. male continuing tacrolimus.     Indication: Kidney transplant     Date of Transplant: 12/04/20      Prior Dosing Information: Home regimen Envarsus 7 mg daily     Goals:  Therapeutic Drug Levels  Tacrolimus trough goal: 8-10 ng/mL    Additional Clinical Monitoring/Outcomes  ?? Monitor renal function (SCr and urine output) and liver function (LFTs)  ?? Monitor for signs/symptoms of adverse events (e.g., hyperglycemia, hyperkalemia, hypomagnesemia, hypertension, headache, tremor)    Results:   Tacrolimus level: 5.2 ng/ml, drawn appropriately    Pharmacokinetic Considerations and Significant Drug Interactions:  ??? Concurrent hepatotoxic medications: None identified  ??? Concurrent CYP3A4 substrates/inhibitors: None identified  ??? Concurrent nephrotoxic medications: Bactrim    Assessment/Plan:  Recommendedation(s)  ??? Increase Envarsus to 9 mg daily    Follow-up  ??? Next level has been ordered on 02/13/21 at 0600.   ??? A pharmacist will continue to monitor and recommend levels as appropriate    Please page service pharmacist with questions/clarifications.    Vertis Kelch, PharmD

## 2021-02-12 DIAGNOSIS — Z940 Kidney transplant status: Principal | ICD-10-CM

## 2021-02-12 LAB — CBC
HEMATOCRIT: 28.8 % — ABNORMAL LOW (ref 39.0–48.0)
HEMOGLOBIN: 9.5 g/dL — ABNORMAL LOW (ref 12.9–16.5)
MEAN CORPUSCULAR HEMOGLOBIN CONC: 33 g/dL (ref 32.0–36.0)
MEAN CORPUSCULAR HEMOGLOBIN: 28.1 pg (ref 25.9–32.4)
MEAN CORPUSCULAR VOLUME: 85.3 fL (ref 77.6–95.7)
MEAN PLATELET VOLUME: 8.5 fL (ref 6.8–10.7)
PLATELET COUNT: 188 10*9/L (ref 150–450)
RED BLOOD CELL COUNT: 3.37 10*12/L — ABNORMAL LOW (ref 4.26–5.60)
RED CELL DISTRIBUTION WIDTH: 16.3 % — ABNORMAL HIGH (ref 12.2–15.2)
WBC ADJUSTED: 7.2 10*9/L (ref 3.6–11.2)

## 2021-02-12 LAB — PHOSPHORUS: PHOSPHORUS: 3.7 mg/dL (ref 2.4–5.1)

## 2021-02-12 LAB — BASIC METABOLIC PANEL
ANION GAP: 9 mmol/L (ref 5–14)
BLOOD UREA NITROGEN: 75 mg/dL — ABNORMAL HIGH (ref 9–23)
BUN / CREAT RATIO: 21
CALCIUM: 8.8 mg/dL (ref 8.7–10.4)
CHLORIDE: 109 mmol/L — ABNORMAL HIGH (ref 98–107)
CO2: 20 mmol/L (ref 20.0–31.0)
CREATININE: 3.63 mg/dL — ABNORMAL HIGH
EGFR CKD-EPI (2021) MALE: 19 mL/min/{1.73_m2} — ABNORMAL LOW (ref >=60)
GLUCOSE RANDOM: 187 mg/dL — ABNORMAL HIGH (ref 70–179)
POTASSIUM: 4.9 mmol/L — ABNORMAL HIGH (ref 3.4–4.8)
SODIUM: 138 mmol/L (ref 135–145)

## 2021-02-12 LAB — MAGNESIUM: MAGNESIUM: 2 mg/dL (ref 1.6–2.6)

## 2021-02-12 MED ADMIN — carvediloL (COREG) tablet 37.5 mg: 37.5 mg | ORAL | @ 03:00:00

## 2021-02-12 MED ADMIN — cetirizine (ZyrTEC) tablet 10 mg: 10 mg | ORAL | @ 03:00:00

## 2021-02-12 MED ADMIN — tacrolimus (ENVARSUS XR) extended release tablet 9 mg: 9 mg | ORAL | @ 13:00:00

## 2021-02-12 MED ADMIN — glecaprevir-pibrentasvir (MAVYRET) tablet 3 tablet **Patient Supplied**: 3 | ORAL | @ 13:00:00

## 2021-02-12 MED ADMIN — heparin (porcine) 5,000 unit/mL injection 7,500 Units: 7500 [IU] | SUBCUTANEOUS | @ 18:00:00

## 2021-02-12 MED ADMIN — heparin (porcine) 5,000 unit/mL injection 7,500 Units: 7500 [IU] | SUBCUTANEOUS | @ 12:00:00

## 2021-02-12 MED ADMIN — acyclovir (ZOVIRAX) capsule 400 mg: 400 mg | ORAL | @ 13:00:00

## 2021-02-12 MED ADMIN — levothyroxine (SYNTHROID) tablet 75 mcg: 75 ug | ORAL | @ 12:00:00

## 2021-02-12 MED ADMIN — mycophenolate (MYFORTIC) EC tablet 540 mg: 540 mg | ORAL | @ 03:00:00

## 2021-02-12 MED ADMIN — insulin lispro (HumaLOG) injection 0-20 Units: 0-20 [IU] | SUBCUTANEOUS | @ 18:00:00

## 2021-02-12 MED ADMIN — magnesium oxide-Mg AA chelate (Magnesium Plus Protein) 1 tablet: 1 | ORAL | @ 03:00:00

## 2021-02-12 MED ADMIN — insulin lispro (HumaLOG) injection 0-20 Units: 0-20 [IU] | SUBCUTANEOUS | @ 13:00:00

## 2021-02-12 MED ADMIN — magnesium oxide-Mg AA chelate (Magnesium Plus Protein) 1 tablet: 1 | ORAL | @ 14:00:00

## 2021-02-12 MED ADMIN — insulin lispro (HumaLOG) injection 0-20 Units: 0-20 [IU] | SUBCUTANEOUS | @ 03:00:00

## 2021-02-12 MED ADMIN — insulin glargine (LANTUS) injection 32 Units: 32 [IU] | SUBCUTANEOUS | @ 03:00:00

## 2021-02-12 MED ADMIN — pravastatin (PRAVACHOL) tablet 40 mg: 40 mg | ORAL | @ 13:00:00

## 2021-02-12 MED ADMIN — amLODIPine (NORVASC) tablet 10 mg: 10 mg | ORAL | @ 13:00:00

## 2021-02-12 MED ADMIN — chlorthalidone (HYGROTON) tablet 50 mg: 50 mg | ORAL | @ 12:00:00

## 2021-02-12 MED ADMIN — carvediloL (COREG) tablet 37.5 mg: 37.5 mg | ORAL | @ 13:00:00

## 2021-02-12 MED ADMIN — heparin (porcine) 5,000 unit/mL injection 7,500 Units: 7500 [IU] | SUBCUTANEOUS | @ 03:00:00

## 2021-02-12 MED ADMIN — acyclovir (ZOVIRAX) capsule 400 mg: 400 mg | ORAL | @ 03:00:00

## 2021-02-12 MED ADMIN — insulin lispro (HumaLOG) injection 10 Units: 10 [IU] | SUBCUTANEOUS | @ 13:00:00 | Stop: 2021-02-12

## 2021-02-12 MED ADMIN — mycophenolate (MYFORTIC) EC tablet 540 mg: 540 mg | ORAL | @ 13:00:00

## 2021-02-12 MED ADMIN — sulfamethoxazole-trimethoprim (BACTRIM) 400-80 mg tablet 80 mg of trimethoprim: 1 | ORAL | @ 13:00:00 | Stop: 2021-02-21

## 2021-02-12 NOTE — Unmapped (Signed)
Patient denies any pain, vs are stable. He had urine output overnight, ambulates to the bathroom with no issue, will continue to monitor.    Problem: Adult Inpatient Plan of Care  Goal: Plan of Care Review  Outcome: Ongoing - Unchanged  Goal: Patient-Specific Goal (Individualized)  Outcome: Ongoing - Unchanged  Goal: Absence of Hospital-Acquired Illness or Injury  Outcome: Ongoing - Unchanged  Intervention: Identify and Manage Fall Risk  Recent Flowsheet Documentation  Taken 02/11/2021 2000 by Valentino Nose, RN  Safety Interventions:   fall reduction program maintained   low bed   no IV/BP/blood draw left arm   nonskid shoes/slippers when out of bed  Goal: Optimal Comfort and Wellbeing  Outcome: Ongoing - Unchanged  Goal: Readiness for Transition of Care  Outcome: Ongoing - Unchanged  Goal: Rounds/Family Conference  Outcome: Ongoing - Unchanged     Problem: Impaired Wound Healing  Goal: Optimal Wound Healing  Outcome: Ongoing - Unchanged     Problem: Fluid and Electrolyte Imbalance (Acute Kidney Injury/Impairment)  Goal: Fluid and Electrolyte Balance  Outcome: Ongoing - Unchanged     Problem: Oral Intake Inadequate (Acute Kidney Injury/Impairment)  Goal: Optimal Nutrition Intake  Outcome: Ongoing - Unchanged     Problem: Renal Function Impairment (Acute Kidney Injury/Impairment)  Goal: Effective Renal Function  Outcome: Ongoing - Unchanged     Problem: Breathing Pattern Ineffective  Goal: Effective Breathing Pattern  Outcome: Ongoing - Unchanged  Intervention: Promote Improved Breathing Pattern  Recent Flowsheet Documentation  Taken 02/11/2021 2000 by Valentino Nose, RN  Head of Bed (HOB) Positioning: HOB at 20-30 degrees     Problem: Diabetes Comorbidity  Goal: Blood Glucose Level Within Targeted Range  Outcome: Ongoing - Unchanged     Problem: Hypertension Comorbidity  Goal: Blood Pressure in Desired Range  Outcome: Ongoing - Unchanged

## 2021-02-12 NOTE — Unmapped (Signed)
Nephrology ([facility]) Progress Note    Assessment & Plan:   Nicholas Strong is a 55 y.o. male with a PMHx of ESRD 2/2 DM2 s/p DDKT (11/2020), RCC s/p Left nephrectomy, HTN,  Polyneuropathy,??Renal Stone,??and   kidney allotransplant on 12/04/2020 that presented to [facility] with rising creatinine (3.6 from nadir 2.3) post-transplant.    Principal Problem:    Kidney transplant status, cadaveric  Active Problems:    ESRD (end stage renal disease) on dialysis (CMS-HCC)    Type 2 diabetes mellitus (CMS-HCC)    Benign essential hypertension  Resolved Problems:    * No resolved hospital problems. *    AKI I S/p DDKT 12/04/20    Outpatient nephrologist Dr. Elvera Maria.  Underwent DD KT 11/2020. Patient initially presented for renal biopsy following rising creatinine and worsening hypertension in outpatient clinic. 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 CMV viral load not detected. Post transplant nadir creatinine 2.3. Initilally concerned for rejection, but biopsy on 02/07/21 not consistent with this. S/p 500mg  solumedrol for 3 days (11/9 - 11/11) without any improvement. Creatinine increased to 4.38 on 11/12. Renal Doppler ultrasound concerning for renal artery stenosis.  Work-up    - Follow-up HLA DSA Post Transplant panel from 11/10 - in process  - Follow up MRA pelvis w/o contrast to assess renal artery stenosis   - Follow-up EBV PCR quant - in processo   Treatment   - Continue home envarsus; pharmacy to dose  - Continue home myocophenolate 540mg  BID  - Continue home bactrim MWF, acyclovir 400mg  BID, Mavyret patient provided  - Continue to hold Lasix with improving creatinine  - Strict I&O  ??  ??T2DM:   Increased while on steroids, pleated 3 days of 500 mg of Solu-Medrol on 11/11.   -Transition back to home dosing as able  - Basal: nightly glargine 32U (home 50U nightly)  - Meal: lispro 6U ACHS (home 3 units BID with breakfast, lunch)  - SSI    Hypertension:   Improved with increased carvedilol systolics averaging 170s.  - Continue carvedilol 37.5mg  BID (home dose was 25mg  BID)  - Continue home chlorthalidone 25mg  daily   - Continue amlodipine 10mg  daily    Chronic Conditions  Allergic Rhinitis: Continue home cetirizine, PRN afrin  Hypothyroidism: Continue synthroid 75mg  daily  CAD: pravastatin 80mg  daily in place of home rosuvastatin 10mg  daily; ASA 81mg  daily held in setting of renal biopsy as above; resume as tolerated atorvastatin to pravastatin to decrease the interaction with the mavyret   Hepatitis C: Continue home Mavyret (patient provided)  OSA: Continue home CPAP    Daily Checklist:  Diet: Diabetic Diet  DVT PPx: Heparin 7500units q8h  Electrolytes: No Repletion Needed  Code Status: Full Code  Dispo: Goal Discharge: determine if regecting kidney then treat    Team Contact Information:   Primary Team: Nephrology ([facility])  Primary Resident: Monia Pouch, MD PhD  Resident's Pager: [phone] (Geriatrics Intern - Cyndee Brightly)    Interval History:   No acute events overnight.  Today patient is waiting to get MRI of pelvis to assess renal artery stenosis.  He is lying in his room in the dark sleeping during the day.  He reports not feeling well and that he had this same feeling last Sunday.  He describes it as lethargy and he thinks is related to some medication.    Objective:   Temp:  [36.3 ??C (97.3 ??F)-36.8 ??C (98.2 ??F)] 36.8 ??C (98.2 ??F)  Heart  Rate:  [79-90] 79  Resp:  [17-18] 18  BP: (174-178)/(69-75) 174/69  SpO2:  [98 %-100 %] 100 %    Gen: WDWN male in NAD, answers questions appropriately and resting comfortably  Heart: RRR, S1, S2, warm, 1-2+ LLE edema  Lungs: CPAP in place. TAB, no wheezes, no use of accessory muscles  Skin:  No rashes, lesions on clothed exam  Psych: Alert, oriented, normal mood and affect.     Labs/Studies: Labs and Studies from the last 24hrs per EMR and Reviewed    Monia Pouch, MD PhD  PGY1  Dept Internal Medicine      *Portions of this record have been created using Dragon dictation software. While dictation errors have been sought, it is possible that there are errors that have not been identified prior to signing this note.

## 2021-02-13 LAB — CBC
HEMATOCRIT: 28.7 % — ABNORMAL LOW (ref 39.0–48.0)
HEMOGLOBIN: 9.2 g/dL — ABNORMAL LOW (ref 12.9–16.5)
MEAN CORPUSCULAR HEMOGLOBIN CONC: 32 g/dL (ref 32.0–36.0)
MEAN CORPUSCULAR HEMOGLOBIN: 27.4 pg (ref 25.9–32.4)
MEAN CORPUSCULAR VOLUME: 85.5 fL (ref 77.6–95.7)
MEAN PLATELET VOLUME: 9 fL (ref 6.8–10.7)
PLATELET COUNT: 169 10*9/L (ref 150–450)
RED BLOOD CELL COUNT: 3.36 10*12/L — ABNORMAL LOW (ref 4.26–5.60)
RED CELL DISTRIBUTION WIDTH: 16.3 % — ABNORMAL HIGH (ref 12.2–15.2)
WBC ADJUSTED: 8.5 10*9/L (ref 3.6–11.2)

## 2021-02-13 LAB — BASIC METABOLIC PANEL
ANION GAP: 8 mmol/L (ref 5–14)
BLOOD UREA NITROGEN: 77 mg/dL — ABNORMAL HIGH (ref 9–23)
BUN / CREAT RATIO: 24
CALCIUM: 8.9 mg/dL (ref 8.7–10.4)
CHLORIDE: 108 mmol/L — ABNORMAL HIGH (ref 98–107)
CO2: 21 mmol/L (ref 20.0–31.0)
CREATININE: 3.24 mg/dL — ABNORMAL HIGH
EGFR CKD-EPI (2021) MALE: 22 mL/min/{1.73_m2} — ABNORMAL LOW (ref >=60)
GLUCOSE RANDOM: 205 mg/dL — ABNORMAL HIGH (ref 70–179)
POTASSIUM: 4.9 mmol/L — ABNORMAL HIGH (ref 3.4–4.8)
SODIUM: 137 mmol/L (ref 135–145)

## 2021-02-13 LAB — EBV QUANTITATIVE PCR, BLOOD: EBV VIRAL LOAD RESULT: NOT DETECTED

## 2021-02-13 LAB — MAGNESIUM: MAGNESIUM: 2 mg/dL (ref 1.6–2.6)

## 2021-02-13 LAB — PHOSPHORUS: PHOSPHORUS: 3.2 mg/dL (ref 2.4–5.1)

## 2021-02-13 MED ADMIN — magnesium oxide-Mg AA chelate (Magnesium Plus Protein) 1 tablet: 1 | ORAL | @ 14:00:00

## 2021-02-13 MED ADMIN — insulin lispro (HumaLOG) injection 0-20 Units: 0-20 [IU] | SUBCUTANEOUS | @ 18:00:00

## 2021-02-13 MED ADMIN — acyclovir (ZOVIRAX) capsule 400 mg: 400 mg | ORAL | @ 14:00:00

## 2021-02-13 MED ADMIN — acyclovir (ZOVIRAX) capsule 400 mg: 400 mg | ORAL | @ 02:00:00

## 2021-02-13 MED ADMIN — insulin lispro (HumaLOG) injection 6 Units: 6 [IU] | SUBCUTANEOUS | @ 01:00:00

## 2021-02-13 MED ADMIN — levothyroxine (SYNTHROID) tablet 75 mcg: 75 ug | ORAL | @ 11:00:00

## 2021-02-13 MED ADMIN — carvediloL (COREG) tablet 37.5 mg: 37.5 mg | ORAL | @ 14:00:00

## 2021-02-13 MED ADMIN — heparin (porcine) 5,000 unit/mL injection 7,500 Units: 7500 [IU] | SUBCUTANEOUS | @ 02:00:00

## 2021-02-13 MED ADMIN — glecaprevir-pibrentasvir (MAVYRET) tablet 3 tablet **Patient Supplied**: 3 | ORAL | @ 14:00:00

## 2021-02-13 MED ADMIN — mycophenolate (MYFORTIC) EC tablet 540 mg: 540 mg | ORAL | @ 14:00:00

## 2021-02-13 MED ADMIN — cetirizine (ZyrTEC) tablet 10 mg: 10 mg | ORAL | @ 02:00:00

## 2021-02-13 MED ADMIN — insulin glargine (LANTUS) injection 32 Units: 32 [IU] | SUBCUTANEOUS | @ 03:00:00

## 2021-02-13 MED ADMIN — heparin (porcine) 5,000 unit/mL injection 7,500 Units: 7500 [IU] | SUBCUTANEOUS | @ 11:00:00

## 2021-02-13 MED ADMIN — amLODIPine (NORVASC) tablet 10 mg: 10 mg | ORAL | @ 14:00:00

## 2021-02-13 MED ADMIN — insulin lispro (HumaLOG) injection 0-20 Units: 0-20 [IU] | SUBCUTANEOUS | @ 03:00:00

## 2021-02-13 MED ADMIN — magnesium oxide-Mg AA chelate (Magnesium Plus Protein) 1 tablet: 1 | ORAL | @ 02:00:00

## 2021-02-13 MED ADMIN — insulin lispro (HumaLOG) injection 6 Units: 6 [IU] | SUBCUTANEOUS | @ 19:00:00

## 2021-02-13 MED ADMIN — insulin lispro (HumaLOG) injection 6 Units: 6 [IU] | SUBCUTANEOUS | @ 15:00:00

## 2021-02-13 MED ADMIN — pravastatin (PRAVACHOL) tablet 40 mg: 40 mg | ORAL | @ 14:00:00

## 2021-02-13 MED ADMIN — melatonin tablet 3 mg: 3 mg | ORAL | @ 02:00:00

## 2021-02-13 MED ADMIN — chlorthalidone (HYGROTON) tablet 50 mg: 50 mg | ORAL | @ 14:00:00

## 2021-02-13 MED ADMIN — tacrolimus (ENVARSUS XR) extended release tablet 9 mg: 9 mg | ORAL | @ 14:00:00

## 2021-02-13 MED ADMIN — insulin lispro (HumaLOG) injection 0-20 Units: 0-20 [IU] | SUBCUTANEOUS | @ 01:00:00

## 2021-02-13 MED ADMIN — carvediloL (COREG) tablet 37.5 mg: 37.5 mg | ORAL | @ 02:00:00

## 2021-02-13 MED ADMIN — mycophenolate (MYFORTIC) EC tablet 540 mg: 540 mg | ORAL | @ 02:00:00

## 2021-02-13 MED ADMIN — heparin (porcine) 5,000 unit/mL injection 7,500 Units: 7500 [IU] | SUBCUTANEOUS | @ 19:00:00

## 2021-02-13 NOTE — Unmapped (Signed)
Nephrology ([facility]) Progress Note    Assessment & Plan:   Nicholas Strong is a 55 y.o. male with a PMHx of ESRD 2/2 DM2 s/p DDKT (11/2020), RCC s/p Left nephrectomy, HTN,  Polyneuropathy,??Renal Stone,??and   kidney allotransplant on 12/04/2020 that presented to [facility] with rising creatinine (3.6 from nadir 2.3) post-transplant.    Principal Problem:    Kidney transplant status, cadaveric  Active Problems:    ESRD (end stage renal disease) on dialysis (CMS-HCC)    Type 2 diabetes mellitus (CMS-HCC)    Benign essential hypertension  Resolved Problems:    * No resolved hospital problems. *    AKI I S/p DDKT 12/04/20    Outpatient nephrologist Dr. Elvera Strong.  Underwent DD KT 11/2020. Patient initially presented for renal biopsy following rising creatinine and worsening hypertension in outpatient clinic. 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 CMV viral load not detected. Post transplant nadir creatinine 2.3. Initilally concerned for rejection, but biopsy on 02/07/21 not consistent with this. S/p 500mg  solumedrol for 3 days (11/9 - 11/11) without any improvement. Creatinine increased to 4.38 on 11/12, slowly downtrending. Renal Doppler ultrasound concerning for renal artery stenosis.   Work-up    - Follow-up HLA DSA Post Transplant panel from 11/10 - in process  - MRA pelvis w/o contrast to assess renal artery stenosis indeterminate, will discuss further with radiology if angiography is necessary   - Negative EBV PCR quant   Treatment   - Continue home envarsus; pharmacy to dose  - Continue home myocophenolate 540mg  BID  - Continue home bactrim MWF, acyclovir 400mg  BID, Mavyret patient provided  - Continue to hold Lasix with improving creatinine  - Strict I&O  ??  ??T2DM:   Increased while on steroids, compleated 3 days of 500 mg of Solu-Medrol on 11/11.   -Transition back to home dosing as able  - Basal: nightly glargine 32U (home 50U nightly)  - Meal: lispro 6U ACHS (home 3 units BID with breakfast, lunch)  - SSI    Hypertension:   Improved with increased carvedilol systolics averaging 170s.  - Continue carvedilol 37.5mg  BID (home dose was 25mg  BID)  - Continue home chlorthalidone 25mg  daily   - Continue amlodipine 10mg  daily    Chronic Conditions  Allergic Rhinitis: Continue home cetirizine, PRN afrin  Hypothyroidism: Continue synthroid 75mg  daily  CAD: pravastatin 40mg  daily in place of home rosuvastatin 10mg  daily; ASA 81mg  daily held in setting of renal biopsy as above; resume as tolerated atorvastatin to pravastatin to decrease the interaction with the mavyret   Hepatitis C: Continue home Mavyret (patient provided)  OSA: Continue home CPAP    Daily Checklist:  Diet: Diabetic Diet  DVT PPx: Heparin 7500units q8h  Electrolytes: No Repletion Needed  Code Status: Full Code  Dispo: Goal Discharge: determine if regecting kidney then treat    Team Contact Information:   Primary Team: Nephrology ([facility])  Primary Resident: [name], MD PhD  Resident's Pager: [phone] (Geriatrics Intern - Cyndee Brightly)    Interval History:   No acute events overnight.  Patient reports feeling tired and drowsy, feels it may be related to all his medications.  Otherwise denies shortness of breath, chest pain, leg swelling, nausea, vomiting, diarrhea.    Objective:   Temp:  [36.3 ??C (97.3 ??F)-36.6 ??C (97.9 ??F)] 36.3 ??C (97.3 ??F)  Heart Rate:  [72-76] 72  Resp:  [18] 18  BP: (165-178)/(67-77) 168/67  SpO2:  [98 %-99 %] 99 %  Gen: WDWN male in NAD, answers questions appropriately  Heart: RRR, S1, S2, warm, 1-2+ LLE edema  Lungs: CPAP in place. TAB, no wheezes, no use of accessory muscles  Skin:  No rashes, lesions on clothed exam  Psych: Alert, oriented, normal mood and affect.     Labs/Studies: Labs and Studies from the last 24hrs per EMR and Reviewed    *Portions of this record have been created using Scientist, clinical (histocompatibility and immunogenetics). While dictation errors have been sought, it is possible that there are errors that have not been identified prior to signing this note.

## 2021-02-13 NOTE — Unmapped (Signed)
Patient denies any pain or distress this afternoon. 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 Poss arteriogram in [facility] on 11/16.   Problem: Adult Inpatient Plan of Care  Goal: Plan of Care Review  Outcome: Ongoing - Unchanged     Problem: Impaired Wound Healing  Goal: Optimal Wound Healing  Outcome: Progressing     Problem: Renal Function Impairment (Acute Kidney Injury/Impairment)  Goal: Effective Renal Function  Outcome: Progressing     Problem: Hypertension Comorbidity  Goal: Blood Pressure in Desired Range  Outcome: Progressing     Problem: Oral Intake Inadequate (Acute Kidney Injury/Impairment)  Goal: Optimal Nutrition Intake  Outcome: Resolved

## 2021-02-13 NOTE — Unmapped (Signed)
Pt's main complaint today is feeling more tired than usual. BG levels checked and covered per protocol. Right leg prosthetic in use and pt is steady with ambulation. Home CPAP is bedside.  Off unit for pelvis MRA  Problem: Adult Inpatient Plan of Care  Goal: Plan of Care Review  Outcome: Progressing  Goal: Patient-Specific Goal (Individualized)  Outcome: Progressing  Goal: Absence of Hospital-Acquired Illness or Injury  Outcome: Progressing  Intervention: Identify and Manage Fall Risk  Recent Flowsheet Documentation  Taken 02/12/2021 0801 by Toula Moos, RN  Safety Interventions:   mobility aid   low bed   fall reduction program maintained  Intervention: Prevent and Manage VTE (Venous Thromboembolism) Risk  Recent Flowsheet Documentation  Taken 02/12/2021 0900 by Toula Moos, RN  VTE Prevention/Management: dorsiflexion/plantar flexion performed  Taken 02/12/2021 0801 by Toula Moos, RN  Activity Management: up ad lib  Goal: Optimal Comfort and Wellbeing  Outcome: Progressing  Goal: Readiness for Transition of Care  Outcome: Progressing  Goal: Rounds/Family Conference  Outcome: Progressing     Problem: Impaired Wound Healing  Goal: Optimal Wound Healing  Outcome: Progressing  Intervention: Promote Wound Healing  Recent Flowsheet Documentation  Taken 02/12/2021 0801 by Toula Moos, RN  Activity Management: up ad lib     Problem: Fluid and Electrolyte Imbalance (Acute Kidney Injury/Impairment)  Goal: Fluid and Electrolyte Balance  Outcome: Progressing     Problem: Oral Intake Inadequate (Acute Kidney Injury/Impairment)  Goal: Optimal Nutrition Intake  Outcome: Progressing     Problem: Renal Function Impairment (Acute Kidney Injury/Impairment)  Goal: Effective Renal Function  Outcome: Progressing     Problem: Breathing Pattern Ineffective  Goal: Effective Breathing Pattern  Outcome: Progressing     Problem: Diabetes Comorbidity  Goal: Blood Glucose Level Within Targeted Range  Outcome: Progressing  Intervention: Monitor and Manage Glycemia  Recent Flowsheet Documentation  Taken 02/12/2021 0801 by Toula Moos, RN  Glycemic Management:   blood glucose monitored   supplemental insulin given     Problem: Hypertension Comorbidity  Goal: Blood Pressure in Desired Range  Outcome: Progressing

## 2021-02-13 NOTE — Unmapped (Signed)
Problem: Adult Inpatient Plan of Care  Goal: Plan of Care Review  Outcome: Progressing  Goal: Patient-Specific Goal (Individualized)  Outcome: Progressing  Goal: Absence of Hospital-Acquired Illness or Injury  Outcome: Progressing  Goal: Optimal Comfort and Wellbeing  Outcome: Progressing  Goal: Readiness for Transition of Care  Outcome: Progressing  Goal: Rounds/Family Conference  Outcome: Progressing     Problem: Impaired Wound Healing  Goal: Optimal Wound Healing  Outcome: Progressing     Problem: Fluid and Electrolyte Imbalance (Acute Kidney Injury/Impairment)  Goal: Fluid and Electrolyte Balance  Outcome: Progressing     Problem: Oral Intake Inadequate (Acute Kidney Injury/Impairment)  Goal: Optimal Nutrition Intake  Outcome: Progressing     Problem: Renal Function Impairment (Acute Kidney Injury/Impairment)  Goal: Effective Renal Function  Outcome: Progressing     Problem: Breathing Pattern Ineffective  Goal: Effective Breathing Pattern  Outcome: Progressing     Problem: Diabetes Comorbidity  Goal: Blood Glucose Level Within Targeted Range  Outcome: Progressing     Problem: Hypertension Comorbidity  Goal: Blood Pressure in Desired Range  Outcome: Progressing     Problem: Impaired Wound Healing  Goal: Optimal Wound Healing  Outcome: Progressing     Problem: Fluid and Electrolyte Imbalance (Acute Kidney Injury/Impairment)  Goal: Fluid and Electrolyte Balance  Outcome: Progressing

## 2021-02-13 NOTE — Unmapped (Signed)
Patient free from falls and injury this shift. VSS. RA when awake, CPAP at night. A&O x4. No complaints of pain this shift. R BKA with prosthesis at bedside. Pt independent. Call bell and bedside table within reach. No other needs verbalized at this time. Will continue to monitor.       Problem: Adult Inpatient Plan of Care  Goal: Plan of Care Review  Outcome: Progressing  Goal: Patient-Specific Goal (Individualized)  Outcome: Progressing  Goal: Absence of Hospital-Acquired Illness or Injury  Outcome: Progressing  Goal: Optimal Comfort and Wellbeing  Outcome: Progressing  Goal: Readiness for Transition of Care  Outcome: Progressing  Goal: Rounds/Family Conference  Outcome: Progressing     Problem: Impaired Wound Healing  Goal: Optimal Wound Healing  Outcome: Progressing     Problem: Fluid and Electrolyte Imbalance (Acute Kidney Injury/Impairment)  Goal: Fluid and Electrolyte Balance  Outcome: Progressing     Problem: Oral Intake Inadequate (Acute Kidney Injury/Impairment)  Goal: Optimal Nutrition Intake  Outcome: Progressing     Problem: Renal Function Impairment (Acute Kidney Injury/Impairment)  Goal: Effective Renal Function  Outcome: Progressing     Problem: Breathing Pattern Ineffective  Goal: Effective Breathing Pattern  Outcome: Progressing     Problem: Diabetes Comorbidity  Goal: Blood Glucose Level Within Targeted Range  Outcome: Progressing     Problem: Hypertension Comorbidity  Goal: Blood Pressure in Desired Range  Outcome: Progressing

## 2021-02-13 NOTE — Unmapped (Cosign Needed)
SR[facility] Surgery New Consult Note    Requesting Attending Physician:  Clayborne Artist, MD  Service Requesting Consult:  Nephrology (MDB)  Consulting Attending: Dr. Heidi Dach  Reason for Consult: possible renal artery stenosis    Assessment/Recommendation:  Nicholas Strong is a 55 y.o. male with hx ESRD 2/2 diabetic nephropathy s/p DDKT 12/04/20. Some difficulty with BP management post operatively, re presented with AKI with concern for possible rejection. Biopsy 11/9 not consistent with rejection, patient s/p 3 day course of solumedrol. Some concern on renal U/S for renal artery stenosis.     -agree with arteriogram, balloon angioplasty would be appropriate if indicated based on results  -SRF will continue to follow      Thank you for the invitation to participate in this patient's care. We will continue to follow. Please page the [facility] surgery consult  with questions.      History of Present Illness:  Nicholas Strong is a 55 y.o. 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 Presented with AKI and admitted with Cr of 3.38, peak of 4.38 now 3.24. underwent 3 day course of solumedrol.     Biopsy negative for rejection  Renal U/S w question of renal artery stenosis. Normal perfusion throughout the kidney, with decreased resistive indices but tardus/parvus wave forms distal arcuate and segmental arteries, velocities at main anastomosis 4.2 from 3.8. . Subsequent MRA non diagnostic         Past Medical History  Past Medical History:   Diagnosis Date   ??? Cancer (CMS-HCC)    ??? Chronic kidney disease    ??? Coronary artery disease    ??? Diabetes mellitus (CMS-HCC)    ??? Disease of thyroid gland    ??? History of transfusion    ??? Hypertension        Past Surgical History  Past Surgical History:   Procedure Laterality Date   ??? BELOW KNEE LEG AMPUTATION Right    ??? EYE SURGERY     ??? heart stent       in 2005   ??? KIDNEY SURGERY Left     left kidney removed due to cancer   ??? NEPHRECTOMY TRANSPLANTED ORGAN     ??? PR TRANSPLANT,PREP CADAVER RENAL GRAFT Right 12/04/2020    Procedure: [facility] STD PREP CAD DONR RENAL ALLOGFT PRIOR TO TRNSPLNT, INCL DISSEC/REM PERINEPH FAT, DIAPH/RTPER ATTAC;  Surgeon: Gemma Payor, MD;  Location: MAIN OR [place];  Service: Transplant   ??? PR TRANSPLANTATION OF KIDNEY Right 12/04/2020    Procedure: RENAL ALLOTRANSPLANTATION, IMPLANTATION OF GRAFT; WITHOUT RECIPIENT NEPHRECTOMY;  Surgeon: Gemma Payor, MD;  Location: MAIN OR [place];  Service: Transplant   ??? TOE AMPUTATION Left     left big toe in 2018   ??? TONSILLECTOMY     ??? VASCULAR SURGERY         Allergies  Ferric citrate    Medications    has a current medication list which includes the following prescription(s): acyclovir, amlodipine, carvedilol, cetirizine, chlorthalidone, diphenhydramine, glecaprevir-pibrentasvir, insulin glargine, insulin lispro, levothyroxine, magnesium (amino acid chelate), melatonin, mycophenolate, nitroglycerin, rosuvastatin, semaglutide, sulfamethoxazole-trimethoprim, tacrolimus, acetaminophen, atorvastatin, and oxycodone-acetaminophen, and the following Facility-Administered Medications: acetaminophen, acyclovir, amlodipine, carvediloL (COREG) tablet 37.5 mg, cetirizine, chlorthalidone, dextrose, docusate sodium, glecaprevir-pibrentasvir, glucagon, glucose, heparin (porcine), insulin glargine, insulin lispro, insulin lispro, levothyroxine, magnesium oxide-mg aa chelate, melatonin, myfortic, nitroglycerin, ondansetron, oxymetazoline, pravastatin, simethicone, sulfamethoxazole-trimethoprim, and envarsus xr.    Family History  The patient's family history is not  on file..    Social History:  Social History     Tobacco Use   ??? Smoking status: Never   ??? Smokeless tobacco: Never   Vaping Use   ??? Vaping Use: Never used   Substance Use Topics   ??? Alcohol use: Not Currently   ??? Drug use: Never       Review of Systems  A 14 point ROS was conducted and was negative except for that noted in the HPI.    Objective:  BP 168/67  - Pulse 72  - Temp 36.3 ??C (97.3 ??F) (Oral)  - Resp 18  - Ht 172.7 cm (5' 8)  - Wt (!) 114.7 kg (252 lb 14.4 oz) Comment: with prosthesis - SpO2 99%  - BMI 38.45 kg/m??     General: Alert, awake, conversational  Resp:  Non labored breathing on RA  CV:  Normal rate.   Abd:  Soft, non-tender, non-distended. RLQ incision well healed.   Extremities: Non-edematous  Skin:  Warm, well-perfused. No rashes or lesions.  MSK:   Moves all four extremities.   Neuro:  Grossly intact.   Psych:  Mood and affect appropriate and congruent    Labs:   Lab Results   Component Value Date    WBC 8.5 02/13/2021    HGB 9.2 (L) 02/13/2021    HCT 28.7 (L) 02/13/2021    PLT 169 02/13/2021       Lab Results   Component Value Date    NA 137 02/13/2021    K 4.9 (H) 02/13/2021    CL 108 (H) 02/13/2021    CO2 21.0 02/13/2021    BUN 77 (H) 02/13/2021    CREATININE 3.24 (H) 02/13/2021    GLU 205 (H) 02/13/2021    CALCIUM 8.9 02/13/2021    MG 2.0 02/13/2021    PHOS 3.2 02/13/2021       Lab Results   Component Value Date    BILITOT 0.4 01/24/2021    BILIDIR 0.20 01/24/2021    PROT 6.3 01/24/2021    ALBUMIN 3.6 01/24/2021    ALT 70 (H) 01/24/2021    AST 24 01/24/2021    ALKPHOS 138 (H) 01/24/2021    GGT 14 12/03/2020       Lab Results   Component Value Date    PT 12.7 02/07/2021    INR 1.12 02/07/2021    APTT 30.9 12/03/2020       Imaging: all relevant imaging reviewed and summarized above    Carolynn Sayers, MD  PGY-3 General Surgery  [phone]

## 2021-02-14 LAB — CBC
HEMATOCRIT: 27.8 % — ABNORMAL LOW (ref 39.0–48.0)
HEMOGLOBIN: 9.1 g/dL — ABNORMAL LOW (ref 12.9–16.5)
MEAN CORPUSCULAR HEMOGLOBIN CONC: 32.6 g/dL (ref 32.0–36.0)
MEAN CORPUSCULAR HEMOGLOBIN: 28.3 pg (ref 25.9–32.4)
MEAN CORPUSCULAR VOLUME: 86.7 fL (ref 77.6–95.7)
MEAN PLATELET VOLUME: 9.1 fL (ref 6.8–10.7)
PLATELET COUNT: 145 10*9/L — ABNORMAL LOW (ref 150–450)
RED BLOOD CELL COUNT: 3.2 10*12/L — ABNORMAL LOW (ref 4.26–5.60)
RED CELL DISTRIBUTION WIDTH: 16.1 % — ABNORMAL HIGH (ref 12.2–15.2)
WBC ADJUSTED: 9.1 10*9/L (ref 3.6–11.2)

## 2021-02-14 LAB — MAGNESIUM: MAGNESIUM: 2.2 mg/dL (ref 1.6–2.6)

## 2021-02-14 LAB — BASIC METABOLIC PANEL
ANION GAP: 9 mmol/L (ref 5–14)
BLOOD UREA NITROGEN: 63 mg/dL — ABNORMAL HIGH (ref 9–23)
BUN / CREAT RATIO: 20
CALCIUM: 8.9 mg/dL (ref 8.7–10.4)
CHLORIDE: 111 mmol/L — ABNORMAL HIGH (ref 98–107)
CO2: 20 mmol/L (ref 20.0–31.0)
CREATININE: 3.12 mg/dL — ABNORMAL HIGH
EGFR CKD-EPI (2021) MALE: 23 mL/min/{1.73_m2} — ABNORMAL LOW (ref >=60)
GLUCOSE RANDOM: 129 mg/dL (ref 70–179)
POTASSIUM: 5.1 mmol/L — ABNORMAL HIGH (ref 3.4–4.8)
SODIUM: 140 mmol/L (ref 135–145)

## 2021-02-14 LAB — PHOSPHORUS: PHOSPHORUS: 3.3 mg/dL (ref 2.4–5.1)

## 2021-02-14 LAB — BK VIRUS QUANTITATIVE PCR, BLOOD: BK BLOOD RESULT: NOT DETECTED

## 2021-02-14 LAB — TACROLIMUS LEVEL, TROUGH: TACROLIMUS, TROUGH: 9.8 ng/mL (ref 5.0–15.0)

## 2021-02-14 MED ADMIN — acyclovir (ZOVIRAX) capsule 400 mg: 400 mg | ORAL | @ 14:00:00

## 2021-02-14 MED ADMIN — mycophenolate (MYFORTIC) EC tablet 540 mg: 540 mg | ORAL | @ 14:00:00

## 2021-02-14 MED ADMIN — sulfamethoxazole-trimethoprim (BACTRIM) 400-80 mg tablet 80 mg of trimethoprim: 1 | ORAL | @ 14:00:00 | Stop: 2021-02-21

## 2021-02-14 MED ADMIN — heparin (porcine) 5,000 unit/mL injection 7,500 Units: 7500 [IU] | SUBCUTANEOUS | @ 20:00:00

## 2021-02-14 MED ADMIN — insulin lispro (HumaLOG) injection 6 Units: 6 [IU] | SUBCUTANEOUS

## 2021-02-14 MED ADMIN — tacrolimus (ENVARSUS XR) extended release tablet 9 mg: 9 mg | ORAL | @ 14:00:00 | Stop: 2021-02-14

## 2021-02-14 MED ADMIN — pravastatin (PRAVACHOL) tablet 40 mg: 40 mg | ORAL | @ 14:00:00

## 2021-02-14 MED ADMIN — insulin lispro (HumaLOG) injection 0-20 Units: 0-20 [IU] | SUBCUTANEOUS

## 2021-02-14 MED ADMIN — glecaprevir-pibrentasvir (MAVYRET) tablet 3 tablet **Patient Supplied**: 3 | ORAL | @ 14:00:00

## 2021-02-14 MED ADMIN — carvediloL (COREG) tablet 37.5 mg: 37.5 mg | ORAL | @ 14:00:00

## 2021-02-14 MED ADMIN — chlorthalidone (HYGROTON) tablet 50 mg: 50 mg | ORAL | @ 12:00:00

## 2021-02-14 MED ADMIN — melatonin tablet 3 mg: 3 mg | ORAL | @ 03:00:00

## 2021-02-14 MED ADMIN — magnesium oxide-Mg AA chelate (Magnesium Plus Protein) 1 tablet: 1 | ORAL | @ 03:00:00

## 2021-02-14 MED ADMIN — amLODIPine (NORVASC) tablet 10 mg: 10 mg | ORAL | @ 14:00:00

## 2021-02-14 MED ADMIN — levothyroxine (SYNTHROID) tablet 75 mcg: 75 ug | ORAL | @ 12:00:00

## 2021-02-14 MED ADMIN — mycophenolate (MYFORTIC) EC tablet 540 mg: 540 mg | ORAL | @ 03:00:00

## 2021-02-14 MED ADMIN — cetirizine (ZyrTEC) tablet 10 mg: 10 mg | ORAL | @ 03:00:00

## 2021-02-14 MED ADMIN — carvediloL (COREG) tablet 37.5 mg: 37.5 mg | ORAL | @ 03:00:00

## 2021-02-14 MED ADMIN — heparin (porcine) 5,000 unit/mL injection 7,500 Units: 7500 [IU] | SUBCUTANEOUS | @ 12:00:00

## 2021-02-14 MED ADMIN — insulin lispro (HumaLOG) injection 6 Units: 6 [IU] | SUBCUTANEOUS | @ 23:00:00

## 2021-02-14 MED ADMIN — hydrALAZINE (APRESOLINE) tablet 10 mg: 10 mg | ORAL | @ 17:00:00

## 2021-02-14 MED ADMIN — heparin (porcine) 5,000 unit/mL injection 7,500 Units: 7500 [IU] | SUBCUTANEOUS | @ 03:00:00

## 2021-02-14 MED ADMIN — magnesium oxide-Mg AA chelate (Magnesium Plus Protein) 1 tablet: 1 | ORAL | @ 14:00:00

## 2021-02-14 MED ADMIN — insulin lispro (HumaLOG) injection 6 Units: 6 [IU] | SUBCUTANEOUS | @ 16:00:00

## 2021-02-14 MED ADMIN — acyclovir (ZOVIRAX) capsule 400 mg: 400 mg | ORAL | @ 03:00:00

## 2021-02-14 NOTE — Unmapped (Signed)
Nephrology (MEDB) Progress Note    Assessment & Plan:   Nicholas Strong is a 55 y.o. male with a PMHx of ESRD 2/2 DM2 s/p DDKT (11/2020), RCC s/p Left nephrectomy, HTN,  Polyneuropathy,??Renal Stone,??and   kidney allotransplant on 12/04/2020 that presented to [facility] with rising creatinine (3.6 from nadir 2.3) post-transplant.    Principal Problem:    Kidney transplant status, cadaveric  Active Problems:    ESRD (end stage renal disease) on dialysis (CMS-HCC)    Type 2 diabetes mellitus (CMS-HCC)    Benign essential hypertension  Resolved Problems:    * No resolved hospital problems. *    AKI I S/p DDKT 12/04/20    Outpatient nephrologist Dr. Elvera Maria.  Underwent DD KT 11/2020. Patient initially presented for renal biopsy following rising creatinine and worsening hypertension in outpatient clinic. 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 CMV viral load not detected. Post transplant nadir creatinine 2.3. Initilally concerned for rejection, but biopsy on 02/07/21 not entirely consistent with this. S/p 500mg  solumedrol for 3 days (11/9 - 11/11) without significant improvement. Creatinine increased to 4.38 on 11/12, slowly downtrending but continues to have resistant HTN. Renal Doppler ultrasound concerning for possible renal artery stenosis, however MRA unfortunately indeterminate and could not visualize the transplanted kidney vessels. Complex situation regarding safest course of action - would ideally want to spare additional contrast with CTA, though VIR feels risk of angiogram without more definitive evidence for renal artery stenosis would be too high.  After ongoing multidisciplinary discussions, given the fact that his creatinine continues to improve will elect to do the CTA as an outpatient in hopes to give the kidney time to recover.  Work-up    - Follow-up HLA DSA Post Transplant panel from 11/10 - in process  - Negative EBV PCR quant   Treatment   - Continue home envarsus; pharmacy to dose  - Continue home myocophenolate 540mg  BID  - Continue home bactrim MWF, acyclovir 400mg  BID, Mavyret patient provided  - Continue to hold Lasix with improving creatinine  - Strict I&O  ??  T2DM:   Increased while on steroids, compleated 3 days of 500 mg of Solu-Medrol on 11/11.   - Transition back to home dosing as able  - Basal: nightly glargine 35U (home 50U nightly)  - Meal: lispro 6U ACHS (home 3 units BID with breakfast, lunch)  - SSI    Resistant Hypertension:   Blood pressures have been elevated and resistant to multiple medication increases.  Slightly improved with increased carvedilol systolics averaging 170s, will add hydralazine in hopes to get blood pressures to a safe discharge level  - Continue carvedilol 37.5mg  BID (home dose was 25mg  BID)  - Add hydralazine 10mg  BID   - Continue home chlorthalidone 50mg  daily   - Continue amlodipine 10mg  daily    Chronic Conditions  Allergic Rhinitis: Continue home cetirizine, PRN afrin  Hypothyroidism: Continue synthroid 75mg  daily  CAD: pravastatin 40mg  daily in place of home rosuvastatin 10mg  daily; ASA 81mg  daily held in setting of renal biopsy as above; resume as tolerated atorvastatin to pravastatin to decrease the interaction with the mavyret   Hepatitis C: Continue home Mavyret (patient provided)  OSA: Continue home CPAP    Daily Checklist:  Diet: Diabetic Diet  DVT PPx: Heparin 7500units q8h  Electrolytes: No Repletion Needed  Code Status: Full Code  Dispo: Goal Discharge: determine if regecting kidney then treat    Team Contact Information:   Primary  Team: Nephrology (MEDB)  Primary Resident: Janann Colonel Dante Roudebush, MD PhD  Resident's Pager: [phone] (Geriatrics Intern - Cyndee Brightly)    Interval History:   No acute events overnight.  Feels okay this morning. Complaining of some leg swelling when sitting on side of bed, improved with leg elevation. Requesting special pillow for leg elevation. Frustrated with prolonged hospital course and lack of solidified plan for how to proceed. Otherwise denies abdominal pain, chest pain, SOB, N/V/D.    Objective:   Temp:  [36.3 ??C (97.3 ??F)-37.1 ??C (98.8 ??F)] 36.7 ??C (98.1 ??F)  Heart Rate:  [71-87] 71  Resp:  [17-18] 17  BP: (165-186)/(67-73) 172/69  SpO2:  [99 %-100 %] 99 %    Gen: WDWN male in NAD, answers questions appropriately, sitting on side of bed.   Heart: RRR, S1, S2 normal  Extremities: R leg amputee, L leg with dependent 1+ pitting edema  Lungs: CPAP in place. TAB, no wheezes, no use of accessory muscles  Skin:  No rashes, lesions on clothed exam  Psych: Alert, oriented, normal mood and affect.     Labs/Studies: Labs and Studies from the last 24hrs per EMR and Reviewed    *Portions of this record have been created using Scientist, clinical (histocompatibility and immunogenetics). While dictation errors have been sought, it is possible that there are errors that have not been identified prior to signing this note.

## 2021-02-14 NOTE — Unmapped (Signed)
VASCULAR & INTERVENTIONAL RADIOLOGY INPATIENT CONSULTATION     Requesting Attending Physician: Clayborne Artist, MD  Service Requesting Consult: Nephrology (MDB)    Date of Service: 02/13/2021  Consulting Interventional Radiologist: Dr. Ammie Dalton, Attending  Fellow: Epifania Gore. Rock Nephew, MD, PGY- 6     HPI:     Reason for consult: Consult for possible transplant renal arteriogram    History of Present Illness:   Nicholas Strong is a 55 y.o. male with complex history including left nephrectomy secondary to RCC and right renal transplant secondary to ESRD.  There is some concern for acute kidney injury secondary to transplant kidney renal artery stenosis.  This was confirmed confirmed at least small vessel middle renal artery stenosis with elevated renal artery velocities.  The primary team had reached out for focal renal arteriogram after MRA was inconclusive, and they wanted to save the amount of contrast dose for this patient.    Review of Systems:  Pertinent items are noted in HPI.    Medical History:     Past Medical History:  Past Medical History:   Diagnosis Date   ??? Cancer (CMS-HCC)    ??? Chronic kidney disease    ??? Coronary artery disease    ??? Diabetes mellitus (CMS-HCC)    ??? Disease of thyroid gland    ??? History of transfusion    ??? Hypertension        Surgical History:  Past Surgical History:   Procedure Laterality Date   ??? BELOW KNEE LEG AMPUTATION Right    ??? EYE SURGERY     ??? heart stent       in 2005   ??? KIDNEY SURGERY Left     left kidney removed due to cancer   ??? NEPHRECTOMY TRANSPLANTED ORGAN     ??? PR TRANSPLANT,PREP CADAVER RENAL GRAFT Right 12/04/2020    Procedure: [facility] STD PREP CAD DONR RENAL ALLOGFT PRIOR TO TRNSPLNT, INCL DISSEC/REM PERINEPH FAT, DIAPH/RTPER ATTAC;  Surgeon: Gemma Payor, MD;  Location: MAIN OR [place];  Service: Transplant   ??? PR TRANSPLANTATION OF KIDNEY Right 12/04/2020    Procedure: RENAL ALLOTRANSPLANTATION, IMPLANTATION OF GRAFT; WITHOUT RECIPIENT NEPHRECTOMY;  Surgeon: Gemma Payor, MD;  Location: MAIN OR [place];  Service: Transplant   ??? TOE AMPUTATION Left     left big toe in 2018   ??? TONSILLECTOMY     ??? VASCULAR SURGERY         Family History:  History reviewed. No pertinent family history.    Medications:   Current Facility-Administered Medications   Medication Dose Route Frequency Provider Last Rate Last Admin   ??? acetaminophen (TYLENOL) tablet 1,000 mg  1,000 mg Oral Q8H PRN Horald Pollen, MD       ??? acyclovir (ZOVIRAX) capsule 400 mg  400 mg Oral BID Horald Pollen, MD   400 mg at 02/13/21 0835   ??? amLODIPine (NORVASC) tablet 10 mg  10 mg Oral Daily Horald Pollen, MD   10 mg at 02/13/21 0835   ??? carvediloL (COREG) tablet 37.5 mg  37.5 mg Oral BID Hiram Gash, MD   37.5 mg at 02/13/21 0835   ??? cetirizine (ZyrTEC) tablet 10 mg  10 mg Oral Nightly Horald Pollen, MD   10 mg at 02/12/21 2103   ??? chlorthalidone (HYGROTON) tablet 50 mg  50 mg Oral QAM Horald Pollen, MD   50 mg at 02/13/21 0835   ??? dextrose (D10W) 10% bolus 125 mL  12.5  g Intravenous Q10 Min PRN Hilaria Ota, MD       ??? docusate sodium (COLACE) capsule 100 mg  100 mg Oral BID PRN Horald Pollen, MD       ??? glecaprevir-pibrentasvir (MAVYRET) tablet 3 tablet **Patient Supplied**  3 tablet Oral Daily Horald Pollen, MD   3 tablet at 02/13/21 0837   ??? glucagon injection 1 mg  1 mg Intramuscular Once PRN Horald Pollen, MD       ??? glucose chewable tablet 16 g  16 g Oral Q10 Min PRN Horald Pollen, MD       ??? heparin (porcine) 5,000 unit/mL injection 7,500 Units  7,500 Units Subcutaneous [facility] Horald Pollen, MD   7,500 Units at 02/13/21 1409   ??? insulin glargine (LANTUS) injection 32 Units  32 Units Subcutaneous Nightly Horald Pollen, MD   32 Units at 02/12/21 2219   ??? insulin lispro (HumaLOG) injection 0-20 Units  0-20 Units Subcutaneous ACHS Horald Pollen, MD   1 Units at 02/13/21 1307   ??? insulin lispro (HumaLOG) injection 6 Units  6 Units Subcutaneous TID AC Horald Pollen, MD   6 Units at 02/13/21 1409   ??? levothyroxine (SYNTHROID) tablet 75 mcg  75 mcg Oral daily Horald Pollen, MD   75 mcg at 02/13/21 0544   ??? magnesium oxide-Mg AA chelate (Magnesium Plus Protein) 1 tablet  1 tablet Oral BID Horald Pollen, MD   1 tablet at 02/13/21 0834   ??? melatonin tablet 3 mg  3 mg Oral QPM Horald Pollen, MD   3 mg at 02/12/21 2104   ??? mycophenolate (MYFORTIC) EC tablet 540 mg  540 mg Oral BID Horald Pollen, MD   540 mg at 02/13/21 0835   ??? nitroglycerin (NITROSTAT) SL tablet 0.4 mg  0.4 mg Sublingual Q5 Min PRN Horald Pollen, MD       ??? ondansetron (ZOFRAN-ODT) disintegrating tablet 4 mg  4 mg Oral Q8H PRN Horald Pollen, MD       ??? oxymetazoline (AFRIN) 0.05 % nasal spray 3 spray  3 spray Each Nare BID PRN Loma Newton, MD       ??? pravastatin (PRAVACHOL) tablet 40 mg  40 mg Oral Daily Horald Pollen, MD   40 mg at 02/13/21 0835   ??? simethicone (MYLICON) chewable tablet 80 mg  80 mg Oral QID PRN Horald Pollen, MD   80 mg at 02/11/21 1307   ??? sulfamethoxazole-trimethoprim (BACTRIM) 400-80 mg tablet 80 mg of trimethoprim  1 tablet Oral Once per day on Mon Wed Fri Cecilia B Levandowski, MD   80 mg of trimethoprim at 02/12/21 0816   ??? tacrolimus (ENVARSUS XR) extended release tablet 9 mg  9 mg Oral Daily Horald Pollen, MD   9 mg at 02/13/21 1324       Allergies:  Ferric citrate    Social History:  Social History     Tobacco Use   ??? Smoking status: Never   ??? Smokeless tobacco: Never   Vaping Use   ??? Vaping Use: Never used   Substance Use Topics   ??? Alcohol use: Not Currently   ??? Drug use: Never       Objective:      Vital Signs:  Temp:  [36.3 ??C (97.3 ??F)-36.6 ??C (97.9 ??F)] 36.3 ??C (97.3 ??F)  Heart Rate:  [72-76] 72  Resp:  [18] 18  BP: (  165-178)/(67-77) 168/67  MAP (mmHg):  [95-103] 101  SpO2:  [98 %-99 %] 99 %    Physical Exam:  Physical Exam:    Vitals:    02/13/21 1300 BP: 168/67   Pulse:    Resp:    Temp:    SpO2:      ASA Grade: ASA 3 - Patient with moderate systemic disease with functional limitations    Airway assessment: Not applicable.    Diagnostic Studies:  MRA, day prior and renal ultrasound, with results as above.    Labs:    Recent Labs     02/11/21  0716 02/12/21  0332 02/13/21  1140   WBC 8.5 7.2 8.5   HGB 9.5* 9.5* 9.2*   HCT 28.6* 28.8* 28.7*   PLT 181 188 169     Recent Labs     02/11/21  0716 02/12/21  0332 02/13/21  1140   NA 138 138 137   K 4.8 4.9* 4.9*   CL 107 109* 108*   BUN 91* 75* 77*   CREATININE 4.26* 3.63* 3.24*   GLU 230* 187* 205*     No results for input(s): PROT, ALBUMIN, AST, ALT, ALKPHOS, BILITOT in the last 72 hours.    Invalid input(s):  BILIDIR  No results for input(s): INR, APTT, FIBRINOGEN in the last 72 hours.      Assessment and Recommendations:     Mr. Crisco is a 55 y.o. male concern for right pelvic transplant renal arterial stenosis suggested on renal ultrasound.  Primary team has reached out secondary to transplant surgery recommendations requesting transplant renal arteriogram.  Velocity ultrasound is more sensitive and specific regarding stenosis versus any cross-sectional imaging, and if the main renal transplant artery did show stenosis we would need CTA of the abdomen to confirm this before going in for balloon angioplasty.  However, diagnostic angiogram alone is not indicated as there is too much risk associated with an invasive procedure without knowing for certain that there is main renal transplant arterial stenosis.    Recommendations:  - Proceed with CTA of the abdomen pelvis to investigate possible main transplant renal artery stenosis.  If this is positive we will be glad to proceed with an arteriogram for possible angioplasty    The patient was discussed with  Dr. Ammie Dalton. We have also communicated our assessment and recommendations with the primary team, and they are amenable to the plan.      Thank you for involving [place] in the care of this patient. Please page the VIR consult pager [phone]) with further questions, concerns, or if new issues arise.    Signed:  Epifania Gore. Rock Nephew, MD, VIR Fellow  Department of VIR/Radiology, [facility]

## 2021-02-14 NOTE — Unmapped (Signed)
Problem: Adult Inpatient Plan of Care  Goal: Plan of Care Review  Outcome: Progressing  Goal: Absence of Hospital-Acquired Illness or Injury  Outcome: Progressing  Goal: Optimal Comfort and Wellbeing  Outcome: Progressing  Goal: Readiness for Transition of Care  Outcome: Progressing     Problem: Impaired Wound Healing  Goal: Optimal Wound Healing  Outcome: Progressing     Problem: Fluid and Electrolyte Imbalance (Acute Kidney Injury/Impairment)  Goal: Fluid and Electrolyte Balance  Outcome: Progressing     Problem: Renal Function Impairment (Acute Kidney Injury/Impairment)  Goal: Effective Renal Function  Outcome: Progressing     Problem: Breathing Pattern Ineffective  Goal: Effective Breathing Pattern  Outcome: Progressing     Problem: Diabetes Comorbidity  Goal: Blood Glucose Level Within Targeted Range  Outcome: Progressing     Problem: Hypertension Comorbidity  Goal: Blood Pressure in Desired Range  Outcome: Progressing

## 2021-02-14 NOTE — Unmapped (Signed)
Tacrolimus Therapeutic Monitoring Pharmacy Note    Nicholas Strong is a 55 y.o. male continuing tacrolimus.     Indication: Kidney transplant     Date of Transplant: 12/04/20      Prior Dosing Information: Home regimen Envarsus 7 mg daily     Goals:  Therapeutic Drug Levels  Tacrolimus trough goal: 8-10 ng/mL    Additional Clinical Monitoring/Outcomes  ?? Monitor renal function (SCr and urine output) and liver function (LFTs)  ?? Monitor for signs/symptoms of adverse events (e.g., hyperglycemia, hyperkalemia, hypomagnesemia, hypertension, headache, tremor)    Results:   Tacrolimus level: 9.8 ng/ml, drawn appropriately    Pharmacokinetic Considerations and Significant Drug Interactions:  ??? Concurrent hepatotoxic medications: None identified  ??? Concurrent CYP3A4 substrates/inhibitors: None identified  ??? Concurrent nephrotoxic medications: Bactrim    Assessment/Plan:  Recommendedation(s)  ??? After discussion with team will reduce regimen to Envarsus to 8 mg daily    Follow-up  ??? Next level has been ordered on 02/16/21 at 0600.   ??? A pharmacist will continue to monitor and recommend levels as appropriate    Please page service pharmacist with questions/clarifications.    Janell Quiet, PharmD

## 2021-02-14 NOTE — Unmapped (Signed)
[facility] Surgery Consult Note    Requesting Attending Physician:  Clayborne Artist, MD  Service Requesting Consult:  Nephrology (MDB)  Consulting Attending: Dr. Heidi Dach  Reason for Consult: possible renal artery stenosis    Interval events  -downtrending Cr this morning 3.12(3.24). Doing well otherwise.     Assessment/Recommendation:  Nicholas Strong is a 55 y.o. male with hx ESRD 2/2 diabetic nephropathy s/p DDKT 12/04/20. Some difficulty with BP management post operatively, re presented with AKI with concern for possible rejection. Biopsy 11/9 not consistent with rejection, patient s/p 3 day course of solumedrol. Some concern on renal U/S for renal artery stenosis. Given improving Cr, would advise continuing to trend labs until Cr nadir is reached, and determining at that time the need for CTA and possible arteriogram.    Thank you for the invitation to participate in this patient's care.  Please page the [facility] surgery consult  with questions.      Objective:  BP 172/69  - Pulse 71  - Temp 36.7 ??C (98.1 ??F) (Oral)  - Resp 17  - Ht 172.7 cm (5' 8)  - Wt (!) 114.7 kg (252 lb 14.4 oz) Comment: with prosthesis - SpO2 99%  - BMI 38.45 kg/m??     General: Sitting comfortably on side of bed, eating    Labs:   Lab Results   Component Value Date    WBC 9.1 02/14/2021    HGB 9.1 (L) 02/14/2021    HCT 27.8 (L) 02/14/2021    PLT 145 (L) 02/14/2021       Lab Results   Component Value Date    NA 140 02/14/2021    K 5.1 (H) 02/14/2021    CL 111 (H) 02/14/2021    CO2 20.0 02/14/2021    BUN 63 (H) 02/14/2021    CREATININE 3.12 (H) 02/14/2021    GLU 129 02/14/2021    CALCIUM 8.9 02/14/2021    MG 2.2 02/14/2021    PHOS 3.3 02/14/2021       Lab Results   Component Value Date    BILITOT 0.4 01/24/2021    BILIDIR 0.20 01/24/2021    PROT 6.3 01/24/2021    ALBUMIN 3.6 01/24/2021    ALT 70 (H) 01/24/2021    AST 24 01/24/2021    ALKPHOS 138 (H) 01/24/2021    GGT 14 12/03/2020       Lab Results   Component Value Date    PT 12.7 02/07/2021 INR 1.12 02/07/2021    APTT 30.9 12/03/2020       Imaging: all relevant imaging reviewed and summarized above    Carolynn Sayers, MD  PGY-3 General Surgery  [phone]

## 2021-02-15 LAB — PHOSPHORUS: PHOSPHORUS: 3.5 mg/dL (ref 2.4–5.1)

## 2021-02-15 LAB — BASIC METABOLIC PANEL
ANION GAP: 11 mmol/L (ref 5–14)
BLOOD UREA NITROGEN: 71 mg/dL — ABNORMAL HIGH (ref 9–23)
BUN / CREAT RATIO: 21
CALCIUM: 8.9 mg/dL (ref 8.7–10.4)
CHLORIDE: 110 mmol/L — ABNORMAL HIGH (ref 98–107)
CO2: 20 mmol/L (ref 20.0–31.0)
CREATININE: 3.42 mg/dL — ABNORMAL HIGH
EGFR CKD-EPI (2021) MALE: 20 mL/min/{1.73_m2} — ABNORMAL LOW (ref >=60)
GLUCOSE RANDOM: 146 mg/dL (ref 70–179)
POTASSIUM: 5.2 mmol/L — ABNORMAL HIGH (ref 3.4–4.8)
SODIUM: 141 mmol/L (ref 135–145)

## 2021-02-15 LAB — CBC
HEMATOCRIT: 27.7 % — ABNORMAL LOW (ref 39.0–48.0)
HEMOGLOBIN: 8.7 g/dL — ABNORMAL LOW (ref 12.9–16.5)
MEAN CORPUSCULAR HEMOGLOBIN CONC: 31.5 g/dL — ABNORMAL LOW (ref 32.0–36.0)
MEAN CORPUSCULAR HEMOGLOBIN: 27.2 pg (ref 25.9–32.4)
MEAN CORPUSCULAR VOLUME: 86.3 fL (ref 77.6–95.7)
MEAN PLATELET VOLUME: 9.1 fL (ref 6.8–10.7)
PLATELET COUNT: 157 10*9/L (ref 150–450)
RED BLOOD CELL COUNT: 3.21 10*12/L — ABNORMAL LOW (ref 4.26–5.60)
RED CELL DISTRIBUTION WIDTH: 16.4 % — ABNORMAL HIGH (ref 12.2–15.2)
WBC ADJUSTED: 8.7 10*9/L (ref 3.6–11.2)

## 2021-02-15 LAB — MAGNESIUM: MAGNESIUM: 2.1 mg/dL (ref 1.6–2.6)

## 2021-02-15 MED ORDER — TACROLIMUS XR 1 MG TABLET,EXTENDED RELEASE 24 HR
ORAL_TABLET | Freq: Every day | ORAL | 8 refills | 30 days | Status: CP
Start: 2021-02-15 — End: ?

## 2021-02-15 MED ORDER — HYDRALAZINE 25 MG TABLET
ORAL_TABLET | Freq: Three times a day (TID) | ORAL | 0 refills | 90.00000 days | Status: CP
Start: 2021-02-15 — End: 2021-02-15
  Filled 2021-02-15: qty 90, 30d supply, fill #0

## 2021-02-15 MED ORDER — DOCUSATE SODIUM 100 MG CAPSULE
ORAL_CAPSULE | Freq: Two times a day (BID) | ORAL | 11 refills | 30 days | Status: CP | PRN
Start: 2021-02-15 — End: 2021-03-17
  Filled 2021-02-16: qty 60, 30d supply, fill #0

## 2021-02-15 MED ORDER — CARVEDILOL 12.5 MG TABLET
ORAL_TABLET | Freq: Two times a day (BID) | ORAL | 0 refills | 30 days | Status: CP
Start: 2021-02-15 — End: 2021-03-17
  Filled 2021-02-16: qty 180, 30d supply, fill #0

## 2021-02-15 MED ORDER — INSULIN GLARGINE (U-100) 100 UNIT/ML (3 ML) SUBCUTANEOUS PEN
Freq: Every evening | SUBCUTANEOUS | 0 refills | 42 days | Status: CP
Start: 2021-02-15 — End: 2021-03-17
  Filled 2021-03-06: qty 15, 42d supply, fill #0

## 2021-02-15 MED ADMIN — amLODIPine (NORVASC) tablet 10 mg: 10 mg | ORAL | @ 14:00:00 | Stop: 2021-02-15

## 2021-02-15 MED ADMIN — carvediloL (COREG) tablet 37.5 mg: 37.5 mg | ORAL | @ 14:00:00 | Stop: 2021-02-15

## 2021-02-15 MED ADMIN — carvediloL (COREG) tablet 37.5 mg: 37.5 mg | ORAL | @ 02:00:00

## 2021-02-15 MED ADMIN — chlorthalidone (HYGROTON) tablet 50 mg: 50 mg | ORAL | @ 12:00:00 | Stop: 2021-02-15

## 2021-02-15 MED ADMIN — mycophenolate (MYFORTIC) EC tablet 540 mg: 540 mg | ORAL | @ 02:00:00

## 2021-02-15 MED ADMIN — heparin (porcine) 5,000 unit/mL injection 7,500 Units: 7500 [IU] | SUBCUTANEOUS | @ 02:00:00

## 2021-02-15 MED ADMIN — tacrolimus (ENVARSUS XR) extended release tablet 8 mg: 8 mg | ORAL | @ 14:00:00 | Stop: 2021-02-15

## 2021-02-15 MED ADMIN — hydrALAZINE (APRESOLINE) tablet 10 mg: 10 mg | ORAL | @ 02:00:00

## 2021-02-15 MED ADMIN — levothyroxine (SYNTHROID) tablet 75 mcg: 75 ug | ORAL | @ 12:00:00 | Stop: 2021-02-15

## 2021-02-15 MED ADMIN — heparin (porcine) 5,000 unit/mL injection 7,500 Units: 7500 [IU] | SUBCUTANEOUS | @ 12:00:00 | Stop: 2021-02-15

## 2021-02-15 MED ADMIN — insulin lispro (HumaLOG) injection 0-20 Units: 0-20 [IU] | SUBCUTANEOUS | @ 18:00:00 | Stop: 2021-02-15

## 2021-02-15 MED ADMIN — magnesium oxide-Mg AA chelate (Magnesium Plus Protein) 1 tablet: 1 | ORAL | @ 14:00:00 | Stop: 2021-02-15

## 2021-02-15 MED ADMIN — pravastatin (PRAVACHOL) tablet 40 mg: 40 mg | ORAL | @ 14:00:00 | Stop: 2021-02-15

## 2021-02-15 MED ADMIN — insulin glargine (LANTUS) injection 35 Units: 35 [IU] | SUBCUTANEOUS | @ 02:00:00

## 2021-02-15 MED ADMIN — hydrALAZINE (APRESOLINE) tablet 25 mg: 25 mg | ORAL | @ 18:00:00 | Stop: 2021-02-15

## 2021-02-15 MED ADMIN — glecaprevir-pibrentasvir (MAVYRET) tablet 3 tablet **Patient Supplied**: 3 | ORAL | @ 14:00:00 | Stop: 2021-02-15

## 2021-02-15 MED ADMIN — acyclovir (ZOVIRAX) capsule 400 mg: 400 mg | ORAL | @ 02:00:00

## 2021-02-15 MED ADMIN — insulin lispro (HumaLOG) injection 6 Units: 6 [IU] | SUBCUTANEOUS | @ 15:00:00 | Stop: 2021-02-15

## 2021-02-15 MED ADMIN — cetirizine (ZyrTEC) tablet 10 mg: 10 mg | ORAL | @ 02:00:00

## 2021-02-15 MED ADMIN — mycophenolate (MYFORTIC) EC tablet 540 mg: 540 mg | ORAL | @ 14:00:00 | Stop: 2021-02-15

## 2021-02-15 MED ADMIN — insulin lispro (HumaLOG) injection 0-20 Units: 0-20 [IU] | SUBCUTANEOUS | @ 02:00:00

## 2021-02-15 MED ADMIN — magnesium oxide-Mg AA chelate (Magnesium Plus Protein) 1 tablet: 1 | ORAL | @ 02:00:00

## 2021-02-15 MED ADMIN — acyclovir (ZOVIRAX) capsule 400 mg: 400 mg | ORAL | @ 14:00:00 | Stop: 2021-02-15

## 2021-02-15 MED ADMIN — hydrALAZINE (APRESOLINE) tablet 10 mg: 10 mg | ORAL | @ 14:00:00 | Stop: 2021-02-15

## 2021-02-15 NOTE — Unmapped (Signed)
Physician Discharge Summary [facility]  3 [facility]  [address]  [place] [address]  Dept: [phone]  Loc: [phone]     Identifying Information:   Nicholas Strong  11/03/1965  [id number]    Primary Care Physician: Megan Salon, MD     Code Status: Full Code    Admit Date: 02/07/2021    Discharge Date: 02/15/2021     Discharge To: Home    Discharge Service: [facility] - Nephrology Floor Team (MEDB)     Discharge Attending Physician: Clayborne Artist, MD    Discharge Diagnoses:   Principal Problem:    Kidney transplant status, cadaveric POA: Not Applicable  Active Problems:    ESRD (end stage renal disease) on dialysis (CMS-HCC) POA: Not Applicable    Type 2 diabetes mellitus (CMS-HCC) POA: Yes    Benign essential hypertension POA: Yes  Resolved Problems:    * No resolved hospital problems. *      Hospital Course:   [ ]  Blood pressure medication titration  [ ]  Insulin titration     -------------------------    Nicholas Strong is a 54 y.o. male with PMHx of ESRD 2/2 DM2 s/p DDKT (11/2020), RCC s/p Left nephrectomy, HTN,  Polyneuropathy, Renal Stone, and kidney allotransplant on 12/04/2020 that presented to [facility] with rising creatinine (3.6 from nadir 2.3) and worsening hypertension.  Initially concern for rejection however biopsy not consistent with rejection.  Renal ultrasound showed possible renal artery stenosis of transplanted kidney, and unfortunately MRA was equivocal and could not visualize transplanted kidney appropriately. Would ideally want to spare additional contrast with CTA, though risk of renal arteriogram without more definitive evidence for renal artery stenosis would be too high at this time. After ongoing multidisciplinary discussions, given the fact that his creatinine continues to improve will elect to monitor closely as an outpatient and determine need for further studies (repeat MRA vs CTA) prior to arteriogram.     AKI I S/p DDKT 12/04/20    Outpatient nephrologist Dr. Elvera Maria. Underwent DD KT 11/2020. 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  Post transplant nadir creatinine 2.3. Presented for renal biopsy following rising creatinine and worsening hypertension in outpatient clinic. Initially concerned for rejection, and treatment was started with 500 mg Solu-Medrol for 3 days (11/9 - 11/11). Biopsy on 02/07/21 preliminary pathology did not show signs of rejection.  CMV viral load not detected, EBV PCR negative.  Renal ultrasound with Dopplers concerning for renal artery stenosis, but unfortunately MRA was equivocal and could not visualize transplanted kidney appropriately. Would ideally want to spare additional contrast with CTA, though risk of renal arteriogram without more definitive evidence for renal artery stenosis would be too high at this time. After ongoing multidisciplinary discussions, given the fact that his creatinine continues to improve will elect to monitor closely as an outpatient and determine need for further studies (repeat MRA vs CTA) prior to arteriogram.   Transplant medications: envarsus dose adjusted to 8 on discharge; myocophenolate 540mg  BID; bactrim MWF; acyclovir 400mg  BID; Mavyret (patient provided).     Acute Hypoxic Respiratory Failure 2/2 Volume Overload- Resolved  Patient initially presenting to unit requiring 2 L O2 via [place].  Patient reports he was advised to hold his home standing Lasix 80 mg daily for 1 week prior to renal biopsy.  Chest x-ray on presentation notable for bilateral pleural effusions concerning for pulmonary edema. Diuresed with IV Lasix and and patient was able to wean to room  air.     Resistant Hypertension  Presented because BP started rising in the outpatient setting.  Could certainly be secondary to renal artery stenosis or tacrolimus related. Discharge BP regimen: chlorthalidone 50mg  daily, amlodipine 10mg  daily, increased carvedilol from 25mg  to 37.5mg  twice daily, and hydralazine 25mg  TID.    T2DM:   Increased while on steroids, compleated 3 days of 500 mg of Solu-Medrol on 11/11. Will discharge on basal insulin of glargine 35U (home 50U nightly) and home 3 units BID with breakfast, lunch, with titration per PCP. Resume weekly ozempic.     Outpatient Provider Follow Up Issues:   [ ]  Blood pressure medication titration  [ ]  Insulin titration     Touchbase with Outpatient Provider:  Warm Handoff: Completed on 02/15/21 by Janann Colonel Swayze Kozuch  (Intern) via Face to Face    Procedures:  None  ______________________________________________________________________  Discharge Medications:      Your Medication List      STOP taking these medications    rosuvastatin 10 MG tablet  Commonly known as: CRESTOR        START taking these medications    hydrALAZINE 25 MG tablet  Commonly known as: APRESOLINE  Take 1 tablet (25 mg total) by mouth Three (3) times a day.     pravastatin 40 MG tablet  Commonly known as: PRAVACHOL  Take 1 tablet (40 mg total) by mouth daily.  Start taking on: February 16, 2021        CHANGE how you take these medications    carvediloL 12.5 MG tablet  Commonly known as: COREG  Take 3 tablets (37.5 mg total) by mouth Two (2) times a day.  What changed:   ?? medication strength  ?? how much to take     insulin glargine 100 unit/mL (3 mL) injection pen  Commonly known as: BASAGLAR, LANTUS  Inject 0.35 mL (35 Units total) under the skin nightly.  What changed: how much to take     melatonin 5 mg tablet  Take by mouth nightly as needed.  What changed: how much to take     tacrolimus 1 mg Tb24 extended release tablet  Commonly known as: ENVARSUS XR  Take 8 tablets (8 mg total) by mouth daily.  What changed: how much to take        CONTINUE taking these medications    acetaminophen 500 MG tablet  Commonly known as: TYLENOL  Take 1-2 tablets (500-1,000 mg total) by mouth every six (6) hours as needed for pain or fever.     acyclovir 400 MG tablet  Commonly known as: ZOVIRAX  Take 1 tablet (400 mg total) by mouth Two (2) times a day.     amLODIPine 10 MG tablet  Commonly known as: NORVASC  Take 1 tablet (10 mg total) by mouth daily.     cetirizine 10 MG tablet  Commonly known as: ZyrTEC  Take 1 tablet (10 mg total) by mouth daily.     chlorthalidone 25 MG tablet  Commonly known as: HYGROTON  Take 2 tablets (50 mg total) by mouth every morning.     diphenhydrAMINE 25 mg capsule  Commonly known as: BENADRYL  Take 1 capsule (25 mg total) by mouth nightly as needed for itching.     docusate sodium 100 MG capsule  Commonly known as: COLACE  Take 1 capsule (100 mg total) by mouth two (2) times a day as needed for constipation.     insulin lispro 100 unit/mL  injection pen  Commonly known as: HumaLOG  Inject 3 units with breakfast and lunch. Plus sliding scale insulin TID with meals (2 units for every 50 > 150). Max daily dose: 42 units     levothyroxine 75 MCG tablet  Commonly known as: SYNTHROID  Take 1 tablet (75 mcg total) by mouth daily.     magnesium (amino acid chelate) 133 mg  Generic drug: magnesium oxide-Mg AA chelate  Take 1 tablet by mouth Two (2) times a day.     MAVYRET 100-40 mg tablet  Generic drug: glecaprevir-pibrentasvir  Take 3 tablets by mouth daily. Take with food.     mycophenolate 180 MG EC tablet  Commonly known as: MYFORTIC  Take 3 tablets (540 mg total) by mouth Two (2) times a day. Adjust dose per medication card.     nitroglycerin 0.4 MG SL tablet  Commonly known as: NITROSTAT  Place 1 tablet (0.4 mg total) under the tongue continuous as needed for chest pain. Maximum of 3 doses in 15 minutes.     oxyCODONE-acetaminophen 5-325 mg per tablet  Commonly known as: PERCOCET  Take 1 tablet by mouth nightly as needed for pain.     OZEMPIC 1 mg/dose (4 mg/3 mL) Pnij injection  Generic drug: semaglutide  Inject 1 mg under the skin every seven (7) days.     sulfamethoxazole-trimethoprim 400-80 mg per tablet  Commonly known as: BACTRIM  Take 1 tablet (80 mg of trimethoprim total) by mouth 3 (three) times a week.            Allergies:  Ferric citrate  ______________________________________________________________________  Pending Test Results:  Pending Labs     Order Current Status    HLA DSA Post Transplant In process          Most Recent Labs:  All lab results last 24 hours -   Recent Results (from the past 24 hour(s))   POCT Glucose    Collection Time: 02/14/21  5:14 PM   Result Value Ref Range    Glucose, POC 139 70 - 179 mg/dL   POCT Glucose    Collection Time: 02/14/21  9:06 PM   Result Value Ref Range    Glucose, POC 187 (H) 70 - 179 mg/dL   POCT Glucose    Collection Time: 02/15/21  7:44 AM   Result Value Ref Range    Glucose, POC 135 70 - 179 mg/dL   CBC    Collection Time: 02/15/21  9:48 AM   Result Value Ref Range    WBC 8.7 3.6 - 11.2 10*9/L    RBC 3.21 (L) 4.26 - 5.60 10*12/L    HGB 8.7 (L) 12.9 - 16.5 g/dL    HCT 16.1 (L) 09.6 - 48.0 %    MCV 86.3 77.6 - 95.7 fL    MCH 27.2 25.9 - 32.4 pg    MCHC 31.5 (L) 32.0 - 36.0 g/dL    RDW 04.5 (H) 40.9 - 15.2 %    MPV 9.1 6.8 - 10.7 fL    Platelet 157 150 - 450 10*9/L   Magnesium Level    Collection Time: 02/15/21  9:49 AM   Result Value Ref Range    Magnesium 2.1 1.6 - 2.6 mg/dL   Basic Metabolic Panel    Collection Time: 02/15/21  9:49 AM   Result Value Ref Range    Sodium 141 135 - 145 mmol/L    Potassium 5.2 (H) 3.4 - 4.8 mmol/L    Chloride  110 (H) 98 - 107 mmol/L    CO2 20.0 20.0 - 31.0 mmol/L    Anion Gap 11 5 - 14 mmol/L    BUN 71 (H) 9 - 23 mg/dL    Creatinine 1.61 (H) 0.60 - 1.10 mg/dL    BUN/Creatinine Ratio 21     eGFR CKD-EPI (2021) Male 20 (L) >=60 mL/min/1.82m2    Glucose 146 70 - 179 mg/dL    Calcium 8.9 8.7 - 09.6 mg/dL   Phosphorus Level    Collection Time: 02/15/21  9:49 AM   Result Value Ref Range    Phosphorus 3.5 2.4 - 5.1 mg/dL   POCT Glucose    Collection Time: 02/15/21 12:40 PM   Result Value Ref Range    Glucose, POC 164 70 - 179 mg/dL       Relevant Studies/Radiology:  XR Chest Portable    Result Date: 02/07/2021  EXAM: XR CHEST PORTABLE DATE: 02/07/2021 8:05 PM ACCESSION: [id number] UN DICTATED: 02/07/2021 8:40 PM INTERPRETATION LOCATION: [facility] CLINICAL INDICATION: 55 years old Male with SHORTNESS OF BREATH  COMPARISON: Two-view chest radiograph 12/03/2020 TECHNIQUE: Portable Chest Radiograph. FINDINGS: Pulmonary vascular congestion with increased patchy opacification of the bilateral lung bases. Question small bilateral pleural effusions. Stable cardiomediastinal silhouette.     Increased pulmonary edema. Question small bilateral pleural effusions.    MRA Pelvis Wo Contrast    Result Date: 02/13/2021  EXAM: MRA PELVIS WO CONTRAST DATE: 02/12/2021 5:38 PM ACCESSION: [id number] UN DICTATED: 02/12/2021 7:49 PM INTERPRETATION LOCATION: [facility] CLINICAL INDICATION: 55 years old Male with kidney transplant with c/f renal artery stenosis  COMPARISON: Renal transplant ultrasound 02/09/2021 TECHNIQUE: Multiplanar, multisequence MR imaging of the pelvis without IV contrast. Multiplanar reformatted and MIP images were provided for further evaluation of the vessels. FINDINGS: NATIVE KIDNEYS: The left kidney is surgically absent. The right kidney is atrophic. BLADDER: The bladder is unremarkable. BOWEL: No acute inflammatory process. No bowel obstruction. The appendix is normal (4:26). PERITONEUM/RETROPERITONEUM: No free fluid or air. REPRODUCTIVE: Unremarkable. VASCULATURE: Unremarkable. LYMPH NODES: No adenopathy. SOFT TISSUES: Unremarkable. BONES: No concerning osseous lesions.     The transplant kidney main renal artery is below the spatial resolution of MRI and not well visualized. Recommend ultrasound for further follow-up.    US Renal Transplant W Doppler    Result Date: 02/09/2021  EXAM: US RENAL TRANSPLANT W DOPPLER DATE: 02/09/2021 1:52 PM ACCESSION: [id number] UN DICTATED: 02/09/2021 1:53 PM INTERPRETATION LOCATION: [facility] CLINICAL INDICATION: 55 years old Male with worsening creatinine without explanation (biopsy not showing significant rejection) COMPARISON: 01/16/2021 and prior renal transplant ultrasounds TECHNIQUE:  Ultrasound views of the renal transplant were obtained using gray scale and color and spectral Doppler imaging. Views of the urinary bladder were obtained using gray scale and limited color Doppler imaging. FINDINGS: TRANSPLANTED KIDNEY: The renal transplant was located in the right lower quadrant. Normal size and echogenicity.  No solid masses. Two echogenic foci in the lower pole with twinkle artifact measuring up to 0.3 cm, similar to prior and may represent calculi versus artifact. No perinephric collections identified. No hydronephrosis. Redemonstrated complex fluid collection medial to the right kidney measuring 2.5 x 1.4 x 2.9 cm, previously 5.2 x 1.9 x 1.6 cm. VESSELS: - Perfusion: Using Adhrit Krenz Doppler, normal perfusion was seen throughout the renal parenchyma. - Resistive indices in the renal transplant are decreased compared with prior examination. Tardus parvus waveforms in the distal arcuate and segmental arteries. - Main renal artery/iliac artery: Patent with elevated velocities at the MRA anastomosis,  which are increased from prior now measuring 4.2 m/s and previously 3.8 m/s. - Main renal vein/iliac vein: Patent BLADDER: Underdistended, limiting evaluation.     1.Elevated velocities at the MRA anastomosis, increased from prior, with early distal tardus parvus waveforms and decreasing are otherwise concerning for renal artery stenosis. 2.Decreased size of complex fluid collection medial to the right lower quadrant transplant kidney, now measuring up to 2.9 cm. Please see below for data measurements: Transplant location: RLQ Renal Transplant: Sagittal 11.88 cm; AP 4.71 cm; Transverse 5.51 cm Arcuate artery superior resistive index: 0.54 Arcuate artery mid resistive index: 0.56 Arcuate artery inferior resistive index: 0.52 Previous resistive indices range of arcuate arteries: (0.66-0.72) Segmental artery superior resistive index: 0.57 Segmental artery mid resistive index: 0.58 Segmental artery inferior resistive index: 0.53 Previous resistive indices range of segmental arteries: (0.67-0.70) Main renal artery hilum resistive index: 0.60 Main renal artery mid resistive index: 0.64 Main renal artery anastomosis resistive index: 0.72 Previous resistive indices range of main renal artery: (0.73-0.82) Main renal vein: patent Iliac artery: Patent Iliac vein: Patent Bladder volume prevoid: 27.29  mL Bladder volume postvoid: mL     IR Biopsy Renal Percutaneous    Result Date: 02/10/2021  EXAM: IR BIOPSY RENAL PERCUTANEOUS DATE: 02/07/2021 10:41 AM ACCESSION: [id number] UN DICTATED: 02/07/2021 4:07 PM INTERPRETATION LOCATION: [facility] PROCEDURE: Ultrasound-guided biopsy Pre-procedure diagnosis: Renal transplant with concern for rejection Post-procedure diagnosis: Same Indication: Concern for right lower quadrant transplant rejection Previous biopsy of same target (QCDR): No Additional clinical history: None Complications: No immediate complications.     Ultrasound-guided non-target biopsy of the right lower quadrant transplant kidney. Plan: Specimen(s) sent for evaluation. _______________________________________________________________ PROCEDURE SUMMARY: - Percutaneous US-guided coaxial core needle biopsy - Additional procedure(s): None PROCEDURE DETAILS: Procedure personnel: Attending physician(s): Dr. Georgeann Oppenheim Fellow physician(s): None Resident physician(s): Dr. Jake Bathe Advanced practice provider(s): None Pre-procedure Consent: Informed consent for the procedure including risks, benefits and alternatives was obtained and time-out was performed prior to the procedure. Preparation: The site was prepared and draped using maximal sterile barrier technique including cutaneous antisepsis. Anesthesia/sedation Level of anesthesia/sedation: Moderate sedation (conscious sedation) Anesthesia/sedation administered by: Independent trained observer under attending supervision with continuous monitoring of the patients level of consciousness and physiologic status Total intra-service sedation time (minutes): I personally spent 22 minutes, continuously monitoring the patient face-to-face during the administration of moderate sedation. Radiology nurse was present for the duration of the procedure to assist in patient monitoring. Pre and Post Sedation activities have been reviewed. Imaging prior to biopsy The patient was positioned prone. Initial ultrasound was performed. Biopsy target: - Maximal diameter (cm): Not applicable - Location: Right lower quadrant Other findings: None Biopsy Local anesthesia was administered. Under US guidance, the biopsy needle was advanced to the target and biopsy was performed. Coaxial needle: 17 gauge Core needle biopsy device: CorVocet Core needle size: 18 gauge Number of core specimens: 3 Fine needle aspiration device: Not applicable Fine needle size: Not applicable Number of FNA specimens: Not applicable On-site biopsy touch preparation: Yes  Additional sampling recommendations: None Preliminary assessment of sample adequacy: Adequate Needle removal The biopsy needle was removed and a sterile dressing was applied. Tract embolization: Gelfoam slurry Imaging following biopsy Immediate post-biopsy ultrasound was performed. Post-biopsy imaging findings: No hematoma Additional Details Additional description of procedure: None Equipment details: None Specimens removed: None Estimated blood loss (mL): less than 10 mL Attestation Signer name: I attest that I was present for the entire procedure. I reviewed the stored images and agree  with the report as written.    ______________________________________________________________________  Discharge Instructions:   Activity Instructions     Activity as tolerated     Additional Activity Instructions:    Because you received sedation today:  Rest for the next 24 hours.  Do not drive a car, operate heavy machinery, drink alcohol or sign legal documents for 24 hours.  Avoid strenuous activity and heavy lifting (greater than 10 pounds) for 1 week.  Do not submerge site until healed. No tub baths, swimming pools until healed.    Dressing stays intact for 2 days.  After two days, remove dressing and gently wash site.  Dry and apply new clean dressing.   Wash site and apply new dressing daily until healed.    Please call [place] [facility] clinic at [phone] and choose option 3 to speak to a nurse for any problems or questions following discharge including:    Bleeding that saturates bandage  Pain at site not controlled by regular pain medicines  Signs of infection at site: redness, swelling, pus draining, fever above101.5  If site bleeds, hold firm pressure at site.     Calls received during normal business hours (Monday through Friday from 8:00 am to 4:30 pm) will be returned within one business day. Calls received after normal business hours OR on weekends will be returned during the next business day.     If this is an after-hours urgent matter, you can call the Hospital front desk at [phone] and ask to speak with the Interventional Radiology (VIR) doctor on call.    If you are experiencing a medical emergency, please call 911 or go to your nearest emergency room.      You can contact your referring provider after three days for biopsy results.                    Follow Up instructions and Outpatient Referrals     Call MD for:  difficulty breathing, headache or visual disturbances      Call MD for:  persistent nausea or vomiting      Call MD for:  severe uncontrolled pain      Call MD for:  temperature >38.5 Celsius      Discharge instructions          Appointments which have been scheduled for you    Feb 21, 2021  9:00 AM  (Arrive by 8:45 AM)  RETURN PHARMD with KIDNEY TRANSPLANT PHARMACY  [facility] [place] [facility]) [address]  [place] [address]  [phone]      Feb 21, 2021  9:30 AM  (Arrive by 9:15 AM)  RETURN NEPHROLOGY POST with Leafy Half, MD  [facility] [place] [facility]) [address]  [place] [address]  [phone]           ______________________________________________________________________  Discharge Day Services:  BP 168/69  - Pulse 75  - Temp 36.7 ??C (98.1 ??F) (Oral)  - Resp 18  - Ht 172.7 cm (5' 8)  - Wt (!) 114.7 kg (252 lb 14.4 oz) Comment: with prosthesis - SpO2 100%  - BMI 38.45 kg/m??     Pt seen on the day of discharge and determined appropriate for discharge.    Condition at Discharge: good    Length of Discharge: I spent greater than 30 mins in the discharge of this patient.

## 2021-02-15 NOTE — Unmapped (Signed)
Pt has no IV meds at this time. RN is going to page MD to see if ok to leave out IV. Pt is dialysis and right arm only. Explained sparing veins to RN. RN will replace order if pt condition changes or IV meds need to be given.

## 2021-02-15 NOTE — Unmapped (Signed)
Patient remains alert and oriented x4. Vital signs monitored. BP medications adjusted. Blood glucose monitored before meals. OOB to bathroom with prosthetics on R BKA. No assistance needed. Denies pain or discomfort. Possible discharge 11/17 if Creatinine levels continues to decrease as per notes from case management. Safety and comfort maintained. Will continue to monitor patient.     Problem: Adult Inpatient Plan of Care  Goal: Plan of Care Review  Outcome: Progressing  Goal: Patient-Specific Goal (Individualized)  Outcome: Progressing  Goal: Absence of Hospital-Acquired Illness or Injury  Outcome: Progressing  Intervention: Identify and Manage Fall Risk  Recent Flowsheet Documentation  Taken 02/14/2021 0819 by Lawrence Marseilles, RN  Safety Interventions:   low bed   nonskid shoes/slippers when out of bed  Intervention: Prevent Skin Injury  Recent Flowsheet Documentation  Taken 02/14/2021 0819 by Lawrence Marseilles, RN  Skin Protection: adhesive use limited  Intervention: Prevent and Manage VTE (Venous Thromboembolism) Risk  Recent Flowsheet Documentation  Taken 02/14/2021 0819 by Lawrence Marseilles, RN  Activity Management: activity adjusted per tolerance  VTE Prevention/Management:   ambulation promoted   anticoagulant therapy   dorsiflexion/plantar flexion performed  Intervention: Prevent Infection  Recent Flowsheet Documentation  Taken 02/14/2021 0819 by Lawrence Marseilles, RN  Infection Prevention: rest/sleep promoted  Goal: Optimal Comfort and Wellbeing  Outcome: Progressing  Goal: Readiness for Transition of Care  Outcome: Progressing  Goal: Rounds/Family Conference  Outcome: Progressing     Problem: Impaired Wound Healing  Goal: Optimal Wound Healing  Outcome: Progressing  Intervention: Promote Wound Healing  Recent Flowsheet Documentation  Taken 02/14/2021 0819 by Lawrence Marseilles, RN  Activity Management: activity adjusted per tolerance     Problem: Fluid and Electrolyte Imbalance (Acute Kidney Injury/Impairment)  Goal: Fluid and Electrolyte Balance  Outcome: Progressing     Problem: Renal Function Impairment (Acute Kidney Injury/Impairment)  Goal: Effective Renal Function  Outcome: Progressing     Problem: Breathing Pattern Ineffective  Goal: Effective Breathing Pattern  Outcome: Progressing     Problem: Diabetes Comorbidity  Goal: Blood Glucose Level Within Targeted Range  Outcome: Progressing     Problem: Hypertension Comorbidity  Goal: Blood Pressure in Desired Range  Outcome: Progressing

## 2021-02-15 NOTE — Unmapped (Signed)
Pt alert and oriented x4. Denied having any pain. BP continues to be elevated to the 180's. MD notified. Other vitals are stable. Remains on RA. BG monitored and insulin coverage provided. Pt lost PIV. Venous access team asking if okay for pt to be without PIV. MD paged, awaiting response. Pt ambulates with Rt prosthetics on. No falls/injuries. Home CPAP in use at night. Will continue to monitor.    Problem: Adult Inpatient Plan of Care  Goal: Plan of Care Review  Outcome: Progressing  Goal: Patient-Specific Goal (Individualized)  Outcome: Progressing  Goal: Absence of Hospital-Acquired Illness or Injury  Outcome: Progressing  Intervention: Identify and Manage Fall Risk  Recent Flowsheet Documentation  Taken 02/14/2021 2000 by Willa Frater, RN  Safety Interventions:  ??? low bed  ??? nonskid shoes/slippers when out of bed  Intervention: Prevent Skin Injury  Recent Flowsheet Documentation  Taken 02/14/2021 2000 by Willa Frater, RN  Skin Protection: adhesive use limited  Intervention: Prevent and Manage VTE (Venous Thromboembolism) Risk  Recent Flowsheet Documentation  Taken 02/14/2021 2100 by Willa Frater, RN  VTE Prevention/Management: anticoagulant therapy  Intervention: Prevent Infection  Recent Flowsheet Documentation  Taken 02/14/2021 2000 by Willa Frater, RN  Infection Prevention:  ??? cohorting utilized  ??? hand hygiene promoted  Goal: Optimal Comfort and Wellbeing  Outcome: Progressing  Goal: Readiness for Transition of Care  Outcome: Progressing  Goal: Rounds/Family Conference  Outcome: Progressing     Problem: Impaired Wound Healing  Goal: Optimal Wound Healing  Outcome: Progressing     Problem: Fluid and Electrolyte Imbalance (Acute Kidney Injury/Impairment)  Goal: Fluid and Electrolyte Balance  Outcome: Progressing     Problem: Renal Function Impairment (Acute Kidney Injury/Impairment)  Goal: Effective Renal Function  Outcome: Progressing

## 2021-02-16 MED ORDER — PRAVASTATIN 40 MG TABLET
ORAL_TABLET | Freq: Every day | ORAL | 0 refills | 90.00000 days | Status: CP
Start: 2021-02-16 — End: 2021-05-17
  Filled 2021-02-15: qty 90, 90d supply, fill #0

## 2021-02-16 NOTE — Unmapped (Signed)
Follow-Up Counseling for HCV Treatment      Regimen: Mavyret (glecaprevir/pibrentasvir 100/40 mg)  x 8 weeks  Start Date: 01/18/21  Completed Treatment Week #4  Anticipated end date: 03/14/21    Pharmacy: [facility] [phone] option #4    HIPAA information was verified with patient. Following topics were reviewed during the phone call:    1. Medication appropriateness and administration - Takes Mavyret 3 tabs once daily with food. Regimen is correct and unchanged.     2. Importance of adherence - Pill count over the phone revealed #26 day supply which is appropriate. Started new week box yesterday. Advised patient that once he finishes his supply at home, he will finish his 8 weeks of treatment.    3. Side effects - Denies any adverse effects attributable to Mavyret.     4. Changes to medications: Reviewed DC summary. Pt's rosuvastatin was switched to pravastatin 40mg  while admitted as rosuvastatin is non-formulary. Wanted to double check which statin pt is taking at home but pt did not want to walk down 14 steps to check. He reports he's taking whatever is in our system. Advised pt of potential increased levels of statin if taken with Mavyret. Advised pt to monitor for potential increased side effects from statin (i.e. Muscle ache, dark urine etc) and to notify PCP of these adverse events. Advised to be sure to not to take both statins.      5. Follow up - Stressed importance of follow up 3 months post treatment to assess for cure. Follow up via phone post-TW#12 (anticipated ~06/07/21)    All questions were answered.    Park Breed, Pharm D., BCPS, BCGP, CPP  [facility]  [address]  [place], [place] [address]  [phone]

## 2021-02-16 NOTE — Unmapped (Signed)
Called patient to ask him how I could relay his updated medactionplan to him. He gave me his email address and I emailed it to him. Loma Boston, RN Inpatient Transplant Nurse Coordinator 02/15/2021 4:23 PM

## 2021-02-19 LAB — HLA DS POST TRANSPLANT
ANTI-DONOR HLA-A #1 MFI: 19 MFI
ANTI-DONOR HLA-A #2 MFI: 19 MFI
ANTI-DONOR HLA-B #1 MFI: 0 MFI
ANTI-DONOR HLA-B #2 MFI: 0 MFI
ANTI-DONOR HLA-C #1 MFI: 46 MFI
ANTI-DONOR HLA-C #2 MFI: 0 MFI
ANTI-DONOR HLA-DP #2 MFI: 65 MFI
ANTI-DONOR HLA-DQB #1 MFI: 121 MFI
ANTI-DONOR HLA-DQB #2 MFI: 34 MFI
ANTI-DONOR HLA-DR #1 MFI: 52 MFI
ANTI-DONOR HLA-DR #2 MFI: 3 MFI

## 2021-02-19 LAB — FSAB CLASS 2 ANTIBODY SPECIFICITY: HLA CL2 AB RESULT: NEGATIVE

## 2021-02-19 LAB — FSAB CLASS 1 ANTIBODY SPECIFICITY: HLA CLASS 1 ANTIBODY RESULT: NEGATIVE

## 2021-02-19 NOTE — Unmapped (Signed)
Lab orders faxed for HCV VL

## 2021-02-20 DIAGNOSIS — Z940 Kidney transplant status: Principal | ICD-10-CM

## 2021-02-20 DIAGNOSIS — R82998 Other abnormal findings in urine: Principal | ICD-10-CM

## 2021-02-20 DIAGNOSIS — N179 Acute kidney failure, unspecified: Principal | ICD-10-CM

## 2021-02-20 MED ORDER — HYDRALAZINE 25 MG TABLET
ORAL_TABLET | Freq: Three times a day (TID) | ORAL | 11 refills | 30 days | Status: CP
Start: 2021-02-20 — End: 2022-02-20

## 2021-02-20 NOTE — Unmapped (Signed)
Discussed the MRA from 02/12/21 with the cardiovascular imaging reading room. The issue is that the MRA was completed without contrast. This patient has eGFR of 20 and it is reasonable to do the MRA with contrast in order to answer the clinical question of stenosis at the anastamosis. The MRA should be performed with dotrarem contrast.    Spoke w patient. He feels ok, some fatigue and diarrhea, some left groin pain intermittently.     SBP typically 150-170, no headaches.    DIscussed options for MRA with contrast, vs repeat ultrasound/PVL, with patient. He is amenable to MRA abdomen/pelvis with contrast.     Plan:  - increase hydralazine to 50 mg TID  - MRA abd/pelvis with dotrarem contrast  - still ok to pursue belatacept, seeking info about copay

## 2021-02-20 NOTE — Unmapped (Unsigned)
[facility] PHARMACY NOTE  02/20/2021   Nicholas Strong  [id number]    Medication changes today:   1. Decrease carvedilol to 12.5 mg BID  2. Start cetirizine 10 mg daily PRN  3. Rx sent for Envarsus to investigate coverage/mfg assistance  4. Increase semaglutide to 1 mg weekly  5. Decrease tacrolimus to 4 mg AM and 3 mg PM    Education/Adherence tools provided today:  - Provided updated medication list  - Provided additional education on immunosuppression and transplant related medications including reviewing indications of medications, dosing and side effects  - Provided additional education on diabetes management  - Provided additional pill box education    Follow up items:  1. Goal of understanding indications and dosing of immunosuppression medications  2. BG  3. BP/orthostasis  4. HCV RNA/Genotype  5. Mfg assistance/PAP application    Next visit with pharmacy in 1 month  ____________________________________________________________________    Nicholas Strong is a 55 y.o. male s/p deceased kidney transplant on 12-09-2020 (Kidney) 2/2 T2DM polyneuropathy, renal stone, and RCC (s/p left nephrectomy).     Immunologic Risk: cPRA 0,, HLA MM 5/6, and first transplant    Induction Agent : thymoglobulin    Donor Factors: KDPI 70%, DBD and HCV NAT+    Other PMH significant for Hypertension, CAD and Hypothyroidism  Post op course complicated by [place] on POD0 showed elevated velocity in the main renal artery at the level of the anastomosis, likely secondary to post-op edema    Rejection History: NTD  Infection History: NTD  ___________________________________________________________________    Last seen by pharmacy 2 weeks ago    Interval History: NTD    Seen by pharmacy today for: medication management, blood glucose management and education and pill box fill and adherence education    CC:  Patient complains of diarrhea last night. Pt also reports concerns with medication cost.    General: No issues   Neuro: Tremors (mild)  CV: No issues  Resp: No issues  GI: Diarrhea and Nausea (worse last night - possibly food related)  GU: No issues  Derm: No issues  Psych: No issues.    Fluid Status:   Edema no, SOB no  Intake: ~60 oz daily (struggling to increase intake after previous restriction with dialysis)  Output: not recording, urinating regularly   Plan: Fluid goal of at least 80 oz. Continue to monitor.      There were no vitals filed for this visit.  ___________________________________________________________________    Allergies   Allergen Reactions   ??? Ferric Citrate Nausea And Vomiting       Medications reviewed in EPIC medication station and updated today by the clinical pharmacist practitioner.    Current Outpatient Medications   Medication Instructions   ??? acetaminophen (TYLENOL) 500-1,000 mg, Oral, Every 6 hours PRN   ??? acyclovir (ZOVIRAX) 400 mg, Oral, 2 times a day (standard)   ??? amLODIPine (NORVASC) 10 mg, Oral, Daily (standard)   ??? carvediloL (COREG) 37.5 mg, Oral, 2 times a day (standard)   ??? cetirizine (ZYRTEC) 10 mg, Oral, Daily (standard)   ??? chlorthalidone (HYGROTON) 50 mg, Oral, Every morning   ??? diphenhydrAMINE (BENADRYL) 25 mg, Oral, Nightly PRN   ??? docusate sodium (COLACE) 100 mg, Oral, 2 times a day PRN   ??? glecaprevir-pibrentasvir (MAVYRET) 100-40 mg tablet 3 tablets, Oral, Daily (standard), Take with food.   ??? hydrALAZINE (APRESOLINE) 25 mg, Oral, 3 times a day (standard)   ??? insulin glargine (BASAGLAR,  LANTUS) 35 Units, Subcutaneous, Nightly   ??? insulin lispro (HUMALOG) 100 unit/mL injection pen Inject 3 units with breakfast and lunch. Plus sliding scale insulin TID with meals (2 units for every 50 > 150). Max daily dose: 42 units   ??? levothyroxine (SYNTHROID) 75 mcg, Oral, Daily (standard)   ??? magnesium oxide-Mg AA chelate (MAGNESIUM, AMINO ACID CHELATE,) 133 mg 1 tablet, Oral, 2 times a day (standard)   ??? melatonin 5 mg tablet Oral, Nightly PRN   ??? mycophenolate (MYFORTIC) 540 mg, Oral, 2 times a day (standard), Adjust dose per medication card.   ??? nitroglycerin (NITROSTAT) 0.4 mg, Sublingual, Continuous PRN, Maximum of 3 doses in 15 minutes.    ??? oxyCODONE-acetaminophen (PERCOCET) 5-325 mg per tablet 1 tablet, Oral, Nightly PRN   ??? OZEMPIC 1 mg, Subcutaneous, Every 7 days   ??? pravastatin (PRAVACHOL) 40 mg, Oral, Daily (standard)   ??? sulfamethoxazole-trimethoprim (BACTRIM) 400-80 mg per tablet 80 mg of trimethoprim, Oral, 3 times weekly   ??? tacrolimus (ENVARSUS XR) 8 mg, Oral, Daily (standard)         GRAFT FUNCTION: improving, bump today likely 2/2 dehydration with diarrhea and elevated tac level  Lab Results   Component Value Date    Creatinine 3.42 (H) 02/15/2021    Creatinine 3.12 (H) 02/14/2021    Creatinine 3.24 (H) 02/13/2021    Creatinine 3.63 (H) 02/12/2021       Baseline Scr: TBD  Scr nadir: 2.07  Estimated Creatinine Clearance: 30 mL/min (A) (based on SCr of 3.42 mg/dL (H)).    Proteinuria/UPC: No/ none.  Lab Results   Component Value Date    Protein/Creatinine Ratio, Urine 0.168 01/24/2021    Protein/Creatinine Ratio, Urine 0.166 01/10/2021    Protein/Creatinine Ratio, Urine 0.236 12/27/2020    Protein/Creatinine Ratio, Urine 0.53 (H) 12/11/2020    Protein/Creatinine Ratio, Urine 0.744 12/08/2020       DSA: ntd  Lab Results   Component Value Date    DSA Comment  02/08/2021      Comment:      The HLA antigens listed are donor antigens and the corresponding MFI value.  MFI values >= 1000 are considered positive.  A negative result does not exclude the presence of low level DSA.  Blank donor antigen and MFI fields indicate unavailable donor HLA   type or lack of appropriate single antigen bead to determine DSA.  As such, the presence or absence of DSA to the locus cannot be confirmed.     Donor specific antibody (DSA) testing is performed with a  solid phase multiplex single antigen bead array method.  All procedures and reagents have been validated and performance characteristics determined by the [facility].    Certain of these tests have not been cleared/approved by the U.S. Food and Drug Administration (FDA).  The FDA has determined that such approval/clearance is not necessary because this laboratory is certified under the Clinical Laboratory Improvements   Amendments to perform high complexity testing.  This test is used for clinical purposes.  It should not be regarded as investigational or for research.  All HLA typings are performed using molecular methodologies.  HLA-A, B, C, DR, and DQ results are   serological equivalents of the molecular type.           Zero hour biopsy:       Biopsies to date: NTD      CURRENT IMMUNOSUPPRESSION:    Tacrolimus (Prograf) 5 mg BID  Tacrolimus Goal: 8 -  10   Mycophenolate sodium (Myfortic) 540 mg BID      IMMUNOSUPPRESSION DRUG LEVELS:  Lab Results   Component Value Date    Tacrolimus, Trough 9.8 02/14/2021    Tacrolimus, Trough 5.2 02/11/2021    Tacrolimus, Trough 2.3 (L) 02/09/2021     Prograf level is accurate 12 hour trough, took tac at 9pm last night     Patient is tolerating immunosuppression well    WBC/ANC:  wnl  Lab Results   Component Value Date    WBC 8.7 02/15/2021    WBC 7.9 02/05/2021       Plan: Will decrease tacrolimus to 4 mg AM and 3 mg PM. Continue to monitor.      OI Prophylaxis:   CMV Status: D- /R-, low risk.   CMV prophylaxis: acyclovir 400 mg bid x 3 months per protocol.  No results found for: CMVCP    PCP Prophylaxis: bactrim SS 1 tab MWF x 6 months.    Thrush: completed in hospital    Patient is  tolerating infectious prophylaxis well    Plan: Continue per protocol. Continue to monitor.    HCV Donor+ Organ  HCV RNA status: >150,000 today  Plan: Genotype will be assessed and hepatology will be engaged for HCV treatment initiation.    CV Prophylaxis: asa 81 mg   The ASCVD Risk score (Arnett DK, et al., 2019) failed to calculate.  No results found for: LIPID  Statin therapy: Indicated; currently on rosuvastatin 10 mg (switched from prior atorvastatin 40 mg d/t potential use of Mavyret outpatient for HCV based on donor HCV NAT+ status)  Plan: no changes. Continue to monitor       BP: Goal < 140/90. Clinic vitals reported above  Home BP ranges: 115 - 150/60 - 70. Does report hypotension (lowest SBP 88)  Current meds include: carvedilol 25 mg BID, chlorthalidone 25 mg   Plan: mostly within goal. Decrease carvedilol to 12.5 mg BID. Continue to monitor    Anemia of CKD:  H/H:   Lab Results   Component Value Date    HGB 8.7 (L) 02/15/2021     Lab Results   Component Value Date    HCT 27.7 (L) 02/15/2021     Iron panel:  Lab Results   Component Value Date    IRON 143 01/10/2021    TIBC 244 (L) 01/10/2021    FERRITIN 1,982.2 (H) 01/10/2021     Lab Results   Component Value Date    Iron Saturation (%) 59 (H) 01/10/2021       Prior ESA use: none  Plan: stable. No change.  Continue to monitor.     DM:   Lab Results   Component Value Date    A1C 5.3 12/03/2020   . Goal A1c < 7  History of Dm? Yes: T2DM since 1997  Established with endocrinologist/PCP for BG managment? No  Currently on:   Lantus 50U daily (taking in the morning)  Humalog 3U with breakfast and lunch + SSI  Ozempic 0.5 mg weekly (restarted 9/16)  Home BS log:  - Fasting AM 150 - 150 (outlier: 200)  - Pre-lunch: 138 - 162  - Pre-dinner: 110 - 150 (outlier: 201)    Diet: pt eats 2 meals per day (assessing insulin either at lunch or dinner time depending on when he eats)  Exercise: not assessed  Hypoglycemia: no  Plan: Increase Ozempic to 1 mg weekly. Continue to monitor.      Electrolytes:  wnl, K+ has been upper limit  Lab Results   Component Value Date    Potassium 5.2 (H) 02/15/2021    Sodium 141 02/15/2021    Magnesium 2.1 02/15/2021    CO2 20.0 02/15/2021    Phosphorus 3.5 02/15/2021     Meds currently on: none  Plan: No change.  Continue to monitor     GI/BM: pt reports normal BMs outside of a couple isolated incidences of diarrhea  Meds currently on: docusate PRN (daily), Miralax PRN (not using), famotidine 20 mg PRN (not needed)  Plan: No change.  Remove miralax and faoitidine from medlist. Continue to monitor    Pain: pt reports no pain   Meds currently on: APAP PRN (using occasionally for pain at amputation site)  Plan: No change. Continue to monitor    Bone health:   Vitamin D Level: none available. Goal > 30.   Lab Results   Component Value Date    Vitamin D Total (25OH) 13.5 (L) 01/24/2021       Lab Results   Component Value Date    Calcium 8.9 02/15/2021    Calcium 8.9 02/14/2021     Last DEXA results:  none available  Current meds include: none   Plan: Vitamin D level  needs to be drawn with next lab schedule 3 months after transplant,assess supplementation needs. Continue to monitor.     Women's/Men's Health:  Nicholas Strong is a 55 y.o. male. Patient reports no men's/women's health issues  Plan: Continue to monitor    Hypothyroidism:  Current meds: levothyroxine 75 mcg  Plan: no changes. Continue to monitor and follow-up with TSH.    Sleep:  Patient is sleeping okay since discharge (waking up at night to pee often), not taking anything for sleep  Current meds: benadryl (not taking), melatonin 5 mg PRN (not taking)  Plan: no changes. Will try melatonin. Continue to monitor    Immunizations:  Influenza [Annual]: Received 2021, 12/27/20    PCV13: Never received  PPSV23: Received 05/2015  PCV20: Never received    Shingrix Zoster [2 doses, 2 - 6 months apart]: Never received    COVID-19 [3 primary doses, 2 boosters]: 1st dose given 06/11/19, 2nd dose given 07/07/19, 3rd dose given 12/31/19 and Booster given 08/31/20    Pharmacy preference:  [facility]   Medication Refills:  n/a  Medication Access:  n/a    Adherence: Patient has average understanding of medications; was not able to independently identify names/doses of immunosuppressants and OI meds.  Patient  does fill their own pill box on a regular basis at home.  Patient brought medication card:yes  Pill box:did not bring  Plan: become more familiar with medications; provided moderate adherence counseling/intervention    Patient was reviewed with Dr. Elvera Maria who was agreement with the stated plan:     During this visit, the following was completed:   BG log data assessment  BP log data assessment  Labs ordered and evaluated  complex treatment plan >1 DS   I spent a total of 30 minutes face to face with the patient delivering clinical care and providing education/counseling.    All questions/concerns were addressed to the patient's satisfaction.  __________________________________________  Sandria Bales, CPP, PharmD

## 2021-02-21 ENCOUNTER — Other Ambulatory Visit (HOSPITAL_COMMUNITY)
Admission: RE | Admit: 2021-02-21 | Discharge: 2021-02-21 | Disposition: A | Discharge status: Home / Self Care | Payer: Medicare Other | Source: Ambulatory Visit | Attending: Pediatric Nephrology | Admitting: Pediatric Nephrology

## 2021-02-21 DIAGNOSIS — D899 Disorder involving the immune mechanism, unspecified: Secondary | ICD-10-CM | POA: Insufficient documentation

## 2021-02-21 LAB — CBC W/ DIFFERENTIAL
BASOPHILS ABSOLUTE COUNT: 0 10*3/uL (ref 0.0–0.11)
BASOPHILS RELATIVE PERCENT: 0 %
EOSINOPHILS ABSOLUTE COUNT: 0.2 10*3/uL (ref 0.0–0.51)
EOSINOPHILS RELATIVE PERCENT: 2 %
HEMATOCRIT: 29.4 — ABNORMAL LOW (ref 39.0–52.01)
HEMOGLOBIN: 8.9 g/dL — ABNORMAL LOW (ref 13.0–17.0)
LYMPHOCYTES ABSOLUTE COUNT: 0.4 10*3/uL — ABNORMAL LOW (ref 0.7–4.01)
LYMPHOCYTES RELATIVE PERCENT: 4 %
MEAN CORPUSCULAR HEMOGLOBIN CONC: 30.3 g/dL (ref 20.0–36.01)
MEAN CORPUSCULAR HEMOGLOBIN: 27.9 pg (ref 26.0–34.0)
MEAN CORPUSCULAR VOLUME: 92.2 fL (ref 80.0–100.01)
MONOCYTES ABSOLUTE COUNT: 0.9 10*3/uL (ref 0.1–1.01)
MONOCYTES RELATIVE PERCENT: 8 %
NEUTROPHILS ABSOLUTE COUNT: 9.3 — ABNORMAL HIGH
NEUTROPHILS RELATIVE PERCENT: 85 %
NUCLEATED RED BLOOD CELLS: 0 (ref 0.0–0.2)
PLATELET COUNT: 167 10*3/uL (ref 150–4001)
RED BLOOD CELL COUNT: 3.19 MIL/uL — ABNORMAL LOW (ref 4.22–5.81)
RED CELL DISTRIBUTION WIDTH: 16.3 — ABNORMAL HIGH (ref 11.5–15.5)
WHITE BLOOD CELL COUNT: 10.8 — ABNORMAL HIGH (ref 4.0–10.41)

## 2021-02-21 LAB — BASIC METABOLIC PANEL
ANION GAP: 8 (ref 5–151)
Anion gap: 8 (ref 5–15)
BLOOD UREA NITROGEN: 82 mg/dL — ABNORMAL HIGH (ref 6–20)
BUN: 82 mg/dL — ABNORMAL HIGH (ref 6–20)
CALCIUM: 8.8 mg/dL — ABNORMAL LOW (ref 8.9–10.31)
CHLORIDE: 114 — ABNORMAL HIGH (ref 98–111)
CO2: 18 mmol/L — ABNORMAL LOW (ref 22–32)
CO2: 18 mmol/L — ABNORMAL LOW (ref 22–321)
CREATININE: 3.67 mg/dL — ABNORMAL HIGH (ref 0.61–1.241)
Calcium: 8.8 mg/dL — ABNORMAL LOW (ref 8.9–10.3)
Chloride: 114 mmol/L — ABNORMAL HIGH (ref 98–111)
Creatinine, Ser: 3.67 mg/dL — ABNORMAL HIGH (ref 0.61–1.24)
EGFR CKD-EPI AA MALE: 19 mL/min — ABNORMAL LOW
GFR, Estimated: 19 mL/min — ABNORMAL LOW (ref >60)
GLUCOSE RANDOM: 97 mg/dL (ref 70–991)
Glucose, Bld: 97 mg/dL (ref 70–99)
MAGNESIUM: 2.3 mg/dL (ref 1.7–2.43)
PHOSPHORUS: 4.6 mg/dL (ref 2.5–4.6)
POTASSIUM: 5.3 mmol/L — ABNORMAL HIGH (ref 3.5–5.1)
Potassium: 5.3 mmol/L — ABNORMAL HIGH (ref 3.5–5.1)
SODIUM: 140 mmol/L (ref 135–145)
Sodium: 140 mmol/L (ref 135–145)

## 2021-02-21 LAB — CBC WITH DIFFERENTIAL/PLATELET
Abs Immature Granulocytes: 0.05 10*3/uL (ref 0.00–0.07)
Basophils Absolute: 0 10*3/uL (ref 0.0–0.1)
Basophils Relative: 0 %
Eosinophils Absolute: 0.2 10*3/uL (ref 0.0–0.5)
Eosinophils Relative: 2 %
HCT: 29.4 % — ABNORMAL LOW (ref 39.0–52.0)
Hemoglobin: 8.9 g/dL — ABNORMAL LOW (ref 13.0–17.0)
Immature Granulocytes: 1 %
Lymphocytes Relative: 4 %
Lymphs Abs: 0.4 10*3/uL — ABNORMAL LOW (ref 0.7–4.0)
MCH: 27.9 pg (ref 26.0–34.0)
MCHC: 30.3 g/dL (ref 30.0–36.0)
MCV: 92.2 fL (ref 80.0–100.0)
Monocytes Absolute: 0.9 10*3/uL (ref 0.1–1.0)
Monocytes Relative: 8 %
Neutro Abs: 9.3 10*3/uL — ABNORMAL HIGH (ref 1.7–7.7)
Neutrophils Relative %: 85 %
Platelets: 167 10*3/uL (ref 150–400)
RBC: 3.19 MIL/uL — ABNORMAL LOW (ref 4.22–5.81)
RDW: 16.3 % — ABNORMAL HIGH (ref 11.5–15.5)
WBC: 10.8 10*3/uL — ABNORMAL HIGH (ref 4.0–10.5)
nRBC: 0 % (ref 0.0–0.2)

## 2021-02-21 LAB — PHOSPHORUS: Phosphorus: 4.6 mg/dL (ref 2.5–4.6)

## 2021-02-21 LAB — MAGNESIUM: Magnesium: 2.3 mg/dL (ref 1.7–2.4)

## 2021-02-22 LAB — HCV RNA DIAGNOSIS, NAA: HCV RNA, Quantitation: NOT DETECTED IU/mL

## 2021-02-23 LAB — MISC LABCORP TEST (SEND OUT): Labcorp test code: 550080

## 2021-02-23 LAB — TACROLIMUS LEVEL: Tacrolimus (FK506) - LabCorp: 7.2 ng/mL (ref 2.0–20.0)

## 2021-02-23 LAB — HEPATITIS C GENOTYPE

## 2021-02-26 ENCOUNTER — Other Ambulatory Visit (HOSPITAL_COMMUNITY)
Admission: RE | Admit: 2021-02-26 | Discharge: 2021-02-26 | Disposition: A | Discharge status: Home / Self Care | Payer: Medicare Other | Source: Ambulatory Visit | Attending: Pediatric Nephrology | Admitting: Pediatric Nephrology

## 2021-02-26 DIAGNOSIS — D631 Anemia in chronic kidney disease: Secondary | ICD-10-CM | POA: Diagnosis not present

## 2021-02-26 DIAGNOSIS — Z940 Kidney transplant status: Secondary | ICD-10-CM | POA: Insufficient documentation

## 2021-02-26 DIAGNOSIS — Z789 Other specified health status: Secondary | ICD-10-CM | POA: Insufficient documentation

## 2021-02-26 DIAGNOSIS — B259 Cytomegaloviral disease, unspecified: Secondary | ICD-10-CM | POA: Diagnosis not present

## 2021-02-26 DIAGNOSIS — E1122 Type 2 diabetes mellitus with diabetic chronic kidney disease: Secondary | ICD-10-CM | POA: Diagnosis not present

## 2021-02-26 DIAGNOSIS — Z9483 Pancreas transplant status: Secondary | ICD-10-CM | POA: Insufficient documentation

## 2021-02-26 DIAGNOSIS — N189 Chronic kidney disease, unspecified: Secondary | ICD-10-CM | POA: Insufficient documentation

## 2021-02-26 DIAGNOSIS — Z09 Encounter for follow-up examination after completed treatment for conditions other than malignant neoplasm: Secondary | ICD-10-CM | POA: Diagnosis not present

## 2021-02-26 DIAGNOSIS — Z79899 Other long term (current) drug therapy: Secondary | ICD-10-CM | POA: Diagnosis not present

## 2021-02-26 DIAGNOSIS — D899 Disorder involving the immune mechanism, unspecified: Secondary | ICD-10-CM | POA: Diagnosis not present

## 2021-02-26 LAB — CBC WITH DIFFERENTIAL/PLATELET
Abs Immature Granulocytes: 0.03 10*3/uL (ref 0.00–0.07)
Basophils Absolute: 0 10*3/uL (ref 0.0–0.1)
Basophils Relative: 1 %
Eosinophils Absolute: 0.1 10*3/uL (ref 0.0–0.5)
Eosinophils Relative: 2 %
HCT: 30.9 % — ABNORMAL LOW (ref 39.0–52.0)
Hemoglobin: 9.1 g/dL — ABNORMAL LOW (ref 13.0–17.0)
Immature Granulocytes: 0 %
Lymphocytes Relative: 4 %
Lymphs Abs: 0.3 10*3/uL — ABNORMAL LOW (ref 0.7–4.0)
MCH: 28.2 pg (ref 26.0–34.0)
MCHC: 29.4 g/dL — ABNORMAL LOW (ref 30.0–36.0)
MCV: 95.7 fL (ref 80.0–100.0)
Monocytes Absolute: 0.7 10*3/uL (ref 0.1–1.0)
Monocytes Relative: 9 %
Neutro Abs: 6.2 10*3/uL (ref 1.7–7.7)
Neutrophils Relative %: 84 %
Platelets: 147 10*3/uL — ABNORMAL LOW (ref 150–400)
RBC: 3.23 MIL/uL — ABNORMAL LOW (ref 4.22–5.81)
RDW: 16.4 % — ABNORMAL HIGH (ref 11.5–15.5)
WBC: 7.3 10*3/uL (ref 4.0–10.5)
nRBC: 0 % (ref 0.0–0.2)

## 2021-02-26 LAB — BASIC METABOLIC PANEL
Anion gap: 8 (ref 5–15)
BUN: 69 mg/dL — ABNORMAL HIGH (ref 6–20)
CO2: 17 mmol/L — ABNORMAL LOW (ref 22–32)
Calcium: 8.8 mg/dL — ABNORMAL LOW (ref 8.9–10.3)
Chloride: 114 mmol/L — ABNORMAL HIGH (ref 98–111)
Creatinine, Ser: 3.12 mg/dL — ABNORMAL HIGH (ref 0.61–1.24)
GFR, Estimated: 23 mL/min — ABNORMAL LOW (ref >60)
Glucose, Bld: 78 mg/dL (ref 70–99)
Potassium: 5 mmol/L (ref 3.5–5.1)
Sodium: 139 mmol/L (ref 135–145)

## 2021-02-26 LAB — PHOSPHORUS: Phosphorus: 4.2 mg/dL (ref 2.5–4.6)

## 2021-02-26 LAB — MAGNESIUM: Magnesium: 2.2 mg/dL (ref 1.7–2.4)

## 2021-02-27 LAB — CBC W/ DIFFERENTIAL
BASOPHILS ABSOLUTE COUNT: 0 10*3/uL (ref 0.0–0.1)
BASOPHILS RELATIVE PERCENT: 1 %
EOSINOPHILS ABSOLUTE COUNT: 0.1 (ref 0.0–0.51)
EOSINOPHILS RELATIVE PERCENT: 2 %
HEMATOCRIT: 30.9 — ABNORMAL LOW
HEMOGLOBIN: 9.1 g/dL — ABNORMAL LOW (ref 13.0–17.0)
IMMATURE CELLS: 0
LYMPHOCYTES ABSOLUTE COUNT: 0.3 10*3/uL — ABNORMAL LOW
LYMPHOCYTES RELATIVE PERCENT: 4 %
MEAN CORPUSCULAR HEMOGLOBIN CONC: 29.4 g/dL — ABNORMAL LOW (ref 30.0–36.0)
MEAN CORPUSCULAR HEMOGLOBIN: 28.2 pg (ref 26.0–34.0)
MEAN CORPUSCULAR VOLUME: 95.7 fL (ref 80.0–100.0)
MONOCYTES ABSOLUTE COUNT: 0.7 10*3/uL (ref 0.1–1.01)
MONOCYTES RELATIVE PERCENT: 9 %
NEUTROPHILS RELATIVE PERCENT: 84 %
NUCLEATED RED BLOOD CELLS: 0 % (ref 0.0–0.2)
PLATELET COUNT: 147 10*3/uL — ABNORMAL LOW (ref 150–400)
RED BLOOD CELL COUNT: 3.23 MIL/uL — ABNORMAL LOW (ref 4.22–5.81)
RED CELL DISTRIBUTION WIDTH: 16.4 % — ABNORMAL HIGH (ref 11.5–15.51)
WHITE BLOOD CELL COUNT: 7.3 10*3/uL (ref 4.0–10.5)

## 2021-02-27 LAB — BASIC METABOLIC PANEL
ANION GAP: 8 (ref 5–15)
BLOOD UREA NITROGEN: 69 mg/dL — ABNORMAL HIGH
CALCIUM: 8.8 mg/dL — ABNORMAL LOW (ref 8.9–10.3)
CHLORIDE: 114 mmol/L — ABNORMAL HIGH
CO2: 17 mmol/L — ABNORMAL LOW (ref 22–32)
CREATININE: 3.12 mg/dL — ABNORMAL HIGH (ref 0.61–1.24)
EGFR CKD-EPI AA MALE: 23 mL/min — ABNORMAL LOW
GLUCOSE RANDOM: 78 mg/dL (ref 70–99)
MAGNESIUM: 2.2 mg/dL (ref 1.7–2.4)
PHOSPHORUS: 4.2 mg/dL (ref 2.5–4.6)
POTASSIUM: 5 mmol/L (ref 3.5–5.1)
SODIUM: 139 mmol/L

## 2021-02-27 LAB — HCV RNA DIAGNOSIS, NAA: HCV RNA, Quantitation: NOT DETECTED IU/mL

## 2021-02-28 LAB — HEPATITIS C RNA, QUANTITATIVE, PCR: HCV RNA(IU): NOT DETECTED

## 2021-02-28 LAB — TACROLIMUS LEVEL, TROUGH: TACROLIMUS, TROUGH: 7.6 ng/mL (ref 2.0–20.0)

## 2021-02-28 LAB — HEPATITIS C GENOTYPE

## 2021-02-28 LAB — TACROLIMUS LEVEL: Tacrolimus (FK506) - LabCorp: 7.6 ng/mL (ref 2.0–20.0)

## 2021-02-28 NOTE — Unmapped (Signed)
Specialty Medication(s): Mavyret 100-40mg     Nicholas Strong has been dis-enrolled from the [facility] specialty pharmacy services related to hepatitis C due to therapy completion - expected therapy completion date: 03/14/21.  His last shipment of Mavyret was delivered on 02/08/21..    Additional information provided to the patient: n/a    Roderic Palau  [facility] Specialty Pharmacist

## 2021-03-01 DIAGNOSIS — Z940 Kidney transplant status: Principal | ICD-10-CM

## 2021-03-01 DIAGNOSIS — R051 Acute cough: Secondary | ICD-10-CM | POA: Diagnosis not present

## 2021-03-01 DIAGNOSIS — R600 Localized edema: Secondary | ICD-10-CM | POA: Diagnosis not present

## 2021-03-01 DIAGNOSIS — R519 Headache, unspecified: Secondary | ICD-10-CM | POA: Diagnosis not present

## 2021-03-01 DIAGNOSIS — R0602 Shortness of breath: Secondary | ICD-10-CM | POA: Diagnosis not present

## 2021-03-01 DIAGNOSIS — R0981 Nasal congestion: Secondary | ICD-10-CM | POA: Diagnosis not present

## 2021-03-01 DIAGNOSIS — Z03818 Encounter for observation for suspected exposure to other biological agents ruled out: Secondary | ICD-10-CM | POA: Diagnosis not present

## 2021-03-01 MED ORDER — HYDRALAZINE 100 MG TABLET
ORAL_TABLET | Freq: Three times a day (TID) | ORAL | 11 refills | 30.00000 days | Status: CP
Start: 2021-03-01 — End: 2022-03-01
  Filled 2021-03-06: qty 90, 30d supply, fill #0

## 2021-03-02 DIAGNOSIS — R600 Localized edema: Principal | ICD-10-CM

## 2021-03-05 ENCOUNTER — Other Ambulatory Visit (HOSPITAL_COMMUNITY)
Admission: RE | Admit: 2021-03-05 | Discharge: 2021-03-05 | Disposition: A | Discharge status: Home / Self Care | Payer: Medicare Other | Source: Ambulatory Visit | Attending: Pediatric Nephrology | Admitting: Pediatric Nephrology

## 2021-03-05 ENCOUNTER — Ambulatory Visit: Payer: Medicare Other | Admitting: Orthopedic Surgery

## 2021-03-05 DIAGNOSIS — Z09 Encounter for follow-up examination after completed treatment for conditions other than malignant neoplasm: Secondary | ICD-10-CM | POA: Diagnosis not present

## 2021-03-05 DIAGNOSIS — E1122 Type 2 diabetes mellitus with diabetic chronic kidney disease: Secondary | ICD-10-CM | POA: Diagnosis not present

## 2021-03-05 DIAGNOSIS — Z789 Other specified health status: Secondary | ICD-10-CM | POA: Insufficient documentation

## 2021-03-05 DIAGNOSIS — D631 Anemia in chronic kidney disease: Secondary | ICD-10-CM | POA: Diagnosis not present

## 2021-03-05 DIAGNOSIS — D899 Disorder involving the immune mechanism, unspecified: Secondary | ICD-10-CM | POA: Insufficient documentation

## 2021-03-05 DIAGNOSIS — Z79899 Other long term (current) drug therapy: Secondary | ICD-10-CM | POA: Insufficient documentation

## 2021-03-05 DIAGNOSIS — N390 Urinary tract infection, site not specified: Secondary | ICD-10-CM | POA: Insufficient documentation

## 2021-03-05 LAB — CBC WITH DIFFERENTIAL/PLATELET
Abs Immature Granulocytes: 0.03 10*3/uL (ref 0.00–0.07)
Basophils Absolute: 0 10*3/uL (ref 0.0–0.1)
Basophils Relative: 1 %
Eosinophils Absolute: 0.2 10*3/uL (ref 0.0–0.5)
Eosinophils Relative: 3 %
HCT: 32.1 % — ABNORMAL LOW (ref 39.0–52.0)
Hemoglobin: 9.8 g/dL — ABNORMAL LOW (ref 13.0–17.0)
Immature Granulocytes: 1 %
Lymphocytes Relative: 6 %
Lymphs Abs: 0.3 10*3/uL — ABNORMAL LOW (ref 0.7–4.0)
MCH: 28.2 pg (ref 26.0–34.0)
MCHC: 30.5 g/dL (ref 30.0–36.0)
MCV: 92.5 fL (ref 80.0–100.0)
Monocytes Absolute: 0.6 10*3/uL (ref 0.1–1.0)
Monocytes Relative: 9 %
Neutro Abs: 4.8 10*3/uL (ref 1.7–7.7)
Neutrophils Relative %: 80 %
Platelets: 210 10*3/uL (ref 150–400)
RBC: 3.47 MIL/uL — ABNORMAL LOW (ref 4.22–5.81)
RDW: 15.9 % — ABNORMAL HIGH (ref 11.5–15.5)
WBC: 5.9 10*3/uL (ref 4.0–10.5)
nRBC: 0 % (ref 0.0–0.2)

## 2021-03-05 LAB — BASIC METABOLIC PANEL
Anion gap: 5 (ref 5–15)
BUN: 59 mg/dL — ABNORMAL HIGH (ref 6–20)
CO2: 22 mmol/L (ref 22–32)
Calcium: 8.9 mg/dL (ref 8.9–10.3)
Chloride: 111 mmol/L (ref 98–111)
Creatinine, Ser: 3.1 mg/dL — ABNORMAL HIGH (ref 0.61–1.24)
GFR, Estimated: 23 mL/min — ABNORMAL LOW (ref >60)
Glucose, Bld: 97 mg/dL (ref 70–99)
Potassium: 4.7 mmol/L (ref 3.5–5.1)
Sodium: 138 mmol/L (ref 135–145)

## 2021-03-05 LAB — MAGNESIUM: Magnesium: 2 mg/dL (ref 1.7–2.4)

## 2021-03-05 LAB — PHOSPHORUS: Phosphorus: 3.3 mg/dL (ref 2.5–4.6)

## 2021-03-06 LAB — HCV RNA DIAGNOSIS, NAA: HCV RNA, Quantitation: NOT DETECTED IU/mL

## 2021-03-06 MED FILL — MYCOPHENOLATE SODIUM 180 MG TABLET,DELAYED RELEASE: ORAL | 30 days supply | Qty: 180 | Fill #2

## 2021-03-06 MED FILL — SULFAMETHOXAZOLE 400 MG-TRIMETHOPRIM 80 MG TABLET: ORAL | 28 days supply | Qty: 12 | Fill #2

## 2021-03-06 MED FILL — ACYCLOVIR 400 MG TABLET: ORAL | 30 days supply | Qty: 60 | Fill #2

## 2021-03-07 LAB — HEPATITIS C GENOTYPE

## 2021-03-07 LAB — TACROLIMUS LEVEL: Tacrolimus (FK506) - LabCorp: 8.7 ng/mL (ref 2.0–20.0)

## 2021-03-09 MED ORDER — ASPIRIN 81 MG TABLET,DELAYED RELEASE
ORAL_TABLET | Freq: Every day | ORAL | 11 refills | 30 days
Start: 2021-03-09 — End: 2022-03-09

## 2021-03-12 DIAGNOSIS — Z940 Kidney transplant status: Principal | ICD-10-CM

## 2021-03-12 DIAGNOSIS — R82998 Other abnormal findings in urine: Principal | ICD-10-CM

## 2021-03-12 DIAGNOSIS — R7309 Other abnormal glucose: Principal | ICD-10-CM

## 2021-03-12 MED ORDER — ASPIRIN 81 MG TABLET,DELAYED RELEASE
ORAL_TABLET | Freq: Every day | ORAL | 11 refills | 30.00000 days | Status: CP
Start: 2021-03-12 — End: 2022-03-12
  Filled 2021-03-13: qty 30, 30d supply, fill #0

## 2021-03-13 MED FILL — CHLORTHALIDONE 25 MG TABLET: ORAL | 30 days supply | Qty: 60 | Fill #1

## 2021-03-14 ENCOUNTER — Institutional Professional Consult (permissible substitution): Admit: 2021-03-14 | Discharge: 2021-03-15 | Payer: MEDICARE

## 2021-03-14 ENCOUNTER — Ambulatory Visit: Admit: 2021-03-14 | Discharge: 2021-03-15 | Payer: MEDICARE

## 2021-03-14 DIAGNOSIS — Z940 Kidney transplant status: Principal | ICD-10-CM

## 2021-03-14 DIAGNOSIS — N179 Acute kidney failure, unspecified: Principal | ICD-10-CM

## 2021-03-14 DIAGNOSIS — Z79899 Other long term (current) drug therapy: Secondary | ICD-10-CM | POA: Diagnosis not present

## 2021-03-14 DIAGNOSIS — I151 Hypertension secondary to other renal disorders: Secondary | ICD-10-CM | POA: Diagnosis not present

## 2021-03-14 DIAGNOSIS — N2889 Other specified disorders of kidney and ureter: Secondary | ICD-10-CM | POA: Diagnosis not present

## 2021-03-14 DIAGNOSIS — E8770 Fluid overload, unspecified: Secondary | ICD-10-CM | POA: Diagnosis not present

## 2021-03-14 MED ORDER — FUROSEMIDE 80 MG TABLET
ORAL_TABLET | Freq: Every day | ORAL | 2 refills | 30.00000 days | Status: CP | PRN
Start: 2021-03-14 — End: 2022-03-14
  Filled 2021-03-14: qty 30, 30d supply, fill #0

## 2021-03-19 ENCOUNTER — Ambulatory Visit: Admit: 2021-03-19 | Discharge: 2021-03-20 | Payer: MEDICARE

## 2021-03-19 DIAGNOSIS — N179 Acute kidney failure, unspecified: Secondary | ICD-10-CM | POA: Diagnosis not present

## 2021-03-19 DIAGNOSIS — I701 Atherosclerosis of renal artery: Secondary | ICD-10-CM | POA: Diagnosis not present

## 2021-03-19 DIAGNOSIS — I2510 Atherosclerotic heart disease of native coronary artery without angina pectoris: Secondary | ICD-10-CM | POA: Diagnosis not present

## 2021-03-19 DIAGNOSIS — N186 End stage renal disease: Secondary | ICD-10-CM | POA: Diagnosis not present

## 2021-03-19 DIAGNOSIS — Z940 Kidney transplant status: Secondary | ICD-10-CM | POA: Diagnosis not present

## 2021-03-19 DIAGNOSIS — I120 Hypertensive chronic kidney disease with stage 5 chronic kidney disease or end stage renal disease: Secondary | ICD-10-CM | POA: Diagnosis not present

## 2021-03-19 DIAGNOSIS — E1122 Type 2 diabetes mellitus with diabetic chronic kidney disease: Secondary | ICD-10-CM | POA: Diagnosis not present

## 2021-03-19 DIAGNOSIS — Z20822 Contact with and (suspected) exposure to covid-19: Secondary | ICD-10-CM | POA: Diagnosis not present

## 2021-03-20 DIAGNOSIS — I701 Atherosclerosis of renal artery: Secondary | ICD-10-CM | POA: Diagnosis not present

## 2021-03-20 DIAGNOSIS — Z79899 Other long term (current) drug therapy: Secondary | ICD-10-CM | POA: Diagnosis not present

## 2021-03-20 DIAGNOSIS — I2510 Atherosclerotic heart disease of native coronary artery without angina pectoris: Secondary | ICD-10-CM | POA: Diagnosis not present

## 2021-03-20 DIAGNOSIS — N186 End stage renal disease: Secondary | ICD-10-CM | POA: Diagnosis not present

## 2021-03-20 DIAGNOSIS — Z940 Kidney transplant status: Secondary | ICD-10-CM | POA: Diagnosis not present

## 2021-03-20 DIAGNOSIS — I10 Essential (primary) hypertension: Secondary | ICD-10-CM | POA: Diagnosis not present

## 2021-03-20 DIAGNOSIS — N179 Acute kidney failure, unspecified: Secondary | ICD-10-CM | POA: Diagnosis not present

## 2021-03-20 DIAGNOSIS — E1122 Type 2 diabetes mellitus with diabetic chronic kidney disease: Secondary | ICD-10-CM | POA: Diagnosis not present

## 2021-03-20 DIAGNOSIS — Z20822 Contact with and (suspected) exposure to covid-19: Secondary | ICD-10-CM | POA: Diagnosis not present

## 2021-03-20 DIAGNOSIS — I120 Hypertensive chronic kidney disease with stage 5 chronic kidney disease or end stage renal disease: Secondary | ICD-10-CM | POA: Diagnosis not present

## 2021-03-20 DIAGNOSIS — D849 Immunodeficiency, unspecified: Secondary | ICD-10-CM | POA: Diagnosis not present

## 2021-03-20 MED ORDER — CARVEDILOL 12.5 MG TABLET
ORAL_TABLET | Freq: Two times a day (BID) | ORAL | 11 refills | 30 days | Status: CP
Start: 2021-03-20 — End: 2022-03-15
  Filled 2021-03-21: qty 180, 30d supply, fill #0

## 2021-03-20 MED ORDER — HYDRALAZINE 25 MG TABLET
ORAL_TABLET | Freq: Three times a day (TID) | ORAL | 11 refills | 30 days | Status: CP
Start: 2021-03-20 — End: 2022-03-20
  Filled 2021-03-20: qty 270, 30d supply, fill #0

## 2021-03-22 DIAGNOSIS — Z940 Kidney transplant status: Principal | ICD-10-CM

## 2021-03-22 MED ORDER — TACROLIMUS XR 1 MG TABLET,EXTENDED RELEASE 24 HR
ORAL_TABLET | Freq: Every day | ORAL | 3 refills | 90 days | Status: CP
Start: 2021-03-22 — End: 2022-03-22

## 2021-03-28 ENCOUNTER — Other Ambulatory Visit (HOSPITAL_COMMUNITY)
Admission: RE | Admit: 2021-03-28 | Discharge: 2021-03-28 | Disposition: A | Discharge status: Home / Self Care | Payer: Medicare Other | Source: Ambulatory Visit | Attending: Pediatric Nephrology | Admitting: Pediatric Nephrology

## 2021-03-28 DIAGNOSIS — D899 Disorder involving the immune mechanism, unspecified: Secondary | ICD-10-CM | POA: Diagnosis not present

## 2021-03-28 DIAGNOSIS — Z940 Kidney transplant status: Secondary | ICD-10-CM | POA: Insufficient documentation

## 2021-03-28 DIAGNOSIS — Z09 Encounter for follow-up examination after completed treatment for conditions other than malignant neoplasm: Secondary | ICD-10-CM | POA: Diagnosis not present

## 2021-03-28 DIAGNOSIS — Z9483 Pancreas transplant status: Secondary | ICD-10-CM | POA: Diagnosis not present

## 2021-03-28 DIAGNOSIS — Z789 Other specified health status: Secondary | ICD-10-CM | POA: Diagnosis not present

## 2021-03-28 DIAGNOSIS — E559 Vitamin D deficiency, unspecified: Secondary | ICD-10-CM | POA: Diagnosis not present

## 2021-03-28 DIAGNOSIS — B259 Cytomegaloviral disease, unspecified: Secondary | ICD-10-CM | POA: Insufficient documentation

## 2021-03-28 DIAGNOSIS — Z79899 Other long term (current) drug therapy: Secondary | ICD-10-CM | POA: Diagnosis not present

## 2021-03-28 DIAGNOSIS — N390 Urinary tract infection, site not specified: Secondary | ICD-10-CM | POA: Diagnosis not present

## 2021-03-28 DIAGNOSIS — D6489 Other specified anemias: Secondary | ICD-10-CM | POA: Diagnosis not present

## 2021-03-28 DIAGNOSIS — Z114 Encounter for screening for human immunodeficiency virus [HIV]: Secondary | ICD-10-CM | POA: Diagnosis not present

## 2021-03-28 LAB — CBC WITH DIFFERENTIAL/PLATELET
Abs Immature Granulocytes: 0.12 10*3/uL — ABNORMAL HIGH (ref 0.00–0.07)
Basophils Absolute: 0 10*3/uL (ref 0.0–0.1)
Basophils Relative: 1 %
Eosinophils Absolute: 0.1 10*3/uL (ref 0.0–0.5)
Eosinophils Relative: 2 %
HCT: 36 % — ABNORMAL LOW (ref 39.0–52.0)
Hemoglobin: 10.6 g/dL — ABNORMAL LOW (ref 13.0–17.0)
Immature Granulocytes: 1 %
Lymphocytes Relative: 6 %
Lymphs Abs: 0.6 10*3/uL — ABNORMAL LOW (ref 0.7–4.0)
MCH: 27.4 pg (ref 26.0–34.0)
MCHC: 29.4 g/dL — ABNORMAL LOW (ref 30.0–36.0)
MCV: 93 fL (ref 80.0–100.0)
Monocytes Absolute: 0.7 10*3/uL (ref 0.1–1.0)
Monocytes Relative: 8 %
Neutro Abs: 7.3 10*3/uL (ref 1.7–7.7)
Neutrophils Relative %: 82 %
Platelets: 208 10*3/uL (ref 150–400)
RBC: 3.87 MIL/uL — ABNORMAL LOW (ref 4.22–5.81)
RDW: 13.2 % (ref 11.5–15.5)
WBC: 8.9 10*3/uL (ref 4.0–10.5)
nRBC: 0 % (ref 0.0–0.2)

## 2021-03-28 LAB — BASIC METABOLIC PANEL
Anion gap: 4 — ABNORMAL LOW (ref 5–15)
BUN: 46 mg/dL — ABNORMAL HIGH (ref 6–20)
CO2: 20 mmol/L — ABNORMAL LOW (ref 22–32)
Calcium: 9 mg/dL (ref 8.9–10.3)
Chloride: 113 mmol/L — ABNORMAL HIGH (ref 98–111)
Creatinine, Ser: 2.16 mg/dL — ABNORMAL HIGH (ref 0.61–1.24)
GFR, Estimated: 35 mL/min — ABNORMAL LOW (ref >60)
Glucose, Bld: 153 mg/dL — ABNORMAL HIGH (ref 70–99)
Potassium: 5.4 mmol/L — ABNORMAL HIGH (ref 3.5–5.1)
Sodium: 137 mmol/L (ref 135–145)

## 2021-03-28 LAB — PHOSPHORUS: Phosphorus: 3.5 mg/dL (ref 2.5–4.6)

## 2021-03-28 LAB — MAGNESIUM: Magnesium: 2.5 mg/dL — ABNORMAL HIGH (ref 1.7–2.4)

## 2021-03-29 LAB — HCV RNA DIAGNOSIS, NAA: HCV RNA, Quantitation: NOT DETECTED IU/mL

## 2021-03-30 LAB — TACROLIMUS LEVEL: Tacrolimus (FK506) - LabCorp: 6.9 ng/mL (ref 2.0–20.0)

## 2021-03-30 LAB — HEPATITIS C GENOTYPE

## 2021-03-30 MED ORDER — AMLODIPINE 10 MG TABLET
ORAL_TABLET | Freq: Every day | ORAL | 11 refills | 30 days | Status: CP
Start: 2021-03-30 — End: 2022-03-30
  Filled 2021-03-30: qty 30, 30d supply, fill #0

## 2021-03-30 MED FILL — SULFAMETHOXAZOLE 400 MG-TRIMETHOPRIM 80 MG TABLET: ORAL | 28 days supply | Qty: 12 | Fill #3

## 2021-03-30 MED FILL — MYCOPHENOLATE SODIUM 180 MG TABLET,DELAYED RELEASE: ORAL | 30 days supply | Qty: 180 | Fill #3

## 2021-04-01 DIAGNOSIS — Z940 Kidney transplant status: Principal | ICD-10-CM

## 2021-04-03 ENCOUNTER — Other Ambulatory Visit (HOSPITAL_COMMUNITY)
Admission: RE | Admit: 2021-04-03 | Discharge: 2021-04-03 | Disposition: A | Discharge status: Home / Self Care | Payer: Medicare Other | Source: Ambulatory Visit | Attending: Pediatric Nephrology | Admitting: Pediatric Nephrology

## 2021-04-03 DIAGNOSIS — Z940 Kidney transplant status: Secondary | ICD-10-CM | POA: Diagnosis not present

## 2021-04-03 DIAGNOSIS — N390 Urinary tract infection, site not specified: Secondary | ICD-10-CM | POA: Diagnosis not present

## 2021-04-03 DIAGNOSIS — D899 Disorder involving the immune mechanism, unspecified: Secondary | ICD-10-CM | POA: Insufficient documentation

## 2021-04-03 DIAGNOSIS — Z789 Other specified health status: Secondary | ICD-10-CM | POA: Diagnosis not present

## 2021-04-03 DIAGNOSIS — Z79899 Other long term (current) drug therapy: Secondary | ICD-10-CM | POA: Diagnosis not present

## 2021-04-03 DIAGNOSIS — E559 Vitamin D deficiency, unspecified: Secondary | ICD-10-CM | POA: Diagnosis not present

## 2021-04-03 DIAGNOSIS — E1129 Type 2 diabetes mellitus with other diabetic kidney complication: Secondary | ICD-10-CM | POA: Insufficient documentation

## 2021-04-03 DIAGNOSIS — Z9483 Pancreas transplant status: Secondary | ICD-10-CM | POA: Diagnosis not present

## 2021-04-03 DIAGNOSIS — B259 Cytomegaloviral disease, unspecified: Secondary | ICD-10-CM | POA: Diagnosis not present

## 2021-04-03 DIAGNOSIS — D631 Anemia in chronic kidney disease: Secondary | ICD-10-CM | POA: Insufficient documentation

## 2021-04-03 DIAGNOSIS — Z09 Encounter for follow-up examination after completed treatment for conditions other than malignant neoplasm: Secondary | ICD-10-CM | POA: Insufficient documentation

## 2021-04-03 DIAGNOSIS — Z114 Encounter for screening for human immunodeficiency virus [HIV]: Secondary | ICD-10-CM | POA: Insufficient documentation

## 2021-04-03 LAB — CBC WITH DIFFERENTIAL/PLATELET
Abs Immature Granulocytes: 0.05 10*3/uL (ref 0.00–0.07)
Basophils Absolute: 0 10*3/uL (ref 0.0–0.1)
Basophils Relative: 1 %
Eosinophils Absolute: 0.2 10*3/uL (ref 0.0–0.5)
Eosinophils Relative: 2 %
HCT: 36 % — ABNORMAL LOW (ref 39.0–52.0)
Hemoglobin: 11.1 g/dL — ABNORMAL LOW (ref 13.0–17.0)
Immature Granulocytes: 1 %
Lymphocytes Relative: 7 %
Lymphs Abs: 0.6 10*3/uL — ABNORMAL LOW (ref 0.7–4.0)
MCH: 28.1 pg (ref 26.0–34.0)
MCHC: 30.8 g/dL (ref 30.0–36.0)
MCV: 91.1 fL (ref 80.0–100.0)
Monocytes Absolute: 0.6 10*3/uL (ref 0.1–1.0)
Monocytes Relative: 7 %
Neutro Abs: 7.2 10*3/uL (ref 1.7–7.7)
Neutrophils Relative %: 82 %
Platelets: 190 10*3/uL (ref 150–400)
RBC: 3.95 MIL/uL — ABNORMAL LOW (ref 4.22–5.81)
RDW: 13 % (ref 11.5–15.5)
WBC: 8.6 10*3/uL (ref 4.0–10.5)
nRBC: 0 % (ref 0.0–0.2)

## 2021-04-03 LAB — BASIC METABOLIC PANEL
Anion gap: 9 (ref 5–15)
BUN: 50 mg/dL — ABNORMAL HIGH (ref 6–20)
CO2: 21 mmol/L — ABNORMAL LOW (ref 22–32)
Calcium: 9.2 mg/dL (ref 8.9–10.3)
Chloride: 110 mmol/L (ref 98–111)
Creatinine, Ser: 2.44 mg/dL — ABNORMAL HIGH (ref 0.61–1.24)
GFR, Estimated: 30 mL/min — ABNORMAL LOW (ref >60)
Glucose, Bld: 167 mg/dL — ABNORMAL HIGH (ref 70–99)
Potassium: 5.2 mmol/L — ABNORMAL HIGH (ref 3.5–5.1)
Sodium: 140 mmol/L (ref 135–145)

## 2021-04-03 LAB — PHOSPHORUS: Phosphorus: 3.5 mg/dL (ref 2.5–4.6)

## 2021-04-03 LAB — MAGNESIUM: Magnesium: 2.5 mg/dL — ABNORMAL HIGH (ref 1.7–2.4)

## 2021-04-04 LAB — HCV RNA DIAGNOSIS, NAA: HCV RNA, Quantitation: NOT DETECTED IU/mL

## 2021-04-05 LAB — HEPATITIS C GENOTYPE

## 2021-04-05 LAB — TACROLIMUS LEVEL: Tacrolimus (FK506) - LabCorp: 5.8 ng/mL (ref 2.0–20.0)

## 2021-04-10 ENCOUNTER — Other Ambulatory Visit (HOSPITAL_COMMUNITY)
Admission: RE | Admit: 2021-04-10 | Discharge: 2021-04-10 | Disposition: A | Discharge status: Home / Self Care | Payer: Commercial Managed Care - HMO | Source: Ambulatory Visit | Attending: Surgery | Admitting: Surgery

## 2021-04-10 DIAGNOSIS — D899 Disorder involving the immune mechanism, unspecified: Secondary | ICD-10-CM | POA: Insufficient documentation

## 2021-04-10 LAB — CBC WITH DIFFERENTIAL/PLATELET
Abs Immature Granulocytes: 0.05 10*3/uL (ref 0.00–0.07)
Basophils Absolute: 0.1 10*3/uL (ref 0.0–0.1)
Basophils Relative: 1 %
Eosinophils Absolute: 0.2 10*3/uL (ref 0.0–0.5)
Eosinophils Relative: 2 %
HCT: 37.7 % — ABNORMAL LOW (ref 39.0–52.0)
Hemoglobin: 11.2 g/dL — ABNORMAL LOW (ref 13.0–17.0)
Immature Granulocytes: 1 %
Lymphocytes Relative: 7 %
Lymphs Abs: 0.6 10*3/uL — ABNORMAL LOW (ref 0.7–4.0)
MCH: 27.7 pg (ref 26.0–34.0)
MCHC: 29.7 g/dL — ABNORMAL LOW (ref 30.0–36.0)
MCV: 93.1 fL (ref 80.0–100.0)
Monocytes Absolute: 0.7 10*3/uL (ref 0.1–1.0)
Monocytes Relative: 8 %
Neutro Abs: 7.4 10*3/uL (ref 1.7–7.7)
Neutrophils Relative %: 81 %
Platelets: 205 10*3/uL (ref 150–400)
RBC: 4.05 MIL/uL — ABNORMAL LOW (ref 4.22–5.81)
RDW: 12.9 % (ref 11.5–15.5)
WBC: 9 10*3/uL (ref 4.0–10.5)
nRBC: 0 % (ref 0.0–0.2)

## 2021-04-10 LAB — BASIC METABOLIC PANEL
Anion gap: 5 (ref 5–15)
BUN: 57 mg/dL — ABNORMAL HIGH (ref 6–20)
CO2: 22 mmol/L (ref 22–32)
Calcium: 8.9 mg/dL (ref 8.9–10.3)
Chloride: 111 mmol/L (ref 98–111)
Creatinine, Ser: 2.55 mg/dL — ABNORMAL HIGH (ref 0.61–1.24)
GFR, Estimated: 29 mL/min — ABNORMAL LOW (ref >60)
Glucose, Bld: 151 mg/dL — ABNORMAL HIGH (ref 70–99)
Potassium: 5 mmol/L (ref 3.5–5.1)
Sodium: 138 mmol/L (ref 135–145)

## 2021-04-10 LAB — MAGNESIUM: Magnesium: 2.4 mg/dL (ref 1.7–2.4)

## 2021-04-10 LAB — PHOSPHORUS: Phosphorus: 3.7 mg/dL (ref 2.5–4.6)

## 2021-04-11 LAB — HCV RNA DIAGNOSIS, NAA: HCV RNA, Quantitation: NOT DETECTED IU/mL

## 2021-04-12 LAB — HEPATITIS C GENOTYPE

## 2021-04-12 LAB — TACROLIMUS LEVEL: Tacrolimus (FK506) - LabCorp: 7.1 ng/mL (ref 2.0–20.0)

## 2021-04-15 MED ORDER — INSULIN GLARGINE (U-100) 100 UNIT/ML (3 ML) SUBCUTANEOUS PEN
Freq: Every evening | SUBCUTANEOUS | 0 refills | 42 days
Start: 2021-04-15 — End: 2021-05-15

## 2021-04-17 ENCOUNTER — Other Ambulatory Visit (HOSPITAL_COMMUNITY)
Admission: RE | Admit: 2021-04-17 | Discharge: 2021-04-17 | Disposition: A | Discharge status: Home / Self Care | Payer: Medicare Other | Source: Ambulatory Visit | Attending: Pediatric Nephrology | Admitting: Pediatric Nephrology

## 2021-04-17 DIAGNOSIS — Z940 Kidney transplant status: Secondary | ICD-10-CM | POA: Insufficient documentation

## 2021-04-17 DIAGNOSIS — B259 Cytomegaloviral disease, unspecified: Secondary | ICD-10-CM | POA: Insufficient documentation

## 2021-04-17 DIAGNOSIS — Z9483 Pancreas transplant status: Secondary | ICD-10-CM | POA: Diagnosis not present

## 2021-04-17 DIAGNOSIS — Z789 Other specified health status: Secondary | ICD-10-CM | POA: Insufficient documentation

## 2021-04-17 DIAGNOSIS — N390 Urinary tract infection, site not specified: Secondary | ICD-10-CM | POA: Insufficient documentation

## 2021-04-17 DIAGNOSIS — E1122 Type 2 diabetes mellitus with diabetic chronic kidney disease: Secondary | ICD-10-CM | POA: Diagnosis not present

## 2021-04-17 DIAGNOSIS — D899 Disorder involving the immune mechanism, unspecified: Secondary | ICD-10-CM | POA: Insufficient documentation

## 2021-04-17 DIAGNOSIS — N189 Chronic kidney disease, unspecified: Secondary | ICD-10-CM | POA: Insufficient documentation

## 2021-04-17 DIAGNOSIS — D631 Anemia in chronic kidney disease: Secondary | ICD-10-CM | POA: Insufficient documentation

## 2021-04-17 DIAGNOSIS — Z09 Encounter for follow-up examination after completed treatment for conditions other than malignant neoplasm: Secondary | ICD-10-CM | POA: Diagnosis not present

## 2021-04-17 LAB — CBC WITH DIFFERENTIAL/PLATELET
Abs Immature Granulocytes: 0.04 10*3/uL (ref 0.00–0.07)
Basophils Absolute: 0 10*3/uL (ref 0.0–0.1)
Basophils Relative: 0 %
Eosinophils Absolute: 0.2 10*3/uL (ref 0.0–0.5)
Eosinophils Relative: 2 %
HCT: 35.3 % — ABNORMAL LOW (ref 39.0–52.0)
Hemoglobin: 10.8 g/dL — ABNORMAL LOW (ref 13.0–17.0)
Immature Granulocytes: 1 %
Lymphocytes Relative: 7 %
Lymphs Abs: 0.6 10*3/uL — ABNORMAL LOW (ref 0.7–4.0)
MCH: 28.1 pg (ref 26.0–34.0)
MCHC: 30.6 g/dL (ref 30.0–36.0)
MCV: 91.7 fL (ref 80.0–100.0)
Monocytes Absolute: 0.6 10*3/uL (ref 0.1–1.0)
Monocytes Relative: 8 %
Neutro Abs: 6.6 10*3/uL (ref 1.7–7.7)
Neutrophils Relative %: 82 %
Platelets: 191 10*3/uL (ref 150–400)
RBC: 3.85 MIL/uL — ABNORMAL LOW (ref 4.22–5.81)
RDW: 12.9 % (ref 11.5–15.5)
WBC: 8 10*3/uL (ref 4.0–10.5)
nRBC: 0 % (ref 0.0–0.2)

## 2021-04-17 LAB — BASIC METABOLIC PANEL
Anion gap: 6 (ref 5–15)
BUN: 48 mg/dL — ABNORMAL HIGH (ref 6–20)
CO2: 22 mmol/L (ref 22–32)
Calcium: 8.9 mg/dL (ref 8.9–10.3)
Chloride: 110 mmol/L (ref 98–111)
Creatinine, Ser: 2.53 mg/dL — ABNORMAL HIGH (ref 0.61–1.24)
GFR, Estimated: 29 mL/min — ABNORMAL LOW (ref >60)
Glucose, Bld: 149 mg/dL — ABNORMAL HIGH (ref 70–99)
Potassium: 4.3 mmol/L (ref 3.5–5.1)
Sodium: 138 mmol/L (ref 135–145)

## 2021-04-17 LAB — PHOSPHORUS: Phosphorus: 3.8 mg/dL (ref 2.5–4.6)

## 2021-04-17 LAB — MAGNESIUM: Magnesium: 2.2 mg/dL (ref 1.7–2.4)

## 2021-04-17 MED ORDER — INSULIN GLARGINE (U-100) 100 UNIT/ML (3 ML) SUBCUTANEOUS PEN
Freq: Every evening | SUBCUTANEOUS | 0 refills | 42 days
Start: 2021-04-17 — End: 2021-05-17

## 2021-04-18 LAB — HCV RNA DIAGNOSIS, NAA: HCV RNA, Quantitation: NOT DETECTED IU/mL

## 2021-04-19 LAB — TACROLIMUS LEVEL: Tacrolimus (FK506) - LabCorp: 7.4 ng/mL (ref 2.0–20.0)

## 2021-04-19 LAB — HEPATITIS C GENOTYPE

## 2021-04-19 MED FILL — ASPIRIN 81 MG TABLET,DELAYED RELEASE: ORAL | 30 days supply | Qty: 30 | Fill #1

## 2021-04-19 MED FILL — CHLORTHALIDONE 25 MG TABLET: ORAL | 30 days supply | Qty: 60 | Fill #2

## 2021-04-23 ENCOUNTER — Other Ambulatory Visit (HOSPITAL_COMMUNITY)
Admission: RE | Admit: 2021-04-23 | Discharge: 2021-04-23 | Disposition: A | Discharge status: Home / Self Care | Payer: Medicare Other | Source: Ambulatory Visit | Attending: Pediatric Nephrology | Admitting: Pediatric Nephrology

## 2021-04-23 DIAGNOSIS — D631 Anemia in chronic kidney disease: Secondary | ICD-10-CM | POA: Insufficient documentation

## 2021-04-23 DIAGNOSIS — N390 Urinary tract infection, site not specified: Secondary | ICD-10-CM | POA: Diagnosis not present

## 2021-04-23 DIAGNOSIS — E559 Vitamin D deficiency, unspecified: Secondary | ICD-10-CM | POA: Insufficient documentation

## 2021-04-23 DIAGNOSIS — B259 Cytomegaloviral disease, unspecified: Secondary | ICD-10-CM | POA: Diagnosis not present

## 2021-04-23 DIAGNOSIS — E1129 Type 2 diabetes mellitus with other diabetic kidney complication: Secondary | ICD-10-CM | POA: Insufficient documentation

## 2021-04-23 DIAGNOSIS — Z79899 Other long term (current) drug therapy: Secondary | ICD-10-CM | POA: Insufficient documentation

## 2021-04-23 DIAGNOSIS — Z9483 Pancreas transplant status: Secondary | ICD-10-CM | POA: Insufficient documentation

## 2021-04-23 DIAGNOSIS — Z09 Encounter for follow-up examination after completed treatment for conditions other than malignant neoplasm: Secondary | ICD-10-CM | POA: Insufficient documentation

## 2021-04-23 DIAGNOSIS — Z114 Encounter for screening for human immunodeficiency virus [HIV]: Secondary | ICD-10-CM | POA: Diagnosis not present

## 2021-04-23 DIAGNOSIS — D899 Disorder involving the immune mechanism, unspecified: Secondary | ICD-10-CM | POA: Diagnosis not present

## 2021-04-23 DIAGNOSIS — Z789 Other specified health status: Secondary | ICD-10-CM | POA: Diagnosis not present

## 2021-04-23 DIAGNOSIS — Z940 Kidney transplant status: Secondary | ICD-10-CM | POA: Insufficient documentation

## 2021-04-23 LAB — CBC WITH DIFFERENTIAL/PLATELET
Abs Immature Granulocytes: 0.07 10*3/uL (ref 0.00–0.07)
Basophils Absolute: 0 10*3/uL (ref 0.0–0.1)
Basophils Relative: 0 %
Eosinophils Absolute: 0.1 10*3/uL (ref 0.0–0.5)
Eosinophils Relative: 2 %
HCT: 36.8 % — ABNORMAL LOW (ref 39.0–52.0)
Hemoglobin: 10.9 g/dL — ABNORMAL LOW (ref 13.0–17.0)
Immature Granulocytes: 1 %
Lymphocytes Relative: 6 %
Lymphs Abs: 0.5 10*3/uL — ABNORMAL LOW (ref 0.7–4.0)
MCH: 27.5 pg (ref 26.0–34.0)
MCHC: 29.6 g/dL — ABNORMAL LOW (ref 30.0–36.0)
MCV: 92.7 fL (ref 80.0–100.0)
Monocytes Absolute: 0.6 10*3/uL (ref 0.1–1.0)
Monocytes Relative: 8 %
Neutro Abs: 6.4 10*3/uL (ref 1.7–7.7)
Neutrophils Relative %: 83 %
Platelets: 171 10*3/uL (ref 150–400)
RBC: 3.97 MIL/uL — ABNORMAL LOW (ref 4.22–5.81)
RDW: 13 % (ref 11.5–15.5)
WBC: 7.7 10*3/uL (ref 4.0–10.5)
nRBC: 0 % (ref 0.0–0.2)

## 2021-04-23 LAB — URINALYSIS, COMPLETE (UACMP) WITH MICROSCOPIC
Bilirubin Urine: NEGATIVE
Glucose, UA: NEGATIVE mg/dL
Hgb urine dipstick: NEGATIVE
Ketones, ur: NEGATIVE mg/dL
Nitrite: NEGATIVE
Protein, ur: NEGATIVE mg/dL
Specific Gravity, Urine: 1.015 (ref 1.005–1.030)
pH: 6 (ref 5.0–8.0)

## 2021-04-23 LAB — BASIC METABOLIC PANEL
Anion gap: 6 (ref 5–15)
BUN: 45 mg/dL — ABNORMAL HIGH (ref 6–20)
CO2: 23 mmol/L (ref 22–32)
Calcium: 9 mg/dL (ref 8.9–10.3)
Chloride: 110 mmol/L (ref 98–111)
Creatinine, Ser: 2.65 mg/dL — ABNORMAL HIGH (ref 0.61–1.24)
GFR, Estimated: 28 mL/min — ABNORMAL LOW (ref >60)
Glucose, Bld: 128 mg/dL — ABNORMAL HIGH (ref 70–99)
Potassium: 5.1 mmol/L (ref 3.5–5.1)
Sodium: 139 mmol/L (ref 135–145)

## 2021-04-23 LAB — PROTEIN / CREATININE RATIO, URINE
Creatinine, Urine: 130.27 mg/dL
Protein Creatinine Ratio: 0.1 mg/mg{Cre} (ref 0.00–0.15)
Total Protein, Urine: 13 mg/dL

## 2021-04-23 LAB — MAGNESIUM: Magnesium: 2.3 mg/dL (ref 1.7–2.4)

## 2021-04-23 LAB — PHOSPHORUS: Phosphorus: 3.8 mg/dL (ref 2.5–4.6)

## 2021-04-24 DIAGNOSIS — Z940 Kidney transplant status: Principal | ICD-10-CM

## 2021-04-24 DIAGNOSIS — R82998 Other abnormal findings in urine: Principal | ICD-10-CM

## 2021-04-24 DIAGNOSIS — G4733 Obstructive sleep apnea (adult) (pediatric): Secondary | ICD-10-CM | POA: Diagnosis not present

## 2021-04-24 LAB — URINE CULTURE: Culture: <10000 — AB

## 2021-04-24 LAB — HCV RNA DIAGNOSIS, NAA: HCV RNA, Quantitation: NOT DETECTED IU/mL

## 2021-04-25 LAB — HEPATITIS C GENOTYPE

## 2021-04-25 LAB — TACROLIMUS LEVEL: Tacrolimus (FK506) - LabCorp: 9.2 ng/mL (ref 2.0–20.0)

## 2021-04-26 ENCOUNTER — Ambulatory Visit: Admit: 2021-04-26 | Discharge: 2021-04-26 | Payer: MEDICAID

## 2021-04-26 ENCOUNTER — Institutional Professional Consult (permissible substitution): Admit: 2021-04-26 | Discharge: 2021-04-26 | Payer: MEDICAID

## 2021-04-26 DIAGNOSIS — Z940 Kidney transplant status: Principal | ICD-10-CM

## 2021-04-26 DIAGNOSIS — I2510 Atherosclerotic heart disease of native coronary artery without angina pectoris: Secondary | ICD-10-CM | POA: Diagnosis not present

## 2021-04-26 DIAGNOSIS — Z130 Encounter for screening for diseases of the blood and blood-forming organs and certain disorders involving the immune mechanism: Secondary | ICD-10-CM | POA: Diagnosis not present

## 2021-04-26 DIAGNOSIS — Z905 Acquired absence of kidney: Secondary | ICD-10-CM | POA: Diagnosis not present

## 2021-04-26 DIAGNOSIS — Z87448 Personal history of other diseases of urinary system: Secondary | ICD-10-CM | POA: Diagnosis not present

## 2021-04-26 DIAGNOSIS — Z79899 Other long term (current) drug therapy: Secondary | ICD-10-CM | POA: Diagnosis not present

## 2021-04-26 DIAGNOSIS — G4733 Obstructive sleep apnea (adult) (pediatric): Secondary | ICD-10-CM | POA: Diagnosis not present

## 2021-04-26 DIAGNOSIS — I10 Essential (primary) hypertension: Secondary | ICD-10-CM | POA: Diagnosis not present

## 2021-04-26 DIAGNOSIS — N2889 Other specified disorders of kidney and ureter: Secondary | ICD-10-CM | POA: Diagnosis not present

## 2021-04-26 DIAGNOSIS — R0600 Dyspnea, unspecified: Secondary | ICD-10-CM | POA: Diagnosis not present

## 2021-04-26 DIAGNOSIS — I151 Hypertension secondary to other renal disorders: Secondary | ICD-10-CM | POA: Diagnosis not present

## 2021-04-26 DIAGNOSIS — Z8619 Personal history of other infectious and parasitic diseases: Secondary | ICD-10-CM | POA: Diagnosis not present

## 2021-04-26 DIAGNOSIS — Z79621 Long term (current) use of calcineurin inhibitor: Secondary | ICD-10-CM | POA: Diagnosis not present

## 2021-04-26 DIAGNOSIS — E119 Type 2 diabetes mellitus without complications: Secondary | ICD-10-CM | POA: Diagnosis not present

## 2021-04-26 DIAGNOSIS — E8770 Fluid overload, unspecified: Secondary | ICD-10-CM | POA: Diagnosis not present

## 2021-04-26 DIAGNOSIS — R911 Solitary pulmonary nodule: Secondary | ICD-10-CM | POA: Diagnosis not present

## 2021-04-26 DIAGNOSIS — Z23 Encounter for immunization: Secondary | ICD-10-CM | POA: Diagnosis not present

## 2021-04-26 DIAGNOSIS — E079 Disorder of thyroid, unspecified: Secondary | ICD-10-CM | POA: Diagnosis not present

## 2021-04-26 DIAGNOSIS — Z89511 Acquired absence of right leg below knee: Secondary | ICD-10-CM | POA: Diagnosis not present

## 2021-04-26 DIAGNOSIS — Z85528 Personal history of other malignant neoplasm of kidney: Secondary | ICD-10-CM | POA: Diagnosis not present

## 2021-04-26 DIAGNOSIS — Z7982 Long term (current) use of aspirin: Secondary | ICD-10-CM | POA: Diagnosis not present

## 2021-04-26 DIAGNOSIS — Z794 Long term (current) use of insulin: Secondary | ICD-10-CM | POA: Diagnosis not present

## 2021-04-26 DIAGNOSIS — Z4822 Encounter for aftercare following kidney transplant: Secondary | ICD-10-CM | POA: Diagnosis not present

## 2021-04-26 MED ORDER — FUROSEMIDE 20 MG TABLET
ORAL_TABLET | Freq: Every day | ORAL | 11 refills | 30.00000 days | Status: CP
Start: 2021-04-26 — End: 2022-04-26
  Filled 2021-04-26: qty 90, 30d supply, fill #0

## 2021-04-26 MED ORDER — ATORVASTATIN 40 MG TABLET
ORAL_TABLET | Freq: Every day | ORAL | 3 refills | 90 days | Status: CP
Start: 2021-04-26 — End: 2022-04-26
  Filled 2021-05-01: qty 90, 90d supply, fill #0

## 2021-04-26 MED ORDER — INSULIN GLARGINE (U-100) 100 UNIT/ML (3 ML) SUBCUTANEOUS PEN
Freq: Every evening | SUBCUTANEOUS | 11 refills | 42 days | Status: CP
Start: 2021-04-26 — End: 2021-06-07
  Filled 2021-04-26: qty 15, 42d supply, fill #0

## 2021-04-26 MED FILL — MG-PLUS-PROTEIN 133 MG TABLET: ORAL | 50 days supply | Qty: 100 | Fill #1

## 2021-04-26 MED FILL — HYDRALAZINE 25 MG TABLET: ORAL | 30 days supply | Qty: 270 | Fill #0

## 2021-04-26 MED FILL — SULFAMETHOXAZOLE 400 MG-TRIMETHOPRIM 80 MG TABLET: ORAL | 28 days supply | Qty: 12 | Fill #4

## 2021-04-26 MED FILL — MYCOPHENOLATE SODIUM 180 MG TABLET,DELAYED RELEASE: ORAL | 30 days supply | Qty: 180 | Fill #4

## 2021-04-30 ENCOUNTER — Other Ambulatory Visit (HOSPITAL_COMMUNITY)
Admission: RE | Admit: 2021-04-30 | Discharge: 2021-04-30 | Disposition: A | Discharge status: Home / Self Care | Payer: Medicare Other | Source: Ambulatory Visit | Attending: Pediatric Nephrology | Admitting: Pediatric Nephrology

## 2021-04-30 DIAGNOSIS — Z9483 Pancreas transplant status: Secondary | ICD-10-CM | POA: Diagnosis not present

## 2021-04-30 DIAGNOSIS — D631 Anemia in chronic kidney disease: Secondary | ICD-10-CM | POA: Diagnosis not present

## 2021-04-30 DIAGNOSIS — D899 Disorder involving the immune mechanism, unspecified: Secondary | ICD-10-CM | POA: Insufficient documentation

## 2021-04-30 DIAGNOSIS — Z940 Kidney transplant status: Secondary | ICD-10-CM | POA: Diagnosis not present

## 2021-04-30 DIAGNOSIS — Z79899 Other long term (current) drug therapy: Secondary | ICD-10-CM | POA: Insufficient documentation

## 2021-04-30 DIAGNOSIS — Z09 Encounter for follow-up examination after completed treatment for conditions other than malignant neoplasm: Secondary | ICD-10-CM | POA: Insufficient documentation

## 2021-04-30 DIAGNOSIS — B259 Cytomegaloviral disease, unspecified: Secondary | ICD-10-CM | POA: Insufficient documentation

## 2021-04-30 DIAGNOSIS — E1122 Type 2 diabetes mellitus with diabetic chronic kidney disease: Secondary | ICD-10-CM | POA: Diagnosis not present

## 2021-04-30 DIAGNOSIS — G4733 Obstructive sleep apnea (adult) (pediatric): Secondary | ICD-10-CM | POA: Diagnosis not present

## 2021-04-30 LAB — CBC WITH DIFFERENTIAL/PLATELET
Abs Immature Granulocytes: 0.05 10*3/uL (ref 0.00–0.07)
Basophils Absolute: 0 10*3/uL (ref 0.0–0.1)
Basophils Relative: 0 %
Eosinophils Absolute: 0.1 10*3/uL (ref 0.0–0.5)
Eosinophils Relative: 1 %
HCT: 35.6 % — ABNORMAL LOW (ref 39.0–52.0)
Hemoglobin: 10.6 g/dL — ABNORMAL LOW (ref 13.0–17.0)
Immature Granulocytes: 1 %
Lymphocytes Relative: 6 %
Lymphs Abs: 0.5 10*3/uL — ABNORMAL LOW (ref 0.7–4.0)
MCH: 27 pg (ref 26.0–34.0)
MCHC: 29.8 g/dL — ABNORMAL LOW (ref 30.0–36.0)
MCV: 90.6 fL (ref 80.0–100.0)
Monocytes Absolute: 0.8 10*3/uL (ref 0.1–1.0)
Monocytes Relative: 10 %
Neutro Abs: 6.5 10*3/uL (ref 1.7–7.7)
Neutrophils Relative %: 82 %
Platelets: 171 10*3/uL (ref 150–400)
RBC: 3.93 MIL/uL — ABNORMAL LOW (ref 4.22–5.81)
RDW: 12.8 % (ref 11.5–15.5)
WBC: 8 10*3/uL (ref 4.0–10.5)
nRBC: 0 % (ref 0.0–0.2)

## 2021-04-30 LAB — PHOSPHORUS: Phosphorus: 4.6 mg/dL (ref 2.5–4.6)

## 2021-04-30 LAB — BASIC METABOLIC PANEL
Anion gap: 8 (ref 5–15)
BUN: 58 mg/dL — ABNORMAL HIGH (ref 6–20)
CO2: 24 mmol/L (ref 22–32)
Calcium: 9 mg/dL (ref 8.9–10.3)
Chloride: 108 mmol/L (ref 98–111)
Creatinine, Ser: 2.67 mg/dL — ABNORMAL HIGH (ref 0.61–1.24)
GFR, Estimated: 27 mL/min — ABNORMAL LOW (ref >60)
Glucose, Bld: 140 mg/dL — ABNORMAL HIGH (ref 70–99)
Potassium: 4.5 mmol/L (ref 3.5–5.1)
Sodium: 140 mmol/L (ref 135–145)

## 2021-04-30 LAB — MAGNESIUM: Magnesium: 2.2 mg/dL (ref 1.7–2.4)

## 2021-05-01 LAB — TACROLIMUS LEVEL: Tacrolimus (FK506) - LabCorp: 6.3 ng/mL (ref 2.0–20.0)

## 2021-05-01 LAB — HCV RNA DIAGNOSIS, NAA: HCV RNA, Quantitation: NOT DETECTED IU/mL

## 2021-05-01 MED FILL — OZEMPIC 1 MG/DOSE (4 MG/3 ML) SUBCUTANEOUS PEN INJECTOR: SUBCUTANEOUS | 84 days supply | Qty: 9 | Fill #1

## 2021-05-02 ENCOUNTER — Institutional Professional Consult (permissible substitution): Admit: 2021-05-02 | Discharge: 2021-05-03 | Payer: MEDICAID

## 2021-05-02 ENCOUNTER — Ambulatory Visit: Admit: 2021-05-02 | Discharge: 2021-05-03 | Payer: MEDICAID

## 2021-05-02 LAB — HEPATITIS C GENOTYPE

## 2021-05-03 DIAGNOSIS — Z940 Kidney transplant status: Principal | ICD-10-CM

## 2021-05-07 ENCOUNTER — Encounter: Payer: Self-pay | Admitting: Orthopedic Surgery

## 2021-05-07 ENCOUNTER — Ambulatory Visit (INDEPENDENT_AMBULATORY_CARE_PROVIDER_SITE_OTHER): Payer: Medicare Other | Admitting: Orthopedic Surgery

## 2021-05-07 ENCOUNTER — Other Ambulatory Visit: Payer: Self-pay

## 2021-05-07 DIAGNOSIS — T8789 Other complications of amputation stump: Secondary | ICD-10-CM | POA: Diagnosis not present

## 2021-05-07 DIAGNOSIS — L97919 Non-pressure chronic ulcer of unspecified part of right lower leg with unspecified severity: Secondary | ICD-10-CM | POA: Diagnosis not present

## 2021-05-07 DIAGNOSIS — Z89511 Acquired absence of right leg below knee: Secondary | ICD-10-CM

## 2021-05-07 MED FILL — AMLODIPINE 10 MG TABLET: ORAL | 30 days supply | Qty: 30 | Fill #1

## 2021-05-07 NOTE — Progress Notes (Signed)
Office Visit Note   Patient: Tim Becker           Date of Birth: [birth year]           MRN: [id number] Visit Date: 05/07/2021              Requested by: Leeroy Cha, MD [address],  [place] [address] PCP: Leeroy Cha, MD  Chief Complaint  Patient presents with   Right Leg - Follow-up    Hx right BKA      HPI: Patient is a 56 year old gentleman who presents in follow-up for right transtibial amputation.  Patient states that he has had progressive ulceration over the fibular head as well as progressive callus and dermatitis over the residual limb.  Patient states that his socket is about 30 to 56 years old.  He states he has been using peroxide on the wound with Betadine.  Assessment & Plan: Visit Diagnoses:  1. History of right below knee amputation ([place])   2. Non-pressure ulcer of stump of below knee amputation of right lower extremity (HCC)     Plan: Recommended Dial soap cleansing we will give him a felt relieving donut to unload pressure from the ulcer.  He was given a prescription for [employer] for a new socket liner materials and supplies.  Follow-Up Instructions: Return if symptoms worsen or fail to improve.   Ortho Exam  Patient is alert, oriented, no adenopathy, well-dressed, normal affect, normal respiratory effort. Examination patient has a 2 cm diameter ulcer over the fibular head that is 0.1 mm deep has healthy granulation tissue no exposed bone or tendon no signs of infection.  Patient also has an baring callus and dermatitis over the residual limb but no open ulcers.  Patient is an existing right transtibial  amputee.  Patient's current comorbidities are not expected to impact the ability to function with the prescribed prosthesis. Patient verbally communicates a strong desire to use a prosthesis. Patient currently requires mobility aids to ambulate without a prosthesis.  Expects not to use mobility aids with a new  prosthesis.  Patient is a K3 level ambulator that spends a lot of time walking around on uneven terrain over obstacles, up and down stairs, and ambulates with a variable cadence.     Imaging: No results found. No images are attached to the encounter.  Labs: Lab Results  Component Value Date   HGBA1C 8.6 (H) 04/16/2016   ESRSEDRATE 74 (H) 04/15/2016   ESRSEDRATE 127 (H) 08/10/2014   CRP 13.3 (H) 04/15/2016   CRP 8.9 (H) 08/10/2014   REPTSTATUS 04/24/2021 FINAL 04/23/2021   GRAMSTAIN  04/16/2016    RARE WBC PRESENT, PREDOMINANTLY PMN RARE GRAM NEGATIVE RODS    CULT (A) 04/23/2021    <10,000 COLONIES/mL INSIGNIFICANT GROWTH Performed at [facility], [place] [address], [place], [place] [address]    LABORGA METHICILLIN RESISTANT STAPHYLOCOCCUS AUREUS 04/16/2016     Lab Results  Component Value Date   ALBUMIN 3.2 (L) 04/17/2016   ALBUMIN 3.1 (L) 04/16/2016   ALBUMIN 3.0 (L) 04/15/2016    Lab Results  Component Value Date   MG 2.2 04/30/2021   MG 2.3 04/23/2021   MG 2.2 04/17/2021   No results found for: VD25OH  No results found for: PREALBUMIN CBC EXTENDED Latest Ref Rng & Units 04/30/2021 04/23/2021 04/17/2021  WBC 4.0 - 10.5 K/uL 8.0 7.7 8.0  RBC 4.22 - 5.81 MIL/uL 3.93(L) 3.97(L) 3.85(L)  HGB 13.0 - 17.0  g/dL 10.6(L) 10.9(L) 10.8(L)  HCT 39.0 - 52.0 % 35.6(L) 36.8(L) 35.3(L)  PLT 150 - 400 K/uL 171 171 191  NEUTROABS 1.7 - 7.7 K/uL 6.5 6.4 6.6  LYMPHSABS 0.7 - 4.0 K/uL 0.5(L) 0.5(L) 0.6(L)     There is no height or weight on file to calculate BMI.  Orders:  No orders of the defined types were placed in this encounter.  No orders of the defined types were placed in this encounter.    Procedures: No procedures performed  Clinical Data: No additional findings.  ROS:  All other systems negative, except as noted in the HPI. Review of Systems  Objective: Vital Signs: There were no vitals taken for this visit.  Specialty Comments:  No specialty  comments available.  PMFS History: Patient Active Problem List   Diagnosis Date Noted   Severe nonproliferative diabetic retinopathy of right eye ([place]) 09/12/2020   Severe nonproliferative diabetic retinopathy of left eye ([place]) 09/12/2020   Nuclear sclerotic cataract of both eyes 09/12/2020   Charcot's joint, left ankle and foot 10/29/2016   History of right below knee amputation ([place]) 10/29/2016   Diabetic polyneuropathy associated with type 2 diabetes mellitus ([place]) 10/29/2016   ESRD (end stage renal disease) on dialysis ([place]) 04/15/2016   Chronic osteomyelitis of toe of left foot ([place]) 04/15/2016   Left renal mass 07/11/2015   Biliary calculi 09/14/2014   Hemodialysis-associated hypotension 09/13/2014   H/O amputation of leg through tibia and fibula ([place]) 09/08/2014   Anemia associated with chronic renal failure 09/06/2014   Type 2 diabetes mellitus with diabetic neuropathy ([place]) 08/07/2014   Essential hypertension 08/07/2014   Obesity 08/07/2014   Hypothyroidism 08/07/2014   Kidney lump 04/06/2014   Obstructive apnea 04/06/2014   Diabetes mellitus ([place]) 03/23/2014   Patient awaiting renal transplant 03/23/2014   Asthma, moderate persistent 10/28/2013   Anemia due to blood loss 09/14/2013   Morbid obesity ([place]) 09/11/2013   Morbid (severe) obesity due to excess calories ([place]) 09/11/2013   Chronic kidney disease, stage IV (severe) ([place]) 09/10/2013   Type 2 diabetes mellitus ([place]) 09/10/2013   Mild persistent asthma with acute exacerbation 09/10/2013   Abnormal ECG 05/07/2013   CAD in native artery 05/07/2013   Past Medical History:  Diagnosis Date   Anemia    Arthritis    Cancer ([place])    Renal Tumor   CKD (chronic kidney disease)    Coronary artery disease    Diabetes ([place])    DM (diabetes mellitus) with complications ([place])    ESRD (end stage renal disease) ([place])    Pre- dialysis   Hypertension    Left kidney mass    Neuropathy    Obesity    Osteomyelitis,  chronic, ankle or foot (HCC)    PONV (postoperative nausea and vomiting)    Renal insufficiency    Patient is on Dialysis and receives M,W and F.   S/P BKA (below knee amputation) [facility]) June 2016   Right , [place], [place]   Sleep apnea    CPAP   Thyroid disease     Family History  Problem Relation Age of Onset   Diabetes Mother    Kidney disease Mother    Breast cancer Mother     Past Surgical History:  Procedure Laterality Date   BASCILIC VEIN TRANSPOSITION Left 02/17/2015   Procedure: BASCILIC VEIN TRANSPOSITION;  Surgeon: Katha Cabal, MD;  Location: [facility];  Service: Vascular;  Laterality: Left;   BELOW KNEE LEG  AMPUTATION Right    CORONARY ANGIOPLASTY WITH STENT PLACEMENT     EYE SURGERY     FOOT SURGERY     Multiple R foot surgery for infection and charcot foot   I & D EXTREMITY Left 04/16/2016   Procedure: IRRIGATION AND DEBRIDEMENT EXTREMITY/GREAT TOE AMP.;  Surgeon: Edrick Kins, DPM;  Location: [facility];  Service: Podiatry;  Laterality: Left;   JOINT REPLACEMENT     LAPAROSCOPIC NEPHRECTOMY, HAND ASSISTED Left 07/11/2015   Procedure: HAND ASSISTED LAPAROSCOPIC NEPHRECTOMY;  Surgeon: Hollice Espy, MD;  Location: [facility];  Service: Urology;  Laterality: Left;   PERIPHERAL VASCULAR CATHETERIZATION Left 12/06/2014   Procedure: A/V Shuntogram/Fistulagram;  Surgeon: Katha Cabal, MD;  Location: [facility];  Service: Cardiovascular;  Laterality: Left;   PERIPHERAL VASCULAR CATHETERIZATION Left 12/06/2014   Procedure: A/V Shunt Intervention;  Surgeon: Katha Cabal, MD;  Location: [facility];  Service: Cardiovascular;  Laterality: Left;   TONSILLECTOMY     Social History   Occupational History   Not on file  Tobacco Use   Smoking status: Never   Smokeless tobacco: Never  Vaping Use   Vaping Use: Never used  Substance and Sexual Activity   Alcohol use: No   Drug use: No   Sexual activity: Yes

## 2021-05-08 ENCOUNTER — Other Ambulatory Visit (HOSPITAL_COMMUNITY)
Admission: RE | Admit: 2021-05-08 | Discharge: 2021-05-08 | Disposition: A | Discharge status: Home / Self Care | Payer: Medicare Other | Source: Ambulatory Visit | Attending: Pediatric Nephrology | Admitting: Pediatric Nephrology

## 2021-05-08 DIAGNOSIS — Z9483 Pancreas transplant status: Secondary | ICD-10-CM | POA: Diagnosis not present

## 2021-05-08 DIAGNOSIS — Z940 Kidney transplant status: Secondary | ICD-10-CM | POA: Diagnosis not present

## 2021-05-08 DIAGNOSIS — N390 Urinary tract infection, site not specified: Secondary | ICD-10-CM | POA: Insufficient documentation

## 2021-05-08 DIAGNOSIS — D631 Anemia in chronic kidney disease: Secondary | ICD-10-CM | POA: Diagnosis not present

## 2021-05-08 DIAGNOSIS — D899 Disorder involving the immune mechanism, unspecified: Secondary | ICD-10-CM | POA: Insufficient documentation

## 2021-05-08 DIAGNOSIS — Z09 Encounter for follow-up examination after completed treatment for conditions other than malignant neoplasm: Secondary | ICD-10-CM | POA: Diagnosis not present

## 2021-05-08 DIAGNOSIS — E1122 Type 2 diabetes mellitus with diabetic chronic kidney disease: Secondary | ICD-10-CM | POA: Insufficient documentation

## 2021-05-08 DIAGNOSIS — Z79899 Other long term (current) drug therapy: Secondary | ICD-10-CM | POA: Diagnosis not present

## 2021-05-08 DIAGNOSIS — B259 Cytomegaloviral disease, unspecified: Secondary | ICD-10-CM | POA: Insufficient documentation

## 2021-05-08 LAB — CBC WITH DIFFERENTIAL/PLATELET
Abs Immature Granulocytes: 0.12 10*3/uL — ABNORMAL HIGH (ref 0.00–0.07)
Basophils Absolute: 0 10*3/uL (ref 0.0–0.1)
Basophils Relative: 0 %
Eosinophils Absolute: 0.1 10*3/uL (ref 0.0–0.5)
Eosinophils Relative: 1 %
HCT: 36.2 % — ABNORMAL LOW (ref 39.0–52.0)
Hemoglobin: 10.8 g/dL — ABNORMAL LOW (ref 13.0–17.0)
Immature Granulocytes: 1 %
Lymphocytes Relative: 3 %
Lymphs Abs: 0.3 10*3/uL — ABNORMAL LOW (ref 0.7–4.0)
MCH: 26.9 pg (ref 26.0–34.0)
MCHC: 29.8 g/dL — ABNORMAL LOW (ref 30.0–36.0)
MCV: 90 fL (ref 80.0–100.0)
Monocytes Absolute: 1 10*3/uL (ref 0.1–1.0)
Monocytes Relative: 11 %
Neutro Abs: 7.5 10*3/uL (ref 1.7–7.7)
Neutrophils Relative %: 84 %
Platelets: 189 10*3/uL (ref 150–400)
RBC: 4.02 MIL/uL — ABNORMAL LOW (ref 4.22–5.81)
RDW: 12.6 % (ref 11.5–15.5)
WBC: 9 10*3/uL (ref 4.0–10.5)
nRBC: 0 % (ref 0.0–0.2)

## 2021-05-08 LAB — BASIC METABOLIC PANEL
Anion gap: 9 (ref 5–15)
BUN: 66 mg/dL — ABNORMAL HIGH (ref 6–20)
CO2: 29 mmol/L (ref 22–32)
Calcium: 9.2 mg/dL (ref 8.9–10.3)
Chloride: 102 mmol/L (ref 98–111)
Creatinine, Ser: 3.53 mg/dL — ABNORMAL HIGH (ref 0.61–1.24)
GFR, Estimated: 19 mL/min — ABNORMAL LOW (ref >60)
Glucose, Bld: 168 mg/dL — ABNORMAL HIGH (ref 70–99)
Potassium: 4.5 mmol/L (ref 3.5–5.1)
Sodium: 140 mmol/L (ref 135–145)

## 2021-05-08 LAB — PHOSPHORUS: Phosphorus: 4.3 mg/dL (ref 2.5–4.6)

## 2021-05-08 LAB — MAGNESIUM: Magnesium: 2.3 mg/dL (ref 1.7–2.4)

## 2021-05-09 LAB — HCV RNA DIAGNOSIS, NAA: HCV RNA, Quantitation: NOT DETECTED IU/mL

## 2021-05-10 DIAGNOSIS — Z940 Kidney transplant status: Principal | ICD-10-CM

## 2021-05-10 DIAGNOSIS — E8770 Fluid overload, unspecified: Principal | ICD-10-CM

## 2021-05-10 LAB — TACROLIMUS LEVEL: Tacrolimus (FK506) - LabCorp: 6.5 ng/mL (ref 2.0–20.0)

## 2021-05-10 MED ORDER — FUROSEMIDE 80 MG TABLET
ORAL_TABLET | ORAL | 11 refills | 0 days | Status: CP
Start: 2021-05-10 — End: 2022-05-10
  Filled 2021-05-15: qty 60, 30d supply, fill #0

## 2021-05-11 LAB — HEPATITIS C GENOTYPE

## 2021-05-14 ENCOUNTER — Ambulatory Visit (INDEPENDENT_AMBULATORY_CARE_PROVIDER_SITE_OTHER): Payer: Medicare Other | Admitting: Podiatry

## 2021-05-14 ENCOUNTER — Other Ambulatory Visit (HOSPITAL_COMMUNITY)
Admission: RE | Admit: 2021-05-14 | Discharge: 2021-05-14 | Disposition: A | Discharge status: Home / Self Care | Payer: Medicare Other | Source: Ambulatory Visit | Attending: Surgery | Admitting: Surgery

## 2021-05-14 ENCOUNTER — Other Ambulatory Visit: Payer: Self-pay

## 2021-05-14 ENCOUNTER — Ambulatory Visit (INDEPENDENT_AMBULATORY_CARE_PROVIDER_SITE_OTHER): Payer: Medicare Other

## 2021-05-14 DIAGNOSIS — M7752 Other enthesopathy of left foot: Secondary | ICD-10-CM

## 2021-05-14 DIAGNOSIS — E0843 Diabetes mellitus due to underlying condition with diabetic autonomic (poly)neuropathy: Secondary | ICD-10-CM | POA: Diagnosis not present

## 2021-05-14 DIAGNOSIS — D899 Disorder involving the immune mechanism, unspecified: Secondary | ICD-10-CM | POA: Insufficient documentation

## 2021-05-14 DIAGNOSIS — L97522 Non-pressure chronic ulcer of other part of left foot with fat layer exposed: Secondary | ICD-10-CM | POA: Diagnosis not present

## 2021-05-14 LAB — CBC WITH DIFFERENTIAL/PLATELET
Abs Immature Granulocytes: 0.14 10*3/uL — ABNORMAL HIGH (ref 0.00–0.07)
Basophils Absolute: 0 10*3/uL (ref 0.0–0.1)
Basophils Relative: 0 %
Eosinophils Absolute: 0.1 10*3/uL (ref 0.0–0.5)
Eosinophils Relative: 2 %
HCT: 37.4 % — ABNORMAL LOW (ref 39.0–52.0)
Hemoglobin: 11.2 g/dL — ABNORMAL LOW (ref 13.0–17.0)
Immature Granulocytes: 2 %
Lymphocytes Relative: 6 %
Lymphs Abs: 0.6 10*3/uL — ABNORMAL LOW (ref 0.7–4.0)
MCH: 26.8 pg (ref 26.0–34.0)
MCHC: 29.9 g/dL — ABNORMAL LOW (ref 30.0–36.0)
MCV: 89.5 fL (ref 80.0–100.0)
Monocytes Absolute: 0.7 10*3/uL (ref 0.1–1.0)
Monocytes Relative: 7 %
Neutro Abs: 7.7 10*3/uL (ref 1.7–7.7)
Neutrophils Relative %: 83 %
Platelets: 213 10*3/uL (ref 150–400)
RBC: 4.18 MIL/uL — ABNORMAL LOW (ref 4.22–5.81)
RDW: 12.6 % (ref 11.5–15.5)
WBC: 9.2 10*3/uL (ref 4.0–10.5)
nRBC: 0 % (ref 0.0–0.2)

## 2021-05-14 LAB — BASIC METABOLIC PANEL
Anion gap: 8 (ref 5–15)
BUN: 75 mg/dL — ABNORMAL HIGH (ref 6–20)
CO2: 27 mmol/L (ref 22–32)
Calcium: 9.1 mg/dL (ref 8.9–10.3)
Chloride: 104 mmol/L (ref 98–111)
Creatinine, Ser: 3.84 mg/dL — ABNORMAL HIGH (ref 0.61–1.24)
GFR, Estimated: 18 mL/min — ABNORMAL LOW (ref >60)
Glucose, Bld: 155 mg/dL — ABNORMAL HIGH (ref 70–99)
Potassium: 4.3 mmol/L (ref 3.5–5.1)
Sodium: 139 mmol/L (ref 135–145)

## 2021-05-14 LAB — MAGNESIUM: Magnesium: 2.6 mg/dL — ABNORMAL HIGH (ref 1.7–2.4)

## 2021-05-14 LAB — PHOSPHORUS: Phosphorus: 4.9 mg/dL — ABNORMAL HIGH (ref 2.5–4.6)

## 2021-05-14 MED ORDER — GENTAMICIN SULFATE 0.1 % EX CREA: 1.0000 "application " | TOPICAL_CREAM | Freq: Two times a day (BID) | CUTANEOUS | 1 refills | Status: DC

## 2021-05-14 NOTE — Progress Notes (Signed)
Subjective:  56 y.o. male with PMHx of diabetes mellitus presenting today for evaluation of some thick peeling skin to the plantar aspect of the left foot.  Patient began picking at it however he was concerned and decided to come into the office to have it evaluated.  He denies history of injury.  He only noticed about 2 days ago.  He presents for further treatment and evaluation.  He does have history of left great toe amputation as well as right below-knee amputation  Past Medical History:  Diagnosis Date   Anemia    Arthritis    Cancer ([place])    Renal Tumor   CKD (chronic kidney disease)    Coronary artery disease    Diabetes ([place])    DM (diabetes mellitus) with complications (HCC)    ESRD (end stage renal disease) ([place])    Pre- dialysis   Hypertension    Left kidney mass    Neuropathy    Obesity    Osteomyelitis, chronic, ankle or foot (HCC)    PONV (postoperative nausea and vomiting)    Renal insufficiency    Patient is on Dialysis and receives M,W and F.   S/P BKA (below knee amputation) [facility]) June 2016   Right , [place], [place]   Sleep apnea    CPAP   Thyroid disease        Objective/Physical Exam General: The patient is alert and oriented x3 in no acute distress.  Dermatology:  Wound #1 noted to the to the plantar aspect of the left foot measuring approximately 0.6 x 0.4 x 0.2 cm. (LxWxD).   To the noted ulceration(s), there is no eschar. There is a moderate amount of slough, fibrin, and necrotic tissue noted. Granulation tissue and wound base is red. There is a minimal amount of serosanguineous drainage noted. There is no exposed bone muscle-tendon ligament or joint. There is no malodor. Periwound integrity is intact. Skin is warm, dry and supple bilateral lower extremities.  Vascular: Palpable pedal pulses bilaterally. No edema or erythema noted. Capillary refill within normal limits.  Neurological: Epicritic and protective threshold diminished bilaterally.    Musculoskeletal Exam: H/o BKA RLE. H/o LT great toe amputation  Assessment: 1.  Ulcer left foot secondary to diabetes mellitus 2. diabetes mellitus w/ peripheral neuropathy 3. H/o BKA RLE and LT great toe amputation   Plan of Care:  1. Patient was evaluated. 2. medically necessary excisional debridement including subcutaneous tissue was performed using a tissue nipper and a chisel blade. Excisional debridement of all the necrotic nonviable tissue down to healthy bleeding viable tissue was performed with post-debridement measurements same as pre-. 3. the wound was cleansed and dry sterile dressing applied. 4.  Prescription for gentamicin cream applied daily with a light dressing  5.  Patient states that he has not been wearing his diabetic shoes and insoles.  Recommend that he wears the diabetic shoes and insoles  6.  Patient is to return to clinic in 3 weeks   Edrick Kins, DPM [facility]  Dr. Edrick Kins, [place]                                        [place],  [address]                Office [phone]  Fax (  [phone] ° ° ° ° °

## 2021-05-15 DIAGNOSIS — Z940 Kidney transplant status: Secondary | ICD-10-CM | POA: Diagnosis not present

## 2021-05-15 LAB — HCV RNA DIAGNOSIS, NAA: HCV RNA, Quantitation: NOT DETECTED IU/mL

## 2021-05-15 MED FILL — CARVEDILOL 12.5 MG TABLET: ORAL | 30 days supply | Qty: 180 | Fill #1

## 2021-05-15 MED FILL — CHLORTHALIDONE 25 MG TABLET: ORAL | 30 days supply | Qty: 60 | Fill #3

## 2021-05-16 LAB — HEPATITIS C GENOTYPE

## 2021-05-17 DIAGNOSIS — Z9989 Dependence on other enabling machines and devices: Secondary | ICD-10-CM | POA: Diagnosis not present

## 2021-05-17 DIAGNOSIS — J310 Chronic rhinitis: Secondary | ICD-10-CM | POA: Diagnosis not present

## 2021-05-17 DIAGNOSIS — R040 Epistaxis: Secondary | ICD-10-CM | POA: Diagnosis not present

## 2021-05-17 DIAGNOSIS — G4733 Obstructive sleep apnea (adult) (pediatric): Secondary | ICD-10-CM | POA: Diagnosis not present

## 2021-05-18 ENCOUNTER — Encounter: Admit: 2021-05-18 | Discharge: 2021-05-27 | Payer: MEDICAID | Attending: Nephrology | Primary: Nephrology

## 2021-05-18 ENCOUNTER — Ambulatory Visit
Admit: 2021-05-18 | Discharge: 2021-05-27 | Disposition: A | Discharge status: Home / Self Care | Payer: MEDICAID | Admitting: Nephrology

## 2021-05-18 ENCOUNTER — Ambulatory Visit: Admit: 2021-05-18 | Discharge: 2021-05-27 | Payer: MEDICAID

## 2021-05-18 ENCOUNTER — Ambulatory Visit: Admit: 2021-05-18 | Payer: MEDICAID

## 2021-05-18 DIAGNOSIS — Z79899 Other long term (current) drug therapy: Secondary | ICD-10-CM | POA: Diagnosis not present

## 2021-05-18 DIAGNOSIS — Z89511 Acquired absence of right leg below knee: Secondary | ICD-10-CM | POA: Diagnosis not present

## 2021-05-18 DIAGNOSIS — N2889 Other specified disorders of kidney and ureter: Secondary | ICD-10-CM | POA: Diagnosis not present

## 2021-05-18 DIAGNOSIS — Z955 Presence of coronary angioplasty implant and graft: Secondary | ICD-10-CM | POA: Diagnosis not present

## 2021-05-18 DIAGNOSIS — K625 Hemorrhage of anus and rectum: Secondary | ICD-10-CM | POA: Diagnosis not present

## 2021-05-18 DIAGNOSIS — R0602 Shortness of breath: Secondary | ICD-10-CM | POA: Diagnosis not present

## 2021-05-18 DIAGNOSIS — E11319 Type 2 diabetes mellitus with unspecified diabetic retinopathy without macular edema: Secondary | ICD-10-CM | POA: Diagnosis not present

## 2021-05-18 DIAGNOSIS — E1140 Type 2 diabetes mellitus with diabetic neuropathy, unspecified: Secondary | ICD-10-CM | POA: Diagnosis not present

## 2021-05-18 DIAGNOSIS — Z794 Long term (current) use of insulin: Secondary | ICD-10-CM | POA: Diagnosis not present

## 2021-05-18 DIAGNOSIS — G4733 Obstructive sleep apnea (adult) (pediatric): Secondary | ICD-10-CM | POA: Diagnosis not present

## 2021-05-18 DIAGNOSIS — Z940 Kidney transplant status: Secondary | ICD-10-CM | POA: Diagnosis not present

## 2021-05-18 DIAGNOSIS — N170 Acute kidney failure with tubular necrosis: Secondary | ICD-10-CM | POA: Diagnosis not present

## 2021-05-18 DIAGNOSIS — R944 Abnormal results of kidney function studies: Secondary | ICD-10-CM | POA: Diagnosis not present

## 2021-05-18 DIAGNOSIS — E039 Hypothyroidism, unspecified: Secondary | ICD-10-CM | POA: Diagnosis not present

## 2021-05-18 DIAGNOSIS — S3098XA Unspecified superficial injury of anus, initial encounter: Secondary | ICD-10-CM | POA: Diagnosis not present

## 2021-05-18 DIAGNOSIS — R197 Diarrhea, unspecified: Secondary | ICD-10-CM | POA: Diagnosis not present

## 2021-05-18 DIAGNOSIS — Z20822 Contact with and (suspected) exposure to covid-19: Secondary | ICD-10-CM | POA: Diagnosis not present

## 2021-05-18 DIAGNOSIS — E1122 Type 2 diabetes mellitus with diabetic chronic kidney disease: Secondary | ICD-10-CM | POA: Diagnosis not present

## 2021-05-18 DIAGNOSIS — N189 Chronic kidney disease, unspecified: Secondary | ICD-10-CM | POA: Diagnosis not present

## 2021-05-18 DIAGNOSIS — T82898A Other specified complication of vascular prosthetic devices, implants and grafts, initial encounter: Secondary | ICD-10-CM | POA: Diagnosis not present

## 2021-05-18 DIAGNOSIS — I5032 Chronic diastolic (congestive) heart failure: Secondary | ICD-10-CM | POA: Diagnosis not present

## 2021-05-18 DIAGNOSIS — R609 Edema, unspecified: Secondary | ICD-10-CM | POA: Diagnosis not present

## 2021-05-18 DIAGNOSIS — D631 Anemia in chronic kidney disease: Secondary | ICD-10-CM | POA: Diagnosis not present

## 2021-05-18 DIAGNOSIS — I129 Hypertensive chronic kidney disease with stage 1 through stage 4 chronic kidney disease, or unspecified chronic kidney disease: Secondary | ICD-10-CM | POA: Diagnosis not present

## 2021-05-18 DIAGNOSIS — J4540 Moderate persistent asthma, uncomplicated: Secondary | ICD-10-CM | POA: Diagnosis not present

## 2021-05-18 DIAGNOSIS — D84821 Immunodeficiency due to drugs: Secondary | ICD-10-CM | POA: Diagnosis not present

## 2021-05-18 DIAGNOSIS — N179 Acute kidney failure, unspecified: Secondary | ICD-10-CM | POA: Diagnosis not present

## 2021-05-18 DIAGNOSIS — E875 Hyperkalemia: Secondary | ICD-10-CM | POA: Diagnosis not present

## 2021-05-18 DIAGNOSIS — T8619 Other complication of kidney transplant: Secondary | ICD-10-CM | POA: Diagnosis not present

## 2021-05-18 DIAGNOSIS — I130 Hypertensive heart and chronic kidney disease with heart failure and stage 1 through stage 4 chronic kidney disease, or unspecified chronic kidney disease: Secondary | ICD-10-CM | POA: Diagnosis not present

## 2021-05-18 DIAGNOSIS — I5030 Unspecified diastolic (congestive) heart failure: Secondary | ICD-10-CM | POA: Diagnosis not present

## 2021-05-18 DIAGNOSIS — I2510 Atherosclerotic heart disease of native coronary artery without angina pectoris: Secondary | ICD-10-CM | POA: Diagnosis not present

## 2021-05-18 DIAGNOSIS — N2581 Secondary hyperparathyroidism of renal origin: Secondary | ICD-10-CM | POA: Diagnosis not present

## 2021-05-18 DIAGNOSIS — R601 Generalized edema: Secondary | ICD-10-CM | POA: Diagnosis not present

## 2021-05-18 DIAGNOSIS — R7989 Other specified abnormal findings of blood chemistry: Secondary | ICD-10-CM | POA: Diagnosis not present

## 2021-05-18 DIAGNOSIS — I701 Atherosclerosis of renal artery: Secondary | ICD-10-CM | POA: Diagnosis not present

## 2021-05-19 DIAGNOSIS — R609 Edema, unspecified: Secondary | ICD-10-CM | POA: Diagnosis not present

## 2021-05-19 LAB — TACROLIMUS LEVEL: Tacrolimus (FK506) - LabCorp: 7.4 ng/mL (ref 2.0–20.0)

## 2021-05-20 DIAGNOSIS — T82898A Other specified complication of vascular prosthetic devices, implants and grafts, initial encounter: Secondary | ICD-10-CM | POA: Diagnosis not present

## 2021-05-21 DIAGNOSIS — R7989 Other specified abnormal findings of blood chemistry: Secondary | ICD-10-CM | POA: Diagnosis not present

## 2021-05-23 DIAGNOSIS — R944 Abnormal results of kidney function studies: Secondary | ICD-10-CM | POA: Diagnosis not present

## 2021-05-27 DIAGNOSIS — I701 Atherosclerosis of renal artery: Principal | ICD-10-CM

## 2021-05-27 DIAGNOSIS — T8619 Other complication of kidney transplant: Principal | ICD-10-CM

## 2021-05-27 MED ORDER — FUROSEMIDE 80 MG TABLET
ORAL_TABLET | Freq: Two times a day (BID) | ORAL | 11 refills | 0.00000 days | Status: CP | PRN
Start: 2021-05-27 — End: 2022-05-22

## 2021-05-28 ENCOUNTER — Other Ambulatory Visit (HOSPITAL_COMMUNITY)
Admission: RE | Admit: 2021-05-28 | Discharge: 2021-05-28 | Disposition: A | Discharge status: Home / Self Care | Payer: Medicare Other | Source: Ambulatory Visit | Attending: Surgery | Admitting: Surgery

## 2021-05-28 DIAGNOSIS — D899 Disorder involving the immune mechanism, unspecified: Secondary | ICD-10-CM | POA: Insufficient documentation

## 2021-05-28 LAB — BASIC METABOLIC PANEL
Anion gap: 6 (ref 5–15)
BUN: 68 mg/dL — ABNORMAL HIGH (ref 6–20)
CO2: 23 mmol/L (ref 22–32)
Calcium: 8.7 mg/dL — ABNORMAL LOW (ref 8.9–10.3)
Chloride: 110 mmol/L (ref 98–111)
Creatinine, Ser: 3.02 mg/dL — ABNORMAL HIGH (ref 0.61–1.24)
GFR, Estimated: 23 mL/min — ABNORMAL LOW (ref >60)
Glucose, Bld: 159 mg/dL — ABNORMAL HIGH (ref 70–99)
Potassium: 4.6 mmol/L (ref 3.5–5.1)
Sodium: 139 mmol/L (ref 135–145)

## 2021-05-28 LAB — CBC WITH DIFFERENTIAL/PLATELET
Abs Immature Granulocytes: 0.13 10*3/uL — ABNORMAL HIGH (ref 0.00–0.07)
Basophils Absolute: 0 10*3/uL (ref 0.0–0.1)
Basophils Relative: 0 %
Eosinophils Absolute: 0.2 10*3/uL (ref 0.0–0.5)
Eosinophils Relative: 1 %
HCT: 32.7 % — ABNORMAL LOW (ref 39.0–52.0)
Hemoglobin: 9.8 g/dL — ABNORMAL LOW (ref 13.0–17.0)
Immature Granulocytes: 1 %
Lymphocytes Relative: 5 %
Lymphs Abs: 0.6 10*3/uL — ABNORMAL LOW (ref 0.7–4.0)
MCH: 26.4 pg (ref 26.0–34.0)
MCHC: 30 g/dL (ref 30.0–36.0)
MCV: 88.1 fL (ref 80.0–100.0)
Monocytes Absolute: 0.8 10*3/uL (ref 0.1–1.0)
Monocytes Relative: 7 %
Neutro Abs: 10.4 10*3/uL — ABNORMAL HIGH (ref 1.7–7.7)
Neutrophils Relative %: 86 %
Platelets: 188 10*3/uL (ref 150–400)
RBC: 3.71 MIL/uL — ABNORMAL LOW (ref 4.22–5.81)
RDW: 12.9 % (ref 11.5–15.5)
WBC: 12.2 10*3/uL — ABNORMAL HIGH (ref 4.0–10.5)
nRBC: 0 % (ref 0.0–0.2)

## 2021-05-28 LAB — PHOSPHORUS: Phosphorus: 3.2 mg/dL (ref 2.5–4.6)

## 2021-05-28 LAB — MAGNESIUM: Magnesium: 2.4 mg/dL (ref 1.7–2.4)

## 2021-05-30 DIAGNOSIS — Z940 Kidney transplant status: Principal | ICD-10-CM

## 2021-05-30 DIAGNOSIS — J310 Chronic rhinitis: Secondary | ICD-10-CM | POA: Diagnosis not present

## 2021-05-30 LAB — HEPATITIS C GENOTYPE

## 2021-05-30 LAB — HCV RNA DIAGNOSIS, NAA: HCV RNA, Quantitation: NOT DETECTED IU/mL

## 2021-05-30 LAB — TACROLIMUS LEVEL: Tacrolimus (FK506) - LabCorp: 5.4 ng/mL (ref 2.0–20.0)

## 2021-05-31 DIAGNOSIS — Z940 Kidney transplant status: Principal | ICD-10-CM

## 2021-06-04 ENCOUNTER — Other Ambulatory Visit (HOSPITAL_COMMUNITY)
Admission: RE | Admit: 2021-06-04 | Discharge: 2021-06-04 | Disposition: A | Discharge status: Home / Self Care | Payer: Medicare Other | Source: Ambulatory Visit | Attending: Pediatric Nephrology | Admitting: Pediatric Nephrology

## 2021-06-04 ENCOUNTER — Other Ambulatory Visit: Payer: Self-pay

## 2021-06-04 ENCOUNTER — Ambulatory Visit (INDEPENDENT_AMBULATORY_CARE_PROVIDER_SITE_OTHER): Payer: Medicare Other | Admitting: Podiatry

## 2021-06-04 DIAGNOSIS — N390 Urinary tract infection, site not specified: Secondary | ICD-10-CM | POA: Insufficient documentation

## 2021-06-04 DIAGNOSIS — Z789 Other specified health status: Secondary | ICD-10-CM | POA: Insufficient documentation

## 2021-06-04 DIAGNOSIS — E559 Vitamin D deficiency, unspecified: Secondary | ICD-10-CM | POA: Diagnosis not present

## 2021-06-04 DIAGNOSIS — Z940 Kidney transplant status: Secondary | ICD-10-CM | POA: Insufficient documentation

## 2021-06-04 DIAGNOSIS — L97522 Non-pressure chronic ulcer of other part of left foot with fat layer exposed: Secondary | ICD-10-CM

## 2021-06-04 DIAGNOSIS — Z79899 Other long term (current) drug therapy: Secondary | ICD-10-CM | POA: Diagnosis not present

## 2021-06-04 DIAGNOSIS — B259 Cytomegaloviral disease, unspecified: Secondary | ICD-10-CM | POA: Diagnosis not present

## 2021-06-04 DIAGNOSIS — Z114 Encounter for screening for human immunodeficiency virus [HIV]: Secondary | ICD-10-CM | POA: Insufficient documentation

## 2021-06-04 DIAGNOSIS — E1129 Type 2 diabetes mellitus with other diabetic kidney complication: Secondary | ICD-10-CM | POA: Insufficient documentation

## 2021-06-04 DIAGNOSIS — D899 Disorder involving the immune mechanism, unspecified: Secondary | ICD-10-CM | POA: Diagnosis not present

## 2021-06-04 DIAGNOSIS — D6489 Other specified anemias: Secondary | ICD-10-CM | POA: Diagnosis not present

## 2021-06-04 DIAGNOSIS — Z9483 Pancreas transplant status: Secondary | ICD-10-CM | POA: Insufficient documentation

## 2021-06-04 DIAGNOSIS — E0843 Diabetes mellitus due to underlying condition with diabetic autonomic (poly)neuropathy: Secondary | ICD-10-CM

## 2021-06-04 DIAGNOSIS — Z09 Encounter for follow-up examination after completed treatment for conditions other than malignant neoplasm: Secondary | ICD-10-CM | POA: Diagnosis not present

## 2021-06-04 LAB — BASIC METABOLIC PANEL
Anion gap: 6 (ref 5–15)
BUN: 43 mg/dL — ABNORMAL HIGH (ref 6–20)
CO2: 24 mmol/L (ref 22–32)
Calcium: 8.9 mg/dL (ref 8.9–10.3)
Chloride: 108 mmol/L (ref 98–111)
Creatinine, Ser: 2.44 mg/dL — ABNORMAL HIGH (ref 0.61–1.24)
GFR, Estimated: 30 mL/min — ABNORMAL LOW (ref >60)
Glucose, Bld: 147 mg/dL — ABNORMAL HIGH (ref 70–99)
Potassium: 4.8 mmol/L (ref 3.5–5.1)
Sodium: 138 mmol/L (ref 135–145)

## 2021-06-04 LAB — CBC WITH DIFFERENTIAL/PLATELET
Abs Immature Granulocytes: 0.23 10*3/uL — ABNORMAL HIGH (ref 0.00–0.07)
Basophils Absolute: 0 10*3/uL (ref 0.0–0.1)
Basophils Relative: 0 %
Eosinophils Absolute: 0.1 10*3/uL (ref 0.0–0.5)
Eosinophils Relative: 1 %
HCT: 33.4 % — ABNORMAL LOW (ref 39.0–52.0)
Hemoglobin: 9.9 g/dL — ABNORMAL LOW (ref 13.0–17.0)
Immature Granulocytes: 2 %
Lymphocytes Relative: 5 %
Lymphs Abs: 0.5 10*3/uL — ABNORMAL LOW (ref 0.7–4.0)
MCH: 26.3 pg (ref 26.0–34.0)
MCHC: 29.6 g/dL — ABNORMAL LOW (ref 30.0–36.0)
MCV: 88.8 fL (ref 80.0–100.0)
Monocytes Absolute: 0.7 10*3/uL (ref 0.1–1.0)
Monocytes Relative: 7 %
Neutro Abs: 8.2 10*3/uL — ABNORMAL HIGH (ref 1.7–7.7)
Neutrophils Relative %: 85 %
Platelets: 207 10*3/uL (ref 150–400)
RBC: 3.76 MIL/uL — ABNORMAL LOW (ref 4.22–5.81)
RDW: 13.2 % (ref 11.5–15.5)
WBC: 9.8 10*3/uL (ref 4.0–10.5)
nRBC: 0 % (ref 0.0–0.2)

## 2021-06-04 LAB — PHOSPHORUS: Phosphorus: 3.1 mg/dL (ref 2.5–4.6)

## 2021-06-04 LAB — MAGNESIUM: Magnesium: 2.1 mg/dL (ref 1.7–2.4)

## 2021-06-04 NOTE — Progress Notes (Signed)
? ?  Subjective:  ?56 y.o. male with PMHx of diabetes mellitus presenting today for follow-up evaluation of an ulcer to the left forefoot.  Patient has been applying the antibiotic cream as instructed with a light dressing daily.  No new complaints at this time ? ?Past Medical History:  ?Diagnosis Date  ? Anemia   ? Arthritis   ? Cancer [facility])   ? Renal Tumor  ? CKD (chronic kidney disease)   ? Coronary artery disease   ? Diabetes ([place])   ? DM (diabetes mellitus) with complications ([place])   ? ESRD (end stage renal disease) ([place])   ? Pre- dialysis  ? Hypertension   ? Left kidney mass   ? Neuropathy   ? Obesity   ? Osteomyelitis, chronic, ankle or foot ([place])   ? PONV (postoperative nausea and vomiting)   ? Renal insufficiency   ? Patient is on Dialysis and receives M,W and F.  ? S/P BKA (below knee amputation) [facility]) June 2016  ? Right , [place], [place]  ? Sleep apnea   ? CPAP  ? Thyroid disease   ? ? ?Objective/Physical Exam ?General: The patient is alert and oriented x3 in no acute distress. ? ?Dermatology:  ?Wound #1 noted to the to the plantar aspect of the left foot has healed.  Complete reepithelialization has occurred.  No open wound noted.  Skin is warm, dry and supple left lower extremity ? ?Vascular: Palpable pedal pulses left. No edema or erythema noted. Capillary refill within normal limits. ? ?Neurological: Epicritic and protective threshold diminished left ? ?Musculoskeletal Exam: H/o BKA RLE. H/o LT great toe amputation ? ?Assessment: ?1.  Ulcer left foot secondary to diabetes mellitus; healed ?2. diabetes mellitus w/ peripheral neuropathy ?3. H/o BKA RLE and LT great toe amputation ? ? ?Plan of Care:  ?1. Patient was evaluated. ?2.  Light debridement of the callus tissue around the freshly healed wound was performed using a 312 scalpel without incident or bleeding.  Healthy viable skin underlying the callus tissue ?3.  Mechanical debridement of the nails 2-5 was performed as a courtesy for the patient  left foot ?4.  Return to clinic 3 months for routine foot care ? ?Edrick Kins, DPM ?[place] ? ?Dr. Edrick Kins, DPM  ?  ?[address]                                       ?[place], [place] [address]                ?Office [phone]  ?Fax [phone] ? ? ? ? ?

## 2021-06-05 LAB — HCV RNA DIAGNOSIS, NAA: HCV RNA, Quantitation: NOT DETECTED IU/mL

## 2021-06-06 DIAGNOSIS — Z940 Kidney transplant status: Principal | ICD-10-CM

## 2021-06-06 LAB — HEPATITIS C GENOTYPE

## 2021-06-06 LAB — TACROLIMUS LEVEL: Tacrolimus (FK506) - LabCorp: 6.9 ng/mL (ref 2.0–20.0)

## 2021-06-07 ENCOUNTER — Ambulatory Visit: Admit: 2021-06-07 | Discharge: 2021-06-08 | Payer: MEDICAID

## 2021-06-07 DIAGNOSIS — D631 Anemia in chronic kidney disease: Principal | ICD-10-CM

## 2021-06-07 DIAGNOSIS — Z940 Kidney transplant status: Principal | ICD-10-CM

## 2021-06-07 DIAGNOSIS — N189 Chronic kidney disease, unspecified: Principal | ICD-10-CM

## 2021-06-07 DIAGNOSIS — R6889 Other general symptoms and signs: Principal | ICD-10-CM

## 2021-06-07 DIAGNOSIS — E1122 Type 2 diabetes mellitus with diabetic chronic kidney disease: Secondary | ICD-10-CM | POA: Diagnosis not present

## 2021-06-07 DIAGNOSIS — I2510 Atherosclerotic heart disease of native coronary artery without angina pectoris: Secondary | ICD-10-CM | POA: Diagnosis not present

## 2021-06-07 DIAGNOSIS — E1169 Type 2 diabetes mellitus with other specified complication: Secondary | ICD-10-CM | POA: Diagnosis not present

## 2021-06-07 DIAGNOSIS — Z79899 Other long term (current) drug therapy: Secondary | ICD-10-CM | POA: Diagnosis not present

## 2021-06-07 DIAGNOSIS — N2889 Other specified disorders of kidney and ureter: Secondary | ICD-10-CM | POA: Diagnosis not present

## 2021-06-07 DIAGNOSIS — N185 Chronic kidney disease, stage 5: Secondary | ICD-10-CM | POA: Diagnosis not present

## 2021-06-07 DIAGNOSIS — Z794 Long term (current) use of insulin: Secondary | ICD-10-CM | POA: Diagnosis not present

## 2021-06-07 DIAGNOSIS — Z7982 Long term (current) use of aspirin: Secondary | ICD-10-CM | POA: Diagnosis not present

## 2021-06-07 DIAGNOSIS — G4733 Obstructive sleep apnea (adult) (pediatric): Secondary | ICD-10-CM | POA: Diagnosis not present

## 2021-06-07 DIAGNOSIS — R141 Gas pain: Secondary | ICD-10-CM | POA: Diagnosis not present

## 2021-06-07 DIAGNOSIS — Z905 Acquired absence of kidney: Secondary | ICD-10-CM | POA: Diagnosis not present

## 2021-06-07 DIAGNOSIS — I151 Hypertension secondary to other renal disorders: Secondary | ICD-10-CM | POA: Diagnosis not present

## 2021-06-07 DIAGNOSIS — Z89511 Acquired absence of right leg below knee: Secondary | ICD-10-CM | POA: Diagnosis not present

## 2021-06-07 DIAGNOSIS — M8660 Other chronic osteomyelitis, unspecified site: Secondary | ICD-10-CM | POA: Diagnosis not present

## 2021-06-07 DIAGNOSIS — E079 Disorder of thyroid, unspecified: Secondary | ICD-10-CM | POA: Diagnosis not present

## 2021-06-07 DIAGNOSIS — E8770 Fluid overload, unspecified: Secondary | ICD-10-CM | POA: Diagnosis not present

## 2021-06-07 MED FILL — ASPIRIN 81 MG TABLET,DELAYED RELEASE: ORAL | 30 days supply | Qty: 30 | Fill #2

## 2021-06-07 MED FILL — HUMALOG KWIKPEN (U-100) INSULIN 100 UNIT/ML SUBCUTANEOUS: 35 days supply | Qty: 15 | Fill #0

## 2021-06-07 MED FILL — MYCOPHENOLATE SODIUM 180 MG TABLET,DELAYED RELEASE: ORAL | 30 days supply | Qty: 180 | Fill #5

## 2021-06-07 MED FILL — LANTUS SOLOSTAR U-100 INSULIN 100 UNIT/ML (3 ML) SUBCUTANEOUS PEN: SUBCUTANEOUS | 42 days supply | Qty: 15 | Fill #0

## 2021-06-07 MED FILL — CARVEDILOL 12.5 MG TABLET: ORAL | 30 days supply | Qty: 180 | Fill #2

## 2021-06-07 MED FILL — HYDRALAZINE 25 MG TABLET: ORAL | 30 days supply | Qty: 270 | Fill #1

## 2021-06-07 MED FILL — AMLODIPINE 10 MG TABLET: ORAL | 30 days supply | Qty: 30 | Fill #2

## 2021-06-10 MED ORDER — SEMAGLUTIDE 1 MG/DOSE (4 MG/3 ML) SUBCUTANEOUS PEN INJECTOR
SUBCUTANEOUS | 3 refills | 84 days | Status: CP
Start: 2021-06-10 — End: 2022-06-10
  Filled 2021-06-19: qty 6, 28d supply, fill #0

## 2021-06-19 ENCOUNTER — Other Ambulatory Visit (HOSPITAL_COMMUNITY)
Admission: RE | Admit: 2021-06-19 | Discharge: 2021-06-19 | Disposition: A | Discharge status: Home / Self Care | Payer: Medicare Other | Source: Ambulatory Visit

## 2021-06-20 ENCOUNTER — Other Ambulatory Visit (HOSPITAL_COMMUNITY)
Admission: RE | Admit: 2021-06-20 | Discharge: 2021-06-20 | Disposition: A | Discharge status: Home / Self Care | Payer: Medicare Other | Source: Ambulatory Visit | Attending: Pediatric Nephrology | Admitting: Pediatric Nephrology

## 2021-06-20 DIAGNOSIS — D631 Anemia in chronic kidney disease: Secondary | ICD-10-CM | POA: Diagnosis not present

## 2021-06-20 DIAGNOSIS — Z09 Encounter for follow-up examination after completed treatment for conditions other than malignant neoplasm: Secondary | ICD-10-CM | POA: Diagnosis not present

## 2021-06-20 DIAGNOSIS — B259 Cytomegaloviral disease, unspecified: Secondary | ICD-10-CM | POA: Diagnosis not present

## 2021-06-20 DIAGNOSIS — Z79899 Other long term (current) drug therapy: Secondary | ICD-10-CM | POA: Diagnosis not present

## 2021-06-20 DIAGNOSIS — E559 Vitamin D deficiency, unspecified: Secondary | ICD-10-CM | POA: Insufficient documentation

## 2021-06-20 DIAGNOSIS — Z789 Other specified health status: Secondary | ICD-10-CM | POA: Insufficient documentation

## 2021-06-20 DIAGNOSIS — Z940 Kidney transplant status: Secondary | ICD-10-CM | POA: Insufficient documentation

## 2021-06-20 DIAGNOSIS — E1129 Type 2 diabetes mellitus with other diabetic kidney complication: Secondary | ICD-10-CM | POA: Insufficient documentation

## 2021-06-20 DIAGNOSIS — D899 Disorder involving the immune mechanism, unspecified: Secondary | ICD-10-CM | POA: Insufficient documentation

## 2021-06-20 DIAGNOSIS — Z114 Encounter for screening for human immunodeficiency virus [HIV]: Secondary | ICD-10-CM | POA: Diagnosis not present

## 2021-06-20 DIAGNOSIS — Z9483 Pancreas transplant status: Secondary | ICD-10-CM | POA: Diagnosis not present

## 2021-06-20 DIAGNOSIS — N390 Urinary tract infection, site not specified: Secondary | ICD-10-CM | POA: Insufficient documentation

## 2021-06-20 LAB — BASIC METABOLIC PANEL
Anion gap: 7 (ref 5–15)
BUN: 42 mg/dL — ABNORMAL HIGH (ref 6–20)
CO2: 23 mmol/L (ref 22–32)
Calcium: 8.9 mg/dL (ref 8.9–10.3)
Chloride: 107 mmol/L (ref 98–111)
Creatinine, Ser: 2.34 mg/dL — ABNORMAL HIGH (ref 0.61–1.24)
GFR, Estimated: 32 mL/min — ABNORMAL LOW (ref >60)
Glucose, Bld: 138 mg/dL — ABNORMAL HIGH (ref 70–99)
Potassium: 4.6 mmol/L (ref 3.5–5.1)
Sodium: 137 mmol/L (ref 135–145)

## 2021-06-20 LAB — CBC WITH DIFFERENTIAL/PLATELET
Abs Immature Granulocytes: 0.05 10*3/uL (ref 0.00–0.07)
Basophils Absolute: 0 10*3/uL (ref 0.0–0.1)
Basophils Relative: 0 %
Eosinophils Absolute: 0.1 10*3/uL (ref 0.0–0.5)
Eosinophils Relative: 1 %
HCT: 32.4 % — ABNORMAL LOW (ref 39.0–52.0)
Hemoglobin: 9.3 g/dL — ABNORMAL LOW (ref 13.0–17.0)
Immature Granulocytes: 1 %
Lymphocytes Relative: 6 %
Lymphs Abs: 0.5 10*3/uL — ABNORMAL LOW (ref 0.7–4.0)
MCH: 25.5 pg — ABNORMAL LOW (ref 26.0–34.0)
MCHC: 28.7 g/dL — ABNORMAL LOW (ref 30.0–36.0)
MCV: 89 fL (ref 80.0–100.0)
Monocytes Absolute: 0.6 10*3/uL (ref 0.1–1.0)
Monocytes Relative: 8 %
Neutro Abs: 6.6 10*3/uL (ref 1.7–7.7)
Neutrophils Relative %: 84 %
Platelets: 216 10*3/uL (ref 150–400)
RBC: 3.64 MIL/uL — ABNORMAL LOW (ref 4.22–5.81)
RDW: 13.3 % (ref 11.5–15.5)
WBC: 7.8 10*3/uL (ref 4.0–10.5)
nRBC: 0 % (ref 0.0–0.2)

## 2021-06-20 LAB — PHOSPHORUS: Phosphorus: 3.4 mg/dL (ref 2.5–4.6)

## 2021-06-20 LAB — MAGNESIUM: Magnesium: 2.2 mg/dL (ref 1.7–2.4)

## 2021-06-21 ENCOUNTER — Ambulatory Visit (INDEPENDENT_AMBULATORY_CARE_PROVIDER_SITE_OTHER): Payer: Medicare Other | Admitting: Family

## 2021-06-21 ENCOUNTER — Other Ambulatory Visit: Payer: Self-pay

## 2021-06-21 ENCOUNTER — Encounter: Payer: Self-pay | Admitting: Family

## 2021-06-21 VITALS — BP 118/60 | HR 83 | Temp 97.8°F | Resp 18 | Ht 68.0 in | Wt 234.6 lb

## 2021-06-21 DIAGNOSIS — E039 Hypothyroidism, unspecified: Secondary | ICD-10-CM | POA: Diagnosis not present

## 2021-06-21 DIAGNOSIS — E1140 Type 2 diabetes mellitus with diabetic neuropathy, unspecified: Secondary | ICD-10-CM

## 2021-06-21 DIAGNOSIS — E785 Hyperlipidemia, unspecified: Secondary | ICD-10-CM

## 2021-06-21 DIAGNOSIS — H905 Unspecified sensorineural hearing loss: Secondary | ICD-10-CM | POA: Diagnosis not present

## 2021-06-21 DIAGNOSIS — Z794 Long term (current) use of insulin: Secondary | ICD-10-CM | POA: Diagnosis not present

## 2021-06-21 DIAGNOSIS — Z7689 Persons encountering health services in other specified circumstances: Secondary | ICD-10-CM | POA: Diagnosis not present

## 2021-06-21 LAB — HCV RNA DIAGNOSIS, NAA: HCV RNA, Quantitation: NOT DETECTED IU/mL

## 2021-06-21 NOTE — Progress Notes (Signed)
? ?Provider: Marlowe Sax FNP-C  ? ?Arella Blinder, Nelda Bucks, NP ? ?Patient Care Team: ?Montina Dorrance, Nelda Bucks, NP as PCP - General (Family Medicine) ?Buford Dresser, MD as PCP - Cardiology (Cardiology) ? ?Extended Emergency Contact Information ?Primary Emergency Contact: [name],[name] ?Address: [address]         [place], [place] [address] [place] ?Home Phone: [phone] ?Mobile Phone: [phone] ?Relation: Spouse ? ?Code Status: Full code ?Goals of care: Advanced Directive information ? ?  06/21/2021  ?  1:54 PM  ?Advanced Directives  ?Does Patient Have a Medical Advance Directive? No  ?Would patient like information on creating a medical advance directive? No - Patient declined  ? ? ? ?Chief Complaint  ?Patient presents with  ? Establish Care  ?  New Patient.   ? ? ?HPI:  ?Pt is a 56 y.o. male seen today establish care here at [facility] for medical management of chronic diseases.  He has a medical history of hypertension, hyperlipidemia, Type 2 diabetes mellitus, coronary artery disease stent placement Jun 20, 2003, status post deceased donor kidney transplant on 12/04/2020 for native kidney disease secondary to type 2 diabetes mellitus, hypothyroidism, hard of hearing, obstructive sleep apnea, major depression, status post right below the knee amputation among others. ?Has been off dialysis since September 2022. ? ?Type 2 DM - CBG 130's - 190's before eating and 200's after meals. ?  ?Depression - has improved since he had kidney transplant.No thoughts of SI or injury to self.does get anxieties sometimes.  ? ?He does not smoke or drink any alcohol or use any illicit drugs. ? ?Health maintenance: ?According to records due for colonoscopy but thinks he had a colonoscopy in 2009/06/19.  Will obtain previous records and then update. ? ?Due for shingles vaccine.  Discussed to get vaccine at the pharmacy. ? ?He follows up with ophthalmology and has upcoming appointment in June  2023. ? ?Also has appointment with the podiatrist coming up in 2 weeks. ? ?Past Medical History:  ?Diagnosis Date  ? Anemia   ? Arthritis   ? Cancer [facility])   ? Renal Tumor  ? CKD (chronic kidney disease)   ? Coronary artery disease   ? Diabetes ([place])   ? DM (diabetes mellitus) with complications ([place])   ? ESRD (end stage renal disease) ([place])   ? Pre- dialysis  ? Hypertension   ? Left kidney mass   ? Neuropathy   ? Obesity   ? Osteomyelitis, chronic, ankle or foot ([place])   ? PONV (postoperative nausea and vomiting)   ? Renal insufficiency   ? Patient is on Dialysis and receives M,W and F.  ? S/P BKA (below knee amputation) [facility]) June 2016  ? Right , [place], [place]  ? Sleep apnea   ? CPAP  ? Thyroid disease   ? ?Past Surgical History:  ?Procedure Laterality Date  ? BASCILIC VEIN TRANSPOSITION Left 02/17/2015  ? Procedure: BASCILIC VEIN TRANSPOSITION;  Surgeon: Katha Cabal, MD;  Location: [facility];  Service: Vascular;  Laterality: Left;  ? BELOW KNEE LEG AMPUTATION Right   ? CORONARY ANGIOPLASTY WITH STENT PLACEMENT    ? EYE SURGERY    ? FOOT SURGERY    ? Multiple R foot surgery for infection and charcot foot  ? I & D EXTREMITY Left 04/16/2016  ? Procedure: IRRIGATION AND DEBRIDEMENT EXTREMITY/GREAT TOE AMP.;  Surgeon: Edrick Kins, DPM;  Location: [facility];  Service: Podiatry;  Laterality: Left;  ?  JOINT REPLACEMENT    ? LAPAROSCOPIC NEPHRECTOMY, HAND ASSISTED Left 07/11/2015  ? Procedure: HAND ASSISTED LAPAROSCOPIC NEPHRECTOMY;  Surgeon: Hollice Espy, MD;  Location: [facility];  Service: Urology;  Laterality: Left;  ? PERIPHERAL VASCULAR CATHETERIZATION Left 12/06/2014  ? Procedure: A/V Shuntogram/Fistulagram;  Surgeon: Katha Cabal, MD;  Location: [facility];  Service: Cardiovascular;  Laterality: Left;  ? PERIPHERAL VASCULAR CATHETERIZATION Left 12/06/2014  ? Procedure: A/V Shunt Intervention;  Surgeon: Katha Cabal, MD;  Location: [facility];  Service: Cardiovascular;  Laterality:  Left;  ? TONSILLECTOMY    ? ? ?Allergies  ?Allergen Reactions  ? Ferric Citrate Nausea And Vomiting  ? No Known Allergies   ? ? ?Allergies as of 06/21/2021   ? ?   Reactions  ? Ferric Citrate Nausea And Vomiting  ? No Known Allergies   ? ?  ? ?  ?Medication List  ?  ? ?  ? Accurate as of June 21, 2021  2:21 PM. If you have any questions, ask your nurse or doctor.  ?  ?  ? ?  ? ?STOP taking these medications   ? ?acetaminophen 500 MG tablet ?Commonly known as: TYLENOL ?Stopped by: Sandrea Hughs, NP ?  ?AMBULATORY NON FORMULARY MEDICATION ?Stopped by: Sandrea Hughs, NP ?  ?calcium acetate 667 MG capsule ?Commonly known as: PHOSLO ?Stopped by: Sandrea Hughs, NP ?  ?doxycycline 100 MG tablet ?Commonly known as: VIBRA-TABS ?Stopped by: Sandrea Hughs, NP ?  ?gentamicin cream 0.1 % ?Commonly known as: GARAMYCIN ?Stopped by: Sandrea Hughs, NP ?  ?oxyCODONE-acetaminophen 5-325 MG tablet ?Commonly known as: Percocet ?Stopped by: Sandrea Hughs, NP ?  ?PRESCRIPTION MEDICATION ?Stopped by: Sandrea Hughs, NP ?  ?tiZANidine 2 MG tablet ?Commonly known as: ZANAFLEX ?Stopped by: Sandrea Hughs, NP ?  ? ?  ? ?TAKE these medications   ? ?Accu-Chek Softclix Lancets lancets ?Accu-Chek Softclix Lancets ?  ?amLODipine 10 MG tablet ?Commonly known as: NORVASC ?Take 10 mg by mouth in the morning. ?  ?aspirin EC 81 MG tablet ?Take 81 mg by mouth daily. Swallow whole. ?  ?atorvastatin 40 MG tablet ?Commonly known as: LIPITOR ?Take 1 tablet (40 mg total) by mouth daily. ?  ?carvedilol 12.5 MG tablet ?Commonly known as: COREG ?Take 37.5 mg by mouth 2 (two) times daily with a meal. 9am , and 9pm ?What changed: Another medication with the same name was removed. Continue taking this medication, and follow the directions you see here. ?Changed by: Sandrea Hughs, NP ?  ?furosemide 20 MG tablet ?Commonly known as: LASIX ?Take 60 mg by mouth in the morning. 9am. ?  ?HumaLOG 100 UNIT/ML injection ?Generic drug: insulin lispro ?Inject  3-17 Units into the skin 2 (two) times daily after a meal. ?What changed: Another medication with the same name was removed. Continue taking this medication, and follow the directions you see here. ?Changed by: Sandrea Hughs, NP ?  ?hydrALAZINE 25 MG tablet ?Commonly known as: APRESOLINE ?Take 75 mg by mouth 3 (three) times daily. 9am, 3pm, and 9pm ?  ?insulin glargine 100 unit/mL Sopn ?Commonly known as: LANTUS ?Inject 35 Units into the skin daily before breakfast. ?  ?levothyroxine 75 MCG tablet ?Commonly known as: SYNTHROID ?Take 75 mcg by mouth daily before breakfast. 7am ?  ?MG-PLUS PROTEIN PO ?Take 133 mg by mouth in the morning and at bedtime. 9am and 9pm ?  ?mycophenolate 180 MG EC tablet ?Commonly known  as: MYFORTIC ?Take 180 mg by mouth 6 (six) times daily. 3 tablets at 9am and 9pm ?  ?ONETOUCH TEST VI ?by In Vitro route. ?  ?Ozempic (1 MG/DOSE) 2 MG/1.5ML Sopn ?Generic drug: Semaglutide (1 MG/DOSE) ?Inject 1 mg into the skin once a week. ?  ?tacrolimus ER 1 MG Tb24 ?Commonly known as: ENVARSUS XR ?Take 7 mg by mouth daily before breakfast. ?  ? ?  ? ? ?Review of Systems  ?Constitutional:  Negative for appetite change, chills, fatigue, fever and unexpected weight change.  ?HENT:  Positive for hearing loss. Negative for congestion, dental problem, ear discharge, ear pain, facial swelling, nosebleeds, postnasal drip, rhinorrhea, sinus pressure, sinus pain, sneezing, sore throat, tinnitus and trouble swallowing.   ?     Recently seen by ENT for sinus issues scan was normal  ?Eyes:  Negative for pain, discharge, redness, itching and visual disturbance.  ?Respiratory:  Negative for cough, chest tightness, shortness of breath and wheezing.   ?Cardiovascular:  Negative for chest pain, palpitations and leg swelling.  ?Gastrointestinal:  Positive for diarrhea. Negative for abdominal distention, abdominal pain, blood in stool, constipation, nausea and vomiting.  ?     Mostly loose stool drinking fiber which has  improved   ?Endocrine: Negative for cold intolerance, heat intolerance, polydipsia, polyphagia and polyuria.  ?Genitourinary:  Negative for difficulty urinating, dysuria, flank pain, frequency and urgency.  ?     Stat

## 2021-06-22 LAB — TACROLIMUS LEVEL: Tacrolimus (FK506) - LabCorp: 8.3 ng/mL (ref 2.0–20.0)

## 2021-06-22 NOTE — Progress Notes (Incomplete)
?Cardiology Office Note:   ? ?Date:  06/22/2021  ? ?ID:  Tim Becker, DOB [birth year], MRN [id number] ? ?PCP:  Ngetich, Nelda Bucks, NP  ?Cardiologist:  Buford Dresser, MD PhD ? ?Referring MD: Sandrea Hughs, NP  ? ?CC: follow up ? ?History of Present Illness:   ? ?Tim Becker is a 56 y.o. male with a hx of CAD, hypertension, asthma, obstructive sleep apnea, type II diabetes, end stage renal disease on dialysis, history of amputation, and obesity (BMI 39) who is seen for follow up today. I initially met him 11/13/17 as a new consult at the request of Dr. Fara Olden for evaluation and management of history of CAD. ? ?Pertinent history: ?CAD, s/p stent in 2009 in [place] (no records) ?History of renal cell carcinoma s/p left kidney nephrectomy. On hemodialysis for ESRD. Being evaluated for kidney transplant by [facility] ?S/P right BKA 2/2 chronic osteomyelitis.  ?Hypertension since the age of 56 years old (after tonsil/adenoid surgery), started on medication in his 18s. ?Type II diabetes on insulin, diagnosed in his 45s ? ?Today: ?Being followed by [facility] for renal transplant. Told he needs to lose weight and improve his diabetes prior to being listed for transplant. ? ?Blood pressures at dialysis intermittently running low, worst is when he has dialysis and then goes home and takes his medications. Now he will check his BP at home before taking his medications. Won't take his medication if his systolic is 49F-02O; will wait several hours until his BP is improved. ? ?Doing well with atorvastatin, refilled today.  ? ?ROS notable for leg falling asleep when he has sat on the toilet for a while. Now improved. Does have some distal LE neuropathy and phantom pain.  ? ?Today: ?Since his last visit, he underwent deceased donor right renal transplantation 12/04/2020. On 05/18/2021 he had an angioplasty of the renal transplant artery. ? ?Overall, ? ?He denies any palpitations, chest pain, shortness of breath, or peripheral edema.  No lightheadedness, headaches, syncope, orthopnea, or PND. ? ?(+) ? ?Past Medical History:  ?Diagnosis Date  ? Anemia   ? Arthritis   ? Cancer [facility])   ? Renal Tumor  ? CKD (chronic kidney disease)   ? Coronary artery disease   ? Diabetes ([place])   ? DM (diabetes mellitus) with complications ([place])   ? ESRD (end stage renal disease) ([place])   ? Pre- dialysis  ? History of colonoscopy 2011  ? Hypertension   ? Kidney transplanted 2022  ? Left kidney mass   ? Neuropathy   ? Obesity   ? Osteomyelitis, chronic, ankle or foot ([place])   ? PONV (postoperative nausea and vomiting)   ? Renal insufficiency   ? Patient is on Dialysis and receives M,W and F.  ? S/P BKA (below knee amputation) ([place]) 08/2014  ? Right , Rondall Allegra, [place]  ? Sleep apnea   ? CPAP  ? Thyroid disease   ? ? ?Past Surgical History:  ?Procedure Laterality Date  ? BASCILIC VEIN TRANSPOSITION Left 02/17/2015  ? Procedure: BASCILIC VEIN TRANSPOSITION;  Surgeon: Katha Cabal, MD;  Location: [facility]S;  Service: Vascular;  Laterality: Left;  ? BELOW KNEE LEG AMPUTATION Right   ? CORONARY ANGIOPLASTY WITH STENT PLACEMENT    ? EYE SURGERY    ? FOOT SURGERY    ? Multiple R foot surgery for infection and charcot foot  ? I & D EXTREMITY Left 04/16/2016  ? Procedure: IRRIGATION AND DEBRIDEMENT EXTREMITY/GREAT TOE AMP.;  Surgeon: Ruby Cola  Carver Fila, DPM;  Location: [place];  Service: Podiatry;  Laterality: Left;  ? JOINT REPLACEMENT    ? LAPAROSCOPIC NEPHRECTOMY, HAND ASSISTED Left 07/11/2015  ? Procedure: HAND ASSISTED LAPAROSCOPIC NEPHRECTOMY;  Surgeon: Hollice Espy, MD;  Location: [facility] ORS;  Service: Urology;  Laterality: Left;  ? PERIPHERAL VASCULAR CATHETERIZATION Left 12/06/2014  ? Procedure: A/V Shuntogram/Fistulagram;  Surgeon: Katha Cabal, MD;  Location: [place] [facility];  Service: Cardiovascular;  Laterality: Left;  ? PERIPHERAL VASCULAR CATHETERIZATION Left 12/06/2014  ? Procedure: A/V Shunt Intervention;  Surgeon: Katha Cabal, MD;  Location: [place] [facility];  Service: Cardiovascular;  Laterality: Left;  ? TONSILLECTOMY    ? ? ?Current Medications: ?Current Outpatient Medications on File Prior to Visit  ?Medication Sig  ? Accu-Chek Softclix Lancets lancets Accu-Chek Softclix Lancets  ? amLODipine (NORVASC) 10 MG tablet Take 10 mg by mouth in the morning. 9am  ? aspirin EC 81 MG tablet Take 81 mg by mouth daily. Swallow whole.  ? atorvastatin (LIPITOR) 40 MG tablet Take 40 mg by mouth daily. 9pm.  ? carvedilol (COREG) 12.5 MG tablet Take 37.5 mg by mouth 2 (two) times daily with a meal. 9am , and 9pm  ? furosemide (LASIX) 20 MG tablet Take 60 mg by mouth in the morning. 9am.  ? Glucose Blood (ONETOUCH TEST VI) by In Vitro route.  ? hydrALAZINE (APRESOLINE) 25 MG tablet Take 75 mg by mouth 3 (three) times daily. 9am, 3pm, and 9pm  ? insulin glargine (LANTUS) 100 unit/mL SOPN Inject 35 Units into the skin daily before breakfast.  ? insulin lispro (HUMALOG) 100 UNIT/ML injection Inject 3-17 Units into the skin 2 (two) times daily after a meal.  ? levothyroxine (SYNTHROID) 75 MCG tablet Take 75 mcg by mouth daily before breakfast. 7am  ? mycophenolate (MYFORTIC) 180 MG EC tablet Take 180 mg by mouth 6 (six) times daily. 3 tablets at 9am and 9pm  ? Semaglutide, 1 MG/DOSE, (OZEMPIC, 1 MG/DOSE,) 2 MG/1.5ML SOPN Inject 1 mg into the skin once a week. 9am  ? Specialty Vitamins Products (MG-PLUS PROTEIN PO) Take 133 mg by mouth in the morning and at bedtime. 9am and 9pm  ? tacrolimus ER (ENVARSUS XR) 1 MG TB24 Take 7 mg by mouth daily before breakfast.  ? ?No current facility-administered medications on file prior to visit.  ?  ? ?Allergies:   Ferric citrate and No known allergies  ? ?Social History  ? ?Tobacco Use  ? Smoking status: Never  ? Smokeless tobacco: Never  ?Vaping Use  ? Vaping Use: Never used  ?Substance Use Topics  ? Alcohol use: No  ? Drug use: Never  ? ? ?Family History: ?The patient's family history includes Breast cancer in his mother; Diabetes in  his mother; Kidney disease in his mother. Mother had MI, breast cancer, diabetes, kidney disease, amputation, passed away at age 71.  ? ?ROS:   ?Please see the history of present illness.   ? ?Additional pertinent ROS otherwise unremarkable ? ? ?EKGs/Labs/Other Studies Reviewed:   ? ?The following studies were reviewed today: ?Per Care Everywhere: ? ?05/24/2021 IR Renal Arteriogram Right ([place]): ?Impression: ?1.Right lower quadrant transplant renal arteriogram demonstrating severe stenosis of the origin of the transplant renal artery.  ?2.Successful balloon angioplasty using 4 mm x 3 cm [place] monorail balloon of the origin of the transplant renal artery with excellent angiographic result and no residual stenosis. ? ?Echo 05/19/2021 ([place]): ?Summary  ?  1. The left ventricle is normal in size with mildly to moderately increased  ?wall thickness.  ?  2. The left ventricular systolic function is normal, LVEF is visually  ?estimated at 65-70%.  ?  3. There is grade I diastolic dysfunction (impaired relaxation).  ?  4. The left atrium is mildly dilated in size.  ?  5. There is mild aortic valve stenosis with a peak velocity of 2.6 m/s, mean gradient of 13 mmHg, and aortic valve area of 2.5 cm2.  ?  6. The right ventricle is normal in size, with normal systolic function.  ? ?Echo 11/02/19 [facility]: [place] Rhonda W) ? 1. The left ventricle is normal in size with mildly to moderately increased wall thickness.   ? 2. The left ventricular systolic function is hyperdynamic, LVEF is visually estimated at >70%.   ? 3. There is grade I diastolic dysfunction (impaired relaxation).   ? 4. The aortic valve is trileaflet with mildly thickened leaflets with mildly reduced excursion.   ? 5. The right ventricle is normal in size, with normal systolic function.   ? ?Stress Echo 02/28/2014--listed but I cannot see results ? ?EKG:  EKG is personally reviewed. ?06/25/2021: EKG was not ordered. ?12/28/2019: normal sinus rhythm at 67  bpm ? ?Recent Labs: ?06/20/2021: BUN 42; Creatinine, Ser 2.34; Hemoglobin 9.3; Magnesium 2.2; Platelets 216; Potassium 4.6; Sodium 137  ? ?Recent Lipid Panel ?No results found for: CHOL, TRIG, HDL, CHOLHDL, VLDL, LDLC

## 2021-06-25 ENCOUNTER — Ambulatory Visit (HOSPITAL_BASED_OUTPATIENT_CLINIC_OR_DEPARTMENT_OTHER): Payer: Medicare Other | Admitting: Cardiology

## 2021-06-25 MED FILL — CHLORTHALIDONE 25 MG TABLET: ORAL | 30 days supply | Qty: 60 | Fill #4

## 2021-06-30 DIAGNOSIS — Z940 Kidney transplant status: Principal | ICD-10-CM

## 2021-07-02 ENCOUNTER — Other Ambulatory Visit (HOSPITAL_COMMUNITY)
Admission: RE | Admit: 2021-07-02 | Discharge: 2021-07-02 | Disposition: A | Discharge status: Home / Self Care | Payer: Medicare Other | Source: Ambulatory Visit | Attending: Pediatric Nephrology | Admitting: Pediatric Nephrology

## 2021-07-02 DIAGNOSIS — Z09 Encounter for follow-up examination after completed treatment for conditions other than malignant neoplasm: Secondary | ICD-10-CM | POA: Insufficient documentation

## 2021-07-02 DIAGNOSIS — N390 Urinary tract infection, site not specified: Secondary | ICD-10-CM | POA: Insufficient documentation

## 2021-07-02 DIAGNOSIS — Z114 Encounter for screening for human immunodeficiency virus [HIV]: Secondary | ICD-10-CM | POA: Diagnosis not present

## 2021-07-02 DIAGNOSIS — B259 Cytomegaloviral disease, unspecified: Secondary | ICD-10-CM | POA: Diagnosis not present

## 2021-07-02 DIAGNOSIS — D631 Anemia in chronic kidney disease: Secondary | ICD-10-CM | POA: Diagnosis not present

## 2021-07-02 DIAGNOSIS — Z789 Other specified health status: Secondary | ICD-10-CM | POA: Diagnosis not present

## 2021-07-02 DIAGNOSIS — E559 Vitamin D deficiency, unspecified: Secondary | ICD-10-CM | POA: Insufficient documentation

## 2021-07-02 DIAGNOSIS — E1129 Type 2 diabetes mellitus with other diabetic kidney complication: Secondary | ICD-10-CM | POA: Diagnosis not present

## 2021-07-02 DIAGNOSIS — Z9483 Pancreas transplant status: Secondary | ICD-10-CM | POA: Insufficient documentation

## 2021-07-02 DIAGNOSIS — Z79899 Other long term (current) drug therapy: Secondary | ICD-10-CM | POA: Insufficient documentation

## 2021-07-02 DIAGNOSIS — Z940 Kidney transplant status: Secondary | ICD-10-CM | POA: Diagnosis not present

## 2021-07-02 DIAGNOSIS — D899 Disorder involving the immune mechanism, unspecified: Secondary | ICD-10-CM | POA: Diagnosis not present

## 2021-07-02 LAB — CBC WITH DIFFERENTIAL/PLATELET
Abs Immature Granulocytes: 0.49 10*3/uL — ABNORMAL HIGH (ref 0.00–0.07)
Basophils Absolute: 0 10*3/uL (ref 0.0–0.1)
Basophils Relative: 1 %
Eosinophils Absolute: 0.1 10*3/uL (ref 0.0–0.5)
Eosinophils Relative: 1 %
HCT: 32.1 % — ABNORMAL LOW (ref 39.0–52.0)
Hemoglobin: 9.2 g/dL — ABNORMAL LOW (ref 13.0–17.0)
Immature Granulocytes: 8 %
Lymphocytes Relative: 7 %
Lymphs Abs: 0.5 10*3/uL — ABNORMAL LOW (ref 0.7–4.0)
MCH: 25.6 pg — ABNORMAL LOW (ref 26.0–34.0)
MCHC: 28.7 g/dL — ABNORMAL LOW (ref 30.0–36.0)
MCV: 89.2 fL (ref 80.0–100.0)
Monocytes Absolute: 0.7 10*3/uL (ref 0.1–1.0)
Monocytes Relative: 11 %
Neutro Abs: 4.6 10*3/uL (ref 1.7–7.7)
Neutrophils Relative %: 72 %
Platelets: 202 10*3/uL (ref 150–400)
RBC: 3.6 MIL/uL — ABNORMAL LOW (ref 4.22–5.81)
RDW: 13.7 % (ref 11.5–15.5)
WBC: 6.4 10*3/uL (ref 4.0–10.5)
nRBC: 0 % (ref 0.0–0.2)

## 2021-07-02 LAB — URINALYSIS, ROUTINE W REFLEX MICROSCOPIC
Bilirubin Urine: NEGATIVE
Glucose, UA: NEGATIVE mg/dL
Hgb urine dipstick: NEGATIVE
Ketones, ur: NEGATIVE mg/dL
Leukocytes,Ua: NEGATIVE
Nitrite: NEGATIVE
Protein, ur: NEGATIVE mg/dL
Specific Gravity, Urine: 1.013 (ref 1.005–1.030)
pH: 6 (ref 5.0–8.0)

## 2021-07-02 LAB — BASIC METABOLIC PANEL
Anion gap: 6 (ref 5–15)
BUN: 38 mg/dL — ABNORMAL HIGH (ref 6–20)
CO2: 23 mmol/L (ref 22–32)
Calcium: 8.9 mg/dL (ref 8.9–10.3)
Chloride: 109 mmol/L (ref 98–111)
Creatinine, Ser: 2.28 mg/dL — ABNORMAL HIGH (ref 0.61–1.24)
GFR, Estimated: 33 mL/min — ABNORMAL LOW (ref >60)
Glucose, Bld: 135 mg/dL — ABNORMAL HIGH (ref 70–99)
Potassium: 4.5 mmol/L (ref 3.5–5.1)
Sodium: 138 mmol/L (ref 135–145)

## 2021-07-02 LAB — MAGNESIUM: Magnesium: 1.9 mg/dL (ref 1.7–2.4)

## 2021-07-02 LAB — PHOSPHORUS: Phosphorus: 3.4 mg/dL (ref 2.5–4.6)

## 2021-07-02 MED FILL — ASPIRIN 81 MG TABLET,DELAYED RELEASE: ORAL | 30 days supply | Qty: 30 | Fill #3

## 2021-07-03 LAB — URINE CULTURE: Culture: NO GROWTH

## 2021-07-04 DIAGNOSIS — Z940 Kidney transplant status: Principal | ICD-10-CM

## 2021-07-04 LAB — TACROLIMUS LEVEL: Tacrolimus (FK506) - LabCorp: 10.6 ng/mL (ref 2.0–20.0)

## 2021-07-04 MED ORDER — TACROLIMUS XR 1 MG TABLET,EXTENDED RELEASE 24 HR
ORAL_TABLET | Freq: Every day | ORAL | 3 refills | 90 days | Status: CP
Start: 2021-07-04 — End: 2022-07-04

## 2021-07-05 DIAGNOSIS — Z940 Kidney transplant status: Principal | ICD-10-CM

## 2021-07-05 MED ORDER — TACROLIMUS XR 1 MG TABLET,EXTENDED RELEASE 24 HR
ORAL_TABLET | Freq: Every day | ORAL | 3 refills | 90 days | Status: CP
Start: 2021-07-05 — End: 2022-07-05

## 2021-07-06 DIAGNOSIS — Z89511 Acquired absence of right leg below knee: Secondary | ICD-10-CM | POA: Diagnosis not present

## 2021-07-09 MED ORDER — FAMOTIDINE 20 MG TABLET
ORAL_TABLET | Freq: Every day | ORAL | 11 refills | 30 days | PRN
Start: 2021-07-09 — End: ?

## 2021-07-10 MED ORDER — FAMOTIDINE 20 MG TABLET
ORAL_TABLET | Freq: Every day | ORAL | 11 refills | 30 days | Status: CP | PRN
Start: 2021-07-10 — End: ?
  Filled 2021-07-11: qty 30, 30d supply, fill #0

## 2021-07-11 MED FILL — MYCOPHENOLATE SODIUM 180 MG TABLET,DELAYED RELEASE: ORAL | 30 days supply | Qty: 180 | Fill #6

## 2021-07-11 MED FILL — AMLODIPINE 10 MG TABLET: ORAL | 30 days supply | Qty: 30 | Fill #3

## 2021-07-16 MED FILL — CARVEDILOL 12.5 MG TABLET: ORAL | 30 days supply | Qty: 180 | Fill #3

## 2021-07-17 ENCOUNTER — Other Ambulatory Visit (HOSPITAL_COMMUNITY)
Admission: RE | Admit: 2021-07-17 | Discharge: 2021-07-17 | Disposition: A | Discharge status: Home / Self Care | Payer: Medicare Other | Source: Ambulatory Visit | Attending: Pediatric Nephrology | Admitting: Pediatric Nephrology

## 2021-07-17 DIAGNOSIS — D899 Disorder involving the immune mechanism, unspecified: Secondary | ICD-10-CM | POA: Diagnosis not present

## 2021-07-17 DIAGNOSIS — Z79899 Other long term (current) drug therapy: Secondary | ICD-10-CM | POA: Insufficient documentation

## 2021-07-17 DIAGNOSIS — Z940 Kidney transplant status: Secondary | ICD-10-CM | POA: Diagnosis not present

## 2021-07-17 DIAGNOSIS — Z114 Encounter for screening for human immunodeficiency virus [HIV]: Secondary | ICD-10-CM | POA: Diagnosis not present

## 2021-07-17 DIAGNOSIS — E1129 Type 2 diabetes mellitus with other diabetic kidney complication: Secondary | ICD-10-CM | POA: Diagnosis not present

## 2021-07-17 DIAGNOSIS — N390 Urinary tract infection, site not specified: Secondary | ICD-10-CM | POA: Insufficient documentation

## 2021-07-17 DIAGNOSIS — D631 Anemia in chronic kidney disease: Secondary | ICD-10-CM | POA: Insufficient documentation

## 2021-07-17 DIAGNOSIS — B259 Cytomegaloviral disease, unspecified: Secondary | ICD-10-CM | POA: Insufficient documentation

## 2021-07-17 DIAGNOSIS — Z9483 Pancreas transplant status: Secondary | ICD-10-CM | POA: Insufficient documentation

## 2021-07-17 LAB — BASIC METABOLIC PANEL
Anion gap: 6 (ref 5–15)
BUN: 45 mg/dL — ABNORMAL HIGH (ref 6–20)
CO2: 22 mmol/L (ref 22–32)
Calcium: 8.8 mg/dL — ABNORMAL LOW (ref 8.9–10.3)
Chloride: 111 mmol/L (ref 98–111)
Creatinine, Ser: 2.33 mg/dL — ABNORMAL HIGH (ref 0.61–1.24)
GFR, Estimated: 32 mL/min — ABNORMAL LOW (ref >60)
Glucose, Bld: 151 mg/dL — ABNORMAL HIGH (ref 70–99)
Potassium: 4.4 mmol/L (ref 3.5–5.1)
Sodium: 139 mmol/L (ref 135–145)

## 2021-07-17 LAB — IRON AND TIBC
Iron: 90 ug/dL (ref 45–182)
Saturation Ratios: 32 % (ref 17.9–39.5)
TIBC: 277 ug/dL (ref 250–450)
UIBC: 187 ug/dL

## 2021-07-17 LAB — CBC WITH DIFFERENTIAL/PLATELET
Abs Immature Granulocytes: 0.41 10*3/uL — ABNORMAL HIGH (ref 0.00–0.07)
Basophils Absolute: 0 10*3/uL (ref 0.0–0.1)
Basophils Relative: 0 %
Eosinophils Absolute: 0.1 10*3/uL (ref 0.0–0.5)
Eosinophils Relative: 2 %
HCT: 32.8 % — ABNORMAL LOW (ref 39.0–52.0)
Hemoglobin: 9.7 g/dL — ABNORMAL LOW (ref 13.0–17.0)
Immature Granulocytes: 9 %
Lymphocytes Relative: 6 %
Lymphs Abs: 0.3 10*3/uL — ABNORMAL LOW (ref 0.7–4.0)
MCH: 26 pg (ref 26.0–34.0)
MCHC: 29.6 g/dL — ABNORMAL LOW (ref 30.0–36.0)
MCV: 87.9 fL (ref 80.0–100.0)
Monocytes Absolute: 0.6 10*3/uL (ref 0.1–1.0)
Monocytes Relative: 14 %
Neutro Abs: 3.2 10*3/uL (ref 1.7–7.7)
Neutrophils Relative %: 69 %
Platelets: 186 10*3/uL (ref 150–400)
RBC: 3.73 MIL/uL — ABNORMAL LOW (ref 4.22–5.81)
RDW: 14 % (ref 11.5–15.5)
WBC: 4.6 10*3/uL (ref 4.0–10.5)
nRBC: 0 % (ref 0.0–0.2)

## 2021-07-17 LAB — MAGNESIUM: Magnesium: 1.8 mg/dL (ref 1.7–2.4)

## 2021-07-17 LAB — PHOSPHORUS: Phosphorus: 3.5 mg/dL (ref 2.5–4.6)

## 2021-07-17 LAB — FERRITIN: Ferritin: 906 ng/mL — ABNORMAL HIGH (ref 24–336)

## 2021-07-19 LAB — TACROLIMUS LEVEL: Tacrolimus (FK506) - LabCorp: 7.2 ng/mL (ref 2.0–20.0)

## 2021-07-24 ENCOUNTER — Ambulatory Visit: Admit: 2021-07-24 | Discharge: 2021-07-25 | Payer: MEDICARE

## 2021-07-24 DIAGNOSIS — T8619 Other complication of kidney transplant: Secondary | ICD-10-CM | POA: Diagnosis not present

## 2021-07-24 DIAGNOSIS — I770 Arteriovenous fistula, acquired: Secondary | ICD-10-CM | POA: Diagnosis not present

## 2021-07-24 DIAGNOSIS — I701 Atherosclerosis of renal artery: Secondary | ICD-10-CM | POA: Diagnosis not present

## 2021-07-24 DIAGNOSIS — Z940 Kidney transplant status: Secondary | ICD-10-CM | POA: Diagnosis not present

## 2021-07-24 NOTE — Progress Notes (Deleted)
Error. No show pt

## 2021-07-25 ENCOUNTER — Ambulatory Visit (HOSPITAL_BASED_OUTPATIENT_CLINIC_OR_DEPARTMENT_OTHER): Payer: Medicare Other | Admitting: General Practice

## 2021-07-30 MED FILL — ASPIRIN 81 MG TABLET,DELAYED RELEASE: ORAL | 30 days supply | Qty: 30 | Fill #4

## 2021-07-30 MED FILL — CHLORTHALIDONE 25 MG TABLET: ORAL | 30 days supply | Qty: 60 | Fill #5

## 2021-07-30 MED FILL — ATORVASTATIN 40 MG TABLET: ORAL | 90 days supply | Qty: 90 | Fill #1

## 2021-08-03 MED FILL — AMLODIPINE 10 MG TABLET: ORAL | 30 days supply | Qty: 30 | Fill #4

## 2021-08-05 DIAGNOSIS — Z940 Kidney transplant status: Principal | ICD-10-CM

## 2021-08-06 ENCOUNTER — Other Ambulatory Visit (HOSPITAL_COMMUNITY)
Admission: RE | Admit: 2021-08-06 | Discharge: 2021-08-06 | Disposition: A | Discharge status: Home / Self Care | Payer: Medicare Other | Source: Ambulatory Visit | Attending: Pediatric Nephrology | Admitting: Pediatric Nephrology

## 2021-08-06 DIAGNOSIS — Z794 Long term (current) use of insulin: Principal | ICD-10-CM

## 2021-08-06 DIAGNOSIS — E1122 Type 2 diabetes mellitus with diabetic chronic kidney disease: Principal | ICD-10-CM

## 2021-08-06 DIAGNOSIS — N185 Chronic kidney disease, stage 5: Principal | ICD-10-CM

## 2021-08-06 DIAGNOSIS — Z9483 Pancreas transplant status: Secondary | ICD-10-CM | POA: Diagnosis not present

## 2021-08-06 DIAGNOSIS — E1129 Type 2 diabetes mellitus with other diabetic kidney complication: Secondary | ICD-10-CM | POA: Diagnosis not present

## 2021-08-06 DIAGNOSIS — Z79899 Other long term (current) drug therapy: Secondary | ICD-10-CM | POA: Insufficient documentation

## 2021-08-06 DIAGNOSIS — E559 Vitamin D deficiency, unspecified: Secondary | ICD-10-CM | POA: Diagnosis not present

## 2021-08-06 DIAGNOSIS — Z940 Kidney transplant status: Secondary | ICD-10-CM | POA: Insufficient documentation

## 2021-08-06 DIAGNOSIS — D899 Disorder involving the immune mechanism, unspecified: Secondary | ICD-10-CM | POA: Diagnosis not present

## 2021-08-06 DIAGNOSIS — Z114 Encounter for screening for human immunodeficiency virus [HIV]: Secondary | ICD-10-CM | POA: Insufficient documentation

## 2021-08-06 DIAGNOSIS — D631 Anemia in chronic kidney disease: Secondary | ICD-10-CM | POA: Diagnosis not present

## 2021-08-06 DIAGNOSIS — N390 Urinary tract infection, site not specified: Secondary | ICD-10-CM | POA: Insufficient documentation

## 2021-08-06 LAB — BASIC METABOLIC PANEL
Anion gap: 7 (ref 5–15)
BUN: 36 mg/dL — ABNORMAL HIGH (ref 6–20)
CO2: 23 mmol/L (ref 22–32)
Calcium: 9 mg/dL (ref 8.9–10.3)
Chloride: 114 mmol/L — ABNORMAL HIGH (ref 98–111)
Creatinine, Ser: 2.44 mg/dL — ABNORMAL HIGH (ref 0.61–1.24)
GFR, Estimated: 30 mL/min — ABNORMAL LOW (ref >60)
Glucose, Bld: 158 mg/dL — ABNORMAL HIGH (ref 70–99)
Potassium: 4.7 mmol/L (ref 3.5–5.1)
Sodium: 144 mmol/L (ref 135–145)

## 2021-08-06 LAB — CBC WITH DIFFERENTIAL/PLATELET
Abs Immature Granulocytes: 0.47 10*3/uL — ABNORMAL HIGH (ref 0.00–0.07)
Basophils Absolute: 0 10*3/uL (ref 0.0–0.1)
Basophils Relative: 1 %
Eosinophils Absolute: 0.1 10*3/uL (ref 0.0–0.5)
Eosinophils Relative: 2 %
HCT: 33.5 % — ABNORMAL LOW (ref 39.0–52.0)
Hemoglobin: 9.9 g/dL — ABNORMAL LOW (ref 13.0–17.0)
Immature Granulocytes: 9 %
Lymphocytes Relative: 11 %
Lymphs Abs: 0.6 10*3/uL — ABNORMAL LOW (ref 0.7–4.0)
MCH: 26 pg (ref 26.0–34.0)
MCHC: 29.6 g/dL — ABNORMAL LOW (ref 30.0–36.0)
MCV: 87.9 fL (ref 80.0–100.0)
Monocytes Absolute: 0.6 10*3/uL (ref 0.1–1.0)
Monocytes Relative: 12 %
Neutro Abs: 3.5 10*3/uL (ref 1.7–7.7)
Neutrophils Relative %: 65 %
Platelets: 182 10*3/uL (ref 150–400)
RBC: 3.81 MIL/uL — ABNORMAL LOW (ref 4.22–5.81)
RDW: 14.2 % (ref 11.5–15.5)
WBC: 5.3 10*3/uL (ref 4.0–10.5)
nRBC: 0 % (ref 0.0–0.2)

## 2021-08-06 LAB — MAGNESIUM: Magnesium: 2 mg/dL (ref 1.7–2.4)

## 2021-08-06 LAB — PHOSPHORUS: Phosphorus: 3.7 mg/dL (ref 2.5–4.6)

## 2021-08-06 MED ORDER — OZEMPIC 1 MG/DOSE (4 MG/3 ML) SUBCUTANEOUS PEN INJECTOR
SUBCUTANEOUS | 3 refills | 84 days
Start: 2021-08-06 — End: 2022-08-06

## 2021-08-07 DIAGNOSIS — Z940 Kidney transplant status: Principal | ICD-10-CM

## 2021-08-07 DIAGNOSIS — R82998 Other abnormal findings in urine: Principal | ICD-10-CM

## 2021-08-07 DIAGNOSIS — R7989 Other specified abnormal findings of blood chemistry: Principal | ICD-10-CM

## 2021-08-07 LAB — TACROLIMUS LEVEL: Tacrolimus (FK506) - LabCorp: 7.5 ng/mL (ref 2.0–20.0)

## 2021-08-07 MED ORDER — OZEMPIC 2 MG/DOSE (8 MG/3 ML) SUBCUTANEOUS PEN INJECTOR
SUBCUTANEOUS | 3 refills | 84 days | Status: CP
Start: 2021-08-07 — End: 2022-08-07
  Filled 2021-08-08: qty 9, 84d supply, fill #0

## 2021-08-08 ENCOUNTER — Ambulatory Visit: Admit: 2021-08-08 | Discharge: 2021-08-08 | Payer: MEDICARE

## 2021-08-08 ENCOUNTER — Institutional Professional Consult (permissible substitution): Admit: 2021-08-08 | Discharge: 2021-08-08 | Payer: MEDICARE

## 2021-08-08 DIAGNOSIS — N185 Chronic kidney disease, stage 5: Principal | ICD-10-CM

## 2021-08-08 DIAGNOSIS — N2889 Other specified disorders of kidney and ureter: Principal | ICD-10-CM

## 2021-08-08 DIAGNOSIS — Z940 Kidney transplant status: Principal | ICD-10-CM

## 2021-08-08 DIAGNOSIS — Z79899 Other long term (current) drug therapy: Principal | ICD-10-CM

## 2021-08-08 DIAGNOSIS — T8619 Other complication of kidney transplant: Principal | ICD-10-CM

## 2021-08-08 DIAGNOSIS — I701 Atherosclerosis of renal artery: Principal | ICD-10-CM

## 2021-08-08 DIAGNOSIS — I151 Hypertension secondary to other renal disorders: Principal | ICD-10-CM

## 2021-08-08 DIAGNOSIS — N189 Chronic kidney disease, unspecified: Principal | ICD-10-CM

## 2021-08-08 DIAGNOSIS — Z794 Long term (current) use of insulin: Principal | ICD-10-CM

## 2021-08-08 DIAGNOSIS — D631 Anemia in chronic kidney disease: Principal | ICD-10-CM

## 2021-08-08 DIAGNOSIS — E1122 Type 2 diabetes mellitus with diabetic chronic kidney disease: Principal | ICD-10-CM

## 2021-08-08 MED FILL — FAMOTIDINE 20 MG TABLET: ORAL | 30 days supply | Qty: 30 | Fill #1

## 2021-08-08 MED FILL — MYCOPHENOLATE SODIUM 180 MG TABLET,DELAYED RELEASE: ORAL | 30 days supply | Qty: 180 | Fill #7

## 2021-08-13 MED FILL — CARVEDILOL 12.5 MG TABLET: ORAL | 30 days supply | Qty: 180 | Fill #4

## 2021-08-21 ENCOUNTER — Other Ambulatory Visit (HOSPITAL_COMMUNITY)
Admission: RE | Admit: 2021-08-21 | Discharge: 2021-08-21 | Disposition: A | Discharge status: Home / Self Care | Payer: Medicare Other | Attending: Pediatric Nephrology | Admitting: Pediatric Nephrology

## 2021-08-21 DIAGNOSIS — Z940 Kidney transplant status: Secondary | ICD-10-CM | POA: Insufficient documentation

## 2021-08-21 DIAGNOSIS — D631 Anemia in chronic kidney disease: Secondary | ICD-10-CM | POA: Diagnosis not present

## 2021-08-21 DIAGNOSIS — Z789 Other specified health status: Secondary | ICD-10-CM | POA: Insufficient documentation

## 2021-08-21 DIAGNOSIS — N390 Urinary tract infection, site not specified: Secondary | ICD-10-CM | POA: Diagnosis not present

## 2021-08-21 DIAGNOSIS — E559 Vitamin D deficiency, unspecified: Secondary | ICD-10-CM | POA: Diagnosis not present

## 2021-08-21 DIAGNOSIS — B259 Cytomegaloviral disease, unspecified: Secondary | ICD-10-CM | POA: Insufficient documentation

## 2021-08-21 DIAGNOSIS — E1129 Type 2 diabetes mellitus with other diabetic kidney complication: Secondary | ICD-10-CM | POA: Insufficient documentation

## 2021-08-21 DIAGNOSIS — Z114 Encounter for screening for human immunodeficiency virus [HIV]: Secondary | ICD-10-CM | POA: Diagnosis not present

## 2021-08-21 DIAGNOSIS — Z09 Encounter for follow-up examination after completed treatment for conditions other than malignant neoplasm: Secondary | ICD-10-CM | POA: Insufficient documentation

## 2021-08-21 DIAGNOSIS — Z79899 Other long term (current) drug therapy: Secondary | ICD-10-CM | POA: Insufficient documentation

## 2021-08-21 DIAGNOSIS — D899 Disorder involving the immune mechanism, unspecified: Secondary | ICD-10-CM | POA: Insufficient documentation

## 2021-08-21 DIAGNOSIS — Z9483 Pancreas transplant status: Secondary | ICD-10-CM | POA: Diagnosis not present

## 2021-08-21 LAB — BASIC METABOLIC PANEL
Anion gap: 5 (ref 5–15)
BUN: 41 mg/dL — ABNORMAL HIGH (ref 6–20)
CO2: 25 mmol/L (ref 22–32)
Calcium: 9.2 mg/dL (ref 8.9–10.3)
Chloride: 111 mmol/L (ref 98–111)
Creatinine, Ser: 2.58 mg/dL — ABNORMAL HIGH (ref 0.61–1.24)
GFR, Estimated: 28 mL/min — ABNORMAL LOW (ref >60)
Glucose, Bld: 122 mg/dL — ABNORMAL HIGH (ref 70–99)
Potassium: 4.6 mmol/L (ref 3.5–5.1)
Sodium: 141 mmol/L (ref 135–145)

## 2021-08-21 LAB — PHOSPHORUS: Phosphorus: 3.3 mg/dL (ref 2.5–4.6)

## 2021-08-21 LAB — CBC WITH DIFFERENTIAL/PLATELET
Abs Immature Granulocytes: 0.51 10*3/uL — ABNORMAL HIGH (ref 0.00–0.07)
Basophils Absolute: 0 10*3/uL (ref 0.0–0.1)
Basophils Relative: 0 %
Eosinophils Absolute: 0.1 10*3/uL (ref 0.0–0.5)
Eosinophils Relative: 3 %
HCT: 35.5 % — ABNORMAL LOW (ref 39.0–52.0)
Hemoglobin: 10.2 g/dL — ABNORMAL LOW (ref 13.0–17.0)
Immature Granulocytes: 10 %
Lymphocytes Relative: 10 %
Lymphs Abs: 0.5 10*3/uL — ABNORMAL LOW (ref 0.7–4.0)
MCH: 25.1 pg — ABNORMAL LOW (ref 26.0–34.0)
MCHC: 28.7 g/dL — ABNORMAL LOW (ref 30.0–36.0)
MCV: 87.2 fL (ref 80.0–100.0)
Monocytes Absolute: 0.7 10*3/uL (ref 0.1–1.0)
Monocytes Relative: 13 %
Neutro Abs: 3.1 10*3/uL (ref 1.7–7.7)
Neutrophils Relative %: 64 %
Platelets: 202 10*3/uL (ref 150–400)
RBC: 4.07 MIL/uL — ABNORMAL LOW (ref 4.22–5.81)
RDW: 13.6 % (ref 11.5–15.5)
WBC: 4.9 10*3/uL (ref 4.0–10.5)
nRBC: 0 % (ref 0.0–0.2)

## 2021-08-21 LAB — MAGNESIUM: Magnesium: 1.7 mg/dL (ref 1.7–2.4)

## 2021-08-23 LAB — TACROLIMUS LEVEL: Tacrolimus (FK506) - LabCorp: 11.8 ng/mL (ref 2.0–20.0)

## 2021-08-27 MED FILL — CHLORTHALIDONE 25 MG TABLET: ORAL | 30 days supply | Qty: 60 | Fill #6

## 2021-08-29 ENCOUNTER — Other Ambulatory Visit (HOSPITAL_COMMUNITY)
Admit: 2021-08-29 | Discharge: 2021-08-29 | Disposition: A | Discharge status: Home / Self Care | Payer: Medicare Other | Source: Ambulatory Visit | Attending: Pediatric Nephrology | Admitting: Pediatric Nephrology

## 2021-08-29 ENCOUNTER — Ambulatory Visit (INDEPENDENT_AMBULATORY_CARE_PROVIDER_SITE_OTHER): Payer: Medicare Other | Admitting: Podiatry

## 2021-08-29 DIAGNOSIS — E559 Vitamin D deficiency, unspecified: Secondary | ICD-10-CM | POA: Insufficient documentation

## 2021-08-29 DIAGNOSIS — Z789 Other specified health status: Secondary | ICD-10-CM | POA: Insufficient documentation

## 2021-08-29 DIAGNOSIS — D899 Disorder involving the immune mechanism, unspecified: Secondary | ICD-10-CM | POA: Diagnosis not present

## 2021-08-29 DIAGNOSIS — N390 Urinary tract infection, site not specified: Secondary | ICD-10-CM | POA: Diagnosis not present

## 2021-08-29 DIAGNOSIS — B259 Cytomegaloviral disease, unspecified: Secondary | ICD-10-CM | POA: Insufficient documentation

## 2021-08-29 DIAGNOSIS — Z940 Kidney transplant status: Secondary | ICD-10-CM | POA: Diagnosis not present

## 2021-08-29 DIAGNOSIS — Z9483 Pancreas transplant status: Secondary | ICD-10-CM | POA: Insufficient documentation

## 2021-08-29 DIAGNOSIS — Z79899 Other long term (current) drug therapy: Secondary | ICD-10-CM | POA: Diagnosis not present

## 2021-08-29 DIAGNOSIS — L97522 Non-pressure chronic ulcer of other part of left foot with fat layer exposed: Secondary | ICD-10-CM

## 2021-08-29 DIAGNOSIS — D631 Anemia in chronic kidney disease: Secondary | ICD-10-CM | POA: Diagnosis not present

## 2021-08-29 DIAGNOSIS — N189 Chronic kidney disease, unspecified: Secondary | ICD-10-CM | POA: Insufficient documentation

## 2021-08-29 DIAGNOSIS — Z114 Encounter for screening for human immunodeficiency virus [HIV]: Secondary | ICD-10-CM | POA: Diagnosis not present

## 2021-08-29 DIAGNOSIS — E1129 Type 2 diabetes mellitus with other diabetic kidney complication: Secondary | ICD-10-CM | POA: Diagnosis not present

## 2021-08-29 DIAGNOSIS — Z09 Encounter for follow-up examination after completed treatment for conditions other than malignant neoplasm: Secondary | ICD-10-CM | POA: Diagnosis not present

## 2021-08-29 LAB — MAGNESIUM: Magnesium: 1.7 mg/dL (ref 1.7–2.4)

## 2021-08-29 LAB — BASIC METABOLIC PANEL
Anion gap: 6 (ref 5–15)
BUN: 40 mg/dL — ABNORMAL HIGH (ref 6–20)
CO2: 22 mmol/L (ref 22–32)
Calcium: 9.1 mg/dL (ref 8.9–10.3)
Chloride: 113 mmol/L — ABNORMAL HIGH (ref 98–111)
Creatinine, Ser: 2.45 mg/dL — ABNORMAL HIGH (ref 0.61–1.24)
GFR, Estimated: 30 mL/min — ABNORMAL LOW (ref >60)
Glucose, Bld: 132 mg/dL — ABNORMAL HIGH (ref 70–99)
Potassium: 5 mmol/L (ref 3.5–5.1)
Sodium: 141 mmol/L (ref 135–145)

## 2021-08-29 LAB — CBC WITH DIFFERENTIAL/PLATELET
Abs Immature Granulocytes: 0.49 10*3/uL — ABNORMAL HIGH (ref 0.00–0.07)
Basophils Absolute: 0 10*3/uL (ref 0.0–0.1)
Basophils Relative: 1 %
Eosinophils Absolute: 0.1 10*3/uL (ref 0.0–0.5)
Eosinophils Relative: 1 %
HCT: 35.6 % — ABNORMAL LOW (ref 39.0–52.0)
Hemoglobin: 10.3 g/dL — ABNORMAL LOW (ref 13.0–17.0)
Immature Granulocytes: 10 %
Lymphocytes Relative: 8 %
Lymphs Abs: 0.4 10*3/uL — ABNORMAL LOW (ref 0.7–4.0)
MCH: 25.1 pg — ABNORMAL LOW (ref 26.0–34.0)
MCHC: 28.9 g/dL — ABNORMAL LOW (ref 30.0–36.0)
MCV: 86.8 fL (ref 80.0–100.0)
Monocytes Absolute: 0.6 10*3/uL (ref 0.1–1.0)
Monocytes Relative: 12 %
Neutro Abs: 3.4 10*3/uL (ref 1.7–7.7)
Neutrophils Relative %: 68 %
Platelets: 188 10*3/uL (ref 150–400)
RBC: 4.1 MIL/uL — ABNORMAL LOW (ref 4.22–5.81)
RDW: 13.7 % (ref 11.5–15.5)
WBC: 5 10*3/uL (ref 4.0–10.5)
nRBC: 0 % (ref 0.0–0.2)

## 2021-08-29 LAB — PHOSPHORUS: Phosphorus: 3.6 mg/dL (ref 2.5–4.6)

## 2021-08-29 NOTE — Progress Notes (Signed)
Subjective:  56 y.o. male with PMHx of diabetes mellitus, ESRD, peripheral neuropathy past surgical history of BKA RLE, hallux amputation LLE presenting today for evaluation of a new ulcer development to the distal tip of the left second toe.  Patient states that the wound developed about 2 weeks ago.  He has noticed the callus to the distal tip of the toe.  He has a prescription already for some gentamicin cream that he has been applying with a Band-Aid.  He presents for further treatment and evaluation   Past Medical History:  Diagnosis Date   Anemia    Arthritis    Cancer ([place])    Renal Tumor   CKD (chronic kidney disease)    Coronary artery disease    Diabetes ([place])    DM (diabetes mellitus) with complications ([place])    ESRD (end stage renal disease) ([place])    Pre- dialysis   History of colonoscopy 2011   Hypertension    Kidney transplanted 2022   Left kidney mass    Neuropathy    Obesity    Osteomyelitis, chronic, ankle or foot (HCC)    PONV (postoperative nausea and vomiting)    Renal insufficiency    Patient is on Dialysis and receives M,W and F.   S/P BKA (below knee amputation) ([place]) 08/2014   Right , Rondall Allegra, [place]   Sleep apnea    CPAP   Thyroid disease    Past Surgical History:  Procedure Laterality Date   BASCILIC VEIN TRANSPOSITION Left 02/17/2015   Procedure: BASCILIC VEIN TRANSPOSITION;  Surgeon: Katha Cabal, MD;  Location: [facility];  Service: Vascular;  Laterality: Left;   BELOW KNEE LEG AMPUTATION Right    CORONARY ANGIOPLASTY WITH STENT PLACEMENT     EYE SURGERY     FOOT SURGERY     Multiple R foot surgery for infection and charcot foot   I & D EXTREMITY Left 04/16/2016   Procedure: IRRIGATION AND DEBRIDEMENT EXTREMITY/GREAT TOE AMP.;  Surgeon: Edrick Kins, DPM;  Location: [place];  Service: Podiatry;  Laterality: Left;   JOINT REPLACEMENT     LAPAROSCOPIC NEPHRECTOMY, HAND ASSISTED Left 07/11/2015   Procedure: HAND ASSISTED LAPAROSCOPIC  NEPHRECTOMY;  Surgeon: Hollice Espy, MD;  Location: [facility];  Service: Urology;  Laterality: Left;   PERIPHERAL VASCULAR CATHETERIZATION Left 12/06/2014   Procedure: A/V Shuntogram/Fistulagram;  Surgeon: Katha Cabal, MD;  Location: [place] [facility];  Service: Cardiovascular;  Laterality: Left;   PERIPHERAL VASCULAR CATHETERIZATION Left 12/06/2014   Procedure: A/V Shunt Intervention;  Surgeon: Katha Cabal, MD;  Location: [place] [facility];  Service: Cardiovascular;  Laterality: Left;   TONSILLECTOMY     Allergies  Allergen Reactions   Ferric Citrate Nausea And Vomiting   No Known Allergies        Objective/Physical Exam General: The patient is alert and oriented x3 in no acute distress.  Dermatology:  Wound #1 noted to the distal tip of the left second toe measuring approximately 0.4 x 0.5 x 0.1 cm (LxWxD).  Please see above noted photo  To the noted ulceration(s), there is no eschar. There is a moderate amount of slough, fibrin, and necrotic tissue noted. Granulation tissue and wound base is red. There is a minimal amount of serosanguineous drainage noted. There is no exposed bone muscle-tendon ligament or joint. There is no malodor. Periwound integrity is intact. Skin is warm, dry and supple LLE  Vascular: Palpable pedal pulses left lower extremity.  No edema or erythema  Neurological: Epicritic and protective threshold diminished left  Musculoskeletal Exam: PSxHx BKA RLE, hallux amp LLE  Assessment: 1.  Ulcer left second toe secondary to diabetes mellitus 2. diabetes mellitus w/ peripheral neuropathy   Plan of Care:  1. Patient was evaluated. 2. medically necessary excisional debridement including subcutaneous tissue was performed using a tissue nipper and a chisel blade. Excisional debridement of all the necrotic nonviable tissue down to healthy bleeding viable tissue was performed with post-debridement measurements same as pre-. 3. the wound was cleansed  and dry sterile dressing applied. 4.  Patient has gentamicin cream at home with a standing refill.  Recommend applying gentamicin cream and a light Band-Aid daily 5.  Return to clinic in 3-4 weeks   Edrick Kins, DPM [facility]  Dr. Edrick Kins, DPM    [address],  [address]                Office [phone]  Fax [phone]

## 2021-08-30 LAB — TACROLIMUS LEVEL: Tacrolimus (FK506) - LabCorp: 7.3 ng/mL (ref 2.0–20.0)

## 2021-09-02 DIAGNOSIS — Z940 Kidney transplant status: Principal | ICD-10-CM

## 2021-09-03 DIAGNOSIS — R197 Diarrhea, unspecified: Secondary | ICD-10-CM | POA: Diagnosis not present

## 2021-09-03 MED FILL — AMLODIPINE 10 MG TABLET: ORAL | 30 days supply | Qty: 30 | Fill #5

## 2021-09-03 MED FILL — ASPIRIN 81 MG TABLET,DELAYED RELEASE: ORAL | 30 days supply | Qty: 30 | Fill #5

## 2021-09-05 ENCOUNTER — Ambulatory Visit: Payer: Medicare Other | Admitting: Podiatry

## 2021-09-10 ENCOUNTER — Other Ambulatory Visit (HOSPITAL_COMMUNITY)
Admission: RE | Admit: 2021-09-10 | Discharge: 2021-09-10 | Disposition: A | Discharge status: Home / Self Care | Payer: Medicare Other | Source: Ambulatory Visit | Attending: Pediatric Nephrology | Admitting: Pediatric Nephrology

## 2021-09-10 DIAGNOSIS — Z940 Kidney transplant status: Secondary | ICD-10-CM | POA: Diagnosis not present

## 2021-09-10 DIAGNOSIS — D899 Disorder involving the immune mechanism, unspecified: Secondary | ICD-10-CM | POA: Diagnosis present

## 2021-09-10 LAB — CBC WITH DIFFERENTIAL/PLATELET
Abs Immature Granulocytes: 0.38 10*3/uL — ABNORMAL HIGH (ref 0.00–0.07)
Basophils Absolute: 0 10*3/uL (ref 0.0–0.1)
Basophils Relative: 0 %
Eosinophils Absolute: 0.1 10*3/uL (ref 0.0–0.5)
Eosinophils Relative: 2 %
HCT: 34.9 % — ABNORMAL LOW (ref 39.0–52.0)
Hemoglobin: 10.4 g/dL — ABNORMAL LOW (ref 13.0–17.0)
Immature Granulocytes: 8 %
Lymphocytes Relative: 11 %
Lymphs Abs: 0.6 10*3/uL — ABNORMAL LOW (ref 0.7–4.0)
MCH: 25.4 pg — ABNORMAL LOW (ref 26.0–34.0)
MCHC: 29.8 g/dL — ABNORMAL LOW (ref 30.0–36.0)
MCV: 85.1 fL (ref 80.0–100.0)
Monocytes Absolute: 0.6 10*3/uL (ref 0.1–1.0)
Monocytes Relative: 11 %
Neutro Abs: 3.2 10*3/uL (ref 1.7–7.7)
Neutrophils Relative %: 68 %
Platelets: 195 10*3/uL (ref 150–400)
RBC: 4.1 MIL/uL — ABNORMAL LOW (ref 4.22–5.81)
RDW: 13.6 % (ref 11.5–15.5)
WBC: 4.8 10*3/uL (ref 4.0–10.5)
nRBC: 0 % (ref 0.0–0.2)

## 2021-09-10 LAB — BASIC METABOLIC PANEL
Anion gap: 6 (ref 5–15)
BUN: 48 mg/dL — ABNORMAL HIGH (ref 6–20)
CO2: 21 mmol/L — ABNORMAL LOW (ref 22–32)
Calcium: 8.4 mg/dL — ABNORMAL LOW (ref 8.9–10.3)
Chloride: 112 mmol/L — ABNORMAL HIGH (ref 98–111)
Creatinine, Ser: 2.92 mg/dL — ABNORMAL HIGH (ref 0.61–1.24)
GFR, Estimated: 24 mL/min — ABNORMAL LOW (ref >60)
Glucose, Bld: 119 mg/dL — ABNORMAL HIGH (ref 70–99)
Potassium: 4.2 mmol/L (ref 3.5–5.1)
Sodium: 139 mmol/L (ref 135–145)

## 2021-09-10 LAB — MAGNESIUM: Magnesium: 1.8 mg/dL (ref 1.7–2.4)

## 2021-09-10 LAB — PHOSPHORUS: Phosphorus: 4.3 mg/dL (ref 2.5–4.6)

## 2021-09-12 LAB — TACROLIMUS LEVEL: Tacrolimus (FK506) - LabCorp: 10.7 ng/mL (ref 2.0–20.0)

## 2021-09-14 MED FILL — CARVEDILOL 12.5 MG TABLET: ORAL | 30 days supply | Qty: 180 | Fill #5

## 2021-09-14 MED FILL — MYCOPHENOLATE SODIUM 180 MG TABLET,DELAYED RELEASE: ORAL | 30 days supply | Qty: 180 | Fill #8

## 2021-09-18 ENCOUNTER — Encounter (INDEPENDENT_AMBULATORY_CARE_PROVIDER_SITE_OTHER): Payer: Self-pay | Admitting: Ophthalmology

## 2021-09-18 ENCOUNTER — Ambulatory Visit (INDEPENDENT_AMBULATORY_CARE_PROVIDER_SITE_OTHER): Payer: Medicare Other | Admitting: Ophthalmology

## 2021-09-18 DIAGNOSIS — E113491 Type 2 diabetes mellitus with severe nonproliferative diabetic retinopathy without macular edema, right eye: Secondary | ICD-10-CM | POA: Diagnosis not present

## 2021-09-18 DIAGNOSIS — E113492 Type 2 diabetes mellitus with severe nonproliferative diabetic retinopathy without macular edema, left eye: Secondary | ICD-10-CM

## 2021-09-18 DIAGNOSIS — H2513 Age-related nuclear cataract, bilateral: Secondary | ICD-10-CM | POA: Diagnosis not present

## 2021-09-18 NOTE — Assessment & Plan Note (Signed)
Moderate to severe NPDR stable over time

## 2021-09-18 NOTE — Assessment & Plan Note (Signed)
Moderate to severe NPDR stable at this time

## 2021-09-18 NOTE — Progress Notes (Signed)
09/18/2021     CHIEF COMPLAINT Patient presents for  Chief Complaint  Patient presents with   Diabetic Retinopathy without Macular Edema      HISTORY OF PRESENT ILLNESS: Tim Becker is a 56 y.o. male who presents to the clinic today for:   HPI   1 YR FU OU OCT FP. "Burning sensation in the left eye, usually in the left eye. It mostly happens late at night." Pt stated vision has been stable since last visit. Pt had a kidney transplant Sept. 2022. Pt denies floaters and FOL.   Last edited by Silvestre Moment on 09/18/2021  9:25 AM.      Referring physician: Leeroy Cha, MD [address] [place],  [place] [address]  HISTORICAL INFORMATION:   Selected notes from the MEDICAL RECORD NUMBER    Lab Results  Component Value Date   HGBA1C 8.6 (H) 04/16/2016     CURRENT MEDICATIONS: No current outpatient medications on file. (Ophthalmic Drugs)   No current facility-administered medications for this visit. (Ophthalmic Drugs)   Current Outpatient Medications (Other)  Medication Sig   Accu-Chek Softclix Lancets lancets Accu-Chek Softclix Lancets   amLODipine (NORVASC) 10 MG tablet Take 10 mg by mouth in the morning. 9am   aspirin EC 81 MG tablet Take 81 mg by mouth daily. Swallow whole.   atorvastatin (LIPITOR) 40 MG tablet Take 40 mg by mouth daily. 9pm.   carvedilol (COREG) 12.5 MG tablet Take 37.5 mg by mouth 2 (two) times daily with a meal. 9am , and 9pm   furosemide (LASIX) 20 MG tablet Take 60 mg by mouth in the morning. 9am.   Glucose Blood (ONETOUCH TEST VI) by In Vitro route.   hydrALAZINE (APRESOLINE) 25 MG tablet Take 75 mg by mouth 3 (three) times daily. 9am, 3pm, and 9pm   insulin glargine (LANTUS) 100 unit/mL SOPN Inject 35 Units into the skin daily before breakfast.   insulin lispro (HUMALOG) 100 UNIT/ML injection Inject 3-17 Units into the skin 2 (two) times daily after a meal.   levothyroxine (SYNTHROID) 75 MCG tablet Take 75 mcg by mouth  daily before breakfast. 7am   mycophenolate (MYFORTIC) 180 MG EC tablet Take 180 mg by mouth 6 (six) times daily. 3 tablets at 9am and 9pm   Semaglutide, 1 MG/DOSE, (OZEMPIC, 1 MG/DOSE,) 2 MG/1.5ML SOPN Inject 1 mg into the skin once a week. 9am   Specialty Vitamins Products (MG-PLUS PROTEIN PO) Take 133 mg by mouth in the morning and at bedtime. 9am and 9pm   tacrolimus ER (ENVARSUS XR) 1 MG TB24 Take 7 mg by mouth daily before breakfast.   No current facility-administered medications for this visit. (Other)      REVIEW OF SYSTEMS: ROS   Negative for: Constitutional, Gastrointestinal, Neurological, Skin, Genitourinary, Musculoskeletal, HENT, Endocrine, Cardiovascular, Eyes, Respiratory, Psychiatric, Allergic/Imm, Heme/Lymph Last edited by Silvestre Moment on 09/18/2021  9:24 AM.       ALLERGIES Allergies  Allergen Reactions   Ferric Citrate Nausea And Vomiting   No Known Allergies     PAST MEDICAL HISTORY Past Medical History:  Diagnosis Date   Anemia    Arthritis    Cancer ([place])    Renal Tumor   CKD (chronic kidney disease)    Coronary artery disease    Diabetes ([place])    DM (diabetes mellitus) with complications ([place])    ESRD (end stage renal disease) ([place])    Pre- dialysis   History of colonoscopy 2011  Hypertension    Kidney transplanted 2022   Left kidney mass    Neuropathy    Obesity    Osteomyelitis, chronic, ankle or foot (HCC)    PONV (postoperative nausea and vomiting)    Renal insufficiency    Patient is on Dialysis and receives M,W and F.   S/P BKA (below knee amputation) ([place]) 08/2014   Right , Rondall Allegra,    Sleep apnea    CPAP   Thyroid disease    Past Surgical History:  Procedure Laterality Date   BASCILIC VEIN TRANSPOSITION Left 02/17/2015   Procedure: BASCILIC VEIN TRANSPOSITION;  Surgeon: Katha Cabal, MD;  Location: [facility];  Service: Vascular;  Laterality: Left;   BELOW KNEE LEG AMPUTATION Right    CORONARY ANGIOPLASTY WITH STENT  PLACEMENT     EYE SURGERY     FOOT SURGERY     Multiple R foot surgery for infection and charcot foot   I & D EXTREMITY Left 04/16/2016   Procedure: IRRIGATION AND DEBRIDEMENT EXTREMITY/GREAT TOE AMP.;  Surgeon: Edrick Kins, DPM;  Location: [place];  Service: Podiatry;  Laterality: Left;   JOINT REPLACEMENT     LAPAROSCOPIC NEPHRECTOMY, HAND ASSISTED Left 07/11/2015   Procedure: HAND ASSISTED LAPAROSCOPIC NEPHRECTOMY;  Surgeon: Hollice Espy, MD;  Location: [facility];  Service: Urology;  Laterality: Left;   PERIPHERAL VASCULAR CATHETERIZATION Left 12/06/2014   Procedure: A/V Shuntogram/Fistulagram;  Surgeon: Katha Cabal, MD;  Location: [place] [facility];  Service: Cardiovascular;  Laterality: Left;   PERIPHERAL VASCULAR CATHETERIZATION Left 12/06/2014   Procedure: A/V Shunt Intervention;  Surgeon: Katha Cabal, MD;  Location: [place] [facility];  Service: Cardiovascular;  Laterality: Left;   TONSILLECTOMY      FAMILY HISTORY Family History  Problem Relation Age of Onset   Diabetes Mother    Kidney disease Mother    Breast cancer Mother     SOCIAL HISTORY Social History   Tobacco Use   Smoking status: Never   Smokeless tobacco: Never  Vaping Use   Vaping Use: Never used  Substance Use Topics   Alcohol use: No   Drug use: Never         OPHTHALMIC EXAM:  Base Eye Exam     Visual Acuity (ETDRS)       Right Left   Dist [place] 20/25 20/25         Tonometry (Tonopen, 9:29 AM)       Right Left   Pressure 14 16         Pupils       Pupils APD   Right PERRL None   Left PERRL None         Visual Fields       Left Right    Full Full         Extraocular Movement       Right Left    Full Full         Neuro/Psych     Oriented x3: Yes   Mood/Affect: Normal         Dilation     Both eyes: 1.0% Mydriacyl, 2.5% Phenylephrine @ 9:29 AM           Slit Lamp and Fundus Exam     External Exam       Right Left   External Normal  Normal         Slit Lamp Exam       Right Left  Lids/Lashes Normal Normal   Conjunctiva/Sclera White and quiet White and quiet   Cornea Clear Clear   Anterior Chamber Deep and quiet Deep and quiet   Iris Round and reactive Round and reactive   Lens 2+ Nuclear sclerosis 2+ Nuclear sclerosis   Anterior Vitreous Normal Normal         Fundus Exam       Right Left   Posterior Vitreous Posterior vitreous detachment Posterior vitreous detachment   Disc Normal Normal   C/D Ratio 0.45 0.45   Macula Normal Normal   Vessels NPDR- Moderate, with some local PRP along IT arcade NPDR- Moderate   Periphery Normal, pigment, lattice inferiorly, no breaks Normal            IMAGING AND PROCEDURES  Imaging and Procedures for 09/18/21  OCT, Retina - OU - Both Eyes       Right Eye Quality was good. Scan locations included subfoveal. Central Foveal Thickness: 221. Progression has been stable. Findings include normal foveal contour.   Left Eye Quality was good. Scan locations included subfoveal. Central Foveal Thickness: 204. Progression has been stable. Findings include normal foveal contour.      Color Fundus Photography Optos - OU - Both Eyes       Right Eye Progression has been stable. Disc findings include normal observations. Macula : normal observations. Periphery : normal observations.   Left Eye Progression has been stable. Disc findings include normal observations. Macula : normal observations. Periphery : normal observations.   Notes Moderate to severe NPDR stable no active maculopathy, OU              ASSESSMENT/PLAN:  Nuclear sclerotic cataract of both eyes Darkening of [place] OU, accounts for nighttime vision difficulties patient experiences and glare.  Nonurgent evaluation with general ophthalmology cataract surgery evaluation just to which the relationship is appropriate  Severe nonproliferative diabetic retinopathy of left eye (HCC) Moderate to  severe NPDR stable at this time  Severe nonproliferative diabetic retinopathy of right eye (HCC) Moderate to severe NPDR stable over time     ICD-10-CM   1. Severe nonproliferative diabetic retinopathy of right eye without macular edema associated with type 2 diabetes mellitus (HCC)  E11.3491 OCT, Retina - OU - Both Eyes    Color Fundus Photography Optos - OU - Both Eyes    2. Nuclear sclerotic cataract of both eyes  H25.13     3. Severe nonproliferative diabetic retinopathy of left eye without macular edema associated with type 2 diabetes mellitus ([place])  G25.4270       1.  Bilateral borderline moderate to severe NPDR.  We will continue to monitor.  No active maculopathy OU.  Condition likely stabilized with recent renal transplant as blood sugar is under control.  2.  OU with progressive NSC changes present however.  Needs general eye examination and establishment with a cataract surgeon for discussion a proper timing for surgical intervention should it be required based upon his symptoms  3.  Ophthalmic Meds Ordered this visit:  No orders of the defined types were placed in this encounter.      Return in about 9 months (around 06/19/2022) for DILATE OU, COLOR FP, OCT.  There are no Patient Instructions on file for this visit.   Explained the diagnoses, plan, and follow up with the patient and they expressed understanding.  Patient expressed understanding of the importance of proper follow up care.   Clent Demark Fidencio Duddy M.D. Diseases & Surgery of  the Retina and Vitreous Retina & Diabetic [place] 09/18/21     Abbreviations: M myopia (nearsighted); A astigmatism; H hyperopia (farsighted); P presbyopia; Mrx spectacle prescription;  CTL contact lenses; OD right eye; OS left eye; OU both eyes  XT exotropia; ET esotropia; PEK punctate epithelial keratitis; PEE punctate epithelial erosions; DES dry eye syndrome; MGD meibomian gland dysfunction; ATs artificial tears; PFAT's  preservative free artificial tears; [place] nuclear sclerotic cataract; PSC posterior subcapsular cataract; ERM epi-retinal membrane; PVD posterior vitreous detachment; RD retinal detachment; DM diabetes mellitus; DR diabetic retinopathy; NPDR non-proliferative diabetic retinopathy; PDR proliferative diabetic retinopathy; CSME clinically significant macular edema; DME diabetic macular edema; dbh dot blot hemorrhages; CWS cotton wool spot; POAG primary open angle glaucoma; C/D cup-to-disc ratio; HVF humphrey visual field; GVF goldmann visual field; OCT optical coherence tomography; IOP intraocular pressure; BRVO Branch retinal vein occlusion; CRVO central retinal vein occlusion; CRAO central retinal artery occlusion; BRAO branch retinal artery occlusion; RT retinal tear; SB scleral buckle; PPV pars plana vitrectomy; VH Vitreous hemorrhage; PRP panretinal laser photocoagulation; IVK intravitreal kenalog; VMT vitreomacular traction; MH Macular hole;  NVD neovascularization of the disc; NVE neovascularization elsewhere; AREDS age related eye disease study; ARMD age related macular degeneration; POAG primary open angle glaucoma; EBMD epithelial/anterior basement membrane dystrophy; ACIOL anterior chamber intraocular lens; IOL intraocular lens; PCIOL posterior chamber intraocular lens; Phaco/IOL phacoemulsification with intraocular lens placement; [place] photorefractive keratectomy; LASIK laser assisted in situ keratomileusis; HTN hypertension; DM diabetes mellitus; COPD chronic obstructive pulmonary disease

## 2021-09-18 NOTE — Assessment & Plan Note (Signed)
Darkening of [place] OU, accounts for nighttime vision difficulties patient experiences and glare.  Nonurgent evaluation with general ophthalmology cataract surgery evaluation just to which the relationship is appropriate

## 2021-09-19 ENCOUNTER — Ambulatory Visit (INDEPENDENT_AMBULATORY_CARE_PROVIDER_SITE_OTHER): Payer: Medicare Other | Admitting: Podiatry

## 2021-09-19 DIAGNOSIS — L97522 Non-pressure chronic ulcer of other part of left foot with fat layer exposed: Secondary | ICD-10-CM

## 2021-09-19 NOTE — Progress Notes (Signed)
Subjective:  56 y.o. male with PMHx of diabetes mellitus, ESRD, peripheral neuropathy past surgical history of BKA RLE, hallux amputation LLE presenting today for follow-up evaluation of an ulcer to the distal tip of the left second toe.  Patient states that he has been applying the antibiotic cream as instructed.  He believes the wound has healed.  No new complaints at this time   Past Medical History:  Diagnosis Date   Anemia    Arthritis    Cancer ([place])    Renal Tumor   CKD (chronic kidney disease)    Coronary artery disease    Diabetes ([place])    DM (diabetes mellitus) with complications ([place])    ESRD (end stage renal disease) ([place])    Pre- dialysis   History of colonoscopy 2011   Hypertension    Kidney transplanted 2022   Left kidney mass    Neuropathy    Obesity    Osteomyelitis, chronic, ankle or foot (HCC)    PONV (postoperative nausea and vomiting)    Renal insufficiency    Patient is on Dialysis and receives M,W and F.   S/P BKA (below knee amputation) ([place]) 08/2014   Right , [place], [place]   Sleep apnea    CPAP   Thyroid disease    Past Surgical History:  Procedure Laterality Date   BASCILIC VEIN TRANSPOSITION Left 02/17/2015   Procedure: BASCILIC VEIN TRANSPOSITION;  Surgeon: Katha Cabal, MD;  Location: [facility];  Service: Vascular;  Laterality: Left;   BELOW KNEE LEG AMPUTATION Right    CORONARY ANGIOPLASTY WITH STENT PLACEMENT     EYE SURGERY     FOOT SURGERY     Multiple R foot surgery for infection and charcot foot   I & D EXTREMITY Left 04/16/2016   Procedure: IRRIGATION AND DEBRIDEMENT EXTREMITY/GREAT TOE AMP.;  Surgeon: Edrick Kins, DPM;  Location: [facility];  Service: Podiatry;  Laterality: Left;   JOINT REPLACEMENT     LAPAROSCOPIC NEPHRECTOMY, HAND ASSISTED Left 07/11/2015   Procedure: HAND ASSISTED LAPAROSCOPIC NEPHRECTOMY;  Surgeon: Hollice Espy, MD;  Location: [facility];  Service: Urology;  Laterality: Left;   PERIPHERAL VASCULAR  CATHETERIZATION Left 12/06/2014   Procedure: A/V Shuntogram/Fistulagram;  Surgeon: Katha Cabal, MD;  Location: [facility];  Service: Cardiovascular;  Laterality: Left;   PERIPHERAL VASCULAR CATHETERIZATION Left 12/06/2014   Procedure: A/V Shunt Intervention;  Surgeon: Katha Cabal, MD;  Location: [facility];  Service: Cardiovascular;  Laterality: Left;   TONSILLECTOMY     Allergies  Allergen Reactions   Ferric Citrate Nausea And Vomiting   No Known Allergies     Objective/Physical Exam General: The patient is alert and oriented x3 in no acute distress.  Dermatology:  Wound noted to the distal tip of the left second toe has healed.  Complete reepithelialization has occurred.  No open wound noted today.  There are some hyperkeratotic dystrophic nails noted 2-5 left foot. Skin is warm, dry and supple LLE  Vascular: Palpable pedal pulses left lower extremity.  No edema or erythema  Neurological: Epicritic and protective threshold diminished left  Musculoskeletal Exam: PSxHx BKA RLE, hallux amp LLE  Assessment: 1.  Ulcer left second toe secondary to diabetes mellitus; healed 2. diabetes mellitus w/ peripheral neuropathy 3.  Onychomycosis of toenails left   Plan of Care:  1. Patient was evaluated. 2.  Left second toe wound has healed.  Complete reepithelialization has occurred. 3.  Continue diabetic  shoes and insoles 4.  Mechanical debridement of nails 2-5 left foot was performed today using a nail nipper without incident or bleeding  5.   Return to clinic as needed    Edrick Kins, DPM [facility]  Dr. Edrick Kins, DPM    [address], [place] [address]                Office [phone]  Fax [phone]

## 2021-09-20 DIAGNOSIS — H25813 Combined forms of age-related cataract, bilateral: Secondary | ICD-10-CM | POA: Diagnosis not present

## 2021-09-20 DIAGNOSIS — E103593 Type 1 diabetes mellitus with proliferative diabetic retinopathy without macular edema, bilateral: Secondary | ICD-10-CM | POA: Diagnosis not present

## 2021-09-24 ENCOUNTER — Other Ambulatory Visit (HOSPITAL_COMMUNITY)
Admission: RE | Admit: 2021-09-24 | Discharge: 2021-09-24 | Disposition: A | Discharge status: Home / Self Care | Payer: Medicare Other | Source: Ambulatory Visit | Attending: Pediatric Nephrology | Admitting: Pediatric Nephrology

## 2021-09-24 DIAGNOSIS — E559 Vitamin D deficiency, unspecified: Secondary | ICD-10-CM | POA: Diagnosis not present

## 2021-09-24 DIAGNOSIS — Z940 Kidney transplant status: Secondary | ICD-10-CM | POA: Insufficient documentation

## 2021-09-24 DIAGNOSIS — Z79899 Other long term (current) drug therapy: Secondary | ICD-10-CM | POA: Diagnosis not present

## 2021-09-24 DIAGNOSIS — N390 Urinary tract infection, site not specified: Secondary | ICD-10-CM | POA: Diagnosis not present

## 2021-09-24 DIAGNOSIS — D631 Anemia in chronic kidney disease: Secondary | ICD-10-CM | POA: Diagnosis not present

## 2021-09-24 DIAGNOSIS — E1129 Type 2 diabetes mellitus with other diabetic kidney complication: Secondary | ICD-10-CM | POA: Diagnosis not present

## 2021-09-24 DIAGNOSIS — Z789 Other specified health status: Secondary | ICD-10-CM | POA: Insufficient documentation

## 2021-09-24 DIAGNOSIS — B259 Cytomegaloviral disease, unspecified: Secondary | ICD-10-CM | POA: Diagnosis not present

## 2021-09-24 DIAGNOSIS — Z114 Encounter for screening for human immunodeficiency virus [HIV]: Secondary | ICD-10-CM | POA: Insufficient documentation

## 2021-09-24 DIAGNOSIS — Z9483 Pancreas transplant status: Secondary | ICD-10-CM | POA: Insufficient documentation

## 2021-09-24 DIAGNOSIS — D899 Disorder involving the immune mechanism, unspecified: Secondary | ICD-10-CM | POA: Insufficient documentation

## 2021-09-24 DIAGNOSIS — Z09 Encounter for follow-up examination after completed treatment for conditions other than malignant neoplasm: Secondary | ICD-10-CM | POA: Diagnosis not present

## 2021-09-24 DIAGNOSIS — T8610 Unspecified complication of kidney transplant: Secondary | ICD-10-CM | POA: Insufficient documentation

## 2021-09-24 DIAGNOSIS — Y849 Medical procedure, unspecified as the cause of abnormal reaction of the patient, or of later complication, without mention of misadventure at the time of the procedure: Secondary | ICD-10-CM | POA: Diagnosis not present

## 2021-09-24 LAB — CBC WITH DIFFERENTIAL/PLATELET
Abs Immature Granulocytes: 0.34 10*3/uL — ABNORMAL HIGH (ref 0.00–0.07)
Basophils Absolute: 0 10*3/uL (ref 0.0–0.1)
Basophils Relative: 1 %
Eosinophils Absolute: 0.1 10*3/uL (ref 0.0–0.5)
Eosinophils Relative: 2 %
HCT: 35.7 % — ABNORMAL LOW (ref 39.0–52.0)
Hemoglobin: 10.2 g/dL — ABNORMAL LOW (ref 13.0–17.0)
Immature Granulocytes: 7 %
Lymphocytes Relative: 10 %
Lymphs Abs: 0.5 10*3/uL — ABNORMAL LOW (ref 0.7–4.0)
MCH: 24.5 pg — ABNORMAL LOW (ref 26.0–34.0)
MCHC: 28.6 g/dL — ABNORMAL LOW (ref 30.0–36.0)
MCV: 85.6 fL (ref 80.0–100.0)
Monocytes Absolute: 0.7 10*3/uL (ref 0.1–1.0)
Monocytes Relative: 13 %
Neutro Abs: 3.3 10*3/uL (ref 1.7–7.7)
Neutrophils Relative %: 67 %
Platelets: 197 10*3/uL (ref 150–400)
RBC: 4.17 MIL/uL — ABNORMAL LOW (ref 4.22–5.81)
RDW: 13.7 % (ref 11.5–15.5)
WBC: 4.9 10*3/uL (ref 4.0–10.5)
nRBC: 0 % (ref 0.0–0.2)

## 2021-09-24 LAB — BASIC METABOLIC PANEL
Anion gap: 7 (ref 5–15)
BUN: 42 mg/dL — ABNORMAL HIGH (ref 6–20)
CO2: 24 mmol/L (ref 22–32)
Calcium: 9.1 mg/dL (ref 8.9–10.3)
Chloride: 112 mmol/L — ABNORMAL HIGH (ref 98–111)
Creatinine, Ser: 2.72 mg/dL — ABNORMAL HIGH (ref 0.61–1.24)
GFR, Estimated: 27 mL/min — ABNORMAL LOW (ref >60)
Glucose, Bld: 130 mg/dL — ABNORMAL HIGH (ref 70–99)
Potassium: 4.7 mmol/L (ref 3.5–5.1)
Sodium: 143 mmol/L (ref 135–145)

## 2021-09-24 LAB — URINALYSIS, COMPLETE (UACMP) WITH MICROSCOPIC
Bilirubin Urine: NEGATIVE
Glucose, UA: NEGATIVE mg/dL
Hgb urine dipstick: NEGATIVE
Ketones, ur: NEGATIVE mg/dL
Leukocytes,Ua: NEGATIVE
Nitrite: NEGATIVE
Protein, ur: NEGATIVE mg/dL
Specific Gravity, Urine: 1.015 (ref 1.005–1.030)
pH: 6 (ref 5.0–8.0)

## 2021-09-24 LAB — PHOSPHORUS: Phosphorus: 4.2 mg/dL (ref 2.5–4.6)

## 2021-09-24 LAB — MAGNESIUM: Magnesium: 1.8 mg/dL (ref 1.7–2.4)

## 2021-09-24 MED FILL — LANTUS SOLOSTAR U-100 INSULIN 100 UNIT/ML (3 ML) SUBCUTANEOUS PEN: SUBCUTANEOUS | 42 days supply | Qty: 15 | Fill #1

## 2021-09-24 MED FILL — CHLORTHALIDONE 25 MG TABLET: ORAL | 30 days supply | Qty: 60 | Fill #7

## 2021-09-25 LAB — URINE CULTURE: Culture: NO GROWTH

## 2021-09-25 LAB — TACROLIMUS LEVEL: Tacrolimus (FK506) - LabCorp: 6.7 ng/mL (ref 2.0–20.0)

## 2021-09-30 DIAGNOSIS — Z940 Kidney transplant status: Principal | ICD-10-CM

## 2021-10-01 DIAGNOSIS — R7989 Other specified abnormal findings of blood chemistry: Principal | ICD-10-CM

## 2021-10-01 DIAGNOSIS — R82998 Other abnormal findings in urine: Principal | ICD-10-CM

## 2021-10-01 DIAGNOSIS — Z940 Kidney transplant status: Principal | ICD-10-CM

## 2021-10-03 ENCOUNTER — Institutional Professional Consult (permissible substitution): Admit: 2021-10-03 | Discharge: 2021-10-03 | Payer: MEDICARE

## 2021-10-03 ENCOUNTER — Ambulatory Visit: Admit: 2021-10-03 | Discharge: 2021-10-03 | Payer: MEDICARE

## 2021-10-03 DIAGNOSIS — Z940 Kidney transplant status: Principal | ICD-10-CM

## 2021-10-03 DIAGNOSIS — Z79899 Other long term (current) drug therapy: Principal | ICD-10-CM

## 2021-10-03 DIAGNOSIS — Z9189 Other specified personal risk factors, not elsewhere classified: Principal | ICD-10-CM

## 2021-10-03 DIAGNOSIS — Z89511 Acquired absence of right leg below knee: Secondary | ICD-10-CM | POA: Diagnosis not present

## 2021-10-03 DIAGNOSIS — Z85528 Personal history of other malignant neoplasm of kidney: Secondary | ICD-10-CM | POA: Diagnosis not present

## 2021-10-03 DIAGNOSIS — T8619 Other complication of kidney transplant: Secondary | ICD-10-CM | POA: Diagnosis not present

## 2021-10-03 DIAGNOSIS — G4733 Obstructive sleep apnea (adult) (pediatric): Secondary | ICD-10-CM | POA: Diagnosis not present

## 2021-10-03 DIAGNOSIS — Z4822 Encounter for aftercare following kidney transplant: Secondary | ICD-10-CM | POA: Diagnosis not present

## 2021-10-03 DIAGNOSIS — Z905 Acquired absence of kidney: Secondary | ICD-10-CM | POA: Diagnosis not present

## 2021-10-03 DIAGNOSIS — D649 Anemia, unspecified: Secondary | ICD-10-CM | POA: Diagnosis not present

## 2021-10-03 DIAGNOSIS — Z792 Long term (current) use of antibiotics: Secondary | ICD-10-CM | POA: Diagnosis not present

## 2021-10-03 DIAGNOSIS — Z7984 Long term (current) use of oral hypoglycemic drugs: Secondary | ICD-10-CM | POA: Diagnosis not present

## 2021-10-03 DIAGNOSIS — I2510 Atherosclerotic heart disease of native coronary artery without angina pectoris: Secondary | ICD-10-CM | POA: Diagnosis not present

## 2021-10-03 DIAGNOSIS — E119 Type 2 diabetes mellitus without complications: Secondary | ICD-10-CM | POA: Diagnosis not present

## 2021-10-03 DIAGNOSIS — I701 Atherosclerosis of renal artery: Secondary | ICD-10-CM | POA: Diagnosis not present

## 2021-10-03 DIAGNOSIS — Z888 Allergy status to other drugs, medicaments and biological substances status: Secondary | ICD-10-CM | POA: Diagnosis not present

## 2021-10-03 DIAGNOSIS — Z794 Long term (current) use of insulin: Secondary | ICD-10-CM | POA: Diagnosis not present

## 2021-10-03 DIAGNOSIS — Z955 Presence of coronary angioplasty implant and graft: Secondary | ICD-10-CM | POA: Diagnosis not present

## 2021-10-03 DIAGNOSIS — I10 Essential (primary) hypertension: Secondary | ICD-10-CM | POA: Diagnosis not present

## 2021-10-03 DIAGNOSIS — N185 Chronic kidney disease, stage 5: Secondary | ICD-10-CM | POA: Diagnosis not present

## 2021-10-03 DIAGNOSIS — E1122 Type 2 diabetes mellitus with diabetic chronic kidney disease: Secondary | ICD-10-CM | POA: Diagnosis not present

## 2021-10-03 DIAGNOSIS — E079 Disorder of thyroid, unspecified: Secondary | ICD-10-CM | POA: Diagnosis not present

## 2021-10-03 LAB — COMPREHENSIVE METABOLIC PANEL
Albumin: 4.1 (ref 3.5–5.0)
Calcium: 9.2 (ref 8.7–10.7)
eGFR: 29

## 2021-10-03 LAB — HEPATIC FUNCTION PANEL
ALT: 7 U/L — AB (ref 10–40)
AST: 14 (ref 14–40)
Alkaline Phosphatase: 120 (ref 25–125)

## 2021-10-03 LAB — BASIC METABOLIC PANEL
BUN: 47 — AB (ref 4–21)
CO2: 24 — AB (ref 13–22)
Chloride: 111 — AB (ref 99–108)
Creatinine: 2.5 — AB (ref 0.6–1.3)
Glucose: 146
Potassium: 4.9 mEq/L (ref 3.5–5.1)
Sodium: 142 (ref 137–147)

## 2021-10-03 LAB — CBC AND DIFFERENTIAL
HCT: 34 — AB (ref 41–53)
Hemoglobin: 10.3 — AB (ref 13.5–17.5)
Neutrophils Absolute: 3.7
Platelets: 197 10*3/uL (ref 150–400)
WBC: 4.6

## 2021-10-03 LAB — CBC: RBC: 4.23 (ref 3.87–5.11)

## 2021-10-03 LAB — HEMOGLOBIN A1C: Hemoglobin A1C: 6.3

## 2021-10-03 MED ORDER — INSULIN GLARGINE (U-100) 100 UNIT/ML (3 ML) SUBCUTANEOUS PEN
Freq: Every day | SUBCUTANEOUS | 8 refills | 42 days | Status: CP
Start: 2021-10-03 — End: 2022-10-03

## 2021-10-03 MED ORDER — INSULIN LISPRO (U-100) 100 UNIT/ML SUBCUTANEOUS PEN
Freq: Three times a day (TID) | SUBCUTANEOUS | 8 refills | 42 days | Status: CP
Start: 2021-10-03 — End: 2022-10-03

## 2021-10-03 MED ORDER — FUROSEMIDE 80 MG TABLET
ORAL_TABLET | Freq: Two times a day (BID) | ORAL | 11 refills | 15 days | Status: CP | PRN
Start: 2021-10-03 — End: 2022-10-03

## 2021-10-03 MED ORDER — EMPAGLIFLOZIN 10 MG TABLET
ORAL_TABLET | Freq: Every day | ORAL | 3 refills | 90 days | Status: CP
Start: 2021-10-03 — End: ?
  Filled 2021-10-05: qty 90, 90d supply, fill #0

## 2021-10-11 ENCOUNTER — Encounter: Payer: Self-pay | Admitting: Family

## 2021-10-15 MED FILL — CARVEDILOL 12.5 MG TABLET: ORAL | 30 days supply | Qty: 180 | Fill #6

## 2021-10-15 MED FILL — AMLODIPINE 10 MG TABLET: ORAL | 30 days supply | Qty: 30 | Fill #6

## 2021-10-15 MED FILL — MYCOPHENOLATE SODIUM 180 MG TABLET,DELAYED RELEASE: ORAL | 30 days supply | Qty: 180 | Fill #9

## 2021-10-19 ENCOUNTER — Other Ambulatory Visit: Payer: Medicare Other

## 2021-10-19 DIAGNOSIS — E1140 Type 2 diabetes mellitus with diabetic neuropathy, unspecified: Secondary | ICD-10-CM

## 2021-10-19 DIAGNOSIS — Z794 Long term (current) use of insulin: Secondary | ICD-10-CM | POA: Diagnosis not present

## 2021-10-19 DIAGNOSIS — E039 Hypothyroidism, unspecified: Secondary | ICD-10-CM

## 2021-10-19 DIAGNOSIS — E785 Hyperlipidemia, unspecified: Secondary | ICD-10-CM | POA: Diagnosis not present

## 2021-10-20 LAB — LIPID PANEL
Cholesterol: 59 mg/dL (ref <200)
HDL: 25 mg/dL — ABNORMAL LOW (ref >=40)
LDL Cholesterol (Calc): 16 mg/dL (calc)
Non-HDL Cholesterol (Calc): 34 mg/dL (calc) (ref <130)
Total CHOL/HDL Ratio: 2.4 (calc) (ref <5.0)
Triglycerides: 99 mg/dL (ref <150)

## 2021-10-20 LAB — CBC WITH DIFFERENTIAL/PLATELET
Absolute Monocytes: 611 cells/uL (ref 200–950)
Basophils Absolute: 42 cells/uL (ref 0–200)
Basophils Relative: 0.9 %
Eosinophils Absolute: 80 cells/uL (ref 15–500)
Eosinophils Relative: 1.7 %
HCT: 33.4 % — ABNORMAL LOW (ref 38.5–50.0)
Hemoglobin: 9.9 g/dL — ABNORMAL LOW (ref 13.2–17.1)
Lymphs Abs: 432 cells/uL — ABNORMAL LOW (ref 850–3900)
MCH: 24.6 pg — ABNORMAL LOW (ref 27.0–33.0)
MCHC: 29.6 g/dL — ABNORMAL LOW (ref 32.0–36.0)
MCV: 82.9 fL (ref 80.0–100.0)
MPV: 10.9 fL (ref 7.5–12.5)
Monocytes Relative: 13 %
Neutro Abs: 3534 cells/uL (ref 1500–7800)
Neutrophils Relative %: 75.2 %
Platelets: 200 10*3/uL (ref 140–400)
RBC: 4.03 10*6/uL — ABNORMAL LOW (ref 4.20–5.80)
RDW: 13 % (ref 11.0–15.0)
Total Lymphocyte: 9.2 %
WBC: 4.7 10*3/uL (ref 3.8–10.8)

## 2021-10-20 LAB — HEMOGLOBIN A1C
Hgb A1c MFr Bld: 6.3 % of total Hgb — ABNORMAL HIGH (ref <5.7)
Mean Plasma Glucose: 134 mg/dL
eAG (mmol/L): 7.4 mmol/L

## 2021-10-20 LAB — TSH: TSH: 1.44 mIU/L (ref 0.40–4.50)

## 2021-10-24 ENCOUNTER — Encounter: Payer: Self-pay | Admitting: Family

## 2021-10-24 ENCOUNTER — Ambulatory Visit (INDEPENDENT_AMBULATORY_CARE_PROVIDER_SITE_OTHER): Payer: Medicare Other | Admitting: Family

## 2021-10-24 VITALS — BP 150/62 | HR 89 | Temp 97.2°F | Resp 16 | Ht 68.0 in | Wt 230.8 lb

## 2021-10-24 DIAGNOSIS — D631 Anemia in chronic kidney disease: Secondary | ICD-10-CM | POA: Diagnosis not present

## 2021-10-24 DIAGNOSIS — E1140 Type 2 diabetes mellitus with diabetic neuropathy, unspecified: Secondary | ICD-10-CM | POA: Diagnosis not present

## 2021-10-24 DIAGNOSIS — N189 Chronic kidney disease, unspecified: Secondary | ICD-10-CM | POA: Diagnosis not present

## 2021-10-24 DIAGNOSIS — Z794 Long term (current) use of insulin: Secondary | ICD-10-CM

## 2021-10-24 DIAGNOSIS — E785 Hyperlipidemia, unspecified: Secondary | ICD-10-CM

## 2021-10-24 DIAGNOSIS — E039 Hypothyroidism, unspecified: Secondary | ICD-10-CM | POA: Diagnosis not present

## 2021-10-24 MED ORDER — IRON (FERROUS SULFATE) 325 (65 FE) MG PO TABS: 325.0000 mg | ORAL_TABLET | Freq: Every day | ORAL | 3 refills | Status: DC

## 2021-10-24 MED FILL — CHLORTHALIDONE 25 MG TABLET: ORAL | 30 days supply | Qty: 60 | Fill #8

## 2021-10-24 NOTE — Progress Notes (Signed)
Provider: Marlowe Sax FNP-C   Preethi Scantlebury, Nelda Bucks, NP  Patient Care Team: Barnie Sopko, Nelda Bucks, NP as PCP - General (Family Medicine) Buford Dresser, MD as PCP - Cardiology (Cardiology) Newt Minion, MD as Consulting Physician (Orthopedic Surgery) Edrick Kins, DPM as Consulting Physician (Podiatry)  Extended Emergency Contact Information Primary Emergency Contact: [name],[name] Address: [address], [place] [address] Johnnette Litter of [place] Phone: [phone] Mobile Phone: [phone] Relation: Spouse  Code Status:  Full Code  Goals of care: Advanced Directive information    10/24/2021   10:01 AM  Advanced Directives  Does Patient Have a Medical Advance Directive? No  Would patient like information on creating a medical advance directive? No - Patient declined     Chief Complaint  Patient presents with   Medical Management of Chronic Issues    4 month follow up.   Labs     Discuss recent labs.   Health Maintenance    Discuss the need for Foot exam, Eye exam, and Colonoscopy.    Immunizations    Discuss the need for Shingrix vaccine.    HPI:  Pt is a 56 y.o. male seen today for 4 months medical management of chronic diseases.    Hgb down to 9.9 previous 10.3   Hgb A 1 C 6.3 stable compared to previous  Has appointment [facility] 12/2021 was referred to [facility] for cataract surgery.He will schedule appointment.No change in vision.  Also follows up with podiatrist 2 months ago.  Had loose stool so stopped fiber mix and started drinking lemon and ginger mix which has helped. Eats breakfast around 10 Am and dinner between 4-6 pm and snacks between with cookies and candy every now and then.Not eating much as he used to when younger.  Takes Furosemide if weight > 230 lbs.He does not take it too often.No shortness of breath.  Has appointment for colonoscopy on 11/13/2021   Has not had a shingles vaccine.states never  had shingles.   He complains of bilateral lower abdominal pain and urine frequency. U/A and C/S done 10/03/2021 by another provider was negative for UTI.decline recheck today.      Past Medical History:  Diagnosis Date   Anemia    Arthritis    Cancer ([place])    Renal Tumor   CKD (chronic kidney disease)    Coronary artery disease    Diabetes ([place])    DM (diabetes mellitus) with complications ([place])    ESRD (end stage renal disease) ([place])    Pre- dialysis   History of colonoscopy 2011   Hypertension    Kidney transplanted 2022   Left kidney mass    Neuropathy    Obesity    Osteomyelitis, chronic, ankle or foot (HCC)    PONV (postoperative nausea and vomiting)    Renal insufficiency    Patient is on Dialysis and receives M,W and F.   S/P BKA (below knee amputation) ([place]) 08/2014   Right , [facility], [place]   Sleep apnea    CPAP   Thyroid disease    Past Surgical History:  Procedure Laterality Date   BASCILIC VEIN TRANSPOSITION Left 02/17/2015   Procedure: BASCILIC VEIN TRANSPOSITION;  Surgeon: Katha Cabal, MD;  Location: [facility];  Service: Vascular;  Laterality: Left;   BELOW KNEE LEG AMPUTATION Right    CORONARY ANGIOPLASTY WITH STENT PLACEMENT     EYE SURGERY  FOOT SURGERY     Multiple R foot surgery for infection and charcot foot   I & D EXTREMITY Left 04/16/2016   Procedure: IRRIGATION AND DEBRIDEMENT EXTREMITY/GREAT TOE AMP.;  Surgeon: Edrick Kins, DPM;  Location: [facility];  Service: Podiatry;  Laterality: Left;   JOINT REPLACEMENT     LAPAROSCOPIC NEPHRECTOMY, HAND ASSISTED Left 07/11/2015   Procedure: HAND ASSISTED LAPAROSCOPIC NEPHRECTOMY;  Surgeon: Hollice Espy, MD;  Location: [facility];  Service: Urology;  Laterality: Left;   PERIPHERAL VASCULAR CATHETERIZATION Left 12/06/2014   Procedure: A/V Shuntogram/Fistulagram;  Surgeon: Katha Cabal, MD;  Location: [facility];  Service: Cardiovascular;  Laterality: Left;   PERIPHERAL VASCULAR  CATHETERIZATION Left 12/06/2014   Procedure: A/V Shunt Intervention;  Surgeon: Katha Cabal, MD;  Location: [facility];  Service: Cardiovascular;  Laterality: Left;   TONSILLECTOMY      Allergies  Allergen Reactions   Ferric Citrate Nausea And Vomiting   No Known Allergies     Allergies as of 10/24/2021       Reactions   Ferric Citrate Nausea And Vomiting   No Known Allergies         Medication List        Accurate as of October 24, 2021 10:16 AM. If you have any questions, ask your nurse or doctor.          STOP taking these medications    hydrALAZINE 25 MG tablet Commonly known as: APRESOLINE Stopped by: Sandrea Hughs, NP   MG-PLUS PROTEIN PO Stopped by: Sandrea Hughs, NP       TAKE these medications    Accu-Chek Softclix Lancets lancets Accu-Chek Softclix Lancets   acetaminophen 500 MG tablet Commonly known as: TYLENOL Take 500 mg by mouth every 6 (six) hours as needed for moderate pain or mild pain.   amLODipine 10 MG tablet Commonly known as: NORVASC Take 10 mg by mouth in the morning. 9am   aspirin EC 81 MG tablet Take 81 mg by mouth daily. Swallow whole.   atorvastatin 40 MG tablet Commonly known as: LIPITOR Take 40 mg by mouth daily. 9pm.   carvedilol 12.5 MG tablet Commonly known as: COREG Take 37.5 mg by mouth 2 (two) times daily with a meal. 9am , and 9pm   chlorthalidone 25 MG tablet Commonly known as: HYGROTON Take 25 mg by mouth daily.   famotidine 20 MG tablet Commonly known as: PEPCID Take 20 mg by mouth daily.   furosemide 80 MG tablet Commonly known as: LASIX Take 80 mg by mouth as needed for edema or fluid. What changed: Another medication with the same name was removed. Continue taking this medication, and follow the directions you see here. Changed by: Sandrea Hughs, NP   insulin glargine 100 unit/mL Sopn Commonly known as: LANTUS Inject 35 Units into the skin daily before breakfast.   insulin  lispro 100 UNIT/ML injection Commonly known as: HUMALOG Inject 3-17 Units into the skin 2 (two) times daily after a meal.   Jardiance 10 MG Tabs tablet Generic drug: empagliflozin Take 10 mg by mouth daily.   levothyroxine 75 MCG tablet Commonly known as: SYNTHROID Take 75 mcg by mouth daily before breakfast. 7am   mycophenolate 180 MG EC tablet Commonly known as: MYFORTIC Take 180 mg by mouth in the morning and at bedtime. 3 tablets at 9am and 9pm   ONETOUCH TEST VI by In Vitro route.   Ozempic (1 MG/DOSE) 2 MG/1.5ML  Sopn Generic drug: Semaglutide (1 MG/DOSE) Inject 1 mg into the skin once a week. 9am   tacrolimus ER 1 MG Tb24 Commonly known as: ENVARSUS XR Take 7 mg by mouth daily before breakfast.        Review of Systems  Constitutional:  Negative for appetite change, chills, fatigue, fever and unexpected weight change.  HENT:  Negative for congestion, dental problem, ear discharge, ear pain, facial swelling, hearing loss, nosebleeds, postnasal drip, rhinorrhea, sinus pressure, sinus pain, sneezing, sore throat, tinnitus and trouble swallowing.   Eyes:  Positive for visual disturbance. Negative for pain, discharge, redness and itching.       Does not drive at night.Plans to schedule appointment for cataract surgery   Respiratory:  Negative for cough, chest tightness, shortness of breath and wheezing.   Cardiovascular:  Negative for chest pain, palpitations and leg swelling.  Gastrointestinal:  Negative for abdominal distention, abdominal pain, blood in stool, constipation, diarrhea, nausea and vomiting.  Endocrine: Negative for cold intolerance, heat intolerance, polydipsia, polyphagia and polyuria.  Genitourinary:  Negative for difficulty urinating, dysuria, flank pain, frequency and urgency.  Musculoskeletal:  Negative for arthralgias, back pain, gait problem, joint swelling, myalgias, neck pain and neck stiffness.  Skin:  Negative for color change, pallor, rash and  wound.  Neurological:  Negative for dizziness, syncope, speech difficulty, weakness, light-headedness, numbness and headaches.  Hematological:  Does not bruise/bleed easily.  Psychiatric/Behavioral:  Negative for agitation, behavioral problems, confusion, hallucinations, self-injury, sleep disturbance and suicidal ideas. The patient is not nervous/anxious.     Immunization History  Administered Date(s) Administered   Hepatitis B, adult 12/30/2014, 01/27/2015, 03/01/2015, 06/30/2015   Influenza Split 07/31/2011, 01/31/2020   Influenza,inj,Quad PF,6+ Mos 01/19/2020, 12/27/2020, 04/26/2021   Influenza-Unspecified 01/19/2020   PFIZER(Purple Top)SARS-COV-2 Vaccination 06/11/2019, 07/07/2019, 12/31/2019, 01/31/2020, 08/31/2020, 10/11/2020   Pfizer Covid-19 Vaccine Bivalent Booster 85yr & up 04/26/2021   Pneumococcal Polysaccharide-23 05/22/2015   Tdap 03/01/2017   Pertinent  Health Maintenance Due  Topic Date Due   FOOT EXAM  Never done   OPHTHALMOLOGY EXAM  Never done   COLONOSCOPY (Pts 45-467yrInsurance coverage will need to be confirmed)  01/06/2020   INFLUENZA VACCINE  10/30/2021   HEMOGLOBIN A1C  04/21/2022      01/29/2020    9:49 AM 06/21/2021    1:53 PM 10/24/2021   10:01 AM  Fall Risk  Falls in the past year?  0 0  Was there an injury with Fall?  0 0  Fall Risk Category Calculator  0 0  Fall Risk Category  Low Low  Patient Fall Risk Level Low fall risk Low fall risk Low fall risk  Patient at Risk for Falls Due to  No Fall Risks No Fall Risks  Fall risk Follow up  Falls evaluation completed Falls evaluation completed   Functional Status Survey:    Vitals:   10/24/21 0947  BP: (!) 150/62  Pulse: 89  Resp: 16  Temp: (!) 97.2 F (36.2 C)  SpO2: 97%  Weight: 230 lb 12.8 oz (104.7 kg)  Height: _0  (1.727 m)   Body mass index is 35.09 kg/m. Physical Exam Vitals reviewed.  Constitutional:      General: He is not in acute distress.    Appearance: Normal  appearance. He is normal weight. He is not ill-appearing or diaphoretic.  HENT:     Head: Normocephalic.     Right Ear: Tympanic membrane, ear canal and external ear normal. There is no impacted cerumen.  Left Ear: Tympanic membrane, ear canal and external ear normal. There is no impacted cerumen.     Nose: Nose normal. No congestion or rhinorrhea.     Mouth/Throat:     Mouth: Mucous membranes are moist.     Pharynx: Oropharynx is clear. No oropharyngeal exudate or posterior oropharyngeal erythema.  Eyes:     General: No scleral icterus.       Right eye: No discharge.        Left eye: No discharge.     Extraocular Movements: Extraocular movements intact.     Conjunctiva/sclera: Conjunctivae normal.     Pupils: Pupils are equal, round, and reactive to light.  Neck:     Vascular: No carotid bruit.  Cardiovascular:     Rate and Rhythm: Normal rate and regular rhythm.     Pulses: Normal pulses.     Heart sounds: Normal heart sounds. No murmur heard.    No friction rub. No gallop.  Pulmonary:     Effort: Pulmonary effort is normal. No respiratory distress.     Breath sounds: Normal breath sounds. No wheezing, rhonchi or rales.  Chest:     Chest wall: No tenderness.  Abdominal:     General: Bowel sounds are normal. There is no distension.     Palpations: Abdomen is soft. There is no mass.     Tenderness: There is no abdominal tenderness. There is no right CVA tenderness, left CVA tenderness, guarding or rebound.  Musculoskeletal:        General: No swelling or tenderness. Normal range of motion.     Cervical back: Normal range of motion. No rigidity or tenderness.     Right lower leg: No edema.     Left lower leg: No edema.  Lymphadenopathy:     Cervical: No cervical adenopathy.  Skin:    General: Skin is warm and dry.     Coloration: Skin is not pale.     Findings: No bruising, erythema, lesion or rash.  Neurological:     Mental Status: He is alert and oriented to person,  place, and time.     Cranial Nerves: No cranial nerve deficit.     Sensory: No sensory deficit.     Motor: No weakness.     Coordination: Coordination normal.     Gait: Gait normal.  Psychiatric:        Mood and Affect: Mood normal.        Speech: Speech normal.        Behavior: Behavior normal.        Thought Content: Thought content normal.        Judgment: Judgment normal.     Labs reviewed: Recent Labs    08/29/21 1215 09/10/21 0840 09/24/21 0634 10/03/21 0000  NA 141 139 143 142  K 5.0 4.2 4.7 4.9  CL 113* 112* 112* 111*  CO2 22 21* 24 24*  GLUCOSE 132* 119* 130*  --   BUN 40* 48* 42* 47*  CREATININE 2.45* 2.92* 2.72* 2.5*  CALCIUM 9.1 8.4* 9.1 9.2  MG 1.7 1.8 1.8  --   PHOS 3.6 4.3 4.2  --    Recent Labs    10/03/21 0000  AST 14  ALT 7*  ALKPHOS 120  ALBUMIN 4.1   Recent Labs    09/10/21 0840 09/24/21 0634 10/03/21 0000 10/19/21 0817  WBC 4.8 4.9 4.6 4.7  NEUTROABS 3.2 3.3 3.70 3,534  HGB 10.4* 10.2* 10.3* 9.9*  HCT 34.9*  35.7* 34* 33.4*  MCV 85.1 85.6  --  82.9  PLT 195 197 197 200   Lab Results  Component Value Date   TSH 1.44 10/19/2021   Lab Results  Component Value Date   HGBA1C 6.3 (H) 10/19/2021   Lab Results  Component Value Date   CHOL 59 10/19/2021   HDL 25 (L) 10/19/2021   LDLCALC 16 10/19/2021   TRIG 99 10/19/2021   CHOLHDL 2.4 10/19/2021    Significant Diagnostic Results in last 30 days:  No results found.  Assessment/Plan  1. Type 2 diabetes mellitus with diabetic neuropathy, with long-term current use of insulin (HCC) Lab Results  Component Value Date   HGBA1C 6.3 (H) 10/19/2021  - continue on Humalog,Lantus and Jardiance  - TSH; Future - CBC with Differential/Platelet; Future - Hemoglobin A1c; Future  2. Hyperlipidemia LDL goal <100 LDL at goal  - Lipid panel; Future  3. Acquired hypothyroidism Lab Results  Component Value Date   TSH 1.44 10/19/2021  - continue on levothyroxine   4. Anemia associated  with chronic renal failure Continue on iron supplement  - Iron, Ferrous Sulfate, 325 (65 Fe) MG TABS; Take 325 mg by mouth daily.  Dispense: 30 tablet; Refill: 3  Family/ staff Communication: Reviewed plan of care with patient verbalized understanding  Labs/tests ordered:  - CBC with Differential/Platelet - CMP with eGFR([facility]) - TSH - Hgb A1C - Lipid panel  Next Appointment : Return in about 6 months (around 04/26/2022) for medical mangement of chronic issues.Sandrea Hughs, NP

## 2021-10-30 MED FILL — DOCUSATE SODIUM 100 MG CAPSULE: ORAL | 30 days supply | Qty: 60 | Fill #1

## 2021-11-01 MED FILL — OZEMPIC 2 MG/DOSE (8 MG/3 ML) SUBCUTANEOUS PEN INJECTOR: SUBCUTANEOUS | 28 days supply | Qty: 3 | Fill #1

## 2021-11-05 ENCOUNTER — Other Ambulatory Visit (HOSPITAL_COMMUNITY)
Admission: RE | Admit: 2021-11-05 | Discharge: 2021-11-05 | Disposition: A | Discharge status: Home / Self Care | Payer: Medicare Other | Source: Ambulatory Visit | Attending: Pediatric Nephrology | Admitting: Pediatric Nephrology

## 2021-11-05 DIAGNOSIS — N390 Urinary tract infection, site not specified: Secondary | ICD-10-CM | POA: Insufficient documentation

## 2021-11-05 DIAGNOSIS — Z9483 Pancreas transplant status: Secondary | ICD-10-CM | POA: Insufficient documentation

## 2021-11-05 DIAGNOSIS — B259 Cytomegaloviral disease, unspecified: Secondary | ICD-10-CM | POA: Diagnosis not present

## 2021-11-05 DIAGNOSIS — E1129 Type 2 diabetes mellitus with other diabetic kidney complication: Secondary | ICD-10-CM | POA: Diagnosis not present

## 2021-11-05 DIAGNOSIS — N189 Chronic kidney disease, unspecified: Secondary | ICD-10-CM | POA: Diagnosis not present

## 2021-11-05 DIAGNOSIS — D631 Anemia in chronic kidney disease: Secondary | ICD-10-CM | POA: Diagnosis not present

## 2021-11-05 DIAGNOSIS — Z940 Kidney transplant status: Secondary | ICD-10-CM | POA: Diagnosis not present

## 2021-11-05 DIAGNOSIS — Z79899 Other long term (current) drug therapy: Secondary | ICD-10-CM | POA: Insufficient documentation

## 2021-11-05 DIAGNOSIS — D899 Disorder involving the immune mechanism, unspecified: Secondary | ICD-10-CM | POA: Diagnosis not present

## 2021-11-05 LAB — BASIC METABOLIC PANEL
Anion gap: 7 (ref 5–15)
BUN: 40 mg/dL — ABNORMAL HIGH (ref 6–20)
CO2: 21 mmol/L — ABNORMAL LOW (ref 22–32)
Calcium: 8.8 mg/dL — ABNORMAL LOW (ref 8.9–10.3)
Chloride: 112 mmol/L — ABNORMAL HIGH (ref 98–111)
Creatinine, Ser: 2.83 mg/dL — ABNORMAL HIGH (ref 0.61–1.24)
GFR, Estimated: 25 mL/min — ABNORMAL LOW (ref >60)
Glucose, Bld: 120 mg/dL — ABNORMAL HIGH (ref 70–99)
Potassium: 4.6 mmol/L (ref 3.5–5.1)
Sodium: 140 mmol/L (ref 135–145)

## 2021-11-05 LAB — CBC WITH DIFFERENTIAL/PLATELET
Abs Immature Granulocytes: 0.18 10*3/uL — ABNORMAL HIGH (ref 0.00–0.07)
Basophils Absolute: 0 10*3/uL (ref 0.0–0.1)
Basophils Relative: 1 %
Eosinophils Absolute: 0.1 10*3/uL (ref 0.0–0.5)
Eosinophils Relative: 2 %
HCT: 36.6 % — ABNORMAL LOW (ref 39.0–52.0)
Hemoglobin: 10.5 g/dL — ABNORMAL LOW (ref 13.0–17.0)
Immature Granulocytes: 4 %
Lymphocytes Relative: 8 %
Lymphs Abs: 0.4 10*3/uL — ABNORMAL LOW (ref 0.7–4.0)
MCH: 24.6 pg — ABNORMAL LOW (ref 26.0–34.0)
MCHC: 28.7 g/dL — ABNORMAL LOW (ref 30.0–36.0)
MCV: 85.7 fL (ref 80.0–100.0)
Monocytes Absolute: 0.6 10*3/uL (ref 0.1–1.0)
Monocytes Relative: 12 %
Neutro Abs: 3.8 10*3/uL (ref 1.7–7.7)
Neutrophils Relative %: 73 %
Platelets: 212 10*3/uL (ref 150–400)
RBC: 4.27 MIL/uL (ref 4.22–5.81)
RDW: 14.2 % (ref 11.5–15.5)
WBC: 5.2 10*3/uL (ref 4.0–10.5)
nRBC: 0 % (ref 0.0–0.2)

## 2021-11-05 LAB — PHOSPHORUS: Phosphorus: 3.5 mg/dL (ref 2.5–4.6)

## 2021-11-05 LAB — MAGNESIUM: Magnesium: 1.8 mg/dL (ref 1.7–2.4)

## 2021-11-06 MED FILL — ATORVASTATIN 40 MG TABLET: ORAL | 90 days supply | Qty: 90 | Fill #2

## 2021-11-07 MED FILL — AMLODIPINE 10 MG TABLET: ORAL | 30 days supply | Qty: 30 | Fill #7

## 2021-11-08 ENCOUNTER — Telehealth: Payer: Self-pay

## 2021-11-08 ENCOUNTER — Other Ambulatory Visit: Payer: Self-pay

## 2021-11-08 MED ORDER — BD PEN NEEDLE NANO 2ND GEN 32G X 4 MM MISC: 2.0000 | 3 refills | Status: DC

## 2021-11-08 MED ORDER — GLUCOSE BLOOD VI STRP: ORAL_STRIP | 3 refills | Status: DC

## 2021-11-08 MED ORDER — BD PEN NEEDLE NANO 2ND GEN 32G X 4 MM MISC: 2.0000 | Freq: Four times a day (QID) | 3 refills | Status: DC

## 2021-11-08 MED ORDER — BD PEN NEEDLE NANO 2ND GEN 32G X 4 MM MISC: 1.0000 | 3 refills | Status: DC

## 2021-11-08 MED ORDER — ACCU-CHEK SOFTCLIX LANCETS MISC: 3 refills | Status: DC

## 2021-11-08 NOTE — Telephone Encounter (Signed)
Patient request refill

## 2021-11-12 ENCOUNTER — Other Ambulatory Visit: Payer: Self-pay | Admitting: *Deleted

## 2021-11-12 MED ORDER — BD PEN NEEDLE NANO 2ND GEN 32G X 4 MM MISC: 3 refills | Status: DC

## 2021-11-12 NOTE — Telephone Encounter (Signed)
Pharmacy called and stated that they received a Rx but it has to have a frequency for the insurance to cover Needs new Rx

## 2021-11-13 DIAGNOSIS — K648 Other hemorrhoids: Secondary | ICD-10-CM | POA: Diagnosis not present

## 2021-11-13 DIAGNOSIS — R197 Diarrhea, unspecified: Secondary | ICD-10-CM | POA: Diagnosis not present

## 2021-11-13 DIAGNOSIS — R194 Change in bowel habit: Secondary | ICD-10-CM | POA: Diagnosis not present

## 2021-11-13 DIAGNOSIS — K6389 Other specified diseases of intestine: Secondary | ICD-10-CM | POA: Diagnosis not present

## 2021-11-13 DIAGNOSIS — D122 Benign neoplasm of ascending colon: Secondary | ICD-10-CM | POA: Diagnosis not present

## 2021-11-13 DIAGNOSIS — K5289 Other specified noninfective gastroenteritis and colitis: Secondary | ICD-10-CM | POA: Diagnosis not present

## 2021-11-13 MED FILL — MYCOPHENOLATE SODIUM 180 MG TABLET,DELAYED RELEASE: ORAL | 30 days supply | Qty: 180 | Fill #10

## 2021-11-13 MED FILL — CARVEDILOL 12.5 MG TABLET: ORAL | 30 days supply | Qty: 180 | Fill #7

## 2021-11-14 DIAGNOSIS — E1122 Type 2 diabetes mellitus with diabetic chronic kidney disease: Principal | ICD-10-CM

## 2021-11-14 DIAGNOSIS — Z794 Long term (current) use of insulin: Principal | ICD-10-CM

## 2021-11-14 DIAGNOSIS — N185 Chronic kidney disease, stage 5: Principal | ICD-10-CM

## 2021-11-14 MED ORDER — INSULIN GLARGINE (U-100) 100 UNIT/ML (3 ML) SUBCUTANEOUS PEN
Freq: Every day | SUBCUTANEOUS | 8 refills | 42 days | Status: CP
Start: 2021-11-14 — End: 2022-11-14
  Filled 2021-11-22: qty 15, 42d supply, fill #0

## 2021-11-14 MED ORDER — SEMAGLUTIDE 1 MG/DOSE (4 MG/3 ML) SUBCUTANEOUS PEN INJECTOR
SUBCUTANEOUS | 11 refills | 28 days | Status: CP
Start: 2021-11-14 — End: ?
  Filled 2021-11-19: qty 3, 28d supply, fill #0

## 2021-11-16 DIAGNOSIS — K6389 Other specified diseases of intestine: Secondary | ICD-10-CM | POA: Diagnosis not present

## 2021-11-16 DIAGNOSIS — K5289 Other specified noninfective gastroenteritis and colitis: Secondary | ICD-10-CM | POA: Diagnosis not present

## 2021-11-20 MED FILL — CHLORTHALIDONE 25 MG TABLET: ORAL | 30 days supply | Qty: 60 | Fill #9

## 2021-11-29 DIAGNOSIS — Z940 Kidney transplant status: Principal | ICD-10-CM

## 2021-11-29 MED ORDER — HYDRALAZINE 25 MG TABLET
ORAL_TABLET | Freq: Three times a day (TID) | ORAL | 11 refills | 30 days | Status: CP
Start: 2021-11-29 — End: 2022-11-29
  Filled 2021-12-10: qty 180, 30d supply, fill #0

## 2021-11-30 ENCOUNTER — Other Ambulatory Visit (HOSPITAL_COMMUNITY)
Admission: RE | Admit: 2021-11-30 | Discharge: 2021-11-30 | Disposition: A | Discharge status: Home / Self Care | Payer: Medicare Other | Source: Ambulatory Visit | Attending: Pediatric Nephrology | Admitting: Pediatric Nephrology

## 2021-11-30 DIAGNOSIS — Z79899 Other long term (current) drug therapy: Secondary | ICD-10-CM | POA: Diagnosis not present

## 2021-11-30 DIAGNOSIS — D899 Disorder involving the immune mechanism, unspecified: Secondary | ICD-10-CM | POA: Insufficient documentation

## 2021-11-30 LAB — CBC WITH DIFFERENTIAL/PLATELET
Abs Immature Granulocytes: 0.12 10*3/uL — ABNORMAL HIGH (ref 0.00–0.07)
Basophils Absolute: 0 10*3/uL (ref 0.0–0.1)
Basophils Relative: 0 %
Eosinophils Absolute: 0.1 10*3/uL (ref 0.0–0.5)
Eosinophils Relative: 1 %
HCT: 35.6 % — ABNORMAL LOW (ref 39.0–52.0)
Hemoglobin: 10 g/dL — ABNORMAL LOW (ref 13.0–17.0)
Immature Granulocytes: 2 %
Lymphocytes Relative: 7 %
Lymphs Abs: 0.6 10*3/uL — ABNORMAL LOW (ref 0.7–4.0)
MCH: 24.3 pg — ABNORMAL LOW (ref 26.0–34.0)
MCHC: 28.1 g/dL — ABNORMAL LOW (ref 30.0–36.0)
MCV: 86.4 fL (ref 80.0–100.0)
Monocytes Absolute: 0.7 10*3/uL (ref 0.1–1.0)
Monocytes Relative: 9 %
Neutro Abs: 6.3 10*3/uL (ref 1.7–7.7)
Neutrophils Relative %: 81 %
Platelets: 199 10*3/uL (ref 150–400)
RBC: 4.12 MIL/uL — ABNORMAL LOW (ref 4.22–5.81)
RDW: 14.8 % (ref 11.5–15.5)
WBC: 7.9 10*3/uL (ref 4.0–10.5)
nRBC: 0 % (ref 0.0–0.2)

## 2021-11-30 LAB — BASIC METABOLIC PANEL
Anion gap: 4 — ABNORMAL LOW (ref 5–15)
BUN: 44 mg/dL — ABNORMAL HIGH (ref 6–20)
CO2: 23 mmol/L (ref 22–32)
Calcium: 8.4 mg/dL — ABNORMAL LOW (ref 8.9–10.3)
Chloride: 112 mmol/L — ABNORMAL HIGH (ref 98–111)
Creatinine, Ser: 2.64 mg/dL — ABNORMAL HIGH (ref 0.61–1.24)
GFR, Estimated: 28 mL/min — ABNORMAL LOW (ref >60)
Glucose, Bld: 148 mg/dL — ABNORMAL HIGH (ref 70–99)
Potassium: 4.1 mmol/L (ref 3.5–5.1)
Sodium: 139 mmol/L (ref 135–145)

## 2021-11-30 LAB — URINALYSIS, ROUTINE W REFLEX MICROSCOPIC
Bilirubin Urine: NEGATIVE
Glucose, UA: >=500 mg/dL — AB
Hgb urine dipstick: NEGATIVE
Ketones, ur: NEGATIVE mg/dL
Leukocytes,Ua: NEGATIVE
Nitrite: NEGATIVE
Protein, ur: NEGATIVE mg/dL
Specific Gravity, Urine: 1.016 (ref 1.005–1.030)
pH: 5 (ref 5.0–8.0)

## 2021-11-30 LAB — PHOSPHORUS: Phosphorus: 3.1 mg/dL (ref 2.5–4.6)

## 2021-11-30 LAB — MAGNESIUM: Magnesium: 1.7 mg/dL (ref 1.7–2.4)

## 2021-12-01 LAB — URINE CULTURE: Culture: NO GROWTH

## 2021-12-02 LAB — TACROLIMUS LEVEL: Tacrolimus (FK506) - LabCorp: 7.8 ng/mL (ref 2.0–20.0)

## 2021-12-04 DIAGNOSIS — H25812 Combined forms of age-related cataract, left eye: Secondary | ICD-10-CM | POA: Diagnosis not present

## 2021-12-04 DIAGNOSIS — H2181 Floppy iris syndrome: Secondary | ICD-10-CM | POA: Diagnosis not present

## 2021-12-09 DIAGNOSIS — Z940 Kidney transplant status: Principal | ICD-10-CM

## 2021-12-10 MED FILL — CARVEDILOL 12.5 MG TABLET: ORAL | 30 days supply | Qty: 180 | Fill #8

## 2021-12-10 MED FILL — AMLODIPINE 10 MG TABLET: ORAL | 30 days supply | Qty: 30 | Fill #8

## 2021-12-10 MED FILL — MYCOPHENOLATE SODIUM 180 MG TABLET,DELAYED RELEASE: ORAL | 30 days supply | Qty: 180 | Fill #11

## 2021-12-10 MED FILL — OZEMPIC 1 MG/DOSE (4 MG/3 ML) SUBCUTANEOUS PEN INJECTOR: SUBCUTANEOUS | 28 days supply | Qty: 3 | Fill #1

## 2021-12-11 MED FILL — JARDIANCE 10 MG TABLET: ORAL | 90 days supply | Qty: 90 | Fill #1

## 2021-12-13 DIAGNOSIS — H2511 Age-related nuclear cataract, right eye: Secondary | ICD-10-CM | POA: Diagnosis not present

## 2021-12-18 DIAGNOSIS — H25811 Combined forms of age-related cataract, right eye: Secondary | ICD-10-CM | POA: Diagnosis not present

## 2021-12-25 ENCOUNTER — Ambulatory Visit: Admit: 2021-12-25 | Discharge: 2021-12-25 | Payer: MEDICARE

## 2021-12-25 DIAGNOSIS — N200 Calculus of kidney: Secondary | ICD-10-CM | POA: Diagnosis not present

## 2021-12-25 DIAGNOSIS — Z940 Kidney transplant status: Secondary | ICD-10-CM | POA: Diagnosis not present

## 2022-01-02 MED FILL — HYDRALAZINE 25 MG TABLET: ORAL | 30 days supply | Qty: 180 | Fill #1

## 2022-01-02 MED FILL — LANTUS SOLOSTAR U-100 INSULIN 100 UNIT/ML (3 ML) SUBCUTANEOUS PEN: SUBCUTANEOUS | 42 days supply | Qty: 15 | Fill #1

## 2022-01-02 MED FILL — OZEMPIC 1 MG/DOSE (4 MG/3 ML) SUBCUTANEOUS PEN INJECTOR: SUBCUTANEOUS | 28 days supply | Qty: 3 | Fill #2

## 2022-01-02 MED FILL — CHLORTHALIDONE 25 MG TABLET: ORAL | 30 days supply | Qty: 60 | Fill #10

## 2022-01-04 DIAGNOSIS — Z940 Kidney transplant status: Principal | ICD-10-CM

## 2022-01-04 MED ORDER — MYCOPHENOLATE SODIUM 180 MG TABLET,DELAYED RELEASE
ORAL_TABLET | Freq: Two times a day (BID) | ORAL | 11 refills | 30 days
Start: 2022-01-04 — End: 2023-01-04

## 2022-01-06 DIAGNOSIS — Z940 Kidney transplant status: Principal | ICD-10-CM

## 2022-01-07 MED ORDER — MYCOPHENOLATE SODIUM 180 MG TABLET,DELAYED RELEASE
ORAL_TABLET | Freq: Two times a day (BID) | ORAL | 11 refills | 30 days | Status: CP
Start: 2022-01-07 — End: 2023-01-07
  Filled 2022-01-08: qty 180, 30d supply, fill #0

## 2022-01-08 MED FILL — AMLODIPINE 10 MG TABLET: ORAL | 30 days supply | Qty: 30 | Fill #9

## 2022-01-08 MED FILL — CARVEDILOL 12.5 MG TABLET: ORAL | 30 days supply | Qty: 180 | Fill #9

## 2022-01-14 DIAGNOSIS — R8289 Other abnormal findings on cytological and histological examination of urine: Principal | ICD-10-CM

## 2022-01-14 DIAGNOSIS — R7309 Other abnormal glucose: Principal | ICD-10-CM

## 2022-01-14 DIAGNOSIS — Z940 Kidney transplant status: Principal | ICD-10-CM

## 2022-01-16 ENCOUNTER — Ambulatory Visit: Admit: 2022-01-16 | Discharge: 2022-01-16 | Payer: MEDICARE

## 2022-01-16 ENCOUNTER — Institutional Professional Consult (permissible substitution): Admit: 2022-01-16 | Discharge: 2022-01-16 | Payer: MEDICARE

## 2022-01-16 ENCOUNTER — Encounter: Admit: 2022-01-16 | Discharge: 2022-01-16 | Payer: MEDICARE

## 2022-01-16 DIAGNOSIS — I151 Hypertension secondary to other renal disorders: Principal | ICD-10-CM

## 2022-01-16 DIAGNOSIS — Z940 Kidney transplant status: Principal | ICD-10-CM

## 2022-01-16 DIAGNOSIS — Z79899 Other long term (current) drug therapy: Principal | ICD-10-CM

## 2022-01-16 DIAGNOSIS — D649 Anemia, unspecified: Principal | ICD-10-CM

## 2022-01-16 DIAGNOSIS — R7309 Other abnormal glucose: Principal | ICD-10-CM

## 2022-01-16 DIAGNOSIS — R8289 Other abnormal findings on cytological and histological examination of urine: Principal | ICD-10-CM

## 2022-01-16 DIAGNOSIS — N200 Calculus of kidney: Principal | ICD-10-CM

## 2022-01-16 DIAGNOSIS — Z7982 Long term (current) use of aspirin: Secondary | ICD-10-CM | POA: Diagnosis not present

## 2022-01-16 DIAGNOSIS — Z905 Acquired absence of kidney: Secondary | ICD-10-CM | POA: Diagnosis not present

## 2022-01-16 DIAGNOSIS — M792 Neuralgia and neuritis, unspecified: Secondary | ICD-10-CM | POA: Diagnosis not present

## 2022-01-16 DIAGNOSIS — Z794 Long term (current) use of insulin: Secondary | ICD-10-CM | POA: Diagnosis not present

## 2022-01-16 DIAGNOSIS — Z7985 Long-term (current) use of injectable non-insulin antidiabetic drugs: Secondary | ICD-10-CM | POA: Diagnosis not present

## 2022-01-16 DIAGNOSIS — E1142 Type 2 diabetes mellitus with diabetic polyneuropathy: Secondary | ICD-10-CM | POA: Diagnosis not present

## 2022-01-16 DIAGNOSIS — Z23 Encounter for immunization: Secondary | ICD-10-CM | POA: Diagnosis not present

## 2022-01-16 DIAGNOSIS — Z4822 Encounter for aftercare following kidney transplant: Secondary | ICD-10-CM | POA: Diagnosis not present

## 2022-01-16 DIAGNOSIS — E039 Hypothyroidism, unspecified: Secondary | ICD-10-CM | POA: Diagnosis not present

## 2022-01-16 DIAGNOSIS — I10 Essential (primary) hypertension: Secondary | ICD-10-CM | POA: Diagnosis not present

## 2022-01-16 MED ORDER — LEVOTHYROXINE 75 MCG TABLET
ORAL_TABLET | Freq: Every day | ORAL | 3 refills | 90 days | Status: CP
Start: 2022-01-16 — End: 2023-01-16
  Filled 2022-02-11: qty 90, 90d supply, fill #0

## 2022-01-16 MED ORDER — EMPAGLIFLOZIN 25 MG TABLET
ORAL_TABLET | Freq: Every day | ORAL | 3 refills | 90 days | Status: CP
Start: 2022-01-16 — End: 2023-01-16
  Filled 2022-02-11: qty 90, 90d supply, fill #0

## 2022-01-16 MED ORDER — ERGOCALCIFEROL (VITAMIN D2) 1,250 MCG (50,000 UNIT) CAPSULE
ORAL_CAPSULE | ORAL | 0 refills | 84 days | Status: CP
Start: 2022-01-16 — End: 2022-04-16
  Filled 2022-01-28: qty 12, 84d supply, fill #0

## 2022-01-16 MED ORDER — GABAPENTIN 100 MG CAPSULE
ORAL_CAPSULE | Freq: Two times a day (BID) | ORAL | 11 refills | 30 days | Status: CP
Start: 2022-01-16 — End: 2023-01-16

## 2022-01-16 MED ORDER — SPIRONOLACTONE 25 MG TABLET
ORAL_TABLET | Freq: Every day | ORAL | 11 refills | 30 days | Status: CP
Start: 2022-01-16 — End: 2023-01-16
  Filled 2022-01-28: qty 15, 30d supply, fill #0

## 2022-01-16 MED ORDER — HYDRALAZINE 25 MG TABLET
ORAL_TABLET | Freq: Two times a day (BID) | ORAL | 3 refills | 90 days | Status: CP
Start: 2022-01-16 — End: 2023-01-16
  Filled 2022-01-28: qty 540, 90d supply, fill #0

## 2022-01-17 MED ORDER — GABAPENTIN 100 MG CAPSULE
ORAL_CAPSULE | Freq: Two times a day (BID) | ORAL | 0 refills | 30.00000 days | Status: CP
Start: 2022-01-17 — End: 2022-02-16
  Filled 2022-02-11: qty 60, 30d supply, fill #0

## 2022-01-17 MED ORDER — COLCHICINE (GOUT) 0.6 MG TABLET
ORAL_TABLET | ORAL | 0 refills | 0.00000 days | Status: CP
Start: 2022-01-17 — End: 2022-01-17

## 2022-01-21 DIAGNOSIS — M109 Gout, unspecified: Principal | ICD-10-CM

## 2022-01-21 MED ORDER — ALLOPURINOL 100 MG TABLET
ORAL_TABLET | Freq: Every day | ORAL | 3 refills | 90 days | Status: CP
Start: 2022-01-21 — End: 2023-01-21

## 2022-01-22 DIAGNOSIS — R1031 Right lower quadrant pain: Secondary | ICD-10-CM | POA: Diagnosis not present

## 2022-01-22 DIAGNOSIS — R198 Other specified symptoms and signs involving the digestive system and abdomen: Secondary | ICD-10-CM | POA: Diagnosis not present

## 2022-01-22 DIAGNOSIS — K529 Noninfective gastroenteritis and colitis, unspecified: Secondary | ICD-10-CM | POA: Diagnosis not present

## 2022-01-28 MED FILL — ATORVASTATIN 40 MG TABLET: ORAL | 90 days supply | Qty: 90 | Fill #3

## 2022-01-28 MED FILL — CHLORTHALIDONE 25 MG TABLET: ORAL | 30 days supply | Qty: 60 | Fill #11

## 2022-02-01 MED FILL — AMLODIPINE 10 MG TABLET: ORAL | 30 days supply | Qty: 30 | Fill #10

## 2022-02-03 DIAGNOSIS — Z940 Kidney transplant status: Principal | ICD-10-CM

## 2022-02-06 ENCOUNTER — Telehealth: Payer: Self-pay

## 2022-02-06 ENCOUNTER — Other Ambulatory Visit: Payer: Self-pay

## 2022-02-06 MED ORDER — INSULIN GLARGINE 100 UNITS/ML SOLOSTAR PEN: 35.0000 [IU] | PEN_INJECTOR | Freq: Every day | SUBCUTANEOUS | 2 refills | Status: DC

## 2022-02-06 NOTE — Telephone Encounter (Signed)
Refill request received from [facility] for [place] '2mg'$ /31m. Med list says 1 mg.  Please send sorrect prescription to the pharmacy.  Message routed to DMarlowe Sax NP

## 2022-02-06 NOTE — Telephone Encounter (Signed)
Please very with patient if using 2 mg or still on 1 mg injection?

## 2022-02-07 ENCOUNTER — Other Ambulatory Visit (HOSPITAL_COMMUNITY)
Admission: RE | Admit: 2022-02-07 | Discharge: 2022-02-07 | Disposition: A | Discharge status: Home / Self Care | Payer: Medicare Other | Source: Ambulatory Visit | Attending: Pediatric Nephrology | Admitting: Pediatric Nephrology

## 2022-02-07 ENCOUNTER — Other Ambulatory Visit: Payer: Self-pay | Admitting: Family

## 2022-02-07 ENCOUNTER — Other Ambulatory Visit: Payer: Self-pay

## 2022-02-07 ENCOUNTER — Telehealth: Payer: Self-pay

## 2022-02-07 DIAGNOSIS — Z789 Other specified health status: Secondary | ICD-10-CM | POA: Diagnosis not present

## 2022-02-07 DIAGNOSIS — D631 Anemia in chronic kidney disease: Secondary | ICD-10-CM | POA: Insufficient documentation

## 2022-02-07 DIAGNOSIS — Z79899 Other long term (current) drug therapy: Secondary | ICD-10-CM | POA: Insufficient documentation

## 2022-02-07 DIAGNOSIS — Z9483 Pancreas transplant status: Secondary | ICD-10-CM | POA: Insufficient documentation

## 2022-02-07 DIAGNOSIS — E559 Vitamin D deficiency, unspecified: Secondary | ICD-10-CM | POA: Insufficient documentation

## 2022-02-07 DIAGNOSIS — B259 Cytomegaloviral disease, unspecified: Secondary | ICD-10-CM | POA: Insufficient documentation

## 2022-02-07 DIAGNOSIS — Z940 Kidney transplant status: Secondary | ICD-10-CM | POA: Insufficient documentation

## 2022-02-07 DIAGNOSIS — N390 Urinary tract infection, site not specified: Secondary | ICD-10-CM | POA: Diagnosis not present

## 2022-02-07 DIAGNOSIS — E1140 Type 2 diabetes mellitus with diabetic neuropathy, unspecified: Secondary | ICD-10-CM

## 2022-02-07 DIAGNOSIS — Z09 Encounter for follow-up examination after completed treatment for conditions other than malignant neoplasm: Secondary | ICD-10-CM | POA: Insufficient documentation

## 2022-02-07 DIAGNOSIS — E1129 Type 2 diabetes mellitus with other diabetic kidney complication: Secondary | ICD-10-CM | POA: Insufficient documentation

## 2022-02-07 DIAGNOSIS — D899 Disorder involving the immune mechanism, unspecified: Secondary | ICD-10-CM | POA: Insufficient documentation

## 2022-02-07 DIAGNOSIS — Z114 Encounter for screening for human immunodeficiency virus [HIV]: Secondary | ICD-10-CM | POA: Insufficient documentation

## 2022-02-07 LAB — CBC WITH DIFFERENTIAL/PLATELET
Abs Immature Granulocytes: 0.07 K/uL (ref 0.00–0.07)
Basophils Absolute: 0.1 K/uL (ref 0.0–0.1)
Basophils Relative: 1 %
Eosinophils Absolute: 0.1 K/uL (ref 0.0–0.5)
Eosinophils Relative: 1 %
HCT: 39.9 % (ref 39.0–52.0)
Hemoglobin: 11.7 g/dL — ABNORMAL LOW (ref 13.0–17.0)
Immature Granulocytes: 1 %
Lymphocytes Relative: 6 %
Lymphs Abs: 0.5 K/uL — ABNORMAL LOW (ref 0.7–4.0)
MCH: 24.9 pg — ABNORMAL LOW (ref 26.0–34.0)
MCHC: 29.3 g/dL — ABNORMAL LOW (ref 30.0–36.0)
MCV: 84.9 fL (ref 80.0–100.0)
Monocytes Absolute: 0.7 K/uL (ref 0.1–1.0)
Monocytes Relative: 9 %
Neutro Abs: 6.5 K/uL (ref 1.7–7.7)
Neutrophils Relative %: 82 %
Platelets: 200 K/uL (ref 150–400)
RBC: 4.7 MIL/uL (ref 4.22–5.81)
RDW: 14.2 % (ref 11.5–15.5)
WBC: 7.9 K/uL (ref 4.0–10.5)
nRBC: 0 % (ref 0.0–0.2)

## 2022-02-07 LAB — BASIC METABOLIC PANEL
Anion gap: 10 (ref 5–15)
BUN: 75 mg/dL — ABNORMAL HIGH (ref 6–20)
CO2: 25 mmol/L (ref 22–32)
Calcium: 9.2 mg/dL (ref 8.9–10.3)
Chloride: 106 mmol/L (ref 98–111)
Creatinine, Ser: 2.87 mg/dL — ABNORMAL HIGH (ref 0.61–1.24)
GFR, Estimated: 25 mL/min — ABNORMAL LOW (ref >60)
Glucose, Bld: 160 mg/dL — ABNORMAL HIGH (ref 70–99)
Potassium: 4.4 mmol/L (ref 3.5–5.1)
Sodium: 141 mmol/L (ref 135–145)

## 2022-02-07 LAB — MAGNESIUM: Magnesium: 2.1 mg/dL (ref 1.7–2.4)

## 2022-02-07 LAB — PHOSPHORUS: Phosphorus: 4.8 mg/dL — ABNORMAL HIGH (ref 2.5–4.6)

## 2022-02-07 MED ORDER — OZEMPIC (1 MG/DOSE) 2 MG/1.5ML ~~LOC~~ SOPN: 1.0000 mg | PEN_INJECTOR | SUBCUTANEOUS | 3 refills | Status: DC

## 2022-02-07 NOTE — Telephone Encounter (Signed)
Patient currently on Ozempic1 mg inject weekly

## 2022-02-07 NOTE — Telephone Encounter (Signed)
The pharmacy sent a handwritten note via fax questioning if Tim Becker would like to Ozempic '4mg'$ /45m dispensed versus what is currently on the medication list, as a result of the instructions: 1 mg into the skin daily   Please advise

## 2022-02-07 NOTE — Telephone Encounter (Signed)
To confirm you would like to leave order as is and not change to what the pharmacy is suggesting?

## 2022-02-07 NOTE — Telephone Encounter (Signed)
Will refill Ozempec 1 mg injection

## 2022-02-07 NOTE — Telephone Encounter (Signed)
Patient said that he takes '1mg'$ 

## 2022-02-07 NOTE — Progress Notes (Signed)
Ozempic refilled

## 2022-02-07 NOTE — Telephone Encounter (Signed)
Will need to evaluate if patient is tolerating current dose well prior to advancing dose.

## 2022-02-10 LAB — TACROLIMUS LEVEL: Tacrolimus (FK506) - LabCorp: 7.8 ng/mL (ref 2.0–20.0)

## 2022-02-11 MED FILL — MYCOPHENOLATE SODIUM 180 MG TABLET,DELAYED RELEASE: ORAL | 30 days supply | Qty: 180 | Fill #1

## 2022-02-11 MED FILL — CARVEDILOL 12.5 MG TABLET: ORAL | 30 days supply | Qty: 180 | Fill #10

## 2022-02-11 NOTE — Telephone Encounter (Signed)
Left detailed message on voicemail for pharmacy informing them that Tim Becker would like rx dispense as written and she does not wish to change the dose.

## 2022-02-12 ENCOUNTER — Institutional Professional Consult (permissible substitution): Admit: 2022-02-12 | Discharge: 2022-02-13 | Payer: MEDICARE

## 2022-02-12 MED ORDER — SPIRONOLACTONE 25 MG TABLET
ORAL_TABLET | Freq: Every day | ORAL | 3 refills | 90 days | Status: CP
Start: 2022-02-12 — End: 2023-02-12
  Filled 2022-02-19: qty 90, 90d supply, fill #0

## 2022-02-12 MED ORDER — ALLOPURINOL 100 MG TABLET
ORAL_TABLET | ORAL | 3 refills | 90 days | Status: CP
Start: 2022-02-12 — End: 2023-02-12
  Filled 2022-03-05: qty 15, 30d supply, fill #0

## 2022-02-23 MED ORDER — CHLORTHALIDONE 25 MG TABLET
ORAL_TABLET | Freq: Every morning | ORAL | 11 refills | 30 days
Start: 2022-02-23 — End: 2023-02-23

## 2022-02-26 MED ORDER — CHLORTHALIDONE 25 MG TABLET
ORAL_TABLET | Freq: Every morning | ORAL | 11 refills | 30 days
Start: 2022-02-26 — End: 2023-02-26

## 2022-03-01 MED ORDER — CHLORTHALIDONE 25 MG TABLET
ORAL_TABLET | Freq: Every morning | ORAL | 11 refills | 30 days | Status: CP
Start: 2022-03-01 — End: 2023-03-01
  Filled 2022-03-01: qty 60, 30d supply, fill #0

## 2022-03-05 MED FILL — AMLODIPINE 10 MG TABLET: ORAL | 30 days supply | Qty: 30 | Fill #11

## 2022-03-13 MED FILL — GABAPENTIN 100 MG CAPSULE: ORAL | 30 days supply | Qty: 60 | Fill #1

## 2022-03-13 MED FILL — CARVEDILOL 12.5 MG TABLET: ORAL | 30 days supply | Qty: 180 | Fill #11

## 2022-03-13 MED FILL — MYCOPHENOLATE SODIUM 180 MG TABLET,DELAYED RELEASE: ORAL | 30 days supply | Qty: 180 | Fill #2

## 2022-03-22 ENCOUNTER — Other Ambulatory Visit: Payer: Self-pay

## 2022-03-22 ENCOUNTER — Emergency Department (HOSPITAL_COMMUNITY): Payer: Medicare Other

## 2022-03-22 ENCOUNTER — Encounter (HOSPITAL_COMMUNITY): Payer: Self-pay

## 2022-03-22 ENCOUNTER — Inpatient Hospital Stay (HOSPITAL_COMMUNITY)
Admission: EM | Admit: 2022-03-22 | Discharge: 2022-03-25 | DRG: 194 | Disposition: A | Discharge status: Home / Self Care | Payer: Medicare Other | Attending: Internal Medicine | Admitting: Internal Medicine

## 2022-03-22 DIAGNOSIS — Z7985 Long-term (current) use of injectable non-insulin antidiabetic drugs: Secondary | ICD-10-CM | POA: Diagnosis not present

## 2022-03-22 DIAGNOSIS — Z79890 Hormone replacement therapy (postmenopausal): Secondary | ICD-10-CM

## 2022-03-22 DIAGNOSIS — R600 Localized edema: Secondary | ICD-10-CM | POA: Diagnosis present

## 2022-03-22 DIAGNOSIS — R509 Fever, unspecified: Secondary | ICD-10-CM | POA: Diagnosis not present

## 2022-03-22 DIAGNOSIS — N184 Chronic kidney disease, stage 4 (severe): Secondary | ICD-10-CM | POA: Diagnosis present

## 2022-03-22 DIAGNOSIS — N189 Chronic kidney disease, unspecified: Secondary | ICD-10-CM

## 2022-03-22 DIAGNOSIS — D84821 Immunodeficiency due to drugs: Secondary | ICD-10-CM | POA: Diagnosis not present

## 2022-03-22 DIAGNOSIS — E1140 Type 2 diabetes mellitus with diabetic neuropathy, unspecified: Secondary | ICD-10-CM | POA: Diagnosis not present

## 2022-03-22 DIAGNOSIS — E039 Hypothyroidism, unspecified: Secondary | ICD-10-CM | POA: Diagnosis not present

## 2022-03-22 DIAGNOSIS — I129 Hypertensive chronic kidney disease with stage 1 through stage 4 chronic kidney disease, or unspecified chronic kidney disease: Secondary | ICD-10-CM | POA: Diagnosis present

## 2022-03-22 DIAGNOSIS — J189 Pneumonia, unspecified organism: Secondary | ICD-10-CM | POA: Diagnosis not present

## 2022-03-22 DIAGNOSIS — B9729 Other coronavirus as the cause of diseases classified elsewhere: Secondary | ICD-10-CM | POA: Diagnosis not present

## 2022-03-22 DIAGNOSIS — E1122 Type 2 diabetes mellitus with diabetic chronic kidney disease: Secondary | ICD-10-CM | POA: Diagnosis not present

## 2022-03-22 DIAGNOSIS — D638 Anemia in other chronic diseases classified elsewhere: Secondary | ICD-10-CM | POA: Diagnosis present

## 2022-03-22 DIAGNOSIS — Z7982 Long term (current) use of aspirin: Secondary | ICD-10-CM

## 2022-03-22 DIAGNOSIS — M109 Gout, unspecified: Secondary | ICD-10-CM | POA: Diagnosis not present

## 2022-03-22 DIAGNOSIS — Z79899 Other long term (current) drug therapy: Secondary | ICD-10-CM | POA: Diagnosis not present

## 2022-03-22 DIAGNOSIS — Z7960 Long term (current) use of unspecified immunomodulators and immunosuppressants: Secondary | ICD-10-CM

## 2022-03-22 DIAGNOSIS — R0902 Hypoxemia: Secondary | ICD-10-CM | POA: Diagnosis not present

## 2022-03-22 DIAGNOSIS — Z89511 Acquired absence of right leg below knee: Secondary | ICD-10-CM

## 2022-03-22 DIAGNOSIS — T8619 Other complication of kidney transplant: Secondary | ICD-10-CM | POA: Diagnosis present

## 2022-03-22 DIAGNOSIS — Z955 Presence of coronary angioplasty implant and graft: Secondary | ICD-10-CM

## 2022-03-22 DIAGNOSIS — Z1152 Encounter for screening for COVID-19: Secondary | ICD-10-CM

## 2022-03-22 DIAGNOSIS — Z794 Long term (current) use of insulin: Secondary | ICD-10-CM

## 2022-03-22 DIAGNOSIS — N179 Acute kidney failure, unspecified: Secondary | ICD-10-CM | POA: Diagnosis not present

## 2022-03-22 DIAGNOSIS — Z7984 Long term (current) use of oral hypoglycemic drugs: Secondary | ICD-10-CM | POA: Diagnosis not present

## 2022-03-22 DIAGNOSIS — Z888 Allergy status to other drugs, medicaments and biological substances status: Secondary | ICD-10-CM

## 2022-03-22 DIAGNOSIS — R059 Cough, unspecified: Secondary | ICD-10-CM | POA: Diagnosis not present

## 2022-03-22 DIAGNOSIS — J159 Unspecified bacterial pneumonia: Principal | ICD-10-CM

## 2022-03-22 LAB — COMPREHENSIVE METABOLIC PANEL
ALT: 29 U/L (ref 0–44)
AST: 27 U/L (ref 15–41)
Albumin: 3.3 g/dL — ABNORMAL LOW (ref 3.5–5.0)
Alkaline Phosphatase: 67 U/L (ref 38–126)
Anion gap: 9 (ref 5–15)
BUN: 48 mg/dL — ABNORMAL HIGH (ref 6–20)
CO2: 26 mmol/L (ref 22–32)
Calcium: 9.1 mg/dL (ref 8.9–10.3)
Chloride: 100 mmol/L (ref 98–111)
Creatinine, Ser: 3.09 mg/dL — ABNORMAL HIGH (ref 0.61–1.24)
GFR, Estimated: 23 mL/min — ABNORMAL LOW (ref >60)
Glucose, Bld: 133 mg/dL — ABNORMAL HIGH (ref 70–99)
Potassium: 4.5 mmol/L (ref 3.5–5.1)
Sodium: 135 mmol/L (ref 135–145)
Total Bilirubin: 0.9 mg/dL (ref 0.3–1.2)
Total Protein: 6.6 g/dL (ref 6.5–8.1)

## 2022-03-22 LAB — CBC WITH DIFFERENTIAL/PLATELET
Abs Immature Granulocytes: 1.49 10*3/uL — ABNORMAL HIGH (ref 0.00–0.07)
Basophils Absolute: 0.1 10*3/uL (ref 0.0–0.1)
Basophils Relative: 0 %
Eosinophils Absolute: 0.1 10*3/uL (ref 0.0–0.5)
Eosinophils Relative: 0 %
HCT: 34.9 % — ABNORMAL LOW (ref 39.0–52.0)
Hemoglobin: 10.2 g/dL — ABNORMAL LOW (ref 13.0–17.0)
Immature Granulocytes: 6 %
Lymphocytes Relative: 2 %
Lymphs Abs: 0.5 10*3/uL — ABNORMAL LOW (ref 0.7–4.0)
MCH: 24.6 pg — ABNORMAL LOW (ref 26.0–34.0)
MCHC: 29.2 g/dL — ABNORMAL LOW (ref 30.0–36.0)
MCV: 84.1 fL (ref 80.0–100.0)
Monocytes Absolute: 1.7 10*3/uL — ABNORMAL HIGH (ref 0.1–1.0)
Monocytes Relative: 7 %
Neutro Abs: 19.6 10*3/uL — ABNORMAL HIGH (ref 1.7–7.7)
Neutrophils Relative %: 85 %
Platelets: 371 10*3/uL (ref 150–400)
RBC: 4.15 MIL/uL — ABNORMAL LOW (ref 4.22–5.81)
RDW: 13.2 % (ref 11.5–15.5)
WBC: 23.4 10*3/uL — ABNORMAL HIGH (ref 4.0–10.5)
nRBC: 0 % (ref 0.0–0.2)

## 2022-03-22 LAB — LACTIC ACID, PLASMA
Lactic Acid, Venous: 0.9 mmol/L (ref 0.5–1.9)
Lactic Acid, Venous: 0.7 mmol/L (ref 0.5–1.9)

## 2022-03-22 LAB — RESP PANEL BY RT-PCR (RSV, FLU A&B, COVID)  RVPGX2
Influenza A by PCR: NEGATIVE
Influenza B by PCR: NEGATIVE
Resp Syncytial Virus by PCR: NEGATIVE
SARS Coronavirus 2 by RT PCR: NEGATIVE

## 2022-03-22 LAB — GLUCOSE, CAPILLARY: Glucose-Capillary: 127 mg/dL — ABNORMAL HIGH (ref 70–99)

## 2022-03-22 LAB — PROCALCITONIN: Procalcitonin: 0.48 ng/mL

## 2022-03-22 MED ORDER — INSULIN ASPART 100 UNIT/ML IJ SOLN
0.0000 [IU] | Freq: Three times a day (TID) | INTRAMUSCULAR | Status: DC
  Administered 2022-03-23 – 2022-03-25 (×3): 2 [IU] via SUBCUTANEOUS

## 2022-03-22 MED ORDER — TACROLIMUS ER 4 MG PO TB24
6.0000 mg | ORAL_TABLET | Freq: Every day | ORAL | Status: DC
  Administered 2022-03-23 – 2022-03-25 (×3): 6 mg via ORAL
  Filled 2022-03-22 (×3): qty 2

## 2022-03-22 MED ORDER — SODIUM CHLORIDE 0.9 % IV SOLN
2.0000 g | Freq: Two times a day (BID) | INTRAVENOUS | Status: DC
  Administered 2022-03-22 – 2022-03-25 (×6): 2 g via INTRAVENOUS
  Filled 2022-03-22 (×6): qty 12.5

## 2022-03-22 MED ORDER — INSULIN DETEMIR 100 UNIT/ML ~~LOC~~ SOLN
13.0000 [IU] | Freq: Every day | SUBCUTANEOUS | Status: DC
  Administered 2022-03-23: 13 [IU] via SUBCUTANEOUS
  Filled 2022-03-22 (×3): qty 0.13

## 2022-03-22 MED ORDER — SODIUM CHLORIDE 0.9 % IV SOLN
1.0000 g | Freq: Once | INTRAVENOUS | Status: AC
  Administered 2022-03-22: 1 g via INTRAVENOUS
  Filled 2022-03-22: qty 10

## 2022-03-22 MED ORDER — INSULIN ASPART 100 UNIT/ML IJ SOLN
0.0000 [IU] | Freq: Every day | INTRAMUSCULAR | Status: DC
  Administered 2022-03-22: 2 [IU] via SUBCUTANEOUS

## 2022-03-22 MED ORDER — HYDRALAZINE HCL 50 MG PO TABS
75.0000 mg | ORAL_TABLET | Freq: Three times a day (TID) | ORAL | Status: DC
  Administered 2022-03-22 – 2022-03-25 (×8): 75 mg via ORAL
  Filled 2022-03-22 (×8): qty 1

## 2022-03-22 MED ORDER — HEPARIN SODIUM (PORCINE) 5000 UNIT/ML IJ SOLN
5000.0000 [IU] | Freq: Three times a day (TID) | INTRAMUSCULAR | Status: DC
  Administered 2022-03-22 – 2022-03-25 (×8): 5000 [IU] via SUBCUTANEOUS
  Filled 2022-03-22 (×8): qty 1

## 2022-03-22 MED ORDER — ATORVASTATIN CALCIUM 40 MG PO TABS
40.0000 mg | ORAL_TABLET | Freq: Every day | ORAL | Status: DC
  Administered 2022-03-22 – 2022-03-25 (×4): 40 mg via ORAL
  Filled 2022-03-22 (×4): qty 1

## 2022-03-22 MED ORDER — AMLODIPINE BESYLATE 10 MG PO TABS
10.0000 mg | ORAL_TABLET | Freq: Every morning | ORAL | Status: DC
  Administered 2022-03-23 – 2022-03-25 (×3): 10 mg via ORAL
  Filled 2022-03-22 (×3): qty 1

## 2022-03-22 MED ORDER — SODIUM CHLORIDE 0.9 % IV SOLN
500.0000 mg | Freq: Once | INTRAVENOUS | Status: AC
  Administered 2022-03-22: 500 mg via INTRAVENOUS
  Filled 2022-03-22: qty 5

## 2022-03-22 MED ORDER — SODIUM CHLORIDE 0.9 % IV BOLUS
1000.0000 mL | Freq: Once | INTRAVENOUS | Status: AC
  Administered 2022-03-22: 1000 mL via INTRAVENOUS

## 2022-03-22 MED ORDER — GUAIFENESIN ER 600 MG PO TB12
600.0000 mg | ORAL_TABLET | Freq: Two times a day (BID) | ORAL | Status: DC
  Administered 2022-03-22 – 2022-03-25 (×6): 600 mg via ORAL
  Filled 2022-03-22 (×6): qty 1

## 2022-03-22 MED ORDER — ACETAMINOPHEN 500 MG PO TABS
1000.0000 mg | ORAL_TABLET | Freq: Once | ORAL | Status: AC
  Administered 2022-03-22: 1000 mg via ORAL
  Filled 2022-03-22: qty 2

## 2022-03-22 MED ORDER — VANCOMYCIN HCL 1250 MG/250ML IV SOLN
1250.0000 mg | INTRAVENOUS | Status: DC
  Administered 2022-03-22: 1250 mg via INTRAVENOUS
  Filled 2022-03-22: qty 250

## 2022-03-22 MED ORDER — ASPIRIN 81 MG PO TBEC
81.0000 mg | DELAYED_RELEASE_TABLET | Freq: Every day | ORAL | Status: DC
  Administered 2022-03-22 – 2022-03-25 (×4): 81 mg via ORAL
  Filled 2022-03-22 (×4): qty 1

## 2022-03-22 MED ORDER — BENZONATATE 100 MG PO CAPS
200.0000 mg | ORAL_CAPSULE | Freq: Once | ORAL | Status: AC
  Administered 2022-03-22: 200 mg via ORAL
  Filled 2022-03-22: qty 2

## 2022-03-22 MED ORDER — MYCOPHENOLATE SODIUM 180 MG PO TBEC
180.0000 mg | DELAYED_RELEASE_TABLET | Freq: Two times a day (BID) | ORAL | Status: DC
  Administered 2022-03-22 – 2022-03-24 (×5): 180 mg via ORAL
  Filled 2022-03-22 (×5): qty 1

## 2022-03-22 MED ORDER — CARVEDILOL 25 MG PO TABS
37.5000 mg | ORAL_TABLET | Freq: Two times a day (BID) | ORAL | Status: DC
  Administered 2022-03-23 – 2022-03-25 (×5): 37.5 mg via ORAL
  Filled 2022-03-22 (×5): qty 1

## 2022-03-22 MED ORDER — LEVOTHYROXINE SODIUM 75 MCG PO TABS
75.0000 ug | ORAL_TABLET | Freq: Every day | ORAL | Status: DC
  Administered 2022-03-23 – 2022-03-25 (×3): 75 ug via ORAL
  Filled 2022-03-22 (×3): qty 1

## 2022-03-22 NOTE — Progress Notes (Signed)
Pharmacy Antibiotic Note  Tim Becker is a 56 y.o. male admitted on 03/22/2022 with pneumonia.  Pharmacy has been consulted for cefepime and vancomycin dosing. Patient immunosuppressed with significant AKI. Scr: 3.09.   Plan: IV Vancomycin 1250 every 48 hours for eAUC 480.  Cefepime 2g q12h.  Follow culture data for de-escalation.  Monitor renal function for dose adjustments as indicated.    Height: '5\' 8"'$  (172.7 cm) Weight: 100.7 kg (222 lb) IBW/kg (Calculated) : 68.4  Temp (24hrs), Avg:100.2 F (37.9 C), Min:98.7 F (37.1 C), Max:102.9 F (39.4 C)  Recent Labs  Lab 03/22/22 1006 03/22/22 1705  WBC 23.4*  --   CREATININE 3.09*  --   LATICACIDVEN  --  0.9    Estimated Creatinine Clearance: 30.7 mL/min (A) (by C-G formula based on SCr of 3.09 mg/dL (H)).    Allergies  Allergen Reactions   Ferric Citrate Nausea And Vomiting    Thank you for allowing pharmacy to be a part of this patient's care.  Esmeralda Arthur, PharmD, BCCCP  03/22/2022 9:05 PM

## 2022-03-22 NOTE — ED Notes (Signed)
ED TO INPATIENT HANDOFF REPORT  ED Nurse Name and Phone #: [phone]  S Name/Age/Gender Tim Becker 56 y.o. male Room/Bed: 001C/001C  Code Status   Code Status: Full Code  Home/SNF/Other Home Patient oriented to: self, place, time, and situation Is this baseline? Yes   Triage Complete: Triage complete  Chief Complaint Community acquired pneumonia [J18.9]  Triage Note Pt arrived POV from home c/o cough, fever, and congestion for a couple weeks.    Allergies Allergies  Allergen Reactions   Ferric Citrate Nausea And Vomiting    Level of Care/Admitting Diagnosis ED Disposition     ED Disposition  Admit   Condition  --   [place]: [place] [[id number]]  Level of Care: Med-Surg [16]  May admit patient to [facility] or [facility] if equivalent level of care is available:: Yes  Covid Evaluation: Confirmed COVID Negative  Diagnosis: Community acquired pneumonia [[id number]]  Admitting Physician: Aldine Contes [phone]  Attending Physician: Aldine Contes [[id number]]  Certification:: I certify this patient will need inpatient services for at least 2 midnights  Estimated Length of Stay: 3          B Medical/Surgery History Past Medical History:  Diagnosis Date   Anemia    Arthritis    Cancer ([place])    Renal Tumor   CKD (chronic kidney disease)    Coronary artery disease    Diabetes ([place])    DM (diabetes mellitus) with complications ([place])    ESRD (end stage renal disease) ([place])    Pre- dialysis   History of colonoscopy 2011   Hypertension    Kidney transplanted 2022   Left kidney mass    Neuropathy    Obesity    Osteomyelitis, chronic, ankle or foot (HCC)    PONV (postoperative nausea and vomiting)    Renal insufficiency    Patient is on Dialysis and receives M,W and F.   S/P BKA (below knee amputation) ([place]) 08/2014   Right , [place] Allegra, [place]   Sleep apnea    CPAP   Thyroid disease    Past Surgical History:   Procedure Laterality Date   BASCILIC VEIN TRANSPOSITION Left 02/17/2015   Procedure: BASCILIC VEIN TRANSPOSITION;  Surgeon: Katha Cabal, MD;  Location: [facility];  Service: Vascular;  Laterality: Left;   BELOW KNEE LEG AMPUTATION Right    CORONARY ANGIOPLASTY WITH STENT PLACEMENT     EYE SURGERY     FOOT SURGERY     Multiple R foot surgery for infection and charcot foot   I & D EXTREMITY Left 04/16/2016   Procedure: IRRIGATION AND DEBRIDEMENT EXTREMITY/GREAT TOE AMP.;  Surgeon: Edrick Kins, DPM;  Location: [facility];  Service: Podiatry;  Laterality: Left;   JOINT REPLACEMENT     LAPAROSCOPIC NEPHRECTOMY, HAND ASSISTED Left 07/11/2015   Procedure: HAND ASSISTED LAPAROSCOPIC NEPHRECTOMY;  Surgeon: Hollice Espy, MD;  Location: [facility];  Service: Urology;  Laterality: Left;   PERIPHERAL VASCULAR CATHETERIZATION Left 12/06/2014   Procedure: A/V Shuntogram/Fistulagram;  Surgeon: Katha Cabal, MD;  Location: [facility];  Service: Cardiovascular;  Laterality: Left;   PERIPHERAL VASCULAR CATHETERIZATION Left 12/06/2014   Procedure: A/V Shunt Intervention;  Surgeon: Katha Cabal, MD;  Location: [facility];  Service: Cardiovascular;  Laterality: Left;   TONSILLECTOMY       A IV Location/Drains/Wounds Patient Lines/Drains/Airways Status     Active Line/Drains/Airways     Name Placement date Placement time Site  Days   Peripheral IV 03/22/22 20 G Right Antecubital 03/22/22  1715  Antecubital  less than 1   Peripheral IV 03/22/22 20 G 1.25" Anterior;Right Forearm 03/22/22  1730  Forearm  less than 1   Fistula / Graft Left Upper arm Arteriovenous fistula --  --  Upper arm  --   Fistula / Graft Left Other (Comment) --  --  Other (Comment)  --   Airway 04/16/16  2004  -- 2166   Incision (Closed) 02/17/15 Arm Left 02/17/15  1431  -- 2590   Incision (Closed) 07/11/15 Abdomen 07/11/15  1340  -- 2446   Incision (Closed) 04/16/16 Foot Left 04/16/16  2049  -- 2166    Incision - 2 Ports Abdomen Left;Upper Left;Lower --  --  -- --   Wound / Incision (Open or Dehisced) 04/15/16 Diabetic ulcer Toe (Comment  which one) Left Pink wound bed with white/ yellow slough that wraps around base of toe 04/15/16  2237  Toe (Comment  which one)  2167            Intake/Output Last 24 hours No intake or output data in the 24 hours ending 03/22/22 1857  Labs/Imaging Results for orders placed or performed during the hospital encounter of 03/22/22 (from the past 48 hour(s))  Resp panel by RT-PCR (RSV, Flu A&B, Covid) Anterior Nasal Swab     Status: None   Collection Time: 03/22/22 10:00 AM   Specimen: Anterior Nasal Swab  Result Value Ref Range   SARS Coronavirus 2 by RT PCR NEGATIVE NEGATIVE    Comment: (NOTE) SARS-CoV-2 target nucleic acids are NOT DETECTED.  The SARS-CoV-2 RNA is generally detectable in upper respiratory specimens during the acute phase of infection. The lowest concentration of SARS-CoV-2 viral copies this assay can detect is 138 copies/mL. A negative result does not preclude SARS-Cov-2 infection and should not be used as the sole basis for treatment or other patient management decisions. A negative result may occur with  improper specimen collection/handling, submission of specimen other than nasopharyngeal swab, presence of viral mutation(s) within the areas targeted by this assay, and inadequate number of viral copies(<138 copies/mL). A negative result must be combined with clinical observations, patient history, and epidemiological information. The expected result is Negative.  Fact Sheet for Patients:  EntrepreneurPulse.com.au  Fact Sheet for Healthcare Providers:  IncredibleEmployment.be  This test is no t yet approved or cleared by the [place] FDA and  has been authorized for detection and/or diagnosis of SARS-CoV-2 by FDA under an Emergency Use Authorization (EUA). This EUA will remain   in effect (meaning this test can be used) for the duration of the COVID-19 declaration under Section 564(b)(1) of the Act, 21 U.S.C.section 360bbb-3(b)(1), unless the authorization is terminated  or revoked sooner.       Influenza A by PCR NEGATIVE NEGATIVE   Influenza B by PCR NEGATIVE NEGATIVE    Comment: (NOTE) The Xpert Xpress SARS-CoV-2/FLU/RSV plus assay is intended as an aid in the diagnosis of influenza from Nasopharyngeal swab specimens and should not be used as a sole basis for treatment. Nasal washings and aspirates are unacceptable for Xpert Xpress SARS-CoV-2/FLU/RSV testing.  Fact Sheet for Patients: EntrepreneurPulse.com.au  Fact Sheet for Healthcare Providers: IncredibleEmployment.be  This test is not yet approved or cleared by the [place] FDA and has been authorized for detection and/or diagnosis of SARS-CoV-2 by FDA under an Emergency Use Authorization (EUA). This EUA will remain in effect (meaning this test can be  used) for the duration of the COVID-19 declaration under Section 564(b)(1) of the Act, 21 U.S.C. section 360bbb-3(b)(1), unless the authorization is terminated or revoked.     Resp Syncytial Virus by PCR NEGATIVE NEGATIVE    Comment: (NOTE) Fact Sheet for Patients: EntrepreneurPulse.com.au  Fact Sheet for Healthcare Providers: IncredibleEmployment.be  This test is not yet approved or cleared by the [place] FDA and has been authorized for detection and/or diagnosis of SARS-CoV-2 by FDA under an Emergency Use Authorization (EUA). This EUA will remain in effect (meaning this test can be used) for the duration of the COVID-19 declaration under Section 564(b)(1) of the Act, 21 U.S.C. section 360bbb-3(b)(1), unless the authorization is terminated or revoked.  Performed at [facility], [place] [address], [place], [place] [address]   Comprehensive metabolic  panel     Status: Abnormal   Collection Time: 03/22/22 10:06 AM  Result Value Ref Range   Sodium 135 135 - 145 mmol/L   Potassium 4.5 3.5 - 5.1 mmol/L   Chloride 100 98 - 111 mmol/L   CO2 26 22 - 32 mmol/L   Glucose, Bld 133 (H) 70 - 99 mg/dL    Comment: Glucose reference range applies only to samples taken after fasting for at least 8 hours.   BUN 48 (H) 6 - 20 mg/dL   Creatinine, Ser 3.09 (H) 0.61 - 1.24 mg/dL   Calcium 9.1 8.9 - 10.3 mg/dL   Total Protein 6.6 6.5 - 8.1 g/dL   Albumin 3.3 (L) 3.5 - 5.0 g/dL   AST 27 15 - 41 U/L   ALT 29 0 - 44 U/L   Alkaline Phosphatase 67 38 - 126 U/L   Total Bilirubin 0.9 0.3 - 1.2 mg/dL   GFR, Estimated 23 (L) >60 mL/min    Comment: (NOTE) Calculated using the CKD-EPI Creatinine Equation (2021)    Anion gap 9 5 - 15    Comment: Performed at [facility] [address], [place], [place] [address]  CBC with Differential     Status: Abnormal   Collection Time: 03/22/22 10:06 AM  Result Value Ref Range   WBC 23.4 (H) 4.0 - 10.5 K/uL   RBC 4.15 (L) 4.22 - 5.81 MIL/uL   Hemoglobin 10.2 (L) 13.0 - 17.0 g/dL   HCT 34.9 (L) 39.0 - 52.0 %   MCV 84.1 80.0 - 100.0 fL   MCH 24.6 (L) 26.0 - 34.0 pg   MCHC 29.2 (L) 30.0 - 36.0 g/dL   RDW 13.2 11.5 - 15.5 %   Platelets 371 150 - 400 K/uL   nRBC 0.0 0.0 - 0.2 %   Neutrophils Relative % 85 %   Neutro Abs 19.6 (H) 1.7 - 7.7 K/uL   Lymphocytes Relative 2 %   Lymphs Abs 0.5 (L) 0.7 - 4.0 K/uL   Monocytes Relative 7 %   Monocytes Absolute 1.7 (H) 0.1 - 1.0 K/uL   Eosinophils Relative 0 %   Eosinophils Absolute 0.1 0.0 - 0.5 K/uL   Basophils Relative 0 %   Basophils Absolute 0.1 0.0 - 0.1 K/uL   Immature Granulocytes 6 %   Abs Immature Granulocytes 1.49 (H) 0.00 - 0.07 K/uL    Comment: Performed at [facility], [place] [address] [place],  [address]  Lactic acid, plasma     Status: None   Collection Time: 03/22/22  5:05 PM  Result Value Ref Range   Lactic Acid, Venous 0.9 0.5 -  1.9 mmol/L  Comment: Performed at [facility], [place] [address] [place],  [address]   DG Chest 2 View  Result Date: 03/22/2022 CLINICAL DATA:  Cough and fever EXAM: CHEST - 2 VIEW COMPARISON:  None Available. FINDINGS: The heart size and mediastinal contours are within normal limits. Left lower lobe consolidation. The visualized skeletal structures are unremarkable. IMPRESSION: Left lower lobe consolidation, consistent with pneumonia. Recommend follow-up PA and lateral chest x-ray in 6-8 weeks to ensure resolution. Electronically Signed   By: Yetta Glassman M.D.   On: 03/22/2022 10:43    Pending Labs Unresulted Labs (From admission, onward)     Start     Ordered   03/23/22 0500  Comprehensive metabolic panel  Tomorrow morning,   R       Question:  Specimen collection method  Answer:  IV Team=IV Team collect   03/22/22 1811   03/23/22 0500  CBC  Tomorrow morning,   R       Question:  Specimen collection method  Answer:  IV Team=IV Team collect   03/22/22 1811   03/22/22 1811  HIV Antibody (routine testing w rflx)  (HIV Antibody (Routine testing w reflex) panel)  Once,   R       Question:  Specimen collection method  Answer:  IV Team=IV Team collect   03/22/22 1811   03/22/22 1811  CBC  (heparin)  Once,   R       Comments: Baseline for heparin therapy IF NOT ALREADY DRAWN.  Notify MD if PLT < 100 K.   Question:  Specimen collection method  Answer:  IV Team=IV Team collect   03/22/22 1811   03/22/22 1811  Creatinine, serum  (heparin)  Once,   R       Comments: Baseline for heparin therapy IF NOT ALREADY DRAWN.   Question:  Specimen collection method  Answer:  IV Team=IV Team collect   03/22/22 1811   03/22/22 1416  Lactic acid, plasma  Now then every 2 hours,   R      03/22/22 1416   03/22/22 1416  Blood culture (routine x 2)  BLOOD CULTURE X 2,   R      03/22/22 1416            Vitals/Pain Today's Vitals   03/22/22 1426 03/22/22 1615 03/22/22 1645 03/22/22  1745  BP: (!) 156/70 (!) 167/91 (!) 154/71 (!) 155/85  Pulse: 98 97 100 (!) 105  Resp: '16 12 14 15  '$ Temp: 98.7 F (37.1 C)     TempSrc: Oral     SpO2: 94% 96% 94% 90%  Weight:      Height:      PainSc:        Isolation Precautions No active isolations  Medications Medications  heparin injection 5,000 Units (has no administration in time range)  acetaminophen (TYLENOL) tablet 1,000 mg (1,000 mg Oral Given 03/22/22 1025)  cefTRIAXone (ROCEPHIN) 1 g in sodium chloride 0.9 % 100 mL IVPB (0 g Intravenous Stopped 03/22/22 1815)  azithromycin (ZITHROMAX) 500 mg in sodium chloride 0.9 % 250 mL IVPB (500 mg Intravenous New Bag/Given 03/22/22 1745)  sodium chloride 0.9 % bolus 1,000 mL (1,000 mLs Intravenous New Bag/Given 03/22/22 1738)  benzonatate (TESSALON) capsule 200 mg (200 mg Oral Given 03/22/22 1739)    Mobility walks Low fall risk   Focused Assessments Renal Assessment Handoff:  Hemodialysis Schedule:  Last Hemodialysis date and time:    Restricted appendage: left arm  R Recommendations: See Admitting Provider Note  Report given to:   Additional Notes:  got a kidney transplant a year ago, restricted left arm due to graft. Does produce urine with new kidney

## 2022-03-22 NOTE — H&P (Signed)
Date: 03/22/2022               Patient Name:  Tim Becker MRN: [id number]  DOB: 1965-12-12 Age / Sex: 56 y.o., male   PCP: Ngetich, Nelda Bucks, NP         Medical Service: Internal Medicine Teaching Service         Attending Physician: : Dr. Saverio Danker    First Contact: Romana Juniper, MD      Pager: [facility] [phone]      Second Contact: Buddy Duty, DO      Pager: [facility] [phone]           After Hours (After 5p/  First Contact Pager: [phone]  weekends / holidays): Second Contact Pager: [phone]   SUBJECTIVE   Chief Complaint: worsening cough  History of Present Illness:  Tim Becker is a 56yo M with a PMHx HTN, Diabetes Mellitus, s/p cadaveric kidney transplant in 2022 on immunosuppression, not longer on dialysis, R BKA, presenting with worsening cough and fever.   Patient reports that his symptoms began 3 weeks ago with a cough, sore throat, nasal congestion, HA, and a fever. Wife was sick around the same time. He was not seen by a provider and treated symptoms with OTC medications. Did not test for COVID at home.   He started feeling better until a week ago when noted to have had episodes of fever up to 101, and worsening cough, with yellow sputum and SOB going up the stairs but not SOB at rest. He reports poor po intake for the past 2 days, without nausea or vomiting. He denies diarrhea, abdominal or back pain, or decrease urine output.  He has been taking all his prescribed medications until this AM as he headed to the ED, which include his mycophenolate mofetil and tacrolimus.   In the ED, patient was afebrile, mildly tachycardic, hypoxic, and hypertensive. WBC 23.4. Cr. 3.09 Patient was started on fluids, given rocephin and azithromycin. Patient was admitted to IMTS for further management.   Meds:   Current Meds  Medication Sig   acetaminophen (TYLENOL) 500 MG tablet Take 500 mg by mouth every 6 (six) hours as needed for moderate pain or mild pain.    amLODipine (NORVASC) 10 MG tablet Take 10 mg by mouth in the morning. 9am   aspirin EC 81 MG tablet Take 81 mg by mouth daily. Swallow whole.   atorvastatin (LIPITOR) 40 MG tablet Take 40 mg by mouth daily. 9pm.   carvedilol (COREG) 12.5 MG tablet Take 37.5 mg by mouth in the morning, at noon, and at bedtime. 9am , and 9pm   chlorthalidone (HYGROTON) 25 MG tablet Take 25 mg by mouth daily.   empagliflozin (JARDIANCE) 10 MG TABS tablet Take 10 mg by mouth daily.   furosemide (LASIX) 80 MG tablet Take 80 mg by mouth as needed for edema or fluid.   insulin glargine (LANTUS) 100 unit/mL SOPN Inject 35 Units into the skin daily before breakfast.   insulin lispro (HUMALOG) 100 UNIT/ML injection Inject 3-17 Units into the skin 2 (two) times daily as needed for high blood sugar.   Iron, Ferrous Sulfate, 325 (65 Fe) MG TABS Take 325 mg by mouth daily.   levothyroxine (SYNTHROID) 75 MCG tablet Take 75 mcg by mouth daily before breakfast. 7am   mycophenolate (MYFORTIC) 180 MG EC tablet Take 180 mg by mouth in the morning and at bedtime. 3 tablets at 9am and 9pm   Semaglutide, 1 MG/DOSE, (OZEMPIC, 1  MG/DOSE,) 2 MG/1.5ML SOPN Inject 1 mg into the skin once a week. 9am   tacrolimus ER (ENVARSUS XR) 1 MG TB24 Take 6 mg by mouth daily before breakfast.    Past Medical History  Past Surgical History:  Procedure Laterality Date   BASCILIC VEIN TRANSPOSITION Left 02/17/2015   Procedure: BASCILIC VEIN TRANSPOSITION;  Surgeon: Katha Cabal, MD;  Location: [facility];  Service: Vascular;  Laterality: Left;   BELOW KNEE LEG AMPUTATION Right    CORONARY ANGIOPLASTY WITH STENT PLACEMENT     EYE SURGERY     FOOT SURGERY     Multiple R foot surgery for infection and charcot foot   I & D EXTREMITY Left 04/16/2016   Procedure: IRRIGATION AND DEBRIDEMENT EXTREMITY/GREAT TOE AMP.;  Surgeon: Edrick Kins, DPM;  Location: [facility];  Service: Podiatry;  Laterality: Left;   JOINT REPLACEMENT     LAPAROSCOPIC NEPHRECTOMY,  HAND ASSISTED Left 07/11/2015   Procedure: HAND ASSISTED LAPAROSCOPIC NEPHRECTOMY;  Surgeon: Hollice Espy, MD;  Location: [facility];  Service: Urology;  Laterality: Left;   PERIPHERAL VASCULAR CATHETERIZATION Left 12/06/2014   Procedure: A/V Shuntogram/Fistulagram;  Surgeon: Katha Cabal, MD;  Location: [facility];  Service: Cardiovascular;  Laterality: Left;   PERIPHERAL VASCULAR CATHETERIZATION Left 12/06/2014   Procedure: A/V Shunt Intervention;  Surgeon: Katha Cabal, MD;  Location: [facility];  Service: Cardiovascular;  Laterality: Left;   TONSILLECTOMY      Social:  Lives With: wife in [place] Occupation: Does not work Support: Wife and family Level of Function: independent with ADLs and iADLs PCP: Ngetich, Dinah C, NP Substances: denies  Family History: not obtained  Allergies: Allergies as of 03/22/2022 - Review Complete 03/22/2022  Allergen Reaction Noted   Ferric citrate Nausea And Vomiting 06/27/2020    Review of Systems: A complete ROS was negative except as per HPI.   OBJECTIVE:   Physical Exam: Vitals:   03/22/22 1745 03/22/22 2009  BP: (!) 155/85 (!) 184/66  Pulse: (!) 105 (!) 106  Resp: 15 19  Temp:  98.9 F (37.2 C)  SpO2: 90% 94%    Constitutional: well-appearing man sitting in bed, in no acute distress HENT: normocephalic atraumatic, mucous membranes moist Cardiovascular: regular rate and rhythm, no m/r/g, No JVD Pulmonary/Chest: normal work of breathing on room air, decreased breath sounds in L lower base, otherwise clear to auscultation. No crackles or wheezing noted Abdominal: soft, non-tender, non-distended. No fluid wave  Neurological: alert & oriented x 3 MSK: R BKA No pitting edema in L LE Skin: warm and dry. Normal  Psych: Normal mood and affect  Labs: CBC    Component Value Date/Time   WBC 23.4 (H) 03/22/2022 1006   RBC 4.15 (L) 03/22/2022 1006   HGB 10.2 (L) 03/22/2022 1006   HGB 11.1 (L) 02/17/2014  1023   HCT 34.9 (L) 03/22/2022 1006   HCT 35.8 (L) 02/17/2014 1023   PLT 371 03/22/2022 1006   PLT 186 02/17/2014 1023   MCV 84.1 03/22/2022 1006   MCV 78 (L) 02/17/2014 1023   MCH 24.6 (L) 03/22/2022 1006   MCHC 29.2 (L) 03/22/2022 1006   RDW 13.2 03/22/2022 1006   RDW 19.7 (H) 02/17/2014 1023   LYMPHSABS 0.5 (L) 03/22/2022 1006   LYMPHSABS 1.6 02/17/2014 0855   MONOABS 1.7 (H) 03/22/2022 1006   MONOABS 0.6 02/17/2014 0855   EOSABS 0.1 03/22/2022 1006   EOSABS 0.2 02/17/2014 0855   BASOSABS 0.1 03/22/2022 1006  BASOSABS 0.1 02/17/2014 0855     CMP     Component Value Date/Time   NA 135 03/22/2022 1006   NA 142 10/03/2021 0000   NA 142 02/17/2014 1023   K 4.5 03/22/2022 1006   K 4.4 02/17/2014 1023   CL 100 03/22/2022 1006   CL 108 (H) 02/17/2014 1023   CO2 26 03/22/2022 1006   CO2 24 02/17/2014 1023   GLUCOSE 133 (H) 03/22/2022 1006   GLUCOSE 168 (H) 02/17/2014 1023   BUN 48 (H) 03/22/2022 1006   BUN 47 (A) 10/03/2021 0000   BUN 80 (H) 02/17/2014 1023   CREATININE 3.09 (H) 03/22/2022 1006   CREATININE 4.91 (H) 02/17/2014 1023   CALCIUM 9.1 03/22/2022 1006   CALCIUM 9.0 02/17/2014 1023   PROT 6.6 03/22/2022 1006   ALBUMIN 3.3 (L) 03/22/2022 1006   AST 27 03/22/2022 1006   ALT 29 03/22/2022 1006   ALKPHOS 67 03/22/2022 1006   BILITOT 0.9 03/22/2022 1006   GFRNONAA 23 (L) 03/22/2022 1006   GFRNONAA 14 (L) 02/17/2014 1023   GFRAA 5 (L) 12/15/2016 1225   GFRAA 16 (L) 02/17/2014 1023    Imaging: DG Chest 2 View  Result Date: 03/22/2022 CLINICAL DATA:  Cough and fever EXAM: CHEST - 2 VIEW COMPARISON:  None Available. FINDINGS: The heart size and mediastinal contours are within normal limits. Left lower lobe consolidation. The visualized skeletal structures are unremarkable. IMPRESSION: Left lower lobe consolidation, consistent with pneumonia. Recommend follow-up PA and lateral chest x-ray in 6-8 weeks to ensure resolution. Electronically Signed   By: Yetta Glassman M.D.   On: 03/22/2022 10:43      EKG: pending  ASSESSMENT & PLAN:   Assessment & Plan by Problem: Principal Problem:   Community acquired pneumonia   Tim Becker is a 56 y.o. person living with a history of HTN, Diabetes Mellitus, s/p cadaveric kidney transplant in 2022 on immunosuppression, not longer on dialysis, R BKA who presented with worsening cough and fever and admitted for CAP and AKI on hospital day 0  CAP Patient presented after a recent history of cough and URI symptoms with worsening cough and fevers. Mildly tachycardic, other WBC 23.4. CXR with LLL consolidation concerning for CAP. Afebrile, with 94% saturation on RA. Mildly tachycardic. Physical exam with decreased breath sounds on the LL base, patient maintaining good oxygen saturation. Lactic acid 0.9. Low suspicion that this patient's tachycardia and AKI represent sepsis as they are better explained by recent hx of poor po intake. Will expand current work up given immunosuppressed status and continue antibiotic coverage for CAP. Will transition from rocephin to cefepime for pseudomonas coverage -F/u blood cultures x2 -F/u sputum culture -F/u extended respiratory panel, urinary strep pneumo Ag, MRSA nasal swab -F/u procalcitonin -Azithromycin 500 mg  -s/p Rocephin 1g x1 --> cefepime per pharmacy  AKI on CKD4 Baseline Cr. 2.3-2.5. Cr 3.09 and GFR 23 on admission. Patient with poor PO intake for the past 3 days with continuation of all medications, including renally cleared/acting agents. Suspect pre-renal etiology given presentation and progression of disease.  Patient has continued to urinate. Will continue to fluid resuscitate and monitor kidney function. U/A pending. -s/p 1L bolus of normal saline -Trend Cr  S/p cadaveric kidney transplant Followed by Dr. Everrett Coombe at [facility]. Transplanted in 2022, on Tacrolimus - 6 tablets daily, Mycophenolate - twice daily. Consulted with pharmacy, and patient can continue  medications. -Continue immunosuppressive therapy -Consider sending tacrolimus level in setting of AKI  Hypertension  Managed by Dr. Everrett Coombe. Last BOP in clinic with SBP 150-160. Recently incresed spironolactone to 25 mg. He is compliant with medications - Continue amlodipine 10 mg daily, carvedilol 37.5 mg BID, hydralazine 75 mg BID - Hold chlorthalidone 50 mg daily, spironolactone  25 mg daily, and Lasix  DM with neuropathy A1c 5.7 12/2021 Home regimen: Lantus 25 units daily, Semaglutide '1mg'$   weekly, Jardiance 25 mg, Gabapentin 100 mg BID -SSI  Anemia Hgb 10.2 MCV 84.1. Likely of chronic disease. Recent ferritin in Oct >1000. -Continue to monitor  Hypothyroidism Stable. Levothyroxine 75 mcg daily  Gout High uric acid No acute complaints. On Allopurinol 100 mg every other day -Hold in the setting AKI  Chronic LE edema On PRN lasix. No edema appreciated today -Hold Lasix  Diet: Carb-Modified VTE: Heparin Code: Full  Prior to Admission Living Arrangement:  Home Anticipated Discharge Location: Home Barriers to Discharge: clinical improvement  Dispo: Admit patient to Inpatient with expected length of stay greater than 2 midnights.  Signed:   Romana Juniper, MD Internal Medicine Resident PGY-1 03/22/2022, 7:37 PM

## 2022-03-22 NOTE — ED Provider Notes (Signed)
[facility] EMERGENCY DEPARTMENT Provider Note   CSN: [id number] Arrival date & time: 03/22/22  4235     History  Chief Complaint  Patient presents with   Cough    Tim Becker is a 56 y.o. male with a past medical history of type 2 diabetes, hypertension, ESRD status post kidney transplant no longer on dialysis presenting today due to cough and fever.  He reports that he has been having a cough productive of yellow sputum and temperatures as high as 101 for the past 2 weeks.  Denies any shortness of breath or chest pain.  No history of DVT, leg swelling, PE, recent travel or surgery.   Cough Associated symptoms: chills and fever   Associated symptoms: no chest pain, no shortness of breath and no wheezing        Home Medications Prior to Admission medications   Medication Sig Start Date End Date Taking? Authorizing Provider  Accu-Chek Softclix Lancets lancets Test blood sugar 3 times a day. DX: E11.40 11/08/21   Ngetich, Dinah C, NP  acetaminophen (TYLENOL) 500 MG tablet Take 500 mg by mouth every 6 (six) hours as needed for moderate pain or mild pain.    [provider]  amLODipine (NORVASC) 10 MG tablet Take 10 mg by mouth in the morning. 9am 01/12/18   [provider]  aspirin EC 81 MG tablet Take 81 mg by mouth daily. Swallow whole.    [provider]  atorvastatin (LIPITOR) 40 MG tablet Take 40 mg by mouth daily. 9pm.    [provider]  carvedilol (COREG) 12.5 MG tablet Take 37.5 mg by mouth 2 (two) times daily with a meal. 9am , and 9pm    [provider]  chlorthalidone (HYGROTON) 25 MG tablet Take 25 mg by mouth daily.    [provider]  empagliflozin (JARDIANCE) 10 MG TABS tablet Take 10 mg by mouth daily.    [provider]  famotidine (PEPCID) 20 MG tablet Take 20 mg by mouth daily.    [provider]  furosemide (LASIX) 80 MG tablet Take 80 mg by mouth as needed for edema or  fluid.    [provider]  glucose blood test strip 3 times daily DX: E11.40 11/08/21   Ngetich, Dinah C, NP  insulin glargine (LANTUS) 100 unit/mL SOPN Inject 35 Units into the skin daily before breakfast. 02/06/22   Ngetich, Dinah C, NP  insulin lispro (HUMALOG) 100 UNIT/ML injection Inject 3-17 Units into the skin 2 (two) times daily after a meal.    [provider]  Insulin Pen Needle (BD PEN NEEDLE NANO 2ND GEN) 32G X 4 MM MISC Use to test blood sugar four times daily. Dx:E11.40 11/12/21   Ngetich, Dinah C, NP  Iron, Ferrous Sulfate, 325 (65 Fe) MG TABS Take 325 mg by mouth daily. 10/24/21   Ngetich, Dinah C, NP  levothyroxine (SYNTHROID) 75 MCG tablet Take 75 mcg by mouth daily before breakfast. 7am    [provider]  mycophenolate (MYFORTIC) 180 MG EC tablet Take 180 mg by mouth in the morning and at bedtime. 3 tablets at 9am and 9pm    [provider]  Semaglutide, 1 MG/DOSE, (OZEMPIC, 1 MG/DOSE,) 2 MG/1.5ML SOPN Inject 1 mg into the skin once a week. 9am 02/07/22   Ngetich, Dinah C, NP  tacrolimus ER (ENVARSUS XR) 1 MG TB24 Take 7 mg by mouth daily before breakfast.    [provider]  Allergies    Ferric citrate and No known allergies    Review of Systems   Review of Systems  Constitutional:  Positive for appetite change, chills and fever.  Respiratory:  Positive for cough. Negative for shortness of breath and wheezing.   Cardiovascular:  Negative for chest pain and palpitations.    Physical Exam Updated Vital Signs BP (!) 156/70   Pulse 98   Temp 98.7 F (37.1 C) (Oral)   Resp 16   Ht '5\' 8"'$  (1.727 m)   Wt 100.7 kg   SpO2 94%   BMI 33.75 kg/m  Physical Exam Vitals and nursing note reviewed.  Constitutional:      Appearance: Normal appearance.  HENT:     Head: Normocephalic and atraumatic.  Eyes:     General: No scleral icterus.    Conjunctiva/sclera: Conjunctivae normal.  Cardiovascular:     Rate and Rhythm: Normal  rate and regular rhythm.     Comments: Systolic heart murmur Pulmonary:     Effort: Pulmonary effort is normal. No respiratory distress.     Breath sounds: No wheezing.  Musculoskeletal:     Comments: Left upper extremity fistula with palpable thrill  Skin:    Findings: No rash.  Neurological:     Mental Status: He is alert.  Psychiatric:        Mood and Affect: Mood normal.     ED Results / Procedures / Treatments   Labs (all labs ordered are listed, but only abnormal results are displayed) Labs Reviewed  COMPREHENSIVE METABOLIC PANEL - Abnormal; Notable for the following components:      Result Value   Glucose, Bld 133 (*)    BUN 48 (*)    Creatinine, Ser 3.09 (*)    Albumin 3.3 (*)    GFR, Estimated 23 (*)    All other components within normal limits  CBC WITH DIFFERENTIAL/PLATELET - Abnormal; Notable for the following components:   WBC 23.4 (*)    RBC 4.15 (*)    Hemoglobin 10.2 (*)    HCT 34.9 (*)    MCH 24.6 (*)    MCHC 29.2 (*)    Neutro Abs 19.6 (*)    Lymphs Abs 0.5 (*)    Monocytes Absolute 1.7 (*)    Abs Immature Granulocytes 1.49 (*)    All other components within normal limits  RESP PANEL BY RT-PCR (RSV, FLU A&B, COVID)  RVPGX2  CULTURE, BLOOD (ROUTINE X 2)  CULTURE, BLOOD (ROUTINE X 2)  LACTIC ACID, PLASMA  LACTIC ACID, PLASMA    EKG None  Radiology DG Chest 2 View  Result Date: 03/22/2022 CLINICAL DATA:  Cough and fever EXAM: CHEST - 2 VIEW COMPARISON:  None Available. FINDINGS: The heart size and mediastinal contours are within normal limits. Left lower lobe consolidation. The visualized skeletal structures are unremarkable. IMPRESSION: Left lower lobe consolidation, consistent with pneumonia. Recommend follow-up PA and lateral chest x-ray in 6-8 weeks to ensure resolution. Electronically Signed   By: Yetta Glassman M.D.   On: 03/22/2022 10:43    Procedures Procedures   Medications Ordered in ED Medications  acetaminophen (TYLENOL) tablet  1,000 mg (1,000 mg Oral Given 03/22/22 1025)    ED Course/ Medical Decision Making/ A&P Clinical Course as of 03/22/22 1627  Fri Mar 22, 2022  1627 WBC(!): 23.4 [MR]    Clinical Course User Index [MR] Davaughn Hillyard A, PA-C  Medical Decision Making Amount and/or Complexity of Data Reviewed Labs: ordered. Decision-making details documented in ED Course.  Risk Prescription drug management. Decision regarding hospitalization.   56 year old male presenting today with cough and fever.  Differential includes but is not limited to PNA  This is not an exhaustive differential.    Past Medical History / Co-morbidities / Social History: Patient has a history of renal mass for which he received a kidney transplant 11/2020     Physical Exam: Pertinent physical exam findings include Speaking in complete sentences, stable in appearance  Lab Tests: I ordered, and personally interpreted labs.  The pertinent results include: WBC 23.4 Creatinine 3.09, BUN 48, GFR 23.  Mildly worse than baseline   Imaging Studies: I ordered and independently visualized and interpreted x-ray and patient appears to have a left lower lung lobe pneumonia   Medications: I ordered medication including Tylenol, fluid bolus and antibiotics.    MDM/Disposition: This is a 56 year old male presenting today with a cough and a fever for the past couple weeks.  He was found to have pneumonia.  Due to his borderline hypoxia, tachycardia and WBC count 23.4 I believe it is reasonable to admit the patient to the hospital.  Also has an AKI on top of his CKD.  Admit to Dr. Dareen Piano    I discussed this case with my attending physician Dr. Truett Mainland who cosigned this note including patient's presenting symptoms, physical exam, and planned diagnostics and interventions. Attending physician stated agreement with plan or made changes to plan which were implemented.     Final Clinical Impression(s) / ED  Diagnoses Final diagnoses:  Community acquired bacterial pneumonia  AKI (acute kidney injury) ([place])    Rx / DC Orders ED Discharge Orders     None      Admit to Dr. Orlene Plum, [place], PA-C 03/22/22 1814    Cristie Hem, MD 03/22/22 [phone]

## 2022-03-22 NOTE — Hospital Course (Addendum)
56  Relatively healthy Had kidney transplant Cough and fevers couple weeks Cough is now productive yellow frothy Wbc 23.4 Ower left lung pna No covid flu or rsv Febrile Tachy Admit for cap Aki   Renal transplatn - what drugs? Immunosuppressants? - fungal, cmv, parasite? Is he taking prophylactic antimicrobials? Resp panel Strep urine Legionella? - unlikely Not likely aspiration  Still produces urine Had fever and cough 2 weeks ago No headache or chills Was taking cough meds and otc without  Cough and sore throat then 3 weeks ago cough and fevers getting worse Poor po intake Didn't take meds today No dizziness or lightheadedness Some sob walking  Wife was sick couple weeks ago Beige color sputum Kidney transplat year ago Takes   Tacrolimus - 6 tablets daily Mycophenolate - twice daily Didn't take today Dr Jearld Shines unc ch Makes urine Still ujrinating well Viral illness couple weeks ago.   12/25: - has been coughing, not much sputum production - feels ok today - having a hard time eating / does not like the food but he is eating it    --------------  Tim Becker, Tim Becker were admitted to the hospital because you had a fever and worsening cough after having respiratory symptoms for 3 weeks. You white blood cell count was elevated and chest XRAY showed consolidation in your left lower lung lobe. You were treated for community acquired pneumonia with IV antibiotics and your symptoms and white cell count have improved significantly. You have not had a fever in 48 hours. We are discharging you today; you will need to finish your course of antibiotics when you leave the hospital (see instructions below)  New Medications Azithromycin - take one tablet (250 mg) in the AM tomorrow Tuesday, 03/26/2022 Levofloxacin - take one 500 mg tablet in the morning on Wednesday, 03/27/2022 Guifenasin - take this medication twice daily until Thursday 03/28/2022  Please continue  taking your immunosuppressants: Mycophenolate - 540 mg (3 tablets) twice a day Tacrolimus - take '6mg'$  daily before breakfast  Your kidney function tests were elevated when we admitted you to the hospital. It has improved and the creatinine is 2.8. We spoke with Dr. Geronimo Running colleagues at [facility], and they mentioned this is a stable value for you. They would like you to repeat your standing kidney labs this Thursday 12/28 and follow up with Dr. Everrett Coombe on January 19/2023. It is important that you follow up with your kidney doctors and your primary care physician to monitor your symptoms

## 2022-03-22 NOTE — ED Provider Triage Note (Signed)
Emergency Medicine Provider Triage Evaluation Note  Tim Becker , a 56 y.o. male  was evaluated in triage.  Pt complains of 3 weeks of productive cough.  Patient states that he originally came sick 3 weeks ago with some slight improvement before worsening about 1 week ago.  Reports subjective fevers, increasing shortness of breath. Denies chest pain, nausea, vomiting, abdominal pain, diarrhea.  History of kidney transplant currently on tacrolimus and mycophenolate.  Review of Systems  Positive: See above Negative: See above  Physical Exam  BP (!) 174/71 (BP Location: Right Arm)   Pulse (!) 107   Temp (!) 102.9 F (39.4 C) (Oral)   Resp (!) 22   Ht '5\' 8"'$  (1.727 m)   Wt 100.7 kg   SpO2 92%   BMI 33.75 kg/m  Gen:   Awake, no distress , uncomfortable Resp:  Normal effort no wheezing or crackles noted MSK:   Moves extremities without difficulty Other:    Medical Decision Making  Medically screening exam initiated at 10:01 AM.  Appropriate orders placed.  [name] was informed that the remainder of the evaluation will be completed by another provider, this initial triage assessment does not replace that evaluation, and the importance of remaining in the ED until their evaluation is complete.     Luvenia Heller, PA-C 03/22/22 1003

## 2022-03-22 NOTE — ED Triage Notes (Signed)
Pt arrived POV from home c/o cough, fever, and congestion for a couple weeks.

## 2022-03-23 DIAGNOSIS — J189 Pneumonia, unspecified organism: Secondary | ICD-10-CM | POA: Diagnosis not present

## 2022-03-23 DIAGNOSIS — N189 Chronic kidney disease, unspecified: Secondary | ICD-10-CM

## 2022-03-23 DIAGNOSIS — I129 Hypertensive chronic kidney disease with stage 1 through stage 4 chronic kidney disease, or unspecified chronic kidney disease: Secondary | ICD-10-CM | POA: Diagnosis not present

## 2022-03-23 DIAGNOSIS — N184 Chronic kidney disease, stage 4 (severe): Secondary | ICD-10-CM

## 2022-03-23 DIAGNOSIS — N179 Acute kidney failure, unspecified: Secondary | ICD-10-CM

## 2022-03-23 LAB — CBC
HCT: 30.8 % — ABNORMAL LOW (ref 39.0–52.0)
Hemoglobin: 9.2 g/dL — ABNORMAL LOW (ref 13.0–17.0)
MCH: 25.1 pg — ABNORMAL LOW (ref 26.0–34.0)
MCHC: 29.9 g/dL — ABNORMAL LOW (ref 30.0–36.0)
MCV: 84.2 fL (ref 80.0–100.0)
Platelets: 348 10*3/uL (ref 150–400)
RBC: 3.66 MIL/uL — ABNORMAL LOW (ref 4.22–5.81)
RDW: 13.3 % (ref 11.5–15.5)
WBC: 20.7 10*3/uL — ABNORMAL HIGH (ref 4.0–10.5)
nRBC: 0.1 % (ref 0.0–0.2)

## 2022-03-23 LAB — RESPIRATORY PANEL BY PCR

## 2022-03-23 LAB — URINALYSIS, COMPLETE (UACMP) WITH MICROSCOPIC
Bilirubin Urine: NEGATIVE
Glucose, UA: 150 mg/dL — AB
Hgb urine dipstick: NEGATIVE
Ketones, ur: NEGATIVE mg/dL
Leukocytes,Ua: NEGATIVE
Nitrite: NEGATIVE
Protein, ur: 30 mg/dL — AB
Specific Gravity, Urine: 1.023 (ref 1.005–1.030)
pH: 5 (ref 5.0–8.0)

## 2022-03-23 LAB — GLUCOSE, CAPILLARY
Glucose-Capillary: 98 mg/dL (ref 70–99)
Glucose-Capillary: 112 mg/dL — ABNORMAL HIGH (ref 70–99)
Glucose-Capillary: 139 mg/dL — ABNORMAL HIGH (ref 70–99)
Glucose-Capillary: 97 mg/dL (ref 70–99)
Glucose-Capillary: 117 mg/dL — ABNORMAL HIGH (ref 70–99)

## 2022-03-23 LAB — COMPREHENSIVE METABOLIC PANEL
ALT: 28 U/L (ref 0–44)
AST: 26 U/L (ref 15–41)
Albumin: 2.8 g/dL — ABNORMAL LOW (ref 3.5–5.0)
Alkaline Phosphatase: 61 U/L (ref 38–126)
Anion gap: 10 (ref 5–15)
BUN: 47 mg/dL — ABNORMAL HIGH (ref 6–20)
CO2: 23 mmol/L (ref 22–32)
Calcium: 8.7 mg/dL — ABNORMAL LOW (ref 8.9–10.3)
Chloride: 104 mmol/L (ref 98–111)
Creatinine, Ser: 2.81 mg/dL — ABNORMAL HIGH (ref 0.61–1.24)
GFR, Estimated: 26 mL/min — ABNORMAL LOW (ref >60)
Glucose, Bld: 98 mg/dL (ref 70–99)
Potassium: 4.5 mmol/L (ref 3.5–5.1)
Sodium: 137 mmol/L (ref 135–145)
Total Bilirubin: 0.9 mg/dL (ref 0.3–1.2)
Total Protein: 6.1 g/dL — ABNORMAL LOW (ref 6.5–8.1)

## 2022-03-23 LAB — HIV ANTIBODY (ROUTINE TESTING W REFLEX): HIV Screen 4th Generation wRfx: NONREACTIVE

## 2022-03-23 LAB — PROCALCITONIN: Procalcitonin: 0.64 ng/mL

## 2022-03-23 LAB — MRSA NEXT GEN BY PCR, NASAL: MRSA by PCR Next Gen: NOT DETECTED

## 2022-03-23 MED ORDER — DEXTROSE 5 % IV SOLN
250.0000 mg | INTRAVENOUS | Status: DC
  Administered 2022-03-23 – 2022-03-24 (×2): 250 mg via INTRAVENOUS
  Filled 2022-03-23 (×4): qty 2.5

## 2022-03-23 MED ORDER — INSULIN GLARGINE-YFGN 100 UNIT/ML ~~LOC~~ SOLN
13.0000 [IU] | Freq: Every day | SUBCUTANEOUS | Status: DC
  Administered 2022-03-24 – 2022-03-25 (×2): 13 [IU] via SUBCUTANEOUS
  Filled 2022-03-23 (×2): qty 0.13

## 2022-03-23 MED ORDER — VANCOMYCIN HCL 750 MG/150ML IV SOLN
750.0000 mg | INTRAVENOUS | Status: DC
  Administered 2022-03-23: 750 mg via INTRAVENOUS
  Filled 2022-03-23: qty 150

## 2022-03-23 MED ORDER — ACETAMINOPHEN 325 MG PO TABS
650.0000 mg | ORAL_TABLET | Freq: Four times a day (QID) | ORAL | Status: DC | PRN
  Administered 2022-03-23 (×2): 650 mg via ORAL
  Filled 2022-03-23 (×2): qty 2

## 2022-03-23 NOTE — Progress Notes (Signed)
Pt has a temperature of 100.1 F. MD on call notified recommending tylenol prn.

## 2022-03-23 NOTE — Progress Notes (Signed)
Pharmacy Antibiotic Note- follow-up  Tim Becker is a 56 y.o. male admitted on 03/22/2022 with pneumonia.  Pharmacy has been consulted for cefepime and vancomycin dosing. Patient immunosuppressed with significant AKI. Scr: 3.09.   Plan: Changed Vancomycin to 750 mg every 24 hours given change in RF (Scr 3.09 >> 2.81)  for eAUC 463 mcg*hr/mL.  Cefepime 2g q12h.  Follow culture data for de-escalation.  Monitor renal function for dose adjustments as indicated.    Height: '5\' 8"'$  (172.7 cm) Weight: 100.1 kg (220 lb 10.9 oz) IBW/kg (Calculated) : 68.4  Temp (24hrs), Avg:100 F (37.8 C), Min:98.7 F (37.1 C), Max:102.9 F (39.4 C)  Recent Labs  Lab 03/22/22 1006 03/22/22 1705 03/22/22 2206 03/23/22 0333  WBC 23.4*  --   --  20.7*  CREATININE 3.09*  --   --  2.81*  LATICACIDVEN  --  0.9 0.7  --      Estimated Creatinine Clearance: 33.7 mL/min (A) (by C-G formula based on SCr of 2.81 mg/dL (H)).    Allergies  Allergen Reactions   Ferric Citrate Nausea And Vomiting    Thank you for allowing pharmacy to be a part of this patient's care.  Vaughan Basta BS, PharmD, BCPS Clinical Pharmacist 03/23/2022 7:56 AM  Contact: [phone] after 3 PM  "Be curious, not judgmental..." -Jamal Maes

## 2022-03-23 NOTE — Progress Notes (Signed)
Internal Medicine Attending:   I have seen and evaluated this patient and I have discussed the plan of care with the house staff. I reviewed the resident's note and I agree with the resident's findings and plan as documented in the resident's note with the correction that Cr has improved to 2.81 today, NOT 1.7 as written. Please see my H&P attestation for further details.  Charise Killian, MD 03/23/2022, 2:34 PM

## 2022-03-23 NOTE — Progress Notes (Signed)
Hospital day#1 Subjective:   Summary: Tim Becker is a 56yo M with a PMHx HTN, Diabetes Mellitus, s/p cadaveric kidney transplant in 2022 on immunosuppression, not longer on dialysis, R BKA, presenting with worsening cough and fever being treated for post viral pneumonia  Overnight Events: temperature to 100.1, received 1x dose of tylenol   He is still having a dry cough this morning. He is not happy about his diet - he did not get bacon because of his heart healthy diet. Denies any abd pain, had a bowel movement.   Objective:  Vital signs in last 24 hours: Vitals:   03/22/22 2141 03/23/22 0133 03/23/22 0425 03/23/22 0604  BP: (!) 173/82 (!) 156/63 (!) 164/69   Pulse: (!) 107 96 95   Resp: '16 17 18   '$ Temp: 99.9 F (37.7 C) 100 F (37.8 C) 100.1 F (37.8 C) 99.2 F (37.3 C)  TempSrc: Oral Oral Oral Oral  SpO2: 94% 99% 94%   Weight:  100.1 kg    Height:       Supplemental O2: Room Air SpO2: 94 % Filed Weights   03/22/22 0945 03/23/22 0133  Weight: 100.7 kg 100.1 kg    Physical Exam:  Constitutional:well-appearing man sitting in recliner, in no acute distress HENT: normocephalic atraumatic, moist mucous membranes Cardiovascular: regular rate and rhythm, no m/r/g Pulmonary/Chest: normal work of breathing on room air, LLL with decreased sounds, otherwise clear to auscultation. No wheezing Abdominal: Normal BS, soft, non-tender, non-distended.  MSK: normal bulk and tone.No L LE Pitting edema.  Neurological: alert & oriented x 3 Skin: warm and dry Psych: pleasant mood and affect    Intake/Output Summary (Last 24 hours) at 03/23/2022 0910 Last data filed at 03/23/2022 0300 Gross per 24 hour  Intake 0 ml  Output --  Net 0 ml   Net IO Since Admission: 0 mL [03/23/22 0910]  Pertinent Labs:    Latest Ref Rng & Units 03/23/2022    3:33 AM 03/22/2022   10:06 AM 02/07/2022    8:30 AM  CBC  WBC 4.0 - 10.5 K/uL 20.7  23.4  7.9   Hemoglobin 13.0 - 17.0 g/dL 9.2   10.2  11.7   Hematocrit 39.0 - 52.0 % 30.8  34.9  39.9   Platelets 150 - 400 K/uL 348  371  200        Latest Ref Rng & Units 03/23/2022    3:33 AM 03/22/2022   10:06 AM 02/07/2022    8:30 AM  CMP  Glucose 70 - 99 mg/dL 98  133  160   BUN 6 - 20 mg/dL 47  48  75   Creatinine 0.61 - 1.24 mg/dL 2.81  3.09  2.87   Sodium 135 - 145 mmol/L 137  135  141   Potassium 3.5 - 5.1 mmol/L 4.5  4.5  4.4   Chloride 98 - 111 mmol/L 104  100  106   CO2 22 - 32 mmol/L '23  26  25   '$ Calcium 8.9 - 10.3 mg/dL 8.7  9.1  9.2   Total Protein 6.5 - 8.1 g/dL 6.1  6.6    Total Bilirubin 0.3 - 1.2 mg/dL 0.9  0.9    Alkaline Phos 38 - 126 U/L 61  67    AST 15 - 41 U/L 26  27    ALT 0 - 44 U/L 28  29      Imaging: DG Chest 2 View  Result Date: 03/22/2022 CLINICAL  DATA:  Cough and fever EXAM: CHEST - 2 VIEW COMPARISON:  None Available. FINDINGS: The heart size and mediastinal contours are within normal limits. Left lower lobe consolidation. The visualized skeletal structures are unremarkable. IMPRESSION: Left lower lobe consolidation, consistent with pneumonia. Recommend follow-up PA and lateral chest x-ray in 6-8 weeks to ensure resolution. Electronically Signed   By: Yetta Glassman M.D.   On: 03/22/2022 10:43    Assessment/Plan:   Principal Problem:   Community acquired pneumonia   Patient Summary: Tim Becker is a 56 y.o. person living with a history of HTN, Diabetes Mellitus, s/p cadaveric kidney transplant in 2022 on immunosuppression, not longer on dialysis, R BKA who presented with worsening cough and fever and admitted for CAP    Post viral CAP Low grade fever overnight with 1x dose of tylenol otherwise stable vitals. -COVID, RSV, influenza RVP. CXR with LLL consolidation. WBC down trending, 23.4->20.7 today. Lactic acid wnl and procalcitonin 0.48 at baseline ->0.64. Cultures pending. Continuing broad spectrum antibiotic coverage for MRSA, pseudomonas, and usual CAP pathogens in the setting of  immunosuppression. Sill awaiting extended infectious work up to narrow antibiotic coverage -s/p ceftriaxone 1 dose -F/u collected MRSA nares, extended viral panel, sputum and blood cultures -Vancomycin per pharmacy (12/22- -Cefepime per pharmacy (12/22- -Azithromycin 250 mg daily (12/22- -Continue Guaifenesin BID  AKI on CKD4, improving.  Baseline Cr. 2.3-2.5. Cr 3.09->1.7 today, improving s/p 1L fluid bolus and increased PO intake. --trend Cr -Encouraged PO intake -Hold renally cleared/acting medications  Hypertension Managed by Dr. [facility]rett [facility] at [facility]. Hypertensive to SBP 150-160 on home amlodipine 10, hydralazine. Has not received home Carvedilol yet, but it has been re-started. -Continue to monitor -Continue amlodipine 10 mg daily, carvedilol 37.5 mg BID, hydralazine 75 mg BID - Hold chlorthalidone 50 mg daily, spironolactone  25 mg daily, and Lasix  DM with neuropathy A1c 5.7 12/2021 Home regimen: Lantus 25 units daily, Semaglutide '1mg'$   weekly, Jardiance 25 mg. BG this AM 98. Gabapentin 100 mg BID. Received Levemir 15 units today -Switch to Lantus tomorrow AM -SSI  Anemia Hgb 10.2 MCV 84.1 on admission.  Likely of chronic disease. Recent ferritin in Oct >1000. Hgb 9.2 today. -Continue to monitor   Hypothyroidism Stable. Levothyroxine 75 mcg daily   Gout High uric acid No acute complaints. On Allopurinol 100 mg every other day -Hold in the setting AKI   Chronic LE edema On PRN lasix. No edema appreciated today -Hold Lasix  Diet: Carb-Modified VTE: Heparin Code: Full PT/OT recs: none   Dispo: Anticipated discharge to Home in 2 days pending clinical improvement.   Romana Juniper, MD Internal Medicine Resident PGY-1 Please contact the on call pager after 5 pm and on weekends at [phone].

## 2022-03-24 LAB — GLUCOSE, CAPILLARY
Glucose-Capillary: 109 mg/dL — ABNORMAL HIGH (ref 70–99)
Glucose-Capillary: 111 mg/dL — ABNORMAL HIGH (ref 70–99)
Glucose-Capillary: 133 mg/dL — ABNORMAL HIGH (ref 70–99)
Glucose-Capillary: 150 mg/dL — ABNORMAL HIGH (ref 70–99)

## 2022-03-24 LAB — CBC
HCT: 32.6 % — ABNORMAL LOW (ref 39.0–52.0)
Hemoglobin: 9.4 g/dL — ABNORMAL LOW (ref 13.0–17.0)
MCH: 24.3 pg — ABNORMAL LOW (ref 26.0–34.0)
MCHC: 28.8 g/dL — ABNORMAL LOW (ref 30.0–36.0)
MCV: 84.2 fL (ref 80.0–100.0)
Platelets: 310 10*3/uL (ref 150–400)
RBC: 3.87 MIL/uL — ABNORMAL LOW (ref 4.22–5.81)
RDW: 13.4 % (ref 11.5–15.5)
WBC: 14.6 10*3/uL — ABNORMAL HIGH (ref 4.0–10.5)
nRBC: 0 % (ref 0.0–0.2)

## 2022-03-24 LAB — BASIC METABOLIC PANEL
Anion gap: 14 (ref 5–15)
BUN: 52 mg/dL — ABNORMAL HIGH (ref 6–20)
CO2: 20 mmol/L — ABNORMAL LOW (ref 22–32)
Calcium: 9.1 mg/dL (ref 8.9–10.3)
Chloride: 100 mmol/L (ref 98–111)
Creatinine, Ser: 2.84 mg/dL — ABNORMAL HIGH (ref 0.61–1.24)
GFR, Estimated: 25 mL/min — ABNORMAL LOW (ref >60)
Glucose, Bld: 129 mg/dL — ABNORMAL HIGH (ref 70–99)
Potassium: 4.5 mmol/L (ref 3.5–5.1)
Sodium: 134 mmol/L — ABNORMAL LOW (ref 135–145)

## 2022-03-24 LAB — PROCALCITONIN: Procalcitonin: 0.78 ng/mL

## 2022-03-24 MED ORDER — SODIUM CHLORIDE 3 % IN NEBU
4.0000 mL | INHALATION_SOLUTION | Freq: Once | RESPIRATORY_TRACT | Status: AC
  Administered 2022-03-24: 4 mL via RESPIRATORY_TRACT
  Filled 2022-03-24: qty 4

## 2022-03-24 NOTE — Plan of Care (Signed)

## 2022-03-24 NOTE — Progress Notes (Addendum)
HD#2 SUBJECTIVE:  Patient Summary: Tim Becker is a 55 y.o. with a pertinent PMH of HTN, DM, s/p cadaveric kidney transplant in 2022 on immunosuppression and no longer on dialysis, s/p R BKA, who presented with worsening cough and admitted for CAP.   Overnight Events: No acute events overnight  Interim History: This is hospital day 2 for this patient who was seen and evaluated at the bedside this morning. He states he is coughing more now and having a lot more mucus production. His appetite is improved, but he did not feel like eating breakfast this morning. The patient denies any urinary sxs, chest pain, SOB, DOE, or bowel changes.    OBJECTIVE:  Vital Signs: Vitals:   03/23/22 2143 03/24/22 0608 03/24/22 0923 03/24/22 0924  BP: (!) 155/67 (!) 144/125 (!) 156/65 (!) 156/65  Pulse: 80 93 93   Resp: 18 18    Temp: 98.6 F (37 C) 98.7 F (37.1 C) 98.3 F (36.8 C)   TempSrc: Oral Oral Oral   SpO2: 97% 98% 94%   Weight: 100 kg     Height:       Supplemental O2: Room Air SpO2: 94 % O2 Flow Rate (L/min): 0 L/min FiO2 (%): 21 %  Filed Weights   03/22/22 0945 03/23/22 0133 03/23/22 2143  Weight: 100.7 kg 100.1 kg 100 kg     Intake/Output Summary (Last 24 hours) at 03/24/2022 1322 Last data filed at 03/24/2022 1238 Gross per 24 hour  Intake 786 ml  Output 0 ml  Net 786 ml   Net IO Since Admission: 1,026 mL [03/24/22 1322]  Physical Exam: Physical Exam General: Pleasant, well-appearing sitting in bed. No acute distress. Head: Normocephalic. Atraumatic. CV: RRR. No murmurs, rubs, or gallops. No LE edema Pulmonary: Lungs with mildly decreased sounds on the LLB, otherwise CTAB. Normal effort. No wheezing or rales. Abdominal: Soft, nontender, nondistended. Normal bowel sounds. Extremities: Palpable radial and DP pulses on L lower extremity. Normal ROM. Skin: Warm and dry. No obvious rash or lesions. Neuro: A&Ox3. Moves all extremities. Normal sensation. No focal  deficit. Psych: Normal mood and affect     ASSESSMENT/PLAN:  Assessment: Principal Problem:   Community acquired pneumonia Active Problems:   AKI (acute kidney injury) ([place])   Plan: Post viral CAP The patient had one fever in the last 24 hrs, up to 100.15F. Vitals have otherwise been stable and the patient has remained afebrile thus far today. Respiratory panel positive for Coronavirus NL63 (commonly seen in immunocompromised population) and blood cultures have had no growth at 2 days. MRSA nasal swab negative. WBC count down trending to 14.6 from 20.7 yesterday. Procalcitonin level continues to rise, up to 0.78 today.  - Continue cefepime and azithromycin, discontinued vancomycin today - Curbsiding ID regarding abx coverage and possible need for respiratory fluoroquinolone  - Repeat expectorated sputum culture, ordered - Avoid cough suppressants given increased BP - Continue guaifenesin BID - Trial of hypertonic saline  AKI on CKD 4 Status post cadaveric kidney transplant Baseline Cr 2.3-2.5. Cr today is unchanged from the day prior, at 2.8. The patient follows with Dr. Everrett Coombe at [facility], and we have informed the on-call team that this patient is hospitalized.  - Daily BMP - Avoid nephrotoxic agents - Encourage PO intake  Hypertension Remains hypertensive with SBP in the 140-160s. - Continue amlodipine 10 mg daily - Continue coreg 37.5 mg bid - Continue hydralazine 75 mg tid - Continue to hold home chlorthalidone, spironolactone, and lasix  Diabetes c/b diabetic neuropathy A1c 5.7% in October. At home, patient is on lantus 25u daily, semaglutide 1 mg/week, and jardiance 25 mg daily.  - Semglee 13u daily - SSI  Anemia Hb stable at 9.4 this morning. No evidence of bleeding. - Continue to monitor   Hypothyroidism - Continue home synthroid 75 mcg daily  Gout - Hold home allopurinol 100 mg every other day  Chronic LE edema - Holding home prn lasix  Best Practice: Diet:  Diabetic diet/heart healthy IVF: Fluids: none VTE: heparin injection 5,000 Units Start: 03/22/22 2200 Code: Full AB: Azithromycin, cefepime Therapy Recs: None DISPO: Anticipated discharge in 1 days to Home pending  medical stability .  Signature:  Romana Juniper, MD   Please contact the on call pager after 5 pm and on weekends at [phone].

## 2022-03-25 ENCOUNTER — Inpatient Hospital Stay (HOSPITAL_COMMUNITY): Payer: Medicare Other

## 2022-03-25 DIAGNOSIS — J189 Pneumonia, unspecified organism: Secondary | ICD-10-CM | POA: Diagnosis not present

## 2022-03-25 DIAGNOSIS — N184 Chronic kidney disease, stage 4 (severe): Secondary | ICD-10-CM | POA: Diagnosis not present

## 2022-03-25 DIAGNOSIS — N179 Acute kidney failure, unspecified: Secondary | ICD-10-CM | POA: Diagnosis not present

## 2022-03-25 DIAGNOSIS — I129 Hypertensive chronic kidney disease with stage 1 through stage 4 chronic kidney disease, or unspecified chronic kidney disease: Secondary | ICD-10-CM | POA: Diagnosis not present

## 2022-03-25 LAB — BASIC METABOLIC PANEL
Anion gap: 11 (ref 5–15)
BUN: 57 mg/dL — ABNORMAL HIGH (ref 6–20)
CO2: 20 mmol/L — ABNORMAL LOW (ref 22–32)
Calcium: 9.1 mg/dL (ref 8.9–10.3)
Chloride: 104 mmol/L (ref 98–111)
Creatinine, Ser: 2.89 mg/dL — ABNORMAL HIGH (ref 0.61–1.24)
GFR, Estimated: 25 mL/min — ABNORMAL LOW (ref >60)
Glucose, Bld: 137 mg/dL — ABNORMAL HIGH (ref 70–99)
Potassium: 4.4 mmol/L (ref 3.5–5.1)
Sodium: 135 mmol/L (ref 135–145)

## 2022-03-25 LAB — CBC
HCT: 34.8 % — ABNORMAL LOW (ref 39.0–52.0)
Hemoglobin: 10 g/dL — ABNORMAL LOW (ref 13.0–17.0)
MCH: 24.2 pg — ABNORMAL LOW (ref 26.0–34.0)
MCHC: 28.7 g/dL — ABNORMAL LOW (ref 30.0–36.0)
MCV: 84.1 fL (ref 80.0–100.0)
Platelets: 345 10*3/uL (ref 150–400)
RBC: 4.14 MIL/uL — ABNORMAL LOW (ref 4.22–5.81)
RDW: 13.4 % (ref 11.5–15.5)
WBC: 10.4 10*3/uL (ref 4.0–10.5)
nRBC: 0 % (ref 0.0–0.2)

## 2022-03-25 LAB — GLUCOSE, CAPILLARY: Glucose-Capillary: 122 mg/dL — ABNORMAL HIGH (ref 70–99)

## 2022-03-25 MED ORDER — AZITHROMYCIN 500 MG PO TABS
250.0000 mg | ORAL_TABLET | Freq: Every day | ORAL | Status: DC
  Administered 2022-03-25: 250 mg via ORAL
  Filled 2022-03-25: qty 1

## 2022-03-25 MED ORDER — MYCOPHENOLATE SODIUM 180 MG PO TBEC
540.0000 mg | DELAYED_RELEASE_TABLET | Freq: Two times a day (BID) | ORAL | Status: DC
  Administered 2022-03-25: 540 mg via ORAL
  Filled 2022-03-25: qty 3

## 2022-03-25 MED ORDER — AZITHROMYCIN 250 MG PO TABS: 250.0000 mg | ORAL_TABLET | Freq: Once | ORAL | 0 refills | Status: AC

## 2022-03-25 MED ORDER — GUAIFENESIN ER 600 MG PO TB12: 600.0000 mg | ORAL_TABLET | Freq: Two times a day (BID) | ORAL | 0 refills | Status: AC

## 2022-03-25 MED ORDER — MYCOPHENOLATE SODIUM 180 MG PO TBEC: 540.0000 mg | DELAYED_RELEASE_TABLET | Freq: Two times a day (BID) | ORAL | Status: AC

## 2022-03-25 MED ORDER — MYCOPHENOLATE SODIUM 180 MG PO TBEC: 540.0000 mg | DELAYED_RELEASE_TABLET | Freq: Two times a day (BID) | ORAL | Status: DC

## 2022-03-25 MED ORDER — LEVOFLOXACIN 500 MG PO TABS
750.0000 mg | ORAL_TABLET | Freq: Once | ORAL | Status: AC
  Administered 2022-03-25: 750 mg via ORAL
  Filled 2022-03-25: qty 2

## 2022-03-25 MED ORDER — LEVOFLOXACIN 500 MG PO TABS: 500.0000 mg | ORAL_TABLET | Freq: Once | ORAL | 0 refills | Status: AC

## 2022-03-25 NOTE — Discharge Summary (Addendum)
Name: Tim Becker MRN: [id number] DOB: Aug 01, 1965 56 y.o. PCP: Tim Hughs, NP  Date of Admission: 03/22/2022  9:39 AM Date of Discharge: 03/25/2022 11:38 AM Attending Physician: Dr. Saverio Becker  Discharge Diagnosis: Principal Problem:   Community acquired pneumonia Active Problems:   AKI (acute kidney injury) [facility])    Discharge Medications: Allergies as of 03/25/2022       Reactions   Ferric Citrate Nausea And Vomiting        Medication List     TAKE these medications    Accu-Chek Softclix Lancets lancets Test blood sugar 3 times a day. DX: E11.40   acetaminophen 500 MG tablet Commonly known as: TYLENOL Take 500 mg by mouth every 6 (six) hours as needed for moderate pain or mild pain.   amLODipine 10 MG tablet Commonly known as: NORVASC Take 10 mg by mouth in the morning. 9am   aspirin EC 81 MG tablet Take 81 mg by mouth daily. Swallow whole.   atorvastatin 40 MG tablet Commonly known as: LIPITOR Take 40 mg by mouth daily. 9pm.   azithromycin 250 MG tablet Commonly known as: ZITHROMAX Take 1 tablet (250 mg total) by mouth once for 1 dose. Start taking on: March 26, 2022   BD Pen Needle Nano 2nd Gen 32G X 4 MM Misc Generic drug: Insulin Pen Needle Use to test blood sugar four times daily. Dx:E11.40   carvedilol 12.5 MG tablet Commonly known as: COREG Take 37.5 mg by mouth in the morning, at noon, and at bedtime. 9am , and 9pm   chlorthalidone 25 MG tablet Commonly known as: HYGROTON Take 25 mg by mouth daily.   furosemide 80 MG tablet Commonly known as: LASIX Take 80 mg by mouth as needed for edema or fluid.   glucose blood test strip 3 times daily DX: E11.40   guaiFENesin 600 MG 12 hr tablet Commonly known as: MUCINEX Take 1 tablet (600 mg total) by mouth 2 (two) times daily for 7 days.   hydrALAZINE 25 MG tablet Commonly known as: APRESOLINE Take 75 mg by mouth 3 (three) times daily.   insulin glargine 100 unit/mL Sopn Commonly  known as: LANTUS Inject 35 Units into the skin daily before breakfast.   insulin lispro 100 UNIT/ML injection Commonly known as: HUMALOG Inject 3-17 Units into the skin 2 (two) times daily as needed for high blood sugar.   Iron (Ferrous Sulfate) 325 (65 Fe) MG Tabs Take 325 mg by mouth daily.   Jardiance 10 MG Tabs tablet Generic drug: empagliflozin Take 10 mg by mouth daily.   levofloxacin 500 MG tablet Commonly known as: LEVAQUIN Take 1 tablet (500 mg total) by mouth once for 1 dose. Start taking on: March 27, 2022   levothyroxine 75 MCG tablet Commonly known as: SYNTHROID Take 75 mcg by mouth daily before breakfast. 7am   mycophenolate 180 MG EC tablet Commonly known as: MYFORTIC Take 3 tablets (540 mg total) by mouth in the morning and at bedtime. 3 tablets (540 mg total per dose) at 9am and 9pm What changed:  how much to take additional instructions   Ozempic (1 MG/DOSE) 2 MG/1.5ML Sopn Generic drug: Semaglutide (1 MG/DOSE) Inject 1 mg into the skin once a week. 9am   tacrolimus ER 1 MG Tb24 Commonly known as: ENVARSUS XR Take 6 mg by mouth daily before breakfast.        Disposition and follow-up:   Tim Becker was discharged from [facility] in Stable condition.  At the hospital follow up visit please address:  1.  Follow-up:  *Post viral CAP -Ensure resolution of symptoms -Ensure resolution of leukocytosis -Ensure completion of antibiotic coverage -Recommend follow-up PA and lateral chest x-ray in 6-8 weeks to ensure resolution of left lower lobe consolidation per radiology.  *AKI on CKD4 *Cadaveric transplant -Follow up future standing laboratory at discharge -Ensure medication adherence and maintenance of renal function  2.  Labs / imaging needed at time of follow-up: BMP, immunosuppressant levels, cbc  3.  Pending labs/ test needing follow-up: sputum cultures  4.  Medication Changes:  ADDED Azithromycin - take one  tablet (250 mg) in the AM tomorrow Tuesday, 03/26/2022 Levofloxacin - take one 500 mg tablet in the morning on Wednesday, 03/27/2022   Follow-up Appointments:  Follow-up Information     Tim Becker, Tim Becker. Go on 04/19/2022.   Specialty: Pediatric Nephrology Why: Appt with Dr. Everrett Becker on 1/19 Contact information: [address] [place] [address] [phone]                 Hospital Course by problem list: Tim Becker is a 56yo person living with a history if HTN, DM, s/p cadaveric kidney transplant in 2022 on immunosuppression, R BKA, admitted after 3 week course of URI with worsening cough, treated for community acquired pneumonia in stable condition being discharged today  Post-viral CAP Patient presented after 3-week history of URI and found to have LLL consolidation and WBC to 23.4. Initially treated with vancomycin, cefepime, azithromycin to cover for MRSA and pseudomonas. After negative MRSA test, patient was continued on standard CAP therapy  w/ pseudomonal coverage. Blood cultures with no growths >48 hrs. Attempted sputum culture, with insufficient volume for testing. Sent second sample on day of discharge. Patient's wbc is 10.4 and  CrCl is20 today; he received a one time dose of Levofloxacin 750 mg and is to have last dose of Levofloxacin (500 mg) on 12/27 to complete 7 day course given creatinine clearance. He will finish last dose of azithromycin on 12/26. This was all documented on patient's discharge instructions.   AKI on CD4 S/p cadaveric kidney transplant Patient presented with Cr 3.09,thought to be prerenal in the setting of decreased PO intake in the setting of CAP. Cr improved to 2.8. Baseline Cr in our records was 2.5. Initially concerned with Cr. not decreasing below this level and thought of evaluating further with [place] transplant. However, discussed with nephrologists from Dr. Geronimo Becker office and per their records, patient's baseline is 2.8. Of  note, patient was receiving 180 mg of mycophenolate BID, instead of the 540 mg BID, as there was an error in medication reconciliation. This was also communicated to Dr. Geronimo Becker colleagues. Patient is discharged with planned outpatient laboratory scheduled for Thursday 12/28, and follow up with Dr. Everrett Becker on 04/19/2022.   HTN Patient remained with SBP in the 150-160 on home doses of amlodipine, hydralazine, and carvedilol. Given Cr. of 2.8 is consistent with trend per his [facility] nephrologists, chlorthalidone, spironolactone and lasix PRN were resumed at discharge  Stable medical conditions DM with neuropathy - managed with Lantus and SSI Anemia - monitored, stable Hypothyroidism - resumed home dose of levothyroxine Gout - held allopurinol in setting of AKI  Discharge Subjective: This is hospital day 4 for this patient who was seen and evaluated at the bedside this morning. He states he is continuing to cough, and feels as though the mucus is loosening on his chest. Doing better overall. His  appetite is improved and is eating, though he continues to dislike the hospital food.  The patient denies any urinary sxs, chest pain, SOB, DOE, or bowel changes. Discussed need to increase fluid intake. Discussed need for antibiotic continuation at discharge and repeat standing blood work per his nephrology team. Patient in awareness he has  follow up appointment with Dr. Everrett Becker at [facility] in January.  Discharge Exam:   Blood pressure (!) 167/75, pulse 84, temperature 98.6 F (37 C), temperature source Oral, resp. rate 19, height '5\' 8"'$  (1.727 m), weight 100 kg, SpO2 96 %.  Constitutional: well-appearing man sitting in bed, in no acute distress HENT: normocephalic atraumatic, mucous membranes moist Cardiovascular: regular rate and rhythm, no m/r/g,  Pulmonary/Chest: normal work of breathing on room air, lungs with midly decreased sounds on LLL, clear to auscultation bilaterally otherwise. No wheezing or crackles   Abdominal: soft, non-tender, non-distended.  Neurological: alert & oriented x 3 MSK: no gross abnormalities and trace L LE edema Skin: warm and dry Psych: Normal mood and affect  Pertinent Labs, Studies, and Procedures:     Latest Ref Rng & Units 03/25/2022    4:54 AM 03/24/2022    4:18 AM 03/23/2022    3:33 AM  CBC  WBC 4.0 - 10.5 K/uL 10.4  14.6  20.7   Hemoglobin 13.0 - 17.0 g/dL 10.0  9.4  9.2   Hematocrit 39.0 - 52.0 % 34.8  32.6  30.8   Platelets 150 - 400 K/uL 345  310  348        Latest Ref Rng & Units 03/25/2022    4:54 AM 03/24/2022    4:18 AM 03/23/2022    3:33 AM  CMP  Glucose 70 - 99 mg/dL 137  129  98   BUN 6 - 20 mg/dL 57  52  47   Creatinine 0.61 - 1.24 mg/dL 2.89  2.84  2.81   Sodium 135 - 145 mmol/L 135  134  137   Potassium 3.5 - 5.1 mmol/L 4.4  4.5  4.5   Chloride 98 - 111 mmol/L 104  100  104   CO2 22 - 32 mmol/L '20  20  23   '$ Calcium 8.9 - 10.3 mg/dL 9.1  9.1  8.7   Total Protein 6.5 - 8.1 g/dL   6.1   Total Bilirubin 0.3 - 1.2 mg/dL   0.9   Alkaline Phos 38 - 126 U/L   61   AST 15 - 41 U/L   26   ALT 0 - 44 U/L   28     DG Chest 2 View  Result Date: 03/22/2022 CLINICAL DATA:  Cough and fever EXAM: CHEST - 2 VIEW COMPARISON:  None Available. FINDINGS: The heart size and mediastinal contours are within normal limits. Left lower lobe consolidation. The visualized skeletal structures are unremarkable. IMPRESSION: Left lower lobe consolidation, consistent with pneumonia. Recommend follow-up PA and lateral chest x-ray in 6-8 weeks to ensure resolution. Electronically Signed   By: Yetta Glassman M.D.   On: 03/22/2022 10:43     Discharge Instructions: Discharge Instructions     Call Becker for:  difficulty breathing, headache or visual disturbances   Complete by: As directed    Call Becker for:  extreme fatigue   Complete by: As directed    Call Becker for:  hives   Complete by: As directed    Call Becker for:  persistant dizziness or light-headedness    Complete by: As directed  Call Becker for:  persistant nausea and vomiting   Complete by: As directed    Call Becker for:  redness, tenderness, or signs of infection (pain, swelling, redness, odor or green/yellow discharge around incision site)   Complete by: As directed    Call Becker for:  severe uncontrolled pain   Complete by: As directed    Call Becker for:  temperature >100.4   Complete by: As directed        Signed: Romana Juniper, Becker [facility] - PGY1 Pager: [phone] 03/25/2022, 11:38 AM

## 2022-03-25 NOTE — Discharge Instructions (Signed)
[name], Tim Becker were admitted to the hospital because you had a fever and worsening cough after having respiratory symptoms for 3 weeks. You white blood cell count was elevated and chest XRAY showed consolidation in your left lower lung lobe. You were treated for community acquired pneumonia with IV antibiotics and your symptoms and white cell count have improved significantly. You have not had a fever in 48 hours. We are discharging you today; you will need to finish your course of antibiotics when you leave the hospital (see instructions below)  New Medications Azithromycin - take one tablet (250 mg) in the AM tomorrow Tuesday, 03/26/2022 Levofloxacin - take one 500 mg tablet in the morning on Wednesday, 03/27/2022 Guifenasin - take this medication twice daily until Thursday 03/28/2022  Please continue taking your immunosuppressants: Mycophenolate - 540 mg (3 tablets), twice a day Tacrolimus - take '6mg'$  daily before breakfast  Your kidney function tests were elevated when we admitted you to the hospital. It has improved and the creatinine is 2.8. We spoke with Tim Becker colleagues at [facility], and they mentioned this is a stable value for you. They would like you to repeat your standing kidney labs this Thursday 12/28 and follow up with Tim Becker on January 19/2023. It is important that you follow up with your kidney doctors and your primary care physician to monitor your symptoms.   Take care, Tim Juniper, MD

## 2022-03-25 NOTE — Plan of Care (Signed)
  Problem: Education: Goal: Ability to describe self-care measures that may prevent or decrease complications (Diabetes Survival Skills Education) will improve 03/25/2022 1305 by Dennie Bible I, RN Outcome: Adequate for Discharge 03/25/2022 1304 by Dennie Bible I, RN Outcome: Progressing Goal: Individualized Educational Video(s) Outcome: Adequate for Discharge   Problem: Coping: Goal: Ability to adjust to condition or change in health will improve 03/25/2022 1305 by Dennie Bible I, RN Outcome: Adequate for Discharge 03/25/2022 1304 by Dennie Bible I, RN Outcome: Progressing   Problem: Fluid Volume: Goal: Ability to maintain a balanced intake and output will improve 03/25/2022 1305 by Dennie Bible I, RN Outcome: Adequate for Discharge 03/25/2022 1304 by Dennie Bible I, RN Outcome: Progressing   Problem: Health Behavior/Discharge Planning: Goal: Ability to identify and utilize available resources and services will improve 03/25/2022 1305 by Dennie Bible I, RN Outcome: Adequate for Discharge 03/25/2022 1304 by Dennie Bible I, RN Outcome: Progressing Goal: Ability to manage health-related needs will improve 03/25/2022 1305 by Dennie Bible I, RN Outcome: Adequate for Discharge 03/25/2022 1304 by Dennie Bible I, RN Outcome: Progressing   Problem: Metabolic: Goal: Ability to maintain appropriate glucose levels will improve 03/25/2022 1305 by Dennie Bible I, RN Outcome: Adequate for Discharge 03/25/2022 1304 by Dennie Bible I, RN Outcome: Progressing   Problem: Nutritional: Goal: Maintenance of adequate nutrition will improve 03/25/2022 1305 by Dennie Bible I, RN Outcome: Adequate for Discharge 03/25/2022 1304 by Dennie Bible I, RN Outcome: Progressing Goal: Progress toward achieving an optimal weight will improve 03/25/2022 1305 by Dennie Bible I, RN Outcome: Adequate for Discharge 03/25/2022 1304 by Dennie Bible I, RN Outcome: Progressing   Problem:  Skin Integrity: Goal: Risk for impaired skin integrity will decrease 03/25/2022 1305 by Dennie Bible I, RN Outcome: Adequate for Discharge 03/25/2022 1304 by Dennie Bible I, RN Outcome: Progressing   Problem: Tissue Perfusion: Goal: Adequacy of tissue perfusion will improve 03/25/2022 1305 by Dennie Bible I, RN Outcome: Adequate for Discharge 03/25/2022 1304 by Eldridge Abrahams, RN Outcome: Progressing

## 2022-03-25 NOTE — Plan of Care (Signed)

## 2022-03-25 NOTE — TOC Transition Note (Signed)
Transition of Care [facility]) - CM/SW Discharge Note   Patient Details  Name: Tim Becker MRN: [id number] Date of Birth: [birth year]  [facility] CM/SW Contact:  Tom-Johnson, Renea Ee, RN Phone Number: 03/25/2022, 11:56 AM   Clinical Narrative:     Patient is scheduled for discharge today. No TOC needs or recommendations noted. Family to transport at discharge. No further TOC needs noted.     Final next level of care: Home/Self Care Barriers to Discharge: Barriers Resolved   Patient Goals and CMS Choice CMS Medicare.gov Compare Post Acute Care list provided to:: Patient Choice offered to / list presented to : NA  Discharge Placement                  Patient to be transferred to facility by: family      Discharge Plan and Services Additional resources added to the After Visit Summary for                  DME Arranged: N/A DME Agency: NA       HH Arranged: NA HH Agency: NA        Social Determinants of Health (SDOH) Interventions SDOH Screenings   Tobacco Use: Low Risk  (03/22/2022)     Readmission Risk Interventions     No data to display

## 2022-03-26 ENCOUNTER — Telehealth: Payer: Self-pay

## 2022-03-26 NOTE — Patient Outreach (Signed)
  Care Coordination TOC Note Transition Care Management Follow-up Telephone Call Date of discharge and from where: 03/25/22-[place] Dx: "CAP" How have you been since you were released from the hospital? Patient reports he is doing good. Denies any acute issues or concerns at present. Appetite good. Denies any SOB. He has occasional productive cough at times-taking Mucinex. Any questions or concerns? Yes-Patient voices that when wife picked up abxs yesterday from pharmacy that there was only one pill in each bottle. Verified per d/c instructions that patient only had one dose of both abx remaining to take. He voices that he took both of them this morning and therefore has completed abx therapy.  Items Reviewed: Did the pt receive and understand the discharge instructions provided? Yes  Medications obtained and verified? Yes  Other? Yes  Any new allergies since your discharge? No  Dietary orders reviewed? Yes Do you have support at home? Yes   Home Care and Equipment/Supplies: Were home health services ordered? not applicable If so, what is the name of the agency? N/A  Has the agency set up a time to come to the patient's home? not applicable Were any new equipment or medical supplies ordered?  No What is the name of the medical supply agency? N/A Were you able to get the supplies/equipment? not applicable Do you have any questions related to the use of the equipment or supplies? No  Functional Questionnaire: (I = Independent and D = Dependent) ADLs: I  Bathing/Dressing- I  Meal Prep- I  Eating- I  Maintaining continence- I  Transferring/Ambulation- I  Managing Meds- I  Follow up appointments reviewed:  PCP Hospital f/u appt confirmed? Yes  Scheduled to see Dinah Ngetich,NP on 04/26/22 @ 10:20am. [facility] f/u appt confirmed? Yes  Scheduled to see Dr. Everrett Coombe on 04/19/22. Are transportation arrangements needed? No  If their condition worsens, is the pt aware to  call PCP or go to the Emergency Dept.? Yes Was the patient provided with contact information for the PCP's office or ED? Yes Was to pt encouraged to call back with questions or concerns? Yes  SDOH assessments and interventions completed:   Yes SDOH Interventions Today    Flowsheet Row Most Recent Value  SDOH Interventions   Food Insecurity Interventions Intervention Not Indicated  Transportation Interventions Intervention Not Indicated       Care Coordination Interventions:  Education provided    Encounter Outcome:  Pt. Visit Completed    Enzo Montgomery, RN,BSN,CCM [place] Management Telephonic Care Management Coordinator Direct Phone: [phone] Toll Free: [phone] Fax: [phone]

## 2022-03-27 LAB — CULTURE, RESPIRATORY W GRAM STAIN: Culture: NORMAL

## 2022-03-27 LAB — EXPECTORATED SPUTUM ASSESSMENT W GRAM STAIN, RFLX TO RESP C

## 2022-03-27 LAB — CULTURE, BLOOD (ROUTINE X 2)
Culture: NO GROWTH
Culture: NO GROWTH
Special Requests: ADEQUATE

## 2022-03-28 ENCOUNTER — Other Ambulatory Visit (HOSPITAL_COMMUNITY)
Admission: RE | Admit: 2022-03-28 | Discharge: 2022-03-28 | Disposition: A | Discharge status: Home / Self Care | Payer: Medicare Other | Attending: Pediatric Nephrology | Admitting: Pediatric Nephrology

## 2022-03-28 DIAGNOSIS — Z09 Encounter for follow-up examination after completed treatment for conditions other than malignant neoplasm: Secondary | ICD-10-CM | POA: Insufficient documentation

## 2022-03-28 DIAGNOSIS — E559 Vitamin D deficiency, unspecified: Secondary | ICD-10-CM | POA: Diagnosis not present

## 2022-03-28 DIAGNOSIS — Z79899 Other long term (current) drug therapy: Secondary | ICD-10-CM | POA: Diagnosis not present

## 2022-03-28 DIAGNOSIS — Z789 Other specified health status: Secondary | ICD-10-CM | POA: Diagnosis not present

## 2022-03-28 DIAGNOSIS — E1129 Type 2 diabetes mellitus with other diabetic kidney complication: Secondary | ICD-10-CM | POA: Insufficient documentation

## 2022-03-28 DIAGNOSIS — D631 Anemia in chronic kidney disease: Secondary | ICD-10-CM | POA: Diagnosis not present

## 2022-03-28 DIAGNOSIS — T8610 Unspecified complication of kidney transplant: Secondary | ICD-10-CM | POA: Insufficient documentation

## 2022-03-28 DIAGNOSIS — N189 Chronic kidney disease, unspecified: Secondary | ICD-10-CM | POA: Diagnosis not present

## 2022-03-28 DIAGNOSIS — Z114 Encounter for screening for human immunodeficiency virus [HIV]: Secondary | ICD-10-CM | POA: Diagnosis not present

## 2022-03-28 DIAGNOSIS — D899 Disorder involving the immune mechanism, unspecified: Secondary | ICD-10-CM | POA: Insufficient documentation

## 2022-03-28 DIAGNOSIS — B259 Cytomegaloviral disease, unspecified: Secondary | ICD-10-CM | POA: Diagnosis not present

## 2022-03-28 DIAGNOSIS — N390 Urinary tract infection, site not specified: Secondary | ICD-10-CM | POA: Insufficient documentation

## 2022-03-28 DIAGNOSIS — Z9483 Pancreas transplant status: Secondary | ICD-10-CM | POA: Diagnosis not present

## 2022-03-28 LAB — CBC WITH DIFFERENTIAL/PLATELET
Abs Immature Granulocytes: 0.29 10*3/uL — ABNORMAL HIGH (ref 0.00–0.07)
Basophils Absolute: 0.1 10*3/uL (ref 0.0–0.1)
Basophils Relative: 1 %
Eosinophils Absolute: 0.2 10*3/uL (ref 0.0–0.5)
Eosinophils Relative: 2 %
HCT: 34.8 % — ABNORMAL LOW (ref 39.0–52.0)
Hemoglobin: 10 g/dL — ABNORMAL LOW (ref 13.0–17.0)
Immature Granulocytes: 3 %
Lymphocytes Relative: 5 %
Lymphs Abs: 0.5 10*3/uL — ABNORMAL LOW (ref 0.7–4.0)
MCH: 24.3 pg — ABNORMAL LOW (ref 26.0–34.0)
MCHC: 28.7 g/dL — ABNORMAL LOW (ref 30.0–36.0)
MCV: 84.5 fL (ref 80.0–100.0)
Monocytes Absolute: 0.9 10*3/uL (ref 0.1–1.0)
Monocytes Relative: 9 %
Neutro Abs: 7.7 10*3/uL (ref 1.7–7.7)
Neutrophils Relative %: 80 %
Platelets: 303 10*3/uL (ref 150–400)
RBC: 4.12 MIL/uL — ABNORMAL LOW (ref 4.22–5.81)
RDW: 13.7 % (ref 11.5–15.5)
WBC: 9.6 10*3/uL (ref 4.0–10.5)
nRBC: 0 % (ref 0.0–0.2)

## 2022-03-28 LAB — BASIC METABOLIC PANEL
Anion gap: 8 (ref 5–15)
BUN: 51 mg/dL — ABNORMAL HIGH (ref 6–20)
CO2: 23 mmol/L (ref 22–32)
Calcium: 9.1 mg/dL (ref 8.9–10.3)
Chloride: 107 mmol/L (ref 98–111)
Creatinine, Ser: 2.33 mg/dL — ABNORMAL HIGH (ref 0.61–1.24)
GFR, Estimated: 32 mL/min — ABNORMAL LOW (ref >60)
Glucose, Bld: 150 mg/dL — ABNORMAL HIGH (ref 70–99)
Potassium: 5 mmol/L (ref 3.5–5.1)
Sodium: 138 mmol/L (ref 135–145)

## 2022-03-28 LAB — PHOSPHORUS: Phosphorus: 3.5 mg/dL (ref 2.5–4.6)

## 2022-03-28 LAB — MAGNESIUM: Magnesium: 2 mg/dL (ref 1.7–2.4)

## 2022-03-30 LAB — TACROLIMUS LEVEL: Tacrolimus (FK506) - LabCorp: 5.1 ng/mL (ref 2.0–20.0)

## 2022-03-30 MED ORDER — AMLODIPINE 10 MG TABLET
ORAL_TABLET | Freq: Every day | ORAL | 11 refills | 30 days
Start: 2022-03-30 — End: 2023-03-30

## 2022-03-30 MED ORDER — CARVEDILOL 12.5 MG TABLET
ORAL_TABLET | Freq: Two times a day (BID) | ORAL | 11 refills | 30 days
Start: 2022-03-30 — End: 2023-03-25

## 2022-03-30 MED ORDER — ERGOCALCIFEROL (VITAMIN D2) 1,250 MCG (50,000 UNIT) CAPSULE
ORAL_CAPSULE | ORAL | 0 refills | 84 days | Status: CP
Start: 2022-03-30 — End: 2022-06-28
  Filled 2022-04-04: qty 12, 84d supply, fill #0

## 2022-04-02 MED ORDER — CARVEDILOL 12.5 MG TABLET
ORAL_TABLET | Freq: Two times a day (BID) | ORAL | 11 refills | 30 days | Status: CP
Start: 2022-04-02 — End: 2023-03-28
  Filled 2022-04-04: qty 540, 90d supply, fill #0

## 2022-04-02 MED ORDER — AMLODIPINE 10 MG TABLET
ORAL_TABLET | Freq: Every day | ORAL | 11 refills | 30 days | Status: CP
Start: 2022-04-02 — End: 2023-04-02
  Filled 2022-04-04: qty 90, 90d supply, fill #0

## 2022-04-04 MED FILL — ALLOPURINOL 100 MG TABLET: ORAL | 90 days supply | Qty: 45 | Fill #1

## 2022-04-04 MED FILL — CHLORTHALIDONE 25 MG TABLET: ORAL | 90 days supply | Qty: 180 | Fill #1

## 2022-04-13 DIAGNOSIS — M792 Neuralgia and neuritis, unspecified: Principal | ICD-10-CM

## 2022-04-13 MED ORDER — GABAPENTIN 100 MG CAPSULE
ORAL_CAPSULE | Freq: Two times a day (BID) | ORAL | 0 refills | 30 days
Start: 2022-04-13 — End: 2022-05-13

## 2022-04-15 MED FILL — GABAPENTIN 100 MG CAPSULE: ORAL | 30 days supply | Qty: 60 | Fill #2

## 2022-04-15 MED FILL — MYCOPHENOLATE SODIUM 180 MG TABLET,DELAYED RELEASE: ORAL | 30 days supply | Qty: 180 | Fill #3

## 2022-04-16 DIAGNOSIS — R82998 Other abnormal findings in urine: Principal | ICD-10-CM

## 2022-04-16 DIAGNOSIS — Z940 Kidney transplant status: Principal | ICD-10-CM

## 2022-04-19 ENCOUNTER — Ambulatory Visit: Admit: 2022-04-19 | Discharge: 2022-04-19 | Payer: MEDICARE

## 2022-04-19 DIAGNOSIS — I151 Hypertension secondary to other renal disorders: Principal | ICD-10-CM

## 2022-04-19 DIAGNOSIS — Z940 Kidney transplant status: Principal | ICD-10-CM

## 2022-04-19 DIAGNOSIS — E039 Hypothyroidism, unspecified: Principal | ICD-10-CM

## 2022-04-19 DIAGNOSIS — R82998 Other abnormal findings in urine: Principal | ICD-10-CM

## 2022-04-19 DIAGNOSIS — Z79899 Other long term (current) drug therapy: Principal | ICD-10-CM

## 2022-04-19 DIAGNOSIS — Z23 Encounter for immunization: Principal | ICD-10-CM

## 2022-04-19 DIAGNOSIS — E790 Hyperuricemia without signs of inflammatory arthritis and tophaceous disease: Secondary | ICD-10-CM | POA: Diagnosis not present

## 2022-04-19 MED ORDER — SPIRONOLACTONE 25 MG TABLET
ORAL_TABLET | Freq: Every day | ORAL | 3 refills | 90 days | Status: CP
Start: 2022-04-19 — End: 2023-04-19
  Filled 2022-04-22: qty 180, 90d supply, fill #0

## 2022-04-22 MED FILL — HYDRALAZINE 25 MG TABLET: ORAL | 90 days supply | Qty: 540 | Fill #1

## 2022-04-26 ENCOUNTER — Ambulatory Visit (INDEPENDENT_AMBULATORY_CARE_PROVIDER_SITE_OTHER): Payer: 59 | Admitting: Family

## 2022-04-26 ENCOUNTER — Encounter: Payer: Self-pay | Admitting: Family

## 2022-04-26 VITALS — BP 126/66 | HR 75 | Temp 97.7°F | Resp 18 | Ht 68.0 in | Wt 229.8 lb

## 2022-04-26 DIAGNOSIS — E1140 Type 2 diabetes mellitus with diabetic neuropathy, unspecified: Secondary | ICD-10-CM

## 2022-04-26 DIAGNOSIS — D631 Anemia in chronic kidney disease: Secondary | ICD-10-CM | POA: Diagnosis not present

## 2022-04-26 DIAGNOSIS — R040 Epistaxis: Secondary | ICD-10-CM

## 2022-04-26 DIAGNOSIS — E785 Hyperlipidemia, unspecified: Secondary | ICD-10-CM | POA: Diagnosis not present

## 2022-04-26 DIAGNOSIS — Z794 Long term (current) use of insulin: Secondary | ICD-10-CM

## 2022-04-26 DIAGNOSIS — N189 Chronic kidney disease, unspecified: Secondary | ICD-10-CM | POA: Diagnosis not present

## 2022-04-26 DIAGNOSIS — E039 Hypothyroidism, unspecified: Secondary | ICD-10-CM

## 2022-04-26 NOTE — Progress Notes (Unsigned)
Provider: Marlowe Sax FNP-C   Jahfari Ambers, Nelda Bucks, NP  Patient Care Team: Mattheu Brodersen, Nelda Bucks, NP as PCP - General (Family Medicine) Buford Dresser, MD as PCP - Cardiology (Cardiology) Newt Minion, MD as Consulting Physician (Orthopedic Surgery) Edrick Kins, DPM as Consulting Physician (Podiatry)  Extended Emergency Contact Information Primary Emergency Contact: [name],[name] Address: [address], [place] [address] Johnnette Litter of [place] Phone: [phone] Mobile Phone: [phone] Relation: Spouse  Code Status:  Full Code  Goals of care: Advanced Directive information    04/26/2022   10:22 AM  Advanced Directives  Does Patient Have a Medical Advance Directive? No  Does patient want to make changes to medical advance directive? No - Patient declined     Chief Complaint  Patient presents with   Medical Management of Chronic Issues    6 month follow up.    Health Maintenance    Discuss the need for Foot exam, AWV, Eye exam, Hemoglobin A1C, and Colonoscopy.    Immunizations    Discuss the need for Shingrix vaccine.     HPI:  Pt is a 57 y.o. male seen today for 6 months follow up for medical management of chronic diseases.   States had Pneumonia in November,2023.states has recovered well. Follows up with Nephrology at [facility] last seen 04/19/2022 lab work done. CR 2.45,BUN 53 GFR 30  He was also seen by Insurance NP Hgb A1C 6.7 no home blood sugars for review.  Due for annual foot exam.  Has had annual eye exam.  States eats 2 meals per day. Has had 3lbs weight loss since last visit.  Past Medical History:  Diagnosis Date   Anemia    Arthritis    Cancer ([place])    Renal Tumor   CKD (chronic kidney disease)    Coronary artery disease    Diabetes ([place])    DM (diabetes mellitus) with complications ([place])    ESRD (end stage renal disease) ([place])    Pre- dialysis   History of colonoscopy 2011   Hypertension    Kidney  transplanted 2022   Left kidney mass    Neuropathy    Obesity    Osteomyelitis, chronic, ankle or foot (HCC)    PONV (postoperative nausea and vomiting)    Renal insufficiency    Patient is on Dialysis and receives M,W and F.   S/P BKA (below knee amputation) ([place]) 08/2014   Right , Rondall Allegra, [place]   Sleep apnea    CPAP   Thyroid disease    Past Surgical History:  Procedure Laterality Date   BASCILIC VEIN TRANSPOSITION Left 02/17/2015   Procedure: BASCILIC VEIN TRANSPOSITION;  Surgeon: Katha Cabal, MD;  Location: [facility];  Service: Vascular;  Laterality: Left;   BELOW KNEE LEG AMPUTATION Right    CORONARY ANGIOPLASTY WITH STENT PLACEMENT     EYE SURGERY     FOOT SURGERY     Multiple R foot surgery for infection and charcot foot   I & D EXTREMITY Left 04/16/2016   Procedure: IRRIGATION AND DEBRIDEMENT EXTREMITY/GREAT TOE AMP.;  Surgeon: Edrick Kins, DPM;  Location: [facility];  Service: Podiatry;  Laterality: Left;   JOINT REPLACEMENT     LAPAROSCOPIC NEPHRECTOMY, HAND ASSISTED Left 07/11/2015   Procedure: HAND ASSISTED LAPAROSCOPIC NEPHRECTOMY;  Surgeon: Hollice Espy, MD;  Location: [facility];  Service: Urology;  Laterality: Left;   PERIPHERAL VASCULAR CATHETERIZATION Left  12/06/2014   Procedure: A/V Shuntogram/Fistulagram;  Surgeon: Katha Cabal, MD;  Location: [facility];  Service: Cardiovascular;  Laterality: Left;   PERIPHERAL VASCULAR CATHETERIZATION Left 12/06/2014   Procedure: A/V Shunt Intervention;  Surgeon: Katha Cabal, MD;  Location: [facility];  Service: Cardiovascular;  Laterality: Left;   TONSILLECTOMY      Allergies  Allergen Reactions   Ferric Citrate Nausea And Vomiting    Allergies as of 04/26/2022       Reactions   Ferric Citrate Nausea And Vomiting        Medication List        Accurate as of April 26, 2022 10:41 AM. If you have any questions, ask your nurse or doctor.          Accu-Chek Softclix Lancets  lancets Test blood sugar 3 times a day. DX: E11.40   acetaminophen 500 MG tablet Commonly known as: TYLENOL Take 500 mg by mouth every 6 (six) hours as needed for moderate pain or mild pain.   allopurinol 100 MG tablet Commonly known as: ZYLOPRIM Take 100 mg by mouth every other day.   amLODipine 10 MG tablet Commonly known as: NORVASC Take 10 mg by mouth in the morning. 9am   aspirin EC 81 MG tablet Take 81 mg by mouth daily. Swallow whole.   atorvastatin 40 MG tablet Commonly known as: LIPITOR Take 40 mg by mouth daily. 9pm.   BD Pen Needle Nano 2nd Gen 32G X 4 MM Misc Generic drug: Insulin Pen Needle Use to test blood sugar four times daily. Dx:E11.40   carvedilol 12.5 MG tablet Commonly known as: COREG Take 37.5 mg by mouth in the morning, at noon, and at bedtime. 9am , and 9pm   chlorthalidone 25 MG tablet Commonly known as: HYGROTON Take 25 mg by mouth daily.   ergocalciferol 1.25 MG (50000 UT) capsule Commonly known as: VITAMIN D2 Take 50,000 Units by mouth once a week.   furosemide 80 MG tablet Commonly known as: LASIX Take 80 mg by mouth as needed for edema or fluid.   glucose blood test strip 3 times daily DX: E11.40   hydrALAZINE 25 MG tablet Commonly known as: APRESOLINE Take 75 mg by mouth 3 (three) times daily.   insulin glargine 100 unit/mL Sopn Commonly known as: LANTUS Inject 35 Units into the skin daily before breakfast.   insulin lispro 100 UNIT/ML injection Commonly known as: HUMALOG Inject 3-17 Units into the skin 2 (two) times daily as needed for high blood sugar.   Iron (Ferrous Sulfate) 325 (65 Fe) MG Tabs Take 325 mg by mouth daily.   Jardiance 10 MG Tabs tablet Generic drug: empagliflozin Take 10 mg by mouth daily.   levothyroxine 75 MCG tablet Commonly known as: SYNTHROID Take 75 mcg by mouth daily before breakfast. 7am   mycophenolate 180 MG EC tablet Commonly known as: MYFORTIC Take 3 tablets (540 mg total) by mouth in  the morning and at bedtime. 3 tablets (540 mg total per dose) at 9am and 9pm   Ozempic (1 MG/DOSE) 2 MG/1.5ML Sopn Generic drug: Semaglutide (1 MG/DOSE) Inject 1 mg into the skin once a week. 9am   spironolactone 25 MG tablet Commonly known as: ALDACTONE Take 50 mg by mouth every morning.   tacrolimus ER 1 MG Tb24 Commonly known as: ENVARSUS XR Take 6 mg by mouth daily before breakfast.        Review of Systems  Constitutional:  Negative for  appetite change, chills, fatigue, fever and unexpected weight change.  HENT:  Negative for congestion, ear discharge, ear pain, hearing loss, nosebleeds, postnasal drip, rhinorrhea, sinus pressure, sinus pain, sneezing, sore throat, tinnitus and trouble swallowing.   Eyes:  Negative for pain, discharge, redness, itching and visual disturbance.  Respiratory:  Negative for cough, chest tightness, shortness of breath and wheezing.   Cardiovascular:  Negative for chest pain, palpitations and leg swelling.  Gastrointestinal:  Negative for abdominal distention, abdominal pain, blood in stool, constipation, diarrhea, nausea and vomiting.  Endocrine: Negative for cold intolerance, heat intolerance, polydipsia, polyphagia and polyuria.  Genitourinary:  Negative for difficulty urinating, dysuria, flank pain, frequency and urgency.  Musculoskeletal:  Negative for arthralgias, back pain, gait problem, joint swelling, myalgias, neck pain and neck stiffness.       Right BKA   Skin:  Negative for color change, pallor, rash and wound.  Neurological:  Negative for dizziness, syncope, speech difficulty, weakness, light-headedness, numbness and headaches.  Hematological:  Does not bruise/bleed easily.  Psychiatric/Behavioral:  Negative for agitation, behavioral problems, confusion, hallucinations, self-injury, sleep disturbance and suicidal ideas. The patient is not nervous/anxious.     Immunization History  Administered Date(s) Administered   Covid-19,  Mrna,Vaccine(Spikevax)70yr and older 04/19/2022   Hepatitis B, adult 12/30/2014, 01/27/2015, 03/01/2015, 06/30/2015   Influenza Split 07/31/2011, 01/31/2020   Influenza,inj,Quad PF,6+ Mos 01/19/2020, 12/27/2020, 04/26/2021, 01/16/2022   Influenza-Unspecified 01/19/2020   PFIZER(Purple Top)SARS-COV-2 Vaccination 06/11/2019, 07/07/2019, 12/31/2019, 01/31/2020, 08/31/2020, 10/11/2020   PNEUMOCOCCAL CONJUGATE-20 01/16/2022   Pfizer Covid-19 Vaccine Bivalent Booster 139yr& up 04/26/2021   Pneumococcal Polysaccharide-23 05/22/2015   Tdap 03/01/2017   Pertinent  Health Maintenance Due  Topic Date Due   FOOT EXAM  Never done   OPHTHALMOLOGY EXAM  Never done   COLONOSCOPY (Pts 45-4965yrnsurance coverage will need to be confirmed)  01/06/2020   HEMOGLOBIN A1C  04/21/2022   INFLUENZA VACCINE  Completed      03/23/2022    8:27 AM 03/23/2022    8:00 PM 03/24/2022    9:00 AM 03/24/2022    9:57 PM 04/26/2022   10:22 AM  Fall Risk  Falls in the past year?     0  Was there an injury with Fall?     0  Fall Risk Category Calculator     0  (RETIRED) Patient Fall Risk Level Moderate fall risk Moderate fall risk High fall risk High fall risk   Patient at Risk for Falls Due to     No Fall Risks  Fall risk Follow up     Falls evaluation completed   Functional Status Survey:    Vitals:   04/26/22 1018  BP: 126/66  Pulse: 75  Resp: 18  Temp: 97.7 F (36.5 C)  SpO2: 98%  Weight: 229 lb 12.8 oz (104.2 kg)  Height: '5\' 8"'$  (1.727 m)   Body mass index is 34.94 kg/m. Physical Exam Vitals reviewed.  Constitutional:      General: He is not in acute distress.    Appearance: Normal appearance. He is obese. He is not ill-appearing or diaphoretic.  HENT:     Head: Normocephalic.     Right Ear: Tympanic membrane, ear canal and external ear normal. There is no impacted cerumen.     Left Ear: Tympanic membrane, ear canal and external ear normal. There is no impacted cerumen.     Nose: Nose normal.  No congestion or rhinorrhea.     Mouth/Throat:     Mouth: Mucous  membranes are moist.     Pharynx: Oropharynx is clear. No oropharyngeal exudate or posterior oropharyngeal erythema.  Eyes:     General: No scleral icterus.       Right eye: No discharge.        Left eye: No discharge.     Extraocular Movements: Extraocular movements intact.     Conjunctiva/sclera: Conjunctivae normal.     Pupils: Pupils are equal, round, and reactive to light.  Neck:     Vascular: No carotid bruit.  Cardiovascular:     Rate and Rhythm: Normal rate and regular rhythm.     Pulses: Normal pulses.           Right dorsalis pedis pulse not accessible.        Right posterior tibial pulse not accessible.     Heart sounds: Normal heart sounds. No murmur heard.    No friction rub. No gallop.     Comments: Right BKA  Pulmonary:     Effort: Pulmonary effort is normal. No respiratory distress.     Breath sounds: Normal breath sounds. No wheezing, rhonchi or rales.  Chest:     Chest wall: No tenderness.  Abdominal:     General: Bowel sounds are normal. There is no distension.     Palpations: Abdomen is soft. There is no mass.     Tenderness: There is no abdominal tenderness. There is no right CVA tenderness, left CVA tenderness, guarding or rebound.  Musculoskeletal:        General: No swelling or tenderness. Normal range of motion.     Cervical back: Normal range of motion. No rigidity or tenderness.     Left lower leg: No edema.     Right Lower Extremity: Right leg is amputated below knee.  Lymphadenopathy:     Cervical: No cervical adenopathy.  Skin:    General: Skin is warm and dry.     Coloration: Skin is not pale.     Findings: No bruising, erythema, lesion or rash.  Neurological:     Mental Status: He is alert and oriented to person, place, and time.     Cranial Nerves: No cranial nerve deficit.     Sensory: No sensory deficit.     Motor: No weakness.     Coordination: Coordination normal.      Gait: Gait abnormal.  Psychiatric:        Mood and Affect: Mood normal.        Speech: Speech normal.        Behavior: Behavior normal.        Thought Content: Thought content normal.        Judgment: Judgment normal.     Labs reviewed: Recent Labs    11/30/21 0820 02/07/22 0830 03/22/22 1006 03/24/22 0418 03/25/22 0454 03/28/22 0846  NA 139 141   < > 134* 135 138  K 4.1 4.4   < > 4.5 4.4 5.0  CL 112* 106   < > 100 104 107  CO2 23 25   < > 20* 20* 23  GLUCOSE 148* 160*   < > 129* 137* 150*  BUN 44* 75*   < > 52* 57* 51*  CREATININE 2.64* 2.87*   < > 2.84* 2.89* 2.33*  CALCIUM 8.4* 9.2   < > 9.1 9.1 9.1  MG 1.7 2.1  --   --   --  2.0  PHOS 3.1 4.8*  --   --   --  3.5   < > =  values in this interval not displayed.   Recent Labs    10/03/21 0000 03/22/22 1006 03/23/22 0333  AST '14 27 26  '$ ALT 7* 29 28  ALKPHOS 120 67 61  BILITOT  --  0.9 0.9  PROT  --  6.6 6.1*  ALBUMIN 4.1 3.3* 2.8*   Recent Labs    02/07/22 0830 03/22/22 1006 03/23/22 0333 03/24/22 0418 03/25/22 0454 03/28/22 0846  WBC 7.9 23.4*   < > 14.6* 10.4 9.6  NEUTROABS 6.5 19.6*  --   --   --  7.7  HGB 11.7* 10.2*   < > 9.4* 10.0* 10.0*  HCT 39.9 34.9*   < > 32.6* 34.8* 34.8*  MCV 84.9 84.1   < > 84.2 84.1 84.5  PLT 200 371   < > 310 345 303   < > = values in this interval not displayed.   Lab Results  Component Value Date   TSH 1.44 10/19/2021   Lab Results  Component Value Date   HGBA1C 6.3 (H) 10/19/2021   Lab Results  Component Value Date   CHOL 59 10/19/2021   HDL 25 (L) 10/19/2021   LDLCALC 16 10/19/2021   TRIG 99 10/19/2021   CHOLHDL 2.4 10/19/2021    Significant Diagnostic Results in last 30 days:  No results found.  Assessment/Plan 1. Type 2 diabetes mellitus with diabetic neuropathy, with long-term current use of insulin (HCC) Lab Results  Component Value Date   HGBA1C 6.3 (H) 10/19/2021  Well controlled no home CBG for review. Continue on Humalog, Lantus and  Jardiance  2. Hyperlipidemia LDL goal <100 LDL at goal Continue on atorvastatin Also continue with dietary modification  3. Acquired hypothyroidism Lab Results  Component Value Date   TSH 1.44 10/19/2021  -Continue on levothyroxine 75 mcg daily on empty stomach  4. Anemia associated with chronic renal failure Hemoglobin stable Continue on ferrous sulfate Bowels regular no issues with constipation recommend stool softener in case of any constipation.  5. Bleeding nose Occasional nosebleed Has seen ENT recommended scheduling appointment with the ENT if bleeding worsens.  Family/ staff Communication: Reviewed plan of care with patient verbalized understanding  Labs/tests ordered: None   Next Appointment : Return in about 6 months (around 10/25/2022) for medical mangement of chronic issues.Sandrea Hughs, NP

## 2022-04-29 DIAGNOSIS — Z940 Kidney transplant status: Principal | ICD-10-CM

## 2022-04-29 MED ORDER — TACROLIMUS XR 1 MG TABLET,EXTENDED RELEASE 24 HR
ORAL_TABLET | Freq: Every day | ORAL | 3 refills | 90 days | Status: CP
Start: 2022-04-29 — End: 2023-04-29

## 2022-04-30 DIAGNOSIS — Z940 Kidney transplant status: Principal | ICD-10-CM

## 2022-04-30 MED ORDER — TACROLIMUS XR 1 MG TABLET,EXTENDED RELEASE 24 HR
ORAL_TABLET | Freq: Every day | ORAL | 3 refills | 90 days | Status: CP
Start: 2022-04-30 — End: 2023-04-30

## 2022-05-04 DIAGNOSIS — M792 Neuralgia and neuritis, unspecified: Principal | ICD-10-CM

## 2022-05-04 MED ORDER — GABAPENTIN 100 MG CAPSULE
ORAL_CAPSULE | Freq: Two times a day (BID) | ORAL | 0 refills | 30 days
Start: 2022-05-04 — End: 2022-06-03

## 2022-05-04 MED ORDER — ATORVASTATIN 40 MG TABLET
ORAL_TABLET | Freq: Every day | ORAL | 3 refills | 90 days
Start: 2022-05-04 — End: 2023-05-04

## 2022-05-06 MED ORDER — ATORVASTATIN 40 MG TABLET
ORAL_TABLET | Freq: Every day | ORAL | 3 refills | 90 days | Status: CP
Start: 2022-05-06 — End: 2023-05-06
  Filled 2022-05-13: qty 90, 90d supply, fill #0

## 2022-05-07 ENCOUNTER — Other Ambulatory Visit: Payer: Self-pay | Admitting: *Deleted

## 2022-05-07 ENCOUNTER — Other Ambulatory Visit (HOSPITAL_COMMUNITY)
Admission: RE | Admit: 2022-05-07 | Discharge: 2022-05-07 | Disposition: A | Discharge status: Home / Self Care | Payer: 59 | Source: Ambulatory Visit | Attending: Pediatric Nephrology | Admitting: Pediatric Nephrology

## 2022-05-07 DIAGNOSIS — E1129 Type 2 diabetes mellitus with other diabetic kidney complication: Secondary | ICD-10-CM | POA: Diagnosis not present

## 2022-05-07 DIAGNOSIS — D899 Disorder involving the immune mechanism, unspecified: Secondary | ICD-10-CM | POA: Insufficient documentation

## 2022-05-07 DIAGNOSIS — Z114 Encounter for screening for human immunodeficiency virus [HIV]: Secondary | ICD-10-CM | POA: Insufficient documentation

## 2022-05-07 DIAGNOSIS — E1140 Type 2 diabetes mellitus with diabetic neuropathy, unspecified: Secondary | ICD-10-CM

## 2022-05-07 DIAGNOSIS — B259 Cytomegaloviral disease, unspecified: Secondary | ICD-10-CM | POA: Diagnosis not present

## 2022-05-07 DIAGNOSIS — N390 Urinary tract infection, site not specified: Secondary | ICD-10-CM | POA: Diagnosis not present

## 2022-05-07 DIAGNOSIS — D631 Anemia in chronic kidney disease: Secondary | ICD-10-CM | POA: Diagnosis not present

## 2022-05-07 DIAGNOSIS — E559 Vitamin D deficiency, unspecified: Secondary | ICD-10-CM | POA: Insufficient documentation

## 2022-05-07 DIAGNOSIS — Z940 Kidney transplant status: Secondary | ICD-10-CM | POA: Diagnosis not present

## 2022-05-07 DIAGNOSIS — Z09 Encounter for follow-up examination after completed treatment for conditions other than malignant neoplasm: Secondary | ICD-10-CM | POA: Diagnosis not present

## 2022-05-07 DIAGNOSIS — Z79899 Other long term (current) drug therapy: Secondary | ICD-10-CM | POA: Insufficient documentation

## 2022-05-07 DIAGNOSIS — Z9483 Pancreas transplant status: Secondary | ICD-10-CM | POA: Insufficient documentation

## 2022-05-07 DIAGNOSIS — Z789 Other specified health status: Secondary | ICD-10-CM | POA: Diagnosis not present

## 2022-05-07 LAB — MAGNESIUM: Magnesium: 2 mg/dL (ref 1.7–2.4)

## 2022-05-07 LAB — CBC WITH DIFFERENTIAL/PLATELET
Abs Immature Granulocytes: 0.08 10*3/uL — ABNORMAL HIGH (ref 0.00–0.07)
Basophils Absolute: 0 10*3/uL (ref 0.0–0.1)
Basophils Relative: 1 %
Eosinophils Absolute: 0.1 10*3/uL (ref 0.0–0.5)
Eosinophils Relative: 2 %
HCT: 39.9 % (ref 39.0–52.0)
Hemoglobin: 11.6 g/dL — ABNORMAL LOW (ref 13.0–17.0)
Immature Granulocytes: 2 %
Lymphocytes Relative: 13 %
Lymphs Abs: 0.7 10*3/uL (ref 0.7–4.0)
MCH: 24.5 pg — ABNORMAL LOW (ref 26.0–34.0)
MCHC: 29.1 g/dL — ABNORMAL LOW (ref 30.0–36.0)
MCV: 84.2 fL (ref 80.0–100.0)
Monocytes Absolute: 0.7 10*3/uL (ref 0.1–1.0)
Monocytes Relative: 13 %
Neutro Abs: 3.7 10*3/uL (ref 1.7–7.7)
Neutrophils Relative %: 69 %
Platelets: 205 10*3/uL (ref 150–400)
RBC: 4.74 MIL/uL (ref 4.22–5.81)
RDW: 14.8 % (ref 11.5–15.5)
WBC: 5.3 10*3/uL (ref 4.0–10.5)
nRBC: 0 % (ref 0.0–0.2)

## 2022-05-07 LAB — BASIC METABOLIC PANEL
Anion gap: 10 (ref 5–15)
BUN: 62 mg/dL — ABNORMAL HIGH (ref 6–20)
CO2: 23 mmol/L (ref 22–32)
Calcium: 9.2 mg/dL (ref 8.9–10.3)
Chloride: 107 mmol/L (ref 98–111)
Creatinine, Ser: 2.5 mg/dL — ABNORMAL HIGH (ref 0.61–1.24)
GFR, Estimated: 29 mL/min — ABNORMAL LOW (ref >60)
Glucose, Bld: 144 mg/dL — ABNORMAL HIGH (ref 70–99)
Potassium: 4.4 mmol/L (ref 3.5–5.1)
Sodium: 140 mmol/L (ref 135–145)

## 2022-05-07 LAB — PHOSPHORUS: Phosphorus: 4.8 mg/dL — ABNORMAL HIGH (ref 2.5–4.6)

## 2022-05-07 MED ORDER — INSULIN LISPRO 100 UNIT/ML IJ SOLN: 3.0000 [IU] | Freq: Two times a day (BID) | INTRAMUSCULAR | 5 refills | Status: DC | PRN

## 2022-05-07 NOTE — Telephone Encounter (Signed)
Patient walked into office requesting refill.   Pended Rx and sent to [facility] for approval.

## 2022-05-07 NOTE — Telephone Encounter (Signed)
Humalog refilled.

## 2022-05-08 ENCOUNTER — Other Ambulatory Visit: Payer: Self-pay

## 2022-05-08 DIAGNOSIS — E1140 Type 2 diabetes mellitus with diabetic neuropathy, unspecified: Secondary | ICD-10-CM

## 2022-05-08 MED ORDER — INSULIN LISPRO (1 UNIT DIAL) 100 UNIT/ML (KWIKPEN): PEN_INJECTOR | SUBCUTANEOUS | 11 refills | Status: DC

## 2022-05-08 MED ORDER — HUMALOG JUNIOR KWIKPEN 100 UNIT/ML ~~LOC~~ SOPN: PEN_INJECTOR | SUBCUTANEOUS | 3 refills | Status: DC

## 2022-05-08 NOTE — Addendum Note (Signed)
Addended by: Logan Bores on: 05/08/2022 02:38 PM   Modules accepted: Orders

## 2022-05-08 NOTE — Telephone Encounter (Signed)
Incoming fax received from patients pharmacy stating rx was suppose to be for humalog pen.

## 2022-05-08 NOTE — Progress Notes (Signed)
Message received via fax from the pharmacy stating patient would like the [place] and not the JR pen

## 2022-05-09 LAB — TACROLIMUS LEVEL: Tacrolimus (FK506) - LabCorp: 6.6 ng/mL (ref 2.0–20.0)

## 2022-05-13 MED FILL — GABAPENTIN 100 MG CAPSULE: ORAL | 30 days supply | Qty: 60 | Fill #3

## 2022-05-13 MED FILL — MYCOPHENOLATE SODIUM 180 MG TABLET,DELAYED RELEASE: ORAL | 30 days supply | Qty: 180 | Fill #4

## 2022-05-13 MED FILL — LEVOTHYROXINE 75 MCG TABLET: ORAL | 90 days supply | Qty: 90 | Fill #1

## 2022-05-22 ENCOUNTER — Other Ambulatory Visit: Payer: Self-pay

## 2022-05-22 DIAGNOSIS — Z111 Encounter for screening for respiratory tuberculosis: Secondary | ICD-10-CM

## 2022-05-23 ENCOUNTER — Other Ambulatory Visit: Payer: Self-pay | Admitting: *Deleted

## 2022-05-23 ENCOUNTER — Other Ambulatory Visit: Payer: 59

## 2022-05-23 MED ORDER — ACCU-CHEK AVIVA PLUS W/DEVICE KIT: PACK | 0 refills | Status: DC

## 2022-05-23 MED ORDER — ACCU-CHEK SOFTCLIX LANCETS MISC: 3 refills | Status: DC

## 2022-05-23 MED ORDER — ACCU-CHEK AVIVA PLUS VI STRP: ORAL_STRIP | 1 refills | Status: DC

## 2022-05-23 NOTE — Telephone Encounter (Signed)
Patient walked into office requesting a Rx for a New Blood Sugar Monitor to be sent to his pharmacy.   New Monitor Rx sent as requested.

## 2022-05-25 LAB — QUANTIFERON-TB GOLD PLUS
Mitogen-NIL: 9.31 IU/mL
NIL: 0.03 IU/mL
QuantiFERON-TB Gold Plus: NEGATIVE
TB1-NIL: 0 IU/mL
TB2-NIL: 0 IU/mL

## 2022-06-07 ENCOUNTER — Other Ambulatory Visit (HOSPITAL_COMMUNITY)
Admission: RE | Admit: 2022-06-07 | Discharge: 2022-06-07 | Disposition: A | Discharge status: Home / Self Care | Payer: 59 | Source: Ambulatory Visit | Attending: Pediatric Nephrology | Admitting: Pediatric Nephrology

## 2022-06-07 DIAGNOSIS — Z79899 Other long term (current) drug therapy: Secondary | ICD-10-CM | POA: Insufficient documentation

## 2022-06-07 DIAGNOSIS — D631 Anemia in chronic kidney disease: Secondary | ICD-10-CM | POA: Insufficient documentation

## 2022-06-07 DIAGNOSIS — T8610 Unspecified complication of kidney transplant: Secondary | ICD-10-CM | POA: Diagnosis not present

## 2022-06-07 DIAGNOSIS — Z114 Encounter for screening for human immunodeficiency virus [HIV]: Secondary | ICD-10-CM | POA: Diagnosis not present

## 2022-06-07 DIAGNOSIS — D899 Disorder involving the immune mechanism, unspecified: Secondary | ICD-10-CM | POA: Diagnosis not present

## 2022-06-07 DIAGNOSIS — Z940 Kidney transplant status: Secondary | ICD-10-CM | POA: Insufficient documentation

## 2022-06-07 DIAGNOSIS — Z789 Other specified health status: Secondary | ICD-10-CM | POA: Diagnosis not present

## 2022-06-07 DIAGNOSIS — B259 Cytomegaloviral disease, unspecified: Secondary | ICD-10-CM | POA: Insufficient documentation

## 2022-06-07 DIAGNOSIS — N390 Urinary tract infection, site not specified: Secondary | ICD-10-CM | POA: Insufficient documentation

## 2022-06-07 DIAGNOSIS — E1129 Type 2 diabetes mellitus with other diabetic kidney complication: Secondary | ICD-10-CM | POA: Insufficient documentation

## 2022-06-07 DIAGNOSIS — Z9483 Pancreas transplant status: Secondary | ICD-10-CM | POA: Insufficient documentation

## 2022-06-07 DIAGNOSIS — Z09 Encounter for follow-up examination after completed treatment for conditions other than malignant neoplasm: Secondary | ICD-10-CM | POA: Diagnosis not present

## 2022-06-07 DIAGNOSIS — E559 Vitamin D deficiency, unspecified: Secondary | ICD-10-CM | POA: Diagnosis not present

## 2022-06-07 LAB — BASIC METABOLIC PANEL
Anion gap: 10 (ref 5–15)
BUN: 60 mg/dL — ABNORMAL HIGH (ref 6–20)
CO2: 23 mmol/L (ref 22–32)
Calcium: 9.2 mg/dL (ref 8.9–10.3)
Chloride: 104 mmol/L (ref 98–111)
Creatinine, Ser: 3.03 mg/dL — ABNORMAL HIGH (ref 0.61–1.24)
GFR, Estimated: 23 mL/min — ABNORMAL LOW (ref >60)
Glucose, Bld: 152 mg/dL — ABNORMAL HIGH (ref 70–99)
Potassium: 5 mmol/L (ref 3.5–5.1)
Sodium: 137 mmol/L (ref 135–145)

## 2022-06-07 LAB — CBC WITH DIFFERENTIAL/PLATELET
Abs Immature Granulocytes: 0.38 10*3/uL — ABNORMAL HIGH (ref 0.00–0.07)
Basophils Absolute: 0 10*3/uL (ref 0.0–0.1)
Basophils Relative: 1 %
Eosinophils Absolute: 0.1 10*3/uL (ref 0.0–0.5)
Eosinophils Relative: 2 %
HCT: 43 % (ref 39.0–52.0)
Hemoglobin: 12.4 g/dL — ABNORMAL LOW (ref 13.0–17.0)
Immature Granulocytes: 7 %
Lymphocytes Relative: 12 %
Lymphs Abs: 0.7 10*3/uL (ref 0.7–4.0)
MCH: 24.2 pg — ABNORMAL LOW (ref 26.0–34.0)
MCHC: 28.8 g/dL — ABNORMAL LOW (ref 30.0–36.0)
MCV: 83.8 fL (ref 80.0–100.0)
Monocytes Absolute: 0.7 10*3/uL (ref 0.1–1.0)
Monocytes Relative: 13 %
Neutro Abs: 3.7 10*3/uL (ref 1.7–7.7)
Neutrophils Relative %: 65 %
Platelets: 197 10*3/uL (ref 150–400)
RBC: 5.13 MIL/uL (ref 4.22–5.81)
RDW: 14.5 % (ref 11.5–15.5)
WBC: 5.6 10*3/uL (ref 4.0–10.5)
nRBC: 0 % (ref 0.0–0.2)

## 2022-06-07 LAB — PHOSPHORUS: Phosphorus: 5.6 mg/dL — ABNORMAL HIGH (ref 2.5–4.6)

## 2022-06-07 LAB — MAGNESIUM: Magnesium: 2 mg/dL (ref 1.7–2.4)

## 2022-06-07 MED FILL — GABAPENTIN 100 MG CAPSULE: ORAL | 30 days supply | Qty: 60 | Fill #4

## 2022-06-07 MED FILL — JARDIANCE 25 MG TABLET: ORAL | 90 days supply | Qty: 90 | Fill #1

## 2022-06-08 LAB — EPSTEIN-BARR VIRUS (EBV) ANTIBODY PROFILE
EBV NA IgG: 245 U/mL — ABNORMAL HIGH (ref 0.0–17.9)
EBV VCA IgG: >600 U/mL — ABNORMAL HIGH (ref 0.0–17.9)
EBV VCA IgM: <36 U/mL (ref 0.0–35.9)

## 2022-06-09 LAB — TACROLIMUS LEVEL: Tacrolimus (FK506) - LabCorp: 9 ng/mL (ref 2.0–20.0)

## 2022-06-10 LAB — EPSTEIN BARR VRS(EBV DNA BY PCR): EBV DNA QN by PCR: NEGATIVE IU/mL

## 2022-06-14 ENCOUNTER — Other Ambulatory Visit: Payer: Self-pay

## 2022-06-14 ENCOUNTER — Other Ambulatory Visit: Payer: Self-pay | Admitting: Family

## 2022-06-14 DIAGNOSIS — E1140 Type 2 diabetes mellitus with diabetic neuropathy, unspecified: Secondary | ICD-10-CM

## 2022-06-14 MED ORDER — INSULIN GLARGINE 100 UNITS/ML SOLOSTAR PEN: 35.0000 [IU] | PEN_INJECTOR | Freq: Every day | SUBCUTANEOUS | 2 refills | Status: DC

## 2022-06-17 ENCOUNTER — Other Ambulatory Visit: Payer: Self-pay

## 2022-06-17 ENCOUNTER — Other Ambulatory Visit (HOSPITAL_COMMUNITY)
Admission: RE | Admit: 2022-06-17 | Discharge: 2022-06-17 | Disposition: A | Discharge status: Home / Self Care | Payer: 59 | Source: Ambulatory Visit | Attending: Pediatric Nephrology | Admitting: Pediatric Nephrology

## 2022-06-17 DIAGNOSIS — Z940 Kidney transplant status: Secondary | ICD-10-CM | POA: Insufficient documentation

## 2022-06-17 LAB — BASIC METABOLIC PANEL
Anion gap: 7 (ref 5–15)
BUN: 62 mg/dL — ABNORMAL HIGH (ref 6–20)
CO2: 21 mmol/L — ABNORMAL LOW (ref 22–32)
Calcium: 9.2 mg/dL (ref 8.9–10.3)
Chloride: 109 mmol/L (ref 98–111)
Creatinine, Ser: 2.56 mg/dL — ABNORMAL HIGH (ref 0.61–1.24)
GFR, Estimated: 28 mL/min — ABNORMAL LOW (ref >60)
Glucose, Bld: 149 mg/dL — ABNORMAL HIGH (ref 70–99)
Potassium: 4.8 mmol/L (ref 3.5–5.1)
Sodium: 137 mmol/L (ref 135–145)

## 2022-06-17 LAB — PHOSPHORUS: Phosphorus: 4.6 mg/dL (ref 2.5–4.6)

## 2022-06-17 MED ORDER — INSULIN GLARGINE 100 UNITS/ML SOLOSTAR PEN: 35.0000 [IU] | PEN_INJECTOR | Freq: Every day | SUBCUTANEOUS | 3 refills | Status: DC

## 2022-06-17 MED ORDER — INSULIN GLARGINE 100 UNIT/ML SOLOSTAR PEN: 35.0000 [IU] | PEN_INJECTOR | Freq: Every day | SUBCUTANEOUS | 3 refills | Status: DC

## 2022-06-17 MED FILL — MYCOPHENOLATE SODIUM 180 MG TABLET,DELAYED RELEASE: ORAL | 30 days supply | Qty: 180 | Fill #5

## 2022-06-17 MED FILL — AMLODIPINE 10 MG TABLET: ORAL | 90 days supply | Qty: 90 | Fill #1

## 2022-06-17 MED FILL — CARVEDILOL 12.5 MG TABLET: ORAL | 90 days supply | Qty: 540 | Fill #1

## 2022-06-17 MED FILL — CHLORTHALIDONE 25 MG TABLET: ORAL | 90 days supply | Qty: 180 | Fill #2

## 2022-06-17 MED FILL — ALLOPURINOL 100 MG TABLET: ORAL | 90 days supply | Qty: 45 | Fill #2

## 2022-06-19 ENCOUNTER — Encounter (INDEPENDENT_AMBULATORY_CARE_PROVIDER_SITE_OTHER): Payer: Medicare Other | Admitting: Ophthalmology

## 2022-06-21 DIAGNOSIS — Z940 Kidney transplant status: Secondary | ICD-10-CM | POA: Diagnosis not present

## 2022-06-26 ENCOUNTER — Ambulatory Visit
Admission: RE | Admit: 2022-06-26 | Discharge: 2022-06-26 | Discharge status: Against Medical Advice | Payer: 59 | Source: Ambulatory Visit | Attending: Family | Admitting: Family

## 2022-07-01 MED FILL — HYDRALAZINE 25 MG TABLET: ORAL | 90 days supply | Qty: 540 | Fill #2

## 2022-07-08 ENCOUNTER — Other Ambulatory Visit (HOSPITAL_COMMUNITY)
Admission: RE | Admit: 2022-07-08 | Discharge: 2022-07-08 | Disposition: A | Discharge status: Home / Self Care | Payer: 59 | Source: Ambulatory Visit | Attending: Pediatric Nephrology | Admitting: Pediatric Nephrology

## 2022-07-08 DIAGNOSIS — D899 Disorder involving the immune mechanism, unspecified: Secondary | ICD-10-CM | POA: Insufficient documentation

## 2022-07-08 DIAGNOSIS — Z940 Kidney transplant status: Secondary | ICD-10-CM | POA: Insufficient documentation

## 2022-07-10 MED ORDER — ERGOCALCIFEROL (VITAMIN D2) 1,250 MCG (50,000 UNIT) CAPSULE
ORAL_CAPSULE | ORAL | 0 refills | 84 days | Status: CP
Start: 2022-07-10 — End: 2022-10-08
  Filled 2022-07-11: qty 12, 84d supply, fill #0

## 2022-07-11 ENCOUNTER — Ambulatory Visit (INDEPENDENT_AMBULATORY_CARE_PROVIDER_SITE_OTHER): Payer: 59 | Admitting: Orthopedic Surgery

## 2022-07-11 DIAGNOSIS — Z89511 Acquired absence of right leg below knee: Secondary | ICD-10-CM

## 2022-07-11 DIAGNOSIS — L97221 Non-pressure chronic ulcer of left calf limited to breakdown of skin: Secondary | ICD-10-CM | POA: Diagnosis not present

## 2022-07-11 MED FILL — MYCOPHENOLATE SODIUM 180 MG TABLET,DELAYED RELEASE: ORAL | 30 days supply | Qty: 180 | Fill #6

## 2022-07-11 MED FILL — GABAPENTIN 100 MG CAPSULE: ORAL | 30 days supply | Qty: 60 | Fill #5

## 2022-07-11 MED FILL — SPIRONOLACTONE 25 MG TABLET: ORAL | 90 days supply | Qty: 180 | Fill #1

## 2022-07-15 ENCOUNTER — Other Ambulatory Visit (HOSPITAL_COMMUNITY)
Admission: RE | Admit: 2022-07-15 | Discharge: 2022-07-15 | Disposition: A | Discharge status: Home / Self Care | Payer: 59 | Source: Ambulatory Visit | Attending: Pediatric Nephrology | Admitting: Pediatric Nephrology

## 2022-07-15 DIAGNOSIS — B9689 Other specified bacterial agents as the cause of diseases classified elsewhere: Secondary | ICD-10-CM | POA: Insufficient documentation

## 2022-07-15 DIAGNOSIS — Z940 Kidney transplant status: Secondary | ICD-10-CM | POA: Diagnosis not present

## 2022-07-15 DIAGNOSIS — E559 Vitamin D deficiency, unspecified: Secondary | ICD-10-CM | POA: Diagnosis not present

## 2022-07-15 DIAGNOSIS — Z114 Encounter for screening for human immunodeficiency virus [HIV]: Secondary | ICD-10-CM | POA: Insufficient documentation

## 2022-07-15 DIAGNOSIS — E1129 Type 2 diabetes mellitus with other diabetic kidney complication: Secondary | ICD-10-CM | POA: Insufficient documentation

## 2022-07-15 DIAGNOSIS — Z789 Other specified health status: Secondary | ICD-10-CM | POA: Insufficient documentation

## 2022-07-15 DIAGNOSIS — B259 Cytomegaloviral disease, unspecified: Secondary | ICD-10-CM | POA: Diagnosis not present

## 2022-07-15 DIAGNOSIS — D899 Disorder involving the immune mechanism, unspecified: Secondary | ICD-10-CM | POA: Diagnosis not present

## 2022-07-15 DIAGNOSIS — N390 Urinary tract infection, site not specified: Secondary | ICD-10-CM | POA: Insufficient documentation

## 2022-07-15 DIAGNOSIS — D631 Anemia in chronic kidney disease: Secondary | ICD-10-CM | POA: Diagnosis not present

## 2022-07-15 DIAGNOSIS — Z9483 Pancreas transplant status: Secondary | ICD-10-CM | POA: Insufficient documentation

## 2022-07-15 DIAGNOSIS — Z09 Encounter for follow-up examination after completed treatment for conditions other than malignant neoplasm: Secondary | ICD-10-CM | POA: Diagnosis not present

## 2022-07-15 LAB — CBC WITH DIFFERENTIAL/PLATELET
Abs Immature Granulocytes: 0.04 10*3/uL (ref 0.00–0.07)
Basophils Absolute: 0 10*3/uL (ref 0.0–0.1)
Basophils Relative: 0 %
Eosinophils Absolute: 0.1 10*3/uL (ref 0.0–0.5)
Eosinophils Relative: 1 %
HCT: 37.4 % — ABNORMAL LOW (ref 39.0–52.0)
Hemoglobin: 11.1 g/dL — ABNORMAL LOW (ref 13.0–17.0)
Immature Granulocytes: 0 %
Lymphocytes Relative: 8 %
Lymphs Abs: 0.8 10*3/uL (ref 0.7–4.0)
MCH: 24.8 pg — ABNORMAL LOW (ref 26.0–34.0)
MCHC: 29.7 g/dL — ABNORMAL LOW (ref 30.0–36.0)
MCV: 83.5 fL (ref 80.0–100.0)
Monocytes Absolute: 0.9 10*3/uL (ref 0.1–1.0)
Monocytes Relative: 9 %
Neutro Abs: 7.6 10*3/uL (ref 1.7–7.7)
Neutrophils Relative %: 82 %
Platelets: 181 10*3/uL (ref 150–400)
RBC: 4.48 MIL/uL (ref 4.22–5.81)
RDW: 14.5 % (ref 11.5–15.5)
WBC: 9.4 10*3/uL (ref 4.0–10.5)
nRBC: 0 % (ref 0.0–0.2)

## 2022-07-15 LAB — PHOSPHORUS: Phosphorus: 4.4 mg/dL (ref 2.5–4.6)

## 2022-07-15 LAB — BASIC METABOLIC PANEL
Anion gap: 10 (ref 5–15)
BUN: 58 mg/dL — ABNORMAL HIGH (ref 6–20)
CO2: 21 mmol/L — ABNORMAL LOW (ref 22–32)
Calcium: 9.2 mg/dL (ref 8.9–10.3)
Chloride: 104 mmol/L (ref 98–111)
Creatinine, Ser: 2.51 mg/dL — ABNORMAL HIGH (ref 0.61–1.24)
GFR, Estimated: 29 mL/min — ABNORMAL LOW (ref >60)
Glucose, Bld: 149 mg/dL — ABNORMAL HIGH (ref 70–99)
Potassium: 4.5 mmol/L (ref 3.5–5.1)
Sodium: 135 mmol/L (ref 135–145)

## 2022-07-15 LAB — URINALYSIS, COMPLETE (UACMP) WITH MICROSCOPIC
Bilirubin Urine: NEGATIVE
Glucose, UA: >=500 mg/dL — AB
Hgb urine dipstick: NEGATIVE
Ketones, ur: NEGATIVE mg/dL
Nitrite: NEGATIVE
Protein, ur: NEGATIVE mg/dL
Specific Gravity, Urine: 1.016 (ref 1.005–1.030)
pH: 5 (ref 5.0–8.0)

## 2022-07-15 LAB — MAGNESIUM: Magnesium: 2 mg/dL (ref 1.7–2.4)

## 2022-07-16 LAB — URINE CULTURE: Culture: NO GROWTH

## 2022-07-16 LAB — TACROLIMUS LEVEL: Tacrolimus (FK506) - LabCorp: 9.2 ng/mL (ref 2.0–20.0)

## 2022-07-22 ENCOUNTER — Encounter: Payer: Self-pay | Admitting: Orthopedic Surgery

## 2022-07-22 NOTE — Progress Notes (Signed)
Office Visit Note   Patient: Tim Becker           Date of Birth: 1965/08/22           MRN: [id number] Visit Date: 07/11/2022              Requested by: Caesar Bookman, NP [address] [place],  [place] [address] PCP: Caesar Bookman, NP  Chief Complaint  Patient presents with   Right Leg - Follow-up    Hx right BKA ulcer       HPI: Patient is a 57 year old gentleman status post right transtibial amputation.  Patient has been using a felt relieving donut for an ulcer on the left foot.  Patient states the ulcer is healed but is open back up.  Assessment & Plan: Visit Diagnoses:  1. History of right below knee amputation   2. Non-pressure chronic ulcer of left calf, limited to breakdown of skin     Plan: Patient is provided a prescription for new liner and new socket on the right.  Follow-Up Instructions: Return if symptoms worsen or fail to improve.   Ortho Exam  Patient is alert, oriented, no adenopathy, well-dressed, normal affect, normal respiratory effort. Examination patient has a callus over the fibular head and a superficial ulcer over the residual tibia.  He previously also had an ulcer over the patella which has healed.  There is no knee effusion.  Patient is an existing right transtibial  amputee.  Patient's current comorbidities are not expected to impact the ability to function with the prescribed prosthesis. Patient verbally communicates a strong desire to use a prosthesis. Patient currently requires mobility aids to ambulate without a prosthesis.  Expects not to use mobility aids with a new prosthesis.  Patient is a K3 level ambulator that spends a lot of time walking around on uneven terrain over obstacles, up and down stairs, and ambulates with a variable cadence.     Imaging: No results found. No images are attached to the encounter.  Labs: Lab Results  Component Value Date   HGBA1C 6.3 (H) 10/19/2021   HGBA1C 6.3 10/03/2021   HGBA1C 8.6 (H)  04/16/2016   ESRSEDRATE 74 (H) 04/15/2016   ESRSEDRATE 127 (H) 08/10/2014   CRP 13.3 (H) 04/15/2016   CRP 8.9 (H) 08/10/2014   REPTSTATUS 07/16/2022 FINAL 07/15/2022   GRAMSTAIN  03/24/2022    FEW WBC PRESENT,BOTH PMN AND MONONUCLEAR NO ORGANISMS SEEN    CULT  07/15/2022    NO GROWTH Performed at [facility], [address] [place], [place] [address]    LABORGA METHICILLIN RESISTANT STAPHYLOCOCCUS AUREUS 04/16/2016     Lab Results  Component Value Date   ALBUMIN 2.8 (L) 03/23/2022   ALBUMIN 3.3 (L) 03/22/2022   ALBUMIN 4.1 10/03/2021    Lab Results  Component Value Date   MG 2.0 07/15/2022   MG 2.0 06/07/2022   MG 2.0 05/07/2022   No results found for: "VD25OH"  No results found for: "PREALBUMIN"    Latest Ref Rng & Units 07/15/2022    8:31 AM 06/07/2022    8:53 AM 05/07/2022    8:30 AM  CBC EXTENDED  WBC 4.0 - 10.5 K/uL 9.4  5.6  5.3   RBC 4.22 - 5.81 MIL/uL 4.48  5.13  4.74   Hemoglobin 13.0 - 17.0 g/dL 82.9  56.2  13.0   HCT 39.0 - 52.0 % 37.4  43.0  39.9   Platelets 150 - 400  K/uL 181  197  205   NEUT# 1.7 - 7.7 K/uL 7.6  3.7  3.7   Lymph# 0.7 - 4.0 K/uL 0.8  0.7  0.7      There is no height or weight on file to calculate BMI.  Orders:  No orders of the defined types were placed in this encounter.  No orders of the defined types were placed in this encounter.    Procedures: No procedures performed  Clinical Data: No additional findings.  ROS:  All other systems negative, except as noted in the HPI. Review of Systems  Objective: Vital Signs: There were no vitals taken for this visit.  Specialty Comments:  No specialty comments available.  PMFS History: Patient Active Problem List   Diagnosis Date Noted   AKI (acute kidney injury) 03/23/2022   Community acquired pneumonia 03/22/2022   Hyperlipidemia LDL goal <100 10/24/2021   Severe nonproliferative diabetic retinopathy of right eye 09/12/2020   Severe nonproliferative diabetic  retinopathy of left eye 09/12/2020   Nuclear sclerotic cataract of both eyes 09/12/2020   Charcot's joint, left ankle and foot 10/29/2016   History of right below knee amputation 10/29/2016   Diabetic polyneuropathy associated with type 2 diabetes mellitus 10/29/2016   ESRD (end stage renal disease) on dialysis 04/15/2016   Chronic osteomyelitis of toe of left foot 04/15/2016   Left renal mass 07/11/2015   Biliary calculi 09/14/2014   Hemodialysis-associated hypotension 09/13/2014   H/O amputation of leg through tibia and fibula 09/08/2014   Anemia associated with chronic renal failure 09/06/2014   Type 2 diabetes mellitus with diabetic neuropathy (HCC) 08/07/2014   Essential hypertension 08/07/2014   Obesity 08/07/2014   Hypothyroidism 08/07/2014   Kidney lump 04/06/2014   Obstructive apnea 04/06/2014   Diabetes mellitus 03/23/2014   Patient awaiting renal transplant 03/23/2014   Asthma, moderate persistent 10/28/2013   Anemia due to blood loss 09/14/2013   Morbid obesity 09/11/2013   Morbid (severe) obesity due to excess calories 09/11/2013   Chronic kidney disease, stage IV (severe) 09/10/2013   Type 2 diabetes mellitus 09/10/2013   Mild persistent asthma with acute exacerbation 09/10/2013   Abnormal ECG 05/07/2013   CAD in native artery 05/07/2013   Past Medical History:  Diagnosis Date   Anemia    Arthritis    Cancer    Renal Tumor   CKD (chronic kidney disease)    Coronary artery disease    Diabetes    DM (diabetes mellitus) with complications    ESRD (end stage renal disease)    Pre- dialysis   History of colonoscopy 2011   Hypertension    Kidney transplanted 2022   Left kidney mass    Neuropathy    Obesity    Osteomyelitis, chronic, ankle or foot    PONV (postoperative nausea and vomiting)    Renal insufficiency    Patient is on Dialysis and receives M,W and F.   S/P BKA (below knee amputation) 08/2014   Right , Marcy Panning, [place]   Sleep apnea    CPAP    Thyroid disease     Family History  Problem Relation Age of Onset   Diabetes Mother    Kidney disease Mother    Breast cancer Mother     Past Surgical History:  Procedure Laterality Date   BASCILIC VEIN TRANSPOSITION Left 02/17/2015   Procedure: BASCILIC VEIN TRANSPOSITION;  Surgeon: Renford Dills, MD;  Location: [facility];  Service: Vascular;  Laterality: Left;  BELOW KNEE LEG AMPUTATION Right    CORONARY ANGIOPLASTY WITH STENT PLACEMENT     EYE SURGERY     FOOT SURGERY     Multiple R foot surgery for infection and charcot foot   I & D EXTREMITY Left 04/16/2016   Procedure: IRRIGATION AND DEBRIDEMENT EXTREMITY/GREAT TOE AMP.;  Surgeon: Felecia Shelling, DPM;  Location: [facility];  Service: Podiatry;  Laterality: Left;   JOINT REPLACEMENT     LAPAROSCOPIC NEPHRECTOMY, HAND ASSISTED Left 07/11/2015   Procedure: HAND ASSISTED LAPAROSCOPIC NEPHRECTOMY;  Surgeon: Vanna Scotland, MD;  Location: [facility];  Service: Urology;  Laterality: Left;   PERIPHERAL VASCULAR CATHETERIZATION Left 12/06/2014   Procedure: A/V Shuntogram/Fistulagram;  Surgeon: Renford Dills, MD;  Location: [facility];  Service: Cardiovascular;  Laterality: Left;   PERIPHERAL VASCULAR CATHETERIZATION Left 12/06/2014   Procedure: A/V Shunt Intervention;  Surgeon: Renford Dills, MD;  Location: [facility];  Service: Cardiovascular;  Laterality: Left;   TONSILLECTOMY     Social History   Occupational History   Not on file  Tobacco Use   Smoking status: Never   Smokeless tobacco: Never  Vaping Use   Vaping Use: Never used  Substance and Sexual Activity   Alcohol use: No   Drug use: Never   Sexual activity: Yes

## 2022-07-23 DIAGNOSIS — E103593 Type 1 diabetes mellitus with proliferative diabetic retinopathy without macular edema, bilateral: Secondary | ICD-10-CM | POA: Diagnosis not present

## 2022-07-23 DIAGNOSIS — Z961 Presence of intraocular lens: Secondary | ICD-10-CM | POA: Diagnosis not present

## 2022-07-23 LAB — HM DIABETES EYE EXAM

## 2022-07-25 ENCOUNTER — Encounter: Payer: Self-pay | Admitting: Family

## 2022-07-25 ENCOUNTER — Ambulatory Visit (INDEPENDENT_AMBULATORY_CARE_PROVIDER_SITE_OTHER): Payer: 59 | Admitting: Family

## 2022-07-25 DIAGNOSIS — Z0000 Encounter for general adult medical examination without abnormal findings: Secondary | ICD-10-CM | POA: Diagnosis not present

## 2022-07-25 NOTE — Progress Notes (Addendum)
Subjective:   Tim Becker is a 57 y.o. male who presents for Medicare Annual/Subsequent preventive examination.   Review of Systems     Cardiac Risk Factors include: advanced age (>25men, >3 women);diabetes mellitus;obesity (BMI >30kg/m2);male gender;dyslipidemia;hypertension     Objective:    There were no vitals filed for this visit. There is no height or weight on file to calculate BMI.     07/25/2022   12:57 PM 04/26/2022   10:22 AM 10/24/2021   10:01 AM 06/21/2021    1:54 PM 12/15/2016   12:23 PM 09/15/2016    1:33 PM 04/16/2016    1:00 AM  Advanced Directives  Does Patient Have a Medical Advance Directive? No No No No No No No  Does patient want to make changes to medical advance directive?  No - Patient declined       Would patient like information on creating a medical advance directive? No - Patient declined  No - Patient declined No - Patient declined   No - Patient declined    Current Medications (verified) Outpatient Encounter Medications as of 07/25/2022  Medication Sig   Accu-Chek Softclix Lancets lancets Test blood sugar 3 times a day. DX: E11.40   acetaminophen (TYLENOL) 500 MG tablet Take 500 mg by mouth every 6 (six) hours as needed for moderate pain or mild pain.   allopurinol (ZYLOPRIM) 100 MG tablet Take 100 mg by mouth every other day.   amLODipine (NORVASC) 10 MG tablet Take 10 mg by mouth in the morning. 9am   aspirin EC 81 MG tablet Take 81 mg by mouth daily. Swallow whole.   atorvastatin (LIPITOR) 40 MG tablet Take 40 mg by mouth daily. 9pm.   Blood Glucose Monitoring Suppl (ACCU-CHEK AVIVA PLUS) w/Device KIT Use to test blood sugar three times daily. Dx: E11.40   carvedilol (COREG) 12.5 MG tablet Take 37.5 mg by mouth in the morning, at noon, and at bedtime. 9am , and 9pm   chlorthalidone (HYGROTON) 25 MG tablet Take 25 mg by mouth daily.   empagliflozin (JARDIANCE) 10 MG TABS tablet Take 10 mg by mouth daily.   ergocalciferol (VITAMIN D2) 1.25 MG  (50000 UT) capsule Take 50,000 Units by mouth once a week.   furosemide (LASIX) 80 MG tablet Take 80 mg by mouth as needed for edema or fluid.   glucose blood (ACCU-CHEK AVIVA PLUS) test strip Use to test blood sugar three times daily. Dx:E11.40   hydrALAZINE (APRESOLINE) 25 MG tablet Take 75 mg by mouth 3 (three) times daily.   insulin glargine (LANTUS) 100 UNIT/ML Solostar Pen Inject 35 Units into the skin daily. 35 units daily before breakfast   insulin lispro (HUMALOG) 100 UNIT/ML KwikPen .03-0.17 mLs (3-17 Units total) into the skin 2 (two) times daily as needed for high blood sugar E11.40   Insulin Pen Needle (BD PEN NEEDLE NANO 2ND GEN) 32G X 4 MM MISC Use to test blood sugar four times daily. Dx:E11.40   Iron, Ferrous Sulfate, 325 (65 Fe) MG TABS Take 325 mg by mouth daily.   levothyroxine (SYNTHROID) 75 MCG tablet Take 75 mcg by mouth daily before breakfast. 7am   mycophenolate (MYFORTIC) 180 MG EC tablet Take 3 tablets (540 mg total) by mouth in the morning and at bedtime. 3 tablets (540 mg total per dose) at 9am and 9pm   OZEMPIC, 1 MG/DOSE, 4 MG/3ML SOPN INJECT 1 MG INTO THE SKIN ONCE A WEEK. 9AM   spironolactone (ALDACTONE) 25 MG tablet Take 50  mg by mouth every morning.   tacrolimus ER (ENVARSUS XR) 1 MG TB24 Take 6 mg by mouth daily before breakfast.   No facility-administered encounter medications on file as of 07/25/2022.    Allergies (verified) Ferric citrate   History: Past Medical History:  Diagnosis Date   Anemia    Arthritis    Cancer    Renal Tumor   CKD (chronic kidney disease)    Coronary artery disease    Diabetes    DM (diabetes mellitus) with complications    ESRD (end stage renal disease)    Pre- dialysis   History of colonoscopy 2011   Hypertension    Kidney transplanted 2022   Left kidney mass    Neuropathy    Obesity    Osteomyelitis, chronic, ankle or foot    PONV (postoperative nausea and vomiting)    Renal insufficiency    Patient is on  Dialysis and receives M,W and F.   S/P BKA (below knee amputation) 08/2014   Right , [facility], [place]   Sleep apnea    CPAP   Thyroid disease    Past Surgical History:  Procedure Laterality Date   BASCILIC VEIN TRANSPOSITION Left 02/17/2015   Procedure: BASCILIC VEIN TRANSPOSITION;  Surgeon: Renford Dills, MD;  Location: [facility] ORS;  Service: Vascular;  Laterality: Left;   BELOW KNEE LEG AMPUTATION Right    CORONARY ANGIOPLASTY WITH STENT PLACEMENT     EYE SURGERY     FOOT SURGERY     Multiple R foot surgery for infection and charcot foot   I & D EXTREMITY Left 04/16/2016   Procedure: IRRIGATION AND DEBRIDEMENT EXTREMITY/GREAT TOE AMP.;  Surgeon: Felecia Shelling, DPM;  Location: [facility] OR;  Service: Podiatry;  Laterality: Left;   JOINT REPLACEMENT     LAPAROSCOPIC NEPHRECTOMY, HAND ASSISTED Left 07/11/2015   Procedure: HAND ASSISTED LAPAROSCOPIC NEPHRECTOMY;  Surgeon: Vanna Scotland, MD;  Location: [facility] ORS;  Service: Urology;  Laterality: Left;   PERIPHERAL VASCULAR CATHETERIZATION Left 12/06/2014   Procedure: A/V Shuntogram/Fistulagram;  Surgeon: Renford Dills, MD;  Location: [facility] IN[facility]IVE CV [facility];  Service: Cardiovascular;  Laterality: Left;   PERIPHERAL VASCULAR CATHETERIZATION Left 12/06/2014   Procedure: A/V Shunt Intervention;  Surgeon: Renford Dills, MD;  Location: [facility];  Service: Cardiovascular;  Laterality: Left;   TONSILLECTOMY     Family History  Problem Relation Age of Onset   Diabetes Mother    Kidney disease Mother    Breast cancer Mother    Social History   Socioeconomic History   Marital status: Married    Spouse name: Not on file   Number of children: Not on file   Years of education: Not on file   Highest education level: Not on file  Occupational History   Not on file  Tobacco Use   Smoking status: Never   Smokeless tobacco: Never  Vaping Use   Vaping Use: Never used  Substance and Sexual Activity   Alcohol use: No   Drug use: Never    Sexual activity: Yes  Other Topics Concern   Not on file  Social History Narrative   Tobacco use, amount per day now: No   Past tobacco use, amount per day:   How many years did you use tobacco:   Alcohol use (drinks per week): No   Diet:   Do you drink/eat things with caffeine: Yes   Marital status:   Married  What year were you married? 2015   Do you live in a house, apartment, assisted living, condo, trailer, etc.? Townhouse   Is it one or more stories? 2 floor   How many persons live in your home? 2   Do you have pets in your home?( please list) No   Highest Level of education completed? Some College.   Current or past profession: Retail.   Do you exercise?                                  Type and how often?   Do you have a living will? No   Do you have a DNR form?                                   If not, do you want to discuss one?   Do you have signed POA/HPOA forms?                        If so, please bring to you appointment      Do you have any difficulty bathing or dressing yourself? No   Do you have any difficulty preparing food or eating? No   Do you have any difficulty managing your medications? No   Do you have any difficulty managing your finances? No   Do you have any difficulty affording your medications? No   Social Determinants of Health   Financial Resource Strain: Not on file  Food Insecurity: No Food Insecurity (03/26/2022)   Hunger Vital Sign    Worried About Running Out of Food in the Last Year: Never true    Ran Out of Food in the Last Year: Never true  Transportation Needs: No Transportation Needs (03/26/2022)   PRAPARE - Administrator, Civil Service (Medical): No    Lack of Transportation (Non-Medical): No  Physical Activity: Not on file  Stress: Not on file  Social Connections: Not on file    Tobacco Counseling Counseling given: Not Answered   Clinical Intake:  Pre-visit preparation completed:  No  Pain : No/denies pain     BMI - recorded: 34.94 Nutritional Status: BMI > 30  Obese Nutritional Risks: None Diabetes: Yes CBG done?: Yes (142) CBG resulted in Enter/ Edit results?: Yes Did pt. bring in CBG monitor from home?: Yes (reports 140's - 200's.) Glucose Meter Downloaded?: No   Diabetic?Yes    Activities of Daily Living    07/25/2022    1:13 PM 07/25/2022   12:57 PM  In your present state of health, do you have any difficulty performing the following activities:  Hearing? 0 0  Vision? 0 0  Difficulty concentrating or making decisions? 0 0  Walking or climbing stairs? 0 0  Dressing or bathing? 0 0  Doing errands, shopping? 0 0  Preparing Food and eating ? N N  Using the Toilet? Y N  Comment diarrhea   In the past six months, have you accidently leaked urine? N N  Do you have problems with loss of bowel control? N N  Managing your Medications? N N  Managing your Finances? N N  Housekeeping or managing your Housekeeping? N N    Patient Care Team: Aneliese Beaudry, Donalee Citrin, NP as PCP - General (Family Medicine) Jodelle Red, MD as PCP - Cardiology (  Cardiology) Nadara Mustard, MD as Consulting Physician (Orthopedic Surgery) Felecia Shelling, DPM as Consulting Physician (Podiatry)  Indicate any recent Medical Services you may have received from other than [facility] providers in the past year (date may be approximate).     Assessment:   This is a routine wellness examination for Basilio.  Hearing/Vision screen No results found.  Dietary issues and exercise activities discussed: Current Exercise Habits: Home exercise routine, Type of exercise: Other - see comments (rides bike), Time (Minutes): 20, Frequency (Times/Week): 2, Weekly Exercise (Minutes/Week): 40, Intensity: Moderate, Exercise limited by: None identified   Goals Addressed             This Visit's Progress    Weight (lb) < 200 lb (90.7 kg)         Depression Screen    07/25/2022   12:57 PM  04/26/2022   10:55 AM  PHQ 2/9 Scores  PHQ - 2 Score 0 0    Fall Risk    07/25/2022   12:57 PM 04/26/2022   10:22 AM 10/24/2021   10:01 AM 06/21/2021    1:53 PM  Fall Risk   Falls in the past year? 0 0 0 0  Number falls in past yr: 0 0 0 0  Injury with Fall? 0 0 0 0  Risk for fall due to : No Fall Risks No Fall Risks No Fall Risks No Fall Risks  Follow up  Falls evaluation completed Falls evaluation completed Falls evaluation completed    FALL RISK PREVENTION PERTAINING TO THE HOME:  Any stairs in or around the home? Yes  If so, are there any without handrails? No  Home free of loose throw rugs in walkways, pet beds, electrical cords, etc? No  Adequate lighting in your home to reduce risk of falls? Yes   ASSISTIVE DEVICES UTILIZED TO PREVENT FALLS:  Life alert? No  Use of a cane, walker or w/c? No  Grab bars in the bathroom? No  Shower chair or bench in shower? Yes  Elevated toilet seat or a handicapped toilet? No   TIMED UP AND GO:  Was the test performed? No .  Length of time to ambulate 10 feet: N/A sec.   Gait slow and steady without use of assistive device  Cognitive Function:        07/25/2022   12:58 PM  6CIT Screen  What Year? 0 points  What month? 0 points  What time? 0 points  Count back from 20 0 points  Months in reverse 0 points  Repeat phrase 2 points  Total Score 2 points    Immunizations Immunization History  Administered Date(s) Administered   Covid-19, Mrna,Vaccine(Spikevax)72yrs and older 04/19/2022   Hepatitis B, ADULT 12/30/2014, 01/27/2015, 03/01/2015, 06/30/2015   Influenza Split 07/31/2011, 01/31/2020   Influenza,inj,Quad PF,6+ Mos 01/19/2020, 12/27/2020, 04/26/2021, 01/16/2022   Influenza-Unspecified 01/19/2020   PFIZER(Purple Top)SARS-COV-2 Vaccination 06/11/2019, 07/07/2019, 12/31/2019, 01/31/2020, 08/31/2020, 10/11/2020   PNEUMOCOCCAL CONJUGATE-20 01/16/2022   Pfizer Covid-19 Vaccine Bivalent Booster 71yrs & up 04/26/2021    Pneumococcal Polysaccharide-23 05/22/2015   Tdap 03/01/2017    TDAP status: Up to date  Flu Vaccine status: Up to date  Pneumococcal vaccine status: Up to date  Covid-19 vaccine status: Completed vaccines  Qualifies for Shingles Vaccine? No   Zostavax completed No   Shingrix Completed?: No.    Education has been provided regarding the importance of this vaccine. Patient has been advised to call insurance company to determine out of pocket  expense if they have not yet received this vaccine. Advised may also receive vaccine at local pharmacy or Health Dept. Verbalized acceptance and understanding.  Screening Tests Health Maintenance  Topic Date Due   FOOT EXAM  Never done   HEMOGLOBIN A1C  04/21/2022   COVID-19 Vaccine (8 - 2023-24 season) 08/10/2022 (Originally 06/14/2022)   Zoster Vaccines- Shingrix (1 of 2) 10/24/2022 (Originally [birth year]1984)   INFLUENZA VACCINE  10/31/2022   OPHTHALMOLOGY EXAM  07/23/2023   Medicare Annual Wellness (AWV)  07/25/2023   DTaP/Tdap/Td (2 - Td or Tdap) 03/02/2027   COLONOSCOPY (Pts 45-36yrs Insurance coverage will need to be confirmed)  11/14/2031   Hepatitis C Screening  Completed   HIV Screening  Completed   HPV VACCINES  Aged Out    Health Maintenance  Health Maintenance Due  Topic Date Due   FOOT EXAM  Never done   HEMOGLOBIN A1C  04/21/2022    Colorectal cancer screening: Type of screening: Colonoscopy. Completed 11/13/2021. Repeat every 10 years  Lung Cancer Screening: (Low Dose CT Chest recommended if Age 22-80 years, 30 pack-year currently smoking OR have quit w/in 15years.) does not qualify.   Lung Cancer Screening Referral: No  Additional Screening:  Hepatitis C Screening: does qualify; Completed Yes   Vision Screening: Recommended annual ophthalmology exams for early detection of glaucoma and other disorders of the eye. Is the patient up to date with their annual eye exam?  Yes  Who is the provider or what is the name of the  office in which the patient attends annual eye exams? Dr.Groat  If pt is not established with a provider, would they like to be referred to a provider to establish care? No .   Dental Screening: Recommended annual dental exams for proper oral hygiene  Community Resource Referral / Chronic Care Management: CRR required this visit?  No   CCM required this visit?  No      Plan:     I have personally reviewed and noted the following in the patient's chart:   Medical and social history Use of alcohol, tobacco or illicit drugs  Current medications and supplements including opioid prescriptions. Patient is not currently taking opioid prescriptions. Functional ability and status Nutritional status Physical activity Advanced directives List of other physicians Hospitalizations, surgeries, and ER visits in previous 12 months Vitals Screenings to include cognitive, depression, and falls Referrals and appointments  In addition, I have reviewed and discussed with patient certain preventive protocols, quality metrics, and best practice recommendations. A written personalized care plan for preventive services as well as general preventive health recommendations were provided to patient.  Nurse Notes: Had PNA 20 given by Nephrology's office.will obtain records.   This service is provided via telemedicine  No vital signs collected/recorded due to the encounter was a telemedicine visit.   Location of patient (ex: home, work):  Home   Patient consents to a telephone visit:  yes  Location of the provider (ex: office, home):  [facility] office   Name of any referring provider: Tejon Gracie,FNP- C  I connected with Lockwood Schnur on 07/25/2022 by a video enabled telemedicine application and verified that I am speaking with the correct person using two identifiers.   I discussed the limitations of evaluation and management by telemedicine. The patient expressed understanding and agreed to  proceed.  Spent 20 minutes of face to face with patient  >50% time spent counseling; reviewing medical record; labs; AWV and developing future plan of care.  Kaliope Quinonez  C Leannah Guse, NP   07/25/2022

## 2022-07-25 NOTE — Patient Instructions (Signed)
  Mr. Wavra , Thank you for taking time to come for your Medicare Wellness Visit. I appreciate your ongoing commitment to your health goals. Please review the following plan we discussed and let me know if I can assist you in the future.   These are the goals we discussed:  Goals   None     This is a list of the screening recommended for you and due dates:  Health Maintenance  Topic Date Due   Medicare Annual Wellness Visit  Never done   Complete foot exam   Never done   Hemoglobin A1C  04/21/2022   COVID-19 Vaccine (8 - 2023-24 season) 08/10/2022*   Zoster (Shingles) Vaccine (1 of 2) 10/24/2022*   Flu Shot  10/31/2022   Eye exam for diabetics  07/23/2023   DTaP/Tdap/Td vaccine (2 - Td or Tdap) 03/02/2027   Colon Cancer Screening  11/14/2031   Hepatitis C Screening: USPSTF Recommendation to screen - Ages 18-79 yo.  Completed   HIV Screening  Completed   HPV Vaccine  Aged Out  *Topic was postponed. The date shown is not the original due date.

## 2022-07-26 ENCOUNTER — Encounter: Payer: Self-pay | Admitting: Family

## 2022-07-26 ENCOUNTER — Ambulatory Visit (INDEPENDENT_AMBULATORY_CARE_PROVIDER_SITE_OTHER): Payer: 59 | Admitting: Family

## 2022-07-26 VITALS — BP 148/70 | HR 78 | Temp 98.3°F | Resp 16 | Ht 68.0 in | Wt 230.0 lb

## 2022-07-26 DIAGNOSIS — L03012 Cellulitis of left finger: Secondary | ICD-10-CM

## 2022-07-26 DIAGNOSIS — E1140 Type 2 diabetes mellitus with diabetic neuropathy, unspecified: Secondary | ICD-10-CM

## 2022-07-26 DIAGNOSIS — T23122A Burn of first degree of single left finger (nail) except thumb, initial encounter: Secondary | ICD-10-CM | POA: Diagnosis not present

## 2022-07-26 DIAGNOSIS — Z794 Long term (current) use of insulin: Secondary | ICD-10-CM | POA: Diagnosis not present

## 2022-07-26 MED ORDER — ACCU-CHEK AVIVA PLUS VI STRP: ORAL_STRIP | 11 refills | Status: DC

## 2022-07-26 MED ORDER — DOXYCYCLINE HYCLATE 100 MG PO TABS: 100.0000 mg | ORAL_TABLET | Freq: Two times a day (BID) | ORAL | 0 refills | Status: AC

## 2022-07-26 MED ORDER — BD PEN NEEDLE NANO 2ND GEN 32G X 4 MM MISC: 5 refills | Status: DC

## 2022-07-26 NOTE — Progress Notes (Signed)
Provider: Richarda Blade FNP-C   Nina Hoar, Donalee Citrin, NP  Patient Care Team: Ardean Melroy, Donalee Citrin, NP as PCP - General (Family Medicine) Jodelle Red, MD as PCP - Cardiology (Cardiology) Nadara Mustard, MD as Consulting Physician (Orthopedic Surgery) Felecia Shelling, DPM as Consulting Physician (Podiatry)  Extended Emergency Contact Information Primary Emergency Contact: [name],[name] Address: [address] [place], [place] [address] [place] of [place] Home Phone: [phone] Mobile Phone: [phone] Relation: Spouse  Code Status:  Full Code  Goals of care: Advanced Directive information    07/25/2022   12:57 PM  Advanced Directives  Does Patient Have a Medical Advance Directive? No  Would patient like information on creating a medical advance directive? No - Patient declined     Chief Complaint  Patient presents with   Burn    Burned left pinky about a month ago. Has a dark spot he wants checked. No pain and his finger still has sensation in finger. Been using antibiotic ointment. FBS this am was 143.     HPI:  Pt is a 57 y.o. male seen today evaluation of left pinky burned area x 1 month.Has a dark spot on tip of finger not sure whether he was burned by grease while deep frying. States every now and then has neuropathy which has improved over the years. Has used antibiotic ointment. He denies any fever,chills or drainage.   Past Medical History:  Diagnosis Date   Anemia    Arthritis    Cancer (HCC)    Renal Tumor   CKD (chronic kidney disease)    Coronary artery disease    Diabetes (HCC)    DM (diabetes mellitus) with complications (HCC)    ESRD (end stage renal disease) (HCC)    Pre- dialysis   History of colonoscopy 2011   Hypertension    Kidney transplanted 2022   Left kidney mass    Neuropathy    Obesity    Osteomyelitis, chronic, ankle or foot (HCC)    PONV (postoperative nausea and vomiting)    Renal insufficiency     Patient is on Dialysis and receives M,W and F.   S/P BKA (below knee amputation) (HCC) 08/2014   Right , [facility]cy Panning, [place]   Sleep apnea    CPAP   Thyroid disease    Past Surgical History:  Procedure Laterality Date   BASCILIC VEIN TRANSPOSITION Left 02/17/2015   Procedure: BASCILIC VEIN TRANSPOSITION;  Surgeon: Renford Dills, MD;  Location: [facility];  Service: Vascular;  Laterality: Left;   BELOW KNEE LEG AMPUTATION Right    CORONARY ANGIOPLASTY WITH STENT PLACEMENT     EYE SURGERY     FOOT SURGERY     Multiple R foot surgery for infection and charcot foot   I & D EXTREMITY Left 04/16/2016   Procedure: IRRIGATION AND DEBRIDEMENT EXTREMITY/GREAT TOE AMP.;  Surgeon: Felecia Shelling, DPM;  Location: [facility];  Service: Podiatry;  Laterality: Left;   JOINT REPLACEMENT     LAPAROSCOPIC NEPHRECTOMY, HAND ASSISTED Left 07/11/2015   Procedure: HAND ASSISTED LAPAROSCOPIC NEPHRECTOMY;  Surgeon: Vanna Scotland, MD;  Location: [facility];  Service: Urology;  Laterality: Left;   PERIPHERAL VASCULAR CATHETERIZATION Left 12/06/2014   Procedure: A/V Shuntogram/Fistulagram;  Surgeon: Renford Dills, MD;  Location: [facility];  Service: Cardiovascular;  Laterality: Left;   PERIPHERAL VASCULAR CATHETERIZATION Left 12/06/2014   Procedure: A/V Shunt Intervention;  Surgeon: Earl Lites  Wynonia Lawman, MD;  Location: [facility];  Service: Cardiovascular;  Laterality: Left;   TONSILLECTOMY      Allergies  Allergen Reactions   Ferric Citrate Nausea And Vomiting    Allergies as of 07/26/2022       Reactions   Ferric Citrate Nausea And Vomiting        Medication List        Accurate as of July 26, 2022  9:13 AM. If you have any questions, ask your nurse or doctor.          Accu-Chek Aviva Plus test strip Generic drug: glucose blood Use to test blood sugar three times daily. Dx:E11.40   Accu-Chek Aviva Plus w/Device Kit Use to test blood sugar three times daily. Dx: E11.40    Accu-Chek Softclix Lancets lancets Test blood sugar 3 times a day. DX: E11.40   acetaminophen 500 MG tablet Commonly known as: TYLENOL Take 500 mg by mouth every 6 (six) hours as needed for moderate pain or mild pain.   allopurinol 100 MG tablet Commonly known as: ZYLOPRIM Take 100 mg by mouth every other day.   amLODipine 10 MG tablet Commonly known as: NORVASC Take 10 mg by mouth in the morning. 9am   aspirin EC 81 MG tablet Take 81 mg by mouth daily. Swallow whole.   atorvastatin 40 MG tablet Commonly known as: LIPITOR Take 40 mg by mouth daily. 9pm.   BD Pen Needle Nano 2nd Gen 32G X 4 MM Misc Generic drug: Insulin Pen Needle Use to test blood sugar four times daily. Dx:E11.40   carvedilol 12.5 MG tablet Commonly known as: COREG Take 37.5 mg by mouth in the morning, at noon, and at bedtime. 9am , and 9pm   chlorthalidone 25 MG tablet Commonly known as: HYGROTON Take 25 mg by mouth daily.   ergocalciferol 1.25 MG (50000 UT) capsule Commonly known as: VITAMIN D2 Take 50,000 Units by mouth once a week.   furosemide 80 MG tablet Commonly known as: LASIX Take 80 mg by mouth as needed for edema or fluid.   hydrALAZINE 25 MG tablet Commonly known as: APRESOLINE Take 75 mg by mouth 3 (three) times daily.   insulin glargine 100 UNIT/ML Solostar Pen Commonly known as: LANTUS Inject 35 Units into the skin daily. 35 units daily before breakfast   insulin lispro 100 UNIT/ML KwikPen Commonly known as: HUMALOG .03-0.17 mLs (3-17 Units total) into the skin 2 (two) times daily as needed for high blood sugar E11.40   Iron (Ferrous Sulfate) 325 (65 Fe) MG Tabs Take 325 mg by mouth daily.   Jardiance 10 MG Tabs tablet Generic drug: empagliflozin Take 10 mg by mouth daily.   levothyroxine 75 MCG tablet Commonly known as: SYNTHROID Take 75 mcg by mouth daily before breakfast. 7am   mycophenolate 180 MG EC tablet Commonly known as: MYFORTIC Take 3 tablets (540 mg  total) by mouth in the morning and at bedtime. 3 tablets (540 mg total per dose) at 9am and 9pm   Ozempic (1 MG/DOSE) 4 MG/3ML Sopn Generic drug: Semaglutide (1 MG/DOSE) INJECT 1 MG INTO THE SKIN ONCE A WEEK. 9AM   spironolactone 25 MG tablet Commonly known as: ALDACTONE Take 50 mg by mouth every morning.   tacrolimus ER 1 MG Tb24 Commonly known as: ENVARSUS XR Take 6 mg by mouth daily before breakfast.        Review of Systems  Constitutional:  Negative for appetite change, chills, fatigue, fever and  unexpected weight change.  Respiratory:  Negative for cough, chest tightness, shortness of breath and wheezing.   Cardiovascular:  Negative for chest pain, palpitations and leg swelling.  Endocrine: Negative for cold intolerance, heat intolerance, polydipsia, polyphagia and polyuria.  Musculoskeletal:  Negative for arthralgias, back pain, gait problem and joint swelling.  Skin:  Positive for wound. Negative for color change, pallor and rash.       Left pinky finger dark spot post burn unclear whether sustained while frying food or oil spilled on finger  Neurological:  Negative for dizziness, weakness, light-headedness and headaches.       Hx of neuropathy     Immunization History  Administered Date(s) Administered   Covid-19, Mrna,Vaccine(Spikevax)28yrs and older 04/19/2022   Hepatitis B, ADULT 12/30/2014, 01/27/2015, 03/01/2015, 06/30/2015   Influenza Split 07/31/2011, 01/31/2020   Influenza,inj,Quad PF,6+ Mos 01/19/2020, 12/27/2020, 04/26/2021, 01/16/2022   Influenza-Unspecified 01/19/2020   PFIZER(Purple Top)SARS-COV-2 Vaccination 06/11/2019, 07/07/2019, 12/31/2019, 01/31/2020, 08/31/2020, 10/11/2020   PNEUMOCOCCAL CONJUGATE-20 01/16/2022   Pfizer Covid-19 Vaccine Bivalent Booster 48yrs & up 04/26/2021   Pneumococcal Polysaccharide-23 05/22/2015   Tdap 03/01/2017   Pertinent  Health Maintenance Due  Topic Date Due   FOOT EXAM  Never done   HEMOGLOBIN A1C  04/21/2022    INFLUENZA VACCINE  10/31/2022   OPHTHALMOLOGY EXAM  07/23/2023   COLONOSCOPY (Pts 45-87yrs Insurance coverage will need to be confirmed)  11/14/2031      03/23/2022    8:00 PM 03/24/2022    9:00 AM 03/24/2022    9:57 PM 04/26/2022   10:22 AM 07/25/2022   12:57 PM  Fall Risk  Falls in the past year?    0 0  Was there an injury with Fall?    0 0  Fall Risk Category Calculator    0 0  (RETIRED) Patient Fall Risk Level Moderate fall risk High fall risk High fall risk    Patient at Risk for Falls Due to    No Fall Risks No Fall Risks  Fall risk Follow up    Falls evaluation completed    Functional Status Survey:    Vitals:   07/26/22 0855  BP: (!) 148/70  Pulse: 78  Resp: 16  Temp: 98.3 F (36.8 C)  TempSrc: Temporal  SpO2: 98%  Weight: 230 lb (104.3 kg)  Height: 5\' 8"  (1.727 m)   Body mass index is 34.97 kg/m. Physical Exam Vitals reviewed.  Constitutional:      General: He is not in acute distress.    Appearance: Normal appearance. He is normal weight. He is not ill-appearing or diaphoretic.  Eyes:     General: No scleral icterus.       Right eye: No discharge.        Left eye: No discharge.     Conjunctiva/sclera: Conjunctivae normal.     Pupils: Pupils are equal, round, and reactive to light.  Cardiovascular:     Rate and Rhythm: Normal rate and regular rhythm.     Pulses: Normal pulses.     Heart sounds: Normal heart sounds. No murmur heard.    No friction rub. No gallop.  Pulmonary:     Effort: Pulmonary effort is normal. No respiratory distress.     Breath sounds: Normal breath sounds. No wheezing, rhonchi or rales.  Chest:     Chest wall: No tenderness.  Skin:    General: Skin is warm and dry.     Coloration: Skin is not pale.     Findings:  No bruising, erythema, lesion or rash.     Comments: Left pinky finger dime size dark area with surrounding erythema surrounding skin with hard callus area non-tender to touch.No drainage noted.   Neurological:      Mental Status: He is alert and oriented to person, place, and time.     Sensory: No sensory deficit.     Motor: No weakness.     Coordination: Coordination normal.     Gait: Gait normal.  Psychiatric:        Mood and Affect: Mood normal.        Speech: Speech normal.        Behavior: Behavior normal.    Labs reviewed: Recent Labs    05/07/22 0830 06/07/22 0853 06/17/22 0825 07/15/22 0831  NA 140 137 137 135  K 4.4 5.0 4.8 4.5  CL 107 104 109 104  CO2 23 23 21* 21*  GLUCOSE 144* 152* 149* 149*  BUN 62* 60* 62* 58*  CREATININE 2.50* 3.03* 2.56* 2.51*  CALCIUM 9.2 9.2 9.2 9.2  MG 2.0 2.0  --  2.0  PHOS 4.8* 5.6* 4.6 4.4   Recent Labs    10/03/21 0000 03/22/22 1006 03/23/22 0333  AST 14 27 26   ALT 7* 29 28  ALKPHOS 120 67 61  BILITOT  --  0.9 0.9  PROT  --  6.6 6.1*  ALBUMIN 4.1 3.3* 2.8*   Recent Labs    05/07/22 0830 06/07/22 0853 07/15/22 0831  WBC 5.3 5.6 9.4  NEUTROABS 3.7 3.7 7.6  HGB 11.6* 12.4* 11.1*  HCT 39.9 43.0 37.4*  MCV 84.2 83.8 83.5  PLT 205 197 181   Lab Results  Component Value Date   TSH 1.44 10/19/2021   Lab Results  Component Value Date   HGBA1C 6.3 (H) 10/19/2021   Lab Results  Component Value Date   CHOL 59 10/19/2021   HDL 25 (L) 10/19/2021   LDLCALC 16 10/19/2021   TRIG 99 10/19/2021   CHOLHDL 2.4 10/19/2021    Significant Diagnostic Results in last 30 days:  No results found.  Assessment/Plan 1. Cellulitis of finger, left Afebrile  Left pinky finger dime size dark area with surrounding erythema surrounding skin with hard callus area non-tender to touch.No drainage noted. - start on Doxycycline as below.side effects discussed - doxycycline (VIBRA-TABS) 100 MG tablet; Take 1 tablet (100 mg total) by mouth 2 (two) times daily for 7 days.  Dispense: 14 tablet; Refill: 0  2. Superficial burn of finger of left hand, initial encounter Unclear if burned while deep frying food. - doxycycline (VIBRA-TABS) 100 MG tablet;  Take 1 tablet (100 mg total) by mouth 2 (two) times daily for 7 days.  Dispense: 14 tablet; Refill: 0  3. Type 2 diabetes mellitus with diabetic neuropathy, with long-term current use of insulin (HCC) Lab Results  Component Value Date   HGBA1C 6.3 (H) 10/19/2021  CBG 143  - continue on Jardiance, Ozempic,Humalog and Lantus  - Insulin Pen Needle (BD PEN NEEDLE NANO 2ND GEN) 32G X 4 MM MISC; Use to test blood sugar four times daily. Dx:E11.40  Dispense: 400 each; Refill: 5 - glucose blood (ACCU-CHEK AVIVA PLUS) test strip; Use to test blood sugar three times daily. Dx:E11.40  Dispense: 300 each; Refill: 11  Family/ staff Communication: Reviewed plan of care with patient verbalized understanding   Labs/tests ordered: None   Next Appointment : Return if symptoms worsen or fail to improve.  Caesar Bookman, NP

## 2022-07-26 NOTE — Patient Instructions (Addendum)
.  cleanse with saline or warm water and antibacterial soap  ,pat dry, triple antibiotic ointment applied and covered with Band aid for extra protection and absorption.change dressing daily.Notify provider if symptoms worsen or fail to improve.

## 2022-08-08 MED FILL — ATORVASTATIN 40 MG TABLET: ORAL | 90 days supply | Qty: 90 | Fill #1

## 2022-08-08 MED FILL — MYCOPHENOLATE SODIUM 180 MG TABLET,DELAYED RELEASE: ORAL | 30 days supply | Qty: 180 | Fill #7

## 2022-08-08 MED FILL — LEVOTHYROXINE 75 MCG TABLET: ORAL | 90 days supply | Qty: 90 | Fill #2

## 2022-08-08 MED FILL — GABAPENTIN 100 MG CAPSULE: ORAL | 30 days supply | Qty: 60 | Fill #6

## 2022-08-12 ENCOUNTER — Other Ambulatory Visit: Payer: Self-pay | Admitting: Family

## 2022-08-12 DIAGNOSIS — E1140 Type 2 diabetes mellitus with diabetic neuropathy, unspecified: Secondary | ICD-10-CM

## 2022-08-27 DIAGNOSIS — R82998 Other abnormal findings in urine: Principal | ICD-10-CM

## 2022-08-27 DIAGNOSIS — R7989 Other specified abnormal findings of blood chemistry: Principal | ICD-10-CM

## 2022-08-27 DIAGNOSIS — Z940 Kidney transplant status: Principal | ICD-10-CM

## 2022-08-28 ENCOUNTER — Ambulatory Visit: Admit: 2022-08-28 | Discharge: 2022-08-29 | Payer: MEDICARE

## 2022-08-28 DIAGNOSIS — I701 Atherosclerosis of renal artery: Secondary | ICD-10-CM | POA: Diagnosis not present

## 2022-08-28 DIAGNOSIS — T8619 Other complication of kidney transplant: Secondary | ICD-10-CM | POA: Diagnosis not present

## 2022-08-28 DIAGNOSIS — Z79899 Other long term (current) drug therapy: Secondary | ICD-10-CM | POA: Diagnosis not present

## 2022-08-28 DIAGNOSIS — R7989 Other specified abnormal findings of blood chemistry: Secondary | ICD-10-CM | POA: Diagnosis not present

## 2022-08-28 DIAGNOSIS — R82998 Other abnormal findings in urine: Secondary | ICD-10-CM | POA: Diagnosis not present

## 2022-08-28 DIAGNOSIS — D631 Anemia in chronic kidney disease: Secondary | ICD-10-CM | POA: Diagnosis not present

## 2022-08-28 DIAGNOSIS — N200 Calculus of kidney: Secondary | ICD-10-CM | POA: Diagnosis not present

## 2022-08-28 DIAGNOSIS — Z940 Kidney transplant status: Secondary | ICD-10-CM | POA: Diagnosis not present

## 2022-08-28 DIAGNOSIS — E213 Hyperparathyroidism, unspecified: Secondary | ICD-10-CM | POA: Diagnosis not present

## 2022-08-28 DIAGNOSIS — I151 Hypertension secondary to other renal disorders: Secondary | ICD-10-CM | POA: Diagnosis not present

## 2022-08-28 DIAGNOSIS — N2581 Secondary hyperparathyroidism of renal origin: Secondary | ICD-10-CM | POA: Diagnosis not present

## 2022-08-28 DIAGNOSIS — Z4822 Encounter for aftercare following kidney transplant: Secondary | ICD-10-CM | POA: Diagnosis not present

## 2022-08-28 DIAGNOSIS — N189 Chronic kidney disease, unspecified: Secondary | ICD-10-CM | POA: Diagnosis not present

## 2022-08-28 MED ORDER — HYDRALAZINE 100 MG TABLET
ORAL_TABLET | Freq: Two times a day (BID) | ORAL | 3 refills | 90 days | Status: CP
Start: 2022-08-28 — End: 2023-08-28
  Filled 2022-09-02: qty 180, 90d supply, fill #0

## 2022-08-28 MED ORDER — ASPIRIN 81 MG TABLET,DELAYED RELEASE
ORAL_TABLET | Freq: Every day | ORAL | 11 refills | 30 days | Status: CP
Start: 2022-08-28 — End: 2023-08-28

## 2022-08-28 MED ORDER — TACROLIMUS XR 1 MG TABLET,EXTENDED RELEASE 24 HR
ORAL_TABLET | Freq: Every day | ORAL | 3 refills | 90 days | Status: CP
Start: 2022-08-28 — End: 2023-08-28

## 2022-09-02 MED FILL — CHLORTHALIDONE 25 MG TABLET: ORAL | 90 days supply | Qty: 180 | Fill #3

## 2022-09-02 MED FILL — JARDIANCE 25 MG TABLET: ORAL | 90 days supply | Qty: 90 | Fill #2

## 2022-09-02 MED FILL — AMLODIPINE 10 MG TABLET: ORAL | 90 days supply | Qty: 90 | Fill #2

## 2022-09-02 MED FILL — GABAPENTIN 100 MG CAPSULE: ORAL | 30 days supply | Qty: 60 | Fill #7

## 2022-09-02 MED FILL — ALLOPURINOL 100 MG TABLET: ORAL | 90 days supply | Qty: 45 | Fill #3

## 2022-09-02 MED FILL — MYCOPHENOLATE SODIUM 180 MG TABLET,DELAYED RELEASE: ORAL | 30 days supply | Qty: 180 | Fill #8

## 2022-09-06 DIAGNOSIS — Z89511 Acquired absence of right leg below knee: Secondary | ICD-10-CM | POA: Diagnosis not present

## 2022-09-10 DIAGNOSIS — G4733 Obstructive sleep apnea (adult) (pediatric): Secondary | ICD-10-CM | POA: Diagnosis not present

## 2022-09-11 ENCOUNTER — Other Ambulatory Visit (HOSPITAL_COMMUNITY)
Admission: RE | Admit: 2022-09-11 | Discharge: 2022-09-11 | Disposition: A | Discharge status: Home / Self Care | Payer: 59 | Source: Ambulatory Visit | Attending: Pediatric Nephrology | Admitting: Pediatric Nephrology

## 2022-09-11 DIAGNOSIS — D899 Disorder involving the immune mechanism, unspecified: Secondary | ICD-10-CM | POA: Diagnosis not present

## 2022-09-11 DIAGNOSIS — N200 Calculus of kidney: Secondary | ICD-10-CM | POA: Diagnosis not present

## 2022-09-11 DIAGNOSIS — Z940 Kidney transplant status: Secondary | ICD-10-CM | POA: Diagnosis not present

## 2022-09-11 DIAGNOSIS — N2581 Secondary hyperparathyroidism of renal origin: Secondary | ICD-10-CM | POA: Diagnosis not present

## 2022-09-11 LAB — CBC WITH DIFFERENTIAL/PLATELET
Abs Immature Granulocytes: 0.08 10*3/uL — ABNORMAL HIGH (ref 0.00–0.07)
Basophils Absolute: 0 10*3/uL (ref 0.0–0.1)
Basophils Relative: 0 %
Eosinophils Absolute: 0.1 10*3/uL (ref 0.0–0.5)
Eosinophils Relative: 1 %
HCT: 37.4 % — ABNORMAL LOW (ref 39.0–52.0)
Hemoglobin: 10.4 g/dL — ABNORMAL LOW (ref 13.0–17.0)
Immature Granulocytes: 1 %
Lymphocytes Relative: 10 %
Lymphs Abs: 1.1 10*3/uL (ref 0.7–4.0)
MCH: 24.9 pg — ABNORMAL LOW (ref 26.0–34.0)
MCHC: 27.8 g/dL — ABNORMAL LOW (ref 30.0–36.0)
MCV: 89.5 fL (ref 80.0–100.0)
Monocytes Absolute: 0.8 10*3/uL (ref 0.1–1.0)
Monocytes Relative: 7 %
Neutro Abs: 8.8 10*3/uL — ABNORMAL HIGH (ref 1.7–7.7)
Neutrophils Relative %: 81 %
Platelets: 178 10*3/uL (ref 150–400)
RBC: 4.18 MIL/uL — ABNORMAL LOW (ref 4.22–5.81)
RDW: 14.9 % (ref 11.5–15.5)
WBC: 10.8 10*3/uL — ABNORMAL HIGH (ref 4.0–10.5)
nRBC: 0 % (ref 0.0–0.2)

## 2022-09-11 LAB — BASIC METABOLIC PANEL
Anion gap: 7 (ref 5–15)
BUN: 51 mg/dL — ABNORMAL HIGH (ref 6–20)
CO2: 21 mmol/L — ABNORMAL LOW (ref 22–32)
Calcium: 8.8 mg/dL — ABNORMAL LOW (ref 8.9–10.3)
Chloride: 111 mmol/L (ref 98–111)
Creatinine, Ser: 2.46 mg/dL — ABNORMAL HIGH (ref 0.61–1.24)
GFR, Estimated: 30 mL/min — ABNORMAL LOW (ref >60)
Glucose, Bld: 157 mg/dL — ABNORMAL HIGH (ref 70–99)
Potassium: 4.3 mmol/L (ref 3.5–5.1)
Sodium: 139 mmol/L (ref 135–145)

## 2022-09-11 LAB — MAGNESIUM: Magnesium: 1.9 mg/dL (ref 1.7–2.4)

## 2022-09-11 LAB — PHOSPHORUS: Phosphorus: 4.6 mg/dL (ref 2.5–4.6)

## 2022-09-13 LAB — TACROLIMUS LEVEL: Tacrolimus (FK506) - LabCorp: 5.9 ng/mL (ref 2.0–20.0)

## 2022-09-20 ENCOUNTER — Other Ambulatory Visit: Payer: Self-pay | Admitting: Family

## 2022-09-20 DIAGNOSIS — Z794 Long term (current) use of insulin: Secondary | ICD-10-CM

## 2022-09-26 ENCOUNTER — Telehealth: Payer: Self-pay

## 2022-09-26 DIAGNOSIS — E1140 Type 2 diabetes mellitus with diabetic neuropathy, unspecified: Secondary | ICD-10-CM

## 2022-09-26 MED ORDER — ERGOCALCIFEROL (VITAMIN D2) 1,250 MCG (50,000 UNIT) CAPSULE
ORAL_CAPSULE | ORAL | 0 refills | 84 days
Start: 2022-09-26 — End: 2022-12-25

## 2022-09-26 MED ORDER — OZEMPIC (1 MG/DOSE) 4 MG/3ML ~~LOC~~ SOPN
PEN_INJECTOR | SUBCUTANEOUS | 1 refills | Status: DC
Start: 2022-09-26 — End: 2022-11-29

## 2022-09-26 NOTE — Telephone Encounter (Signed)
Patient called for a refill on ozempic and medication was send into pharmacy.

## 2022-10-07 MED FILL — CARVEDILOL 12.5 MG TABLET: ORAL | 90 days supply | Qty: 540 | Fill #2

## 2022-10-07 MED FILL — MYCOPHENOLATE SODIUM 180 MG TABLET,DELAYED RELEASE: ORAL | 30 days supply | Qty: 180 | Fill #9

## 2022-10-10 ENCOUNTER — Other Ambulatory Visit (HOSPITAL_COMMUNITY)
Admission: RE | Admit: 2022-10-10 | Discharge: 2022-10-10 | Disposition: A | Discharge status: Home / Self Care | Payer: 59 | Source: Ambulatory Visit | Attending: Pediatric Nephrology | Admitting: Pediatric Nephrology

## 2022-10-10 DIAGNOSIS — Z09 Encounter for follow-up examination after completed treatment for conditions other than malignant neoplasm: Secondary | ICD-10-CM | POA: Insufficient documentation

## 2022-10-10 DIAGNOSIS — B259 Cytomegaloviral disease, unspecified: Secondary | ICD-10-CM | POA: Diagnosis not present

## 2022-10-10 DIAGNOSIS — D899 Disorder involving the immune mechanism, unspecified: Secondary | ICD-10-CM | POA: Insufficient documentation

## 2022-10-10 DIAGNOSIS — E1129 Type 2 diabetes mellitus with other diabetic kidney complication: Secondary | ICD-10-CM | POA: Insufficient documentation

## 2022-10-10 DIAGNOSIS — D631 Anemia in chronic kidney disease: Secondary | ICD-10-CM | POA: Insufficient documentation

## 2022-10-10 DIAGNOSIS — E559 Vitamin D deficiency, unspecified: Secondary | ICD-10-CM | POA: Diagnosis not present

## 2022-10-10 DIAGNOSIS — Z114 Encounter for screening for human immunodeficiency virus [HIV]: Secondary | ICD-10-CM | POA: Insufficient documentation

## 2022-10-10 DIAGNOSIS — Z940 Kidney transplant status: Secondary | ICD-10-CM | POA: Diagnosis not present

## 2022-10-10 DIAGNOSIS — Z789 Other specified health status: Secondary | ICD-10-CM | POA: Diagnosis not present

## 2022-10-10 DIAGNOSIS — N390 Urinary tract infection, site not specified: Secondary | ICD-10-CM | POA: Diagnosis not present

## 2022-10-10 DIAGNOSIS — Z9483 Pancreas transplant status: Secondary | ICD-10-CM | POA: Insufficient documentation

## 2022-10-10 DIAGNOSIS — Z79899 Other long term (current) drug therapy: Secondary | ICD-10-CM | POA: Diagnosis not present

## 2022-10-10 LAB — BASIC METABOLIC PANEL
Anion gap: 9 (ref 5–15)
BUN: 57 mg/dL — ABNORMAL HIGH (ref 6–20)
CO2: 19 mmol/L — ABNORMAL LOW (ref 22–32)
Calcium: 8.8 mg/dL — ABNORMAL LOW (ref 8.9–10.3)
Chloride: 109 mmol/L (ref 98–111)
Creatinine, Ser: 2.54 mg/dL — ABNORMAL HIGH (ref 0.61–1.24)
GFR, Estimated: 29 mL/min — ABNORMAL LOW (ref >60)
Glucose, Bld: 137 mg/dL — ABNORMAL HIGH (ref 70–99)
Potassium: 4.2 mmol/L (ref 3.5–5.1)
Sodium: 137 mmol/L (ref 135–145)

## 2022-10-10 LAB — CBC WITH DIFFERENTIAL/PLATELET
Abs Immature Granulocytes: 0.06 10*3/uL (ref 0.00–0.07)
Basophils Absolute: 0 10*3/uL (ref 0.0–0.1)
Basophils Relative: 0 %
Eosinophils Absolute: 0.1 10*3/uL (ref 0.0–0.5)
Eosinophils Relative: 1 %
HCT: 34.9 % — ABNORMAL LOW (ref 39.0–52.0)
Hemoglobin: 10.2 g/dL — ABNORMAL LOW (ref 13.0–17.0)
Immature Granulocytes: 1 %
Lymphocytes Relative: 9 %
Lymphs Abs: 1 10*3/uL (ref 0.7–4.0)
MCH: 25.1 pg — ABNORMAL LOW (ref 26.0–34.0)
MCHC: 29.2 g/dL — ABNORMAL LOW (ref 30.0–36.0)
MCV: 86 fL (ref 80.0–100.0)
Monocytes Absolute: 0.9 10*3/uL (ref 0.1–1.0)
Monocytes Relative: 9 %
Neutro Abs: 8.7 10*3/uL — ABNORMAL HIGH (ref 1.7–7.7)
Neutrophils Relative %: 80 %
Platelets: 177 10*3/uL (ref 150–400)
RBC: 4.06 MIL/uL — ABNORMAL LOW (ref 4.22–5.81)
RDW: 14.4 % (ref 11.5–15.5)
WBC: 10.7 10*3/uL — ABNORMAL HIGH (ref 4.0–10.5)
nRBC: 0 % (ref 0.0–0.2)

## 2022-10-10 LAB — MAGNESIUM: Magnesium: 1.8 mg/dL (ref 1.7–2.4)

## 2022-10-10 LAB — PHOSPHORUS: Phosphorus: 4.6 mg/dL (ref 2.5–4.6)

## 2022-10-12 DIAGNOSIS — M792 Neuralgia and neuritis, unspecified: Principal | ICD-10-CM

## 2022-10-12 LAB — TACROLIMUS LEVEL: Tacrolimus (FK506) - LabCorp: 6.5 ng/mL (ref 2.0–20.0)

## 2022-10-12 MED ORDER — GABAPENTIN 100 MG CAPSULE
ORAL_CAPSULE | Freq: Two times a day (BID) | ORAL | 0 refills | 30 days
Start: 2022-10-12 — End: 2022-11-11

## 2022-10-14 ENCOUNTER — Other Ambulatory Visit: Payer: Self-pay | Admitting: Family

## 2022-10-14 DIAGNOSIS — E1140 Type 2 diabetes mellitus with diabetic neuropathy, unspecified: Secondary | ICD-10-CM

## 2022-10-14 LAB — URINALYSIS, ROUTINE W REFLEX MICROSCOPIC
Bilirubin Urine: NEGATIVE
Glucose, UA: >=500 mg/dL — AB
Hgb urine dipstick: NEGATIVE
Ketones, ur: NEGATIVE mg/dL
Leukocytes,Ua: NEGATIVE
Nitrite: NEGATIVE
Protein, ur: NEGATIVE mg/dL
Specific Gravity, Urine: 1.014 (ref 1.005–1.030)
pH: 5 (ref 5.0–8.0)

## 2022-10-14 MED ORDER — ERGOCALCIFEROL (VITAMIN D2) 1,250 MCG (50,000 UNIT) CAPSULE
ORAL_CAPSULE | ORAL | 0 refills | 84.00000 days
Start: 2022-10-14 — End: 2023-01-12

## 2022-10-15 LAB — URINE CULTURE: Culture: <10000 — AB

## 2022-10-17 MED FILL — SPIRONOLACTONE 25 MG TABLET: ORAL | 90 days supply | Qty: 180 | Fill #2

## 2022-10-17 MED FILL — GABAPENTIN 100 MG CAPSULE: ORAL | 30 days supply | Qty: 60 | Fill #8

## 2022-10-25 ENCOUNTER — Ambulatory Visit (INDEPENDENT_AMBULATORY_CARE_PROVIDER_SITE_OTHER): Payer: 59 | Admitting: Family

## 2022-10-25 ENCOUNTER — Encounter: Payer: Self-pay | Admitting: Family

## 2022-10-25 VITALS — BP 138/78 | HR 78 | Temp 97.7°F | Resp 18 | Ht 68.0 in | Wt 228.0 lb

## 2022-10-25 DIAGNOSIS — Z794 Long term (current) use of insulin: Secondary | ICD-10-CM

## 2022-10-25 DIAGNOSIS — E785 Hyperlipidemia, unspecified: Secondary | ICD-10-CM

## 2022-10-25 DIAGNOSIS — E039 Hypothyroidism, unspecified: Secondary | ICD-10-CM

## 2022-10-25 DIAGNOSIS — E1140 Type 2 diabetes mellitus with diabetic neuropathy, unspecified: Secondary | ICD-10-CM

## 2022-10-25 DIAGNOSIS — D72829 Elevated white blood cell count, unspecified: Secondary | ICD-10-CM

## 2022-10-25 LAB — CBC WITH DIFFERENTIAL/PLATELET
Absolute Monocytes: 741 cells/uL (ref 200–950)
Basophils Absolute: 46 cells/uL (ref 0–200)
Basophils Relative: 0.4 %
Eosinophils Absolute: 68 cells/uL (ref 15–500)
Eosinophils Relative: 0.6 %
HCT: 34.4 % — ABNORMAL LOW (ref 38.5–50.0)
Hemoglobin: 10.1 g/dL — ABNORMAL LOW (ref 13.2–17.1)
Lymphs Abs: 844 cells/uL — ABNORMAL LOW (ref 850–3900)
MCH: 24.5 pg — ABNORMAL LOW (ref 27.0–33.0)
MCHC: 29.4 g/dL — ABNORMAL LOW (ref 32.0–36.0)
MCV: 83.5 fL (ref 80.0–100.0)
MPV: 10.4 fL (ref 7.5–12.5)
Monocytes Relative: 6.5 %
Neutro Abs: 9701 cells/uL — ABNORMAL HIGH (ref 1500–7800)
Neutrophils Relative %: 85.1 %
Platelets: 207 10*3/uL (ref 140–400)
RBC: 4.12 10*6/uL — ABNORMAL LOW (ref 4.20–5.80)
RDW: 13.4 % (ref 11.0–15.0)
Total Lymphocyte: 7.4 %
WBC: 11.4 10*3/uL — ABNORMAL HIGH (ref 3.8–10.8)

## 2022-10-25 MED ORDER — ACCU-CHEK AVIVA PLUS VI STRP
ORAL_STRIP | 11 refills | Status: DC
Start: 2022-10-25 — End: 2022-11-01

## 2022-10-25 NOTE — Progress Notes (Signed)
Provider: Richarda Blade FNP-C   , Donalee Citrin, NP  Patient Care Team: , Donalee Citrin, NP as PCP - General (Family Medicine) Jodelle Red, MD as PCP - Cardiology (Cardiology) Nadara Mustard, MD as Consulting Physician (Orthopedic Surgery) Felecia Shelling, DPM as Consulting Physician (Podiatry) [facility]  Extended Emergency Contact Information Primary Emergency Contact: Becker,Tim Address: [address] [place], [place] [address] [place] Home Phone: [phone] Mobile Phone: [phone] Relation: Spouse  Code Status:  Full Code  Goals of care: Advanced Directive information    07/25/2022   12:57 PM  Advanced Directives  Does Patient Have a Medical Advance Directive? No  Would patient like information on creating a medical advance directive? No - Patient declined     Chief Complaint  Patient presents with   Medical Management of Chronic Issues    Patient is here for a 71M follow up for chronic conditions    Quality Metric Gaps    Patient is due for foot exam and A1C, covid booster And discuss need for shingrix vaccine    HPI:  Pt is a 57 y.o. Becker seen today for 6 months follow up for medical management of chronic diseases.   Hgb A1C 6.7  states CBG runs 130's -170's or higher depending on snacks.  Follows up with Dr.Groat for eye exam.Had cataract surgery last year.No retinopathy.Has corrective lens but has not worn them.Has not required any readers either.  Eating habits has changed he eats twice daily. Has had 2 lbs weight loss.  He saw podiatrist early this year.Has upcoming appointment. No numbness or tingling on feet.  Still adjusting to right BKA prosthetic.   WBC 10.7   Hgb 10.2   CR 2.54 ,BUN 57 follows up with Nephrologist.   Past Medical History:  Diagnosis Date   Anemia    Arthritis    Cancer (HCC)    Renal Tumor   CKD (chronic kidney disease)    Coronary artery disease     Diabetes (HCC)    DM (diabetes mellitus) with complications (HCC)    ESRD (end stage renal disease) (HCC)    Pre- dialysis   History of colonoscopy 2011   Hypertension    Kidney transplanted 2022   Left kidney mass    Neuropathy    Obesity    Osteomyelitis, chronic, ankle or foot (HCC)    PONV (postoperative nausea and vomiting)    Renal insufficiency    Patient is on Dialysis and receives M,W and F.   S/P BKA (below knee amputation) (HCC) 08/2014   Right , Tim Becker, [place]   Sleep apnea    CPAP   Thyroid disease    Past Surgical History:  Procedure Laterality Date   BASCILIC VEIN TRANSPOSITION Left 02/17/2015   Procedure: BASCILIC VEIN TRANSPOSITION;  Surgeon: Renford Dills, MD;  Location: [facility] ORS;  Service: Vascular;  Laterality: Left;   BELOW KNEE LEG AMPUTATION Right    CORONARY ANGIOPLASTY WITH STENT PLACEMENT     EYE SURGERY     FOOT SURGERY     Multiple R foot surgery for infection and charcot foot   I & D EXTREMITY Left 04/16/2016   Procedure: IRRIGATION AND DEBRIDEMENT EXTREMITY/GREAT TOE AMP.;  Surgeon: Felecia Shelling, DPM;  Location: [facility] OR;  Service: Podiatry;  Laterality: Left;   JOINT REPLACEMENT     LAPAROSCOPIC NEPHRECTOMY, HAND ASSISTED Left 07/11/2015  Procedure: HAND ASSISTED LAPAROSCOPIC NEPHRECTOMY;  Surgeon: Vanna Scotland, MD;  Location: [facility] OR[facility];  Service: Urology;  Laterality: Left;   PERIPHERAL VASCULAR CATHETERIZATION Left 12/06/2014   Procedure: A/V Shuntogram/Fistulagram;  Surgeon: Renford Dills, MD;  Location: [facility] INVASIVE CV LAB;  Service: Cardiovascular;  Laterality: Left;   PERIPHERAL VASCULAR CATHETERIZATION Left 12/06/2014   Procedure: A/V Shunt Intervention;  Surgeon: Renford Dills, MD;  Location: [facility];  Service: Cardiovascular;  Laterality: Left;   TONSILLECTOMY      Allergies  Allergen Reactions   Ferric Citrate Nausea And Vomiting    Allergies as of 10/25/2022       Reactions   Ferric Citrate Nausea And  Vomiting        Medication List        Accurate as of October 25, 2022  9:35 AM. If you have any questions, ask your nurse or doctor.          Accu-Chek Aviva Plus test strip Generic drug: glucose blood Use to test blood sugar three times daily. Dx:E11.40   Accu-Chek Aviva Plus w/Device Kit Use to test blood sugar three times daily. Dx: E11.40   Accu-Chek Softclix Lancets lancets Test blood sugar 3 times a day. DX: E11.40   acetaminophen 500 MG tablet Commonly known as: TYLENOL Take 500 mg by mouth every 6 (six) hours as needed for moderate pain or mild pain.   allopurinol 100 MG tablet Commonly known as: ZYLOPRIM Take 100 mg by mouth every other day.   amLODipine 10 MG tablet Commonly known as: NORVASC Take 10 mg by mouth in the morning. 9am   aspirin EC 81 MG tablet Take 81 mg by mouth daily. Swallow whole.   atorvastatin 40 MG tablet Commonly known as: LIPITOR Take 40 mg by mouth daily. 9pm.   BD Pen Needle Nano 2nd Gen 32G X 4 MM Misc Generic drug: Insulin Pen Needle Use to test blood sugar four times daily. Dx:E11.40   carvedilol 12.5 MG tablet Commonly known as: COREG Take 37.5 mg by mouth in the morning, at noon, and at bedtime. 9am , and 9pm   chlorthalidone 25 MG tablet Commonly known as: HYGROTON Take 25 mg by mouth daily.   ergocalciferol 1.25 MG (50000 UT) capsule Commonly known as: VITAMIN D2 Take 50,000 Units by mouth once a week.   furosemide 80 MG tablet Commonly known as: LASIX Take 80 mg by mouth as needed for edema or fluid.   hydrALAZINE 25 MG tablet Commonly known as: APRESOLINE Take 100 mg by mouth in the morning and at bedtime.   insulin glargine 100 UNIT/ML Solostar Pen Commonly known as: LANTUS Inject 35 Units into the skin daily. 35 units daily before breakfast   insulin lispro 100 UNIT/ML KwikPen Commonly known as: HUMALOG .03-0.17 mLs (3-17 Units total) into the skin 2 (two) times daily as needed for high blood sugar  E11.40   Iron (Ferrous Sulfate) 325 (65 Fe) MG Tabs Take 325 mg by mouth daily.   Jardiance 10 MG Tabs tablet Generic drug: empagliflozin Take 10 mg by mouth daily.   levothyroxine 75 MCG tablet Commonly known as: SYNTHROID Take 75 mcg by mouth daily before breakfast. 7am   mycophenolate 180 MG EC tablet Commonly known as: MYFORTIC Take 3 tablets (540 mg total) by mouth in the morning and at bedtime. 3 tablets (540 mg total per dose) at 9am and 9pm   Ozempic (1 MG/DOSE) 4 MG/3ML Sopn Generic drug: Semaglutide (1 MG/DOSE)  INJECT 1 MG INTO THE SKIN ONCE A WEEK AT 9AM   spironolactone 25 MG tablet Commonly known as: ALDACTONE Take 50 mg by mouth every morning.   tacrolimus ER 1 MG Tb24 Commonly known as: ENVARSUS XR Take 5 mg by mouth daily before breakfast.        Review of Systems  Constitutional:  Negative for appetite change, chills, fatigue, fever and unexpected weight change.  HENT:  Negative for congestion, dental problem, ear discharge, ear pain, facial swelling, hearing loss, nosebleeds, postnasal drip, rhinorrhea, sinus pressure, sinus pain, sneezing, sore throat, tinnitus and trouble swallowing.   Eyes:  Negative for pain, discharge, redness, itching and visual disturbance.  Respiratory:  Negative for cough, chest tightness, shortness of breath and wheezing.   Cardiovascular:  Negative for chest pain, palpitations and leg swelling.  Gastrointestinal:  Negative for abdominal distention, abdominal pain, blood in stool, constipation, diarrhea, nausea and vomiting.  Endocrine: Negative for cold intolerance, heat intolerance, polydipsia, polyphagia and polyuria.  Genitourinary:  Negative for difficulty urinating, dysuria, flank pain, frequency and urgency.  Musculoskeletal:  Negative for arthralgias, back pain, gait problem, joint swelling, myalgias, neck pain and neck stiffness.       Had Fathom pain on right BKA   Skin:  Negative for color change, pallor, rash and  wound.  Neurological:  Negative for dizziness, syncope, speech difficulty, weakness, light-headedness, numbness and headaches.  Hematological:  Does not bruise/bleed easily.  Psychiatric/Behavioral:  Negative for agitation, behavioral problems, confusion, hallucinations, self-injury, sleep disturbance and suicidal ideas. The patient is not nervous/anxious.     Immunization History  Administered Date(s) Administered   Covid-19, Mrna,Vaccine(Spikevax)8yrs and older 04/19/2022   Hepatitis B, ADULT 12/30/2014, 01/27/2015, 03/01/2015, 06/30/2015   Influenza Split 07/31/2011, 01/31/2020   Influenza,inj,Quad PF,6+ Mos 01/19/2020, 12/27/2020, 04/26/2021, 01/16/2022   Influenza-Unspecified 01/19/2020   PFIZER(Purple Top)SARS-COV-2 Vaccination 06/11/2019, 07/07/2019, 12/31/2019, 01/31/2020, 08/31/2020, 10/11/2020   PNEUMOCOCCAL CONJUGATE-20 01/16/2022   Pfizer Covid-19 Vaccine Bivalent Booster 46yrs & up 04/26/2021   Pneumococcal Polysaccharide-23 05/22/2015   Tdap 03/01/2017   Pertinent  Health Maintenance Due  Topic Date Due   FOOT EXAM  Never done   HEMOGLOBIN A1C  04/21/2022   INFLUENZA VACCINE  10/31/2022   OPHTHALMOLOGY EXAM  07/23/2023   Colonoscopy  11/14/2031      03/24/2022    9:00 AM 03/24/2022    9:57 PM 04/26/2022   10:22 AM 07/25/2022   12:57 PM 10/25/2022    8:21 AM  Fall Risk  Falls in the past year?   0 0 0  Was there an injury with Fall?   0 0 0  Fall Risk Category Calculator   0 0 0  (RETIRED) Patient Fall Risk Level High fall risk High fall risk     Patient at Risk for Falls Due to   No Fall Risks No Fall Risks No Fall Risks  Fall risk Follow up   Falls evaluation completed  Falls evaluation completed   Functional Status Survey:    Vitals:   10/25/22 0821  BP: 138/78  Pulse: 78  Resp: 18  Temp: 97.7 F (36.5 C)  SpO2: 98%  Weight: 228 lb (103.4 kg)  Height: 5\' 8"  (1.727 m)   Body mass index is 34.67 kg/m. Physical Exam Vitals reviewed.   Constitutional:      General: He is not in acute distress.    Appearance: Normal appearance. He is normal weight. He is not ill-appearing or diaphoretic.  HENT:     Head:  Normocephalic.     Right Ear: Tympanic membrane, ear canal and external ear normal. There is no impacted cerumen.     Left Ear: Tympanic membrane, ear canal and external ear normal. There is no impacted cerumen.     Nose: Nose normal. No congestion or rhinorrhea.     Mouth/Throat:     Mouth: Mucous membranes are moist.     Pharynx: Oropharynx is clear. No oropharyngeal exudate or posterior oropharyngeal erythema.  Eyes:     General: No scleral icterus.       Right eye: No discharge.        Left eye: No discharge.     Extraocular Movements: Extraocular movements intact.     Conjunctiva/sclera: Conjunctivae normal.     Pupils: Pupils are equal, round, and reactive to light.  Neck:     Vascular: No carotid bruit.  Cardiovascular:     Rate and Rhythm: Normal rate and regular rhythm.     Pulses: Normal pulses.     Heart sounds: Normal heart sounds. No murmur heard.    No friction rub. No gallop.     Comments: Right BKA  Pulmonary:     Effort: Pulmonary effort is normal. No respiratory distress.     Breath sounds: Normal breath sounds. No wheezing, rhonchi or rales.  Chest:     Chest wall: No tenderness.  Abdominal:     General: Bowel sounds are normal. There is no distension.     Palpations: Abdomen is soft. There is no mass.     Tenderness: There is no abdominal tenderness. There is no right CVA tenderness, left CVA tenderness, guarding or rebound.  Musculoskeletal:        General: No swelling or tenderness. Normal range of motion.     Cervical back: Normal range of motion. No rigidity or tenderness.     Right lower leg: No edema.     Left lower leg: No edema.  Lymphadenopathy:     Cervical: No cervical adenopathy.  Skin:    General: Skin is warm and dry.     Coloration: Skin is not pale.     Findings: No  bruising, erythema, lesion or rash.  Neurological:     Mental Status: He is alert and oriented to person, place, and time.     Cranial Nerves: No cranial nerve deficit.     Sensory: No sensory deficit.     Motor: No weakness.     Coordination: Coordination normal.     Comments: RBKA   Psychiatric:        Mood and Affect: Mood normal.        Speech: Speech normal.        Behavior: Behavior normal.        Thought Content: Thought content normal.        Judgment: Judgment normal.     Labs reviewed: Recent Labs    07/15/22 0831 09/11/22 0923 10/10/22 0930  NA 135 139 137  K 4.5 4.3 4.2  CL 104 111 109  CO2 21* 21* 19*  GLUCOSE 149* 157* 137*  BUN 58* 51* 57*  CREATININE 2.51* 2.46* 2.54*  CALCIUM 9.2 8.8* 8.8*  MG 2.0 1.9 1.8  PHOS 4.4 4.6 4.6   Recent Labs    03/22/22 1006 03/23/22 0333  AST 27 26  ALT 29 28  ALKPHOS 67 61  BILITOT 0.9 0.9  PROT 6.6 6.1*  ALBUMIN 3.3* 2.8*   Recent Labs    07/15/22 0831  09/11/22 0923 10/10/22 0930  WBC 9.4 10.8* 10.7*  NEUTROABS 7.6 8.8* 8.7*  HGB 11.1* 10.4* 10.2*  HCT 37.4* 37.4* 34.9*  MCV 83.5 89.5 86.0  PLT 181 178 177   Lab Results  Component Value Date   TSH 1.44 10/19/2021   Lab Results  Component Value Date   HGBA1C 6.3 (H) 10/19/2021   Lab Results  Component Value Date   CHOL 59 10/19/2021   HDL 25 (L) 10/19/2021   LDLCALC 16 10/19/2021   TRIG 99 10/19/2021   CHOLHDL 2.4 10/19/2021    Significant Diagnostic Results in last 30 days:  No results found.  Assessment/Plan  1. Type 2 diabetes mellitus with diabetic neuropathy, with long-term current use of insulin (HCC) Lab Results  Component Value Date   HGBA1C 6.3 (H) 10/19/2021  -Home CGG well-controlled -Continue on Jardiance, semaglutide Humalog and Lantus -Continue with dietary modification -Follow-up with ophthalmology for annual eye exam -Left leg monofilament sensation intact -Continue on atorvastatin and aspirin for cardiovascular  event prevention  2. Hyperlipidemia LDL goal <100 LDL at goal -Continue on atorvastatin -Continue dietary modification and exercise as tolerated  3. Acquired hypothyroidism Lab Results  Component Value Date   TSH 1.44 10/19/2021  -Continue on levothyroxine 75 mcg daily - TSH  4. Leukocytosis, unspecified type Recent WBC was 10.7 previous 10.8  Afebrile  Asymptomatic  Will recheck lab work - CBC with Differential/Platelet  Family/ staff Communication: Reviewed plan of care with patient verbalized understanding  Labs/tests ordered:  - CBC with Differential/Platelet  Next Appointment : Return in about 6 months (around 04/27/2023) for medical mangement of chronic issues.Caesar Bookman, NP

## 2022-10-30 ENCOUNTER — Ambulatory Visit: Payer: Self-pay

## 2022-10-30 MED ORDER — ERGOCALCIFEROL (VITAMIN D2) 1,250 MCG (50,000 UNIT) CAPSULE
ORAL_CAPSULE | ORAL | 0 refills | 84.00000 days | Status: CN
Start: 2022-10-30 — End: 2023-01-28

## 2022-10-31 ENCOUNTER — Telehealth: Payer: Self-pay

## 2022-10-31 DIAGNOSIS — Z940 Kidney transplant status: Principal | ICD-10-CM

## 2022-10-31 MED ORDER — CHOLECALCIFEROL (VITAMIN D3) 50 MCG (2,000 UNIT) CAPSULE
ORAL_CAPSULE | Freq: Every day | ORAL | 11 refills | 30 days | Status: CP
Start: 2022-10-31 — End: 2023-10-31
  Filled 2022-11-04: qty 100, 100d supply, fill #0

## 2022-10-31 NOTE — Telephone Encounter (Signed)
Patient called inquiring about test results from 10/25/22. Please review and send to [facility] clinical pool for all medical assistants to review.  Message sent to Richarda Blade, NP

## 2022-10-31 NOTE — Telephone Encounter (Signed)
See lab results.  

## 2022-11-01 ENCOUNTER — Other Ambulatory Visit: Payer: Self-pay

## 2022-11-01 MED ORDER — GLUCOSE BLOOD VI STRP: ORAL_STRIP | 12 refills | Status: DC

## 2022-11-04 MED FILL — ATORVASTATIN 40 MG TABLET: ORAL | 90 days supply | Qty: 90 | Fill #2

## 2022-11-04 MED FILL — MYCOPHENOLATE SODIUM 180 MG TABLET,DELAYED RELEASE: ORAL | 30 days supply | Qty: 180 | Fill #10

## 2022-11-04 MED FILL — LEVOTHYROXINE 75 MCG TABLET: ORAL | 90 days supply | Qty: 90 | Fill #3

## 2022-11-05 ENCOUNTER — Ambulatory Visit
Admission: EM | Admit: 2022-11-05 | Discharge: 2022-11-05 | Disposition: A | Discharge status: Home / Self Care | Payer: 59 | Attending: Family Medicine | Admitting: Family Medicine

## 2022-11-05 DIAGNOSIS — S81801A Unspecified open wound, right lower leg, initial encounter: Secondary | ICD-10-CM | POA: Diagnosis not present

## 2022-11-05 MED ORDER — MUPIROCIN 2 % EX OINT: 1.0000 | TOPICAL_OINTMENT | Freq: Two times a day (BID) | CUTANEOUS | 0 refills | Status: DC

## 2022-11-05 MED ORDER — CEPHALEXIN 250 MG PO CAPS: 250.0000 mg | ORAL_CAPSULE | Freq: Two times a day (BID) | ORAL | 0 refills | Status: AC

## 2022-11-05 NOTE — Discharge Instructions (Signed)
Put mupirocin ointment on the sore areas twice daily until improved  Take cephalexin 250 mg--1 capsule 2 times daily for 7 days

## 2022-11-05 NOTE — ED Triage Notes (Signed)
Patient here today with c/o wound on the back of his right upper leg X 1 week. Patient noticed it at first looking like a blister but has since popped. Patient has his right leg amputated and has a prosthetic.

## 2022-11-05 NOTE — ED Provider Notes (Signed)
[name]    CSN: [id number] Arrival date & time: 11/05/22  1005      History   Chief Complaint Chief Complaint  Patient presents with   Wound Check    HPI Tim Becker is a 57 y.o. male.    Wound Check  Here for a wound that opened up on his posterior right thigh about 5 to 7 days ago.  Initially it was a little blister when first noted, and then it opened up in the next 24 hours.  Has not been any drainage.  He also has the beginnings of possible pressure injury on his lateral right thigh.  Both these places are where he wears his liner material for his prosthetic leg for the right leg.   No fever He does have a history of diabetes and renal failure.    Past Medical History:  Diagnosis Date   Anemia    Arthritis    Cancer (HCC)    Renal Tumor   CKD (chronic kidney disease)    Coronary artery disease    Diabetes (HCC)    DM (diabetes mellitus) with complications (HCC)    ESRD (end stage renal disease) (HCC)    Pre- dialysis   History of colonoscopy 2011   Hypertension    Kidney transplanted 2022   Left kidney mass    Neuropathy    Obesity    Osteomyelitis, chronic, ankle or foot (HCC)    PONV (postoperative nausea and vomiting)    Renal insufficiency    Patient is on Dialysis and receives M,W and F.   S/P BKA (below knee amputation) (HCC) 08/2014   Right , [place], [place]   Sleep apnea    CPAP   Thyroid disease     Patient Active Problem List   Diagnosis Date Noted   AKI (acute kidney injury) (HCC) 03/23/2022   Community acquired pneumonia 03/22/2022   Hyperlipidemia LDL goal <100 10/24/2021   Severe nonproliferative diabetic retinopathy of right eye (HCC) 09/12/2020   Severe nonproliferative diabetic retinopathy of left eye (HCC) 09/12/2020   Nuclear sclerotic cataract of both eyes 09/12/2020   Charcot's joint, left ankle and foot 10/29/2016   History of right below knee amputation (HCC) 10/29/2016   Diabetic polyneuropathy  associated with type 2 diabetes mellitus (HCC) 10/29/2016   ESRD (end stage renal disease) on dialysis (HCC) 04/15/2016   Chronic osteomyelitis of toe of left foot (HCC) 04/15/2016   Left renal mass 07/11/2015   Biliary calculi 09/14/2014   Hemodialysis-associated hypotension 09/13/2014   H/O amputation of leg through tibia and fibula (HCC) 09/08/2014   Anemia associated with chronic renal failure 09/06/2014   Type 2 diabetes mellitus with diabetic neuropathy (HCC) 08/07/2014   Essential hypertension 08/07/2014   Obesity 08/07/2014   Hypothyroidism 08/07/2014   Kidney lump 04/06/2014   Obstructive apnea 04/06/2014   Diabetes mellitus (HCC) 03/23/2014   Patient awaiting renal transplant 03/23/2014   Asthma, moderate persistent 10/28/2013   Anemia due to blood loss 09/14/2013   Morbid obesity (HCC) 09/11/2013   Morbid (severe) obesity due to excess calories (HCC) 09/11/2013   Chronic kidney disease, stage IV (severe) (HCC) 09/10/2013   Type 2 diabetes mellitus (HCC) 09/10/2013   Mild persistent asthma with acute exacerbation 09/10/2013   Abnormal ECG 05/07/2013   CAD in native artery 05/07/2013    Past Surgical History:  Procedure Laterality Date   BASCILIC VEIN TRANSPOSITION Left 02/17/2015   Procedure: BASCILIC VEIN TRANSPOSITION;  Surgeon: Latina Craver  Schnier, MD;  Location: [facility];  Service: Vascular;  Laterality: Left;   BELOW KNEE LEG AMPUTATION Right    CORONARY ANGIOPLASTY WITH STENT PLACEMENT     EYE SURGERY     FOOT SURGERY     Multiple R foot surgery for infection and charcot foot   I & D EXTREMITY Left 04/16/2016   Procedure: IRRIGATION AND DEBRIDEMENT EXTREMITY/GREAT TOE AMP.;  Surgeon: Felecia Shelling, DPM;  Location: [facility];  Service: Podiatry;  Laterality: Left;   JOINT REPLACEMENT     LAPAROSCOPIC NEPHRECTOMY, HAND ASSISTED Left 07/11/2015   Procedure: HAND ASSISTED LAPAROSCOPIC NEPHRECTOMY;  Surgeon: Vanna Scotland, MD;  Location: [facility];  Service: Urology;   Laterality: Left;   PERIPHERAL VASCULAR CATHETERIZATION Left 12/06/2014   Procedure: A/V Shuntogram/Fistulagram;  Surgeon: Renford Dills, MD;  Location: [facility];  Service: Cardiovascular;  Laterality: Left;   PERIPHERAL VASCULAR CATHETERIZATION Left 12/06/2014   Procedure: A/V Shunt Intervention;  Surgeon: Renford Dills, MD;  Location: [facility];  Service: Cardiovascular;  Laterality: Left;   TONSILLECTOMY         Home Medications    Prior to Admission medications   Medication Sig Start Date End Date Taking? Authorizing Provider  acetaminophen (TYLENOL) 500 MG tablet Take 500 mg by mouth every 6 (six) hours as needed for moderate pain or mild pain.   Yes [provider]  allopurinol (ZYLOPRIM) 100 MG tablet Take 100 mg by mouth every other day.   Yes [provider]  amLODipine (NORVASC) 10 MG tablet Take 10 mg by mouth in the morning. 9am 01/12/18  Yes [provider]  aspirin EC 81 MG tablet Take 81 mg by mouth daily. Swallow whole.   Yes [provider]  atorvastatin (LIPITOR) 40 MG tablet Take 40 mg by mouth daily. 9pm.   Yes [provider]  carvedilol (COREG) 12.5 MG tablet Take 37.5 mg by mouth in the morning, at noon, and at bedtime. 9am , and 9pm   Yes [provider]  cephALEXin (KEFLEX) 250 MG capsule Take 1 capsule (250 mg total) by mouth 2 (two) times daily for 7 days. 11/05/22 11/12/22 Yes , Janace Aris, MD  chlorthalidone (HYGROTON) 25 MG tablet Take 25 mg by mouth daily.   Yes [provider]  empagliflozin (JARDIANCE) 10 MG TABS tablet Take 10 mg by mouth daily.   Yes [provider]  furosemide (LASIX) 80 MG tablet Take 80 mg by mouth as needed for edema or fluid.   Yes [provider]  hydrALAZINE (APRESOLINE) 25 MG tablet Take 100 mg by mouth in the morning and at bedtime.   Yes [provider]  insulin glargine (LANTUS) 100 UNIT/ML Solostar Pen Inject  35 Units into the skin daily. 35 units daily before breakfast 06/17/22  Yes Ngetich, Dinah C, NP  insulin lispro (HUMALOG) 100 UNIT/ML KwikPen .03-0.17 mLs (3-17 Units total) into the skin 2 (two) times daily as needed for high blood sugar E11.40 05/08/22  Yes Ngetich, Dinah C, NP  levothyroxine (SYNTHROID) 75 MCG tablet Take 75 mcg by mouth daily before breakfast. 7am   Yes [provider]  mupirocin ointment (BACTROBAN) 2 % Apply 1 Application topically 2 (two) times daily. To affected area till better 11/05/22  Yes , Janace Aris, MD  mycophenolate (MYFORTIC) 180 MG EC tablet Take 3 tablets (540 mg total) by mouth in the morning and at bedtime. 3 tablets (540 mg total per dose) at  9am and 9pm 03/25/22  Yes Gomez-Caraballo, Byrd Hesselbach, MD  Semaglutide, 1 MG/DOSE, (OZEMPIC, 1 MG/DOSE,) 4 MG/3ML SOPN INJECT 1 MG INTO THE SKIN ONCE A WEEK AT 9AM 09/26/22  Yes Ngetich, Dinah C, NP  spironolactone (ALDACTONE) 25 MG tablet Take 50 mg by mouth every morning.   Yes [provider]  tacrolimus ER (ENVARSUS XR) 1 MG TB24 Take 5 mg by mouth daily before breakfast.   Yes [provider]  Accu-Chek Softclix Lancets lancets Test blood sugar 3 times a day. DX: E11.40 05/23/22   Ngetich, Dinah C, NP  Blood Glucose Monitoring Suppl (ACCU-CHEK AVIVA PLUS) w/Device KIT Use to test blood sugar three times daily. Dx: E11.40 05/23/22   Ngetich, Dinah C, NP  glucose blood test strip Use to test bood sugar three times daily  DX: E11.40 11/01/22   Ngetich, Dinah C, NP  Insulin Pen Needle (BD PEN NEEDLE NANO 2ND GEN) 32G X 4 MM MISC Use to test blood sugar four times daily. Dx:E11.40 07/26/22   Ngetich, Donalee Citrin, NP    Family History Family History  Problem Relation Age of Onset   Diabetes Mother    Kidney disease Mother    Breast cancer Mother     Social History Social History   Tobacco Use   Smoking status: Never   Smokeless tobacco: Never  Vaping Use   Vaping status: Never Used  Substance Use  Topics   Alcohol use: No   Drug use: Never     Allergies   Patient has no active allergies.   Review of Systems Review of Systems   Physical Exam Triage Vital Signs ED Triage Vitals  Encounter Vitals Group     BP 11/05/22 1048 116/63     Systolic BP Percentile --      Diastolic BP Percentile --      Pulse Rate 11/05/22 1048 77     Resp 11/05/22 1048 16     Temp 11/05/22 1048 98.3 F (36.8 C)     Temp Source 11/05/22 1048 Oral     SpO2 11/05/22 1048 95 %     Weight 11/05/22 1047 223 lb (101.2 kg)     Height 11/05/22 1047 5\' 8"  (1.727 m)     Head Circumference --      Peak Flow --      Pain Score 11/05/22 1047 3     Pain Loc --      Pain Education --      Exclude from Growth Chart --    No data found.  Updated Vital Signs BP 116/63 (BP Location: Right Arm)   Pulse 77   Temp 98.3 F (36.8 C) (Oral)   Resp 16   Ht 5\' 8"  (1.727 m)   Wt 101.2 kg   SpO2 95%   BMI 33.91 kg/m   Visual Acuity Right Eye Distance:   Left Eye Distance:   Bilateral Distance:    Right Eye Near:   Left Eye Near:    Bilateral Near:     Physical Exam Vitals reviewed.  Constitutional:      General: He is not in acute distress.    Appearance: He is not ill-appearing, toxic-appearing or diaphoretic.  Skin:    Coloration: Skin is not pale.     Comments: On his right posterior thigh about midway between hip and knee, there is an open wound that is fairly shallow and about 2 cm in diameter.  The bed of the wound is healthy  and pink.  There is no raised edge and there is no induration.  No drainage  Also linear area that is about 8 cm in length and about half a centimeter in width that is horizontal across his medial right thigh.  There is no open wound at this time there.    Neurological:     Mental Status: He is alert and oriented to person, place, and time.  Psychiatric:        Behavior: Behavior normal.      UC Treatments / Results  Labs (all labs ordered are listed, but  only abnormal results are displayed) Labs Reviewed - No data to display  EKG   Radiology No results found.  Procedures Procedures (including critical care time)  Medications Ordered in UC Medications - No data to display  Initial Impression / Assessment and Plan / UC Course  I have reviewed the triage vital signs and the nursing notes.  Pertinent labs & imaging results that were available during my care of the patient were reviewed by me and considered in my medical decision making (see chart for details).        Mupirocin is sent in for topical treatment and Keflex is sent in for 1 week, for infection prevention.  He is going to contact his prosthetic provider about the pressure from the liners, and he is going to follow-up with his primary care. Final Clinical Impressions(s) / UC Diagnoses   Final diagnoses:  Wound of right lower extremity, initial encounter     Discharge Instructions      Put mupirocin ointment on the sore areas twice daily until improved  Take cephalexin 250 mg--1 capsule 2 times daily for 7 days      ED Prescriptions     Medication Sig Dispense Auth. Provider   mupirocin ointment (BACTROBAN) 2 % Apply 1 Application topically 2 (two) times daily. To affected area till better 22 g Zenia Resides, MD   cephALEXin (KEFLEX) 250 MG capsule Take 1 capsule (250 mg total) by mouth 2 (two) times daily for 7 days. 14 capsule , Janace Aris, MD      I have reviewed the PDMP during this encounter.   Zenia Resides, MD 11/05/22 [phone]

## 2022-11-08 MED FILL — GABAPENTIN 100 MG CAPSULE: ORAL | 30 days supply | Qty: 60 | Fill #9

## 2022-11-11 ENCOUNTER — Other Ambulatory Visit (HOSPITAL_COMMUNITY)
Admission: RE | Admit: 2022-11-11 | Discharge: 2022-11-11 | Disposition: A | Discharge status: Home / Self Care | Payer: 59 | Source: Ambulatory Visit | Attending: Pediatric Nephrology | Admitting: Pediatric Nephrology

## 2022-11-11 DIAGNOSIS — Z940 Kidney transplant status: Secondary | ICD-10-CM | POA: Insufficient documentation

## 2022-11-11 LAB — BASIC METABOLIC PANEL
Anion gap: 8 (ref 5–15)
BUN: 52 mg/dL — ABNORMAL HIGH (ref 6–20)
CO2: 20 mmol/L — ABNORMAL LOW (ref 22–32)
Calcium: 8.8 mg/dL — ABNORMAL LOW (ref 8.9–10.3)
Chloride: 108 mmol/L (ref 98–111)
Creatinine, Ser: 2.63 mg/dL — ABNORMAL HIGH (ref 0.61–1.24)
GFR, Estimated: 28 mL/min — ABNORMAL LOW (ref >60)
Glucose, Bld: 137 mg/dL — ABNORMAL HIGH (ref 70–99)
Potassium: 5.2 mmol/L — ABNORMAL HIGH (ref 3.5–5.1)
Sodium: 136 mmol/L (ref 135–145)

## 2022-11-11 LAB — CBC WITH DIFFERENTIAL/PLATELET
Abs Immature Granulocytes: 0.05 10*3/uL (ref 0.00–0.07)
Basophils Absolute: 0 10*3/uL (ref 0.0–0.1)
Basophils Relative: 0 %
Eosinophils Absolute: 0.1 10*3/uL (ref 0.0–0.5)
Eosinophils Relative: 1 %
HCT: 35.7 % — ABNORMAL LOW (ref 39.0–52.0)
Hemoglobin: 10.4 g/dL — ABNORMAL LOW (ref 13.0–17.0)
Immature Granulocytes: 1 %
Lymphocytes Relative: 7 %
Lymphs Abs: 0.7 10*3/uL (ref 0.7–4.0)
MCH: 24.9 pg — ABNORMAL LOW (ref 26.0–34.0)
MCHC: 29.1 g/dL — ABNORMAL LOW (ref 30.0–36.0)
MCV: 85.4 fL (ref 80.0–100.0)
Monocytes Absolute: 0.7 10*3/uL (ref 0.1–1.0)
Monocytes Relative: 7 %
Neutro Abs: 8.5 10*3/uL — ABNORMAL HIGH (ref 1.7–7.7)
Neutrophils Relative %: 84 %
Platelets: 203 10*3/uL (ref 150–400)
RBC: 4.18 MIL/uL — ABNORMAL LOW (ref 4.22–5.81)
RDW: 13.9 % (ref 11.5–15.5)
WBC: 10 10*3/uL (ref 4.0–10.5)
nRBC: 0 % (ref 0.0–0.2)

## 2022-11-11 LAB — URINALYSIS, W/ REFLEX TO CULTURE (INFECTION SUSPECTED)
Bilirubin Urine: NEGATIVE
Glucose, UA: >=500 mg/dL — AB
Hgb urine dipstick: NEGATIVE
Ketones, ur: NEGATIVE mg/dL
Leukocytes,Ua: NEGATIVE
Nitrite: NEGATIVE
Protein, ur: NEGATIVE mg/dL
Specific Gravity, Urine: 1.011 (ref 1.005–1.030)
pH: 5 (ref 5.0–8.0)

## 2022-11-11 LAB — MAGNESIUM: Magnesium: 1.9 mg/dL (ref 1.7–2.4)

## 2022-11-11 LAB — PHOSPHORUS: Phosphorus: 3.8 mg/dL (ref 2.5–4.6)

## 2022-11-13 LAB — TACROLIMUS LEVEL: Tacrolimus (FK506) - LabCorp: 8.3 ng/mL (ref 2.0–20.0)

## 2022-11-14 MED FILL — HYDRALAZINE 100 MG TABLET: ORAL | 90 days supply | Qty: 180 | Fill #1

## 2022-11-18 MED FILL — ALLOPURINOL 100 MG TABLET: ORAL | 60 days supply | Qty: 30 | Fill #4

## 2022-11-25 ENCOUNTER — Other Ambulatory Visit: Payer: Self-pay | Admitting: Family

## 2022-11-27 ENCOUNTER — Other Ambulatory Visit: Payer: Self-pay | Admitting: Family

## 2022-11-27 DIAGNOSIS — Z794 Long term (current) use of insulin: Secondary | ICD-10-CM

## 2022-11-27 DIAGNOSIS — E039 Hypothyroidism, unspecified: Secondary | ICD-10-CM

## 2022-11-27 DIAGNOSIS — E785 Hyperlipidemia, unspecified: Secondary | ICD-10-CM

## 2022-11-27 DIAGNOSIS — N189 Chronic kidney disease, unspecified: Secondary | ICD-10-CM

## 2022-11-28 ENCOUNTER — Other Ambulatory Visit: Payer: Self-pay

## 2022-11-28 DIAGNOSIS — D631 Anemia in chronic kidney disease: Secondary | ICD-10-CM

## 2022-11-28 DIAGNOSIS — E1140 Type 2 diabetes mellitus with diabetic neuropathy, unspecified: Secondary | ICD-10-CM

## 2022-11-28 DIAGNOSIS — E785 Hyperlipidemia, unspecified: Secondary | ICD-10-CM

## 2022-11-28 DIAGNOSIS — E039 Hypothyroidism, unspecified: Secondary | ICD-10-CM

## 2022-11-29 ENCOUNTER — Other Ambulatory Visit: Payer: Self-pay | Admitting: Family

## 2022-11-29 DIAGNOSIS — E1140 Type 2 diabetes mellitus with diabetic neuropathy, unspecified: Secondary | ICD-10-CM

## 2022-11-29 MED ORDER — CHLORTHALIDONE 25 MG TABLET
ORAL_TABLET | Freq: Every morning | ORAL | 11 refills | 30 days
Start: 2022-11-29 — End: 2023-11-29

## 2022-12-03 ENCOUNTER — Other Ambulatory Visit: Payer: 59

## 2022-12-03 DIAGNOSIS — E785 Hyperlipidemia, unspecified: Secondary | ICD-10-CM | POA: Diagnosis not present

## 2022-12-03 DIAGNOSIS — N189 Chronic kidney disease, unspecified: Secondary | ICD-10-CM | POA: Diagnosis not present

## 2022-12-03 DIAGNOSIS — Z794 Long term (current) use of insulin: Secondary | ICD-10-CM | POA: Diagnosis not present

## 2022-12-03 DIAGNOSIS — E1140 Type 2 diabetes mellitus with diabetic neuropathy, unspecified: Secondary | ICD-10-CM | POA: Diagnosis not present

## 2022-12-03 DIAGNOSIS — E039 Hypothyroidism, unspecified: Secondary | ICD-10-CM | POA: Diagnosis not present

## 2022-12-04 LAB — TSH: TSH: 2.56 m[IU]/L (ref 0.40–4.50)

## 2022-12-04 LAB — COMPLETE METABOLIC PANEL WITH GFR
AG Ratio: 2.2 (calc) (ref 1.0–2.5)
ALT: 8 U/L — ABNORMAL LOW (ref 9–46)
AST: 9 U/L — ABNORMAL LOW (ref 10–35)
Albumin: 4.3 g/dL (ref 3.6–5.1)
Alkaline phosphatase (APISO): 90 U/L (ref 35–144)
BUN/Creatinine Ratio: 18 (calc) (ref 6–22)
BUN: 51 mg/dL — ABNORMAL HIGH (ref 7–25)
CO2: 20 mmol/L (ref 20–32)
Calcium: 9.4 mg/dL (ref 8.6–10.3)
Chloride: 112 mmol/L — ABNORMAL HIGH (ref 98–110)
Creat: 2.85 mg/dL — ABNORMAL HIGH (ref 0.70–1.30)
Globulin: 2 g/dL (ref 1.9–3.7)
Glucose, Bld: 130 mg/dL — ABNORMAL HIGH (ref 65–99)
Potassium: 5.4 mmol/L — ABNORMAL HIGH (ref 3.5–5.3)
Sodium: 139 mmol/L (ref 135–146)
Total Bilirubin: 0.6 mg/dL (ref 0.2–1.2)
Total Protein: 6.3 g/dL (ref 6.1–8.1)
eGFR: 25 mL/min/{1.73_m2} — ABNORMAL LOW (ref >=60)

## 2022-12-04 LAB — CBC WITH DIFFERENTIAL/PLATELET
Absolute Monocytes: 641 {cells}/uL (ref 200–950)
Basophils Absolute: 24 {cells}/uL (ref 0–200)
Basophils Relative: 0.2 %
Eosinophils Absolute: 97 {cells}/uL (ref 15–500)
Eosinophils Relative: 0.8 %
HCT: 34.4 % — ABNORMAL LOW (ref 38.5–50.0)
Hemoglobin: 10.3 g/dL — ABNORMAL LOW (ref 13.2–17.1)
Lymphs Abs: 956 {cells}/uL (ref 850–3900)
MCH: 24.7 pg — ABNORMAL LOW (ref 27.0–33.0)
MCHC: 29.9 g/dL — ABNORMAL LOW (ref 32.0–36.0)
MCV: 82.5 fL (ref 80.0–100.0)
MPV: 10.8 fL (ref 7.5–12.5)
Monocytes Relative: 5.3 %
Neutro Abs: 10382 {cells}/uL — ABNORMAL HIGH (ref 1500–7800)
Neutrophils Relative %: 85.8 %
Platelets: 205 10*3/uL (ref 140–400)
RBC: 4.17 10*6/uL — ABNORMAL LOW (ref 4.20–5.80)
RDW: 13.1 % (ref 11.0–15.0)
Total Lymphocyte: 7.9 %
WBC: 12.1 10*3/uL — ABNORMAL HIGH (ref 3.8–10.8)

## 2022-12-04 LAB — LIPID PANEL
Cholesterol: 62 mg/dL (ref <200)
HDL: 25 mg/dL — ABNORMAL LOW (ref >=40)
LDL Cholesterol (Calc): 20 mg/dL
Non-HDL Cholesterol (Calc): 37 mg/dL (ref <130)
Total CHOL/HDL Ratio: 2.5 (calc) (ref <5.0)
Triglycerides: 90 mg/dL (ref <150)

## 2022-12-04 LAB — HEMOGLOBIN A1C
Hgb A1c MFr Bld: 6.7 %{Hb} — ABNORMAL HIGH (ref <5.7)
Mean Plasma Glucose: 146 mg/dL
eAG (mmol/L): 8.1 mmol/L

## 2022-12-04 MED ORDER — CHLORTHALIDONE 25 MG TABLET
ORAL_TABLET | Freq: Every morning | ORAL | 11 refills | 30 days | Status: CP
Start: 2022-12-04 — End: 2023-12-04
  Filled 2022-12-05: qty 60, 30d supply, fill #0

## 2022-12-05 MED FILL — GABAPENTIN 100 MG CAPSULE: ORAL | 30 days supply | Qty: 60 | Fill #10

## 2022-12-05 MED FILL — JARDIANCE 25 MG TABLET: ORAL | 90 days supply | Qty: 90 | Fill #3

## 2022-12-05 MED FILL — AMLODIPINE 10 MG TABLET: ORAL | 90 days supply | Qty: 90 | Fill #3

## 2022-12-05 MED FILL — MYCOPHENOLATE SODIUM 180 MG TABLET,DELAYED RELEASE: ORAL | 30 days supply | Qty: 180 | Fill #11

## 2022-12-23 DIAGNOSIS — Z940 Kidney transplant status: Principal | ICD-10-CM

## 2022-12-23 DIAGNOSIS — Z79899 Other long term (current) drug therapy: Principal | ICD-10-CM

## 2022-12-23 DIAGNOSIS — N183 Stage 3 chronic kidney disease, unspecified whether stage 3a or 3b CKD (CMS-HCC): Principal | ICD-10-CM

## 2022-12-23 DIAGNOSIS — N189 Chronic kidney disease, unspecified: Principal | ICD-10-CM

## 2022-12-23 DIAGNOSIS — R7989 Other specified abnormal findings of blood chemistry: Principal | ICD-10-CM

## 2022-12-24 DIAGNOSIS — G4733 Obstructive sleep apnea (adult) (pediatric): Secondary | ICD-10-CM | POA: Diagnosis not present

## 2022-12-25 ENCOUNTER — Ambulatory Visit: Admit: 2022-12-25 | Discharge: 2022-12-25 | Payer: MEDICARE

## 2022-12-25 DIAGNOSIS — Z23 Encounter for immunization: Principal | ICD-10-CM

## 2022-12-25 DIAGNOSIS — R7989 Other specified abnormal findings of blood chemistry: Principal | ICD-10-CM

## 2022-12-25 DIAGNOSIS — Z79899 Other long term (current) drug therapy: Principal | ICD-10-CM

## 2022-12-25 DIAGNOSIS — N183 Stage 3 chronic kidney disease, unspecified whether stage 3a or 3b CKD (CMS-HCC): Principal | ICD-10-CM

## 2022-12-25 DIAGNOSIS — N189 Chronic kidney disease, unspecified: Principal | ICD-10-CM

## 2022-12-25 DIAGNOSIS — Z940 Kidney transplant status: Principal | ICD-10-CM

## 2022-12-25 DIAGNOSIS — N271 Small kidney, bilateral: Secondary | ICD-10-CM | POA: Diagnosis not present

## 2022-12-26 ENCOUNTER — Other Ambulatory Visit: Payer: Self-pay | Admitting: Family

## 2022-12-26 DIAGNOSIS — E1140 Type 2 diabetes mellitus with diabetic neuropathy, unspecified: Secondary | ICD-10-CM

## 2022-12-26 DIAGNOSIS — Z794 Long term (current) use of insulin: Secondary | ICD-10-CM

## 2022-12-26 MED ORDER — GLUCOSE BLOOD VI STRP: 1.0000 | ORAL_STRIP | Freq: Three times a day (TID) | 12 refills | Status: DC

## 2022-12-27 ENCOUNTER — Other Ambulatory Visit: Payer: Self-pay | Admitting: Family

## 2022-12-31 DIAGNOSIS — G4733 Obstructive sleep apnea (adult) (pediatric): Secondary | ICD-10-CM | POA: Diagnosis not present

## 2023-01-03 DIAGNOSIS — Z940 Kidney transplant status: Principal | ICD-10-CM

## 2023-01-03 MED ORDER — ALLOPURINOL 100 MG TABLET
ORAL_TABLET | ORAL | 3 refills | 90 days | Status: CP
Start: 2023-01-03 — End: 2024-01-03
  Filled 2023-01-08: qty 45, 90d supply, fill #0

## 2023-01-03 MED ORDER — POLYETHYLENE GLYCOL 3350 17 GRAM/DOSE ORAL POWDER
Freq: Every day | ORAL | 0 refills | 30 days | Status: CP | PRN
Start: 2023-01-03 — End: 2023-01-03

## 2023-01-03 MED ORDER — LEVOTHYROXINE 75 MCG TABLET
ORAL_TABLET | Freq: Every day | ORAL | 3 refills | 90 days | Status: CP
Start: 2023-01-03 — End: 2024-01-03
  Filled 2023-02-06: qty 90, 90d supply, fill #0

## 2023-01-03 MED ORDER — MYCOPHENOLATE SODIUM 180 MG TABLET,DELAYED RELEASE
ORAL_TABLET | Freq: Two times a day (BID) | ORAL | 11 refills | 30 days | Status: CP
Start: 2023-01-03 — End: 2024-01-03
  Filled 2023-01-08: qty 180, 30d supply, fill #0

## 2023-01-08 MED FILL — SPIRONOLACTONE 25 MG TABLET: ORAL | 90 days supply | Qty: 180 | Fill #3

## 2023-01-08 MED FILL — CHLORTHALIDONE 25 MG TABLET: ORAL | 30 days supply | Qty: 60 | Fill #1

## 2023-01-08 MED FILL — GABAPENTIN 100 MG CAPSULE: ORAL | 30 days supply | Qty: 60 | Fill #11

## 2023-01-08 MED FILL — CARVEDILOL 12.5 MG TABLET: ORAL | 90 days supply | Qty: 540 | Fill #3

## 2023-01-09 ENCOUNTER — Ambulatory Visit: Payer: 59 | Admitting: Family

## 2023-01-09 ENCOUNTER — Encounter: Payer: Self-pay | Admitting: Family

## 2023-01-09 VITALS — BP 130/80 | HR 62 | Temp 97.3°F | Resp 16 | Ht 68.0 in | Wt 226.2 lb

## 2023-01-09 DIAGNOSIS — Z940 Kidney transplant status: Principal | ICD-10-CM

## 2023-01-09 DIAGNOSIS — J029 Acute pharyngitis, unspecified: Secondary | ICD-10-CM | POA: Diagnosis not present

## 2023-01-09 LAB — POCT RAPID STREP A (OFFICE): Rapid Strep A Screen: NEGATIVE

## 2023-01-09 MED ORDER — DOXYCYCLINE HYCLATE 100 MG PO TABS
100.0000 mg | ORAL_TABLET | Freq: Two times a day (BID) | ORAL | 0 refills | Status: AC
Start: 2023-01-09 — End: 2023-01-16

## 2023-01-09 NOTE — Progress Notes (Signed)
Provider: Richarda Blade FNP-C  Maddox Hlavaty, Donalee Citrin, NP  Patient Care Team: Harneet Noblett, Donalee Citrin, NP as PCP - General (Family Medicine) Jodelle Red, MD as PCP - Cardiology (Cardiology) Nadara Mustard, MD as Consulting Physician (Orthopedic Surgery) Felecia Shelling, DPM as Consulting Physician (Podiatry) [facility]  Extended Emergency Contact Information Primary Emergency Contact: [name],[name] Address: [address] [place], [place] [address] [place] Home Phone: [phone] Mobile Phone: [phone] Relation: Spouse  Code Status:  Full Code  Goals of care: Advanced Directive information    01/09/2023    9:16 AM  Advanced Directives  Does Patient Have a Medical Advance Directive? Yes  Type of Advance Directive Living will  Does patient want to make changes to medical advance directive? Yes (Inpatient - patient defers changing a medical advance directive at this time - Information given)     Chief Complaint  Patient presents with   Acute Visit    Sore throat mostly on left side. Does not hurt to swallow.    HPI:  Pt is a 58 y.o. male seen today for an acute visit for evaluation of left side sore throat x 1 week.Has been taking Alkerzers for three days. Makes it better at night but comes back in the day. Has been tired but could be sleeping partner.No exposure to sick person. Wife keeps window open through out.    Past Medical History:  Diagnosis Date   Anemia    Arthritis    Cancer (HCC)    Renal Tumor   CKD (chronic kidney disease)    Coronary artery disease    Diabetes (HCC)    DM (diabetes mellitus) with complications (HCC)    ESRD (end stage renal disease) (HCC)    Pre- dialysis   History of colonoscopy 2011   Hypertension    Kidney transplanted 2022   Left kidney mass    Neuropathy    Obesity    Osteomyelitis, chronic, ankle or foot (HCC)    PONV (postoperative nausea and vomiting)    Renal  insufficiency    Patient is on Dialysis and receives M,W and F.   S/P BKA (below knee amputation) (HCC) 08/2014   Right , [facility] [place], [place]   Sleep apnea    CPAP   Thyroid disease    Past Surgical History:  Procedure Laterality Date   BASCILIC VEIN TRANSPOSITION Left 02/17/2015   Procedure: BASCILIC VEIN TRANSPOSITION;  Surgeon: Renford Dills, MD;  Location: [facility];  Service: Vascular;  Laterality: Left;   BELOW KNEE LEG AMPUTATION Right    CORONARY ANGIOPLASTY WITH STENT PLACEMENT     EYE SURGERY     FOOT SURGERY     Multiple R foot surgery for infection and charcot foot   I & D EXTREMITY Left 04/16/2016   Procedure: IRRIGATION AND DEBRIDEMENT EXTREMITY/GREAT TOE AMP.;  Surgeon: Felecia Shelling, DPM;  Location: [facility];  Service: Podiatry;  Laterality: Left;   JOINT REPLACEMENT     LAPAROSCOPIC NEPHRECTOMY, HAND ASSISTED Left 07/11/2015   Procedure: HAND ASSISTED LAPAROSCOPIC NEPHRECTOMY;  Surgeon: Vanna Scotland, MD;  Location: [facility];  Service: Urology;  Laterality: Left;   PERIPHERAL VASCULAR CATHETERIZATION Left 12/06/2014   Procedure: A/V Shuntogram/Fistulagram;  Surgeon: Renford Dills, MD;  Location: [facility];  Service: Cardiovascular;  Laterality: Left;   PERIPHERAL VASCULAR CATHETERIZATION Left 12/06/2014   Procedure: A/V Shunt Intervention;  Surgeon:  Renford Dills, MD;  Location: [facility];  Service: Cardiovascular;  Laterality: Left;   TONSILLECTOMY      No Known Allergies  Outpatient Encounter Medications as of 01/09/2023  Medication Sig   Accu-Chek Softclix Lancets lancets Test blood sugar 3 times a day. DX: E11.40   acetaminophen (TYLENOL) 500 MG tablet Take 500 mg by mouth every 6 (six) hours as needed for moderate pain or mild pain.   allopurinol (ZYLOPRIM) 100 MG tablet Take 100 mg by mouth every other day.   amLODipine (NORVASC) 10 MG tablet Take 10 mg by mouth in the morning. 9am   aspirin EC 81 MG tablet Take 81 mg by mouth daily.  Swallow whole.   atorvastatin (LIPITOR) 40 MG tablet Take 40 mg by mouth daily. 9pm.   Blood Glucose Monitoring Suppl (ACCU-CHEK AVIVA PLUS) w/Device KIT Use to test blood sugar three times daily. Dx: E11.40   carvedilol (COREG) 12.5 MG tablet Take 37.5 mg by mouth in the morning, at noon, and at bedtime. 9am , and 9pm   chlorthalidone (HYGROTON) 25 MG tablet Take 25 mg by mouth daily.   empagliflozin (JARDIANCE) 10 MG TABS tablet Take 10 mg by mouth daily.   furosemide (LASIX) 80 MG tablet Take 80 mg by mouth as needed for edema or fluid.   glucose blood test strip 1 each by Other route 3 (three) times daily. Use as instructed   hydrALAZINE (APRESOLINE) 25 MG tablet Take 100 mg by mouth in the morning and at bedtime.   insulin glargine (LANTUS SOLOSTAR) 100 UNIT/ML Solostar Pen INJECT 35 UNITS SUBCUTANEOUSLY ONCE DAILY BEFORE BREAKFAST   insulin lispro (HUMALOG) 100 UNIT/ML KwikPen .03-0.17 mLs (3-17 Units total) into the skin 2 (two) times daily as needed for high blood sugar E11.40   Insulin Pen Needle (BD PEN NEEDLE NANO 2ND GEN) 32G X 4 MM MISC Use to test blood sugar four times daily. Dx:E11.40   levothyroxine (SYNTHROID) 75 MCG tablet Take 75 mcg by mouth daily before breakfast. 7am   mupirocin ointment (BACTROBAN) 2 % Apply 1 Application topically 2 (two) times daily. To affected area till better   mycophenolate (MYFORTIC) 180 MG EC tablet Take 3 tablets (540 mg total) by mouth in the morning and at bedtime. 3 tablets (540 mg total per dose) at 9am and 9pm   Semaglutide, 1 MG/DOSE, (OZEMPIC, 1 MG/DOSE,) 4 MG/3ML SOPN INJECT 1 MG INTO THE SKIN ONCE A WEEK AT 9AM   spironolactone (ALDACTONE) 25 MG tablet Take 50 mg by mouth every morning.   tacrolimus ER (ENVARSUS XR) 1 MG TB24 Take 5 mg by mouth daily before breakfast.   No facility-administered encounter medications on file as of 01/09/2023.    Review of Systems  Constitutional:  Negative for appetite change, chills, fatigue, fever and  unexpected weight change.  HENT:  Positive for sore throat. Negative for congestion, ear discharge, ear pain, facial swelling, hearing loss, nosebleeds, postnasal drip, rhinorrhea, sinus pressure, sinus pain, sneezing, tinnitus and trouble swallowing.   Eyes:  Negative for pain, discharge, redness, itching and visual disturbance.  Respiratory:  Negative for cough, chest tightness, shortness of breath and wheezing.   Cardiovascular:  Negative for chest pain, palpitations and leg swelling.  Gastrointestinal:  Negative for abdominal distention, abdominal pain, blood in stool, constipation, diarrhea, nausea and vomiting.  Genitourinary:  Negative for urgency.  Musculoskeletal:  Negative for arthralgias, back pain, gait problem, joint swelling, myalgias, neck pain and neck stiffness.  Skin:  Negative for color change, pallor, rash and wound.  Neurological:  Negative for dizziness, syncope, speech difficulty, weakness, light-headedness, numbness and headaches.  Hematological:  Does not bruise/bleed easily.  Psychiatric/Behavioral:  Negative for agitation, behavioral problems, confusion, hallucinations, self-injury, sleep disturbance and suicidal ideas. The patient is not nervous/anxious.     Immunization History  Administered Date(s) Administered   Hepatitis B, ADULT 12/30/2014, 01/27/2015, 03/01/2015, 06/30/2015   Influenza Split 07/31/2011, 01/31/2020   Influenza,inj,Quad PF,6+ Mos 01/19/2020, 12/27/2020, 04/26/2021, 01/16/2022   Influenza-Unspecified 01/19/2020   Moderna Covid-19 Fall Seasonal Vaccine 48yrs & older 04/19/2022   PFIZER(Purple Top)SARS-COV-2 Vaccination 06/11/2019, 07/07/2019, 12/31/2019, 01/31/2020, 08/31/2020, 10/11/2020   PNEUMOCOCCAL CONJUGATE-20 01/16/2022   Pfizer Covid-19 Vaccine Bivalent Booster 63yrs & up 04/26/2021   Pneumococcal Polysaccharide-23 05/22/2015   Tdap 03/01/2017   Pertinent  Health Maintenance Due  Topic Date Due   FOOT EXAM  Never done   HEMOGLOBIN  A1C  06/02/2023   OPHTHALMOLOGY EXAM  07/23/2023   Colonoscopy  11/14/2031   INFLUENZA VACCINE  Completed      03/24/2022    9:57 PM 04/26/2022   10:22 AM 07/25/2022   12:57 PM 10/25/2022    8:21 AM 01/09/2023    9:16 AM  Fall Risk  Falls in the past year?  0 0 0 0  Was there an injury with Fall?  0 0 0   Fall Risk Category Calculator  0 0 0   (RETIRED) Patient Fall Risk Level High fall risk      Patient at Risk for Falls Due to  No Fall Risks No Fall Risks No Fall Risks   Fall risk Follow up  Falls evaluation completed  Falls evaluation completed    Functional Status Survey:    Vitals:   01/09/23 0917  BP: 130/80  Pulse: 62  Resp: 16  Temp: (!) 97.3 F (36.3 C)  SpO2: 99%  Weight: 226 lb 3.2 oz (102.6 kg)  Height: 5\' 8"  (1.727 m)   Body mass index is 34.39 kg/m. Physical Exam Vitals reviewed.  Constitutional:      General: He is not in acute distress.    Appearance: Normal appearance. He is normal weight. He is not ill-appearing or diaphoretic.  HENT:     Head: Normocephalic.     Right Ear: Tympanic membrane, ear canal and external ear normal. There is no impacted cerumen.     Left Ear: Tympanic membrane, ear canal and external ear normal. There is no impacted cerumen.     Nose: Nose normal. No congestion or rhinorrhea.     Mouth/Throat:     Mouth: Mucous membranes are moist.     Pharynx: Oropharynx is clear. Posterior oropharyngeal erythema present. No pharyngeal swelling, oropharyngeal exudate, uvula swelling or postnasal drip.  Eyes:     General: No scleral icterus.       Right eye: No discharge.        Left eye: No discharge.     Extraocular Movements: Extraocular movements intact.     Conjunctiva/sclera: Conjunctivae normal.     Pupils: Pupils are equal, round, and reactive to light.  Neck:     Vascular: No carotid bruit.  Cardiovascular:     Rate and Rhythm: Normal rate and regular rhythm.     Pulses: Normal pulses.     Heart sounds: Normal heart  sounds. No murmur heard.    No friction rub. No gallop.  Pulmonary:     Effort: Pulmonary effort is normal. No respiratory distress.  Breath sounds: Normal breath sounds. No wheezing, rhonchi or rales.  Chest:     Chest wall: No tenderness.  Abdominal:     General: Bowel sounds are normal. There is no distension.     Palpations: Abdomen is soft. There is no mass.     Tenderness: There is no abdominal tenderness. There is no right CVA tenderness, left CVA tenderness, guarding or rebound.  Musculoskeletal:     Cervical back: Normal range of motion. No rigidity or tenderness.  Lymphadenopathy:     Cervical: No cervical adenopathy.  Skin:    General: Skin is warm and dry.     Coloration: Skin is not pale.     Findings: No erythema or rash.  Neurological:     Mental Status: He is alert and oriented to person, place, and time.     Motor: No weakness.     Gait: Gait normal.  Psychiatric:        Mood and Affect: Mood normal.        Speech: Speech normal.        Behavior: Behavior normal.     Labs reviewed: Recent Labs    09/11/22 0923 10/10/22 0930 11/11/22 0959 12/03/22 1338  NA 139 137 136 139  K 4.3 4.2 5.2* 5.4*  CL 111 109 108 112*  CO2 21* 19* 20* 20  GLUCOSE 157* 137* 137* 130*  BUN 51* 57* 52* 51*  CREATININE 2.46* 2.54* 2.63* 2.85*  CALCIUM 8.8* 8.8* 8.8* 9.4  MG 1.9 1.8 1.9  --   PHOS 4.6 4.6 3.8  --    Recent Labs    03/22/22 1006 03/23/22 0333 12/03/22 1338  AST 27 26 9*  ALT 29 28 8*  ALKPHOS 67 61  --   BILITOT 0.9 0.9 0.6  PROT 6.6 6.1* 6.3  ALBUMIN 3.3* 2.8*  --    Recent Labs    10/25/22 1015 11/11/22 0959 12/03/22 1338  WBC 11.4* 10.0 12.1*  NEUTROABS 9,701* 8.5* 10,382*  HGB 10.1* 10.4* 10.3*  HCT 34.4* 35.7* 34.4*  MCV 83.5 85.4 82.5  PLT 207 203 205   Lab Results  Component Value Date   TSH 2.56 12/03/2022   Lab Results  Component Value Date   HGBA1C 6.7 (H) 12/03/2022   Lab Results  Component Value Date   CHOL 62  12/03/2022   HDL 25 (L) 12/03/2022   LDLCALC 20 12/03/2022   TRIG 90 12/03/2022   CHOLHDL 2.5 12/03/2022    Significant Diagnostic Results in last 30 days:  No results found.  Assessment/Plan Acute pharyngitis, unspecified etiology Afebrile  - POC Rapid Strep A results negative  - posterior oral pharynx erythema without exudate.  - Advised to gurgle throat with warm water and salt at least once daily  - take OTC tylenol as needed for pain  - Notify provider if symptoms worsen or fail to improve - doxycycline (VIBRA-TABS) 100 MG tablet; Take 1 tablet (100 mg total) by mouth 2 (two) times daily for 7 days.  Dispense: 14 tablet; Refill: 0  Family/ staff Communication: Reviewed plan of care with patient verbalized understanding  Labs/tests ordered: - POC Rapid Strep A  Next Appointment: Return if symptoms worsen or fail to improve.   Caesar Bookman, NP

## 2023-01-09 NOTE — Patient Instructions (Signed)
-   Notify provider if symptoms worsen or fail to improve  °

## 2023-01-20 ENCOUNTER — Other Ambulatory Visit (HOSPITAL_COMMUNITY)
Admission: RE | Admit: 2023-01-20 | Discharge: 2023-01-20 | Disposition: A | Discharge status: Home / Self Care | Payer: 59 | Source: Ambulatory Visit | Attending: Pediatric Nephrology | Admitting: Pediatric Nephrology

## 2023-01-20 DIAGNOSIS — Z940 Kidney transplant status: Secondary | ICD-10-CM | POA: Insufficient documentation

## 2023-01-20 DIAGNOSIS — D899 Disorder involving the immune mechanism, unspecified: Secondary | ICD-10-CM | POA: Insufficient documentation

## 2023-01-20 LAB — CBC WITH DIFFERENTIAL/PLATELET
Abs Immature Granulocytes: 0.05 10*3/uL (ref 0.00–0.07)
Basophils Absolute: 0 10*3/uL (ref 0.0–0.1)
Basophils Relative: 0 %
Eosinophils Absolute: 0.1 10*3/uL (ref 0.0–0.5)
Eosinophils Relative: 1 %
HCT: 36.5 % — ABNORMAL LOW (ref 39.0–52.0)
Hemoglobin: 10.5 g/dL — ABNORMAL LOW (ref 13.0–17.0)
Immature Granulocytes: 1 %
Lymphocytes Relative: 8 %
Lymphs Abs: 0.8 10*3/uL (ref 0.7–4.0)
MCH: 24.6 pg — ABNORMAL LOW (ref 26.0–34.0)
MCHC: 28.8 g/dL — ABNORMAL LOW (ref 30.0–36.0)
MCV: 85.7 fL (ref 80.0–100.0)
Monocytes Absolute: 0.7 10*3/uL (ref 0.1–1.0)
Monocytes Relative: 7 %
Neutro Abs: 8.9 10*3/uL — ABNORMAL HIGH (ref 1.7–7.7)
Neutrophils Relative %: 83 %
Platelets: 190 10*3/uL (ref 150–400)
RBC: 4.26 MIL/uL (ref 4.22–5.81)
RDW: 14.3 % (ref 11.5–15.5)
WBC: 10.6 10*3/uL — ABNORMAL HIGH (ref 4.0–10.5)
nRBC: 0 % (ref 0.0–0.2)

## 2023-01-20 LAB — URINALYSIS, W/ REFLEX TO CULTURE (INFECTION SUSPECTED)
Bilirubin Urine: NEGATIVE
Glucose, UA: >=500 mg/dL — AB
Hgb urine dipstick: NEGATIVE
Ketones, ur: NEGATIVE mg/dL
Leukocytes,Ua: NEGATIVE
Nitrite: NEGATIVE
Protein, ur: NEGATIVE mg/dL
Specific Gravity, Urine: 1.015 (ref 1.005–1.030)
pH: 5 (ref 5.0–8.0)

## 2023-01-20 LAB — BASIC METABOLIC PANEL
Anion gap: 9 (ref 5–15)
BUN: 45 mg/dL — ABNORMAL HIGH (ref 6–20)
CO2: 20 mmol/L — ABNORMAL LOW (ref 22–32)
Calcium: 9.6 mg/dL (ref 8.9–10.3)
Chloride: 109 mmol/L (ref 98–111)
Creatinine, Ser: 2.33 mg/dL — ABNORMAL HIGH (ref 0.61–1.24)
GFR, Estimated: 32 mL/min — ABNORMAL LOW (ref >60)
Glucose, Bld: 121 mg/dL — ABNORMAL HIGH (ref 70–99)
Potassium: 4.7 mmol/L (ref 3.5–5.1)
Sodium: 138 mmol/L (ref 135–145)

## 2023-01-20 LAB — MAGNESIUM: Magnesium: 1.6 mg/dL — ABNORMAL LOW (ref 1.7–2.4)

## 2023-01-20 LAB — PHOSPHORUS: Phosphorus: 3.3 mg/dL (ref 2.5–4.6)

## 2023-01-22 LAB — TACROLIMUS LEVEL: Tacrolimus (FK506) - LabCorp: 5.1 ng/mL (ref 2.0–20.0)

## 2023-01-24 ENCOUNTER — Ambulatory Visit: Payer: 59 | Admitting: Family

## 2023-01-31 DIAGNOSIS — G4733 Obstructive sleep apnea (adult) (pediatric): Secondary | ICD-10-CM | POA: Diagnosis not present

## 2023-02-03 MED ORDER — GABAPENTIN 100 MG CAPSULE
ORAL_CAPSULE | Freq: Two times a day (BID) | ORAL | 11 refills | 30 days | Status: CP
Start: 2023-02-03 — End: 2024-02-03
  Filled 2023-02-06: qty 60, 30d supply, fill #0

## 2023-02-06 MED FILL — ATORVASTATIN 40 MG TABLET: ORAL | 90 days supply | Qty: 90 | Fill #3

## 2023-02-06 MED FILL — MYCOPHENOLATE SODIUM 180 MG TABLET,DELAYED RELEASE: ORAL | 30 days supply | Qty: 180 | Fill #1

## 2023-02-11 ENCOUNTER — Other Ambulatory Visit (HOSPITAL_COMMUNITY)
Admission: RE | Admit: 2023-02-11 | Discharge: 2023-02-11 | Disposition: A | Discharge status: Home / Self Care | Payer: 59 | Source: Ambulatory Visit | Attending: Pediatric Nephrology | Admitting: Pediatric Nephrology

## 2023-02-11 DIAGNOSIS — Z940 Kidney transplant status: Secondary | ICD-10-CM | POA: Insufficient documentation

## 2023-02-11 DIAGNOSIS — D899 Disorder involving the immune mechanism, unspecified: Secondary | ICD-10-CM | POA: Insufficient documentation

## 2023-02-11 LAB — CBC WITH DIFFERENTIAL/PLATELET
Abs Immature Granulocytes: 0.09 10*3/uL — ABNORMAL HIGH (ref 0.00–0.07)
Basophils Absolute: 0 10*3/uL (ref 0.0–0.1)
Basophils Relative: 0 %
Eosinophils Absolute: 0.1 10*3/uL (ref 0.0–0.5)
Eosinophils Relative: 1 %
HCT: 37.5 % — ABNORMAL LOW (ref 39.0–52.0)
Hemoglobin: 10.8 g/dL — ABNORMAL LOW (ref 13.0–17.0)
Immature Granulocytes: 1 %
Lymphocytes Relative: 8 %
Lymphs Abs: 0.8 10*3/uL (ref 0.7–4.0)
MCH: 24.9 pg — ABNORMAL LOW (ref 26.0–34.0)
MCHC: 28.8 g/dL — ABNORMAL LOW (ref 30.0–36.0)
MCV: 86.6 fL (ref 80.0–100.0)
Monocytes Absolute: 0.7 10*3/uL (ref 0.1–1.0)
Monocytes Relative: 7 %
Neutro Abs: 8.7 10*3/uL — ABNORMAL HIGH (ref 1.7–7.7)
Neutrophils Relative %: 83 %
Platelets: 190 10*3/uL (ref 150–400)
RBC: 4.33 MIL/uL (ref 4.22–5.81)
RDW: 13.8 % (ref 11.5–15.5)
WBC: 10.4 10*3/uL (ref 4.0–10.5)
nRBC: 0 % (ref 0.0–0.2)

## 2023-02-11 LAB — BASIC METABOLIC PANEL
Anion gap: 10 (ref 5–15)
BUN: 53 mg/dL — ABNORMAL HIGH (ref 6–20)
CO2: 21 mmol/L — ABNORMAL LOW (ref 22–32)
Calcium: 9.3 mg/dL (ref 8.9–10.3)
Chloride: 107 mmol/L (ref 98–111)
Creatinine, Ser: 2.56 mg/dL — ABNORMAL HIGH (ref 0.61–1.24)
GFR, Estimated: 28 mL/min — ABNORMAL LOW (ref >60)
Glucose, Bld: 147 mg/dL — ABNORMAL HIGH (ref 70–99)
Potassium: 4.9 mmol/L (ref 3.5–5.1)
Sodium: 138 mmol/L (ref 135–145)

## 2023-02-11 LAB — MAGNESIUM: Magnesium: 1.7 mg/dL (ref 1.7–2.4)

## 2023-02-11 LAB — PHOSPHORUS: Phosphorus: 4.3 mg/dL (ref 2.5–4.6)

## 2023-02-11 MED FILL — HYDRALAZINE 100 MG TABLET: ORAL | 90 days supply | Qty: 180 | Fill #2

## 2023-02-13 LAB — TACROLIMUS LEVEL: Tacrolimus (FK506) - LabCorp: 5.7 ng/mL (ref 2.0–20.0)

## 2023-02-17 DIAGNOSIS — Z940 Kidney transplant status: Principal | ICD-10-CM

## 2023-02-18 ENCOUNTER — Institutional Professional Consult (permissible substitution)
Admit: 2023-02-18 | Discharge: 2023-02-19 | Payer: MEDICARE | Attending: Student in an Organized Health Care Education/Training Program | Primary: Student in an Organized Health Care Education/Training Program

## 2023-02-18 DIAGNOSIS — I701 Atherosclerosis of renal artery: Principal | ICD-10-CM

## 2023-02-18 DIAGNOSIS — T8619 Other complication of kidney transplant: Principal | ICD-10-CM

## 2023-02-22 MED ORDER — AMLODIPINE 10 MG TABLET
ORAL_TABLET | Freq: Every day | ORAL | 11 refills | 30 days
Start: 2023-02-22 — End: 2024-02-22

## 2023-02-24 DIAGNOSIS — Z940 Kidney transplant status: Principal | ICD-10-CM

## 2023-02-24 MED ORDER — JARDIANCE 25 MG TABLET
ORAL_TABLET | Freq: Every day | ORAL | 3 refills | 90 days | Status: CN
Start: 2023-02-24 — End: 2024-02-24

## 2023-02-24 MED ORDER — EMPAGLIFLOZIN 25 MG TABLET
ORAL_TABLET | Freq: Every day | ORAL | 3 refills | 90 days | Status: CP
Start: 2023-02-24 — End: 2024-02-24
  Filled 2023-02-25: qty 90, 90d supply, fill #0

## 2023-02-24 MED ORDER — AMLODIPINE 10 MG TABLET
ORAL_TABLET | Freq: Every day | ORAL | 3 refills | 90 days | Status: CP
Start: 2023-02-24 — End: 2024-02-24
  Filled 2023-02-25: qty 90, 90d supply, fill #0

## 2023-02-25 MED FILL — CHLORTHALIDONE 25 MG TABLET: ORAL | 30 days supply | Qty: 60 | Fill #2

## 2023-03-02 DIAGNOSIS — G4733 Obstructive sleep apnea (adult) (pediatric): Secondary | ICD-10-CM | POA: Diagnosis not present

## 2023-03-04 MED FILL — GABAPENTIN 100 MG CAPSULE: ORAL | 30 days supply | Qty: 60 | Fill #1

## 2023-03-07 ENCOUNTER — Other Ambulatory Visit: Payer: Self-pay | Admitting: Family

## 2023-03-07 DIAGNOSIS — E1140 Type 2 diabetes mellitus with diabetic neuropathy, unspecified: Secondary | ICD-10-CM

## 2023-03-09 DIAGNOSIS — Z940 Kidney transplant status: Principal | ICD-10-CM

## 2023-03-10 DIAGNOSIS — Z940 Kidney transplant status: Principal | ICD-10-CM

## 2023-03-11 ENCOUNTER — Ambulatory Visit (INDEPENDENT_AMBULATORY_CARE_PROVIDER_SITE_OTHER): Payer: 59 | Admitting: Family

## 2023-03-11 ENCOUNTER — Encounter: Payer: Self-pay | Admitting: Family

## 2023-03-11 VITALS — BP 134/72 | HR 74 | Temp 97.6°F | Resp 20 | Ht 68.0 in | Wt 226.6 lb

## 2023-03-11 DIAGNOSIS — I10 Essential (primary) hypertension: Secondary | ICD-10-CM | POA: Diagnosis not present

## 2023-03-11 DIAGNOSIS — M79602 Pain in left arm: Secondary | ICD-10-CM | POA: Diagnosis not present

## 2023-03-11 DIAGNOSIS — R200 Anesthesia of skin: Secondary | ICD-10-CM

## 2023-03-11 DIAGNOSIS — R202 Paresthesia of skin: Secondary | ICD-10-CM

## 2023-03-11 DIAGNOSIS — R040 Epistaxis: Secondary | ICD-10-CM | POA: Diagnosis not present

## 2023-03-11 NOTE — Progress Notes (Unsigned)
Provider: Richarda Blade FNP-C  Aayan Haskew, Donalee Citrin, NP  Patient Care Team: Charise Leinbach, Donalee Citrin, NP as PCP - General (Family Medicine) Jodelle Red, MD as PCP - Cardiology (Cardiology) Nadara Mustard, MD as Consulting Physician (Orthopedic Surgery) Felecia Shelling, DPM as Consulting Physician (Podiatry) [facility]  Extended Emergency Contact Information Primary Emergency Contact: [name],[name] Address: [address] [place], [place] [address] [place] Home Phone: [phone] Mobile Phone: [phone] Relation: Spouse  Code Status:  Full Code  Goals of care: Advanced Directive information    03/11/2023    1:45 PM  Advanced Directives  Does Patient Have a Medical Advance Directive? Yes  Type of Advance Directive Out of facility DNR (pink MOST or yellow form)  Does patient want to make changes to medical advance directive? No - Patient declined  Pre-existing out of facility DNR order (yellow form or pink MOST form) Yellow form placed in chart (order not valid for inpatient use)     Chief Complaint  Patient presents with  . Acute Visit    Discuss meds and numbness in left arm.    HPI:  Pt is a 57 y.o. male seen today for an acute visit for evaluation of    Past Medical History:  Diagnosis Date  . Anemia   . Arthritis   . Cancer [facility])    Renal Tumor  . CKD (chronic kidney disease)   . Coronary artery disease   . Diabetes (HCC)   . DM (diabetes mellitus) with complications (HCC)   . ESRD (end stage renal disease) (HCC)    Pre- dialysis  . History of colonoscopy 2011  . Hypertension   . Kidney transplanted 2022  . Left kidney mass   . Neuropathy   . Obesity   . Osteomyelitis, chronic, ankle or foot (HCC)   . PONV (postoperative nausea and vomiting)   . Renal insufficiency    Patient is on Dialysis and receives M,W and F.  . S/P BKA (below knee amputation) (HCC) 08/2014   Right , [place], [place]  .  Sleep apnea    CPAP  . Thyroid disease    Past Surgical History:  Procedure Laterality Date  . BASCILIC VEIN TRANSPOSITION Left 02/17/2015   Procedure: BASCILIC VEIN TRANSPOSITION;  Surgeon: Renford Dills, MD;  Location: [facility] ORS;  Service: Vascular;  Laterality: Left;  . BELOW KNEE LEG AMPUTATION Right   . CORONARY ANGIOPLASTY WITH STENT PLACEMENT    . EYE SURGERY    . FOOT SURGERY     Multiple R foot surgery for infection and charcot foot  . I & D EXTREMITY Left 04/16/2016   Procedure: IRRIGATION AND DEBRIDEMENT EXTREMITY/GREAT TOE AMP.;  Surgeon: Felecia Shelling, DPM;  [facility]: [facility];  Service: Podiatry;  Laterality: Left;  . JOINT REPLACEMENT    . LAPAROSCOPIC NEPHRECTOMY, HAND ASSISTED Left 07/11/2015   Procedure: HAND ASSISTED LAPAROSCOPIC NEPHRECTOMY;  Surgeon: Vanna Scotland, MD;  Location: [facility] ORS;  Service: Urology;  Laterality: Left;  . PERIPHERAL VASCULAR CATHETERIZATION Left 12/06/2014   Procedure: A/V Shuntogram/Fistulagram;  Surgeon: Renford Dills, MD;  Location: [facility] INVASIVE CV LAB;  Service: Cardiovascular;  Laterality: Left;  . PERIPHERAL VASCULAR CATHETERIZATION Left 12/06/2014   Procedure: A/V Shunt Intervention;  Surgeon: Renford Dills, MD;  Location: [facility] INVASIVE CV LAB;  Service: Cardiovascular;  Laterality: Left;  . TONSILLECTOMY      No Known Allergies  Outpatient Encounter Medications as of 03/11/2023  Medication Sig  . Accu-Chek Softclix Lancets lancets Test blood sugar 3 times a day. DX: E11.40  . acetaminophen (TYLENOL) 500 MG tablet Take 500 mg by mouth every 6 (six) hours as needed for moderate pain or mild pain.  Marland Kitchen allopurinol (ZYLOPRIM) 100 MG tablet Take 100 mg by mouth every other day.  Marland Kitchen amLODipine (NORVASC) 10 MG tablet Take 10 mg by mouth in the morning. 9am  . aspirin EC 81 MG tablet Take 81 mg by mouth daily. Swallow whole.  Marland Kitchen atorvastatin (LIPITOR) 40 MG tablet Take 40 mg by mouth daily. 9pm.  . Blood Glucose Monitoring Suppl  (ACCU-CHEK AVIVA PLUS) w/Device KIT Use to test blood sugar three times daily. Dx: E11.40  . carvedilol (COREG) 12.5 MG tablet Take 37.5 mg by mouth in the morning, at noon, and at bedtime. 9am , and 9pm  . chlorthalidone (HYGROTON) 25 MG tablet Take 25 mg by mouth daily.  . cholecalciferol (VITAMIN D3) 25 MCG (1000 UNIT) tablet Take 1,000 Units by mouth daily.  . empagliflozin (JARDIANCE) 10 MG TABS tablet Take 10 mg by mouth daily.  . furosemide (LASIX) 80 MG tablet Take 80 mg by mouth as needed for edema or fluid.  Marland Kitchen glucose blood test strip 1 each by Other route 3 (three) times daily. Use as instructed  . hydrALAZINE (APRESOLINE) 25 MG tablet Take 100 mg by mouth in the morning and at bedtime.  . insulin glargine (LANTUS SOLOSTAR) 100 UNIT/ML Solostar Pen INJECT 35 UNITS SUBCUTANEOUSLY ONCE DAILY BEFORE BREAKFAST  . insulin lispro (HUMALOG) 100 UNIT/ML KwikPen .03-0.17 mLs (3-17 Units total) into the skin 2 (two) times daily as needed for high blood sugar E11.40  . Insulin Pen Needle (BD PEN NEEDLE NANO 2ND GEN) 32G X 4 MM MISC Use to test blood sugar four times daily. Dx:E11.40  . levothyroxine (SYNTHROID) 75 MCG tablet Take 75 mcg by mouth daily before breakfast. 7am  . mupirocin ointment (BACTROBAN) 2 % Apply 1 Application topically 2 (two) times daily. To affected area till better  . mycophenolate (MYFORTIC) 180 MG EC tablet Take 3 tablets (540 mg total) by mouth in the morning and at bedtime. 3 tablets (540 mg total per dose) at 9am and 9pm  . Semaglutide, 1 MG/DOSE, (OZEMPIC, 1 MG/DOSE,) 4 MG/3ML SOPN INJECT 1 MG INTO THE SKIN ONCE A WEEK AT 9AM  . spironolactone (ALDACTONE) 25 MG tablet Take 50 mg by mouth every morning.  . tacrolimus ER (ENVARSUS XR) 1 MG TB24 Take 5 mg by mouth daily before breakfast.   No facility-administered encounter medications on file as of 03/11/2023.    Review of Systems  Immunization History  Administered Date(s) Administered  . Hepatitis B, ADULT  12/30/2014, 01/27/2015, 03/01/2015, 06/30/2015  . Influenza Split 07/31/2011, 01/31/2020  . Influenza, High Dose Seasonal PF 12/25/2022  . Influenza,inj,Quad PF,6+ Mos 01/19/2020, 12/27/2020, 04/26/2021, 01/16/2022  . Influenza-Unspecified 01/19/2020  . Moderna Covid-19 Fall Seasonal Vaccine 64yrs & older 04/19/2022  . Moderna Covid-19 Vaccine Bivalent Booster 80yrs & up 12/25/2022  . PFIZER(Purple Top)SARS-COV-2 Vaccination 06/11/2019, 07/07/2019, 12/31/2019, 01/31/2020, 08/31/2020, 10/11/2020  . PNEUMOCOCCAL CONJUGATE-20 01/16/2022  . [employer], [employer] 67yrs & up 04/26/2021  . Pneumococcal Polysaccharide-23 05/22/2015  . Tdap 03/01/2017   Pertinent  Health Maintenance Due  Topic Date Due  . FOOT EXAM  Never done  . HEMOGLOBIN A1C  06/02/2023  . OPHTHALMOLOGY EXAM  07/23/2023  . Colonoscopy  11/14/2031  . INFLUENZA  VACCINE  Completed      04/26/2022   10:22 AM 07/25/2022   12:57 PM 10/25/2022    8:21 AM 01/09/2023    9:16 AM 03/11/2023    1:41 PM  Fall Risk  Falls in the past year? 0 0 0 0 0  Was there an injury with Fall? 0 0 0  0  Fall Risk Category Calculator 0 0 0  0  Patient at Risk for Falls Due to No Fall Risks No Fall Risks No Fall Risks    Fall risk Follow up Falls evaluation completed  Falls evaluation completed     Functional Status Survey:    Vitals:   03/11/23 1348  BP: 134/72  Pulse: 74  Resp: 20  Temp: 97.6 F (36.4 C)  SpO2: 97%  Weight: 226 lb 9.6 oz (102.8 kg)  Height: 5\' 8"  (1.727 m)   Body mass index is 34.45 kg/m. Physical Exam  Labs reviewed: Recent Labs    11/11/22 0959 12/03/22 1338 01/20/23 1036 02/11/23 1000  NA 136 139 138 138  K 5.2* 5.4* 4.7 4.9  CL 108 112* 109 107  CO2 20* 20 20* 21*  GLUCOSE 137* 130* 121* 147*  BUN 52* 51* 45* 53*  CREATININE 2.63* 2.85* 2.33* 2.56*  CALCIUM 8.8* 9.4 9.6 9.3  MG 1.9  --  1.6* 1.7  PHOS 3.8  --  3.3 4.3   Recent Labs    03/22/22 1006 03/23/22 0333  12/03/22 1338  AST 27 26 9*  ALT 29 28 8*  ALKPHOS 67 61  --   BILITOT 0.9 0.9 0.6  PROT 6.6 6.1* 6.3  ALBUMIN 3.3* 2.8*  --    Recent Labs    12/03/22 1338 01/20/23 1036 02/11/23 1000  WBC 12.1* 10.6* 10.4  NEUTROABS 10,382* 8.9* 8.7*  HGB 10.3* 10.5* 10.8*  HCT 34.4* 36.5* 37.5*  MCV 82.5 85.7 86.6  PLT 205 190 190   Lab Results  Component Value Date   TSH 2.56 12/03/2022   Lab Results  Component Value Date   HGBA1C 6.7 (H) 12/03/2022   Lab Results  Component Value Date   CHOL 62 12/03/2022   HDL 25 (L) 12/03/2022   LDLCALC 20 12/03/2022   TRIG 90 12/03/2022   CHOLHDL 2.5 12/03/2022    Significant Diagnostic Results in last 30 days:  No results found.  Assessment/Plan There are no diagnoses linked to this encounter.   Family/ staff Communication: Reviewed plan of care with patient  Labs/tests ordered: None   Next Appointment:   Caesar Bookman, NP

## 2023-03-11 NOTE — Patient Instructions (Signed)
-   Advised to check Blood pressure at home and record on log provided and notify provider if B/p > 140/90 - Upload B/p on the Mychart for provider to evaluate

## 2023-03-12 MED FILL — MYCOPHENOLATE SODIUM 180 MG TABLET,DELAYED RELEASE: ORAL | 30 days supply | Qty: 180 | Fill #2

## 2023-03-13 ENCOUNTER — Other Ambulatory Visit (HOSPITAL_COMMUNITY)
Admission: RE | Admit: 2023-03-13 | Discharge: 2023-03-13 | Disposition: A | Discharge status: Home / Self Care | Payer: 59 | Source: Ambulatory Visit | Attending: Pediatric Nephrology | Admitting: Pediatric Nephrology

## 2023-03-13 DIAGNOSIS — Z940 Kidney transplant status: Secondary | ICD-10-CM | POA: Insufficient documentation

## 2023-03-13 LAB — CBC WITH DIFFERENTIAL/PLATELET
Abs Immature Granulocytes: 0.06 10*3/uL (ref 0.00–0.07)
Basophils Absolute: 0 10*3/uL (ref 0.0–0.1)
Basophils Relative: 0 %
Eosinophils Absolute: 0.1 10*3/uL (ref 0.0–0.5)
Eosinophils Relative: 1 %
HCT: 38.1 % — ABNORMAL LOW (ref 39.0–52.0)
Hemoglobin: 10.8 g/dL — ABNORMAL LOW (ref 13.0–17.0)
Immature Granulocytes: 1 %
Lymphocytes Relative: 7 %
Lymphs Abs: 0.7 10*3/uL (ref 0.7–4.0)
MCH: 24.8 pg — ABNORMAL LOW (ref 26.0–34.0)
MCHC: 28.3 g/dL — ABNORMAL LOW (ref 30.0–36.0)
MCV: 87.6 fL (ref 80.0–100.0)
Monocytes Absolute: 0.7 10*3/uL (ref 0.1–1.0)
Monocytes Relative: 7 %
Neutro Abs: 8.3 10*3/uL — ABNORMAL HIGH (ref 1.7–7.7)
Neutrophils Relative %: 84 %
Platelets: 197 10*3/uL (ref 150–400)
RBC: 4.35 MIL/uL (ref 4.22–5.81)
RDW: 13.9 % (ref 11.5–15.5)
WBC: 9.9 10*3/uL (ref 4.0–10.5)
nRBC: 0 % (ref 0.0–0.2)

## 2023-03-13 LAB — MAGNESIUM: Magnesium: 1.9 mg/dL (ref 1.7–2.4)

## 2023-03-13 LAB — BASIC METABOLIC PANEL
Anion gap: 8 (ref 5–15)
BUN: 60 mg/dL — ABNORMAL HIGH (ref 6–20)
CO2: 21 mmol/L — ABNORMAL LOW (ref 22–32)
Calcium: 9.1 mg/dL (ref 8.9–10.3)
Chloride: 108 mmol/L (ref 98–111)
Creatinine, Ser: 2.49 mg/dL — ABNORMAL HIGH (ref 0.61–1.24)
GFR, Estimated: 29 mL/min — ABNORMAL LOW (ref >60)
Glucose, Bld: 160 mg/dL — ABNORMAL HIGH (ref 70–99)
Potassium: 4.6 mmol/L (ref 3.5–5.1)
Sodium: 137 mmol/L (ref 135–145)

## 2023-03-13 LAB — PHOSPHORUS: Phosphorus: 4.2 mg/dL (ref 2.5–4.6)

## 2023-03-14 ENCOUNTER — Ambulatory Visit (HOSPITAL_COMMUNITY)
Admission: RE | Admit: 2023-03-14 | Discharge: 2023-03-14 | Disposition: A | Discharge status: Home / Self Care | Payer: 59 | Source: Ambulatory Visit | Attending: Family | Admitting: Family

## 2023-03-14 DIAGNOSIS — Z940 Kidney transplant status: Principal | ICD-10-CM

## 2023-03-14 DIAGNOSIS — R202 Paresthesia of skin: Secondary | ICD-10-CM | POA: Diagnosis not present

## 2023-03-14 DIAGNOSIS — M79602 Pain in left arm: Secondary | ICD-10-CM | POA: Diagnosis not present

## 2023-03-14 DIAGNOSIS — R200 Anesthesia of skin: Secondary | ICD-10-CM | POA: Diagnosis not present

## 2023-03-16 LAB — TACROLIMUS LEVEL: Tacrolimus (FK506) - LabCorp: 7.6 ng/mL (ref 2.0–20.0)

## 2023-03-18 DIAGNOSIS — G4733 Obstructive sleep apnea (adult) (pediatric): Secondary | ICD-10-CM | POA: Diagnosis not present

## 2023-03-19 ENCOUNTER — Other Ambulatory Visit: Payer: Self-pay | Admitting: Family

## 2023-03-19 DIAGNOSIS — M79602 Pain in left arm: Secondary | ICD-10-CM

## 2023-03-27 DIAGNOSIS — I151 Hypertension secondary to other renal disorders: Principal | ICD-10-CM

## 2023-03-27 DIAGNOSIS — Z940 Kidney transplant status: Principal | ICD-10-CM

## 2023-03-27 MED ORDER — CARVEDILOL 12.5 MG TABLET
ORAL_TABLET | Freq: Two times a day (BID) | ORAL | 11 refills | 30.00 days | Status: CP
Start: 2023-03-27 — End: 2024-03-21

## 2023-03-27 MED ORDER — SPIRONOLACTONE 25 MG TABLET
ORAL_TABLET | Freq: Every day | ORAL | 3 refills | 90.00 days | Status: CP
Start: 2023-03-27 — End: 2024-03-26

## 2023-03-27 MED ORDER — ATORVASTATIN 40 MG TABLET
ORAL_TABLET | Freq: Every day | ORAL | 3 refills | 90 days | Status: CP
Start: 2023-03-27 — End: 2024-03-26
  Filled 2023-05-01: qty 90, 90d supply, fill #0

## 2023-03-28 MED FILL — CHLORTHALIDONE 25 MG TABLET: ORAL | 30 days supply | Qty: 60 | Fill #3

## 2023-03-28 MED FILL — ALLOPURINOL 100 MG TABLET: ORAL | 90 days supply | Qty: 45 | Fill #1

## 2023-04-01 ENCOUNTER — Ambulatory Visit: Payer: 59

## 2023-04-02 DIAGNOSIS — G4733 Obstructive sleep apnea (adult) (pediatric): Secondary | ICD-10-CM | POA: Diagnosis not present

## 2023-04-02 HISTORY — PX: IR ANGIOGRAM FOLLOW UP STUDY: IMG697

## 2023-04-05 DIAGNOSIS — G4733 Obstructive sleep apnea (adult) (pediatric): Secondary | ICD-10-CM | POA: Diagnosis not present

## 2023-04-06 DIAGNOSIS — Z940 Kidney transplant status: Principal | ICD-10-CM

## 2023-04-08 DIAGNOSIS — I151 Hypertension secondary to other renal disorders: Principal | ICD-10-CM

## 2023-04-08 DIAGNOSIS — Z940 Kidney transplant status: Principal | ICD-10-CM

## 2023-04-08 MED ORDER — CARVEDILOL 12.5 MG TABLET
ORAL_TABLET | Freq: Two times a day (BID) | ORAL | 11 refills | 30.00 days | Status: CP
Start: 2023-04-08 — End: 2024-04-02
  Filled 2023-04-14: qty 180, 30d supply, fill #0

## 2023-04-08 MED ORDER — SPIRONOLACTONE 25 MG TABLET
ORAL_TABLET | Freq: Every day | ORAL | 3 refills | 90.00 days | Status: CP
Start: 2023-04-08 — End: 2024-04-07
  Filled 2023-04-14: qty 180, 90d supply, fill #0

## 2023-04-08 MED ORDER — GABAPENTIN 100 MG CAPSULE
ORAL_CAPSULE | Freq: Two times a day (BID) | ORAL | 11 refills | 30.00 days | Status: CP
Start: 2023-04-08 — End: 2024-04-07
  Filled 2023-04-14: qty 60, 30d supply, fill #0

## 2023-04-14 ENCOUNTER — Other Ambulatory Visit (HOSPITAL_COMMUNITY)
Admission: RE | Admit: 2023-04-14 | Discharge: 2023-04-14 | Disposition: A | Discharge status: Home / Self Care | Payer: 59 | Source: Ambulatory Visit | Attending: Pediatric Nephrology | Admitting: Pediatric Nephrology

## 2023-04-14 DIAGNOSIS — Z940 Kidney transplant status: Secondary | ICD-10-CM | POA: Diagnosis not present

## 2023-04-14 DIAGNOSIS — D899 Disorder involving the immune mechanism, unspecified: Secondary | ICD-10-CM | POA: Diagnosis not present

## 2023-04-14 LAB — CBC WITH DIFFERENTIAL/PLATELET
Abs Immature Granulocytes: 0.05 10*3/uL (ref 0.00–0.07)
Basophils Absolute: 0 10*3/uL (ref 0.0–0.1)
Basophils Relative: 0 %
Eosinophils Absolute: 0.1 10*3/uL (ref 0.0–0.5)
Eosinophils Relative: 1 %
HCT: 35.8 % — ABNORMAL LOW (ref 39.0–52.0)
Hemoglobin: 10.8 g/dL — ABNORMAL LOW (ref 13.0–17.0)
Immature Granulocytes: 1 %
Lymphocytes Relative: 8 %
Lymphs Abs: 0.8 10*3/uL (ref 0.7–4.0)
MCH: 25.6 pg — ABNORMAL LOW (ref 26.0–34.0)
MCHC: 30.2 g/dL (ref 30.0–36.0)
MCV: 84.8 fL (ref 80.0–100.0)
Monocytes Absolute: 0.6 10*3/uL (ref 0.1–1.0)
Monocytes Relative: 6 %
Neutro Abs: 8 10*3/uL — ABNORMAL HIGH (ref 1.7–7.7)
Neutrophils Relative %: 84 %
Platelets: 183 10*3/uL (ref 150–400)
RBC: 4.22 MIL/uL (ref 4.22–5.81)
RDW: 13.9 % (ref 11.5–15.5)
WBC: 9.5 10*3/uL (ref 4.0–10.5)
nRBC: 0 % (ref 0.0–0.2)

## 2023-04-14 LAB — BASIC METABOLIC PANEL
Anion gap: 9 (ref 5–15)
BUN: 55 mg/dL — ABNORMAL HIGH (ref 6–20)
CO2: 23 mmol/L (ref 22–32)
Calcium: 9.3 mg/dL (ref 8.9–10.3)
Chloride: 106 mmol/L (ref 98–111)
Creatinine, Ser: 2.45 mg/dL — ABNORMAL HIGH (ref 0.61–1.24)
GFR, Estimated: 30 mL/min — ABNORMAL LOW (ref >60)
Glucose, Bld: 157 mg/dL — ABNORMAL HIGH (ref 70–99)
Potassium: 4.6 mmol/L (ref 3.5–5.1)
Sodium: 138 mmol/L (ref 135–145)

## 2023-04-14 LAB — MAGNESIUM: Magnesium: 2.2 mg/dL (ref 1.7–2.4)

## 2023-04-14 LAB — PHOSPHORUS: Phosphorus: 4 mg/dL (ref 2.5–4.6)

## 2023-04-14 MED FILL — MYCOPHENOLATE SODIUM 180 MG TABLET,DELAYED RELEASE: ORAL | 30 days supply | Qty: 180 | Fill #3

## 2023-04-16 ENCOUNTER — Inpatient Hospital Stay: Admit: 2023-04-16 | Discharge: 2023-04-16 | Payer: MEDICARE

## 2023-04-16 DIAGNOSIS — I701 Atherosclerosis of renal artery: Principal | ICD-10-CM

## 2023-04-16 DIAGNOSIS — T8619 Other complication of kidney transplant: Principal | ICD-10-CM

## 2023-04-16 DIAGNOSIS — Z940 Kidney transplant status: Principal | ICD-10-CM

## 2023-04-16 DIAGNOSIS — I129 Hypertensive chronic kidney disease with stage 1 through stage 4 chronic kidney disease, or unspecified chronic kidney disease: Secondary | ICD-10-CM | POA: Diagnosis not present

## 2023-04-16 DIAGNOSIS — E1122 Type 2 diabetes mellitus with diabetic chronic kidney disease: Secondary | ICD-10-CM | POA: Diagnosis not present

## 2023-04-16 DIAGNOSIS — N189 Chronic kidney disease, unspecified: Secondary | ICD-10-CM | POA: Diagnosis not present

## 2023-04-16 DIAGNOSIS — I2510 Atherosclerotic heart disease of native coronary artery without angina pectoris: Secondary | ICD-10-CM | POA: Diagnosis not present

## 2023-04-16 LAB — TACROLIMUS LEVEL: Tacrolimus (FK506) - LabCorp: 8 ng/mL (ref 2.0–20.0)

## 2023-04-17 DIAGNOSIS — Z940 Kidney transplant status: Principal | ICD-10-CM

## 2023-04-17 MED ORDER — TACROLIMUS XR 1 MG TABLET,EXTENDED RELEASE 24 HR
ORAL_TABLET | Freq: Every day | ORAL | 3 refills | 90.00 days | Status: CP
Start: 2023-04-17 — End: 2024-04-16

## 2023-04-21 ENCOUNTER — Telehealth (INDEPENDENT_AMBULATORY_CARE_PROVIDER_SITE_OTHER): Payer: Self-pay | Admitting: Otolaryngology

## 2023-04-21 NOTE — Telephone Encounter (Signed)
Reminder Call: Date: 04/22/2023 Status: Sch  Time: 9:15 AM [address] [place], [place] [address]  Left Voicemail with address

## 2023-04-22 ENCOUNTER — Encounter (INDEPENDENT_AMBULATORY_CARE_PROVIDER_SITE_OTHER): Payer: Self-pay

## 2023-04-22 ENCOUNTER — Encounter (INDEPENDENT_AMBULATORY_CARE_PROVIDER_SITE_OTHER): Payer: Self-pay | Admitting: Otolaryngology

## 2023-04-22 ENCOUNTER — Ambulatory Visit (INDEPENDENT_AMBULATORY_CARE_PROVIDER_SITE_OTHER): Payer: 59 | Admitting: Otolaryngology

## 2023-04-22 VITALS — BP 142/69 | HR 70 | Resp 19 | Wt 226.0 lb

## 2023-04-22 DIAGNOSIS — R040 Epistaxis: Secondary | ICD-10-CM

## 2023-04-22 DIAGNOSIS — R0981 Nasal congestion: Secondary | ICD-10-CM

## 2023-04-22 MED ORDER — AYR SALINE NASAL NA GEL: 1.0000 | NASAL | 5 refills | Status: DC

## 2023-04-22 MED ORDER — MUPIROCIN 2 % EX OINT: 1.0000 | TOPICAL_OINTMENT | Freq: Two times a day (BID) | CUTANEOUS | 0 refills | Status: DC

## 2023-04-22 NOTE — Patient Instructions (Signed)
Use mupirocin ointment - pea sized amount on end of pinkie finger and apply just to inside of nostril twice daily for 10 days Ayr gel --- use multiple times per day in each nostril until follow up Use ayr spray/ocean spray multiple times per day as well Increase your CPAP humidification Follow up in 6 weeks

## 2023-04-22 NOTE — Progress Notes (Signed)
Dear Dr. Elam Dutch, Here is my assessment for our mutual patient, Tim Becker. Thank you for allowing me the opportunity to care for your patient. Please do not hesitate to contact me should you have any other questions. Sincerely, Dr. Jovita Kussmaul  Otolaryngology Clinic Note Referring provider: Dr. Elam Dutch HPI:  Tim Becker is a 58 y.o. male kindly referred by Dr. Elam Dutch for evaluation of epistaxis.  Initial visit (04/2023): Epistaxis history: started last year, saw [facility] [facility], noted dry nose -- Rx mupirocin; he reports it stopped for a little bit, then started again; He does use a Nasal CPAP. He does think it is getting more frequent HTN: yes Frequency: daily - does not really drip but will blow out clots Side: both but started on right and now on left CKD/Liver dysfunction: CKD/Kidney Dysfunction Anticoagulation/AP: ASA 81 Trauma: no History of Sinusitis: no Nasal obstruction: no Nasal procedures: no Current nasal medication use: saline spray; no irrigation, no flonase No history of frequent sinusitis  Personal or FHx of bleeding dz or anesthesia difficulty: no   GLP-1: Ozempic AP/AC: ASA 81  PMHx: HTN, CAD, CKD on HD s/p renal transplant, Anemia, T2DM  Tobacco: no.   Independent Review of Additional Tests or Records:  Dr. Lilla Becker (03/11/2023): noted blood clots from nose every time he wakes up; uses CPAP; very dry nose; used sprays without relief Tim Becker (05/17/2021) - ENT; clear mucus, blowing nose; mild epistaxis b/l; using CPAP; no nasal sprays; noted dry nasal mucosa; Rx: mupirocin and humidify CPAP; will check CT 05/31/2021 CT Sinus: No significant sinonasal opacification CT Neck 10/30/2017 independently reviewed and interpreted with attention to nasal cavity: no sinus opacification; limited eval CBC and BMP (04/16/2023 and 04/14/2023): Plt 183, Hgb 10.8, WBC 9.5, Eos 100; BMP: Cr 2.45, BUN 55 PMH/Meds/All/SocHx/FamHx/ROS:   Past Medical History:  Diagnosis Date    Anemia    Arthritis    Cancer (HCC)    Renal Tumor   CKD (chronic kidney disease)    Coronary artery disease    Diabetes (HCC)    DM (diabetes mellitus) with complications (HCC)    ESRD (end stage renal disease) (HCC)    Pre- dialysis   History of colonoscopy 2011   Hypertension    Kidney transplanted 2022   Left kidney mass    Neuropathy    Obesity    Osteomyelitis, chronic, ankle or foot (HCC)    PONV (postoperative nausea and vomiting)    Renal insufficiency    Patient is on Dialysis and receives M,W and F.   S/P BKA (below knee amputation) (HCC) 08/2014   Right , [place] [place], [place]   Sleep apnea    CPAP   Thyroid disease      Past Surgical History:  Procedure Laterality Date   BASCILIC VEIN TRANSPOSITION Left 02/17/2015   Procedure: BASCILIC VEIN TRANSPOSITION;  Surgeon: Renford Dills, MD;  Location: [facility] ORS;  Service: Vascular;  Laterality: Left;   BELOW KNEE LEG AMPUTATION Right    CORONARY ANGIOPLASTY WITH STENT PLACEMENT     EYE SURGERY     FOOT SURGERY     Multiple R foot surgery for infection and charcot foot   I & D EXTREMITY Left 04/16/2016   Procedure: IRRIGATION AND DEBRIDEMENT EXTREMITY/GREAT TOE AMP.;  Surgeon: Felecia Shelling, DPM;  Location: [facility] OR;  Service: Podiatry;  Laterality: Left;   JOINT REPLACEMENT     LAPAROSCOPIC NEPHRECTOMY, HAND ASSISTED Left 07/11/2015   Procedure: HAND ASSISTED LAPAROSCOPIC NEPHRECTOMY;  Surgeon:  Vanna Scotland, MD;  Location: [facility] ORS;  Service: Urology;  Laterality: Left;   PERIPHERAL VASCULAR CATHETERIZATION Left 12/06/2014   Procedure: A/V Shuntogram/Fistulagram;  Surgeon: Renford Dills, MD;  Location: [facility] INV[facility]IVE CV LAB;  Service: Cardiovascular;  Laterality: Left;   PERIPHERAL VASCULAR CATHETERIZATION Left 12/06/2014   Procedure: A/V Shunt Intervention;  Surgeon: Renford Dills, MD;  Location: [facility] INVASIVE CV LAB;  Service: Cardiovascular;  Laterality: Left;   TONSILLECTOMY      Family History  Problem  Relation Age of Onset   Diabetes Mother    Kidney disease Mother    Breast cancer Mother      Social Connections: Not on file      Current Outpatient Medications:    Accu-Chek Softclix Lancets lancets, Test blood sugar 3 times a day. DX: E11.40, Disp: 100 each, Rfl: 3   acetaminophen (TYLENOL) 500 MG tablet, Take 500 mg by mouth every 6 (six) hours as needed for moderate pain or mild pain., Disp: , Rfl:    allopurinol (ZYLOPRIM) 100 MG tablet, Take 100 mg by mouth every other day., Disp: , Rfl:    amLODipine (NORVASC) 10 MG tablet, Take 10 mg by mouth in the morning. 9am, Disp: , Rfl:    aspirin EC 81 MG tablet, Take 81 mg by mouth daily. Swallow whole., Disp: , Rfl:    atorvastatin (LIPITOR) 40 MG tablet, Take 40 mg by mouth daily. 9pm., Disp: , Rfl:    Blood Glucose Monitoring Suppl (ACCU-CHEK AVIVA PLUS) w/Device KIT, Use to test blood sugar three times daily. Dx: E11.40, Disp: 1 kit, Rfl: 0   carvedilol (COREG) 12.5 MG tablet, Take 37.5 mg by mouth in the morning, at noon, and at bedtime. 9am , and 9pm, Disp: , Rfl:    chlorthalidone (HYGROTON) 25 MG tablet, Take 25 mg by mouth daily., Disp: , Rfl:    cholecalciferol (VITAMIN D3) 25 MCG (1000 UNIT) tablet, Take 1,000 Units by mouth daily., Disp: , Rfl:    empagliflozin (JARDIANCE) 10 MG TABS tablet, Take 10 mg by mouth daily., Disp: , Rfl:    furosemide (LASIX) 80 MG tablet, Take 80 mg by mouth as needed for edema or fluid., Disp: , Rfl:    gabapentin (NEURONTIN) 100 MG capsule, Take 100 mg by mouth 2 (two) times daily., Disp: , Rfl:    glucose blood test strip, 1 each by Other route 3 (three) times daily. Use as instructed, Disp: 100 each, Rfl: 12   hydrALAZINE (APRESOLINE) 25 MG tablet, Take 100 mg by mouth in the morning and at bedtime., Disp: , Rfl:    insulin glargine (LANTUS SOLOSTAR) 100 UNIT/ML Solostar Pen, INJECT 35 UNITS SUBCUTANEOUSLY ONCE DAILY BEFORE BREAKFAST, Disp: 15 mL, Rfl: 11   insulin lispro (HUMALOG) 100 UNIT/ML  KwikPen, .03-0.17 mLs (3-17 Units total) into the skin 2 (two) times daily as needed for high blood sugar E11.40, Disp: 15 mL, Rfl: 11   Insulin Pen Needle (BD PEN NEEDLE NANO 2ND GEN) 32G X 4 MM MISC, Use to test blood sugar four times daily. Dx:E11.40, Disp: 400 each, Rfl: 5   levothyroxine (SYNTHROID) 75 MCG tablet, Take 75 mcg by mouth daily before breakfast. 7am, Disp: , Rfl:    mycophenolate (MYFORTIC) 180 MG EC tablet, Take 3 tablets (540 mg total) by mouth in the morning and at bedtime. 3 tablets (540 mg total per dose) at 9am and 9pm, Disp: , Rfl:    saline (AYR) GEL, Place 1 Application into both nostrils  every 4 (four) hours., Disp: 14 g, Rfl: 5   Semaglutide, 1 MG/DOSE, (OZEMPIC, 1 MG/DOSE,) 4 MG/3ML SOPN, INJECT 1 MG INTO THE SKIN ONCE A WEEK AT 9AM, Disp: 3 mL, Rfl: 11   spironolactone (ALDACTONE) 25 MG tablet, Take 50 mg by mouth every morning., Disp: , Rfl:    tacrolimus ER (ENVARSUS XR) 1 MG TB24, Take 5 mg by mouth daily before breakfast., Disp: , Rfl:    mupirocin ointment (BACTROBAN) 2 %, Apply 1 Application topically 2 (two) times daily. To affected area till better, Disp: 22 g, Rfl: 0   Physical Exam:   BP (!) 142/69 (BP Location: Right Arm, Patient Position: Sitting, Cuff Size: Normal)   Pulse 70   Resp 19   Wt 226 lb (102.5 kg)   SpO2 97%   BMI 34.36 kg/m   Salient findings:  CN II-XII intact  Bilateral EAC clear and TM intact with well pneumatized middle ear spaces Anterior rhinoscopy: Septum dev left, bilateral modest bloody crusting and bilateral ITH; nasal cavity seems dry; bilateral inferior turbinates without significant hypertrophy; Nasal endoscopy was indicated to better evaluate the nose and paranasal sinuses, given the patient's history and exam findings, and is detailed below. No lesions of oral cavity/oropharynx No obviously palpable neck masses/lymphadenopathy/thyromegaly No respiratory distress or stridor  Seprately Identifiable Procedures:   PROCEDURE: Bilateral Diagnostic Rigid Nasal Endoscopy Pre-procedure diagnosis: Epistaxis, concern for structural lesion Post-procedure diagnosis: same Indication: See pre-procedure diagnosis and physical exam above Complications: None apparent EBL: 0 mL Anesthesia: Lidocaine 4% and topical decongestant was topically sprayed in each nasal cavity  Description of Procedure:  Patient was identified. A rigid 30 degree endoscope was utilized to evaluate the sinonasal cavities, mucosa, sinus ostia and turbinates and septum.  Overall, signs of mucosal inflammation are noted.  Also noted are bilateral crusting on septum which is bloody and some prominent vessels left septum; no active epistaxis.  No mucopurulence, polyps, or masses noted.   Right Middle meatus: clear Right SE Recess: clear Left MM: clear Left SE Recess: clear  CPT CODE -- 91478 - Mod 25   Impression & Plans:  Tim Becker is a 58 y.o. male with CKD now with:  1. Epistaxis   2. Nasal congestion    He is continuing to have some bloody crusting and drainage and now worsening and on both sides; Endo reassuring overall and seems likely due to dry nasal cavity and CPAP use certainly does not help. As such, we discussed options and will manage with nasal humidification currently until mucosa appears better. If prominent vessels remain, can consider cautery at that point but he'd have to be off CPAP for a few days for that afterwards  - Restart mupirocin BID each nostril x10d - q4h AYR Gel especially at night - Also can use ocean spray multiple times per day x6 weeks - He will contact PCP office for increase in CPAP humidification - fu in 6 weeks  See below regarding exact medications prescribed this encounter including dosages and route: Meds ordered this encounter  Medications   mupirocin ointment (BACTROBAN) 2 %    Sig: Apply 1 Application topically 2 (two) times daily. To affected area till better    Dispense:  22 g     Refill:  0   saline (AYR) GEL    Sig: Place 1 Application into both nostrils every 4 (four) hours.    Dispense:  14 g    Refill:  5  Thank you for allowing me the opportunity to care for your patient. Please do not hesitate to contact me should you have any other questions.  Sincerely, Jovita Kussmaul, MD Otolarynoglogist (ENT), [facility] Phone: [phone] Fax: [phone]  04/22/2023, 12:56 PM   MDM:  Level 4 - 99204 Complexity/Problems addressed: mod - chronic problem, worsening Data complexity: mod - independent review and interpretation of imaging; review of notes, labs - Morbidity: mod  - Prescription Drug prescribed or managed: yes

## 2023-04-28 ENCOUNTER — Ambulatory Visit (INDEPENDENT_AMBULATORY_CARE_PROVIDER_SITE_OTHER): Payer: 59 | Admitting: Family

## 2023-04-28 ENCOUNTER — Encounter: Payer: Self-pay | Admitting: Family

## 2023-04-28 VITALS — BP 138/70 | HR 83 | Temp 97.8°F | Resp 20 | Ht 68.0 in | Wt 225.4 lb

## 2023-04-28 DIAGNOSIS — E1140 Type 2 diabetes mellitus with diabetic neuropathy, unspecified: Secondary | ICD-10-CM

## 2023-04-28 DIAGNOSIS — L853 Xerosis cutis: Secondary | ICD-10-CM | POA: Diagnosis not present

## 2023-04-28 DIAGNOSIS — Z794 Long term (current) use of insulin: Secondary | ICD-10-CM

## 2023-04-28 DIAGNOSIS — I10 Essential (primary) hypertension: Secondary | ICD-10-CM

## 2023-04-28 DIAGNOSIS — N189 Chronic kidney disease, unspecified: Secondary | ICD-10-CM | POA: Diagnosis not present

## 2023-04-28 DIAGNOSIS — E785 Hyperlipidemia, unspecified: Secondary | ICD-10-CM | POA: Diagnosis not present

## 2023-04-28 DIAGNOSIS — D631 Anemia in chronic kidney disease: Secondary | ICD-10-CM | POA: Diagnosis not present

## 2023-04-28 DIAGNOSIS — E039 Hypothyroidism, unspecified: Secondary | ICD-10-CM | POA: Diagnosis not present

## 2023-04-28 MED ORDER — TRIAMCINOLONE ACETONIDE 0.1 % EX CREA: 1.0000 | TOPICAL_CREAM | Freq: Two times a day (BID) | CUTANEOUS | 0 refills | Status: AC

## 2023-04-28 MED ORDER — LANTUS SOLOSTAR 100 UNIT/ML ~~LOC~~ SOPN: 35.0000 [IU] | PEN_INJECTOR | Freq: Every day | SUBCUTANEOUS | 11 refills | Status: DC

## 2023-04-28 NOTE — Progress Notes (Signed)
Provider: Richarda Blade FNP-C   Adellyn Capek, Donalee Citrin, NP  Patient Care Team: Korynn Kenedy, Donalee Citrin, NP as PCP - General (Family Medicine) Jodelle Red, MD as PCP - Cardiology (Cardiology) Nadara Mustard, MD as Consulting Physician (Orthopedic Surgery) Felecia Shelling, DPM as Consulting Physician (Podiatry) [facility]  Extended Emergency Contact Information Primary Emergency Contact: [name],[name] Address: [address] [place], [place] [address] [place] Home Phone: [phone] Mobile Phone: [phone] Relation: Spouse  Code Status:  Full Code Goals of care: Advanced Directive information    04/28/2023    9:05 AM  Advanced Directives  Does Patient Have a Medical Advance Directive? Yes  Type of Advance Directive Out of facility DNR (pink MOST or yellow form)  Does patient want to make changes to medical advance directive? No - Patient declined  Pre-existing out of facility DNR order (yellow form or pink MOST form) Yellow form placed in chart (order not valid for inpatient use)     Chief Complaint  Patient presents with   Medical Management of Chronic Issues    6 month follow up and discuss foot exam,covid and shingles vaccines.    HPI:  Pt is a 58 y.o. male seen today for 6 months follow up for  medical management of chronic diseases.  Discussed the use of AI scribe software for clinical note transcription with the patient, who gave verbal consent to proceed.   The patient, with a history of kidney disease and diabetes, presented for a 54-month follow-up visit. They recently underwent an angiography procedure, which revealed a blocked artery to the kidney. The artery was successfully opened, and the patient reported feeling better since the procedure.  The patient also reported a history of recurrent nosebleeds, which were evaluated by an ENT specialist. The nosebleeds were attributed to dryness, and the patient was  prescribed an antibacterial ointment mupirocin and advised to use a humidifier. The patient reported a decrease in the frequency of nosebleeds since increasing the humidifier setting for his CPAP from 4 to 6 and using the prescribed mupirocin ointment.  The patient's diabetes management includes a regimen of Ozempic, Humalog, and Lantus. They reported blood sugars ranging from 160 to 200, with a recent A1C of 7.1 done by [employer] provider. The patient also takes several medications for blood pressure control, including spironolactone, hydralazine, amlodipine, and chlorthalidone. They reported a recent improvement in blood pressure readings since the angiography procedure.  The patient has a history of gout, managed with allopurinol, and reported no recent flare-ups. They also have neuropathy, with decreased sensation in the feet but no recent wounds or sores. The patient reported a history of a toe amputation and noted some bending of the remaining toes follows up with podiatrist.  The patient also reported dry skin in the areas where they administer insulin injections. They have been using a cream for this, but it has not resolved the issue. The patient also reported a history of thyroid disease, managed with Synthroid.  The patient has not been on dialysis and has not had any recent falls. They reported no recent changes in bowel habits and no recent issues with constipation or diarrhea. The patient has not been taking aspirin recently due to concerns about its potential contribution to their nosebleeds.         Past Medical History:  Diagnosis Date   Anemia    Arthritis    Cancer (HCC)  Renal Tumor   CKD (chronic kidney disease)    Coronary artery disease    Diabetes (HCC)    DM (diabetes mellitus) with complications (HCC)    ESRD (end stage renal disease) (HCC)    Pre- dialysis   History of colonoscopy 2011   Hypertension    Kidney transplanted 2022   Left kidney mass     Neuropathy    Obesity    Osteomyelitis, chronic, ankle or foot (HCC)    PONV (postoperative nausea and vomiting)    Renal insufficiency    Patient is on Dialysis and receives M,W and F.   S/P BKA (below knee amputation) (HCC) 08/2014   Right , [facility], [place]   Sleep apnea    CPAP   Thyroid disease    Past Surgical History:  Procedure Laterality Date   BASCILIC VEIN TRANSPOSITION Left 02/17/2015   Procedure: BASCILIC VEIN TRANSPOSITION;  Surgeon: Renford Dills, MD;  Location: [facility] ORS;  Service: Vascular;  Laterality: Left;   BELOW KNEE LEG AMPUTATION Right    CORONARY ANGIOPLASTY WITH STENT PLACEMENT     EYE SURGERY     FOOT SURGERY     Multiple R foot surgery for infection and charcot foot   I & D EXTREMITY Left 04/16/2016   Procedure: IRRIGATION AND DEBRIDEMENT EXTREMITY/GREAT TOE AMP.;  Surgeon: Felecia Shelling, DPM;  Location: [facility] OR;  Service: Podiatry;  Laterality: Left;   IR ANGIOGRAM FOLLOW UP STUDY  2025   JOINT REPLACEMENT     LAPAROSCOPIC NEPHRECTOMY, HAND ASSISTED Left 07/11/2015   Procedure: HAND ASSISTED LAPAROSCOPIC NEPHRECTOMY;  Surgeon: Vanna Scotland, MD;  Location: [facility] ORS;  Service: Urology;  Laterality: Left;   PERIPHERAL VASCULAR CATHETERIZATION Left 12/06/2014   Procedure: A/V Shuntogram/Fistulagram;  Surgeon: Renford Dills, MD;  Location: [facility] INVASIVE CV LAB;  Service: Cardiovascular;  Laterality: Left;   PERIPHERAL VASCULAR CATHETERIZATION Left 12/06/2014   Procedure: A/V Shunt Intervention;  Surgeon: Renford Dills, MD;  Location: [facility] INVASIVE CV LAB;  Service: Cardiovascular;  Laterality: Left;   TONSILLECTOMY      No Known Allergies  Allergies as of 04/28/2023   No Known Allergies      Medication List        Accurate as of April 28, 2023  9:26 AM. If you have any questions, ask your nurse or doctor.          Accu-Chek Aviva Plus w/Device Kit Use to test blood sugar three times daily. Dx: E11.40   Accu-Chek Softclix  Lancets lancets Test blood sugar 3 times a day. DX: E11.40   acetaminophen 500 MG tablet Commonly known as: TYLENOL Take 500 mg by mouth every 6 (six) hours as needed for moderate pain or mild pain.   allopurinol 100 MG tablet Commonly known as: ZYLOPRIM Take 100 mg by mouth every other day.   amLODipine 10 MG tablet Commonly known as: NORVASC Take 10 mg by mouth in the morning. 9am   aspirin EC 81 MG tablet Take 81 mg by mouth daily. Swallow whole.   atorvastatin 40 MG tablet Commonly known as: LIPITOR Take 40 mg by mouth daily. 9pm.   BD Pen Needle Nano 2nd Gen 32G X 4 MM Misc Generic drug: Insulin Pen Needle Use to test blood sugar four times daily. Dx:E11.40   carvedilol 12.5 MG tablet Commonly known as: COREG Take 37.5 mg by mouth in the morning, at noon, and at bedtime. 9am , and 9pm   chlorthalidone 25 MG  tablet Commonly known as: HYGROTON Take 25 mg by mouth daily.   cholecalciferol 25 MCG (1000 UNIT) tablet Commonly known as: VITAMIN D3 Take 1,000 Units by mouth daily.   furosemide 80 MG tablet Commonly known as: LASIX Take 80 mg by mouth as needed for edema or fluid.   gabapentin 100 MG capsule Commonly known as: NEURONTIN Take 100 mg by mouth 2 (two) times daily.   glucose blood test strip 1 each by Other route 3 (three) times daily. Use as instructed   hydrALAZINE 25 MG tablet Commonly known as: APRESOLINE Take 100 mg by mouth in the morning and at bedtime.   insulin lispro 100 UNIT/ML KwikPen Commonly known as: HUMALOG .03-0.17 mLs (3-17 Units total) into the skin 2 (two) times daily as needed for high blood sugar E11.40   Jardiance 10 MG Tabs tablet Generic drug: empagliflozin Take 10 mg by mouth daily.   Lantus SoloStar 100 UNIT/ML Solostar Pen Generic drug: insulin glargine INJECT 35 UNITS SUBCUTANEOUSLY ONCE DAILY BEFORE BREAKFAST   levothyroxine 75 MCG tablet Commonly known as: SYNTHROID Take 75 mcg by mouth daily before breakfast.  7am   mupirocin ointment 2 % Commonly known as: BACTROBAN Apply 1 Application topically 2 (two) times daily. To affected area till better   mycophenolate 180 MG EC tablet Commonly known as: MYFORTIC Take 3 tablets (540 mg total) by mouth in the morning and at bedtime. 3 tablets (540 mg total per dose) at 9am and 9pm   Ozempic (1 MG/DOSE) 4 MG/3ML Sopn Generic drug: Semaglutide (1 MG/DOSE) INJECT 1 MG INTO THE SKIN ONCE A WEEK AT 9AM   saline Gel Place 1 Application into both nostrils every 4 (four) hours.   spironolactone 25 MG tablet Commonly known as: ALDACTONE Take 50 mg by mouth every morning.   tacrolimus ER 1 MG Tb24 Commonly known as: ENVARSUS XR Take 5 mg by mouth daily before breakfast.        Review of Systems  Constitutional:  Negative for appetite change, chills, fatigue, fever and unexpected weight change.  HENT:  Negative for congestion, dental problem, ear discharge, ear pain, facial swelling, hearing loss, nosebleeds, postnasal drip, rhinorrhea, sinus pressure, sinus pain, sneezing, sore throat, tinnitus and trouble swallowing.   Eyes:  Negative for pain, discharge, redness, itching and visual disturbance.  Respiratory:  Negative for cough, chest tightness, shortness of breath and wheezing.   Cardiovascular:  Negative for chest pain, palpitations and leg swelling.  Gastrointestinal:  Negative for abdominal distention, abdominal pain, blood in stool, constipation, diarrhea, nausea and vomiting.  Endocrine: Negative for cold intolerance, heat intolerance, polydipsia, polyphagia and polyuria.  Genitourinary:  Negative for difficulty urinating, dysuria, flank pain, frequency and urgency.  Musculoskeletal:  Negative for arthralgias, back pain, gait problem, joint swelling, myalgias, neck pain and neck stiffness.       Right BKA fathom pain   Skin:  Negative for color change, pallor, rash and wound.  Neurological:  Negative for dizziness, syncope, speech difficulty,  weakness, light-headedness, numbness and headaches.  Hematological:  Does not bruise/bleed easily.  Psychiatric/Behavioral:  Negative for agitation, behavioral problems, confusion, hallucinations, self-injury, sleep disturbance and suicidal ideas. The patient is not nervous/anxious.     Immunization History  Administered Date(s) Administered   Hepatitis B, ADULT 12/30/2014, 01/27/2015, 03/01/2015, 06/30/2015   Influenza Split 07/31/2011, 01/31/2020   Influenza, Seasonal, Injecte, Preservative Fre 12/25/2022   Influenza,inj,Quad PF,6+ Mos 01/19/2020, 12/27/2020, 04/26/2021, 01/16/2022   Influenza-Unspecified 01/19/2020   Moderna Covid-19 Fall Seasonal Vaccine  40yrs & older 04/19/2022   PFIZER(Purple Top)SARS-COV-2 Vaccination 06/11/2019, 07/07/2019, 12/31/2019, 01/31/2020, 08/31/2020, 10/11/2020   PNEUMOCOCCAL CONJUGATE-20 01/16/2022   Pfizer Covid-19 Vaccine Bivalent Booster 110yrs & up 04/26/2021   Pfizer(Comirnaty)Fall Seasonal Vaccine 12 years and older 12/25/2022   Pneumococcal Polysaccharide-23 05/22/2015   Tdap 03/01/2017   Pertinent  Health Maintenance Due  Topic Date Due   FOOT EXAM  Never done   HEMOGLOBIN A1C  06/02/2023   OPHTHALMOLOGY EXAM  07/23/2023   Colonoscopy  11/14/2031   INFLUENZA VACCINE  Completed      07/25/2022   12:57 PM 10/25/2022    8:21 AM 01/09/2023    9:16 AM 03/11/2023    1:41 PM 04/28/2023    9:04 AM  Fall Risk  Falls in the past year? 0 0 0 0 0  Was there an injury with Fall? 0 0  0 0  Fall Risk Category Calculator 0 0  0 0  Patient at Risk for Falls Due to No Fall Risks No Fall Risks     Fall risk Follow up  Falls evaluation completed      Functional Status Survey:    Vitals:   04/28/23 0910  BP: 138/70  Pulse: 83  Resp: 20  Temp: 97.8 F (36.6 C)  SpO2: 96%  Weight: 225 lb 6.4 oz (102.2 kg)  Height: 5\' 8"  (1.727 m)   Body mass index is 34.27 kg/m. Physical Exam Vitals reviewed.  Constitutional:      General: He is not in  acute distress.    Appearance: Normal appearance. He is normal weight. He is not ill-appearing or diaphoretic.  HENT:     Head: Normocephalic.     Right Ear: Tympanic membrane, ear canal and external ear normal. There is no impacted cerumen.     Left Ear: Tympanic membrane, ear canal and external ear normal. There is no impacted cerumen.     Nose: Nose normal. No congestion or rhinorrhea.     Mouth/Throat:     Mouth: Mucous membranes are moist.     Pharynx: Oropharynx is clear. No oropharyngeal exudate or posterior oropharyngeal erythema.  Eyes:     General: No scleral icterus.       Right eye: No discharge.        Left eye: No discharge.     Extraocular Movements: Extraocular movements intact.     Conjunctiva/sclera: Conjunctivae normal.     Pupils: Pupils are equal, round, and reactive to light.  Neck:     Vascular: No carotid bruit.  Cardiovascular:     Rate and Rhythm: Normal rate and regular rhythm.     Heart sounds: Normal heart sounds. No murmur heard.    No friction rub. No gallop.     Comments: Right BKA   Pulmonary:     Effort: Pulmonary effort is normal. No respiratory distress.     Breath sounds: Normal breath sounds. No wheezing, rhonchi or rales.  Chest:     Chest wall: No tenderness.  Abdominal:     General: Bowel sounds are normal. There is no distension.     Palpations: Abdomen is soft. There is no mass.     Tenderness: There is no abdominal tenderness. There is no right CVA tenderness, left CVA tenderness, guarding or rebound.  Musculoskeletal:        General: No swelling or tenderness. Normal range of motion.     Cervical back: Normal range of motion. No rigidity or tenderness.     Left  lower leg: No edema.     Comments: Left great toe amputee   Lymphadenopathy:     Cervical: No cervical adenopathy.  Skin:    General: Skin is warm and dry.     Coloration: Skin is not pale.     Findings: No bruising, erythema, lesion or rash.  Neurological:     Mental  Status: He is alert and oriented to person, place, and time.     Cranial Nerves: No cranial nerve deficit.     Sensory: No sensory deficit.     Motor: No weakness.     Coordination: Coordination normal.     Gait: Gait normal.  Psychiatric:        Mood and Affect: Mood normal.        Speech: Speech normal.        Behavior: Behavior normal.        Thought Content: Thought content normal.        Judgment: Judgment normal.     Labs reviewed: Recent Labs    02/11/23 1000 03/13/23 1000 04/14/23 0950  NA 138 137 138  K 4.9 4.6 4.6  CL 107 108 106  CO2 21* 21* 23  GLUCOSE 147* 160* 157*  BUN 53* 60* 55*  CREATININE 2.56* 2.49* 2.45*  CALCIUM 9.3 9.1 9.3  MG 1.7 1.9 2.2  PHOS 4.3 4.2 4.0   Recent Labs    12/03/22 1338  AST 9*  ALT 8*  BILITOT 0.6  PROT 6.3   Recent Labs    02/11/23 1000 03/13/23 1000 04/14/23 0950  WBC 10.4 9.9 9.5  NEUTROABS 8.7* 8.3* 8.0*  HGB 10.8* 10.8* 10.8*  HCT 37.5* 38.1* 35.8*  MCV 86.6 87.6 84.8  PLT 190 197 183   Lab Results  Component Value Date   TSH 2.56 12/03/2022   Lab Results  Component Value Date   HGBA1C 6.7 (H) 12/03/2022   Lab Results  Component Value Date   CHOL 62 12/03/2022   HDL 25 (L) 12/03/2022   LDLCALC 20 12/03/2022   TRIG 90 12/03/2022   CHOLHDL 2.5 12/03/2022    Significant Diagnostic Results in last 30 days:  No results found.  Assessment/Plan    Renal Artery Stenosis Underwent successful angioplasty with improved blood pressure and overall well-being. Emphasized the importance of regular blood pressure monitoring and nephrology follow-up. - Monitor blood pressure regularly - Follow up with nephrologist next month  Hypertension Blood pressure improved post-angioplasty, with recent readings around 138/70 mmHg. On spironolactone, hydralazine, amlodipine, and carvedilol. Emphasized continued monitoring and medication adherence. - Continue current antihypertensive medications - Monitor blood  pressure regularly - Lipid panel - TSH - COMPLETE METABOLIC PANEL WITH GFR - CBC with Differential/Platelet  Chronic Kidney Disease Single kidney status, on furosemide as needed and spironolactone. Emphasized monitoring kidney function and potassium levels. - Recheck potassium levels - Monitor kidney function  Type 2 Diabetes Mellitus Blood glucose levels fluctuating between 160-200 mg/dL, recent Z6X 0.9%. On Ozempic 1 mg weekly, Humalog per sliding scale, and Lantus 35 units daily. Declined increasing Ozempic due to side effects. Emphasized regular monitoring and current medication regimen. - Continue Ozempic 1 mg weekly - Continue Humalog as per sliding scale - Refill Lantus prescription - Schedule diabetic eye exam - Schedule foot exam - insulin glargine (LANTUS SOLOSTAR) 100 UNIT/ML Solostar Pen; Inject 35 Units into the skin daily. INJECT 35 UNITS SUBCUTANEOUSLY ONCE DAILY BEFORE BREAKFAST  Dispense: 15 mL; Refill: 11 - Hemoglobin A1c  Peripheral Neuropathy Reports  phantom pain and decreased sensation in feet. On gabapentin twice daily. Emphasized regular foot exams to monitor for complications. - Continue gabapentin - Schedule foot exam  Hyperlipidemia Due for cholesterol recheck. On atorvastatin. Emphasized regular lipid monitoring to manage cardiovascular risk. - Recheck cholesterol levels - Lipid panel  Hypothyroidism On levothyroxine 75 mcg daily, taken on an empty stomach. Due for thyroid function test. Emphasized proper medication administration for absorption. - Recheck thyroid function  Epistaxis Attributed to nasal dryness. Prescribed antibacterial ointment and advised humidifier use, reducing frequency. Advised against using Q-tips and tweezers to remove clotted blood. Discussed risks of nasal trauma from improper cleaning. - Continue using prescribed antibacterial ointment - Increase humidifier setting - Avoid using Q-tips and tweezers in the nostrils  Dry  skin dermatitis Mid - abdomen peri-umbilicus area dry rough black hyperpigmented skin   - triamcinolone cream (KENALOG) 0.1 %; Apply 1 Application topically 2 (two) times daily. Apply to affected areas on abdomen  Dispense: 30 g; Refill: 0  5. Anemia associated with chronic renal failure - previous Hgb 10.8 stable  Suspect due to chronic kidney disease  - CBC with Differential/Platelet  General Health Maintenance Up to date on pneumonia and flu vaccines. Due for COVID and shingles vaccines. Expressed disinterest in shingles vaccine due to never having had chickenpox. Emphasized importance of COVID vaccination. - Get COVID vaccine at pharmacy - Discuss shingles vaccine with nephrologist  Follow-up - Schedule diabetic eye exam - Schedule foot exam - Follow up with nephrologist next month - Schedule six-month follow-up appointment - Complete lab work today.     Family/ staff Communication: Reviewed plan of care with patient verbalized understanding   Labs/tests ordered:  - Hemoglobin A1c - Lipid panel - TSH - COMPLETE METABOLIC PANEL WITH GFR - CBC with Differential/Platelet  Next Appointment : Return in about 6 months (around 10/26/2023) for medical mangement of chronic issues.Caesar Bookman, NP

## 2023-04-29 LAB — CBC WITH DIFFERENTIAL/PLATELET
Absolute Lymphocytes: 921 {cells}/uL (ref 850–3900)
Absolute Monocytes: 614 {cells}/uL (ref 200–950)
Basophils Absolute: 37 {cells}/uL (ref 0–200)
Basophils Relative: 0.4 %
Eosinophils Absolute: 93 {cells}/uL (ref 15–500)
Eosinophils Relative: 1 %
HCT: 36.3 % — ABNORMAL LOW (ref 38.5–50.0)
Hemoglobin: 10.7 g/dL — ABNORMAL LOW (ref 13.2–17.1)
MCH: 24.9 pg — ABNORMAL LOW (ref 27.0–33.0)
MCHC: 29.5 g/dL — ABNORMAL LOW (ref 32.0–36.0)
MCV: 84.6 fL (ref 80.0–100.0)
MPV: 10.8 fL (ref 7.5–12.5)
Monocytes Relative: 6.6 %
Neutro Abs: 7635 {cells}/uL (ref 1500–7800)
Neutrophils Relative %: 82.1 %
Platelets: 223 10*3/uL (ref 140–400)
RBC: 4.29 10*6/uL (ref 4.20–5.80)
RDW: 12.7 % (ref 11.0–15.0)
Total Lymphocyte: 9.9 %
WBC: 9.3 10*3/uL (ref 3.8–10.8)

## 2023-04-29 LAB — COMPLETE METABOLIC PANEL WITH GFR
AG Ratio: 1.8 (calc) (ref 1.0–2.5)
ALT: 13 U/L (ref 9–46)
AST: 14 U/L (ref 10–35)
Albumin: 4.2 g/dL (ref 3.6–5.1)
Alkaline phosphatase (APISO): 96 U/L (ref 35–144)
BUN/Creatinine Ratio: 19 (calc) (ref 6–22)
BUN: 43 mg/dL — ABNORMAL HIGH (ref 7–25)
CO2: 23 mmol/L (ref 20–32)
Calcium: 9.5 mg/dL (ref 8.6–10.3)
Chloride: 108 mmol/L (ref 98–110)
Creat: 2.28 mg/dL — ABNORMAL HIGH (ref 0.70–1.30)
Globulin: 2.3 g/dL (ref 1.9–3.7)
Glucose, Bld: 150 mg/dL — ABNORMAL HIGH (ref 65–99)
Potassium: 5.1 mmol/L (ref 3.5–5.3)
Sodium: 139 mmol/L (ref 135–146)
Total Bilirubin: 0.5 mg/dL (ref 0.2–1.2)
Total Protein: 6.5 g/dL (ref 6.1–8.1)
eGFR: 33 mL/min/{1.73_m2} — ABNORMAL LOW (ref >=60)

## 2023-04-29 LAB — LIPID PANEL
Cholesterol: 65 mg/dL (ref <200)
HDL: 26 mg/dL — ABNORMAL LOW (ref >=40)
LDL Cholesterol (Calc): 19 mg/dL
Non-HDL Cholesterol (Calc): 39 mg/dL (ref <130)
Total CHOL/HDL Ratio: 2.5 (calc) (ref <5.0)
Triglycerides: 114 mg/dL (ref <150)

## 2023-04-29 LAB — HEMOGLOBIN A1C
Hgb A1c MFr Bld: 7.2 %{Hb} — ABNORMAL HIGH (ref <5.7)
Mean Plasma Glucose: 160 mg/dL
eAG (mmol/L): 8.9 mmol/L

## 2023-04-29 LAB — TSH: TSH: 2.62 m[IU]/L (ref 0.40–4.50)

## 2023-05-01 MED FILL — LEVOTHYROXINE 75 MCG TABLET: ORAL | 60 days supply | Qty: 60 | Fill #1

## 2023-05-01 MED FILL — CHLORTHALIDONE 25 MG TABLET: ORAL | 30 days supply | Qty: 60 | Fill #4

## 2023-05-03 DIAGNOSIS — G4733 Obstructive sleep apnea (adult) (pediatric): Secondary | ICD-10-CM | POA: Diagnosis not present

## 2023-05-04 DIAGNOSIS — Z940 Kidney transplant status: Principal | ICD-10-CM

## 2023-05-05 DIAGNOSIS — Z940 Kidney transplant status: Principal | ICD-10-CM

## 2023-05-05 MED ORDER — TACROLIMUS XR 1 MG TABLET,EXTENDED RELEASE 24 HR
ORAL_TABLET | Freq: Every day | ORAL | 3 refills | 90.00 days | Status: CP
Start: 2023-05-05 — End: 2024-05-04

## 2023-05-06 DIAGNOSIS — G4733 Obstructive sleep apnea (adult) (pediatric): Secondary | ICD-10-CM | POA: Diagnosis not present

## 2023-05-07 DIAGNOSIS — Z940 Kidney transplant status: Principal | ICD-10-CM

## 2023-05-12 DIAGNOSIS — Z940 Kidney transplant status: Principal | ICD-10-CM

## 2023-05-13 DIAGNOSIS — R8279 Other abnormal findings on microbiological examination of urine: Principal | ICD-10-CM

## 2023-05-13 DIAGNOSIS — R718 Other abnormality of red blood cells: Principal | ICD-10-CM

## 2023-05-13 DIAGNOSIS — Z940 Kidney transplant status: Principal | ICD-10-CM

## 2023-05-13 DIAGNOSIS — R7989 Other specified abnormal findings of blood chemistry: Principal | ICD-10-CM

## 2023-05-14 ENCOUNTER — Ambulatory Visit: Admit: 2023-05-14 | Discharge: 2023-05-14 | Payer: MEDICARE

## 2023-05-14 ENCOUNTER — Inpatient Hospital Stay: Admit: 2023-05-14 | Discharge: 2023-05-14 | Payer: MEDICARE

## 2023-05-14 DIAGNOSIS — Z940 Kidney transplant status: Principal | ICD-10-CM

## 2023-05-14 DIAGNOSIS — R7989 Other specified abnormal findings of blood chemistry: Principal | ICD-10-CM

## 2023-05-14 DIAGNOSIS — R718 Other abnormality of red blood cells: Principal | ICD-10-CM

## 2023-05-14 DIAGNOSIS — Z79899 Other long term (current) drug therapy: Principal | ICD-10-CM

## 2023-05-14 DIAGNOSIS — R8279 Other abnormal findings on microbiological examination of urine: Principal | ICD-10-CM

## 2023-05-14 DIAGNOSIS — N200 Calculus of kidney: Principal | ICD-10-CM

## 2023-05-14 DIAGNOSIS — I701 Atherosclerosis of renal artery: Secondary | ICD-10-CM | POA: Diagnosis not present

## 2023-05-14 DIAGNOSIS — Z7982 Long term (current) use of aspirin: Secondary | ICD-10-CM | POA: Diagnosis not present

## 2023-05-14 DIAGNOSIS — E1140 Type 2 diabetes mellitus with diabetic neuropathy, unspecified: Secondary | ICD-10-CM | POA: Diagnosis not present

## 2023-05-14 DIAGNOSIS — Z7984 Long term (current) use of oral hypoglycemic drugs: Secondary | ICD-10-CM | POA: Diagnosis not present

## 2023-05-14 DIAGNOSIS — Z905 Acquired absence of kidney: Secondary | ICD-10-CM | POA: Diagnosis not present

## 2023-05-14 DIAGNOSIS — I2510 Atherosclerotic heart disease of native coronary artery without angina pectoris: Secondary | ICD-10-CM | POA: Diagnosis not present

## 2023-05-14 DIAGNOSIS — G4733 Obstructive sleep apnea (adult) (pediatric): Secondary | ICD-10-CM | POA: Diagnosis not present

## 2023-05-14 DIAGNOSIS — T8619 Other complication of kidney transplant: Secondary | ICD-10-CM | POA: Diagnosis not present

## 2023-05-14 DIAGNOSIS — Z7985 Long-term (current) use of injectable non-insulin antidiabetic drugs: Secondary | ICD-10-CM | POA: Diagnosis not present

## 2023-05-14 DIAGNOSIS — Z794 Long term (current) use of insulin: Secondary | ICD-10-CM | POA: Diagnosis not present

## 2023-05-14 DIAGNOSIS — I129 Hypertensive chronic kidney disease with stage 1 through stage 4 chronic kidney disease, or unspecified chronic kidney disease: Secondary | ICD-10-CM | POA: Diagnosis not present

## 2023-05-14 MED FILL — GABAPENTIN 100 MG CAPSULE: ORAL | 30 days supply | Qty: 60 | Fill #1

## 2023-05-20 ENCOUNTER — Ambulatory Visit
Admit: 2023-05-20 | Discharge: 2023-05-21 | Payer: MEDICARE | Attending: Student in an Organized Health Care Education/Training Program | Primary: Student in an Organized Health Care Education/Training Program

## 2023-05-20 ENCOUNTER — Ambulatory Visit: Admit: 2023-05-20 | Discharge: 2023-05-21 | Payer: MEDICARE

## 2023-05-21 DIAGNOSIS — Z940 Kidney transplant status: Principal | ICD-10-CM

## 2023-05-21 MED ORDER — ERGOCALCIFEROL (VITAMIN D2) 1,250 MCG (50,000 UNIT) CAPSULE
ORAL_CAPSULE | ORAL | 2 refills | 28.00 days | Status: CP
Start: 2023-05-21 — End: 2023-08-07
  Filled 2023-05-23: qty 4, 28d supply, fill #0

## 2023-05-23 MED FILL — MYCOPHENOLATE SODIUM 180 MG TABLET,DELAYED RELEASE: ORAL | 30 days supply | Qty: 180 | Fill #4

## 2023-05-23 MED FILL — HYDRALAZINE 100 MG TABLET: ORAL | 90 days supply | Qty: 180 | Fill #3

## 2023-05-28 MED FILL — AMLODIPINE 10 MG TABLET: ORAL | 90 days supply | Qty: 90 | Fill #1

## 2023-05-28 MED FILL — CARVEDILOL 12.5 MG TABLET: ORAL | 90 days supply | Qty: 540 | Fill #1

## 2023-05-28 MED FILL — JARDIANCE 25 MG TABLET: ORAL | 90 days supply | Qty: 90 | Fill #1

## 2023-05-28 MED FILL — CHLORTHALIDONE 25 MG TABLET: ORAL | 90 days supply | Qty: 180 | Fill #5

## 2023-05-31 DIAGNOSIS — G4733 Obstructive sleep apnea (adult) (pediatric): Secondary | ICD-10-CM | POA: Diagnosis not present

## 2023-06-01 DIAGNOSIS — Z940 Kidney transplant status: Principal | ICD-10-CM

## 2023-06-03 DIAGNOSIS — G4733 Obstructive sleep apnea (adult) (pediatric): Secondary | ICD-10-CM | POA: Diagnosis not present

## 2023-06-04 MED FILL — ALLOPURINOL 100 MG TABLET: ORAL | 90 days supply | Qty: 45 | Fill #2

## 2023-06-08 ENCOUNTER — Other Ambulatory Visit: Payer: Self-pay | Admitting: Family

## 2023-06-08 DIAGNOSIS — E1140 Type 2 diabetes mellitus with diabetic neuropathy, unspecified: Secondary | ICD-10-CM

## 2023-06-09 ENCOUNTER — Telehealth (INDEPENDENT_AMBULATORY_CARE_PROVIDER_SITE_OTHER): Payer: Self-pay | Admitting: Otolaryngology

## 2023-06-09 ENCOUNTER — Other Ambulatory Visit (HOSPITAL_COMMUNITY)
Admission: RE | Admit: 2023-06-09 | Discharge: 2023-06-09 | Disposition: A | Discharge status: Home / Self Care | Source: Ambulatory Visit | Attending: Pediatric Nephrology | Admitting: Pediatric Nephrology

## 2023-06-09 ENCOUNTER — Inpatient Hospital Stay (HOSPITAL_COMMUNITY): Admit: 2023-06-09

## 2023-06-09 DIAGNOSIS — D899 Disorder involving the immune mechanism, unspecified: Secondary | ICD-10-CM | POA: Diagnosis not present

## 2023-06-09 LAB — CBC WITH DIFFERENTIAL/PLATELET
Abs Immature Granulocytes: 0.07 10*3/uL (ref 0.00–0.07)
Basophils Absolute: 0 10*3/uL (ref 0.0–0.1)
Basophils Relative: 0 %
Eosinophils Absolute: 0.1 10*3/uL (ref 0.0–0.5)
Eosinophils Relative: 1 %
HCT: 38.7 % — ABNORMAL LOW (ref 39.0–52.0)
Hemoglobin: 11 g/dL — ABNORMAL LOW (ref 13.0–17.0)
Immature Granulocytes: 1 %
Lymphocytes Relative: 9 %
Lymphs Abs: 0.8 10*3/uL (ref 0.7–4.0)
MCH: 24.7 pg — ABNORMAL LOW (ref 26.0–34.0)
MCHC: 28.4 g/dL — ABNORMAL LOW (ref 30.0–36.0)
MCV: 87 fL (ref 80.0–100.0)
Monocytes Absolute: 0.9 10*3/uL (ref 0.1–1.0)
Monocytes Relative: 11 %
Neutro Abs: 6.5 10*3/uL (ref 1.7–7.7)
Neutrophils Relative %: 78 %
Platelets: 189 10*3/uL (ref 150–400)
RBC: 4.45 MIL/uL (ref 4.22–5.81)
RDW: 14.4 % (ref 11.5–15.5)
WBC: 8.4 10*3/uL (ref 4.0–10.5)
nRBC: 0 % (ref 0.0–0.2)

## 2023-06-09 LAB — BASIC METABOLIC PANEL
Anion gap: 10 (ref 5–15)
BUN: 53 mg/dL — ABNORMAL HIGH (ref 6–20)
CO2: 22 mmol/L (ref 22–32)
Calcium: 8.9 mg/dL (ref 8.9–10.3)
Chloride: 108 mmol/L (ref 98–111)
Creatinine, Ser: 2.2 mg/dL — ABNORMAL HIGH (ref 0.61–1.24)
GFR, Estimated: 34 mL/min — ABNORMAL LOW (ref >60)
Glucose, Bld: 146 mg/dL — ABNORMAL HIGH (ref 70–99)
Potassium: 4.6 mmol/L (ref 3.5–5.1)
Sodium: 140 mmol/L (ref 135–145)

## 2023-06-09 LAB — URIC ACID: Uric Acid, Serum: 7.8 mg/dL (ref 3.7–8.6)

## 2023-06-09 LAB — PHOSPHORUS: Phosphorus: 3.2 mg/dL (ref 2.5–4.6)

## 2023-06-09 LAB — MAGNESIUM: Magnesium: 1.9 mg/dL (ref 1.7–2.4)

## 2023-06-09 NOTE — Telephone Encounter (Signed)
 LVM to confirm appt & location [phone] [id number]

## 2023-06-10 ENCOUNTER — Encounter (INDEPENDENT_AMBULATORY_CARE_PROVIDER_SITE_OTHER): Payer: Self-pay | Admitting: Otolaryngology

## 2023-06-10 ENCOUNTER — Ambulatory Visit (INDEPENDENT_AMBULATORY_CARE_PROVIDER_SITE_OTHER): Payer: 59 | Admitting: Otolaryngology

## 2023-06-10 VITALS — BP 153/88 | HR 88

## 2023-06-10 DIAGNOSIS — R0981 Nasal congestion: Secondary | ICD-10-CM

## 2023-06-10 DIAGNOSIS — R040 Epistaxis: Secondary | ICD-10-CM | POA: Diagnosis not present

## 2023-06-10 LAB — TACROLIMUS LEVEL: Tacrolimus (FK506) - LabCorp: 6.1 ng/mL (ref 5.0–20.0)

## 2023-06-10 MED FILL — GABAPENTIN 100 MG CAPSULE: ORAL | 30 days supply | Qty: 60 | Fill #2

## 2023-06-10 NOTE — Progress Notes (Signed)
 Dear Dr. Elam Dutch, Here is my assessment for our mutual patient, Tim Becker. Thank you for allowing me the opportunity to care for your patient. Please do not hesitate to contact me should you have any other questions. Sincerely, Dr. Jovita Kussmaul  Otolaryngology Clinic Note Referring provider: Dr. Elam Dutch HPI:  Tim Becker is a 58 y.o. male kindly referred by Dr. Elam Dutch for evaluation of epistaxis.  Initial visit (04/2023): Epistaxis history: started last year, saw [facility], noted dry nose -- Rx mupirocin; he reports it stopped for a little bit, then started again; He does use a Nasal CPAP. He does think it is getting more frequent HTN: yes Frequency: daily - does not really drip but will blow out clots Side: both but started on right and now on left CKD/Liver dysfunction: CKD/Kidney Dysfunction Anticoagulation/AP: ASA 81 Trauma: no History of Sinusitis: no Nasal obstruction: no Nasal procedures: no Current nasal medication use: saline spray; no irrigation, no flonase No history of frequent sinusitis  Personal or FHx of bleeding dz or anesthesia difficulty: no  --------------------------------------------------------- 06/10/2023 Still bleeding, not as much however. He reports that he does blow out some clots still when he blows his nose, primarily on left. Had 1-2 brisk bleeds; Has been using mupirocin but stopped, now on ayr gel and saline spray. No sinus issues. Using nasal CPAP  -------------------------------------------------------- GLP-1: Ozempic AP/AC: ASA 81  PMHx: HTN, CAD, CKD on HD s/p renal transplant, Anemia, T2DM  Tobacco: no.   Independent Review of Additional Tests or Records:  Dr. Lilla Shook (03/11/2023): noted blood clots from nose every time he wakes up; uses CPAP; very dry nose; used sprays without relief Aquilla Hacker (05/17/2021) - ENT; clear mucus, blowing nose; mild epistaxis b/l; using CPAP; no nasal sprays; noted dry nasal mucosa; Rx: mupirocin and  humidify CPAP; will check CT 05/31/2021 CT Sinus: No significant sinonasal opacification CT Neck 10/30/2017 independently reviewed and interpreted with attention to nasal cavity: no sinus opacification; limited eval CBC and BMP (04/16/2023 and 04/14/2023): Plt 183, Hgb 10.8, WBC 9.5, Eos 100; BMP: Cr 2.45, BUN 55 PMH/Meds/All/SocHx/FamHx/ROS:   Past Medical History:  Diagnosis Date   Anemia    Arthritis    Cancer (HCC)    Renal Tumor   CKD (chronic kidney disease)    Coronary artery disease    Diabetes (HCC)    DM (diabetes mellitus) with complications (HCC)    ESRD (end stage renal disease) (HCC)    Pre- dialysis   History of colonoscopy 2011   Hypertension    Kidney transplanted 2022   Left kidney mass    Neuropathy    Obesity    Osteomyelitis, chronic, ankle or foot (HCC)    PONV (postoperative nausea and vomiting)    Renal insufficiency    Patient is on Dialysis and receives M,W and F.   S/P BKA (below knee amputation) (HCC) 08/2014   Right , [facility], [place]   Sleep apnea    CPAP   Thyroid disease      Past Surgical History:  Procedure Laterality Date   BASCILIC VEIN TRANSPOSITION Left 02/17/2015   Procedure: BASCILIC VEIN TRANSPOSITION;  Surgeon: Renford Dills, MD;  Location: [facility];  Service: Vascular;  Laterality: Left;   BELOW KNEE LEG AMPUTATION Right    CORONARY ANGIOPLASTY WITH STENT PLACEMENT     EYE SURGERY     FOOT SURGERY     Multiple R foot surgery for infection and charcot foot   I & D EXTREMITY  Left 04/16/2016   Procedure: IRRIGATION AND DEBRIDEMENT EXTREMITY/GREAT TOE AMP.;  Surgeon: Felecia Shelling, DPM;  Location: [facility];  Service: Podiatry;  Laterality: Left;   IR ANGIOGRAM FOLLOW UP STUDY  2025   JOINT REPLACEMENT     LAPAROSCOPIC NEPHRECTOMY, HAND ASSISTED Left 07/11/2015   Procedure: HAND ASSISTED LAPAROSCOPIC NEPHRECTOMY;  Surgeon: Vanna Scotland, MD;  Location: [facility];  Service: Urology;  Laterality: Left;   PERIPHERAL VASCULAR  CATHETERIZATION Left 12/06/2014   Procedure: A/V Shuntogram/Fistulagram;  Surgeon: Renford Dills, MD;  Location: [facility];  Service: Cardiovascular;  Laterality: Left;   PERIPHERAL VASCULAR CATHETERIZATION Left 12/06/2014   Procedure: A/V Shunt Intervention;  Surgeon: Renford Dills, MD;  Location: [facility];  Service: Cardiovascular;  Laterality: Left;   TONSILLECTOMY      Family History  Problem Relation Age of Onset   Diabetes Mother    Kidney disease Mother    Breast cancer Mother      Social Connections: Not on file      Current Outpatient Medications:    Accu-Chek Softclix Lancets lancets, Test blood sugar 3 times a day. DX: E11.40, Disp: 100 each, Rfl: 3   acetaminophen (TYLENOL) 500 MG tablet, Take 500 mg by mouth every 6 (six) hours as needed for moderate pain or mild pain., Disp: , Rfl:    allopurinol (ZYLOPRIM) 100 MG tablet, Take 100 mg by mouth every other day., Disp: , Rfl:    amLODipine (NORVASC) 10 MG tablet, Take 10 mg by mouth in the morning. 9am, Disp: , Rfl:    atorvastatin (LIPITOR) 40 MG tablet, Take 40 mg by mouth daily. 9pm., Disp: , Rfl:    Blood Glucose Monitoring Suppl (ACCU-CHEK AVIVA PLUS) w/Device KIT, Use to test blood sugar three times daily. Dx: E11.40, Disp: 1 kit, Rfl: 0   carvedilol (COREG) 12.5 MG tablet, Take 37.5 mg by mouth in the morning, at noon, and at bedtime. 9am , and 9pm, Disp: , Rfl:    chlorthalidone (HYGROTON) 25 MG tablet, Take 25 mg by mouth daily., Disp: , Rfl:    cholecalciferol (VITAMIN D3) 25 MCG (1000 UNIT) tablet, Take 1,000 Units by mouth daily., Disp: , Rfl:    empagliflozin (JARDIANCE) 10 MG TABS tablet, Take 10 mg by mouth daily., Disp: , Rfl:    furosemide (LASIX) 80 MG tablet, Take 80 mg by mouth as needed for edema or fluid., Disp: , Rfl:    gabapentin (NEURONTIN) 100 MG capsule, Take 100 mg by mouth 2 (two) times daily., Disp: , Rfl:    glucose blood test strip, 1 each by Other route 3  (three) times daily. Use as instructed, Disp: 100 each, Rfl: 12   hydrALAZINE (APRESOLINE) 25 MG tablet, Take 100 mg by mouth in the morning and at bedtime., Disp: , Rfl:    insulin glargine (LANTUS SOLOSTAR) 100 UNIT/ML Solostar Pen, Inject 35 Units into the skin daily. INJECT 35 UNITS SUBCUTANEOUSLY ONCE DAILY BEFORE BREAKFAST, Disp: 15 mL, Rfl: 11   insulin lispro (HUMALOG) 100 UNIT/ML KwikPen, INJECT 3-17 UNITS INTO THE SKIN 2 TIMES PER DAY AS NEEDED FOR HIGH BLOOD SUGAR, Disp: 15 mL, Rfl: 1   Insulin Pen Needle (BD PEN NEEDLE NANO 2ND GEN) 32G X 4 MM MISC, Use to test blood sugar four times daily. Dx:E11.40, Disp: 400 each, Rfl: 5   levothyroxine (SYNTHROID) 75 MCG tablet, Take 75 mcg by mouth daily before breakfast. 7am, Disp: , Rfl:    mupirocin ointment (  BACTROBAN) 2 %, Apply 1 Application topically 2 (two) times daily. To affected area till better, Disp: 22 g, Rfl: 0   mycophenolate (MYFORTIC) 180 MG EC tablet, Take 3 tablets (540 mg total) by mouth in the morning and at bedtime. 3 tablets (540 mg total per dose) at 9am and 9pm, Disp: , Rfl:    saline (AYR) GEL, Place 1 Application into both nostrils every 4 (four) hours., Disp: 14 g, Rfl: 5   Semaglutide, 1 MG/DOSE, (OZEMPIC, 1 MG/DOSE,) 4 MG/3ML SOPN, INJECT 1 MG INTO THE SKIN ONCE A WEEK AT 9AM, Disp: 3 mL, Rfl: 11   spironolactone (ALDACTONE) 25 MG tablet, Take 50 mg by mouth every morning., Disp: , Rfl:    tacrolimus ER (ENVARSUS XR) 1 MG TB24, Take 5 mg by mouth daily before breakfast., Disp: , Rfl:    triamcinolone cream (KENALOG) 0.1 %, Apply 1 Application topically 2 (two) times daily. Apply to affected areas on abdomen, Disp: 30 g, Rfl: 0   aspirin EC 81 MG tablet, Take 81 mg by mouth daily. Swallow whole. (Patient not taking: Reported on 04/28/2023), Disp: , Rfl:    Physical Exam:   BP (!) 153/88 (BP Location: Right Arm, Patient Position: Sitting, Cuff Size: Large)   Pulse 88   SpO2 96%   Salient findings:  CN II-XII intact   Anterior rhinoscopy: Septum dev left, bilateral modest bloody crusting and bilateral ITH; nasal cavity still seems dry but some improved; bilateral inferior turbinates without significant hypertrophy; Nasal endoscopy was indicated to better evaluate the nose and paranasal sinuses, given the patient's history and exam findings, and is detailed below. No lesions of oral cavity/oropharynx  Seprately Identifiable Procedures:  PROCEDURE: Bilateral Rigid Nasal Endoscopy with endoscopic control of LEFT sided epistaxis (CPT [phone]) Pre-procedure diagnosis: Left Epistaxis Post-procedure diagnosis: same Indication: See pre-procedure diagnosis and physical exam above Complications: None apparent EBL: minimal mL Anesthesia: Lidocaine 4% and topical decongestant was topically sprayed in each nasal cavity  Description of Procedure:  Patient was identified as correct patient. Verbal consent obtained. Afrin/lidocaine mix was sprayed into the nose in both nasal cavities. Subsequently, a headlight and speculum were used and vessels were noted over left septum but I was unable to determine the posterior-most extent of vessels using the headlight/speculum. Therefore, nasal endoscopy was necessary to evaluate and control the epistaxis.  A rigid 0 degree endoscope was utilized to evaluate the sinonasal cavities, mucosa, sinus ostia and turbinates and septum bilaterally. On the left, there were prominent septal vessels but otherwise no bleeding was noted. On the contralateral side, there was a fair amount of crusting with mild prominent septal vessels but was not bleeding. We discussed options including humidification, v/s cautery and R/B/A and patient/caregiver opted for cauterization. Given lack of complete visualization of the posterior portion of the vessel extent, decision was performed to perform cauterization after consent. Using the endoscope, the areas of bleeding/prominent vessels on left were visualized and then  spot cauterized using silver nitrate cautery.  No bleeding was seen. Mupirocin ointment was applied to both nares. Patient tolerated the procedure well.   Impression & Plans:  Tim Becker is a 58 y.o. male with CKD now with:  1. Epistaxis   2. Nasal congestion    He is continuing to have some bloody crusting and drainage, though some improved but still bothering him quite a bit; Endo reassuring overall and seems likely due to dry nasal cavity and left prominent septal vessels and CPAP use certainly does  not help. Given failed humidification, we did do cauterization today on left  - Restart mupirocin 2% ointment both nares BID x7d - q4h AYR Gel especially at night - Also can use ocean spray multiple times per day x6 weeks - Continue increased CPAP humidification - can restart CPAP this Friday; no blowing nose x3 days - fu in 8 weeks; can consider touch up on left or cauterization right if still having issues or right side becomes bothersome    Thank you for allowing me the opportunity to care for your patient. Please do not hesitate to contact me should you have any other questions.  Sincerely, Jovita Kussmaul, MD Otolaryngologist (ENT), [facility] Phone: [phone] Fax: [phone]  06/10/2023, 3:50 PM   MDM:  Level 3 - 99213 Complexity/Problems addressed: low - chronic problem, persistent Data complexity: low - independent review and interpretation of imaging; review of notes, labs - Morbidity: mod  - Prescription Drug prescribed or managed: yes

## 2023-06-23 MED FILL — ERGOCALCIFEROL (VITAMIN D2) 1,250 MCG (50,000 UNIT) CAPSULE: ORAL | 28 days supply | Qty: 4 | Fill #1

## 2023-06-23 MED FILL — MYCOPHENOLATE SODIUM 180 MG TABLET,DELAYED RELEASE: ORAL | 30 days supply | Qty: 180 | Fill #5

## 2023-07-01 DIAGNOSIS — G4733 Obstructive sleep apnea (adult) (pediatric): Secondary | ICD-10-CM | POA: Diagnosis not present

## 2023-07-09 DIAGNOSIS — Z940 Kidney transplant status: Principal | ICD-10-CM

## 2023-07-14 ENCOUNTER — Other Ambulatory Visit (HOSPITAL_COMMUNITY)
Admission: RE | Admit: 2023-07-14 | Discharge: 2023-07-14 | Disposition: A | Discharge status: Home / Self Care | Source: Ambulatory Visit | Attending: Pediatric Nephrology | Admitting: Pediatric Nephrology

## 2023-07-14 DIAGNOSIS — Z09 Encounter for follow-up examination after completed treatment for conditions other than malignant neoplasm: Secondary | ICD-10-CM | POA: Insufficient documentation

## 2023-07-14 DIAGNOSIS — E1129 Type 2 diabetes mellitus with other diabetic kidney complication: Secondary | ICD-10-CM | POA: Diagnosis not present

## 2023-07-14 DIAGNOSIS — Z79899 Other long term (current) drug therapy: Secondary | ICD-10-CM | POA: Insufficient documentation

## 2023-07-14 DIAGNOSIS — Z940 Kidney transplant status: Secondary | ICD-10-CM | POA: Insufficient documentation

## 2023-07-14 DIAGNOSIS — Z114 Encounter for screening for human immunodeficiency virus [HIV]: Secondary | ICD-10-CM | POA: Diagnosis not present

## 2023-07-14 DIAGNOSIS — Z789 Other specified health status: Secondary | ICD-10-CM | POA: Insufficient documentation

## 2023-07-14 DIAGNOSIS — B9689 Other specified bacterial agents as the cause of diseases classified elsewhere: Secondary | ICD-10-CM | POA: Diagnosis not present

## 2023-07-14 DIAGNOSIS — D899 Disorder involving the immune mechanism, unspecified: Secondary | ICD-10-CM | POA: Insufficient documentation

## 2023-07-14 DIAGNOSIS — N390 Urinary tract infection, site not specified: Secondary | ICD-10-CM | POA: Diagnosis not present

## 2023-07-14 DIAGNOSIS — Z9483 Pancreas transplant status: Secondary | ICD-10-CM | POA: Insufficient documentation

## 2023-07-14 DIAGNOSIS — D631 Anemia in chronic kidney disease: Secondary | ICD-10-CM | POA: Diagnosis not present

## 2023-07-14 DIAGNOSIS — E559 Vitamin D deficiency, unspecified: Secondary | ICD-10-CM | POA: Insufficient documentation

## 2023-07-14 DIAGNOSIS — N189 Chronic kidney disease, unspecified: Secondary | ICD-10-CM | POA: Insufficient documentation

## 2023-07-14 DIAGNOSIS — D8940 Mast cell activation, unspecified: Secondary | ICD-10-CM | POA: Diagnosis not present

## 2023-07-14 DIAGNOSIS — B259 Cytomegaloviral disease, unspecified: Secondary | ICD-10-CM | POA: Diagnosis not present

## 2023-07-14 LAB — PHOSPHORUS: Phosphorus: 4.1 mg/dL (ref 2.5–4.6)

## 2023-07-14 LAB — BASIC METABOLIC PANEL WITH GFR
Anion gap: 8 (ref 5–15)
BUN: 54 mg/dL — ABNORMAL HIGH (ref 6–20)
CO2: 22 mmol/L (ref 22–32)
Calcium: 9.5 mg/dL (ref 8.9–10.3)
Chloride: 109 mmol/L (ref 98–111)
Creatinine, Ser: 2.85 mg/dL — ABNORMAL HIGH (ref 0.61–1.24)
GFR, Estimated: 25 mL/min — ABNORMAL LOW (ref >60)
Glucose, Bld: 142 mg/dL — ABNORMAL HIGH (ref 70–99)
Potassium: 4.7 mmol/L (ref 3.5–5.1)
Sodium: 139 mmol/L (ref 135–145)

## 2023-07-14 LAB — URINALYSIS, COMPLETE (UACMP) WITH MICROSCOPIC
Bacteria, UA: NONE SEEN
Bilirubin Urine: NEGATIVE
Glucose, UA: >=500 mg/dL — AB
Hgb urine dipstick: NEGATIVE
Ketones, ur: NEGATIVE mg/dL
Leukocytes,Ua: NEGATIVE
Nitrite: NEGATIVE
Protein, ur: NEGATIVE mg/dL
Specific Gravity, Urine: 1.014 (ref 1.005–1.030)
pH: 5 (ref 5.0–8.0)

## 2023-07-14 LAB — CBC WITH DIFFERENTIAL/PLATELET
Abs Immature Granulocytes: 0.08 10*3/uL — ABNORMAL HIGH (ref 0.00–0.07)
Basophils Absolute: 0 10*3/uL (ref 0.0–0.1)
Basophils Relative: 0 %
Eosinophils Absolute: 0.1 10*3/uL (ref 0.0–0.5)
Eosinophils Relative: 1 %
HCT: 36.2 % — ABNORMAL LOW (ref 39.0–52.0)
Hemoglobin: 10.3 g/dL — ABNORMAL LOW (ref 13.0–17.0)
Immature Granulocytes: 1 %
Lymphocytes Relative: 10 %
Lymphs Abs: 0.7 10*3/uL (ref 0.7–4.0)
MCH: 24.8 pg — ABNORMAL LOW (ref 26.0–34.0)
MCHC: 28.5 g/dL — ABNORMAL LOW (ref 30.0–36.0)
MCV: 87.2 fL (ref 80.0–100.0)
Monocytes Absolute: 0.6 10*3/uL (ref 0.1–1.0)
Monocytes Relative: 8 %
Neutro Abs: 6.1 10*3/uL (ref 1.7–7.7)
Neutrophils Relative %: 80 %
Platelets: 186 10*3/uL (ref 150–400)
RBC: 4.15 MIL/uL — ABNORMAL LOW (ref 4.22–5.81)
RDW: 13.9 % (ref 11.5–15.5)
WBC: 7.6 10*3/uL (ref 4.0–10.5)
nRBC: 0 % (ref 0.0–0.2)

## 2023-07-14 LAB — URIC ACID: Uric Acid, Serum: 8.3 mg/dL (ref 3.7–8.6)

## 2023-07-14 LAB — MAGNESIUM: Magnesium: 1.8 mg/dL (ref 1.7–2.4)

## 2023-07-15 DIAGNOSIS — Z940 Kidney transplant status: Principal | ICD-10-CM

## 2023-07-15 LAB — URINE CULTURE: Culture: NO GROWTH

## 2023-07-16 LAB — TACROLIMUS LEVEL: Tacrolimus (FK506) - LabCorp: 4.5 ng/mL — ABNORMAL LOW (ref 5.0–20.0)

## 2023-07-16 MED FILL — LEVOTHYROXINE 75 MCG TABLET: ORAL | 90 days supply | Qty: 90 | Fill #2

## 2023-07-16 MED FILL — SPIRONOLACTONE 25 MG TABLET: ORAL | 90 days supply | Qty: 180 | Fill #1

## 2023-07-16 MED FILL — MYCOPHENOLATE SODIUM 180 MG TABLET,DELAYED RELEASE: ORAL | 30 days supply | Qty: 180 | Fill #6

## 2023-07-16 MED FILL — ERGOCALCIFEROL (VITAMIN D2) 1,250 MCG (50,000 UNIT) CAPSULE: ORAL | 28 days supply | Qty: 4 | Fill #2

## 2023-07-16 MED FILL — GABAPENTIN 100 MG CAPSULE: ORAL | 30 days supply | Qty: 60 | Fill #3

## 2023-07-17 DIAGNOSIS — Z940 Kidney transplant status: Principal | ICD-10-CM

## 2023-07-17 DIAGNOSIS — G4733 Obstructive sleep apnea (adult) (pediatric): Secondary | ICD-10-CM | POA: Diagnosis not present

## 2023-07-18 DIAGNOSIS — Z940 Kidney transplant status: Principal | ICD-10-CM

## 2023-07-25 DIAGNOSIS — Z940 Kidney transplant status: Principal | ICD-10-CM

## 2023-07-25 MED ORDER — FUROSEMIDE 80 MG TABLET
ORAL_TABLET | ORAL | 11 refills | 0.00000 days
Start: 2023-07-25 — End: 2024-07-19

## 2023-07-29 ENCOUNTER — Ambulatory Visit (INDEPENDENT_AMBULATORY_CARE_PROVIDER_SITE_OTHER): Payer: 59 | Admitting: Family

## 2023-07-29 ENCOUNTER — Encounter: Payer: Self-pay | Admitting: Family

## 2023-07-29 DIAGNOSIS — Z0000 Encounter for general adult medical examination without abnormal findings: Secondary | ICD-10-CM

## 2023-07-29 NOTE — Progress Notes (Signed)
 This service is provided via telemedicine  No vital signs collected/recorded due to the encounter was a telemedicine visit.   Location of patient (ex: home, work):  Home  Patient consents to a telephone visit: Yes  Location of the provider (ex: office, home):  [facility], Office   Name of any referring provider:  N/A  Names of all persons participating in the telemedicine service and their role in the encounter:  Presley Broker, CMA, Patient, and   Time spent on call:  with medical assistant  Soriya Worster, Elijio Guadeloupe, NP    Subjective:   Tim Becker is a 58 y.o. male who presents for Medicare Annual/Subsequent preventive examination.  Visit Complete: Virtual I connected with  Tim Becker on 07/29/23 by a video and audio enabled telemedicine application and verified that I am speaking with the correct person using two identifiers.  Patient Location: Home  Provider Location: Office/Clinic  I discussed the limitations of evaluation and management by telemedicine. The patient expressed understanding and agreed to proceed.  Vital Signs: Because this visit was a virtual/telehealth visit, some criteria may be missing or patient reported. Any vitals not documented were not able to be obtained and vitals that have been documented are patient reported.  Patient Medicare AWV questionnaire was completed by the patient on 07/29/2023 ; I have confirmed that all information answered by patient is correct and no changes since this date.  Cardiac Risk Factors include: advanced age (>28men, >34 women);diabetes mellitus;hypertension;male gender;obesity (BMI >30kg/m2);smoking/ tobacco exposure;sedentary lifestyle     Objective:    Today's Vitals   07/29/23 0956  PainSc: 3    There is no height or weight on file to calculate BMI.     07/29/2023    9:46 AM 04/28/2023    9:05 AM 03/11/2023    1:45 PM 01/09/2023    9:16 AM 07/25/2022   12:57 PM 04/26/2022    10:22 AM 10/24/2021   10:01 AM  Advanced Directives  Does Patient Have a Medical Advance Directive? Yes Yes Yes Yes No No No  Type of Advance Directive Living will Out of facility DNR (pink MOST or yellow form) Out of facility DNR (pink MOST or yellow form) Living will     Does patient want to make changes to medical advance directive? No - Patient declined No - Patient declined No - Patient declined Yes (Inpatient - patient defers changing a medical advance directive at this time - Information given)  No - Patient declined   Would patient like information on creating a medical advance directive?     No - Patient declined  No - Patient declined  Pre-existing out of facility DNR order (yellow form or pink MOST form)  Yellow form placed in chart (order not valid for inpatient use) Yellow form placed in chart (order not valid for inpatient use)        Current Medications (verified) Outpatient Encounter Medications as of 07/29/2023  Medication Sig   acetaminophen  (TYLENOL ) 500 MG tablet Take 500 mg by mouth every 6 (six) hours as needed for moderate pain or mild pain.   allopurinol  (ZYLOPRIM ) 100 MG tablet Take 100 mg by mouth every other day.   amLODipine  (NORVASC ) 10 MG tablet Take 10 mg by mouth in the morning. 9am   atorvastatin  (LIPITOR) 40 MG tablet Take 40 mg by mouth daily. 9pm.   Blood Glucose Monitoring Suppl (ONE TOUCH ULTRA 2) w/Device KIT by Does not apply route.  carvedilol  (COREG ) 12.5 MG tablet Take 37.5 mg by mouth in the morning, at noon, and at bedtime. 9am , and 9pm   chlorthalidone (HYGROTON) 25 MG tablet Take 25 mg by mouth daily.   empagliflozin (JARDIANCE) 10 MG TABS tablet Take 10 mg by mouth daily.   ergocalciferol (VITAMIN D2) 1.25 MG (50000 UT) capsule Take 50,000 Units by mouth once a week.   furosemide (LASIX) 80 MG tablet Take 80 mg by mouth as needed for edema or fluid.   gabapentin (NEURONTIN) 100 MG capsule Take 100 mg by mouth 2 (two) times daily.   glucose blood  (ONETOUCH ULTRA BLUE TEST) test strip 1 each by Other route as needed for other. Use as instructed   hydrALAZINE  (APRESOLINE ) 25 MG tablet Take 100 mg by mouth in the morning and at bedtime.   insulin  glargine (LANTUS  SOLOSTAR) 100 UNIT/ML Solostar Pen Inject 35 Units into the skin daily. INJECT 35 UNITS SUBCUTANEOUSLY ONCE DAILY BEFORE BREAKFAST   insulin  lispro (HUMALOG ) 100 UNIT/ML KwikPen INJECT 3-17 UNITS INTO THE SKIN 2 TIMES PER DAY AS NEEDED FOR HIGH BLOOD SUGAR   Insulin  Pen Needle (BD PEN NEEDLE NANO 2ND GEN) 32G X 4 MM MISC Use to test blood sugar four times daily. Dx:E11.40   levothyroxine  (SYNTHROID ) 75 MCG tablet Take 75 mcg by mouth daily before breakfast. 7am   mupirocin  ointment (BACTROBAN ) 2 % Apply 1 Application topically 2 (two) times daily. To affected area till better   mycophenolate  (MYFORTIC ) 180 MG EC tablet Take 3 tablets (540 mg total) by mouth in the morning and at bedtime. 3 tablets (540 mg total per dose) at 9am and 9pm   saline (AYR) GEL Place 1 Application into both nostrils every 4 (four) hours.   Semaglutide , 1 MG/DOSE, (OZEMPIC , 1 MG/DOSE,) 4 MG/3ML SOPN INJECT 1 MG INTO THE SKIN ONCE A WEEK AT 9AM   spironolactone (ALDACTONE) 25 MG tablet Take 50 mg by mouth every morning.   tacrolimus  ER (ENVARSUS  XR) 1 MG TB24 Take 5 mg by mouth daily before breakfast.   triamcinolone  cream (KENALOG ) 0.1 % Apply 1 Application topically 2 (two) times daily. Apply to affected areas on abdomen   Accu-Chek Softclix Lancets lancets Test blood sugar 3 times a day. DX: E11.40 (Patient not taking: Reported on 07/29/2023)   aspirin  EC 81 MG tablet Take 81 mg by mouth daily. Swallow whole. (Patient not taking: Reported on 04/28/2023)   Blood Glucose Monitoring Suppl (ACCU-CHEK AVIVA PLUS) w/Device KIT Use to test blood sugar three times daily. Dx: E11.40 (Patient not taking: Reported on 07/29/2023)   cholecalciferol (VITAMIN D3) 25 MCG (1000 UNIT) tablet Take 1,000 Units by mouth daily.  (Patient not taking: Reported on 07/29/2023)   glucose blood test strip 1 each by Other route 3 (three) times daily. Use as instructed (Patient not taking: Reported on 07/29/2023)   No facility-administered encounter medications on file as of 07/29/2023.    Allergies (verified) Patient has no known allergies.   History: Past Medical History:  Diagnosis Date   Anemia    Arthritis    Cancer (HCC)    Renal Tumor   CKD (chronic kidney disease)    Coronary artery disease    Diabetes (HCC)    DM (diabetes mellitus) with complications (HCC)    ESRD (end stage renal disease) (HCC)    Pre- dialysis   History of colonoscopy 2011   Hypertension    Kidney transplanted 2022   Left kidney mass  Neuropathy    Obesity    Osteomyelitis, chronic, ankle or foot (HCC)    PONV (postoperative nausea and vomiting)    Renal insufficiency    Patient is on Dialysis and receives M,W and F.   S/P BKA (below knee amputation) (HCC) 08/2014   Right , Jayson Michael, [place]   Sleep apnea    CPAP   Thyroid  disease    Past Surgical History:  Procedure Laterality Date   BASCILIC VEIN TRANSPOSITION Left 02/17/2015   Procedure: BASCILIC VEIN TRANSPOSITION;  Surgeon: Jackquelyn Mass, MD;  Location: [facility] ORS;  Service: Vascular;  Laterality: Left;   BELOW KNEE LEG AMPUTATION Right    CORONARY ANGIOPLASTY WITH STENT PLACEMENT     EYE SURGERY     FOOT SURGERY     Multiple R foot surgery for infection and charcot foot   I & D EXTREMITY Left 04/16/2016   Procedure: IRRIGATION AND DEBRIDEMENT EXTREMITY/GREAT TOE AMP.;  Surgeon: Dot Gazella, DPM;  Location: [facility] OR;  Service: Podiatry;  Laterality: Left;   IR ANGIOGRAM FOLLOW UP STUDY  2025   JOINT REPLACEMENT     LAPAROSCOPIC NEPHRECTOMY, HAND ASSISTED Left 07/11/2015   Procedure: HAND ASSISTED LAPAROSCOPIC NEPHRECTOMY;  Surgeon: Dustin Gimenez, MD;  Location: [facility] ORS;  Service: Urology;  Laterality: Left;   PERIPHERAL VASCULAR CATHETERIZATION Left 12/06/2014    Procedure: A/V Shuntogram/Fistulagram;  Surgeon: Jackquelyn Mass, MD;  Location: [facility] INVASIVE CV LAB;  Service: Cardiovascular;  Laterality: Left;   PERIPHERAL VASCULAR CATHETERIZATION Left 12/06/2014   Procedure: A/V Shunt Intervention;  Surgeon: Jackquelyn Mass, MD;  Location: [facility] INVASIVE CV LAB;  Service: Cardiovascular;  Laterality: Left;   TONSILLECTOMY     Family History  Problem Relation Age of Onset   Diabetes Mother    Kidney disease Mother    Breast cancer Mother    Social History   Socioeconomic History   Marital status: Married    Spouse name: Not on file   Number of children: Not on file   Years of education: Not on file   Highest education level: Not on file  Occupational History   Not on file  Tobacco Use   Smoking status: Never   Smokeless tobacco: Never  Vaping Use   Vaping status: Never Used  Substance and Sexual Activity   Alcohol use: No   Drug use: Never   Sexual activity: Yes  Other Topics Concern   Not on file  Social History Narrative   Tobacco use, amount per day now: No   Past tobacco use, amount per day:   How many years did you use tobacco:   Alcohol use (drinks per week): No   Diet:   Do you drink/eat things with caffeine: Yes   Marital status:   Married                               What year were you married? 2015   Do you live in a house, apartment, assisted living, condo, trailer, etc.? Townhouse   Is it one or more stories? 2 floor   How many persons live in your home? 2   Do you have pets in your home?( please list) No   Highest Level of education completed? Some College.   Current or past profession: Retail.   Do you exercise?  Type and how often?   Do you have a living will? No   Do you have a DNR form?                                   If not, do you want to discuss one?   Do you have signed POA/HPOA forms?                        If so, please bring to you appointment      Do you have  any difficulty bathing or dressing yourself? No   Do you have any difficulty preparing food or eating? No   Do you have any difficulty managing your medications? No   Do you have any difficulty managing your finances? No   Do you have any difficulty affording your medications? No   Social Drivers of Corporate investment banker Strain: Not on file  Food Insecurity: No Food Insecurity (07/29/2023)   Hunger Vital Sign    Worried About Running Out of Food in the Last Year: Never true    Ran Out of Food in the Last Year: Never true  Transportation Needs: No Transportation Needs (07/29/2023)   PRAPARE - Administrator, Civil Service (Medical): No    Lack of Transportation (Non-Medical): No  Physical Activity: Not on file  Stress: Not on file  Social Connections: Not on file    Tobacco Counseling Counseling given: Not Answered   Clinical Intake:  Pre-visit preparation completed: No  Pain : 0-10 Pain Score: 3  Pain Type: Chronic pain Pain Location: Back Pain Orientation: Other (Comment) (Flank area) Pain Radiating Towards: No Pain Descriptors / Indicators: Aching Pain Onset: More than a month ago Pain Frequency: Intermittent Pain Relieving Factors: Ibuprofen Effect of Pain on Daily Activities: No  Pain Relieving Factors: Ibuprofen  BMI - recorded: 34.27 Nutritional Status: BMI > 30  Obese Nutritional Risks:  (loose stool chronic) Diabetes: Yes CBG done?: Yes (183) CBG resulted in Enter/ Edit results?: No Did pt. bring in CBG monitor from home?:  (140 -150's)  How often do you need to have someone help you when you read instructions, pamphlets, or other written materials from your doctor or pharmacy?: 1 - Never What is the last grade level you completed in school?: 2 yrs college  Interpreter Needed?: No      Activities of Daily Living    07/29/2023   10:14 AM  In your present state of health, do you have any difficulty performing the following  activities:  Hearing? 0  Vision? 0  Difficulty concentrating or making decisions? 1  Comment Remembering  Walking or climbing stairs? 1  Comment sometimes heavy breathing  Dressing or bathing? 0  Doing errands, shopping? 0  Preparing Food and eating ? N  Using the Toilet? N  In the past six months, have you accidently leaked urine? N  Do you have problems with loss of bowel control? Y  Comment loose bowel  Managing your Medications? N  Managing your Finances? N  Housekeeping or managing your Housekeeping? N    Patient Care Team: Yeira Gulden, Elijio Guadeloupe, NP as PCP - General (Family Medicine) Sheryle Donning, MD as PCP - Cardiology (Cardiology) Timothy Ford, MD as Consulting Physician (Orthopedic Surgery) Dot Gazella, DPM as Consulting Physician (Podiatry) [facility]  Indicate any recent Medical Services  you may have received from other than [facility] providers in the past year (date may be approximate).     Assessment:   This is a routine wellness examination for Tim Becker.  Hearing/Vision screen Hearing Screening - Comments:: No hearing issues Vision Screening - Comments:: Eye exam tommorow Dr.Groat.   Goals Addressed   None    Depression Screen    07/29/2023    9:48 AM 04/28/2023    9:04 AM 03/11/2023    1:41 PM 01/09/2023    9:16 AM 07/25/2022   12:57 PM 04/26/2022   10:55 AM  PHQ 2/9 Scores  PHQ - 2 Score 0 0 1 0 0 0    Fall Risk    07/29/2023    9:48 AM 04/28/2023    9:04 AM 03/11/2023    1:41 PM 01/09/2023    9:16 AM 10/25/2022    8:21 AM  Fall Risk   Falls in the past year? 0 0 0 0 0  Number falls in past yr: 0 0 0  0  Injury with Fall? 0 0 0  0  Risk for fall due to : No Fall Risks    No Fall Risks  Follow up Falls evaluation completed    Falls evaluation completed    MEDICARE RISK AT HOME: Medicare Risk at Home Any stairs in or around the home?: Yes If so, are there any without handrails?: No Home free of loose throw rugs in  walkways, pet beds, electrical cords, etc?: No Adequate lighting in your home to reduce risk of falls?: Yes Life alert?: No Use of a cane, walker or w/c?: No Grab bars in the bathroom?: No Shower chair or bench in shower?: Yes (shower chair) Elevated toilet seat or a handicapped toilet?: No  TIMED UP AND GO:  Was the test performed?  No    Cognitive Function:        07/29/2023    9:49 AM 07/25/2022   12:58 PM  6CIT Screen  What Year? 0 points 0 points  What month? 0 points 0 points  What time? 0 points 0 points  Count back from 20 0 points 0 points  Months in reverse 0 points 0 points  Repeat phrase 0 points 2 points  Total Score 0 points 2 points    Immunizations Immunization History  Administered Date(s) Administered   Hepatitis B, ADULT 12/30/2014, 01/27/2015, 03/01/2015, 06/30/2015   Influenza Split 07/31/2011, 01/31/2020   Influenza, Seasonal, Injecte, Preservative Fre 12/25/2022   Influenza,inj,Quad PF,6+ Mos 01/19/2020, 12/27/2020, 04/26/2021, 01/16/2022   Influenza-Unspecified 01/19/2020   Moderna Covid-19 Fall Seasonal Vaccine 45yrs & older 04/19/2022   PFIZER(Purple Top)SARS-COV-2 Vaccination 06/11/2019, 07/07/2019, 12/31/2019, 01/31/2020, 08/31/2020, 10/11/2020   PNEUMOCOCCAL CONJUGATE-20 01/16/2022   Pfizer Covid-19 Vaccine Bivalent Booster 89yrs & up 04/26/2021   Pfizer(Comirnaty)Fall Seasonal Vaccine 12 years and older 12/25/2022   Pneumococcal Polysaccharide-23 05/22/2015   Tdap 03/01/2017   Unspecified SARS-COV-2 Vaccination 05/23/2023    TDAP status: Up to date  Flu Vaccine status: Up to date  Pneumococcal vaccine status: Up to date  Covid-19 vaccine status: Completed vaccines  Qualifies for Shingles Vaccine? Yes   Zostavax completed No   Shingrix Completed?: No.    Education has been provided regarding the importance of this vaccine. Patient has been advised to call insurance company to determine out of pocket expense if they have not yet  received this vaccine. Advised may also receive vaccine at local pharmacy or Health Dept. Verbalized acceptance and understanding.  Screening Tests Health  Maintenance  Topic Date Due   FOOT EXAM  Never done   Zoster Vaccines- Shingrix (1 of 2) Never done   COVID-19 Vaccine (10 - 2024-25 season) 07/18/2023   OPHTHALMOLOGY EXAM  07/23/2023   HEMOGLOBIN A1C  10/26/2023   INFLUENZA VACCINE  10/31/2023   Medicare Annual Wellness (AWV)  07/28/2024   DTaP/Tdap/Td (2 - Td or Tdap) 03/02/2027   Colonoscopy  11/14/2031   Pneumococcal Vaccine 9-39 Years old  Completed   Hepatitis C Screening  Completed   HIV Screening  Completed   HPV VACCINES  Aged Out   Meningococcal B Vaccine  Aged Out    Health Maintenance  Health Maintenance Due  Topic Date Due   FOOT EXAM  Never done   Zoster Vaccines- Shingrix (1 of 2) Never done   COVID-19 Vaccine (10 - 2024-25 season) 07/18/2023   OPHTHALMOLOGY EXAM  07/23/2023    Colorectal cancer screening: Type of screening: Colonoscopy. Completed 11/13/2021. Repeat every 10 years  Lung Cancer Screening: (Low Dose CT Chest recommended if Age 44-80 years, 20 pack-year currently smoking OR have quit w/in 15years.) does qualify.   Lung Cancer Screening Referral: decline   Additional Screening:  Hepatitis C Screening: does not qualify; Completed yes   Vision Screening: Recommended annual ophthalmology exams for early detection of glaucoma and other disorders of the eye. Is the patient up to date with their annual eye exam?  Yes  Who is the provider or what is the name of the office in which the patient attends annual eye exams? Dr.Groat  If pt is not established with a provider, would they like to be referred to a provider to establish care? No .   Dental Screening: Recommended annual dental exams for proper oral hygiene  Diabetic Foot Exam: Diabetic Foot Exam: Completed Has upcoming appointment with Podiatrist last seen 2024   Community Resource  Referral / Chronic Care Management: CRR required this visit?  No   CCM required this visit?  No     Plan:     I have personally reviewed and noted the following in the patient's chart:   Medical and social history Use of alcohol, tobacco or illicit drugs  Current medications and supplements including opioid prescriptions. Patient is not currently taking opioid prescriptions. Functional ability and status Nutritional status Physical activity Advanced directives List of other physicians Hospitalizations, surgeries, and ER visits in previous 12 months Vitals Screenings to include cognitive, depression, and falls Referrals and appointments  In addition, I have reviewed and discussed with patient certain preventive protocols, quality metrics, and best practice recommendations. A written personalized care plan for preventive services as well as general preventive health recommendations were provided to patient.     Estil Heman, NP   07/29/2023   After Visit Summary: (MyChart) Due to this being a telephonic visit, the after visit summary with patients personalized plan was offered to patient via MyChart   Nurse Notes: Has upcoming appointment for Podiatrist and Ophthalmologist   I connected with  Tim Becker on 07/29/23 by a video enabled telemedicine application and verified that I am speaking with the correct person using two identifiers.   I discussed the limitations of evaluation and management by telemedicine. The patient expressed understanding and agreed to proceed.   Spent 25 minutes of face to face with patient  >50% time spent counseling; reviewing medical record; tests; labs; and developing future plan of care.

## 2023-07-29 NOTE — Patient Instructions (Signed)
 Tim Becker , Thank you for taking time to come for your Medicare Wellness Visit. I appreciate your ongoing commitment to your health goals. Please review the following plan we discussed and let me know if I can assist you in the future.   Screening recommendations/referrals: Colonoscopy : Up to date  Recommended yearly ophthalmology/optometry visit for glaucoma screening and checkup Recommended yearly dental visit for hygiene and checkup  Vaccinations: Influenza vaccine due annually in September/October Pneumococcal vaccine : Up to date  Tdap vaccine : Up to date  Shingles vaccine : Up to date     Advanced directives: Yes   Conditions/risks identified: advanced age (>87men, >17 women);diabetes mellitus;hypertension;male gender;obesity (BMI >30kg/m2);smoking/ tobacco exposure;sedentary lifestyle  Next appointment: 1 year   Preventive Care 75-64 Years, Male Preventive care refers to lifestyle choices and visits with your health care provider that can promote health and wellness. What does preventive care include? A yearly physical exam. This is also called an annual well check. Dental exams once or twice a year. Routine eye exams. Ask your health care provider how often you should have your eyes checked. Personal lifestyle choices, including: Daily care of your teeth and gums. Regular physical activity. Eating a healthy diet. Avoiding tobacco and drug use. Limiting alcohol use. Practicing safe sex. Taking low-dose aspirin  every day starting at age 80. What happens during an annual well check? The services and screenings done by your health care provider during your annual well check will depend on your age, overall health, lifestyle risk factors, and family history of disease. Counseling  Your health care provider may ask you questions about your: Alcohol use. Tobacco use. Drug use. Emotional well-being. Home and relationship well-being. Sexual activity. Eating habits. Work  and work Astronomer. Screening  You may have the following tests or measurements: Height, weight, and BMI. Blood pressure. Lipid and cholesterol levels. These may be checked every 5 years, or more frequently if you are over 59 years old. Skin check. Lung cancer screening. You may have this screening every year starting at age 53 if you have a 30-pack-year history of smoking and currently smoke or have quit within the past 15 years. Fecal occult blood test (FOBT) of the stool. You may have this test every year starting at age 70. Flexible sigmoidoscopy or colonoscopy. You may have a sigmoidoscopy every 5 years or a colonoscopy every 10 years starting at age 52. Prostate cancer screening. Recommendations will vary depending on your family history and other risks. Hepatitis C blood test. Hepatitis B blood test. Sexually transmitted disease (STD) testing. Diabetes screening. This is done by checking your blood sugar (glucose) after you have not eaten for a while (fasting). You may have this done every 1-3 years. Discuss your test results, treatment options, and if necessary, the need for more tests with your health care provider. Vaccines  Your health care provider may recommend certain vaccines, such as: Influenza vaccine. This is recommended every year. Tetanus, diphtheria, and acellular pertussis (Tdap, Td) vaccine. You may need a Td booster every 10 years. Zoster vaccine. You may need this after age 67. Pneumococcal 13-valent conjugate (PCV13) vaccine. You may need this if you have certain conditions and have not been vaccinated. Pneumococcal polysaccharide (PPSV23) vaccine. You may need one or two doses if you smoke cigarettes or if you have certain conditions. Talk to your health care provider about which screenings and vaccines you need and how often you need them. This information is not intended to replace advice given  to you by your health care provider. Make sure you discuss any  questions you have with your health care provider. Document Released: 04/14/2015 Document Revised: 12/06/2015 Document Reviewed: 01/17/2015 Elsevier Interactive Patient Education  2017 [employer].  Fall Prevention in the Home Falls can cause injuries. They can happen to people of all ages. There are many things you can do to make your home safe and to help prevent falls. What can I do on the outside of my home? Regularly fix the edges of walkways and driveways and fix any cracks. Remove anything that might make you trip as you walk through a door, such as a raised step or threshold. Trim any bushes or trees on the path to your home. Use bright outdoor lighting. Clear any walking paths of anything that might make someone trip, such as rocks or tools. Regularly check to see if handrails are loose or broken. Make sure that both sides of any steps have handrails. Any raised decks and porches should have guardrails on the edges. Have any leaves, snow, or ice cleared regularly. Use sand or salt on walking paths during winter. Clean up any spills in your garage right away. This includes oil or grease spills. What can I do in the bathroom? Use night lights. Install grab bars by the toilet and in the tub and shower. Do not use towel bars as grab bars. Use non-skid mats or decals in the tub or shower. If you need to sit down in the shower, use a plastic, non-slip stool. Keep the floor dry. Clean up any water that spills on the floor as soon as it happens. Remove soap buildup in the tub or shower regularly. Attach bath mats securely with double-sided non-slip rug tape. Do not have throw rugs and other things on the floor that can make you trip. What can I do in the bedroom? Use night lights. Make sure that you have a light by your bed that is easy to reach. Do not use any sheets or blankets that are too big for your bed. They should not hang down onto the floor. Have a firm chair that has side  arms. You can use this for support while you get dressed. Do not have throw rugs and other things on the floor that can make you trip. What can I do in the kitchen? Clean up any spills right away. Avoid walking on wet floors. Keep items that you use a lot in easy-to-reach places. If you need to reach something above you, use a strong step stool that has a grab bar. Keep electrical cords out of the way. Do not use floor polish or wax that makes floors slippery. If you must use wax, use non-skid floor wax. Do not have throw rugs and other things on the floor that can make you trip. What can I do with my stairs? Do not leave any items on the stairs. Make sure that there are handrails on both sides of the stairs and use them. Fix handrails that are broken or loose. Make sure that handrails are as long as the stairways. Check any carpeting to make sure that it is firmly attached to the stairs. Fix any carpet that is loose or worn. Avoid having throw rugs at the top or bottom of the stairs. If you do have throw rugs, attach them to the floor with carpet tape. Make sure that you have a light switch at the top of the stairs and the bottom of the  stairs. If you do not have them, ask someone to add them for you. What else can I do to help prevent falls? Wear shoes that: Do not have high heels. Have rubber bottoms. Are comfortable and fit you well. Are closed at the toe. Do not wear sandals. If you use a stepladder: Make sure that it is fully opened. Do not climb a closed stepladder. Make sure that both sides of the stepladder are locked into place. Ask someone to hold it for you, if possible. Clearly mark and make sure that you can see: Any grab bars or handrails. First and last steps. Where the edge of each step is. Use tools that help you move around (mobility aids) if they are needed. These include: Canes. Walkers. Scooters. Crutches. Turn on the lights when you go into a dark area.  Replace any light bulbs as soon as they burn out. Set up your furniture so you have a clear path. Avoid moving your furniture around. If any of your floors are uneven, fix them. If there are any pets around you, be aware of where they are. Review your medicines with your doctor. Some medicines can make you feel dizzy. This can increase your chance of falling. Ask your doctor what other things that you can do to help prevent falls. This information is not intended to replace advice given to you by your health care provider. Make sure you discuss any questions you have with your health care provider. Document Released: 01/12/2009 Document Revised: 08/24/2015 Document Reviewed: 04/22/2014 Elsevier Interactive Patient Education  2017 [employer].

## 2023-07-30 ENCOUNTER — Encounter: Payer: 59 | Admitting: Family

## 2023-07-30 DIAGNOSIS — E103592 Type 1 diabetes mellitus with proliferative diabetic retinopathy without macular edema, left eye: Secondary | ICD-10-CM | POA: Diagnosis not present

## 2023-07-30 DIAGNOSIS — E10319 Type 1 diabetes mellitus with unspecified diabetic retinopathy without macular edema: Secondary | ICD-10-CM | POA: Diagnosis not present

## 2023-07-30 DIAGNOSIS — Z961 Presence of intraocular lens: Secondary | ICD-10-CM | POA: Diagnosis not present

## 2023-07-31 DIAGNOSIS — G4733 Obstructive sleep apnea (adult) (pediatric): Secondary | ICD-10-CM | POA: Diagnosis not present

## 2023-08-01 MED FILL — ATORVASTATIN 40 MG TABLET: ORAL | 90 days supply | Qty: 90 | Fill #1

## 2023-08-05 DIAGNOSIS — Z940 Kidney transplant status: Principal | ICD-10-CM

## 2023-08-05 MED ORDER — FUROSEMIDE 80 MG TABLET
ORAL_TABLET | Freq: Two times a day (BID) | ORAL | 11 refills | 15.00000 days | Status: CP | PRN
Start: 2023-08-05 — End: 2024-08-04
  Filled 2023-08-06: qty 30, 15d supply, fill #0

## 2023-08-06 ENCOUNTER — Other Ambulatory Visit: Payer: Self-pay | Admitting: Family

## 2023-08-06 ENCOUNTER — Other Ambulatory Visit (HOSPITAL_COMMUNITY)
Admission: RE | Admit: 2023-08-06 | Discharge: 2023-08-06 | Disposition: A | Discharge status: Home / Self Care | Source: Ambulatory Visit | Attending: Obstetrics and Gynecology | Admitting: Obstetrics and Gynecology

## 2023-08-06 DIAGNOSIS — D8940 Mast cell activation, unspecified: Secondary | ICD-10-CM | POA: Diagnosis not present

## 2023-08-06 DIAGNOSIS — Z940 Kidney transplant status: Secondary | ICD-10-CM | POA: Diagnosis not present

## 2023-08-06 LAB — CBC WITH DIFFERENTIAL/PLATELET
Abs Immature Granulocytes: 0.06 10*3/uL (ref 0.00–0.07)
Basophils Absolute: 0 10*3/uL (ref 0.0–0.1)
Basophils Relative: 0 %
Eosinophils Absolute: 0.1 10*3/uL (ref 0.0–0.5)
Eosinophils Relative: 1 %
HCT: 38 % — ABNORMAL LOW (ref 39.0–52.0)
Hemoglobin: 10.7 g/dL — ABNORMAL LOW (ref 13.0–17.0)
Immature Granulocytes: 1 %
Lymphocytes Relative: 10 %
Lymphs Abs: 0.8 10*3/uL (ref 0.7–4.0)
MCH: 24.6 pg — ABNORMAL LOW (ref 26.0–34.0)
MCHC: 28.2 g/dL — ABNORMAL LOW (ref 30.0–36.0)
MCV: 87.4 fL (ref 80.0–100.0)
Monocytes Absolute: 0.9 10*3/uL (ref 0.1–1.0)
Monocytes Relative: 11 %
Neutro Abs: 6.3 10*3/uL (ref 1.7–7.7)
Neutrophils Relative %: 77 %
Platelets: 180 10*3/uL (ref 150–400)
RBC: 4.35 MIL/uL (ref 4.22–5.81)
RDW: 14 % (ref 11.5–15.5)
Smear Review: NORMAL
WBC: 8.1 10*3/uL (ref 4.0–10.5)
nRBC: 0 % (ref 0.0–0.2)

## 2023-08-06 LAB — BASIC METABOLIC PANEL WITH GFR
Anion gap: 9 (ref 5–15)
BUN: 51 mg/dL — ABNORMAL HIGH (ref 6–20)
CO2: 18 mmol/L — ABNORMAL LOW (ref 22–32)
Calcium: 9 mg/dL (ref 8.9–10.3)
Chloride: 114 mmol/L — ABNORMAL HIGH (ref 98–111)
Creatinine, Ser: 2.2 mg/dL — ABNORMAL HIGH (ref 0.61–1.24)
GFR, Estimated: 34 mL/min — ABNORMAL LOW (ref >60)
Glucose, Bld: 125 mg/dL — ABNORMAL HIGH (ref 70–99)
Potassium: 4.7 mmol/L (ref 3.5–5.1)
Sodium: 141 mmol/L (ref 135–145)

## 2023-08-06 LAB — PHOSPHORUS: Phosphorus: 4.3 mg/dL (ref 2.5–4.6)

## 2023-08-06 LAB — MAGNESIUM: Magnesium: 2 mg/dL (ref 1.7–2.4)

## 2023-08-06 MED ORDER — ACCU-CHEK SOFTCLIX LANCETS MISC: 3 refills | Status: DC

## 2023-08-06 NOTE — Telephone Encounter (Signed)
 Last Fill: 05/23/22  Last OV: 07/29/23 Next OV: 10/27/23  Routing to provider for review/authorization.

## 2023-08-06 NOTE — Telephone Encounter (Signed)
 Copied from CRM [id number]. Topic: Clinical - Medication Refill >> Aug 06, 2023  4:44 PM Brynn Caras wrote: Medication: glucose blood - ONETOUCH Lancets   Has the patient contacted their pharmacy? Yes (Agent: If no, request that the patient contact the pharmacy for the refill. If patient does not wish to contact the pharmacy document the reason why and proceed with request.) (Agent: If yes, when and what did the pharmacy advise?)  This is the patient's preferred pharmacy:   [facility] [address] [place], [place] - [address] [place] [address] Phone: [phone] Fax: [phone]   Is this the correct pharmacy for this prescription? Yes If no, delete pharmacy and type the correct one.   Has the prescription been filled recently? No  Is the patient out of the medication? Yes  Has the patient been seen for an appointment in the last year OR does the patient have an upcoming appointment? Yes  Can we respond through MyChart? Yes  Agent: Please be advised that Rx refills may take up to 3 business days. We ask that you follow-up with your pharmacy.

## 2023-08-07 ENCOUNTER — Ambulatory Visit (INDEPENDENT_AMBULATORY_CARE_PROVIDER_SITE_OTHER): Admitting: Otolaryngology

## 2023-08-07 ENCOUNTER — Encounter (INDEPENDENT_AMBULATORY_CARE_PROVIDER_SITE_OTHER): Payer: Self-pay | Admitting: Otolaryngology

## 2023-08-07 ENCOUNTER — Other Ambulatory Visit: Payer: Self-pay

## 2023-08-07 VITALS — BP 145/73 | HR 77 | Ht 68.0 in | Wt 230.0 lb

## 2023-08-07 DIAGNOSIS — R040 Epistaxis: Secondary | ICD-10-CM

## 2023-08-07 MED ORDER — ACCU-CHEK SOFTCLIX LANCETS MISC: 3 refills | Status: DC

## 2023-08-07 NOTE — Progress Notes (Signed)
 Dear Dr. Arlis Lakes, Here is my assessment for our mutual patient, Woodford Saulter. Thank you for allowing me the opportunity to care for your patient. Please do not hesitate to contact me should you have any other questions. Sincerely, Dr. Milon Aloe  Otolaryngology Clinic Note Referring provider: Dr. Arlis Lakes HPI:  Tim Becker is a 58 y.o. male kindly referred by Dr. Arlis Lakes for evaluation of epistaxis.  Initial visit (04/2023): Epistaxis history: started last year, saw [facility] [facility], noted dry nose -- Rx mupirocin ; he reports it stopped for a little bit, then started again; He does use a Nasal CPAP. He does think it is getting more frequent HTN: yes Frequency: daily - does not really drip but will blow out clots Side: both but started on right and now on left CKD/Liver dysfunction: CKD/Kidney Dysfunction Anticoagulation/AP: ASA 81 Trauma: no History of Sinusitis: no Nasal obstruction: no Nasal procedures: no Current nasal medication use: saline spray; no irrigation, no flonase No history of frequent sinusitis  Personal or FHx of bleeding dz or anesthesia difficulty: no  --------------------------------------------------------- 06/10/2023 Still bleeding, not as much however. He reports that he does blow out some clots still when he blows his nose, primarily on left. Had 1-2 brisk bleeds; Has been using mupirocin  but stopped, now on ayr gel and saline spray. No sinus issues. Using nasal CPAP  --------------------------------------------------------- 08/07/2023 Over last few weeks, no nasal bleeding episodes. Slight bloody crust from right. Nothing from left. No pain, CRS sx, congestion or nasal obstruction.  GLP-1: Ozempic  AP/AC: ASA 81  PMHx: HTN, CAD, CKD on HD s/p renal transplant, Anemia, T2DM  Tobacco: no.   Independent Review of Additional Tests or Records:  Dr. Artis Binder (03/11/2023): noted blood clots from nose every time he wakes up; uses CPAP; very dry nose; used sprays without  relief Denette Finner (05/17/2021) - ENT; clear mucus, blowing nose; mild epistaxis b/l; using CPAP; no nasal sprays; noted dry nasal mucosa; Rx: mupirocin  and humidify CPAP; will check CT 05/31/2021 CT Sinus: No significant sinonasal opacification CT Neck 10/30/2017 independently reviewed and interpreted with attention to nasal cavity: no sinus opacification; limited eval CBC and BMP (04/16/2023 and 04/14/2023): Plt 183, Hgb 10.8, WBC 9.5, Eos 100; BMP: Cr 2.45, BUN 55 PMH/Meds/All/SocHx/FamHx/ROS:   Past Medical History:  Diagnosis Date   Anemia    Arthritis    Cancer (HCC)    Renal Tumor   CKD (chronic kidney disease)    Coronary artery disease    Diabetes (HCC)    DM (diabetes mellitus) with complications (HCC)    ESRD (end stage renal disease) (HCC)    Pre- dialysis   History of colonoscopy 2011   Hypertension    Kidney transplanted 2022   Left kidney mass    Neuropathy    Obesity    Osteomyelitis, chronic, ankle or foot (HCC)    PONV (postoperative nausea and vomiting)    Renal insufficiency    Patient is on Dialysis and receives M,W and F.   S/P BKA (below knee amputation) (HCC) 08/2014   Right , Jayson Michael, [place]   Sleep apnea    CPAP   Thyroid  disease      Past Surgical History:  Procedure Laterality Date   BASCILIC VEIN TRANSPOSITION Left 02/17/2015   Procedure: BASCILIC VEIN TRANSPOSITION;  Surgeon: Jackquelyn Mass, MD;  Location: [facility];  Service: Vascular;  Laterality: Left;   BELOW KNEE LEG AMPUTATION Right    CORONARY ANGIOPLASTY WITH STENT PLACEMENT     EYE  SURGERY     FOOT SURGERY     Multiple R foot surgery for infection and charcot foot   I & D EXTREMITY Left 04/16/2016   Procedure: IRRIGATION AND DEBRIDEMENT EXTREMITY/GREAT TOE AMP.;  Surgeon: Dot Gazella, DPM;  [facility];  Service: Podiatry;  Laterality: Left;   IR ANGIOGRAM FOLLOW UP STUDY  2025   JOINT REPLACEMENT     LAPAROSCOPIC NEPHRECTOMY, HAND ASSISTED Left 07/11/2015   Procedure:  HAND ASSISTED LAPAROSCOPIC NEPHRECTOMY;  Surgeon: Dustin Gimenez, MD;  Location: [facility] OR[facility];  Service: Urology;  Laterality: Left;   PERIPHERAL VASCULAR CATHETERIZATION Left 12/06/2014   Procedure: A/V Shuntogram/Fistulagram;  Surgeon: Jackquelyn Mass, MD;  Location: [facility];  Service: Cardiovascular;  Laterality: Left;   PERIPHERAL VASCULAR CATHETERIZATION Left 12/06/2014   Procedure: A/V Shunt Intervention;  Surgeon: Jackquelyn Mass, MD;  Location: [facility];  Service: Cardiovascular;  Laterality: Left;   TONSILLECTOMY      Family History  Problem Relation Age of Onset   Diabetes Mother    Kidney disease Mother    Breast cancer Mother      Social Connections: Not on file      Current Outpatient Medications:    Accu-Chek Softclix Lancets lancets, Test blood sugar 3 times a day. DX: E11.40, Disp: 100 each, Rfl: 3   acetaminophen  (TYLENOL ) 500 MG tablet, Take 500 mg by mouth every 6 (six) hours as needed for moderate pain or mild pain., Disp: , Rfl:    allopurinol  (ZYLOPRIM ) 100 MG tablet, Take 100 mg by mouth every other day., Disp: , Rfl:    amLODipine  (NORVASC ) 10 MG tablet, Take 10 mg by mouth in the morning. 9am, Disp: , Rfl:    aspirin  EC 81 MG tablet, Take 81 mg by mouth daily. Swallow whole., Disp: , Rfl:    atorvastatin  (LIPITOR) 40 MG tablet, Take 40 mg by mouth daily. 9pm., Disp: , Rfl:    Blood Glucose Monitoring Suppl (ACCU-CHEK AVIVA PLUS) w/Device KIT, Use to test blood sugar three times daily. Dx: E11.40, Disp: 1 kit, Rfl: 0   Blood Glucose Monitoring Suppl (ONE TOUCH ULTRA 2) w/Device KIT, by Does not apply route., Disp: , Rfl:    carvedilol  (COREG ) 12.5 MG tablet, Take 37.5 mg by mouth in the morning, at noon, and at bedtime. 9am , and 9pm, Disp: , Rfl:    chlorthalidone (HYGROTON) 25 MG tablet, Take 25 mg by mouth daily., Disp: , Rfl:    cholecalciferol (VITAMIN D3) 25 MCG (1000 UNIT) tablet, Take 1,000 Units by mouth daily., Disp: , Rfl:     empagliflozin (JARDIANCE) 10 MG TABS tablet, Take 10 mg by mouth daily., Disp: , Rfl:    ergocalciferol (VITAMIN D2) 1.25 MG (50000 UT) capsule, Take 50,000 Units by mouth once a week., Disp: , Rfl:    furosemide (LASIX) 80 MG tablet, Take 80 mg by mouth as needed for edema or fluid., Disp: , Rfl:    gabapentin (NEURONTIN) 100 MG capsule, Take 100 mg by mouth 2 (two) times daily., Disp: , Rfl:    glucose blood (ONETOUCH ULTRA BLUE TEST) test strip, 1 each by Other route as needed for other. Use as instructed, Disp: , Rfl:    glucose blood test strip, 1 each by Other route 3 (three) times daily. Use as instructed, Disp: 100 each, Rfl: 12   hydrALAZINE  (APRESOLINE ) 25 MG tablet, Take 100 mg by mouth in the morning and at bedtime., Disp: , Rfl:  insulin  glargine (LANTUS  SOLOSTAR) 100 UNIT/ML Solostar Pen, Inject 35 Units into the skin daily. INJECT 35 UNITS SUBCUTANEOUSLY ONCE DAILY BEFORE BREAKFAST, Disp: 15 mL, Rfl: 11   insulin  lispro (HUMALOG ) 100 UNIT/ML KwikPen, INJECT 3-17 UNITS INTO THE SKIN 2 TIMES PER DAY AS NEEDED FOR HIGH BLOOD SUGAR, Disp: 15 mL, Rfl: 1   Insulin  Pen Needle (BD PEN NEEDLE NANO 2ND GEN) 32G X 4 MM MISC, Use to test blood sugar four times daily. Dx:E11.40, Disp: 400 each, Rfl: 5   levothyroxine  (SYNTHROID ) 75 MCG tablet, Take 75 mcg by mouth daily before breakfast. 7am, Disp: , Rfl:    mupirocin  ointment (BACTROBAN ) 2 %, Apply 1 Application topically 2 (two) times daily. To affected area till better, Disp: 22 g, Rfl: 0   mycophenolate  (MYFORTIC ) 180 MG EC tablet, Take 3 tablets (540 mg total) by mouth in the morning and at bedtime. 3 tablets (540 mg total per dose) at 9am and 9pm, Disp: , Rfl:    saline (AYR) GEL, Place 1 Application into both nostrils every 4 (four) hours., Disp: 14 g, Rfl: 5   Semaglutide , 1 MG/DOSE, (OZEMPIC , 1 MG/DOSE,) 4 MG/3ML SOPN, INJECT 1 MG INTO THE SKIN ONCE A WEEK AT 9AM, Disp: 3 mL, Rfl: 11   spironolactone (ALDACTONE) 25 MG tablet, Take 50 mg  by mouth every morning., Disp: , Rfl:    tacrolimus  ER (ENVARSUS  XR) 1 MG TB24, Take 5 mg by mouth daily before breakfast., Disp: , Rfl:    triamcinolone  cream (KENALOG ) 0.1 %, Apply 1 Application topically 2 (two) times daily. Apply to affected areas on abdomen, Disp: 30 g, Rfl: 0   Physical Exam:   BP (!) 145/73 (BP Location: Right Arm, Patient Position: Sitting, Cuff Size: Large)   Pulse 77   Ht 5\' 8"  (1.727 m)   Wt 230 lb (104.3 kg)   SpO2 94%   BMI 34.97 kg/m   Salient findings:  CN II-XII intact  Anterior rhinoscopy: Septum dev left, bilateral minimal bloody crusting and bilateral ITH; nasal cavity dryness improved bilateral inferior turbinates without significant hypertrophy; Nasal endoscopy was indicated to better evaluate the nose and paranasal sinuses, given the patient's history and exam findings, and is detailed below. No lesions of oral cavity/oropharynx  Seprately Identifiable Procedures:  PROCEDURE: Bilateral Diagnostic Rigid Nasal Endoscopy Pre-procedure diagnosis: Concern for persistent epistaxis, now on right Post-procedure diagnosis: same Indication: See pre-procedure diagnosis and physical exam above Complications: None apparent EBL: 0 mL Anesthesia: Lidocaine  4% and topical decongestant was topically sprayed in each nasal cavity  Description of Procedure:  Patient was identified. A rigid 30 degree endoscope was utilized to evaluate the sinonasal cavities, mucosa, sinus ostia and turbinates and septum.  Overall, signs of mucosal inflammation are not noted.  Also noted are right prominent septal vessel mid-septum, no active bleeding.  No mucopurulence, polyps, or masses noted.   Right Middle meatus: clear Right SE Recess: clear Left MM: clear Left SE Recess: clear Photodocumentation was obtained.  CPT CODE -- 16109 - Mod 25   Impression & Plans:  Viren Schenone is a 58 y.o. male with CKD now with:  1. Epistaxis    Left sided much improved after cautery;  right slight bloody crust; endo reassuring We discussed cautery on right but since not having significant episodes, he opted to continue humidification  - continue q4h AYR Gel especially at night - Also can use ocean spray multiple times per day x6 weeks - Continue increased CPAP humidification  F/u  PRN - if sx worsen on right   Thank you for allowing me the opportunity to care for your patient. Please do not hesitate to contact me should you have any other questions.  Sincerely, Milon Aloe, MD Otolaryngologist (ENT), [facility] Phone: [phone] Fax: [phone]  08/07/2023, 1:30 PM   MDM:  Level 3 - 99213 Complexity/Problems addressed: low - chronic problem, persistent Data complexity: low - independent review and interpretation of imaging; review of notes, labs - Morbidity: low - Prescription Drug prescribed or managed: no

## 2023-08-08 LAB — TACROLIMUS LEVEL: Tacrolimus (FK506) - LabCorp: 7.7 ng/mL (ref 5.0–20.0)

## 2023-08-11 DIAGNOSIS — Z940 Kidney transplant status: Principal | ICD-10-CM

## 2023-08-11 MED ORDER — ERGOCALCIFEROL (VITAMIN D2) 1,250 MCG (50,000 UNIT) CAPSULE
ORAL_CAPSULE | ORAL | 2 refills | 28.00000 days
Start: 2023-08-11 — End: 2023-10-28

## 2023-08-12 ENCOUNTER — Ambulatory Visit (INDEPENDENT_AMBULATORY_CARE_PROVIDER_SITE_OTHER)

## 2023-08-12 DIAGNOSIS — Z940 Kidney transplant status: Principal | ICD-10-CM

## 2023-08-12 MED ORDER — ERGOCALCIFEROL (VITAMIN D2) 1,250 MCG (50,000 UNIT) CAPSULE
ORAL_CAPSULE | ORAL | 2 refills | 28.00000 days
Start: 2023-08-12 — End: 2023-10-29

## 2023-08-14 MED FILL — ALLOPURINOL 100 MG TABLET: ORAL | 90 days supply | Qty: 45 | Fill #3

## 2023-08-14 MED FILL — MYCOPHENOLATE SODIUM 180 MG TABLET,DELAYED RELEASE: ORAL | 30 days supply | Qty: 180 | Fill #7

## 2023-08-14 MED FILL — GABAPENTIN 100 MG CAPSULE: ORAL | 30 days supply | Qty: 60 | Fill #4

## 2023-08-24 DIAGNOSIS — Z940 Kidney transplant status: Principal | ICD-10-CM

## 2023-08-24 MED ORDER — HYDRALAZINE 100 MG TABLET
ORAL_TABLET | Freq: Two times a day (BID) | ORAL | 3 refills | 90.00000 days
Start: 2023-08-24 — End: 2024-08-23

## 2023-08-25 DIAGNOSIS — Z940 Kidney transplant status: Principal | ICD-10-CM

## 2023-08-25 MED ORDER — HYDRALAZINE 100 MG TABLET
ORAL_TABLET | Freq: Two times a day (BID) | ORAL | 3 refills | 90.00000 days | Status: CP
Start: 2023-08-25 — End: 2024-08-24
  Filled 2023-08-27: qty 180, 90d supply, fill #0

## 2023-08-27 MED FILL — JARDIANCE 25 MG TABLET: ORAL | 90 days supply | Qty: 90 | Fill #2

## 2023-08-27 MED FILL — AMLODIPINE 10 MG TABLET: ORAL | 90 days supply | Qty: 90 | Fill #2

## 2023-08-27 MED FILL — CARVEDILOL 12.5 MG TABLET: ORAL | 90 days supply | Qty: 540 | Fill #2

## 2023-08-27 MED FILL — CHLORTHALIDONE 25 MG TABLET: ORAL | 90 days supply | Qty: 180 | Fill #6

## 2023-08-28 DIAGNOSIS — R002 Palpitations: Secondary | ICD-10-CM | POA: Diagnosis not present

## 2023-08-28 DIAGNOSIS — I255 Ischemic cardiomyopathy: Secondary | ICD-10-CM | POA: Diagnosis not present

## 2023-08-28 DIAGNOSIS — E1169 Type 2 diabetes mellitus with other specified complication: Secondary | ICD-10-CM | POA: Diagnosis not present

## 2023-08-28 DIAGNOSIS — I10 Essential (primary) hypertension: Secondary | ICD-10-CM | POA: Diagnosis not present

## 2023-08-28 DIAGNOSIS — R0600 Dyspnea, unspecified: Secondary | ICD-10-CM | POA: Diagnosis not present

## 2023-08-28 DIAGNOSIS — I2510 Atherosclerotic heart disease of native coronary artery without angina pectoris: Secondary | ICD-10-CM | POA: Diagnosis not present

## 2023-08-29 DIAGNOSIS — Z940 Kidney transplant status: Principal | ICD-10-CM

## 2023-08-31 DIAGNOSIS — G4733 Obstructive sleep apnea (adult) (pediatric): Secondary | ICD-10-CM | POA: Diagnosis not present

## 2023-09-03 DIAGNOSIS — Z940 Kidney transplant status: Principal | ICD-10-CM

## 2023-09-15 MED FILL — MYCOPHENOLATE SODIUM 180 MG TABLET,DELAYED RELEASE: ORAL | 30 days supply | Qty: 180 | Fill #8

## 2023-09-15 MED FILL — GABAPENTIN 100 MG CAPSULE: ORAL | 30 days supply | Qty: 60 | Fill #5

## 2023-09-22 DIAGNOSIS — Z940 Kidney transplant status: Principal | ICD-10-CM

## 2023-09-22 DIAGNOSIS — R82998 Other abnormal findings in urine: Principal | ICD-10-CM

## 2023-09-22 DIAGNOSIS — R7989 Other specified abnormal findings of blood chemistry: Principal | ICD-10-CM

## 2023-09-24 ENCOUNTER — Encounter: Admit: 2023-09-24 | Discharge: 2023-09-25 | Payer: Medicare (Managed Care)

## 2023-09-24 ENCOUNTER — Ambulatory Visit: Admit: 2023-09-24 | Discharge: 2023-09-25 | Payer: Medicare (Managed Care)

## 2023-09-24 DIAGNOSIS — R82998 Other abnormal findings in urine: Principal | ICD-10-CM

## 2023-09-24 DIAGNOSIS — Z940 Kidney transplant status: Principal | ICD-10-CM

## 2023-09-24 DIAGNOSIS — Z7960 Long term (current) use of unspecified immunomodulators and immunosuppressants: Secondary | ICD-10-CM | POA: Diagnosis not present

## 2023-09-24 DIAGNOSIS — I151 Hypertension secondary to other renal disorders: Principal | ICD-10-CM

## 2023-09-24 DIAGNOSIS — R7989 Other specified abnormal findings of blood chemistry: Principal | ICD-10-CM

## 2023-09-24 DIAGNOSIS — N200 Calculus of kidney: Principal | ICD-10-CM

## 2023-09-24 MED ORDER — HYDRALAZINE 100 MG TABLET
ORAL_TABLET | Freq: Three times a day (TID) | ORAL | 3 refills | 90.00000 days | Status: CP
Start: 2023-09-24 — End: 2024-09-23
  Filled 2023-11-11: qty 270, 90d supply, fill #0

## 2023-09-24 MED ORDER — POTASSIUM CITRATE ER 15 MEQ (1,620 MG) TABLET,EXTENDED RELEASE
ORAL_TABLET | Freq: Two times a day (BID) | ORAL | 3 refills | 0.00000 days | Status: CP
Start: 2023-09-24 — End: 2024-09-23
  Filled 2023-09-25: qty 120, 60d supply, fill #0

## 2023-09-25 DIAGNOSIS — Z940 Kidney transplant status: Principal | ICD-10-CM

## 2023-09-29 DIAGNOSIS — N200 Calculus of kidney: Secondary | ICD-10-CM | POA: Diagnosis not present

## 2023-09-29 DIAGNOSIS — Z940 Kidney transplant status: Secondary | ICD-10-CM | POA: Diagnosis not present

## 2023-09-30 DIAGNOSIS — Z940 Kidney transplant status: Principal | ICD-10-CM

## 2023-09-30 DIAGNOSIS — G4733 Obstructive sleep apnea (adult) (pediatric): Secondary | ICD-10-CM | POA: Diagnosis not present

## 2023-10-01 DIAGNOSIS — Z940 Kidney transplant status: Principal | ICD-10-CM

## 2023-10-02 MED FILL — LEVOTHYROXINE 75 MCG TABLET: ORAL | 90 days supply | Qty: 90 | Fill #3

## 2023-10-02 MED FILL — SPIRONOLACTONE 25 MG TABLET: ORAL | 90 days supply | Qty: 180 | Fill #2

## 2023-10-06 DIAGNOSIS — Z940 Kidney transplant status: Principal | ICD-10-CM

## 2023-10-09 MED FILL — GABAPENTIN 100 MG CAPSULE: ORAL | 30 days supply | Qty: 60 | Fill #6

## 2023-10-09 MED FILL — MYCOPHENOLATE SODIUM 180 MG TABLET,DELAYED RELEASE: ORAL | 30 days supply | Qty: 180 | Fill #9

## 2023-10-13 ENCOUNTER — Other Ambulatory Visit (HOSPITAL_COMMUNITY)
Admission: RE | Admit: 2023-10-13 | Discharge: 2023-10-13 | Disposition: A | Discharge status: Home / Self Care | Source: Ambulatory Visit | Attending: Pediatric Nephrology | Admitting: Pediatric Nephrology

## 2023-10-13 DIAGNOSIS — Z940 Kidney transplant status: Principal | ICD-10-CM

## 2023-10-15 ENCOUNTER — Encounter: Payer: Self-pay | Admitting: Podiatry

## 2023-10-15 ENCOUNTER — Ambulatory Visit (INDEPENDENT_AMBULATORY_CARE_PROVIDER_SITE_OTHER): Admitting: Podiatry

## 2023-10-15 VITALS — Ht 68.0 in | Wt 230.0 lb

## 2023-10-15 DIAGNOSIS — E119 Type 2 diabetes mellitus without complications: Secondary | ICD-10-CM | POA: Diagnosis not present

## 2023-10-15 DIAGNOSIS — B351 Tinea unguium: Secondary | ICD-10-CM | POA: Diagnosis not present

## 2023-10-15 DIAGNOSIS — M79675 Pain in left toe(s): Secondary | ICD-10-CM | POA: Diagnosis not present

## 2023-10-15 DIAGNOSIS — G4733 Obstructive sleep apnea (adult) (pediatric): Secondary | ICD-10-CM | POA: Diagnosis not present

## 2023-10-15 NOTE — Progress Notes (Signed)
 Chief Complaint  Patient presents with   Nail Problem    Pt states he is here for his yearly check up for [facility], would like his toenail trimmed and has a question about a device he is using to get dead skin off the bottom of his foot.     Subjective:  58 y.o. male with PMHx of diabetes mellitus, ESRD, peripheral neuropathy past surgical history of BKA RLE, hallux amputation LLE presenting today for routine annual diabetic foot exam   Past Medical History:  Diagnosis Date   Anemia    Arthritis    Cancer (HCC)    Renal Tumor   CKD (chronic kidney disease)    Coronary artery disease    Diabetes (HCC)    DM (diabetes mellitus) with complications (HCC)    ESRD (end stage renal disease) (HCC)    Pre- dialysis   History of colonoscopy 2011   Hypertension    Kidney transplanted 2022   Left kidney mass    Neuropathy    Obesity    Osteomyelitis, chronic, ankle or foot (HCC)    PONV (postoperative nausea and vomiting)    Renal insufficiency    Patient is on Dialysis and receives M,W and F.   S/P BKA (below knee amputation) (HCC) 08/2014   Right , Daniel Mcalpine, [place]   Sleep apnea    CPAP   Thyroid  disease    Past Surgical History:  Procedure Laterality Date   BASCILIC VEIN TRANSPOSITION Left 02/17/2015   Procedure: BASCILIC VEIN TRANSPOSITION;  Surgeon: Cordella KANDICE Shawl, MD;  Location: [facility] OR[facility];  Service: Vascular;  Laterality: Left;   BELOW KNEE LEG AMPUTATION Right    CORONARY ANGIOPLASTY WITH STENT PLACEMENT     EYE SURGERY     FOOT SURGERY     Multiple R foot surgery for infection and charcot foot   I & D EXTREMITY Left 04/16/2016   Procedure: IRRIGATION AND DEBRIDEMENT EXTREMITY/GREAT TOE AMP.;  Surgeon: Thresa CHRISTELLA Sar, DPM;  Location: [facility];  Service: Podiatry;  Laterality: Left;   IR ANGIOGRAM FOLLOW UP STUDY  2025   JOINT REPLACEMENT     LAPAROSCOPIC NEPHRECTOMY, HAND ASSISTED Left 07/11/2015   Procedure: HAND ASSISTED LAPAROSCOPIC NEPHRECTOMY;  Surgeon: Rosina Riis, MD;  Location: [facility];  Service: Urology;  Laterality: Left;   PERIPHERAL VASCULAR CATHETERIZATION Left 12/06/2014   Procedure: A/V Shuntogram/Fistulagram;  Surgeon: Cordella KANDICE Shawl, MD;  Location: [facility];  Service: Cardiovascular;  Laterality: Left;   PERIPHERAL VASCULAR CATHETERIZATION Left 12/06/2014   Procedure: A/V Shunt Intervention;  Surgeon: Cordella KANDICE Shawl, MD;  Location: [facility];  Service: Cardiovascular;  Laterality: Left;   TONSILLECTOMY     No Known Allergies   Objective/Physical Exam General: The patient is alert and oriented x3 in no acute distress.  Dermatology:  No open wounds noted.  Hyperkeratotic dystrophic nails noted 2-5 left  Vascular: Palpable pedal pulses left lower extremity.  No edema or erythema  Neurological: Light touch and protective threshold diminished left  Musculoskeletal Exam: PSxHx BKA RLE, hallux amp LLE  Assessment: 1. diabetes mellitus w/ peripheral neuropathy 2. Onychomycosis of toenails left   Plan of Care:  -Patient evaluated.  Comprehensive diabetic exam performed today -Mechanical debridement of nails 2-5 left foot performed using a nail nipper without incident or bleeding.  Smoothed with a rotary bur -Continue to maintain good foot hygiene -Return to clinic annually   Thresa EMERSON Sar, DPM [facility]  Dr. Thresa EMERSON Sar, DPM    [address] [place], [place] [address]                Office [phone]  Fax [phone]

## 2023-10-16 ENCOUNTER — Other Ambulatory Visit (HOSPITAL_COMMUNITY)
Admission: RE | Admit: 2023-10-16 | Discharge: 2023-10-16 | Disposition: A | Discharge status: Home / Self Care | Source: Ambulatory Visit | Attending: Pediatric Nephrology | Admitting: Pediatric Nephrology

## 2023-10-16 DIAGNOSIS — E1129 Type 2 diabetes mellitus with other diabetic kidney complication: Secondary | ICD-10-CM | POA: Insufficient documentation

## 2023-10-16 DIAGNOSIS — N390 Urinary tract infection, site not specified: Secondary | ICD-10-CM | POA: Insufficient documentation

## 2023-10-16 DIAGNOSIS — G4733 Obstructive sleep apnea (adult) (pediatric): Secondary | ICD-10-CM | POA: Diagnosis not present

## 2023-10-16 DIAGNOSIS — Z9483 Pancreas transplant status: Secondary | ICD-10-CM | POA: Insufficient documentation

## 2023-10-16 DIAGNOSIS — B259 Cytomegaloviral disease, unspecified: Secondary | ICD-10-CM | POA: Diagnosis not present

## 2023-10-16 DIAGNOSIS — E559 Vitamin D deficiency, unspecified: Secondary | ICD-10-CM | POA: Diagnosis not present

## 2023-10-16 DIAGNOSIS — D899 Disorder involving the immune mechanism, unspecified: Secondary | ICD-10-CM | POA: Insufficient documentation

## 2023-10-16 DIAGNOSIS — Z789 Other specified health status: Secondary | ICD-10-CM | POA: Diagnosis not present

## 2023-10-16 DIAGNOSIS — T8610 Unspecified complication of kidney transplant: Secondary | ICD-10-CM | POA: Diagnosis not present

## 2023-10-16 DIAGNOSIS — Z940 Kidney transplant status: Secondary | ICD-10-CM | POA: Diagnosis not present

## 2023-10-16 DIAGNOSIS — Z09 Encounter for follow-up examination after completed treatment for conditions other than malignant neoplasm: Secondary | ICD-10-CM | POA: Diagnosis not present

## 2023-10-16 DIAGNOSIS — Z114 Encounter for screening for human immunodeficiency virus [HIV]: Secondary | ICD-10-CM | POA: Diagnosis not present

## 2023-10-16 DIAGNOSIS — D631 Anemia in chronic kidney disease: Secondary | ICD-10-CM | POA: Diagnosis not present

## 2023-10-16 DIAGNOSIS — Z79899 Other long term (current) drug therapy: Secondary | ICD-10-CM | POA: Diagnosis not present

## 2023-10-16 LAB — CBC WITH DIFFERENTIAL/PLATELET
Abs Immature Granulocytes: 0.05 K/uL (ref 0.00–0.07)
Basophils Absolute: 0 K/uL (ref 0.0–0.1)
Basophils Relative: 0 %
Eosinophils Absolute: 0.1 K/uL (ref 0.0–0.5)
Eosinophils Relative: 1 %
HCT: 36.6 % — ABNORMAL LOW (ref 39.0–52.0)
Hemoglobin: 10.5 g/dL — ABNORMAL LOW (ref 13.0–17.0)
Immature Granulocytes: 1 %
Lymphocytes Relative: 9 %
Lymphs Abs: 0.7 K/uL (ref 0.7–4.0)
MCH: 24.5 pg — ABNORMAL LOW (ref 26.0–34.0)
MCHC: 28.7 g/dL — ABNORMAL LOW (ref 30.0–36.0)
MCV: 85.5 fL (ref 80.0–100.0)
Monocytes Absolute: 0.5 K/uL (ref 0.1–1.0)
Monocytes Relative: 7 %
Neutro Abs: 6.1 K/uL (ref 1.7–7.7)
Neutrophils Relative %: 82 %
Platelets: 171 K/uL (ref 150–400)
RBC: 4.28 MIL/uL (ref 4.22–5.81)
RDW: 14.5 % (ref 11.5–15.5)
WBC: 7.4 K/uL (ref 4.0–10.5)
nRBC: 0 % (ref 0.0–0.2)

## 2023-10-16 LAB — URINALYSIS, COMPLETE (UACMP) WITH MICROSCOPIC
Bacteria, UA: NONE SEEN
Bilirubin Urine: NEGATIVE
Glucose, UA: >=500 mg/dL — AB
Hgb urine dipstick: NEGATIVE
Ketones, ur: NEGATIVE mg/dL
Leukocytes,Ua: NEGATIVE
Nitrite: NEGATIVE
Protein, ur: NEGATIVE mg/dL
Specific Gravity, Urine: 1.015 (ref 1.005–1.030)
pH: 6 (ref 5.0–8.0)

## 2023-10-16 LAB — BASIC METABOLIC PANEL WITH GFR
Anion gap: 9 (ref 5–15)
BUN: 51 mg/dL — ABNORMAL HIGH (ref 6–20)
CO2: 21 mmol/L — ABNORMAL LOW (ref 22–32)
Calcium: 9.1 mg/dL (ref 8.9–10.3)
Chloride: 110 mmol/L (ref 98–111)
Creatinine, Ser: 2.4 mg/dL — ABNORMAL HIGH (ref 0.61–1.24)
GFR, Estimated: 31 mL/min — ABNORMAL LOW (ref >60)
Glucose, Bld: 129 mg/dL — ABNORMAL HIGH (ref 70–99)
Potassium: 5.2 mmol/L — ABNORMAL HIGH (ref 3.5–5.1)
Sodium: 140 mmol/L (ref 135–145)

## 2023-10-16 LAB — MAGNESIUM: Magnesium: 1.7 mg/dL (ref 1.7–2.4)

## 2023-10-16 LAB — PHOSPHORUS: Phosphorus: 3.8 mg/dL (ref 2.5–4.6)

## 2023-10-17 LAB — URINE CULTURE: Culture: <10000 — AB

## 2023-10-18 LAB — TACROLIMUS LEVEL: Tacrolimus (FK506) - LabCorp: 6.3 ng/mL (ref 5.0–20.0)

## 2023-10-27 ENCOUNTER — Encounter: Payer: Self-pay | Admitting: Family

## 2023-10-27 ENCOUNTER — Other Ambulatory Visit: Payer: Self-pay | Admitting: Family

## 2023-10-27 ENCOUNTER — Ambulatory Visit: Payer: 59 | Admitting: Family

## 2023-10-27 VITALS — BP 132/84 | HR 71 | Temp 97.8°F | Resp 20 | Ht 68.0 in | Wt 233.0 lb

## 2023-10-27 DIAGNOSIS — Z992 Dependence on renal dialysis: Secondary | ICD-10-CM

## 2023-10-27 DIAGNOSIS — E1140 Type 2 diabetes mellitus with diabetic neuropathy, unspecified: Secondary | ICD-10-CM

## 2023-10-27 DIAGNOSIS — E785 Hyperlipidemia, unspecified: Secondary | ICD-10-CM | POA: Diagnosis not present

## 2023-10-27 DIAGNOSIS — C642 Malignant neoplasm of left kidney, except renal pelvis: Secondary | ICD-10-CM | POA: Diagnosis not present

## 2023-10-27 DIAGNOSIS — Z89511 Acquired absence of right leg below knee: Secondary | ICD-10-CM | POA: Diagnosis not present

## 2023-10-27 DIAGNOSIS — E039 Hypothyroidism, unspecified: Secondary | ICD-10-CM

## 2023-10-27 DIAGNOSIS — N186 End stage renal disease: Secondary | ICD-10-CM

## 2023-10-27 DIAGNOSIS — Z794 Long term (current) use of insulin: Secondary | ICD-10-CM | POA: Diagnosis not present

## 2023-10-27 DIAGNOSIS — I10 Essential (primary) hypertension: Secondary | ICD-10-CM

## 2023-10-27 DIAGNOSIS — Z6839 Body mass index (BMI) 39.0-39.9, adult: Secondary | ICD-10-CM

## 2023-10-27 DIAGNOSIS — E66812 Obesity, class 2: Secondary | ICD-10-CM

## 2023-10-27 MED ORDER — ACCU-CHEK GUIDE TEST VI STRP
ORAL_STRIP | 12 refills | Status: DC
Start: 2023-10-27 — End: 2023-10-27

## 2023-10-27 MED ORDER — OZEMPIC (2 MG/DOSE) 8 MG/3ML ~~LOC~~ SOPN: 2.0000 mg | PEN_INJECTOR | SUBCUTANEOUS | 3 refills | Status: AC

## 2023-10-27 MED ORDER — ACCU-CHEK GUIDE ME W/DEVICE KIT
1.0000 | PACK | Freq: Three times a day (TID) | 5 refills | Status: DC
Start: 2023-10-27 — End: 2023-11-14

## 2023-10-27 MED ORDER — ACCU-CHEK SOFTCLIX LANCET DEV KIT: 1.0000 | PACK | Freq: Three times a day (TID) | 5 refills | Status: DC

## 2023-10-28 LAB — CBC WITH DIFFERENTIAL/PLATELET
Absolute Lymphocytes: 374 {cells}/uL — ABNORMAL LOW (ref 850–3900)
Absolute Monocytes: 524 {cells}/uL (ref 200–950)
Basophils Absolute: 9 {cells}/uL (ref 0–200)
Basophils Relative: 0.2 %
Eosinophils Absolute: 48 {cells}/uL (ref 15–500)
Eosinophils Relative: 1.1 %
HCT: 35.5 % — ABNORMAL LOW (ref 38.5–50.0)
Hemoglobin: 10.5 g/dL — ABNORMAL LOW (ref 13.2–17.1)
MCH: 24.9 pg — ABNORMAL LOW (ref 27.0–33.0)
MCHC: 29.6 g/dL — ABNORMAL LOW (ref 32.0–36.0)
MCV: 84.1 fL (ref 80.0–100.0)
MPV: 11 fL (ref 7.5–12.5)
Monocytes Relative: 11.9 %
Neutro Abs: 3445 {cells}/uL (ref 1500–7800)
Neutrophils Relative %: 78.3 %
Platelets: 136 Thousand/uL — ABNORMAL LOW (ref 140–400)
RBC: 4.22 Million/uL (ref 4.20–5.80)
RDW: 13.5 % (ref 11.0–15.0)
Total Lymphocyte: 8.5 %
WBC: 4.4 Thousand/uL (ref 3.8–10.8)

## 2023-10-28 LAB — COMPLETE METABOLIC PANEL WITHOUT GFR
AG Ratio: 2.3 (calc) (ref 1.0–2.5)
ALT: 17 U/L (ref 9–46)
AST: 17 U/L (ref 10–35)
Albumin: 4.1 g/dL (ref 3.6–5.1)
Alkaline phosphatase (APISO): 86 U/L (ref 35–144)
BUN/Creatinine Ratio: 19 (calc) (ref 6–22)
BUN: 51 mg/dL — ABNORMAL HIGH (ref 7–25)
CO2: 25 mmol/L (ref 20–32)
Calcium: 8.7 mg/dL (ref 8.6–10.3)
Chloride: 105 mmol/L (ref 98–110)
Creat: 2.63 mg/dL — ABNORMAL HIGH (ref 0.70–1.30)
Globulin: 1.8 g/dL — ABNORMAL LOW (ref 1.9–3.7)
Glucose, Bld: 123 mg/dL — ABNORMAL HIGH (ref 65–99)
Potassium: 5.3 mmol/L (ref 3.5–5.3)
Sodium: 140 mmol/L (ref 135–146)
Total Bilirubin: 0.6 mg/dL (ref 0.2–1.2)
Total Protein: 5.9 g/dL — ABNORMAL LOW (ref 6.1–8.1)

## 2023-10-28 LAB — HEMOGLOBIN A1C
Hgb A1c MFr Bld: 6.5 % — ABNORMAL HIGH (ref <5.7)
Mean Plasma Glucose: 140 mg/dL
eAG (mmol/L): 7.7 mmol/L

## 2023-10-28 LAB — LIPID PANEL
Cholesterol: 52 mg/dL (ref <200)
HDL: 22 mg/dL — ABNORMAL LOW (ref >=40)
LDL Cholesterol (Calc): 12 mg/dL
Non-HDL Cholesterol (Calc): 30 mg/dL (ref <130)
Total CHOL/HDL Ratio: 2.4 (calc) (ref <5.0)
Triglycerides: 92 mg/dL (ref <150)

## 2023-10-28 LAB — TSH: TSH: 1.54 m[IU]/L (ref 0.40–4.50)

## 2023-10-30 ENCOUNTER — Ambulatory Visit: Payer: Self-pay | Admitting: Family

## 2023-10-31 DIAGNOSIS — Z940 Kidney transplant status: Principal | ICD-10-CM

## 2023-11-02 NOTE — Progress Notes (Signed)
 Provider: Roxan Plough FNP-C   Tim Becker, Tim BROCKS, NP  Patient Care Team: Tim Becker, Tim BROCKS, NP as PCP - General (Family Medicine) Tim Slain, MD as PCP - Cardiology (Cardiology) Tim Jerona GAILS, MD as Consulting Physician (Orthopedic Surgery) Tim Becker, DPM as Consulting Physician (Podiatry) [facility]  Extended Emergency Contact Information Primary Emergency Contact: Tim Becker Address: [address] [place], [place] [address] [place] Home Phone: [phone] Mobile Phone: [phone] Relation: Spouse  Code Status:  Full Code  Goals of care: Advanced Directive information    10/27/2023    9:02 AM  Advanced Directives  Does Patient Have a Medical Advance Directive? No  Would patient like information on creating a medical advance directive? No - Patient declined     Chief Complaint  Patient presents with   Medical Management of Chronic Issues    6 month follow up     Discussed the use of AI scribe software for clinical note transcription with the patient, who gave verbal consent to proceed.  History of Present Illness   Tim Becker is a 58 year old male who presents for a six-month follow-up.  He has experienced symptoms of an acute febrile illness over the past week, including fever and cough. He managed congestion with generic Alka-Seltzer cold tablets and leftover Mucinex . His fever ranged from 98.47F to 100.47F but has since decreased. He took ibuprofen twice due to lack of Tylenol . No recent COVID-19 testing was performed, and he does not believe he had it.  He is currently on multiple medications for chronic conditions, including amlodipine  10 mg, carvedilol  7.5 mg twice daily, aspirin  81 mg, Synthroid  75 mcg, atorvastatin  40 mg, chlorthalidone 25 mg, furosemide 80 mg as needed, hydralazine  100 mg twice daily, allopurinol  100 mg, mycophenolate  540 mg twice daily, spironolactone 50 mg daily,  semaglutide  1 mg injection, gabapentin 100 mg twice daily, and potassium citrate 15 mEq twice daily. He is not currently taking vitamin D or Bactroban  ointment.  He reports some swelling in his legs. He has not used furosemide recently. His blood pressure is managed with amlodipine  and carvedilol .  He manages his diabetes with Lantus  35 units daily and Humalog  3-17 units twice daily as needed. His blood sugar levels range from 130 to 170 mg/dL, with higher readings sometimes occurring in the morning before eating. He is using semaglutide  without issues. He needs a new prescription for an Accu-Chek glucometer as his insurance no longer covers OneTouch.  He experiences anxiety and depression related to job searching and potential loss of benefits. He has not worked in 15 years and is concerned about finding employment at his age. He is not currently taking medication for these symptoms and is managing them on his own.  He has a history of kidney issues and is monitored by a nephrologist every three to four months. He recently completed a 24-hour urine collection with no reported issues. He reports adequate urine output with no blockages.    Past Medical History:  Diagnosis Date   Anemia    Arthritis    Cancer (HCC)    Renal Tumor   CKD (chronic kidney disease)    Coronary artery disease    Diabetes (HCC)    DM (diabetes mellitus) with complications (HCC)    ESRD (end stage renal disease) (HCC)    Pre- dialysis   History of colonoscopy 2011   Hypertension  Kidney transplanted 2022   Left kidney mass    Neuropathy    Obesity    Osteomyelitis, chronic, ankle or foot (HCC)    PONV (postoperative nausea and vomiting)    Renal insufficiency    Patient is on Dialysis and receives M,W and F.   S/P BKA (below knee amputation) (HCC) 08/2014   Right , Daniel Mcalpine, [place]   Sleep apnea    CPAP   Thyroid  disease    Past Surgical History:  Procedure Laterality Date   BASCILIC VEIN  TRANSPOSITION Left 02/17/2015   Procedure: BASCILIC VEIN TRANSPOSITION;  Surgeon: Cordella KANDICE Shawl, MD;  Location: [facility] ORS;  Service: Vascular;  Laterality: Left;   BELOW KNEE LEG AMPUTATION Right    CORONARY ANGIOPLASTY WITH STENT PLACEMENT     EYE SURGERY     FOOT SURGERY     Multiple R foot surgery for infection and charcot foot   I & D EXTREMITY Left 04/16/2016   Procedure: IRRIGATION AND DEBRIDEMENT EXTREMITY/GREAT TOE AMP.;  Surgeon: Thresa CHRISTELLA Sar, DPM;  Location: [facility] OR;  Service: Podiatry;  Laterality: Left;   IR ANGIOGRAM FOLLOW UP STUDY  2025   JOINT REPLACEMENT     LAPAROSCOPIC NEPHRECTOMY, HAND ASSISTED Left 07/11/2015   Procedure: HAND ASSISTED LAPAROSCOPIC NEPHRECTOMY;  Surgeon: Rosina Riis, MD;  Location: [facility] ORS;  Service: Urology;  Laterality: Left;   PERIPHERAL VASCULAR CATHETERIZATION Left 12/06/2014   Procedure: A/V Shuntogram/Fistulagram;  Surgeon: Cordella KANDICE Shawl, MD;  Location: [facility] INVASIVE [facility];  Service: Cardiovascular;  Laterality: Left;   PERIPHERAL VASCULAR CATHETERIZATION Left 12/06/2014   Procedure: A/V Shunt Intervention;  Surgeon: Cordella KANDICE Shawl, MD;  Location: [facility];  Service: Cardiovascular;  Laterality: Left;   TONSILLECTOMY      No Known Allergies  Allergies as of 10/27/2023   No Known Allergies      Medication List        Accurate as of October 27, 2023 11:59 PM. If you have any questions, ask your nurse or doctor.          STOP taking these medications    cholecalciferol 25 MCG (1000 UNIT) tablet Commonly known as: VITAMIN D3 Stopped by: [name]   mupirocin  ointment 2 % Commonly known as: BACTROBAN  Stopped by: [name]   Ozempic  (1 MG/DOSE) 4 MG/3ML Sopn Generic drug: Semaglutide  (1 MG/DOSE) Replaced by: Ozempic  (2 MG/DOSE) 8 MG/3ML Sopn Stopped by: [name]       TAKE these medications    Accu-Chek Guide Me w/Device Kit 1 Device by Does not apply route 3 (three) times  daily. What changed:  how much to take how to take this when to take this additional instructions Another medication with the same name was removed. Continue taking this medication, and follow the directions you see here. Changed by: Ruthell Feigenbaum C Anastashia Westerfeld   Accu-Chek Softclix Lancet Dev Kit 1 Device by Does not apply route 3 (three) times daily. Started by: Tim Becker Avery Klingbeil   Accu-Chek Softclix Lancets lancets Test blood sugar 3 times a day. DX: E11.40   acetaminophen  500 MG tablet Commonly known as: TYLENOL  Take 500 mg by mouth every 6 (six) hours as needed for moderate pain or mild pain.   allopurinol  100 MG tablet Commonly known as: ZYLOPRIM  Take 100 mg by mouth every other day.   amLODipine  10 MG tablet Commonly known as: NORVASC  Take 10 mg by mouth in the morning. 9am   aspirin  EC 81  MG tablet Take 81 mg by mouth daily. Swallow whole.   atorvastatin  40 MG tablet Commonly known as: LIPITOR Take 40 mg by mouth daily. 9pm.   BD Pen Needle Nano 2nd Gen 32G X 4 MM Misc Generic drug: Insulin  Pen Needle Use to test blood sugar four times daily. Dx:E11.40   carvedilol  12.5 MG tablet Commonly known as: COREG  Take 37.5 mg by mouth in the morning, at noon, and at bedtime. 9am , and 9pm What changed: when to take this   chlorthalidone 25 MG tablet Commonly known as: HYGROTON Take 25 mg by mouth daily.   ergocalciferol 1.25 MG (50000 UT) capsule Commonly known as: VITAMIN D2 Take 50,000 Units by mouth once a week.   furosemide 80 MG tablet Commonly known as: LASIX Take 80 mg by mouth as needed for edema or fluid.   gabapentin 100 MG capsule Commonly known as: NEURONTIN Take 100 mg by mouth 2 (two) times daily.   glucose blood test strip 1 each by Other route 3 (three) times daily. Use as instructed What changed: Another medication with the same name was changed. Make sure you understand how and when to take each. Changed by: Danie Hannig C Roniesha Hollingshead   Accu-Chek Guide Test  test strip Generic drug: glucose blood Use to test blood sugar up to three times daily. Dx: E11.40 What changed:  how much to take how to take this when to take this reasons to take this additional instructions Changed by: Dandra Shambaugh C Kenli Waldo   hydrALAZINE  25 MG tablet Commonly known as: APRESOLINE  Take 100 mg by mouth in the morning and at bedtime.   insulin  lispro 100 UNIT/ML KwikPen Commonly known as: HUMALOG  INJECT 3-17 UNITS INTO THE SKIN 2 TIMES PER DAY AS NEEDED FOR HIGH BLOOD SUGAR   Jardiance 10 MG Tabs tablet Generic drug: empagliflozin Take 10 mg by mouth daily.   Lantus  SoloStar 100 UNIT/ML Solostar Pen Generic drug: insulin  glargine Inject 35 Units into the skin daily. INJECT 35 UNITS SUBCUTANEOUSLY ONCE DAILY BEFORE BREAKFAST   levothyroxine  75 MCG tablet Commonly known as: SYNTHROID  Take 75 mcg by mouth daily before breakfast. 7am   mycophenolate  180 MG EC tablet Commonly known as: MYFORTIC  Take 3 tablets (540 mg total) by mouth in the morning and at bedtime. 3 tablets (540 mg total per dose) at 9am and 9pm   Ozempic  (2 MG/DOSE) 8 MG/3ML Sopn Generic drug: Semaglutide  (2 MG/DOSE) Inject 2 mg into the skin once a week. Start taking on: November 27, 2023 Replaces: Ozempic  (1 MG/DOSE) 4 MG/3ML Sopn Started by: Scarleth Brame C Tiran Sauseda   Potassium Citrate 15 MEQ (1620 MG) Tbcr Take 15 mEq by mouth 2 (two) times daily.   saline Gel Place 1 Application into both nostrils every 4 (four) hours.   spironolactone 25 MG tablet Commonly known as: ALDACTONE Take 50 mg by mouth every morning.   tacrolimus  ER 1 MG Tb24 Commonly known as: ENVARSUS  XR Take 5 mg by mouth daily before breakfast.   triamcinolone  cream 0.1 % Commonly known as: KENALOG  Apply 1 Application topically 2 (two) times daily. Apply to affected areas on abdomen        Review of Systems  Constitutional:  Negative for appetite change, chills, fatigue, fever and unexpected weight change.  HENT:   Negative for congestion, dental problem, ear discharge, ear pain, facial swelling, hearing loss, nosebleeds, postnasal drip, rhinorrhea, sinus pressure, sinus pain, sneezing, sore throat, tinnitus and trouble swallowing.   Eyes:  Negative for pain, discharge,  redness, itching and visual disturbance.  Respiratory:  Negative for cough, chest tightness, shortness of breath and wheezing.   Cardiovascular:  Negative for chest pain, palpitations and leg swelling.  Gastrointestinal:  Negative for abdominal distention, abdominal pain, blood in stool, constipation, diarrhea, nausea and vomiting.  Endocrine: Negative for cold intolerance, heat intolerance, polydipsia, polyphagia and polyuria.  Genitourinary:  Negative for difficulty urinating, dysuria, flank pain, frequency and urgency.  Musculoskeletal:  Negative for arthralgias, back pain, gait problem, joint swelling, myalgias, neck pain and neck stiffness.  Skin:  Negative for color change, pallor, rash and wound.  Neurological:  Negative for dizziness, syncope, speech difficulty, weakness, light-headedness, numbness and headaches.  Hematological:  Does not bruise/bleed easily.  Psychiatric/Behavioral:  Negative for agitation, behavioral problems, confusion, hallucinations, self-injury, sleep disturbance and suicidal ideas. The patient is nervous/anxious.     Immunization History  Administered Date(s) Administered   Fluzone Influenza virus vaccine,trivalent (IIV3), split virus 01/31/2020, 04/26/2021   Hepatitis B, ADULT 12/30/2014, 01/27/2015, 03/01/2015, 06/30/2015   Influenza Split 07/31/2011   Influenza, Seasonal, Injecte, Preservative Fre 12/25/2022   Influenza,inj,Quad PF,6+ Mos 01/19/2020, 12/27/2020, 04/26/2021, 01/16/2022   Influenza-Unspecified 01/19/2020   Moderna Covid-19 Fall Seasonal Vaccine 19yrs & older 04/19/2022   PFIZER(Purple Top)SARS-COV-2 Vaccination 06/11/2019, 07/07/2019, 12/31/2019, 01/31/2020, 08/31/2020, 10/11/2020    PNEUMOCOCCAL CONJUGATE-20 01/16/2022   Pfizer Covid-19 Vaccine Bivalent Booster 27yrs & up 04/26/2021, 11/30/2021   Pfizer(Comirnaty)Fall Seasonal Vaccine 12 years and older 12/25/2022   Pneumococcal Polysaccharide-23 05/22/2015   Tdap 03/01/2017   Unspecified SARS-COV-2 Vaccination 05/23/2023   Pertinent  Health Maintenance Due  Topic Date Due   INFLUENZA VACCINE  10/31/2023   FOOT EXAM  02/16/2024 (Originally 05/02/1975)   HEMOGLOBIN A1C  04/28/2024   OPHTHALMOLOGY EXAM  06/17/2024   Colonoscopy  11/14/2031      01/09/2023    9:16 AM 03/11/2023    1:41 PM 04/28/2023    9:04 AM 07/29/2023    9:48 AM 10/27/2023    9:02 AM  Fall Risk  Falls in the past year? 0 0 0 0 0  Was there an injury with Fall?  0 0 0 0  Fall Risk Category Calculator  0 0 0 0  Patient at Risk for Falls Due to    No Fall Risks No Fall Risks  Fall risk Follow up    Falls evaluation completed Falls evaluation completed   Functional Status Survey:    Vitals:   10/27/23 0911  BP: 132/84  Pulse: 71  Resp: 20  Temp: 97.8 F (36.6 C)  SpO2: 96%  Weight: 233 lb (105.7 kg)  Height: 5' 8 (1.727 m)   Body mass index is 35.43 kg/m. Physical Exam  VITALS: T- 97.8, P- 71, BP- 132/84, SaO2- 96% GENERAL: Alert, cooperative, well developed, no acute distress. HEENT: Normocephalic, normal oropharynx, moist mucous membranes, ears normal bilaterally, nose normal. CHEST: Clear to auscultation bilaterally, no wheezes, rhonchi, or crackles. CARDIOVASCULAR: Normal heart rate and rhythm, S1 and S2 normal without murmurs. ABDOMEN: Soft, non-tender, non-distended, without organomegaly, normal bowel sounds. EXTREMITIES: No cyanosis or edema, no extremity swelling. MUSCULOSKELETAL: No spinal tenderness. NEUROLOGICAL: Cranial nerves grossly intact, moves all extremities without gross motor or sensory deficit. SKIN: Mole on left leg unchanged.      Labs reviewed: Recent Labs    07/14/23 0950 08/06/23 0951  10/16/23 0955 10/27/23 0943  NA 139 141 140 140  K 4.7 4.7 5.2* 5.3  CL 109 114* 110 105  CO2 22 18* 21* 25  GLUCOSE  142* 125* 129* 123*  BUN 54* 51* 51* 51*  CREATININE 2.85* 2.20* 2.40* 2.63*  CALCIUM  9.5 9.0 9.1 8.7  MG 1.8 2.0 1.7  --   PHOS 4.1 4.3 3.8  --    Recent Labs    12/03/22 1338 04/28/23 0955 10/27/23 0943  AST 9* 14 17  ALT 8* 13 17  BILITOT 0.6 0.5 0.6  PROT 6.3 6.5 5.9*   Recent Labs    08/06/23 0951 10/16/23 0955 10/27/23 0943  WBC 8.1 7.4 4.4  NEUTROABS 6.3 6.1 3,445  HGB 10.7* 10.5* 10.5*  HCT 38.0* 36.6* 35.5*  MCV 87.4 85.5 84.1  PLT 180 171 136*   Lab Results  Component Value Date   TSH 1.54 10/27/2023   Lab Results  Component Value Date   HGBA1C 6.5 (H) 10/27/2023   Lab Results  Component Value Date   CHOL 52 10/27/2023   HDL 22 (L) 10/27/2023   LDLCALC 12 10/27/2023   TRIG 92 10/27/2023   CHOLHDL 2.4 10/27/2023    Significant Diagnostic Results in last 30 days:  No results found.  Assessment/Plan  Acute febrile illness Recent fever ranging from 98.71F to 100.71F, now resolved. Experienced coughing and congestion, self-medicated with Alka-Seltzer cold tablets and Mucinex . Took ibuprofen despite contraindications. Current temperature is 97.61F. No COVID-19 test performed, does not suspect COVID-19. Appetite variable, managed a full meal recently.  Diabetes Mellitus On Lantus , Humalog , and Ozempic . Blood glucose levels range from 130 to 170 mg/dL, with 829 mg/dL too high for fasting. Hesitant to increase Ozempic  dose due to recent prescription refill and insurance coverage concerns. [employer] covers current Ozempic  dose. - Prescribe Accu-Chek glucometer and supplies - Monitor blood glucose levels closely, especially after starting higher Ozempic  dose - Adjust insulin  if blood glucose levels fall below 75 mg/dL  Hypertension On multiple antihypertensive medications including amlodipine , carvedilol , chlorthalidone,  hydralazine , and spironolactone. Blood pressure well-controlled at 132/84 mmHg.  Chronic kidney disease Under nephrology care, dialysis site functioning well. Regular follow-ups every three to four months. Recent 24-hour urine collection showed no issues. - Continue regular nephrology follow-ups  Hyperlipidemia On atorvastatin  40 mg with no reported issues. Lipid levels to be rechecked. - Order lipid panel  Hypothyroidism On levothyroxine  75 mcg. Thyroid  function tests to be rechecked. - Order thyroid  function tests  Gout On allopurinol  100 mg.  Depression and anxiety Feeling overwhelmed and anxious about job prospects and potential loss of benefits. Declines pharmacological treatment, managing without medication.  General Health Maintenance Has not seen a dentist this year, advised to have at least an annual cleaning. Saw an eye doctor earlier this year with no changes in vision reported. - Encourage dental visit for cleaning - Order CBC and chemistry panel  Follow-up Advised to follow up in six months for a full appointment after completing lab work. - Schedule follow-up appointment in six months - Complete lab work including A1c, thyroid , cholesterol, CBC, and chemistry panel    Family/ staff Communication: Reviewed plan of care with patient verbalized understanding   Labs/tests ordered:  - CBC with Differential/Platelet - CMP with eGFR([facility]) - TSH - Hgb A1C - Lipid panel   Next Appointment : Return in about 6 months (around 04/28/2024) for medical mangement of chronic issues.SABRA   Spent 30 minutes of Face to face and non-face to face with patient  >50% time spent counseling; reviewing medical record; tests; labs; documentation and developing future plan of care.   Tim JAYSON Plough, NP

## 2023-11-03 DIAGNOSIS — Z940 Kidney transplant status: Principal | ICD-10-CM

## 2023-11-03 MED ORDER — ALLOPURINOL 100 MG TABLET
ORAL_TABLET | ORAL | 3 refills | 90.00000 days
Start: 2023-11-03 — End: 2024-11-02

## 2023-11-04 ENCOUNTER — Other Ambulatory Visit: Payer: Self-pay

## 2023-11-04 ENCOUNTER — Emergency Department (HOSPITAL_COMMUNITY)
Admission: EM | Admit: 2023-11-04 | Discharge: 2023-11-04 | Disposition: A | Discharge status: Home / Self Care | Attending: Emergency Medicine | Admitting: Emergency Medicine

## 2023-11-04 ENCOUNTER — Emergency Department (HOSPITAL_COMMUNITY)

## 2023-11-04 ENCOUNTER — Encounter (HOSPITAL_COMMUNITY): Payer: Self-pay | Admitting: *Deleted

## 2023-11-04 DIAGNOSIS — Z940 Kidney transplant status: Principal | ICD-10-CM

## 2023-11-04 DIAGNOSIS — N184 Chronic kidney disease, stage 4 (severe): Secondary | ICD-10-CM | POA: Insufficient documentation

## 2023-11-04 DIAGNOSIS — D631 Anemia in chronic kidney disease: Secondary | ICD-10-CM | POA: Insufficient documentation

## 2023-11-04 DIAGNOSIS — Z794 Long term (current) use of insulin: Secondary | ICD-10-CM | POA: Insufficient documentation

## 2023-11-04 DIAGNOSIS — U071 COVID-19: Secondary | ICD-10-CM | POA: Insufficient documentation

## 2023-11-04 DIAGNOSIS — Z7982 Long term (current) use of aspirin: Secondary | ICD-10-CM | POA: Diagnosis not present

## 2023-11-04 DIAGNOSIS — R9389 Abnormal findings on diagnostic imaging of other specified body structures: Secondary | ICD-10-CM | POA: Diagnosis not present

## 2023-11-04 DIAGNOSIS — R059 Cough, unspecified: Secondary | ICD-10-CM | POA: Diagnosis not present

## 2023-11-04 DIAGNOSIS — E1122 Type 2 diabetes mellitus with diabetic chronic kidney disease: Secondary | ICD-10-CM | POA: Insufficient documentation

## 2023-11-04 DIAGNOSIS — E860 Dehydration: Secondary | ICD-10-CM | POA: Insufficient documentation

## 2023-11-04 LAB — COMPREHENSIVE METABOLIC PANEL WITH GFR
ALT: 76 U/L — ABNORMAL HIGH (ref 0–44)
AST: 40 U/L (ref 15–41)
Albumin: 3.7 g/dL (ref 3.5–5.0)
Alkaline Phosphatase: 99 U/L (ref 38–126)
Anion gap: 11 (ref 5–15)
BUN: 62 mg/dL — ABNORMAL HIGH (ref 6–20)
CO2: 18 mmol/L — ABNORMAL LOW (ref 22–32)
Calcium: 8.7 mg/dL — ABNORMAL LOW (ref 8.9–10.3)
Chloride: 105 mmol/L (ref 98–111)
Creatinine, Ser: 3.13 mg/dL — ABNORMAL HIGH (ref 0.61–1.24)
GFR, Estimated: 22 mL/min — ABNORMAL LOW (ref >60)
Glucose, Bld: 128 mg/dL — ABNORMAL HIGH (ref 70–99)
Potassium: 5.2 mmol/L — ABNORMAL HIGH (ref 3.5–5.1)
Sodium: 134 mmol/L — ABNORMAL LOW (ref 135–145)
Total Bilirubin: 0.8 mg/dL (ref 0.0–1.2)
Total Protein: 6.9 g/dL (ref 6.5–8.1)

## 2023-11-04 LAB — CBC WITH DIFFERENTIAL/PLATELET
Abs Immature Granulocytes: 0.05 K/uL (ref 0.00–0.07)
Basophils Absolute: 0 K/uL (ref 0.0–0.1)
Basophils Relative: 0 %
Eosinophils Absolute: 0 K/uL (ref 0.0–0.5)
Eosinophils Relative: 1 %
HCT: 36.2 % — ABNORMAL LOW (ref 39.0–52.0)
Hemoglobin: 10.5 g/dL — ABNORMAL LOW (ref 13.0–17.0)
Immature Granulocytes: 2 %
Lymphocytes Relative: 10 %
Lymphs Abs: 0.3 K/uL — ABNORMAL LOW (ref 0.7–4.0)
MCH: 24.3 pg — ABNORMAL LOW (ref 26.0–34.0)
MCHC: 29 g/dL — ABNORMAL LOW (ref 30.0–36.0)
MCV: 83.8 fL (ref 80.0–100.0)
Monocytes Absolute: 0.5 K/uL (ref 0.1–1.0)
Monocytes Relative: 16 %
Neutro Abs: 2.2 K/uL (ref 1.7–7.7)
Neutrophils Relative %: 71 %
Platelets: 185 K/uL (ref 150–400)
RBC: 4.32 MIL/uL (ref 4.22–5.81)
RDW: 14 % (ref 11.5–15.5)
WBC: 3.1 K/uL — ABNORMAL LOW (ref 4.0–10.5)
nRBC: 0 % (ref 0.0–0.2)

## 2023-11-04 LAB — RESP PANEL BY RT-PCR (RSV, FLU A&B, COVID)  RVPGX2
Influenza A by PCR: NEGATIVE
Influenza B by PCR: NEGATIVE
Resp Syncytial Virus by PCR: NEGATIVE
SARS Coronavirus 2 by RT PCR: POSITIVE — AB

## 2023-11-04 LAB — LIPASE, BLOOD: Lipase: 37 U/L (ref 11–51)

## 2023-11-04 MED ORDER — ALLOPURINOL 100 MG TABLET
ORAL_TABLET | ORAL | 3 refills | 90.00000 days | Status: CP
Start: 2023-11-04 — End: 2024-11-03
  Filled 2023-11-05: qty 45, 90d supply, fill #0

## 2023-11-04 MED ORDER — LACTATED RINGERS IV BOLUS
1000.0000 mL | Freq: Once | INTRAVENOUS | Status: AC
  Administered 2023-11-04: 1000 mL via INTRAVENOUS

## 2023-11-04 MED ORDER — MOLNUPIRAVIR 200 MG PO CAPS: 4.0000 | ORAL_CAPSULE | Freq: Two times a day (BID) | ORAL | 0 refills | Status: AC

## 2023-11-04 MED FILL — ATORVASTATIN 40 MG TABLET: ORAL | 90 days supply | Qty: 90 | Fill #2

## 2023-11-04 NOTE — ED Provider Notes (Signed)
 [place] EMERGENCY DEPARTMENT AT [facility] Provider Note   CSN: [id number] Arrival date & time: 11/04/23  1018     Patient presents with: Illness   Tim Becker is a 58 y.o. male.  Patient with past history significant for type 2 diabetes, CKD stage IV, history of right leg amputation presents ED with concerns of illness.  Reports feeling unwell with cold type symptoms including cough, congestion, and low-grade fevers as well as nausea, loose stools for about 2 weeks.  Denies any hemoptysis, abdominal pain, vomiting, or diarrhea.  No significant sick contacts does report that his wife works at a post office and he may have brought something home with her.   Illness Associated symptoms: cough        Prior to Admission medications   Medication Sig Start Date End Date Taking? Authorizing Provider  molnupiravir  EUA (LAGEVRIO ) 200 MG CAPS capsule Take 4 capsules (800 mg total) by mouth 2 (two) times daily for 5 days. 11/04/23 11/09/23 Yes Saima Monterroso A, PA-C  Accu-Chek Softclix Lancets lancets Test blood sugar 3 times a day. DX: E11.40 08/07/23   Ngetich, Dinah C, NP  acetaminophen  (TYLENOL ) 500 MG tablet Take 500 mg by mouth every 6 (six) hours as needed for moderate pain or mild pain.    [provider]  allopurinol  (ZYLOPRIM ) 100 MG tablet Take 100 mg by mouth every other day.    [provider]  amLODipine  (NORVASC ) 10 MG tablet Take 10 mg by mouth in the morning. 9am 01/12/18   [provider]  aspirin  EC 81 MG tablet Take 81 mg by mouth daily. Swallow whole.    [provider]  atorvastatin  (LIPITOR) 40 MG tablet Take 40 mg by mouth daily. 9pm.    [provider]  Blood Glucose Monitoring Suppl (ACCU-CHEK GUIDE ME) w/Device KIT 1 Device by Does not apply route 3 (three) times daily. 10/27/23   Ngetich, Dinah C, NP  carvedilol  (COREG ) 12.5 MG tablet Take 37.5 mg by mouth in the morning, at noon, and at bedtime. 9am , and  9pm Patient taking differently: Take 37.5 mg by mouth 2 (two) times daily. 9am , and 9pm    [provider]  chlorthalidone (HYGROTON) 25 MG tablet Take 25 mg by mouth daily.    [provider]  empagliflozin (JARDIANCE) 10 MG TABS tablet Take 10 mg by mouth daily.    [provider]  furosemide (LASIX) 80 MG tablet Take 80 mg by mouth as needed for edema or fluid.    [provider]  gabapentin (NEURONTIN) 100 MG capsule Take 100 mg by mouth 2 (two) times daily. 04/08/23 04/07/24  [provider]  glucose blood (ACCU-CHEK GUIDE TEST) test strip Use to test blood sugar up to three times daily. Dx: E11.40 10/27/23   Ngetich, Dinah C, NP  hydrALAZINE  (APRESOLINE ) 25 MG tablet Take 100 mg by mouth in the morning and at bedtime.    [provider]  insulin  glargine (LANTUS  SOLOSTAR) 100 UNIT/ML Solostar Pen Inject 35 Units into the skin daily. INJECT 35 UNITS SUBCUTANEOUSLY ONCE DAILY BEFORE BREAKFAST 04/28/23   Ngetich, Dinah C, NP  insulin  lispro (HUMALOG ) 100 UNIT/ML KwikPen INJECT 3-17 UNITS INTO THE SKIN 2 TIMES PER DAY AS NEEDED FOR HIGH BLOOD SUGAR 06/09/23   Ngetich, Dinah C, NP  Insulin  Pen Needle (BD PEN NEEDLE NANO 2ND GEN) 32G X 4 MM MISC Use to test blood sugar four times daily. Dx:E11.40 07/26/22  Ngetich, Dinah C, NP  Lancets Misc. (ACCU-CHEK SOFTCLIX LANCET DEV) KIT 1 Device by Does not apply route 3 (three) times daily. 10/27/23   Ngetich, Dinah C, NP  levothyroxine  (SYNTHROID ) 75 MCG tablet Take 75 mcg by mouth daily before breakfast. 7am    [provider]  mycophenolate  (MYFORTIC ) 180 MG EC tablet Take 3 tablets (540 mg total) by mouth in the morning and at bedtime. 3 tablets (540 mg total per dose) at 9am and 9pm 03/25/22   Elnora Ip, MD  Potassium Citrate 15 MEQ (1620 MG) TBCR Take 15 mEq by mouth 2 (two) times daily. 09/24/23 09/23/24  [provider]  Semaglutide , 2 MG/DOSE, (OZEMPIC , 2 MG/DOSE,) 8 MG/3ML  SOPN Inject 2 mg into the skin once a week. 11/27/23   Ngetich, Dinah C, NP  spironolactone (ALDACTONE) 25 MG tablet Take 50 mg by mouth every morning.    [provider]  tacrolimus  ER (ENVARSUS  XR) 1 MG TB24 Take 5 mg by mouth daily before breakfast.    [provider]  triamcinolone  cream (KENALOG ) 0.1 % Apply 1 Application topically 2 (two) times daily. Apply to affected areas on abdomen 04/28/23   Ngetich, Dinah C, NP    Allergies: Patient has no known allergies.    Review of Systems  Respiratory:  Positive for cough.   All other systems reviewed and are negative.   Updated Vital Signs BP (!) 151/77 (BP Location: Right Arm)   Pulse 80   Temp 98.9 F (37.2 C) (Oral)   Resp 17   Wt 105.7 kg   SpO2 100%   BMI 35.43 kg/m   Physical Exam Vitals and nursing note reviewed.  Constitutional:      General: He is not in acute distress.    Appearance: He is well-developed.  HENT:     Head: Normocephalic and atraumatic.  Eyes:     Conjunctiva/sclera: Conjunctivae normal.  Cardiovascular:     Rate and Rhythm: Normal rate and regular rhythm.     Heart sounds: No murmur heard. Pulmonary:     Effort: Pulmonary effort is normal. No respiratory distress.     Breath sounds: No wheezing.       Comments: Faint crackles heard on right side lungs Abdominal:     Palpations: Abdomen is soft.     Tenderness: There is no abdominal tenderness.  Musculoskeletal:        General: No swelling.     Cervical back: Neck supple.  Skin:    General: Skin is warm and dry.     Capillary Refill: Capillary refill takes less than 2 seconds.  Neurological:     Mental Status: He is alert.  Psychiatric:        Mood and Affect: Mood normal.     (all labs ordered are listed, but only abnormal results are displayed) Labs Reviewed  RESP PANEL BY RT-PCR (RSV, FLU A&B, COVID)  RVPGX2 - Abnormal; Notable for the following components:      Result Value   SARS Coronavirus 2 by RT PCR  POSITIVE (*)    All other components within normal limits  CBC WITH DIFFERENTIAL/PLATELET - Abnormal; Notable for the following components:   WBC 3.1 (*)    Hemoglobin 10.5 (*)    HCT 36.2 (*)    MCH 24.3 (*)    MCHC 29.0 (*)    Lymphs Abs 0.3 (*)    All other components within normal limits  COMPREHENSIVE METABOLIC PANEL WITH GFR - Abnormal;  Notable for the following components:   Sodium 134 (*)    Potassium 5.2 (*)    CO2 18 (*)    Glucose, Bld 128 (*)    BUN 62 (*)    Creatinine, Ser 3.13 (*)    Calcium  8.7 (*)    ALT 76 (*)    GFR, Estimated 22 (*)    All other components within normal limits  LIPASE, BLOOD    EKG: None  Radiology: DG Chest 2 View Result Date: 11/04/2023 CLINICAL DATA:  Cough EXAM: CHEST - 2 VIEW COMPARISON:  Chest radiograph March 22, 2022 FINDINGS: The heart size and mediastinal contours are within normal limits. Both lungs are clear. No suspicious consolidation. (Previously seen left lung base opacity on comparison exam is resolved). Elevation of left hemidiaphragm. The visualized skeletal structures are unremarkable. IMPRESSION: No active cardiopulmonary disease. Electronically Signed   By: Megan  Zare M.D.   On: 11/04/2023 12:51    Procedures   Medications Ordered in the ED  lactated ringers  bolus 1,000 mL (1,000 mLs Intravenous New Bag/Given 11/04/23 1347)                                    Medical Decision Making Amount and/or Complexity of Data Reviewed Labs: ordered. Radiology: ordered.  Risk Prescription drug management.   This patient presents to the ED for concern of illness, this involves an extensive number of treatment options, and is a complaint that carries with it a high risk of complications and morbidity.  The differential diagnosis includes COVID, viral URI, pneumonia, bronchitis, sepsis   Co morbidities that complicate the patient evaluation  CKD stage IV, anemia, type 2 diabetes, kidney transplant history  Lab  Tests:  I Ordered, and personally interpreted labs.  The pertinent results include: CBC with baseline anemia and slight downtrend in white count at 3.1, CMP shows signs of mild dehydration with GFR 22 and creatinine up to 3.13 but no AKI, respiratory panel positive for COVID-19, lipase unremarkable 37   Imaging Studies ordered:  I ordered imaging studies including chest x-ray I independently visualized and interpreted imaging which showed negative for any acute findings I agree with the radiologist interpretation   Consultations Obtained:  I requested consultation with none,  and discussed lab and imaging findings as well as pertinent plan - they recommend: N/A   Problem List / ED Course / Critical interventions / Medication management  Patient with past history significant for CKD stage IV, anemia, type 2 diabetes, kidney transplant presents ED with concerns of illness.  Reports has been feeling unwell the last 2 weeks with associated cough, fever, nausea, but denies any vomiting, diarrhea, hemoptysis, or other acute concerns.  Denies any sick contacts as far as he is aware. On exam, patient is uncomfortable well-appearing.  He has mild cough that does not appear to be productive.  Auscultation reveals some crackles to the right side.  Left side unremarkable.  No appreciable lower extremity swelling or edema.  Doubt CHF. Patient's workup shows baseline anemia with hemoglobin at 10.5.  Patient was positive for COVID-19.  Dehydration with creatinine up to 3.13 but not at AKI level based on his baseline.  Given lack of AKI patient otherwise stable with unremarkable vitals with exception of mild hypertension, do not feel that he has emergent need for admission at this time. Chest x-ray negative for any acute findings.  Based on symptoms and  findings of workup, will discharge home with a prescription for [facility].  Patient given a liter fluid bolus for mild dehydration prior to discharge.  He is  otherwise stable for outpatient follow-up and discharged home. I ordered medication including fluids for dehydration Reevaluation of the patient after these medicines showed that the patient improved I have reviewed the patients home medicines and have made adjustments as needed   Social Determinants of Health:  Kidney transplant history   Test / Admission - Considered:  Admission considered but patient stable for outpatient follow-up.  Final diagnoses:  COVID-19  Dehydration    ED Discharge Orders          Ordered    molnupiravir  EUA (LAGEVRIO ) 200 MG CAPS capsule  2 times daily        11/04/23 1402               Kameron Blethen A, PA-C 11/04/23 1433    LongFonda MATSU, MD 11/06/23 1108

## 2023-11-04 NOTE — Progress Notes (Signed)
 Noted

## 2023-11-04 NOTE — ED Triage Notes (Signed)
 Here by POV from home for illness, including: cold sx, fever, nausea, cough, loose stool. Sx onset >2 weeks ago. Denies: pain, vomiting, blood/ bleeding, recent travel, recent ABT. Alert, NAD, calm, interactive, resps e/u, steady gait. Tried cold tablets.

## 2023-11-04 NOTE — Discharge Instructions (Signed)
 You were seen in the ER today for concerns of illness. Your labs and imaging show that you currently have COVID-19 and were mildly dehydrated. I have started you on an anti-viral medication called Lagevrio  to help manage your symptoms. Take this as prescribed. For any concerns of new or worsening symptoms, return to the ER.

## 2023-11-06 DIAGNOSIS — Z940 Kidney transplant status: Principal | ICD-10-CM

## 2023-11-11 MED FILL — MYCOPHENOLATE SODIUM 180 MG TABLET,DELAYED RELEASE: ORAL | 30 days supply | Qty: 180 | Fill #10

## 2023-11-11 MED FILL — GABAPENTIN 100 MG CAPSULE: ORAL | 30 days supply | Qty: 60 | Fill #7

## 2023-11-13 ENCOUNTER — Other Ambulatory Visit: Payer: Self-pay

## 2023-11-13 ENCOUNTER — Emergency Department (HOSPITAL_COMMUNITY)

## 2023-11-13 ENCOUNTER — Encounter (HOSPITAL_COMMUNITY): Payer: Self-pay | Admitting: Internal Medicine

## 2023-11-13 ENCOUNTER — Inpatient Hospital Stay (HOSPITAL_COMMUNITY)
Admission: EM | Admit: 2023-11-13 | Discharge: 2023-11-17 | DRG: 698 | Disposition: A | Discharge status: Home / Self Care | Attending: Internal Medicine | Admitting: Internal Medicine

## 2023-11-13 DIAGNOSIS — R112 Nausea with vomiting, unspecified: Secondary | ICD-10-CM | POA: Diagnosis not present

## 2023-11-13 DIAGNOSIS — U071 COVID-19: Secondary | ICD-10-CM | POA: Diagnosis present

## 2023-11-13 DIAGNOSIS — Z794 Long term (current) use of insulin: Secondary | ICD-10-CM | POA: Diagnosis not present

## 2023-11-13 DIAGNOSIS — J159 Unspecified bacterial pneumonia: Secondary | ICD-10-CM | POA: Diagnosis present

## 2023-11-13 DIAGNOSIS — R5381 Other malaise: Secondary | ICD-10-CM | POA: Diagnosis present

## 2023-11-13 DIAGNOSIS — Z79899 Other long term (current) drug therapy: Secondary | ICD-10-CM

## 2023-11-13 DIAGNOSIS — N184 Chronic kidney disease, stage 4 (severe): Secondary | ICD-10-CM | POA: Diagnosis not present

## 2023-11-13 DIAGNOSIS — E872 Acidosis, unspecified: Secondary | ICD-10-CM | POA: Diagnosis not present

## 2023-11-13 DIAGNOSIS — J189 Pneumonia, unspecified organism: Secondary | ICD-10-CM | POA: Diagnosis not present

## 2023-11-13 DIAGNOSIS — N179 Acute kidney failure, unspecified: Secondary | ICD-10-CM | POA: Diagnosis not present

## 2023-11-13 DIAGNOSIS — Z79890 Hormone replacement therapy (postmenopausal): Secondary | ICD-10-CM

## 2023-11-13 DIAGNOSIS — I129 Hypertensive chronic kidney disease with stage 1 through stage 4 chronic kidney disease, or unspecified chronic kidney disease: Secondary | ICD-10-CM | POA: Diagnosis not present

## 2023-11-13 DIAGNOSIS — I10 Essential (primary) hypertension: Secondary | ICD-10-CM | POA: Diagnosis not present

## 2023-11-13 DIAGNOSIS — Y830 Surgical operation with transplant of whole organ as the cause of abnormal reaction of the patient, or of later complication, without mention of misadventure at the time of the procedure: Secondary | ICD-10-CM | POA: Diagnosis present

## 2023-11-13 DIAGNOSIS — Z7984 Long term (current) use of oral hypoglycemic drugs: Secondary | ICD-10-CM

## 2023-11-13 DIAGNOSIS — R197 Diarrhea, unspecified: Secondary | ICD-10-CM | POA: Diagnosis not present

## 2023-11-13 DIAGNOSIS — Z803 Family history of malignant neoplasm of breast: Secondary | ICD-10-CM

## 2023-11-13 DIAGNOSIS — Z841 Family history of disorders of kidney and ureter: Secondary | ICD-10-CM

## 2023-11-13 DIAGNOSIS — Z89511 Acquired absence of right leg below knee: Secondary | ICD-10-CM | POA: Diagnosis not present

## 2023-11-13 DIAGNOSIS — E1122 Type 2 diabetes mellitus with diabetic chronic kidney disease: Secondary | ICD-10-CM | POA: Diagnosis present

## 2023-11-13 DIAGNOSIS — T8619 Other complication of kidney transplant: Principal | ICD-10-CM | POA: Diagnosis present

## 2023-11-13 DIAGNOSIS — E875 Hyperkalemia: Secondary | ICD-10-CM | POA: Diagnosis not present

## 2023-11-13 DIAGNOSIS — D84821 Immunodeficiency due to drugs: Secondary | ICD-10-CM | POA: Diagnosis present

## 2023-11-13 DIAGNOSIS — Z940 Kidney transplant status: Secondary | ICD-10-CM

## 2023-11-13 DIAGNOSIS — Z7985 Long-term (current) use of injectable non-insulin antidiabetic drugs: Secondary | ICD-10-CM

## 2023-11-13 DIAGNOSIS — E039 Hypothyroidism, unspecified: Secondary | ICD-10-CM | POA: Diagnosis not present

## 2023-11-13 DIAGNOSIS — I2510 Atherosclerotic heart disease of native coronary artery without angina pectoris: Secondary | ICD-10-CM | POA: Diagnosis not present

## 2023-11-13 DIAGNOSIS — Z905 Acquired absence of kidney: Secondary | ICD-10-CM

## 2023-11-13 DIAGNOSIS — G4733 Obstructive sleep apnea (adult) (pediatric): Secondary | ICD-10-CM | POA: Diagnosis not present

## 2023-11-13 DIAGNOSIS — Z833 Family history of diabetes mellitus: Secondary | ICD-10-CM

## 2023-11-13 DIAGNOSIS — Z955 Presence of coronary angioplasty implant and graft: Secondary | ICD-10-CM

## 2023-11-13 DIAGNOSIS — Z6835 Body mass index (BMI) 35.0-35.9, adult: Secondary | ICD-10-CM

## 2023-11-13 DIAGNOSIS — E1140 Type 2 diabetes mellitus with diabetic neuropathy, unspecified: Secondary | ICD-10-CM | POA: Diagnosis present

## 2023-11-13 DIAGNOSIS — E860 Dehydration: Secondary | ICD-10-CM | POA: Diagnosis not present

## 2023-11-13 DIAGNOSIS — R059 Cough, unspecified: Secondary | ICD-10-CM | POA: Diagnosis not present

## 2023-11-13 DIAGNOSIS — D638 Anemia in other chronic diseases classified elsewhere: Secondary | ICD-10-CM

## 2023-11-13 DIAGNOSIS — E669 Obesity, unspecified: Secondary | ICD-10-CM | POA: Diagnosis present

## 2023-11-13 DIAGNOSIS — R918 Other nonspecific abnormal finding of lung field: Secondary | ICD-10-CM | POA: Diagnosis not present

## 2023-11-13 DIAGNOSIS — D631 Anemia in chronic kidney disease: Secondary | ICD-10-CM | POA: Diagnosis not present

## 2023-11-13 DIAGNOSIS — Z7982 Long term (current) use of aspirin: Secondary | ICD-10-CM | POA: Diagnosis not present

## 2023-11-13 DIAGNOSIS — N189 Chronic kidney disease, unspecified: Secondary | ICD-10-CM | POA: Diagnosis not present

## 2023-11-13 DIAGNOSIS — Z7960 Long term (current) use of unspecified immunomodulators and immunosuppressants: Secondary | ICD-10-CM

## 2023-11-13 LAB — COMPREHENSIVE METABOLIC PANEL WITH GFR
ALT: 23 U/L (ref 0–44)
AST: 17 U/L (ref 15–41)
Albumin: 3.6 g/dL (ref 3.5–5.0)
Alkaline Phosphatase: 95 U/L (ref 38–126)
Anion gap: 9 (ref 5–15)
BUN: 76 mg/dL — ABNORMAL HIGH (ref 6–20)
CO2: 19 mmol/L — ABNORMAL LOW (ref 22–32)
Calcium: 8.7 mg/dL — ABNORMAL LOW (ref 8.9–10.3)
Chloride: 108 mmol/L (ref 98–111)
Creatinine, Ser: 3.48 mg/dL — ABNORMAL HIGH (ref 0.61–1.24)
GFR, Estimated: 20 mL/min — ABNORMAL LOW (ref >60)
Glucose, Bld: 103 mg/dL — ABNORMAL HIGH (ref 70–99)
Potassium: 5.2 mmol/L — ABNORMAL HIGH (ref 3.5–5.1)
Sodium: 136 mmol/L (ref 135–145)
Total Bilirubin: 0.9 mg/dL (ref 0.0–1.2)
Total Protein: 6.6 g/dL (ref 6.5–8.1)

## 2023-11-13 LAB — CBC WITH DIFFERENTIAL/PLATELET
Abs Immature Granulocytes: 0.06 K/uL (ref 0.00–0.07)
Basophils Absolute: 0 K/uL (ref 0.0–0.1)
Basophils Relative: 0 %
Eosinophils Absolute: 0 K/uL (ref 0.0–0.5)
Eosinophils Relative: 0 %
HCT: 34 % — ABNORMAL LOW (ref 39.0–52.0)
Hemoglobin: 10 g/dL — ABNORMAL LOW (ref 13.0–17.0)
Immature Granulocytes: 1 %
Lymphocytes Relative: 7 %
Lymphs Abs: 0.3 K/uL — ABNORMAL LOW (ref 0.7–4.0)
MCH: 24.8 pg — ABNORMAL LOW (ref 26.0–34.0)
MCHC: 29.4 g/dL — ABNORMAL LOW (ref 30.0–36.0)
MCV: 84.2 fL (ref 80.0–100.0)
Monocytes Absolute: 0.8 K/uL (ref 0.1–1.0)
Monocytes Relative: 17 %
Neutro Abs: 3.4 K/uL (ref 1.7–7.7)
Neutrophils Relative %: 75 %
Platelets: 196 K/uL (ref 150–400)
RBC: 4.04 MIL/uL — ABNORMAL LOW (ref 4.22–5.81)
RDW: 14.2 % (ref 11.5–15.5)
WBC: 4.5 K/uL (ref 4.0–10.5)
nRBC: 0 % (ref 0.0–0.2)

## 2023-11-13 LAB — URINALYSIS, ROUTINE W REFLEX MICROSCOPIC
Bilirubin Urine: NEGATIVE
Glucose, UA: 50 mg/dL — AB
Hgb urine dipstick: NEGATIVE
Ketones, ur: NEGATIVE mg/dL
Leukocytes,Ua: NEGATIVE
Nitrite: NEGATIVE
Protein, ur: NEGATIVE mg/dL
Specific Gravity, Urine: 1.016 (ref 1.005–1.030)
pH: 5 (ref 5.0–8.0)

## 2023-11-13 LAB — I-STAT CG4 LACTIC ACID, ED: Lactic Acid, Venous: 0.6 mmol/L (ref 0.5–1.9)

## 2023-11-13 LAB — GLUCOSE, CAPILLARY: Glucose-Capillary: 110 mg/dL — ABNORMAL HIGH (ref 70–99)

## 2023-11-13 MED ORDER — INSULIN ASPART 100 UNIT/ML IJ SOLN
0.0000 [IU] | Freq: Three times a day (TID) | INTRAMUSCULAR | Status: DC
  Administered 2023-11-15: 3 [IU] via SUBCUTANEOUS
  Administered 2023-11-16: 5 [IU] via SUBCUTANEOUS
  Administered 2023-11-16: 3 [IU] via SUBCUTANEOUS
  Administered 2023-11-16: 5 [IU] via SUBCUTANEOUS
  Administered 2023-11-17: 2 [IU] via SUBCUTANEOUS
  Filled 2023-11-13: qty 0.15

## 2023-11-13 MED ORDER — ONDANSETRON HCL 4 MG PO TABS: 4.0000 mg | ORAL_TABLET | Freq: Four times a day (QID) | ORAL | Status: DC | PRN

## 2023-11-13 MED ORDER — SODIUM CHLORIDE 0.45 % IV SOLN: INTRAVENOUS | Status: DC

## 2023-11-13 MED ORDER — ONDANSETRON HCL 4 MG/2ML IJ SOLN
4.0000 mg | Freq: Once | INTRAMUSCULAR | Status: AC
  Administered 2023-11-13: 4 mg via INTRAVENOUS
  Filled 2023-11-13: qty 2

## 2023-11-13 MED ORDER — SODIUM CHLORIDE 0.9 % IV SOLN
500.0000 mg | Freq: Once | INTRAVENOUS | Status: AC
  Administered 2023-11-13: 500 mg via INTRAVENOUS
  Filled 2023-11-13: qty 5

## 2023-11-13 MED ORDER — LEVOTHYROXINE SODIUM 50 MCG PO TABS
75.0000 ug | ORAL_TABLET | Freq: Every day | ORAL | Status: DC
  Administered 2023-11-14 – 2023-11-17 (×4): 75 ug via ORAL
  Filled 2023-11-13 (×4): qty 1

## 2023-11-13 MED ORDER — HYDRALAZINE HCL 50 MG PO TABS
100.0000 mg | ORAL_TABLET | Freq: Once | ORAL | Status: AC
  Administered 2023-11-13: 100 mg via ORAL
  Filled 2023-11-13: qty 2

## 2023-11-13 MED ORDER — SODIUM ZIRCONIUM CYCLOSILICATE 10 G PO PACK
10.0000 g | PACK | Freq: Once | ORAL | Status: AC
  Administered 2023-11-13: 10 g via ORAL
  Filled 2023-11-13: qty 1

## 2023-11-13 MED ORDER — HYDRALAZINE HCL 25 MG PO TABS
100.0000 mg | ORAL_TABLET | Freq: Two times a day (BID) | ORAL | Status: DC
  Administered 2023-11-14 – 2023-11-17 (×7): 100 mg via ORAL
  Filled 2023-11-13 (×6): qty 4

## 2023-11-13 MED ORDER — HEPARIN SODIUM (PORCINE) 5000 UNIT/ML IJ SOLN
5000.0000 [IU] | Freq: Three times a day (TID) | INTRAMUSCULAR | Status: DC
  Administered 2023-11-13 – 2023-11-14 (×2): 5000 [IU] via SUBCUTANEOUS
  Filled 2023-11-13 (×2): qty 1

## 2023-11-13 MED ORDER — ALBUTEROL SULFATE (2.5 MG/3ML) 0.083% IN NEBU: 2.5000 mg | INHALATION_SOLUTION | RESPIRATORY_TRACT | Status: DC | PRN

## 2023-11-13 MED ORDER — ACETAMINOPHEN 650 MG RE SUPP: 650.0000 mg | Freq: Four times a day (QID) | RECTAL | Status: DC | PRN

## 2023-11-13 MED ORDER — SODIUM CHLORIDE 0.9 % IV BOLUS
500.0000 mL | Freq: Once | INTRAVENOUS | Status: AC
  Administered 2023-11-13: 500 mL via INTRAVENOUS

## 2023-11-13 MED ORDER — ALLOPURINOL 100 MG PO TABS
100.0000 mg | ORAL_TABLET | ORAL | Status: DC
  Administered 2023-11-14 – 2023-11-16 (×2): 100 mg via ORAL
  Filled 2023-11-13 (×4): qty 1

## 2023-11-13 MED ORDER — ONDANSETRON HCL 4 MG/2ML IJ SOLN: 4.0000 mg | Freq: Four times a day (QID) | INTRAMUSCULAR | Status: DC | PRN

## 2023-11-13 MED ORDER — CARVEDILOL 12.5 MG PO TABS
37.5000 mg | ORAL_TABLET | Freq: Two times a day (BID) | ORAL | Status: DC
Start: 2023-11-13 — End: 2023-11-17
  Administered 2023-11-13 – 2023-11-17 (×8): 37.5 mg via ORAL
  Filled 2023-11-13 (×8): qty 3

## 2023-11-13 MED ORDER — SODIUM CHLORIDE 0.9 % IV SOLN: 1.0000 g | Freq: Once | INTRAVENOUS | Status: DC

## 2023-11-13 MED ORDER — ASPIRIN 81 MG PO TBEC
81.0000 mg | DELAYED_RELEASE_TABLET | Freq: Every day | ORAL | Status: DC
  Administered 2023-11-14 – 2023-11-17 (×4): 81 mg via ORAL
  Filled 2023-11-13 (×4): qty 1

## 2023-11-13 MED ORDER — MYCOPHENOLATE SODIUM 180 MG PO TBEC
540.0000 mg | DELAYED_RELEASE_TABLET | Freq: Two times a day (BID) | ORAL | Status: DC
  Administered 2023-11-13 – 2023-11-17 (×8): 540 mg via ORAL
  Filled 2023-11-13 (×9): qty 3

## 2023-11-13 MED ORDER — ACETAMINOPHEN 325 MG PO TABS
650.0000 mg | ORAL_TABLET | Freq: Four times a day (QID) | ORAL | Status: DC | PRN
Start: 2023-11-13 — End: 2023-11-17
  Administered 2023-11-14 – 2023-11-15 (×2): 650 mg via ORAL
  Filled 2023-11-13 (×2): qty 2

## 2023-11-13 MED ORDER — ENSURE PLUS HIGH PROTEIN PO LIQD
237.0000 mL | Freq: Two times a day (BID) | ORAL | Status: DC
  Administered 2023-11-17: 237 mL via ORAL

## 2023-11-13 MED ORDER — INSULIN ASPART 100 UNIT/ML IJ SOLN
0.0000 [IU] | Freq: Every day | INTRAMUSCULAR | Status: DC
  Administered 2023-11-15: 2 [IU] via SUBCUTANEOUS
  Administered 2023-11-16: 4 [IU] via SUBCUTANEOUS
  Filled 2023-11-13: qty 0.05

## 2023-11-13 MED ORDER — LOPERAMIDE HCL 2 MG PO CAPS
4.0000 mg | ORAL_CAPSULE | Freq: Three times a day (TID) | ORAL | Status: DC | PRN
  Administered 2023-11-15: 4 mg via ORAL
  Filled 2023-11-13: qty 2

## 2023-11-13 MED ORDER — INSULIN GLARGINE-YFGN 100 UNIT/ML ~~LOC~~ SOLN
10.0000 [IU] | Freq: Every day | SUBCUTANEOUS | Status: DC
  Administered 2023-11-16: 10 [IU] via SUBCUTANEOUS
  Filled 2023-11-13 (×3): qty 0.1

## 2023-11-13 MED ORDER — SODIUM CHLORIDE 0.9 % IV SOLN
500.0000 mg | INTRAVENOUS | Status: DC
  Administered 2023-11-14 – 2023-11-15 (×2): 500 mg via INTRAVENOUS
  Filled 2023-11-13 (×3): qty 5

## 2023-11-13 MED ORDER — SODIUM CHLORIDE 0.9 % IV SOLN
2.0000 g | INTRAVENOUS | Status: DC
  Administered 2023-11-14 – 2023-11-16 (×3): 2 g via INTRAVENOUS
  Filled 2023-11-13 (×3): qty 20

## 2023-11-13 MED ORDER — SODIUM CHLORIDE 0.9 % IV SOLN
2.0000 g | Freq: Once | INTRAVENOUS | Status: AC
  Administered 2023-11-13: 2 g via INTRAVENOUS
  Filled 2023-11-13: qty 20

## 2023-11-13 MED ORDER — TACROLIMUS ER 4 MG PO TB24: 5.0000 mg | ORAL_TABLET | Freq: Every day | ORAL | Status: DC

## 2023-11-13 MED ORDER — ATORVASTATIN CALCIUM 40 MG PO TABS
40.0000 mg | ORAL_TABLET | Freq: Every day | ORAL | Status: DC
  Administered 2023-11-14 – 2023-11-17 (×4): 40 mg via ORAL
  Filled 2023-11-13 (×4): qty 1

## 2023-11-13 NOTE — ED Notes (Signed)
 Pt can ambulate to the bathroom with no assistance

## 2023-11-13 NOTE — ED Provider Notes (Signed)
 [place] EMERGENCY DEPARTMENT AT [facility] Provider Note   CSN: [id number] Arrival date & time: 11/13/23  1419     Patient presents with: Cough and Generalized Body Aches   Tim Becker is a 58 y.o. male.   58 year old male presents for evaluation of shortness of breath and generalized malaise.  States has been sick for a month.  States he has had some nausea and vomiting as well as fevers chills fatigue and worsening cough.  Was diagnosed with COVID last week but still not feeling better.  Does have a history of kidney transplant has been compliant with his immunosuppressive's.  Denies any other symptoms or concerns at this time.   Cough Associated symptoms: no chest pain, no chills, no ear pain, no fever, no rash, no shortness of breath and no sore throat        Prior to Admission medications   Medication Sig Start Date End Date Taking? Authorizing Provider  Accu-Chek Softclix Lancets lancets Test blood sugar 3 times a day. DX: E11.40 08/07/23   Ngetich, Dinah C, NP  acetaminophen  (TYLENOL ) 500 MG tablet Take 500 mg by mouth every 6 (six) hours as needed for moderate pain or mild pain.    [provider]  allopurinol  (ZYLOPRIM ) 100 MG tablet Take 100 mg by mouth every other day.    [provider]  amLODipine  (NORVASC ) 10 MG tablet Take 10 mg by mouth in the morning. 9am 01/12/18   [provider]  aspirin  EC 81 MG tablet Take 81 mg by mouth daily. Swallow whole.    [provider]  atorvastatin  (LIPITOR) 40 MG tablet Take 40 mg by mouth daily. 9pm.    [provider]  Blood Glucose Monitoring Suppl (ACCU-CHEK GUIDE ME) w/Device KIT 1 Device by Does not apply route 3 (three) times daily. 10/27/23   Ngetich, Dinah C, NP  carvedilol  (COREG ) 12.5 MG tablet Take 37.5 mg by mouth in the morning, at noon, and at bedtime. 9am , and 9pm Patient taking differently: Take 37.5 mg by mouth 2 (two) times daily. 9am , and 9pm     [provider]  chlorthalidone (HYGROTON) 25 MG tablet Take 25 mg by mouth daily.    [provider]  empagliflozin (JARDIANCE) 10 MG TABS tablet Take 10 mg by mouth daily.    [provider]  furosemide (LASIX) 80 MG tablet Take 80 mg by mouth as needed for edema or fluid.    [provider]  gabapentin (NEURONTIN) 100 MG capsule Take 100 mg by mouth 2 (two) times daily. 04/08/23 04/07/24  [provider]  glucose blood (ACCU-CHEK GUIDE TEST) test strip Use to test blood sugar up to three times daily. Dx: E11.40 10/27/23   Ngetich, Dinah C, NP  hydrALAZINE  (APRESOLINE ) 25 MG tablet Take 100 mg by mouth in the morning and at bedtime.    [provider]  insulin  glargine (LANTUS  SOLOSTAR) 100 UNIT/ML Solostar Pen Inject 35 Units into the skin daily. INJECT 35 UNITS SUBCUTANEOUSLY ONCE DAILY BEFORE BREAKFAST 04/28/23   Ngetich, Dinah C, NP  insulin  lispro (HUMALOG ) 100 UNIT/ML KwikPen INJECT 3-17 UNITS INTO THE SKIN 2 TIMES PER DAY AS NEEDED FOR HIGH BLOOD SUGAR 06/09/23   Ngetich, Dinah C, NP  Insulin  Pen Needle (BD PEN NEEDLE NANO 2ND GEN) 32G X 4 MM MISC Use to test blood sugar four times daily. Dx:E11.40 07/26/22   Ngetich, Roxan BROCKS, NP  Lancets Misc. (ACCU-CHEK SOFTCLIX LANCET DEV) KIT  1 Device by Does not apply route 3 (three) times daily. 10/27/23   Ngetich, Dinah C, NP  levothyroxine  (SYNTHROID ) 75 MCG tablet Take 75 mcg by mouth daily before breakfast. 7am    [provider]  mycophenolate  (MYFORTIC ) 180 MG EC tablet Take 3 tablets (540 mg total) by mouth in the morning and at bedtime. 3 tablets (540 mg total per dose) at 9am and 9pm 03/25/22   Elnora Ip, MD  Potassium Citrate 15 MEQ (1620 MG) TBCR Take 15 mEq by mouth 2 (two) times daily. 09/24/23 09/23/24  [provider]  Semaglutide , 2 MG/DOSE, (OZEMPIC , 2 MG/DOSE,) 8 MG/3ML SOPN Inject 2 mg into the skin once a week. 11/27/23   Ngetich, Dinah C, NP  spironolactone  (ALDACTONE) 25 MG tablet Take 50 mg by mouth every morning.    [provider]  tacrolimus  ER (ENVARSUS  XR) 1 MG TB24 Take 5 mg by mouth daily before breakfast.    [provider]  triamcinolone  cream (KENALOG ) 0.1 % Apply 1 Application topically 2 (two) times daily. Apply to affected areas on abdomen 04/28/23   Ngetich, Dinah C, NP    Allergies: Patient has no known allergies.    Review of Systems  Constitutional:  Negative for chills and fever.  HENT:  Negative for ear pain and sore throat.   Eyes:  Negative for pain and visual disturbance.  Respiratory:  Positive for cough. Negative for shortness of breath.   Cardiovascular:  Negative for chest pain and palpitations.  Gastrointestinal:  Negative for abdominal pain and vomiting.  Genitourinary:  Negative for dysuria and hematuria.  Musculoskeletal:  Negative for arthralgias and back pain.  Skin:  Negative for color change and rash.  Neurological:  Negative for seizures and syncope.  All other systems reviewed and are negative.   Updated Vital Signs BP (!) 159/73 (BP Location: Right Arm)   Pulse 94   Temp 100 F (37.8 C)   Resp 20   SpO2 94%   Physical Exam Vitals and nursing note reviewed.  Constitutional:      General: He is not in acute distress.    Appearance: Normal appearance. He is well-developed. He is ill-appearing.  HENT:     Head: Normocephalic and atraumatic.  Eyes:     Conjunctiva/sclera: Conjunctivae normal.  Cardiovascular:     Rate and Rhythm: Normal rate and regular rhythm.     Pulses: Normal pulses.     Heart sounds: Normal heart sounds. No murmur heard. Pulmonary:     Effort: Pulmonary effort is normal. No respiratory distress.     Breath sounds: Rhonchi present.  Abdominal:     Palpations: Abdomen is soft.     Tenderness: There is no abdominal tenderness.  Musculoskeletal:        General: No swelling.     Cervical back: Neck supple.  Skin:    General: Skin is warm and dry.      Capillary Refill: Capillary refill takes less than 2 seconds.  Neurological:     Mental Status: He is alert.  Psychiatric:        Mood and Affect: Mood normal.     (all labs ordered are listed, but only abnormal results are displayed) Labs Reviewed  CBC WITH DIFFERENTIAL/PLATELET - Abnormal; Notable for the following components:      Result Value   RBC 4.04 (*)    Hemoglobin 10.0 (*)    HCT 34.0 (*)    MCH 24.8 (*)  MCHC 29.4 (*)    Lymphs Abs 0.3 (*)    All other components within normal limits  COMPREHENSIVE METABOLIC PANEL WITH GFR - Abnormal; Notable for the following components:   Potassium 5.2 (*)    CO2 19 (*)    Glucose, Bld 103 (*)    BUN 76 (*)    Creatinine, Ser 3.48 (*)    Calcium  8.7 (*)    GFR, Estimated 20 (*)    All other components within normal limits  URINALYSIS, ROUTINE W REFLEX MICROSCOPIC - Abnormal; Notable for the following components:   Glucose, UA 50 (*)    All other components within normal limits  GLUCOSE, CAPILLARY - Abnormal; Notable for the following components:   Glucose-Capillary 110 (*)    All other components within normal limits  CULTURE, BLOOD (ROUTINE X 2)  CULTURE, BLOOD (ROUTINE X 2)  EXPECTORATED SPUTUM ASSESSMENT W GRAM STAIN, RFLX TO RESP C  HIV ANTIBODY (ROUTINE TESTING W REFLEX)  LEGIONELLA PNEUMOPHILA SEROGP 1 UR AG  STREP PNEUMONIAE URINARY ANTIGEN  HEMOGLOBIN A1C  BASIC METABOLIC PANEL WITH GFR  CBC  TACROLIMUS  LEVEL  I-STAT CG4 LACTIC ACID, ED  I-STAT CG4 LACTIC ACID, ED    EKG: None  Radiology: DG Chest 2 View Result Date: 11/13/2023 CLINICAL DATA:  cough. EXAM: CHEST - 2 VIEW COMPARISON:  11/04/2023. FINDINGS: There is heterogeneous opacity overlying the right lateral costophrenic angle, which may represent focal pneumonia. There are probable atelectatic changes at the left lung base. No dense consolidation or lung collapse. Bilateral costophrenic angles are clear. Normal cardio-mediastinal silhouette. No acute  osseous abnormalities. The soft tissues are within normal limits. IMPRESSION: *Heterogeneous opacity overlying the right lateral costophrenic angle, which may represent focal pneumonia. Follow-up to clearing is recommended. Electronically Signed   By: Ree Molt M.D.   On: 11/13/2023 15:17     Procedures   Medications Ordered in the ED  allopurinol  (ZYLOPRIM ) tablet 100 mg (has no administration in time range)  aspirin  EC tablet 81 mg (has no administration in time range)  atorvastatin  (LIPITOR) tablet 40 mg (has no administration in time range)  carvedilol  (COREG ) tablet 37.5 mg (has no administration in time range)  hydrALAZINE  (APRESOLINE ) tablet 100 mg (has no administration in time range)  levothyroxine  (SYNTHROID ) tablet 75 mcg (has no administration in time range)  tacrolimus  ER (ENVARSUS  XR) tablet 5 mg (has no administration in time range)  mycophenolate  (MYFORTIC ) EC tablet 540 mg (has no administration in time range)  cefTRIAXone  (ROCEPHIN ) 2 g in sodium chloride  0.9 % 100 mL IVPB (has no administration in time range)  azithromycin  (ZITHROMAX ) 500 mg in sodium chloride  0.9 % 250 mL IVPB (has no administration in time range)  insulin  aspart (novoLOG ) injection 0-15 Units (has no administration in time range)  insulin  aspart (novoLOG ) injection 0-5 Units (has no administration in time range)  insulin  glargine-yfgn (SEMGLEE ) injection 10 Units (has no administration in time range)  sodium zirconium cyclosilicate  (LOKELMA ) packet 10 g (has no administration in time range)  heparin  injection 5,000 Units (has no administration in time range)  0.45 % sodium chloride  infusion ( Intravenous New Bag/Given 11/13/23 2132)  acetaminophen  (TYLENOL ) tablet 650 mg (has no administration in time range)    Or  acetaminophen  (TYLENOL ) suppository 650 mg (has no administration in time range)  ondansetron  (ZOFRAN ) tablet 4 mg (has no administration in time range)    Or  ondansetron  (ZOFRAN )  injection 4 mg (has no administration in time range)  albuterol  (  PROVENTIL ) (2.5 MG/3ML) 0.083% nebulizer solution 2.5 mg (has no administration in time range)  loperamide  (IMODIUM ) capsule 4 mg (has no administration in time range)  feeding supplement (ENSURE PLUS HIGH PROTEIN) liquid 237 mL (has no administration in time range)  hydrALAZINE  (APRESOLINE ) tablet 100 mg (has no administration in time range)  sodium chloride  0.9 % bolus 500 mL (0 mLs Intravenous Stopped 11/13/23 1849)  ondansetron  (ZOFRAN ) injection 4 mg (4 mg Intravenous Given 11/13/23 1751)  azithromycin  (ZITHROMAX ) 500 mg in sodium chloride  0.9 % 250 mL IVPB (500 mg Intravenous New Bag/Given 11/13/23 1822)  cefTRIAXone  (ROCEPHIN ) 2 g in sodium chloride  0.9 % 100 mL IVPB (0 g Intravenous Stopped 11/13/23 1819)                                    Medical Decision Making Patient presents for evaluation of fever not feeling well and ongoing cough.  Has evidence of pneumonia on his chest x-ray.  Will treat him with Rocephin  and azithromycin .  He has a history of kidney transplant and does have a slight elevation in his creatinine from baseline, looks like it has been increasing over the last few weeks.  Patient was given IV fluids as well.  Was given Zofran  as well.  Discussed patient's case with hospitalist and patient will be admitted for further workup and management.  Patient is agreeable with the plan.  Problems Addressed: History of kidney transplant: chronic illness or injury with exacerbation, progression, or side effects of treatment Hyperkalemia: acute illness or injury Nausea and vomiting, unspecified vomiting type: acute illness or injury Pneumonia due to infectious organism, unspecified laterality, unspecified part of lung: acute illness or injury  Amount and/or Complexity of Data Reviewed External Data Reviewed: notes.    Details: Outpatient records reviewed and shows patient tested positive for covid about 1 week ago   Labs: ordered. Decision-making details documented in ED Course.    Details: Patient's lab workup reviewed by me does have a slight leukocytosis and slight elevation of his creatinine from baseline Radiology: ordered and independent interpretation performed. Decision-making details documented in ED Course.    Details: Imaging ordered and interpreted independently by radiology Chest xray shows evidence of pneumonia  Discussion of management or test interpretation with external provider(s): Dr. Verdene -hospitalist-I spoke with him regarding the patient's case and patient will be admitted for further workup and management.  Risk OTC drugs. Prescription drug management. Drug therapy requiring intensive monitoring for toxicity. Decision regarding hospitalization.      Final diagnoses:  Pneumonia due to infectious organism, unspecified laterality, unspecified part of lung  Hyperkalemia  History of kidney transplant  Nausea and vomiting, unspecified vomiting type    ED Discharge Orders     None          Gennaro Duwaine CROME, DO 11/13/23 2309

## 2023-11-13 NOTE — H&P (Signed)
 [facility] History and Physical  Tim Becker FMW:[id number] DOB: Jun 20, 1965 DOA: 11/13/2023   PCP: Tim Roxan BROCKS, NP  Specialists: Followed by nephrology at [facility]  Chief Complaint: Cough, nausea, loose stools ongoing for 2 to 3-week  HPI: Tim Becker is a 58 y.o. male with a past medical history of renal transplant on immunosuppressants, essential hypertension, insulin -dependent diabetes, coronary artery disease who was in his usual state of health till about 2 to 3 weeks ago when he started developing a cough which was dry to begin with and now has a whitish expectoration.  He developed fevers and had a temperature of 101.3 F a few days ago.  He presented to the emergency department on 11/04/2023 and was diagnosed with COVID-19.  He was given prescription for molnupiravir .  He has completed the course of this antiviral.  However his symptoms did not improve.  He continued to have poor oral intake with loose stools.  He felt dehydrated.  Some shortness of breath was present.  Denies any abdominal pain.  No chest pains.  Since his symptoms were not improving he decided to come back to the hospital.  In the emergency department he underwent blood work which showed mild hyperkalemia with a potassium of 5.2.  Creatinine noted to be 3.48 which is significantly elevated from his baseline.  WBC was noted to be normal.  Lactic acid was normal.  Chest x-ray suggested right lung pneumonia.  Patient will be hospitalized for further management.  Home Medications: Prior to Admission medications   Medication Sig Start Date End Date Taking? Authorizing Provider  Accu-Chek Softclix Lancets lancets Test blood sugar 3 times a day. DX: E11.40 08/07/23   Becker, Tim C, NP  acetaminophen  (TYLENOL ) 500 MG tablet Take 500 mg by mouth every 6 (six) hours as needed for moderate pain or mild pain.    [provider]  allopurinol  (ZYLOPRIM ) 100 MG tablet Take 100 mg by mouth every other day.     [provider]  amLODipine  (NORVASC ) 10 MG tablet Take 10 mg by mouth in the morning. 9am 01/12/18   [provider]  aspirin  EC 81 MG tablet Take 81 mg by mouth daily. Swallow whole.    [provider]  atorvastatin  (LIPITOR) 40 MG tablet Take 40 mg by mouth daily. 9pm.    [provider]  Blood Glucose Monitoring Suppl (ACCU-CHEK GUIDE ME) w/Device KIT 1 Device by Does not apply route 3 (three) times daily. 10/27/23   Becker, Tim C, NP  carvedilol  (COREG ) 12.5 MG tablet Take 37.5 mg by mouth in the morning, at noon, and at bedtime. 9am , and 9pm Patient taking differently: Take 37.5 mg by mouth 2 (two) times daily. 9am , and 9pm    [provider]  chlorthalidone (HYGROTON) 25 MG tablet Take 25 mg by mouth daily.    [provider]  empagliflozin (JARDIANCE) 10 MG TABS tablet Take 10 mg by mouth daily.    [provider]  furosemide (LASIX) 80 MG tablet Take 80 mg by mouth as needed for edema or fluid.    [provider]  gabapentin (NEURONTIN) 100 MG capsule Take 100 mg by mouth 2 (two) times daily. 04/08/23 04/07/24  [provider]  glucose blood (ACCU-CHEK GUIDE TEST) test strip Use to test blood sugar up to three times daily. Dx: E11.40 10/27/23   Becker, Tim C, NP  hydrALAZINE  (APRESOLINE ) 25 MG tablet Take 100 mg by mouth in the morning and at  bedtime.    [provider]  insulin  glargine (LANTUS  SOLOSTAR) 100 UNIT/ML Solostar Pen Inject 35 Units into the skin daily. INJECT 35 UNITS SUBCUTANEOUSLY ONCE DAILY BEFORE BREAKFAST 04/28/23   Becker, Tim C, NP  insulin  lispro (HUMALOG ) 100 UNIT/ML KwikPen INJECT 3-17 UNITS INTO THE SKIN 2 TIMES PER DAY AS NEEDED FOR HIGH BLOOD SUGAR 06/09/23   Becker, Tim C, NP  Insulin  Pen Needle (BD PEN NEEDLE NANO 2ND GEN) 32G X 4 MM MISC Use to test blood sugar four times daily. Dx:E11.40 07/26/22   Becker, Tim C, NP  Lancets Misc. (ACCU-CHEK SOFTCLIX LANCET DEV)  KIT 1 Device by Does not apply route 3 (three) times daily. 10/27/23   Becker, Tim C, NP  levothyroxine  (SYNTHROID ) 75 MCG tablet Take 75 mcg by mouth daily before breakfast. 7am    [provider]  mycophenolate  (MYFORTIC ) 180 MG EC tablet Take 3 tablets (540 mg total) by mouth in the morning and at bedtime. 3 tablets (540 mg total per dose) at 9am and 9pm 03/25/22   Tim Ip, MD  Potassium Citrate 15 MEQ (1620 MG) TBCR Take 15 mEq by mouth 2 (two) times daily. 09/24/23 09/23/24  [provider]  Semaglutide , 2 MG/DOSE, (OZEMPIC , 2 MG/DOSE,) 8 MG/3ML SOPN Inject 2 mg into the skin once a week. 11/27/23   Becker, Tim C, NP  spironolactone (ALDACTONE) 25 MG tablet Take 50 mg by mouth every morning.    [provider]  tacrolimus  ER (ENVARSUS  XR) 1 MG TB24 Take 5 mg by mouth daily before breakfast.    [provider]  triamcinolone  cream (KENALOG ) 0.1 % Apply 1 Application topically 2 (two) times daily. Apply to affected areas on abdomen 04/28/23   Becker, Tim C, NP    Allergies: No Known Allergies  Past Medical History: Past Medical History:  Diagnosis Date   Anemia    Arthritis    Cancer (HCC)    Renal Tumor   CKD (chronic kidney disease)    Coronary artery disease    Diabetes (HCC)    DM (diabetes mellitus) with complications (HCC)    ESRD (end stage renal disease) (HCC)    Pre- dialysis   History of colonoscopy 2011   Hypertension    Kidney transplanted 2022   Left kidney mass    Neuropathy    Obesity    Osteomyelitis, chronic, ankle or foot (HCC)    PONV (postoperative nausea and vomiting)    Renal insufficiency    Patient is on Dialysis and receives M,W and F.   S/P BKA (below knee amputation) (HCC) 08/2014   Right , Tim Becker, [place]   Sleep apnea    CPAP   Thyroid  disease     Past Surgical History:  Procedure Laterality Date   BASCILIC VEIN TRANSPOSITION Left 02/17/2015   Procedure: BASCILIC VEIN TRANSPOSITION;   Surgeon: Tim KANDICE Shawl, MD;  Location: [facility] ORS;  Service: Vascular;  Laterality: Left;   BELOW KNEE LEG AMPUTATION Right    CORONARY ANGIOPLASTY WITH STENT PLACEMENT     EYE SURGERY     FOOT SURGERY     Multiple R foot surgery for infection and charcot foot   I & D EXTREMITY Left 04/16/2016   Procedure: IRRIGATION AND DEBRIDEMENT EXTREMITY/GREAT TOE AMP.;  Surgeon: Thresa CHRISTELLA Sar, DPM;  Location: [facility] OR;  Service: Podiatry;  Laterality: Left;   IR ANGIOGRAM FOLLOW UP STUDY  2025   JOINT REPLACEMENT     LAPAROSCOPIC NEPHRECTOMY, HAND  ASSISTED Left 07/11/2015   Procedure: HAND ASSISTED LAPAROSCOPIC NEPHRECTOMY;  Surgeon: Rosina Riis, MD;  Location: [facility] ORS;  Service: Urology;  Laterality: Left;   PERIPHERAL VASCULAR CATHETERIZATION Left 12/06/2014   Procedure: A/V Shuntogram/Fistulagram;  Surgeon: Tim KANDICE Shawl, MD;  Location: [facility] INVASIVE CV LAB;  Service: Cardiovascular;  Laterality: Left;   PERIPHERAL VASCULAR CATHETERIZATION Left 12/06/2014   Procedure: A/V Shunt Intervention;  Surgeon: Tim KANDICE Shawl, MD;  Location: [facility] INVASIVE CV LAB;  Service: Cardiovascular;  Laterality: Left;   TONSILLECTOMY      Social History: Lives with his wife.  No smoking alcohol use or recreational drug use.  Independent with daily activities.  Family History:  Family History  Problem Relation Age of Onset   Diabetes Mother    Kidney disease Mother    Breast cancer Mother      Review of Systems - History obtained from the patient General ROS: positive for  - chills, fatigue, and fever Psychological ROS: negative Ophthalmic ROS: negative ENT ROS: negative Allergy and Immunology ROS: negative Hematological and Lymphatic ROS: negative Endocrine ROS: negative Respiratory ROS: As in HPI Cardiovascular ROS: no chest pain or dyspnea on exertion Gastrointestinal ROS: As in HPI Genito-Urinary ROS: has noted dark urine Musculoskeletal ROS: negative Neurological ROS: no TIA or stroke  symptoms Dermatological ROS: negative  Physical Examination  Vitals:   11/13/23 1423 11/13/23 1806  BP: (!) 155/78 (!) 155/71  Pulse: 91 99  Resp: 18 18  Temp: 100.2 F (37.9 C) (!) 100.6 F (38.1 C)  TempSrc: Oral Oral  SpO2: 94% 94%    BP (!) 155/71   Pulse 99   Temp (!) 100.6 F (38.1 C) (Oral)   Resp 18   SpO2 94%   General appearance: alert, cooperative, appears stated age, and no distress Head: Normocephalic, without obvious abnormality, atraumatic Eyes: conjunctivae/corneas clear. PERRL, EOM's intact.  Neck: no adenopathy, no carotid bruit, no JVD, supple, symmetrical, trachea midline, and thyroid  not enlarged, symmetric, no tenderness/mass/nodules Back: symmetric, no curvature. ROM normal. No CVA tenderness. Resp: Normal effort at rest.  Coarse breath sound bilaterally.  No wheezing.  No definite crackles appreciated. Cardio: regular rate and rhythm, S1, S2 normal, no murmur, click, rub or gallop GI: soft, non-tender; bowel sounds normal; no masses,  no organomegaly Extremities:  status post right BKA previously.  Has a prosthesis. Pulses: 2+ and symmetric Skin: Skin color, texture, turgor normal. No rashes or lesions Lymph nodes: Cervical, supraclavicular, and axillary nodes normal. Neurologic: Alert and oriented x 3.  No focal neurological deficits.   Labs on Admission: I have personally reviewed following labs and imaging studies  CBC: Recent Labs  Lab 11/13/23 1447  WBC 4.5  NEUTROABS 3.4  HGB 10.0*  HCT 34.0*  MCV 84.2  PLT 196   Basic Metabolic Panel: Recent Labs  Lab 11/13/23 1447  NA 136  K 5.2*  CL 108  CO2 19*  GLUCOSE 103*  BUN 76*  CREATININE 3.48*  CALCIUM  8.7*   GFR: Estimated Creatinine Clearance: 27.3 mL/min (A) (by C-G formula based on SCr of 3.48 mg/dL (H)). Liver Function Tests: Recent Labs  Lab 11/13/23 1447  AST 17  ALT 23  ALKPHOS 95  BILITOT 0.9  PROT 6.6  ALBUMIN 3.6    Radiological Exams on Admission: DG  Chest 2 View Result Date: 11/13/2023 CLINICAL DATA:  cough. EXAM: CHEST - 2 VIEW COMPARISON:  11/04/2023. FINDINGS: There is heterogeneous opacity overlying the right lateral costophrenic angle, which may represent  focal pneumonia. There are probable atelectatic changes at the left lung base. No dense consolidation or lung collapse. Bilateral costophrenic angles are clear. Normal cardio-mediastinal silhouette. No acute osseous abnormalities. The soft tissues are within normal limits. IMPRESSION: *Heterogeneous opacity overlying the right lateral costophrenic angle, which may represent focal pneumonia. Follow-up to clearing is recommended. Electronically Signed   By: Ree Molt M.D.   On: 11/13/2023 15:17    Problem List  Principal Problem:   CAP (community acquired pneumonia) Active Problems:   Type 2 diabetes mellitus with diabetic neuropathy (HCC)   Essential hypertension   Hypothyroidism   Chronic kidney disease, stage IV (severe) (HCC)   COVID-19 virus infection   Assessment: This is a 58 year old African-American male with past medical history as stated earlier who comes in with 2 to 3 weeks history of cough fever fatigue.  He was diagnosed with COVID-19 on 8/5.  His current symptoms are likely a sequelae of the COVID-19 illness.  He could have secondary bacterial illness.  He is noted to have acute kidney injury probably from dehydration.  Plan:  #1. Community-acquired pneumonia: Recently diagnosed with COVID-19.  Could have secondary bacterial infection.  WBC however is normal.  He is immunocompromised as he is on immunosuppressants for his renal transplant.  Agree with treating him with antibacterials for now.  Lactic acid level is reassuringly normal.  Continue with ceftriaxone  and azithromycin .  Follow-up on cultures.  #2. COVID-19 infection: Treated with molnupiravir  and has completed course of same.  He is not hypoxic.  No clear indication for steroids at this time.  Isolation  precautions.  #3.  Diarrhea: Most likely a result of COVID-19.  Imodium  as needed for now.  No need for stool testing currently.  #4.  Acute kidney injury on chronic kidney disease stage IV/hyperkalemia/normal anion gap metabolic acidosis: Lokelma  will be ordered for hyperkalemia.  His baseline creatinine is around 2.6.  He will be given IV fluids.  His diuretics will be held.  Monitor urine output.  Check UA.  Recheck labs in the morning.  If renal function does not improve then we will need to involve nephrology.  #5.  Renal transplant: Noted to be on mycophenolate  and tacrolimus  which will be continued.  Followed by nephrology at [facility].  Discussed briefly with Dr. Melia who recommends continuing the immunosuppressants and checking her tacrolimus  level in the morning.  #6.  Diabetes mellitus type 2, insulin -dependent: Monitor CBGs.  SSI.  Long-acting insulin  will be continued at a lower dose.  Check HbA1c.  #7.  Essential hypertension: Hold his diuretics.  Monitor blood pressures closely.  Okay to continue with carvedilol  and hydralazine .  #8.  Obstructive sleep apnea: Patient plans to have his wife bring his home CPAP machine.  #9. Status post right BKA: This was in 2016 or 2017.  Stable.  Uses a prosthesis.   DVT Prophylaxis: Subcutaneous heparin  Code Status: Full code Family Communication: Discussed with patient Disposition: Hopefully home in improved Consults called: None yet Admission Status: Status is: Inpatient Remains inpatient appropriate because: Community-acquired pneumonia requiring IV antibiotics, acute kidney injury requiring IV fluids  Severity of Illness: The appropriate patient status for this patient is INPATIENT. Inpatient status is judged to be reasonable and necessary in order to provide the required intensity of service to ensure the patient's safety. The patient's presenting symptoms, physical exam findings, and initial radiographic and laboratory data in  the context of their chronic comorbidities is felt to place them at high  risk for further clinical deterioration. Furthermore, it is not anticipated that the patient will be medically stable for discharge from the hospital within 2 midnights of admission.   * I certify that at the point of admission it is my clinical judgment that the patient will require inpatient hospital care spanning beyond 2 midnights from the point of admission due to high intensity of service, high risk for further deterioration and high frequency of surveillance required.*   Further management decisions will depend on results of further testing and patient's response to treatment.   Sherine Cortese  [facility] Pager on [email or web address]  11/13/2023, 6:11 PM

## 2023-11-13 NOTE — ED Triage Notes (Signed)
 Patient c/o cough and body aches x 1 week. Patient report he was positive for covid a week ago. Patient report his not been better even taking medication for cough and fever. Patient report nausea, denies vomiting.

## 2023-11-14 DIAGNOSIS — J189 Pneumonia, unspecified organism: Secondary | ICD-10-CM | POA: Diagnosis not present

## 2023-11-14 DIAGNOSIS — N179 Acute kidney failure, unspecified: Secondary | ICD-10-CM | POA: Diagnosis not present

## 2023-11-14 DIAGNOSIS — Z940 Kidney transplant status: Secondary | ICD-10-CM | POA: Diagnosis not present

## 2023-11-14 LAB — CBC
HCT: 31.9 % — ABNORMAL LOW (ref 39.0–52.0)
Hemoglobin: 9.3 g/dL — ABNORMAL LOW (ref 13.0–17.0)
MCH: 25 pg — ABNORMAL LOW (ref 26.0–34.0)
MCHC: 29.2 g/dL — ABNORMAL LOW (ref 30.0–36.0)
MCV: 85.8 fL (ref 80.0–100.0)
Platelets: 172 K/uL (ref 150–400)
RBC: 3.72 MIL/uL — ABNORMAL LOW (ref 4.22–5.81)
RDW: 14.2 % (ref 11.5–15.5)
WBC: 3.7 K/uL — ABNORMAL LOW (ref 4.0–10.5)
nRBC: 0 % (ref 0.0–0.2)

## 2023-11-14 LAB — BASIC METABOLIC PANEL WITH GFR
Anion gap: 8 (ref 5–15)
BUN: 67 mg/dL — ABNORMAL HIGH (ref 6–20)
CO2: 17 mmol/L — ABNORMAL LOW (ref 22–32)
Calcium: 8.2 mg/dL — ABNORMAL LOW (ref 8.9–10.3)
Chloride: 111 mmol/L (ref 98–111)
Creatinine, Ser: 3.3 mg/dL — ABNORMAL HIGH (ref 0.61–1.24)
GFR, Estimated: 21 mL/min — ABNORMAL LOW (ref >60)
Glucose, Bld: 87 mg/dL (ref 70–99)
Potassium: 4.8 mmol/L (ref 3.5–5.1)
Sodium: 136 mmol/L (ref 135–145)

## 2023-11-14 LAB — GLUCOSE, CAPILLARY
Glucose-Capillary: 84 mg/dL (ref 70–99)
Glucose-Capillary: 90 mg/dL (ref 70–99)
Glucose-Capillary: 96 mg/dL (ref 70–99)
Glucose-Capillary: 95 mg/dL (ref 70–99)

## 2023-11-14 LAB — OCCULT BLOOD X 1 CARD TO LAB, STOOL: Fecal Occult Bld: POSITIVE — AB

## 2023-11-14 LAB — HIV ANTIBODY (ROUTINE TESTING W REFLEX): HIV Screen 4th Generation wRfx: NONREACTIVE

## 2023-11-14 LAB — STREP PNEUMONIAE URINARY ANTIGEN: Strep Pneumo Urinary Antigen: NEGATIVE

## 2023-11-14 MED ORDER — LOPERAMIDE HCL 2 MG PO CAPS
4.0000 mg | ORAL_CAPSULE | Freq: Once | ORAL | Status: AC
  Administered 2023-11-14: 4 mg via ORAL
  Filled 2023-11-14: qty 2

## 2023-11-14 MED ORDER — SODIUM BICARBONATE 650 MG PO TABS
650.0000 mg | ORAL_TABLET | Freq: Two times a day (BID) | ORAL | Status: DC
  Administered 2023-11-15 – 2023-11-17 (×5): 650 mg via ORAL
  Filled 2023-11-14 (×7): qty 1

## 2023-11-14 MED ORDER — PANTOPRAZOLE SODIUM 40 MG PO TBEC
40.0000 mg | DELAYED_RELEASE_TABLET | Freq: Every day | ORAL | Status: DC
  Administered 2023-11-15 – 2023-11-17 (×3): 40 mg via ORAL
  Filled 2023-11-14 (×4): qty 1

## 2023-11-14 MED ORDER — SODIUM CHLORIDE 0.45 % IV SOLN: INTRAVENOUS | Status: DC

## 2023-11-14 MED ORDER — ORAL CARE MOUTH RINSE
15.0000 mL | OROMUCOSAL | Status: DC | PRN
Start: 2023-11-14 — End: 2023-11-17

## 2023-11-14 MED ORDER — TACROLIMUS ER 4 MG PO TB24
5.0000 mg | ORAL_TABLET | Freq: Every day | ORAL | Status: DC
  Administered 2023-11-15 – 2023-11-17 (×3): 5 mg via ORAL
  Filled 2023-11-14 (×4): qty 1

## 2023-11-14 NOTE — Progress Notes (Signed)
   11/14/23 1514  TOC Brief Assessment  Insurance and Status Reviewed  Patient has primary care physician Yes  Home environment has been reviewed Apartment  Prior level of function: Independent with ADL's  Prior/Current Home Services No current home services  Social Drivers of Health Review SDOH reviewed no interventions necessary  Readmission risk has been reviewed Yes  Transition of care needs no transition of care needs at this time

## 2023-11-14 NOTE — Progress Notes (Signed)
 [facility] PROGRESS NOTE   Asbury Hair FMW:[id number] DOB: 22-Jan-1966 DOA: 11/13/2023  PCP: Leonarda Roxan BROCKS, NP  Brief History: 58 y.o. male with a past medical history of renal transplant on immunosuppressants, essential hypertension, insulin -dependent diabetes, coronary artery disease who was in his usual state of health till about 2 to 3 weeks ago when he started developing a cough which was dry to begin with and now has a whitish expectoration.  He developed fevers and had a temperature of 101.3 F a few days ago.  He presented to the emergency department on 11/04/2023 and was diagnosed with COVID-19.  He was given prescription for molnupiravir .  He has completed the course of this antiviral.  However his symptoms did not improve.  He continued to have poor oral intake with loose stools.  He felt dehydrated.  Some shortness of breath was present.  Denies any abdominal pain.  No chest pains.  Since his symptoms were not improving he decided to come back to the hospital.  Consultants: Phone discussion with nephrology  Procedures: None    Subjective/Interval History: Patient feels a little better this morning.  Cough is improving.  Has had 2 episodes of loose stools in the last 24 hours.  Denies any abdominal pain nausea or vomiting.    Assessment/Plan:  Community-acquired pneumonia Could have secondary bacterial infection.  X-ray suggested right lung pneumonia.  Was diagnosed with COVID-19 last week.  He is immunocompromise as he is on immunosuppressants for his renal transplant.  Continue with ceftriaxone  and azithromycin .  Lactic acid level was normal. No oxygen requirements currently.  Follow-up on cultures.  COVID-19 infection Treated with molnupiravir  and completed the course of same.  No clear indication for steroids as he is not hypoxic.  Continue with isolation precautions.  Diarrhea Most likely due to COVID-19.  Imodium  as needed.  No indication for stool testing  or imaging studies at this time as abdomen is benign.  Acute kidney injury on chronic kidney disease stage IV/hyperkalemia/normal anion gap metabolic acidosis Continue with IV hydration.  Creatinine is noted to be slightly better today.  Recheck labs tomorrow.  His baseline seems to be around 2.6.  Sodium bicarbonate  will be ordered.  Potassium level has improved.  Renal transplant He is on mycophenolate  and tacrolimus  which is being continued.  Tacrolimus  level was ordered for this morning and is pending.  Followed by nephrology at [facility].  Diabetes mellitus type 2, insulin -dependent Continue SSI and glargine.  HbA1c is pending.  Essential hypertension Holding his diuretics.  Continuing carvedilol  and hydralazine .  Blood pressure is reasonably well-controlled.  Will not be too aggressive with his AKI.  Normocytic anemia Most likely anemia of chronic kidney disease.  No evidence of overt bleeding.  Obstructive sleep apnea CPAP at nighttime.  Status post right BKA This was in 2016 or 2017.  Uses a prosthesis.  Obesity Estimated body mass index is 35.43 kg/m as calculated from the following:   Height as of 10/27/23: 5' 8 (1.727 m).   Weight as of 11/04/23: 105.7 kg.  DVT Prophylaxis: Subcutaneous heparin  Code Status: Full code Family Communication: Discussed with patient Disposition Plan: Hopefully return home when improved  Status is: Inpatient Remains inpatient appropriate because: Community-acquired pneumonia, AKI      Medications: Scheduled:  allopurinol   100 mg Oral QODAY   aspirin  EC  81 mg Oral Daily   atorvastatin   40 mg Oral Daily   carvedilol   37.5 mg Oral BID   feeding  supplement  237 mL Oral BID BM   heparin   5,000 Units Subcutaneous Q8H   hydrALAZINE   100 mg Oral BID   insulin  aspart  0-15 Units Subcutaneous TID WC   insulin  aspart  0-5 Units Subcutaneous QHS   insulin  glargine-yfgn  10 Units Subcutaneous Daily   levothyroxine   75 mcg Oral Q0600    loperamide   4 mg Oral Once   mycophenolate   540 mg Oral BID   sodium bicarbonate   650 mg Oral BID   tacrolimus  ER  5 mg Oral QAC breakfast   Continuous:  sodium chloride  100 mL/hr at 11/14/23 0709   azithromycin      cefTRIAXone  (ROCEPHIN )  IV     PRN:acetaminophen  **OR** acetaminophen , albuterol , loperamide , ondansetron  **OR** ondansetron  (ZOFRAN ) IV, mouth rinse  Antibiotics: Anti-infectives (From admission, onward)    Start     Dose/Rate Route Frequency Ordered Stop   11/14/23 1800  cefTRIAXone  (ROCEPHIN ) 2 g in sodium chloride  0.9 % 100 mL IVPB        2 g 200 mL/hr over 30 Minutes Intravenous Every 24 hours 11/13/23 1810 11/19/23 1759   11/14/23 1800  azithromycin  (ZITHROMAX ) 500 mg in sodium chloride  0.9 % 250 mL IVPB        500 mg 250 mL/hr over 60 Minutes Intravenous Every 24 hours 11/13/23 1810 11/19/23 1759   11/13/23 1700  cefTRIAXone  (ROCEPHIN ) 1 g in sodium chloride  0.9 % 100 mL IVPB  Status:  Discontinued        1 g 200 mL/hr over 30 Minutes Intravenous  Once 11/13/23 1657 11/13/23 1659   11/13/23 1700  azithromycin  (ZITHROMAX ) 500 mg in sodium chloride  0.9 % 250 mL IVPB        500 mg 250 mL/hr over 60 Minutes Intravenous  Once 11/13/23 1657 11/13/23 1922   11/13/23 1700  cefTRIAXone  (ROCEPHIN ) 2 g in sodium chloride  0.9 % 100 mL IVPB        2 g 200 mL/hr over 30 Minutes Intravenous  Once 11/13/23 1659 11/13/23 1819       Objective:  Vital Signs  Vitals:   11/13/23 1806 11/13/23 2101 11/14/23 0136 11/14/23 0911  BP: (!) 155/71 (!) 159/73 (!) 161/71 (!) 145/81  Pulse: 99 94 91 88  Resp: 18 20 19 17   Temp: (!) 100.6 F (38.1 C) 100 F (37.8 C) 100.3 F (37.9 C) 99.4 F (37.4 C)  TempSrc: Oral   Oral  SpO2: 94%       Intake/Output Summary (Last 24 hours) at 11/14/2023 0941 Last data filed at 11/14/2023 0600 Gross per 24 hour  Intake 1922.45 ml  Output --  Net 1922.45 ml   There were no vitals filed for this visit.  General appearance: Awake  alert.  In no distress Resp: Slightly improved air entry bilaterally with no definite crackles or wheezing. Cardio: S1-S2 is normal regular.  No S3-S4.  No rubs murmurs or bruit GI: Abdomen is soft.  Nontender nondistended.  Bowel sounds are present normal.  No masses organomegaly Extremities: No edema.  Full range of motion of lower extremities. Neurologic: Alert and oriented x3.  No focal neurological deficits.    Lab Results:  Data Reviewed: I have personally reviewed following labs and reports of the imaging studies  CBC: Recent Labs  Lab 11/13/23 1447 11/14/23 0447  WBC 4.5 3.7*  NEUTROABS 3.4  --   HGB 10.0* 9.3*  HCT 34.0* 31.9*  MCV 84.2 85.8  PLT 196 172    Basic Metabolic  Panel: Recent Labs  Lab 11/13/23 1447 11/14/23 0447  NA 136 136  K 5.2* 4.8  CL 108 111  CO2 19* 17*  GLUCOSE 103* 87  BUN 76* 67*  CREATININE 3.48* 3.30*  CALCIUM  8.7* 8.2*    GFR: Estimated Creatinine Clearance: 28.7 mL/min (A) (by C-G formula based on SCr of 3.3 mg/dL (H)).  Liver Function Tests: Recent Labs  Lab 11/13/23 1447  AST 17  ALT 23  ALKPHOS 95  BILITOT 0.9  PROT 6.6  ALBUMIN 3.6    CBG: Recent Labs  Lab 11/13/23 2112 11/14/23 0727  GLUCAP 110* 84     Recent Results (from the past 240 hours)  Resp panel by RT-PCR (RSV, Flu A&B, Covid) Anterior Nasal Swab     Status: Abnormal   Collection Time: 11/04/23 11:07 AM   Specimen: Anterior Nasal Swab  Result Value Ref Range Status   SARS Coronavirus 2 by RT PCR POSITIVE (A) NEGATIVE Final    Comment: (NOTE) SARS-CoV-2 target nucleic acids are DETECTED.  The SARS-CoV-2 RNA is generally detectable in upper respiratory specimens during the acute phase of infection. Positive results are indicative of the presence of the identified virus, but do not rule out bacterial infection or co-infection with other pathogens not detected by the test. Clinical correlation with patient history and other diagnostic  information is necessary to determine patient infection status. The expected result is Negative.  Fact Sheet for Patients: BloggerCourse.com  Fact Sheet for Healthcare Providers: SeriousBroker.it  This test is not yet approved or cleared by the [place]  FDA and  has been authorized for detection and/or diagnosis of SARS-CoV-2 by FDA under an Emergency Use Authorization (EUA).  This EUA will remain in effect (meaning this test can be used) for the duration of  the COVID-19 declaration under Section 564(b)(1) of the A ct, 21 U.S.C. section 360bbb-3(b)(1), unless the authorization is terminated or revoked sooner.     Influenza A by PCR NEGATIVE NEGATIVE Final   Influenza B by PCR NEGATIVE NEGATIVE Final    Comment: (NOTE) The Xpert Xpress SARS-CoV-2/FLU/RSV plus assay is intended as an aid in the diagnosis of influenza from Nasopharyngeal swab specimens and should not be used as a sole basis for treatment. Nasal washings and aspirates are unacceptable for Xpert Xpress SARS-CoV-2/FLU/RSV testing.  Fact Sheet for Patients: BloggerCourse.com  Fact Sheet for Healthcare Providers: SeriousBroker.it  This test is not yet approved or cleared by the [place]  FDA and has been authorized for detection and/or diagnosis of SARS-CoV-2 by FDA under an Emergency Use Authorization (EUA). This EUA will remain in effect (meaning this test can be used) for the duration of the COVID-19 declaration under Section 564(b)(1) of the Act, 21 U.S.C. section 360bbb-3(b)(1), unless the authorization is terminated or revoked.     Resp Syncytial Virus by PCR NEGATIVE NEGATIVE Final    Comment: (NOTE) Fact Sheet for Patients: BloggerCourse.com  Fact Sheet for Healthcare Providers: SeriousBroker.it  This test is not yet approved or cleared by the  [place]  FDA and has been authorized for detection and/or diagnosis of SARS-CoV-2 by FDA under an Emergency Use Authorization (EUA). This EUA will remain in effect (meaning this test can be used) for the duration of the COVID-19 declaration under Section 564(b)(1) of the Act, 21 U.S.C. section 360bbb-3(b)(1), unless the authorization is terminated or revoked.  Performed at [facility], [address], [place], [place] [address]   Culture, blood (routine x 2)  Status: None (Preliminary result)   Collection Time: 11/13/23  5:22 PM   Specimen: BLOOD RIGHT HAND  Result Value Ref Range Status   Specimen Description   Final    BLOOD RIGHT HAND Performed at [facility], [address], [place], [place] [address]    Special Requests   Final    BOTTLES DRAWN AEROBIC AND ANAEROBIC Blood Culture results may not be optimal due to an inadequate volume of blood received in culture bottles Performed at [facility], [address] [place], [place] [address]    Culture   Final    NO GROWTH < 12 HOURS Performed at [facility], [address], [place], [place] [address]    Report Status PENDING  Incomplete  Culture, blood (routine x 2)     Status: None (Preliminary result)   Collection Time: 11/13/23  5:34 PM   Specimen: BLOOD RIGHT WRIST  Result Value Ref Range Status   Specimen Description   Final    BLOOD RIGHT WRIST Performed at [facility], [address], [place], [place] [address]    Special Requests   Final    BOTTLES DRAWN AEROBIC AND ANAEROBIC Blood Culture results may not be optimal due to an inadequate volume of blood received in culture bottles Performed at [facility], [address], [place], [place] [address]    Culture   Final    NO GROWTH < 12 HOURS Performed at [facility], [address] [place], [place] [address]    Report Status PENDING  Incomplete      Radiology  Studies: DG Chest 2 View Result Date: 11/13/2023 CLINICAL DATA:  cough. EXAM: CHEST - 2 VIEW COMPARISON:  11/04/2023. FINDINGS: There is heterogeneous opacity overlying the right lateral costophrenic angle, which may represent focal pneumonia. There are probable atelectatic changes at the left lung base. No dense consolidation or lung collapse. Bilateral costophrenic angles are clear. Normal cardio-mediastinal silhouette. No acute osseous abnormalities. The soft tissues are within normal limits. IMPRESSION: *Heterogeneous opacity overlying the right lateral costophrenic angle, which may represent focal pneumonia. Follow-up to clearing is recommended. Electronically Signed   By: Ree Molt M.D.   On: 11/13/2023 15:17       LOS: 1 day   Laelah Siravo  [facility] Pager on [email or web address]  11/14/2023, 9:41 AM

## 2023-11-14 NOTE — Plan of Care (Signed)
  Problem: Education: Goal: Knowledge of risk factors and measures for prevention of condition will improve Outcome: Progressing   Problem: Coping: Goal: Psychosocial and spiritual needs will be supported Outcome: Progressing   Problem: Respiratory: Goal: Will maintain a patent airway Outcome: Progressing Goal: Complications related to the disease process, condition or treatment will be avoided or minimized Outcome: Progressing   Problem: Education: Goal: Ability to describe self-care measures that may prevent or decrease complications (Diabetes Survival Skills Education) will improve Outcome: Progressing Goal: Individualized Educational Video(s) Outcome: Progressing   Problem: Coping: Goal: Ability to adjust to condition or change in health will improve Outcome: Progressing   Problem: Fluid Volume: Goal: Ability to maintain a balanced intake and output will improve Outcome: Progressing   Problem: Health Behavior/Discharge Planning: Goal: Ability to identify and utilize available resources and services will improve Outcome: Progressing Goal: Ability to manage health-related needs will improve Outcome: Progressing   Problem: Metabolic: Goal: Ability to maintain appropriate glucose levels will improve Outcome: Progressing   Problem: Nutritional: Goal: Maintenance of adequate nutrition will improve Outcome: Progressing Goal: Progress toward achieving an optimal weight will improve Outcome: Progressing   Problem: Skin Integrity: Goal: Risk for impaired skin integrity will decrease Outcome: Progressing   Problem: Tissue Perfusion: Goal: Adequacy of tissue perfusion will improve Outcome: Progressing   Problem: Education: Goal: Knowledge of General Education information will improve Description: Including pain rating scale, medication(s)/side effects and non-pharmacologic comfort measures Outcome: Progressing   Problem: Health Behavior/Discharge Planning: Goal:  Ability to manage health-related needs will improve Outcome: Progressing   Problem: Clinical Measurements: Goal: Ability to maintain clinical measurements within normal limits will improve Outcome: Progressing Goal: Will remain free from infection Outcome: Progressing Goal: Diagnostic test results will improve Outcome: Progressing Goal: Respiratory complications will improve Outcome: Progressing Goal: Cardiovascular complication will be avoided Outcome: Progressing   Problem: Activity: Goal: Risk for activity intolerance will decrease Outcome: Progressing   Problem: Nutrition: Goal: Adequate nutrition will be maintained Outcome: Progressing   Problem: Coping: Goal: Level of anxiety will decrease Outcome: Progressing   Problem: Elimination: Goal: Will not experience complications related to bowel motility Outcome: Progressing Goal: Will not experience complications related to urinary retention Outcome: Progressing   Problem: Pain Managment: Goal: General experience of comfort will improve and/or be controlled Outcome: Progressing   Problem: Safety: Goal: Ability to remain free from injury will improve Outcome: Progressing   Problem: Skin Integrity: Goal: Risk for impaired skin integrity will decrease Outcome: Progressing   Problem: Activity: Goal: Ability to tolerate increased activity will improve Outcome: Progressing   Problem: Clinical Measurements: Goal: Ability to maintain a body temperature in the normal range will improve Outcome: Progressing   Problem: Respiratory: Goal: Ability to maintain adequate ventilation will improve Outcome: Progressing Goal: Ability to maintain a clear airway will improve Outcome: Progressing

## 2023-11-14 NOTE — Progress Notes (Signed)
   11/14/23 2121  BiPAP/CPAP/SIPAP  BiPAP/CPAP/SIPAP Pt Type Adult  BiPAP/CPAP/SIPAP Resmed (home euipment)  Mask Type Nasal mask  Respiratory Rate 22 breaths/min  FiO2 (%) 21 %  Heater Temperature  (humidifier filled with sterile water)  Patient Home Machine Yes  Safety Check Completed by RT for Home Unit Yes, no issues noted  Patient Home Mask Yes  Patient Home Tubing Yes  CPAP/SIPAP surface wiped down Yes  Device Plugged into RED Power Outlet Yes  BiPAP/CPAP /SiPAP Vitals  Resp (!) 22  MEWS Score/Color  MEWS Score 1  MEWS Score Color Tim Becker

## 2023-11-14 NOTE — Plan of Care (Signed)
  Problem: Respiratory: Goal: Will maintain a patent airway Outcome: Progressing Goal: Complications related to the disease process, condition or treatment will be avoided or minimized Outcome: Progressing   Problem: Metabolic: Goal: Ability to maintain appropriate glucose levels will improve Outcome: Progressing   Problem: Clinical Measurements: Goal: Ability to maintain clinical measurements within normal limits will improve Outcome: Progressing Goal: Diagnostic test results will improve Outcome: Progressing Goal: Respiratory complications will improve Outcome: Progressing Goal: Cardiovascular complication will be avoided Outcome: Progressing

## 2023-11-15 DIAGNOSIS — J189 Pneumonia, unspecified organism: Secondary | ICD-10-CM | POA: Diagnosis not present

## 2023-11-15 DIAGNOSIS — G4733 Obstructive sleep apnea (adult) (pediatric): Secondary | ICD-10-CM

## 2023-11-15 DIAGNOSIS — Z794 Long term (current) use of insulin: Secondary | ICD-10-CM

## 2023-11-15 DIAGNOSIS — E1140 Type 2 diabetes mellitus with diabetic neuropathy, unspecified: Secondary | ICD-10-CM

## 2023-11-15 DIAGNOSIS — U071 COVID-19: Secondary | ICD-10-CM

## 2023-11-15 DIAGNOSIS — D638 Anemia in other chronic diseases classified elsewhere: Secondary | ICD-10-CM

## 2023-11-15 DIAGNOSIS — E875 Hyperkalemia: Secondary | ICD-10-CM

## 2023-11-15 DIAGNOSIS — Z940 Kidney transplant status: Secondary | ICD-10-CM

## 2023-11-15 DIAGNOSIS — R197 Diarrhea, unspecified: Secondary | ICD-10-CM

## 2023-11-15 DIAGNOSIS — I10 Essential (primary) hypertension: Secondary | ICD-10-CM

## 2023-11-15 DIAGNOSIS — E872 Acidosis, unspecified: Secondary | ICD-10-CM

## 2023-11-15 DIAGNOSIS — E039 Hypothyroidism, unspecified: Secondary | ICD-10-CM

## 2023-11-15 DIAGNOSIS — N189 Chronic kidney disease, unspecified: Secondary | ICD-10-CM

## 2023-11-15 LAB — CBC
HCT: 33.9 % — ABNORMAL LOW (ref 39.0–52.0)
Hemoglobin: 9.7 g/dL — ABNORMAL LOW (ref 13.0–17.0)
MCH: 24.3 pg — ABNORMAL LOW (ref 26.0–34.0)
MCHC: 28.6 g/dL — ABNORMAL LOW (ref 30.0–36.0)
MCV: 84.8 fL (ref 80.0–100.0)
Platelets: 191 K/uL (ref 150–400)
RBC: 4 MIL/uL — ABNORMAL LOW (ref 4.22–5.81)
RDW: 14.1 % (ref 11.5–15.5)
WBC: 3.7 K/uL — ABNORMAL LOW (ref 4.0–10.5)
nRBC: 0 % (ref 0.0–0.2)

## 2023-11-15 LAB — BASIC METABOLIC PANEL WITH GFR
Anion gap: 8 (ref 5–15)
BUN: 62 mg/dL — ABNORMAL HIGH (ref 6–20)
CO2: 15 mmol/L — ABNORMAL LOW (ref 22–32)
Calcium: 8.3 mg/dL — ABNORMAL LOW (ref 8.9–10.3)
Chloride: 112 mmol/L — ABNORMAL HIGH (ref 98–111)
Creatinine, Ser: 3.02 mg/dL — ABNORMAL HIGH (ref 0.61–1.24)
GFR, Estimated: 23 mL/min — ABNORMAL LOW (ref >60)
Glucose, Bld: 87 mg/dL (ref 70–99)
Potassium: 5.3 mmol/L — ABNORMAL HIGH (ref 3.5–5.1)
Sodium: 135 mmol/L (ref 135–145)

## 2023-11-15 LAB — GLUCOSE, CAPILLARY
Glucose-Capillary: 95 mg/dL (ref 70–99)
Glucose-Capillary: 90 mg/dL (ref 70–99)
Glucose-Capillary: 163 mg/dL — ABNORMAL HIGH (ref 70–99)
Glucose-Capillary: 247 mg/dL — ABNORMAL HIGH (ref 70–99)

## 2023-11-15 LAB — PROCALCITONIN: Procalcitonin: <0.1 ng/mL

## 2023-11-15 LAB — HEMOGLOBIN A1C
Hgb A1c MFr Bld: 6.5 % — ABNORMAL HIGH (ref 4.8–5.6)
Mean Plasma Glucose: 140 mg/dL

## 2023-11-15 LAB — TACROLIMUS LEVEL: Tacrolimus (FK506) - LabCorp: 6.9 ng/mL (ref 5.0–20.0)

## 2023-11-15 MED ORDER — METHYLPREDNISOLONE SODIUM SUCC 40 MG IJ SOLR
40.0000 mg | Freq: Every day | INTRAMUSCULAR | Status: DC
  Administered 2023-11-16 – 2023-11-17 (×2): 40 mg via INTRAVENOUS
  Filled 2023-11-15 (×2): qty 1

## 2023-11-15 MED ORDER — METHYLPREDNISOLONE SODIUM SUCC 125 MG IJ SOLR
80.0000 mg | Freq: Once | INTRAMUSCULAR | Status: AC
  Administered 2023-11-15: 80 mg via INTRAVENOUS
  Filled 2023-11-15: qty 2

## 2023-11-15 MED ORDER — SODIUM ZIRCONIUM CYCLOSILICATE 10 G PO PACK
10.0000 g | PACK | Freq: Once | ORAL | Status: DC
  Filled 2023-11-15: qty 1

## 2023-11-15 NOTE — Assessment & Plan Note (Addendum)
 Continue Solu-Medrol .  Check CRP.

## 2023-11-15 NOTE — Assessment & Plan Note (Addendum)
 Patient had a fever of 101 on 8/16.  Repeat blood cultures negative for less than 24 hours.  Previous blood cultures negative for 3 days.  Continue Rocephin  and Zithromax .  MRSA PCR.  Could be complication from COVID infection.  ANC count 1000.  White blood cell count going down to 1.6.

## 2023-11-15 NOTE — Assessment & Plan Note (Signed)
 Continue Coreg 

## 2023-11-15 NOTE — Assessment & Plan Note (Signed)
 -  CPAP at night

## 2023-11-15 NOTE — Assessment & Plan Note (Signed)
 Sugars will likely be higher with steroids.  Continue sliding scale and Semglee  insulin .

## 2023-11-15 NOTE — Assessment & Plan Note (Addendum)
 Hemoglobin stable at 10.0

## 2023-11-15 NOTE — Assessment & Plan Note (Addendum)
 AKI on CKD stage IV.  Creatinine 2.66 with a GFR of 27

## 2023-11-15 NOTE — Hospital Course (Addendum)
 58 y.o. male with a past medical history of renal transplant on immunosuppressants, essential hypertension, insulin -dependent diabetes, coronary artery disease who was in his usual state of health till about 2 to 3 weeks ago when he started developing a cough which was dry to begin with and now has a whitish expectoration.  He developed fevers and had a temperature of 101.3 F a few days ago.  He presented to the emergency department on 11/04/2023 and was diagnosed with COVID-19.  He was given prescription for molnupiravir .  He has completed the course of this antiviral.  However his symptoms did not improve.  He continued to have poor oral intake with loose stools.  He felt dehydrated.  Some shortness of breath was present.  Denies any abdominal pain.  No chest pains.  Since his symptoms were not improving he decided to come back to the hospital.   In the emergency department he underwent blood work which showed mild hyperkalemia with a potassium of 5.2.  Creatinine noted to be 3.48 which is significantly elevated from his baseline.  WBC was noted to be normal.  Lactic acid was normal.  Chest x-ray suggested right lung pneumonia.  Patient will be hospitalized for further management.  8/16.  Patient having a fever of 101.  Still having coughing clear phlegm.  Poor appetite.  Does not want treatment for potassium of 5.3.  States he has been having diarrhea for a long period of time even before he got COVID. 8/17.  Patient feeling a little bit better.  Stool sent off to the lab today.  Blood cultures from yesterday negative for less than 24 hours.  Potassium 5.6 and creatinine 2.66.  Patient okay with Lokelma  will also give sodium bicarb and calcium .

## 2023-11-15 NOTE — Plan of Care (Signed)
  Problem: Education: Goal: Knowledge of risk factors and measures for prevention of condition will improve Outcome: Progressing   Problem: Coping: Goal: Psychosocial and spiritual needs will be supported Outcome: Progressing   Problem: Respiratory: Goal: Will maintain a patent airway Outcome: Progressing Goal: Complications related to the disease process, condition or treatment will be avoided or minimized Outcome: Progressing   Problem: Education: Goal: Ability to describe self-care measures that may prevent or decrease complications (Diabetes Survival Skills Education) will improve Outcome: Progressing Goal: Individualized Educational Video(s) Outcome: Progressing   Problem: Coping: Goal: Ability to adjust to condition or change in health will improve Outcome: Progressing   Problem: Fluid Volume: Goal: Ability to maintain a balanced intake and output will improve Outcome: Progressing   Problem: Health Behavior/Discharge Planning: Goal: Ability to identify and utilize available resources and services will improve Outcome: Progressing Goal: Ability to manage health-related needs will improve Outcome: Progressing   Problem: Metabolic: Goal: Ability to maintain appropriate glucose levels will improve Outcome: Progressing   Problem: Nutritional: Goal: Maintenance of adequate nutrition will improve Outcome: Progressing Goal: Progress toward achieving an optimal weight will improve Outcome: Progressing   Problem: Skin Integrity: Goal: Risk for impaired skin integrity will decrease Outcome: Progressing   Problem: Tissue Perfusion: Goal: Adequacy of tissue perfusion will improve Outcome: Progressing   Problem: Education: Goal: Knowledge of General Education information will improve Description: Including pain rating scale, medication(s)/side effects and non-pharmacologic comfort measures Outcome: Progressing   Problem: Health Behavior/Discharge Planning: Goal:  Ability to manage health-related needs will improve Outcome: Progressing   Problem: Clinical Measurements: Goal: Ability to maintain clinical measurements within normal limits will improve Outcome: Progressing Goal: Will remain free from infection Outcome: Progressing Goal: Diagnostic test results will improve Outcome: Progressing Goal: Respiratory complications will improve Outcome: Progressing Goal: Cardiovascular complication will be avoided Outcome: Progressing   Problem: Activity: Goal: Risk for activity intolerance will decrease Outcome: Progressing   Problem: Nutrition: Goal: Adequate nutrition will be maintained Outcome: Progressing   Problem: Coping: Goal: Level of anxiety will decrease Outcome: Progressing   Problem: Elimination: Goal: Will not experience complications related to bowel motility Outcome: Progressing Goal: Will not experience complications related to urinary retention Outcome: Progressing   Problem: Pain Managment: Goal: General experience of comfort will improve and/or be controlled Outcome: Progressing   Problem: Safety: Goal: Ability to remain free from injury will improve Outcome: Progressing   Problem: Skin Integrity: Goal: Risk for impaired skin integrity will decrease Outcome: Progressing   Problem: Activity: Goal: Ability to tolerate increased activity will improve Outcome: Progressing   Problem: Clinical Measurements: Goal: Ability to maintain a body temperature in the normal range will improve Outcome: Progressing   Problem: Respiratory: Goal: Ability to maintain adequate ventilation will improve Outcome: Progressing Goal: Ability to maintain a clear airway will improve Outcome: Progressing

## 2023-11-15 NOTE — Assessment & Plan Note (Signed)
 Continue Synthroid 

## 2023-11-15 NOTE — Progress Notes (Signed)
   11/15/23 2153  BiPAP/CPAP/SIPAP  BiPAP/CPAP/SIPAP Pt Type Adult  BiPAP/CPAP/SIPAP Resmed  Mask Type Nasal pillows  Dentures removed? Not applicable  FiO2 (%) 21 %  Heater Temperature  (sterile water provided)  Patient Home Machine Yes  Safety Check Completed by RT for Home Unit Yes, no issues noted  Patient Home Mask Yes  Patient Home Tubing Yes  Auto Titrate Yes  Minimum cmH2O 9 cmH2O  Maximum cmH2O 10 cmH2O  Device Plugged into RED Power Outlet Yes

## 2023-11-15 NOTE — Assessment & Plan Note (Addendum)
 Patient on mycophenolate  and tacrolimus .  Tacrolimus  level 6.9.

## 2023-11-15 NOTE — Assessment & Plan Note (Addendum)
 -  Continue sodium bicarb tablets.

## 2023-11-15 NOTE — Assessment & Plan Note (Addendum)
 Sent stools this morning including comprehensive panel, ova or parasites and cryptosporidium.  As needed Imodium .

## 2023-11-15 NOTE — Assessment & Plan Note (Addendum)
 Patient agreeable to Lokelma  today with potassium of 5.6.  Will also give calcium  and sodium bicarb IV.  Continue telemetry today.

## 2023-11-15 NOTE — Progress Notes (Addendum)
 Progress Note   Patient: Tim Becker FMW:[id number] DOB: 08-20-1965 DOA: 11/13/2023     2 DOS: the patient was seen and examined on 11/15/2023   Brief hospital course: 58 y.o. male with a past medical history of renal transplant on immunosuppressants, essential hypertension, insulin -dependent diabetes, coronary artery disease who was in his usual state of health till about 2 to 3 weeks ago when he started developing a cough which was dry to begin with and now has a whitish expectoration.  He developed fevers and had a temperature of 101.3 F a few days ago.  He presented to the emergency department on 11/04/2023 and was diagnosed with COVID-19.  He was given prescription for molnupiravir .  He has completed the course of this antiviral.  However his symptoms did not improve.  He continued to have poor oral intake with loose stools.  He felt dehydrated.  Some shortness of breath was present.  Denies any abdominal pain.  No chest pains.  Since his symptoms were not improving he decided to come back to the hospital.   In the emergency department he underwent blood work which showed mild hyperkalemia with a potassium of 5.2.  Creatinine noted to be 3.48 which is significantly elevated from his baseline.  WBC was noted to be normal.  Lactic acid was normal.  Chest x-ray suggested right lung pneumonia.  Patient will be hospitalized for further management.  8/16.  Patient having a fever of 101.  Still having coughing clear phlegm.  Poor appetite.  Does not want treatment for potassium of 5.3.  States he has been having diarrhea for a long period of time even before he got COVID  Assessment and Plan: * CAP (community acquired pneumonia) Patient had a fever of 101.  Will repeat blood cultures x 2.  Continue Rocephin  and Zithromax .  MRSA PCR.  Could be complication from COVID infection.  COVID-19 virus infection Add Solu-Medrol .  Check CRP.  Acute kidney injury superimposed on CKD (HCC) AKI on CKD stage IV.   Creatinine 3.48 and down to 3.02.  History of renal transplant Patient on mycophenolate  and tacrolimus   Diarrhea Send stools comprehensive panel, ova or parasites and cryptosporidium.  As needed Imodium .  Metabolic acidosis Continue sodium bicarb tablets if CO2 drops no further may need bicarb drip  Type 2 diabetes mellitus with diabetic neuropathy (HCC) Sugars will likely be higher with steroids.  Continue sliding scale and Semglee  insulin .  Essential hypertension Continue Coreg   Hyperkalemia Patient refused Lokelma .  Anemia of chronic disease Hemoglobin stable at 9.7  Hypothyroidism Continue Synthroid   Obstructive sleep apnea CPAP at night        Subjective: Patient still having cough.  Has a fever of 101.  Admitted with pneumonia and prior COVID infection.  Having diarrhea going on even before COVID was diagnosed.  Physical Exam: Vitals:   11/14/23 2121 11/15/23 0456 11/15/23 1204 11/15/23 1406  BP:  (!) 151/64 (!) 154/74 (!) 143/72  Pulse:  87 90 93  Resp: (!) 22 18 20    Temp:  (!) 100.9 F (38.3 C) (!) 101.3 F (38.5 C) 99.2 F (37.3 C)  TempSrc:  Oral Oral Oral  SpO2:    95%   Physical Exam HENT:     Head: Normocephalic.     Mouth/Throat:     Pharynx: No oropharyngeal exudate.  Eyes:     General: Lids are normal.     Conjunctiva/sclera: Conjunctivae normal.  Cardiovascular:     Rate and Rhythm: Normal rate  and regular rhythm.     Heart sounds: Normal heart sounds, S1 normal and S2 normal.  Pulmonary:     Breath sounds: Examination of the right-lower field reveals decreased breath sounds and wheezing. Examination of the left-lower field reveals decreased breath sounds and wheezing. Decreased breath sounds and wheezing present. No rhonchi or rales.  Abdominal:     Palpations: Abdomen is soft.     Tenderness: There is no abdominal tenderness.  Musculoskeletal:     Left lower leg: No swelling.     Right Lower Extremity: Right leg is amputated below  knee.  Skin:    General: Skin is warm.     Findings: No rash.  Neurological:     Mental Status: He is alert and oriented to person, place, and time.     Data Reviewed: Creatinine 3.02, potassium 5.3, CO2 15, white blood cell count 3.7, hemoglobin 9.7, platelet count 191  Disposition: Status is: Inpatient Remains inpatient appropriate because: With fever of 101 we will continue to watch here in the hospital.  Repeat blood cultures.  Continue antibiotics.  Check MRSA PCR.  Planned Discharge Destination: Home    Time spent: 28 minutes  Author: Charlie Patterson, MD 11/15/2023 4:23 PM  For on call review [email or web address].

## 2023-11-16 DIAGNOSIS — J189 Pneumonia, unspecified organism: Secondary | ICD-10-CM | POA: Diagnosis not present

## 2023-11-16 DIAGNOSIS — U071 COVID-19: Secondary | ICD-10-CM | POA: Diagnosis not present

## 2023-11-16 DIAGNOSIS — N179 Acute kidney failure, unspecified: Secondary | ICD-10-CM | POA: Diagnosis not present

## 2023-11-16 DIAGNOSIS — D638 Anemia in other chronic diseases classified elsewhere: Secondary | ICD-10-CM

## 2023-11-16 DIAGNOSIS — E875 Hyperkalemia: Secondary | ICD-10-CM | POA: Diagnosis not present

## 2023-11-16 LAB — GASTROINTESTINAL PANEL BY PCR, STOOL (REPLACES STOOL CULTURE)

## 2023-11-16 LAB — BASIC METABOLIC PANEL WITH GFR
Anion gap: 8 (ref 5–15)
BUN: 60 mg/dL — ABNORMAL HIGH (ref 6–20)
CO2: 16 mmol/L — ABNORMAL LOW (ref 22–32)
Calcium: 8.4 mg/dL — ABNORMAL LOW (ref 8.9–10.3)
Chloride: 108 mmol/L (ref 98–111)
Creatinine, Ser: 2.66 mg/dL — ABNORMAL HIGH (ref 0.61–1.24)
GFR, Estimated: 27 mL/min — ABNORMAL LOW (ref >60)
Glucose, Bld: 246 mg/dL — ABNORMAL HIGH (ref 70–99)
Potassium: 5.6 mmol/L — ABNORMAL HIGH (ref 3.5–5.1)
Sodium: 132 mmol/L — ABNORMAL LOW (ref 135–145)

## 2023-11-16 LAB — CBC WITH DIFFERENTIAL/PLATELET
Abs Immature Granulocytes: 0.04 K/uL (ref 0.00–0.07)
Basophils Absolute: 0 K/uL (ref 0.0–0.1)
Basophils Relative: 0 %
Eosinophils Absolute: 0 K/uL (ref 0.0–0.5)
Eosinophils Relative: 0 %
HCT: 35.3 % — ABNORMAL LOW (ref 39.0–52.0)
Hemoglobin: 10 g/dL — ABNORMAL LOW (ref 13.0–17.0)
Immature Granulocytes: 3 %
Lymphocytes Relative: 9 %
Lymphs Abs: 0.2 K/uL — ABNORMAL LOW (ref 0.7–4.0)
MCH: 23.8 pg — ABNORMAL LOW (ref 26.0–34.0)
MCHC: 28.3 g/dL — ABNORMAL LOW (ref 30.0–36.0)
MCV: 84 fL (ref 80.0–100.0)
Monocytes Absolute: 0.2 K/uL (ref 0.1–1.0)
Monocytes Relative: 14 %
Neutro Abs: 1.2 K/uL — ABNORMAL LOW (ref 1.7–7.7)
Neutrophils Relative %: 74 %
Platelets: 185 K/uL (ref 150–400)
RBC: 4.2 MIL/uL — ABNORMAL LOW (ref 4.22–5.81)
RDW: 14.1 % (ref 11.5–15.5)
WBC: 1.6 K/uL — ABNORMAL LOW (ref 4.0–10.5)
nRBC: 0 % (ref 0.0–0.2)

## 2023-11-16 LAB — GLUCOSE, CAPILLARY
Glucose-Capillary: 205 mg/dL — ABNORMAL HIGH (ref 70–99)
Glucose-Capillary: 197 mg/dL — ABNORMAL HIGH (ref 70–99)
Glucose-Capillary: 225 mg/dL — ABNORMAL HIGH (ref 70–99)
Glucose-Capillary: 335 mg/dL — ABNORMAL HIGH (ref 70–99)

## 2023-11-16 LAB — LEGIONELLA PNEUMOPHILA SEROGP 1 UR AG: L. pneumophila Serogp 1 Ur Ag: NEGATIVE

## 2023-11-16 LAB — C-REACTIVE PROTEIN: CRP: 1.6 mg/dL — ABNORMAL HIGH (ref <1.0)

## 2023-11-16 LAB — MRSA NEXT GEN BY PCR, NASAL: MRSA by PCR Next Gen: DETECTED — AB

## 2023-11-16 MED ORDER — INSULIN GLARGINE-YFGN 100 UNIT/ML ~~LOC~~ SOLN
20.0000 [IU] | Freq: Every day | SUBCUTANEOUS | Status: DC
  Administered 2023-11-17: 20 [IU] via SUBCUTANEOUS
  Filled 2023-11-16: qty 0.2

## 2023-11-16 MED ORDER — DOXYCYCLINE HYCLATE 100 MG PO TABS: 100.0000 mg | ORAL_TABLET | Freq: Two times a day (BID) | ORAL | Status: DC

## 2023-11-16 MED ORDER — CALCIUM GLUCONATE-NACL 1-0.675 GM/50ML-% IV SOLN
1.0000 g | Freq: Once | INTRAVENOUS | Status: AC
  Administered 2023-11-16: 1000 mg via INTRAVENOUS
  Filled 2023-11-16: qty 50

## 2023-11-16 MED ORDER — SODIUM BICARBONATE 8.4 % IV SOLN
INTRAVENOUS | Status: AC
  Filled 2023-11-16: qty 50

## 2023-11-16 MED ORDER — SODIUM BICARBONATE 8.4 % IV SOLN
25.0000 meq | Freq: Once | INTRAVENOUS | Status: AC
  Administered 2023-11-16: 25 meq via INTRAVENOUS
  Filled 2023-11-16: qty 50

## 2023-11-16 MED ORDER — SODIUM ZIRCONIUM CYCLOSILICATE 10 G PO PACK
10.0000 g | PACK | Freq: Once | ORAL | Status: AC
  Administered 2023-11-16: 10 g via ORAL
  Filled 2023-11-16: qty 1

## 2023-11-16 MED ORDER — LINEZOLID 600 MG PO TABS
600.0000 mg | ORAL_TABLET | Freq: Two times a day (BID) | ORAL | Status: DC
  Administered 2023-11-16 – 2023-11-17 (×2): 600 mg via ORAL
  Filled 2023-11-16 (×2): qty 1

## 2023-11-16 NOTE — Plan of Care (Signed)
   Problem: Education: Goal: Knowledge of risk factors and measures for prevention of condition will improve Outcome: Progressing   Problem: Respiratory: Goal: Will maintain a patent airway Outcome: Progressing

## 2023-11-16 NOTE — Progress Notes (Signed)
 Progress Note   Patient: Tim Becker FMW:[id number] DOB: 1965/09/27 DOA: 11/13/2023     3 DOS: the patient was seen and examined on 11/16/2023   Brief hospital course: 58 y.o. male with a past medical history of renal transplant on immunosuppressants, essential hypertension, insulin -dependent diabetes, coronary artery disease who was in his usual state of health till about 2 to 3 weeks ago when he started developing a cough which was dry to begin with and now has a whitish expectoration.  He developed fevers and had a temperature of 101.3 F a few days ago.  He presented to the emergency department on 11/04/2023 and was diagnosed with COVID-19.  He was given prescription for molnupiravir .  He has completed the course of this antiviral.  However his symptoms did not improve.  He continued to have poor oral intake with loose stools.  He felt dehydrated.  Some shortness of breath was present.  Denies any abdominal pain.  No chest pains.  Since his symptoms were not improving he decided to come back to the hospital.   In the emergency department he underwent blood work which showed mild hyperkalemia with a potassium of 5.2.  Creatinine noted to be 3.48 which is significantly elevated from his baseline.  WBC was noted to be normal.  Lactic acid was normal.  Chest x-ray suggested right lung pneumonia.  Patient will be hospitalized for further management.  8/16.  Patient having a fever of 101.  Still having coughing clear phlegm.  Poor appetite.  Does not want treatment for potassium of 5.3.  States he has been having diarrhea for a long period of time even before he got COVID. 8/17.  Patient feeling a little bit better.  Stool sent off to the lab today.  Blood cultures from yesterday negative for less than 24 hours.  Potassium 5.6 and creatinine 2.66.  Patient okay with Lokelma  will also give sodium bicarb and calcium .  Assessment and Plan: * CAP (community acquired pneumonia) Patient had a fever of 101 on  8/16.  Repeat blood cultures negative for less than 24 hours.  Previous blood cultures negative for 3 days.  Continue Rocephin  and Zithromax .  MRSA PCR.  Could be complication from COVID infection.  ANC count 1000.  White blood cell count going down to 1.6.  COVID-19 virus infection Continue Solu-Medrol .  Check CRP.  Hyperkalemia Patient agreeable to Lokelma  today with potassium of 5.6.  Will also give calcium  and sodium bicarb IV.  Continue telemetry today.  Acute kidney injury superimposed on CKD (HCC) AKI on CKD stage IV.  Creatinine 2.66 with a GFR of 27  History of renal transplant Patient on mycophenolate  and tacrolimus .  Tacrolimus  level 6.9.  Diarrhea Sent stools this morning including comprehensive panel, ova or parasites and cryptosporidium.  As needed Imodium .  Metabolic acidosis Continue sodium bicarb tablets.  Type 2 diabetes mellitus with diabetic neuropathy (HCC) Sugars will likely be higher with steroids.  Continue sliding scale and Semglee  insulin .  Essential hypertension Continue Coreg   Anemia of chronic disease Hemoglobin stable at 10.0.  Hypothyroidism Continue Synthroid   Obstructive sleep apnea CPAP at night        Subjective: Patient feeling little bit better.  Appetite a little bit better.  States he felt better once his sugar came up.  Still with some cough.  Admitted with pneumonia after COVID infection.  Physical Exam: Vitals:   11/15/23 2045 11/16/23 0558 11/16/23 0817 11/16/23 1109  BP: 138/74 (!) 142/95 (!) 157/75 129/73  Pulse: 93 83 85   Resp: 19 18    Temp: 98.7 F (37.1 C) 98.3 F (36.8 C)    TempSrc: Oral Oral    SpO2: 95% 100%     Physical Exam HENT:     Head: Normocephalic.     Mouth/Throat:     Pharynx: No oropharyngeal exudate.  Eyes:     General: Lids are normal.     Conjunctiva/sclera: Conjunctivae normal.  Cardiovascular:     Rate and Rhythm: Normal rate and regular rhythm.     Heart sounds: Normal heart sounds,  S1 normal and S2 normal.  Pulmonary:     Breath sounds: Examination of the right-lower field reveals decreased breath sounds. Examination of the left-lower field reveals decreased breath sounds. Decreased breath sounds present. No wheezing, rhonchi or rales.  Abdominal:     Palpations: Abdomen is soft.     Tenderness: There is no abdominal tenderness.  Musculoskeletal:     Left lower leg: No swelling.     Right Lower Extremity: Right leg is amputated below knee.  Skin:    General: Skin is warm.     Findings: No rash.  Neurological:     Mental Status: He is alert and oriented to person, place, and time.     Data Reviewed: Sodium 132, potassium 5.6, creatinine 2.66, GFR 27, CO2 16, white blood count 1.6, hemoglobin 10, platelet count 185  Family Communication: Declined  Disposition: Status is: Inpatient Remains inpatient appropriate because: Had a fever of 101 yesterday.  Stool studies sent off today.  Planned Discharge Destination: Home    Time spent: 28 minutes  Author: Charlie Patterson, MD 11/16/2023 11:37 AM  For on call review [email or web address].

## 2023-11-16 NOTE — Plan of Care (Signed)
  Problem: Education: Goal: Knowledge of risk factors and measures for prevention of condition will improve Outcome: Progressing   Problem: Coping: Goal: Psychosocial and spiritual needs will be supported Outcome: Progressing   Problem: Respiratory: Goal: Will maintain a patent airway Outcome: Progressing Goal: Complications related to the disease process, condition or treatment will be avoided or minimized Outcome: Progressing   Problem: Education: Goal: Ability to describe self-care measures that may prevent or decrease complications (Diabetes Survival Skills Education) will improve Outcome: Progressing Goal: Individualized Educational Video(s) Outcome: Progressing   Problem: Coping: Goal: Ability to adjust to condition or change in health will improve Outcome: Progressing   Problem: Fluid Volume: Goal: Ability to maintain a balanced intake and output will improve Outcome: Progressing   Problem: Health Behavior/Discharge Planning: Goal: Ability to identify and utilize available resources and services will improve Outcome: Progressing Goal: Ability to manage health-related needs will improve Outcome: Progressing   Problem: Metabolic: Goal: Ability to maintain appropriate glucose levels will improve Outcome: Progressing   Problem: Nutritional: Goal: Maintenance of adequate nutrition will improve Outcome: Progressing Goal: Progress toward achieving an optimal weight will improve Outcome: Progressing   Problem: Skin Integrity: Goal: Risk for impaired skin integrity will decrease Outcome: Progressing   Problem: Tissue Perfusion: Goal: Adequacy of tissue perfusion will improve Outcome: Progressing   Problem: Education: Goal: Knowledge of General Education information will improve Description: Including pain rating scale, medication(s)/side effects and non-pharmacologic comfort measures Outcome: Progressing   Problem: Health Behavior/Discharge Planning: Goal:  Ability to manage health-related needs will improve Outcome: Progressing   Problem: Clinical Measurements: Goal: Ability to maintain clinical measurements within normal limits will improve Outcome: Progressing Goal: Will remain free from infection Outcome: Progressing Goal: Diagnostic test results will improve Outcome: Progressing Goal: Respiratory complications will improve Outcome: Progressing Goal: Cardiovascular complication will be avoided Outcome: Progressing   Problem: Activity: Goal: Risk for activity intolerance will decrease Outcome: Progressing   Problem: Nutrition: Goal: Adequate nutrition will be maintained Outcome: Progressing   Problem: Coping: Goal: Level of anxiety will decrease Outcome: Progressing   Problem: Elimination: Goal: Will not experience complications related to bowel motility Outcome: Progressing Goal: Will not experience complications related to urinary retention Outcome: Progressing   Problem: Pain Managment: Goal: General experience of comfort will improve and/or be controlled Outcome: Progressing   Problem: Safety: Goal: Ability to remain free from injury will improve Outcome: Progressing   Problem: Skin Integrity: Goal: Risk for impaired skin integrity will decrease Outcome: Progressing   Problem: Activity: Goal: Ability to tolerate increased activity will improve Outcome: Progressing   Problem: Clinical Measurements: Goal: Ability to maintain a body temperature in the normal range will improve Outcome: Progressing   Problem: Respiratory: Goal: Ability to maintain adequate ventilation will improve Outcome: Progressing Goal: Ability to maintain a clear airway will improve Outcome: Progressing

## 2023-11-17 ENCOUNTER — Other Ambulatory Visit (HOSPITAL_COMMUNITY): Payer: Self-pay

## 2023-11-17 DIAGNOSIS — J189 Pneumonia, unspecified organism: Secondary | ICD-10-CM | POA: Diagnosis not present

## 2023-11-17 LAB — BASIC METABOLIC PANEL WITH GFR
Anion gap: 9 (ref 5–15)
BUN: 65 mg/dL — ABNORMAL HIGH (ref 6–20)
CO2: 18 mmol/L — ABNORMAL LOW (ref 22–32)
Calcium: 9.1 mg/dL (ref 8.9–10.3)
Chloride: 111 mmol/L (ref 98–111)
Creatinine, Ser: 2.62 mg/dL — ABNORMAL HIGH (ref 0.61–1.24)
GFR, Estimated: 27 mL/min — ABNORMAL LOW (ref >60)
Glucose, Bld: 304 mg/dL — ABNORMAL HIGH (ref 70–99)
Potassium: 5.4 mmol/L — ABNORMAL HIGH (ref 3.5–5.1)
Sodium: 138 mmol/L (ref 135–145)

## 2023-11-17 LAB — GLUCOSE, CAPILLARY: Glucose-Capillary: 229 mg/dL — ABNORMAL HIGH (ref 70–99)

## 2023-11-17 MED ORDER — PREDNISONE 20 MG PO TABS
20.0000 mg | ORAL_TABLET | Freq: Every day | ORAL | 0 refills | Status: AC
  Filled 2023-11-17: qty 5, 5d supply, fill #0

## 2023-11-17 MED ORDER — PREDNISONE 20 MG PO TABS: 20.0000 mg | ORAL_TABLET | Freq: Every day | ORAL | Status: DC

## 2023-11-17 MED ORDER — SODIUM ZIRCONIUM CYCLOSILICATE 10 G PO PACK
10.0000 g | PACK | Freq: Once | ORAL | Status: AC
  Administered 2023-11-17: 10 g via ORAL
  Filled 2023-11-17: qty 1

## 2023-11-17 MED ORDER — AMOXICILLIN-POT CLAVULANATE 875-125 MG PO TABS
1.0000 | ORAL_TABLET | Freq: Two times a day (BID) | ORAL | 0 refills | Status: AC
  Filled 2023-11-17: qty 8, 4d supply, fill #0

## 2023-11-17 MED ORDER — AMOXICILLIN-POT CLAVULANATE 875-125 MG PO TABS
1.0000 | ORAL_TABLET | Freq: Two times a day (BID) | ORAL | Status: DC
  Administered 2023-11-17: 1 via ORAL
  Filled 2023-11-17: qty 1

## 2023-11-17 NOTE — Progress Notes (Signed)
   11/16/23 2300  BiPAP/CPAP/SIPAP  BiPAP/CPAP/SIPAP Pt Type Adult  BiPAP/CPAP/SIPAP Resmed  Mask Type Nasal pillows  Dentures removed? Not applicable  FiO2 (%) 21 %  Patient Home Machine Yes  Safety Check Completed by RT for Home Unit Yes, no issues noted  Patient Home Mask Yes  Patient Home Tubing Yes  Auto Titrate Yes  Device Plugged into RED Power Outlet Yes  BiPAP/CPAP /SiPAP Vitals  Resp 18  MEWS Score/Color  MEWS Score 0  MEWS Score Color Green

## 2023-11-17 NOTE — Discharge Summary (Signed)
 Physician Discharge Summary  Tim Becker FMW:[id number] DOB: [birth year] DOA: 11/13/2023  PCP: Leonarda Roxan BROCKS, NP  Admit date: 11/13/2023 Discharge date: 11/17/2023  Admitted From: Home Disposition:  Home  Discharge Condition:Stable CODE STATUS:FULL, DNR, Comfort Care Diet recommendation: Heart Healthy / Carb Modified / Regular / Dysphagia   Brief/Interim Summary:   Following problems were addressed during the hospitalization:   Discharge Diagnoses:  Principal Problem:   CAP (community acquired pneumonia) Active Problems:   COVID-19 virus infection   Acute kidney injury superimposed on CKD (HCC)   Hyperkalemia   History of renal transplant   Diarrhea   Type 2 diabetes mellitus with diabetic neuropathy (HCC)   Metabolic acidosis   Essential hypertension   Anemia of chronic disease   Hypothyroidism   Obstructive sleep apnea    Discharge Instructions  Discharge Instructions     Diet Carb Modified   Complete by: As directed    Discharge instructions   Complete by: As directed    1)Please take prescribed medications as instructed 2)Follow up with your PCP and  your nephrologist in 1 to 2 weeks.  Do a CBC and BMP tests in a week   Increase activity slowly   Complete by: As directed       Allergies as of 11/17/2023   No Known Allergies      Medication List     STOP taking these medications    Potassium Citrate 15 MEQ (1620 MG) Tbcr   spironolactone 25 MG tablet Commonly known as: ALDACTONE       TAKE these medications    acetaminophen  500 MG tablet Commonly known as: TYLENOL  Take 500 mg by mouth every 6 (six) hours as needed for moderate pain or mild pain.   allopurinol  100 MG tablet Commonly known as: ZYLOPRIM  Take 100 mg by mouth every other day.   amLODipine  10 MG tablet Commonly known as: NORVASC  Take 10 mg by mouth in the morning. 9am   amoxicillin -clavulanate 875-125 MG tablet Commonly known as: AUGMENTIN  Take 1 tablet by mouth every  12 (twelve) hours for 4 days.   aspirin  EC 81 MG tablet Take 81 mg by mouth daily. Swallow whole.   atorvastatin  40 MG tablet Commonly known as: LIPITOR Take 40 mg by mouth daily. 9pm.   carvedilol  12.5 MG tablet Commonly known as: COREG  Take 37.5 mg by mouth in the morning, at noon, and at bedtime. 9am , and 9pm What changed: when to take this   chlorthalidone 25 MG tablet Commonly known as: HYGROTON Take 25 mg by mouth daily.   furosemide 80 MG tablet Commonly known as: LASIX Take 80 mg by mouth as needed for edema or fluid.   gabapentin 100 MG capsule Commonly known as: NEURONTIN Take 100 mg by mouth 2 (two) times daily.   hydrALAZINE  100 MG tablet Commonly known as: APRESOLINE  Take 100 mg by mouth 2 (two) times daily.   insulin  lispro 100 UNIT/ML KwikPen Commonly known as: HUMALOG  INJECT 3-17 UNITS INTO THE SKIN 2 TIMES PER DAY AS NEEDED FOR HIGH BLOOD SUGAR   Jardiance 10 MG Tabs tablet Generic drug: empagliflozin Take 10 mg by mouth daily.   Lantus  SoloStar 100 UNIT/ML Solostar Pen Generic drug: insulin  glargine Inject 35 Units into the skin daily. INJECT 35 UNITS SUBCUTANEOUSLY ONCE DAILY BEFORE BREAKFAST   levothyroxine  75 MCG tablet Commonly known as: SYNTHROID  Take 75 mcg by mouth daily before breakfast. 7am   mycophenolate  180 MG EC tablet Commonly known as: MYFORTIC  Take  3 tablets (540 mg total) by mouth in the morning and at bedtime. 3 tablets (540 mg total per dose) at 9am and 9pm   Ozempic  (2 MG/DOSE) 8 MG/3ML Sopn Generic drug: Semaglutide  (2 MG/DOSE) Inject 2 mg into the skin once a week. Start taking on: November 27, 2023   predniSONE  20 MG tablet Commonly known as: DELTASONE  Take 1 tablet (20 mg total) by mouth daily with breakfast for 5 days. Start taking on: November 18, 2023   tacrolimus  ER 1 MG Tb24 Commonly known as: ENVARSUS  XR Take 5 mg by mouth daily before breakfast.   triamcinolone  cream 0.1 % Commonly known as: KENALOG  Apply  1 Application topically 2 (two) times daily. Apply to affected areas on abdomen What changed:  when to take this reasons to take this additional instructions        Follow-up Information     Ngetich, Dinah C, NP. Schedule an appointment as soon as possible for a visit in 1 week(s).   Specialty: Family Medicine Contact information: [address] [place] [address] [phone]                No Known Allergies  Consultations:    Procedures/Studies: DG Chest 2 View Result Date: 11/13/2023 CLINICAL DATA:  cough. EXAM: CHEST - 2 VIEW COMPARISON:  11/04/2023. FINDINGS: There is heterogeneous opacity overlying the right lateral costophrenic angle, which may represent focal pneumonia. There are probable atelectatic changes at the left lung base. No dense consolidation or lung collapse. Bilateral costophrenic angles are clear. Normal cardio-mediastinal silhouette. No acute osseous abnormalities. The soft tissues are within normal limits. IMPRESSION: *Heterogeneous opacity overlying the right lateral costophrenic angle, which may represent focal pneumonia. Follow-up to clearing is recommended. Electronically Signed   By: Ree Molt M.D.   On: 11/13/2023 15:17   DG Chest 2 View Result Date: 11/04/2023 CLINICAL DATA:  Cough EXAM: CHEST - 2 VIEW COMPARISON:  Chest radiograph March 22, 2022 FINDINGS: The heart size and mediastinal contours are within normal limits. Both lungs are clear. No suspicious consolidation. (Previously seen left lung base opacity on comparison exam is resolved). Elevation of left hemidiaphragm. The visualized skeletal structures are unremarkable. IMPRESSION: No active cardiopulmonary disease. Electronically Signed   By: Megan  Zare M.D.   On: 11/04/2023 12:51      Subjective:   Discharge Exam: Vitals:   11/17/23 0955 11/17/23 1034  BP: (!) 156/79 (!) 156/79  Pulse:    Resp:    Temp:    SpO2:     Vitals:   11/17/23 0507 11/17/23 0814 11/17/23  0955 11/17/23 1034  BP: (!) 165/80 (!) 156/79 (!) 156/79 (!) 156/79  Pulse: 82 83    Resp: 18 14    Temp: 97.8 F (36.6 C) 98.3 F (36.8 C)    TempSrc: Oral     SpO2:        General: Pt is alert, awake, not in acute distress Cardiovascular: RRR, S1/S2 +, no rubs, no gallops Respiratory: CTA bilaterally, no wheezing, no rhonchi Abdominal: Soft, NT, ND, bowel sounds + Extremities: no edema, no cyanosis    The results of significant diagnostics from this hospitalization (including imaging, microbiology, ancillary and laboratory) are listed below for reference.     Microbiology: Recent Results (from the past 240 hours)  Culture, blood (routine x 2)     Status: None (Preliminary result)   Collection Time: 11/13/23  5:22 PM   Specimen: BLOOD RIGHT HAND  Result Value Ref Range  Status   Specimen Description   Final    BLOOD RIGHT HAND Performed at [facility], [address] [place], [place] [address]    Special Requests   Final    BOTTLES DRAWN AEROBIC AND ANAEROBIC Blood Culture results may not be optimal due to an inadequate volume of blood received in culture bottles Performed at [facility], [address], [place], [place] [address]    Culture   Final    NO GROWTH 4 DAYS Performed at [facility], [address] [place], [place] [address]    Report Status PENDING  Incomplete  Culture, blood (routine x 2)     Status: None (Preliminary result)   Collection Time: 11/13/23  5:34 PM   Specimen: BLOOD RIGHT WRIST  Result Value Ref Range Status   Specimen Description   Final    BLOOD RIGHT WRIST Performed at [facility], [address], [place], [place] [address]    Special Requests   Final    BOTTLES DRAWN AEROBIC AND ANAEROBIC Blood Culture results may not be optimal due to an inadequate volume of blood received in culture bottles Performed at [facility], [address] [place], [place] [address]    Culture    Final    NO GROWTH 4 DAYS Performed at [facility], [address], [place], [place] [address]    Report Status PENDING  Incomplete  Gastrointestinal Panel by PCR , Stool     Status: Abnormal   Collection Time: 11/15/23  9:52 AM   Specimen: Stool  Result Value Ref Range Status   Campylobacter species NOT DETECTED NOT DETECTED Final   Plesimonas shigelloides NOT DETECTED NOT DETECTED Final   Salmonella species NOT DETECTED NOT DETECTED Final   Yersinia enterocolitica NOT DETECTED NOT DETECTED Final   Vibrio species NOT DETECTED NOT DETECTED Final   Vibrio cholerae NOT DETECTED NOT DETECTED Final   Enteroaggregative E coli (EAEC) NOT DETECTED NOT DETECTED Final   Enteropathogenic E coli (EPEC) NOT DETECTED NOT DETECTED Final   Enterotoxigenic E coli (ETEC) NOT DETECTED NOT DETECTED Final   Shiga like toxin producing E coli (STEC) NOT DETECTED NOT DETECTED Final   Shigella/Enteroinvasive E coli (EIEC) NOT DETECTED NOT DETECTED Final   Cryptosporidium NOT DETECTED NOT DETECTED Final   Cyclospora cayetanensis NOT DETECTED NOT DETECTED Final   Entamoeba histolytica NOT DETECTED NOT DETECTED Final   Giardia lamblia NOT DETECTED NOT DETECTED Final   Adenovirus F40/41 NOT DETECTED NOT DETECTED Final   Astrovirus NOT DETECTED NOT DETECTED Final   Norovirus GI/GII DETECTED (A) NOT DETECTED Final    Comment: RESULT CALLED TO, READ BACK BY AND VERIFIED WITH: ONEAL FABIENE GAINER, RN @2235  11/16/2023 COP    Rotavirus A NOT DETECTED NOT DETECTED Final   Sapovirus (I, II, IV, and V) NOT DETECTED NOT DETECTED Final    Comment: Performed at [facility], [address] [place], [place] [address]  Culture, blood (Routine X 2) w Reflex to ID Panel     Status: None (Preliminary result)   Collection Time: 11/15/23  1:16 PM   Specimen: BLOOD RIGHT ARM  Result Value Ref Range Status   Specimen Description   Final    BLOOD RIGHT ARM Performed at [facility], [address]  [place], [place] [address]    Special Requests   Final    BOTTLES DRAWN AEROBIC AND ANAEROBIC Blood Culture results  may not be optimal due to an inadequate volume of blood received in culture bottles Performed at [facility], [address] [place], [place] [address]    Culture   Final    NO GROWTH 2 DAYS Performed at [facility], [address], [place], [place] [address]    Report Status PENDING  Incomplete  Culture, blood (Routine X 2) w Reflex to ID Panel     Status: None (Preliminary result)   Collection Time: 11/15/23  1:16 PM   Specimen: BLOOD RIGHT HAND  Result Value Ref Range Status   Specimen Description   Final    BLOOD RIGHT HAND Performed at [facility], [address], [place], [place] [address]    Special Requests   Final    BOTTLES DRAWN AEROBIC AND ANAEROBIC Blood Culture results may not be optimal due to an inadequate volume of blood received in culture bottles Performed at [facility], [address], [place], [place] [address]    Culture   Final    NO GROWTH 2 DAYS Performed at [facility], [address] [place], [place] [address]    Report Status PENDING  Incomplete  MRSA Next Gen by PCR, Nasal     Status: Abnormal   Collection Time: 11/16/23  8:30 AM   Specimen: Nasal Mucosa; Nasal Swab  Result Value Ref Range Status   MRSA by PCR Next Gen DETECTED (A) NOT DETECTED Final    Comment: (NOTE) The GeneXpert MRSA Assay (FDA approved for NASAL specimens only), is one component of a comprehensive MRSA colonization surveillance program. It is not intended to diagnose MRSA infection nor to guide or monitor treatment for MRSA infections. Test performance is not FDA approved in patients less than 16 years old. Performed at [facility], [address], [place], [place] [address]      Labs: BNP (last 3 results) No results for input(s): BNP in the last 8760 hours. Basic Metabolic  Panel: Recent Labs  Lab 11/13/23 1447 11/14/23 0447 11/15/23 0535 11/16/23 0540 11/17/23 0503  NA 136 136 135 132* 138  K 5.2* 4.8 5.3* 5.6* 5.4*  CL 108 111 112* 108 111  CO2 19* 17* 15* 16* 18*  GLUCOSE 103* 87 87 246* 304*  BUN 76* 67* 62* 60* 65*  CREATININE 3.48* 3.30* 3.02* 2.66* 2.62*  CALCIUM  8.7* 8.2* 8.3* 8.4* 9.1   Liver Function Tests: Recent Labs  Lab 11/13/23 1447  AST 17  ALT 23  ALKPHOS 95  BILITOT 0.9  PROT 6.6  ALBUMIN 3.6   No results for input(s): LIPASE, AMYLASE in the last 168 hours. No results for input(s): AMMONIA in the last 168 hours. CBC: Recent Labs  Lab 11/13/23 1447 11/14/23 0447 11/15/23 0535 11/16/23 0540  WBC 4.5 3.7* 3.7* 1.6*  NEUTROABS 3.4  --   --  1.2*  HGB 10.0* 9.3* 9.7* 10.0*  HCT 34.0* 31.9* 33.9* 35.3*  MCV 84.2 85.8 84.8 84.0  PLT 196 172 191 185   Cardiac Enzymes: No results for input(s): CKTOTAL, CKMB, CKMBINDEX, TROPONINI in the last 168 hours. BNP: Invalid input(s): POCBNP CBG: Recent Labs  Lab 11/16/23 0745 11/16/23 1140 11/16/23 1637 11/16/23 2122 11/17/23 0809  GLUCAP 205* 197* 225* 335* 229*   D-Dimer No results for input(s): DDIMER in the last 72 hours. Hgb A1c No results for input(s): HGBA1C in the last 72 hours. Lipid Profile No results for input(s): CHOL, HDL, LDLCALC,  TRIG, CHOLHDL, LDLDIRECT in the last 72 hours. Thyroid  function studies No results for input(s): TSH, T4TOTAL, T3FREE, THYROIDAB in the last 72 hours.  Invalid input(s): FREET3 Anemia work up No results for input(s): VITAMINB12, FOLATE, FERRITIN, TIBC, IRON , RETICCTPCT in the last 72 hours. Urinalysis    Component Value Date/Time   COLORURINE YELLOW 11/13/2023 1859   APPEARANCEUR CLEAR 11/13/2023 1859   LABSPEC 1.016 11/13/2023 1859   PHURINE 5.0 11/13/2023 1859   GLUCOSEU 50 (A) 11/13/2023 1859   HGBUR NEGATIVE 11/13/2023 1859   BILIRUBINUR NEGATIVE 11/13/2023 1859    KETONESUR NEGATIVE 11/13/2023 1859   PROTEINUR NEGATIVE 11/13/2023 1859   NITRITE NEGATIVE 11/13/2023 1859   LEUKOCYTESUR NEGATIVE 11/13/2023 1859   Sepsis Labs Recent Labs  Lab 11/13/23 1447 11/14/23 0447 11/15/23 0535 11/16/23 0540  WBC 4.5 3.7* 3.7* 1.6*   Microbiology Recent Results (from the past 240 hours)  Culture, blood (routine x 2)     Status: None (Preliminary result)   Collection Time: 11/13/23  5:22 PM   Specimen: BLOOD RIGHT HAND  Result Value Ref Range Status   Specimen Description   Final    BLOOD RIGHT HAND Performed at [facility], [address] [place], [place] [address]    Special Requests   Final    BOTTLES DRAWN AEROBIC AND ANAEROBIC Blood Culture results may not be optimal due to an inadequate volume of blood received in culture bottles Performed at [facility], [address] [place], [place] [address]    Culture   Final    NO GROWTH 4 DAYS Performed at [facility], [address] [place], [place] [address]    Report Status PENDING  Incomplete  Culture, blood (routine x 2)     Status: None (Preliminary result)   Collection Time: 11/13/23  5:34 PM   Specimen: BLOOD RIGHT WRIST  Result Value Ref Range Status   Specimen Description   Final    BLOOD RIGHT WRIST Performed at [facility], [address] [place], [place] [address]    Special Requests   Final    BOTTLES DRAWN AEROBIC AND ANAEROBIC Blood Culture results may not be optimal due to an inadequate volume of blood received in culture bottles Performed at [facility], [address], [place], [place] [address]    Culture   Final    NO GROWTH 4 DAYS Performed at [facility], [address] [place], [place] [address]    Report Status PENDING  Incomplete  Gastrointestinal Panel by PCR , Stool     Status: Abnormal   Collection Time: 11/15/23  9:52 AM   Specimen: Stool  Result Value Ref Range Status   Campylobacter  species NOT DETECTED NOT DETECTED Final   Plesimonas shigelloides NOT DETECTED NOT DETECTED Final   Salmonella species NOT DETECTED NOT DETECTED Final   Yersinia enterocolitica NOT DETECTED NOT DETECTED Final   Vibrio species NOT DETECTED NOT DETECTED Final   Vibrio cholerae NOT DETECTED NOT DETECTED Final   Enteroaggregative E coli (EAEC) NOT DETECTED NOT DETECTED Final   Enteropathogenic E coli (EPEC) NOT DETECTED NOT DETECTED Final   Enterotoxigenic E coli (ETEC) NOT DETECTED NOT DETECTED Final   Shiga like toxin producing E coli (STEC) NOT DETECTED NOT DETECTED Final   Shigella/Enteroinvasive E coli (EIEC) NOT DETECTED NOT DETECTED Final   Cryptosporidium NOT DETECTED NOT DETECTED Final   Cyclospora cayetanensis NOT DETECTED NOT DETECTED Final  Entamoeba histolytica NOT DETECTED NOT DETECTED Final   Giardia lamblia NOT DETECTED NOT DETECTED Final   Adenovirus F40/41 NOT DETECTED NOT DETECTED Final   Astrovirus NOT DETECTED NOT DETECTED Final   Norovirus GI/GII DETECTED (A) NOT DETECTED Final    Comment: RESULT CALLED TO, READ BACK BY AND VERIFIED WITH: ONEAL FABIENE GAINER, RN @2235  11/16/2023 COP    Rotavirus A NOT DETECTED NOT DETECTED Final   Sapovirus (I, II, IV, and V) NOT DETECTED NOT DETECTED Final    Comment: Performed at [facility], [address] [place], [place] [address]  Culture, blood (Routine X 2) w Reflex to ID Panel     Status: None (Preliminary result)   Collection Time: 11/15/23  1:16 PM   Specimen: BLOOD RIGHT ARM  Result Value Ref Range Status   Specimen Description   Final    BLOOD RIGHT ARM Performed at [facility], [address], [place], [place] [address]    Special Requests   Final    BOTTLES DRAWN AEROBIC AND ANAEROBIC Blood Culture results may not be optimal due to an inadequate volume of blood received in culture bottles Performed at [facility], [address], [place], [place] [address]    Culture   Final     NO GROWTH 2 DAYS Performed at [facility], [address], [place], [place] [address]    Report Status PENDING  Incomplete  Culture, blood (Routine X 2) w Reflex to ID Panel     Status: None (Preliminary result)   Collection Time: 11/15/23  1:16 PM   Specimen: BLOOD RIGHT HAND  Result Value Ref Range Status   Specimen Description   Final    BLOOD RIGHT HAND Performed at [facility], [address], [place], [place] [address]    Special Requests   Final    BOTTLES DRAWN AEROBIC AND ANAEROBIC Blood Culture results may not be optimal due to an inadequate volume of blood received in culture bottles Performed at [facility], [address], [place], [place] [address]    Culture   Final    NO GROWTH 2 DAYS Performed at [facility], [address], [place], [place] [address]    Report Status PENDING  Incomplete  MRSA Next Gen by PCR, Nasal     Status: Abnormal   Collection Time: 11/16/23  8:30 AM   Specimen: Nasal Mucosa; Nasal Swab  Result Value Ref Range Status   MRSA by PCR Next Gen DETECTED (A) NOT DETECTED Final    Comment: (NOTE) The GeneXpert MRSA Assay (FDA approved for NASAL specimens only), is one component of a comprehensive MRSA colonization surveillance program. It is not intended to diagnose MRSA infection nor to guide or monitor treatment for MRSA infections. Test performance is not FDA approved in patients less than 53 years old. Performed at [facility], [address], [place], [place] [address]     Please note: You were cared for by a hospitalist during your hospital stay. Once you are discharged, your primary care physician will handle any further medical issues. Please note that NO REFILLS for any discharge medications will be authorized once you are discharged, as it is imperative that you return to your primary care physician (or establish a relationship with a primary care physician if you do not have  one) for your post hospital discharge needs so that they can reassess your need for medications  and monitor your lab values.    Time coordinating discharge: 40 minutes  SIGNED:   Ivonne Mustache, MD  [facility] 11/17/2023, 11:08 AM Pager [phone]  If 7PM-7AM, please contact night-coverage [email or web address] Password [email or web address]

## 2023-11-17 NOTE — Progress Notes (Signed)
 AVS reviewed w/ pt who verbalized an understanding. Pt dressed for d/c- PIV removed as noted. Ride enroute- will be here in 10 min

## 2023-11-17 NOTE — Progress Notes (Signed)
 Discharge meds in a secure bag delivered to pt in room by this RN

## 2023-11-18 ENCOUNTER — Telehealth: Payer: Self-pay

## 2023-11-18 DIAGNOSIS — Z940 Kidney transplant status: Principal | ICD-10-CM

## 2023-11-18 LAB — CULTURE, BLOOD (ROUTINE X 2)
Culture: NO GROWTH
Culture: NO GROWTH

## 2023-11-18 NOTE — Transitions of Care (Post Inpatient/ED Visit) (Signed)
   11/18/2023  Name: Tim Becker MRN: [id number] DOB: 1966-01-29  Today's TOC FU Call Status: Today's TOC FU Call Status:: Successful TOC FU Call Completed TOC FU Call Complete Date: 11/18/23 (Patient states he is not interested in participating in [facility] program) Patient's Name and Date of Birth confirmed.  Transition Care Management Follow-up Telephone Call Date of Discharge: 11/17/23 Discharge Facility: [facility]) Type of Discharge: Inpatient Admission Primary Inpatient Discharge Diagnosis:: CAP (community acquired pneumonia)  Shona Prow RN, CCM [place]  VBCI-Population Health RN Care Manager [phone]

## 2023-11-20 LAB — CULTURE, BLOOD (ROUTINE X 2)
Culture: NO GROWTH
Culture: NO GROWTH

## 2023-11-25 DIAGNOSIS — I10 Essential (primary) hypertension: Secondary | ICD-10-CM | POA: Diagnosis not present

## 2023-11-25 DIAGNOSIS — N184 Chronic kidney disease, stage 4 (severe): Secondary | ICD-10-CM | POA: Diagnosis not present

## 2023-11-25 DIAGNOSIS — I2510 Atherosclerotic heart disease of native coronary artery without angina pectoris: Secondary | ICD-10-CM | POA: Diagnosis not present

## 2023-11-25 DIAGNOSIS — E1169 Type 2 diabetes mellitus with other specified complication: Secondary | ICD-10-CM | POA: Diagnosis not present

## 2023-11-27 MED ORDER — CHLORTHALIDONE 25 MG TABLET
ORAL_TABLET | Freq: Every morning | ORAL | 11 refills | 30.00000 days | Status: CP
Start: 2023-11-27 — End: 2024-11-26
  Filled 2023-12-02: qty 180, 90d supply, fill #0

## 2023-12-01 DIAGNOSIS — Z940 Kidney transplant status: Principal | ICD-10-CM

## 2023-12-01 DIAGNOSIS — G4733 Obstructive sleep apnea (adult) (pediatric): Secondary | ICD-10-CM | POA: Diagnosis not present

## 2023-12-02 MED FILL — AMLODIPINE 10 MG TABLET: ORAL | 90 days supply | Qty: 90 | Fill #3

## 2023-12-02 MED FILL — JARDIANCE 25 MG TABLET: ORAL | 90 days supply | Qty: 90 | Fill #3

## 2023-12-02 MED FILL — CARVEDILOL 12.5 MG TABLET: ORAL | 90 days supply | Qty: 540 | Fill #3

## 2023-12-08 ENCOUNTER — Other Ambulatory Visit (HOSPITAL_COMMUNITY)
Admission: RE | Admit: 2023-12-08 | Discharge: 2023-12-08 | Disposition: A | Discharge status: Home / Self Care | Source: Ambulatory Visit | Attending: Pediatric Nephrology | Admitting: Pediatric Nephrology

## 2023-12-08 DIAGNOSIS — D899 Disorder involving the immune mechanism, unspecified: Secondary | ICD-10-CM | POA: Insufficient documentation

## 2023-12-08 DIAGNOSIS — Z09 Encounter for follow-up examination after completed treatment for conditions other than malignant neoplasm: Secondary | ICD-10-CM | POA: Insufficient documentation

## 2023-12-08 DIAGNOSIS — Z9483 Pancreas transplant status: Secondary | ICD-10-CM | POA: Diagnosis not present

## 2023-12-08 DIAGNOSIS — T8610 Unspecified complication of kidney transplant: Secondary | ICD-10-CM | POA: Insufficient documentation

## 2023-12-08 DIAGNOSIS — E1129 Type 2 diabetes mellitus with other diabetic kidney complication: Secondary | ICD-10-CM | POA: Insufficient documentation

## 2023-12-08 DIAGNOSIS — D631 Anemia in chronic kidney disease: Secondary | ICD-10-CM | POA: Diagnosis not present

## 2023-12-08 DIAGNOSIS — E559 Vitamin D deficiency, unspecified: Secondary | ICD-10-CM | POA: Diagnosis not present

## 2023-12-08 DIAGNOSIS — N390 Urinary tract infection, site not specified: Secondary | ICD-10-CM | POA: Insufficient documentation

## 2023-12-08 DIAGNOSIS — Z79899 Other long term (current) drug therapy: Secondary | ICD-10-CM | POA: Diagnosis not present

## 2023-12-08 DIAGNOSIS — Z114 Encounter for screening for human immunodeficiency virus [HIV]: Secondary | ICD-10-CM | POA: Insufficient documentation

## 2023-12-08 DIAGNOSIS — Z789 Other specified health status: Secondary | ICD-10-CM | POA: Insufficient documentation

## 2023-12-08 DIAGNOSIS — B259 Cytomegaloviral disease, unspecified: Secondary | ICD-10-CM | POA: Diagnosis not present

## 2023-12-08 DIAGNOSIS — Z940 Kidney transplant status: Secondary | ICD-10-CM | POA: Diagnosis not present

## 2023-12-08 LAB — BASIC METABOLIC PANEL WITH GFR
Anion gap: 12 (ref 5–15)
BUN: 36 mg/dL — ABNORMAL HIGH (ref 6–20)
CO2: 23 mmol/L (ref 22–32)
Calcium: 9.8 mg/dL (ref 8.9–10.3)
Chloride: 105 mmol/L (ref 98–111)
Creatinine, Ser: 2.31 mg/dL — ABNORMAL HIGH (ref 0.61–1.24)
GFR, Estimated: 32 mL/min — ABNORMAL LOW (ref >60)
Glucose, Bld: 165 mg/dL — ABNORMAL HIGH (ref 70–99)
Potassium: 4.6 mmol/L (ref 3.5–5.1)
Sodium: 140 mmol/L (ref 135–145)

## 2023-12-08 LAB — CBC WITH DIFFERENTIAL/PLATELET
Abs Immature Granulocytes: 0.03 K/uL (ref 0.00–0.07)
Basophils Absolute: 0 K/uL (ref 0.0–0.1)
Basophils Relative: 0 %
Eosinophils Absolute: 0.2 K/uL (ref 0.0–0.5)
Eosinophils Relative: 2 %
HCT: 35 % — ABNORMAL LOW (ref 39.0–52.0)
Hemoglobin: 9.7 g/dL — ABNORMAL LOW (ref 13.0–17.0)
Immature Granulocytes: 0 %
Lymphocytes Relative: 7 %
Lymphs Abs: 0.5 K/uL — ABNORMAL LOW (ref 0.7–4.0)
MCH: 24.7 pg — ABNORMAL LOW (ref 26.0–34.0)
MCHC: 27.7 g/dL — ABNORMAL LOW (ref 30.0–36.0)
MCV: 89.3 fL (ref 80.0–100.0)
Monocytes Absolute: 0.6 K/uL (ref 0.1–1.0)
Monocytes Relative: 9 %
Neutro Abs: 5.8 K/uL (ref 1.7–7.7)
Neutrophils Relative %: 82 %
Platelets: 189 K/uL (ref 150–400)
RBC: 3.92 MIL/uL — ABNORMAL LOW (ref 4.22–5.81)
RDW: 17.5 % — ABNORMAL HIGH (ref 11.5–15.5)
WBC: 7.1 K/uL (ref 4.0–10.5)
nRBC: 0 % (ref 0.0–0.2)

## 2023-12-08 LAB — URINALYSIS, COMPLETE (UACMP) WITH MICROSCOPIC
Bacteria, UA: NONE SEEN
Bilirubin Urine: NEGATIVE
Glucose, UA: >=500 mg/dL — AB
Hgb urine dipstick: NEGATIVE
Ketones, ur: NEGATIVE mg/dL
Leukocytes,Ua: NEGATIVE
Nitrite: NEGATIVE
Protein, ur: NEGATIVE mg/dL
Specific Gravity, Urine: 1.016 (ref 1.005–1.030)
pH: 6 (ref 5.0–8.0)

## 2023-12-08 LAB — MAGNESIUM: Magnesium: 1.7 mg/dL (ref 1.7–2.4)

## 2023-12-08 LAB — PHOSPHORUS: Phosphorus: 4.4 mg/dL (ref 2.5–4.6)

## 2023-12-09 DIAGNOSIS — Z940 Kidney transplant status: Principal | ICD-10-CM

## 2023-12-09 LAB — URINE CULTURE: Culture: NO GROWTH

## 2023-12-10 DIAGNOSIS — Z940 Kidney transplant status: Principal | ICD-10-CM

## 2023-12-10 LAB — TACROLIMUS LEVEL: Tacrolimus (FK506) - LabCorp: 7 ng/mL (ref 5.0–20.0)

## 2023-12-10 MED ORDER — LEVOTHYROXINE 75 MCG TABLET
ORAL_TABLET | Freq: Every day | ORAL | 3 refills | 90.00000 days
Start: 2023-12-10 — End: 2024-12-09

## 2023-12-11 MED FILL — GABAPENTIN 100 MG CAPSULE: ORAL | 30 days supply | Qty: 60 | Fill #8

## 2023-12-11 MED FILL — MYCOPHENOLATE SODIUM 180 MG TABLET,DELAYED RELEASE: ORAL | 30 days supply | Qty: 180 | Fill #11

## 2023-12-19 ENCOUNTER — Telehealth: Payer: Self-pay

## 2023-12-19 NOTE — Telephone Encounter (Signed)
 Patient called to request a prescription for diabetic shoes with inserts.

## 2023-12-22 DIAGNOSIS — Z940 Kidney transplant status: Principal | ICD-10-CM

## 2023-12-29 DIAGNOSIS — Z940 Kidney transplant status: Principal | ICD-10-CM

## 2023-12-29 MED ORDER — METHOCARBAMOL 500 MG TABLET
ORAL_TABLET | Freq: Three times a day (TID) | ORAL | 1 refills | 10.00000 days | Status: CP | PRN
Start: 2023-12-29 — End: ?

## 2024-01-02 DIAGNOSIS — Z940 Kidney transplant status: Principal | ICD-10-CM

## 2024-01-02 MED ORDER — TACROLIMUS XR 1 MG TABLET,EXTENDED RELEASE 24 HR
ORAL_TABLET | Freq: Every day | ORAL | 3 refills | 90.00000 days | Status: CP
Start: 2024-01-02 — End: 2025-01-01

## 2024-01-05 ENCOUNTER — Other Ambulatory Visit (HOSPITAL_COMMUNITY)
Admission: RE | Admit: 2024-01-05 | Discharge: 2024-01-05 | Disposition: A | Discharge status: Home / Self Care | Source: Ambulatory Visit | Attending: Pediatric Nephrology | Admitting: Pediatric Nephrology

## 2024-01-05 DIAGNOSIS — Z940 Kidney transplant status: Principal | ICD-10-CM

## 2024-01-05 DIAGNOSIS — D8940 Mast cell activation, unspecified: Secondary | ICD-10-CM | POA: Insufficient documentation

## 2024-01-05 LAB — URINALYSIS, W/ REFLEX TO CULTURE (INFECTION SUSPECTED)
Bacteria, UA: NONE SEEN
Bilirubin Urine: NEGATIVE
Glucose, UA: >=500 mg/dL — AB
Hgb urine dipstick: NEGATIVE
Ketones, ur: NEGATIVE mg/dL
Leukocytes,Ua: NEGATIVE
Nitrite: NEGATIVE
Protein, ur: NEGATIVE mg/dL
Specific Gravity, Urine: 1.016 (ref 1.005–1.030)
pH: 5 (ref 5.0–8.0)

## 2024-01-05 LAB — CBC WITH DIFFERENTIAL/PLATELET
Abs Immature Granulocytes: 0.04 K/uL (ref 0.00–0.07)
Basophils Absolute: 0 K/uL (ref 0.0–0.1)
Basophils Relative: 1 %
Eosinophils Absolute: 0.1 K/uL (ref 0.0–0.5)
Eosinophils Relative: 2 %
HCT: 36.7 % — ABNORMAL LOW (ref 39.0–52.0)
Hemoglobin: 10.1 g/dL — ABNORMAL LOW (ref 13.0–17.0)
Immature Granulocytes: 1 %
Lymphocytes Relative: 6 %
Lymphs Abs: 0.4 K/uL — ABNORMAL LOW (ref 0.7–4.0)
MCH: 24.6 pg — ABNORMAL LOW (ref 26.0–34.0)
MCHC: 27.5 g/dL — ABNORMAL LOW (ref 30.0–36.0)
MCV: 89.5 fL (ref 80.0–100.0)
Monocytes Absolute: 0.5 K/uL (ref 0.1–1.0)
Monocytes Relative: 7 %
Neutro Abs: 6.1 K/uL (ref 1.7–7.7)
Neutrophils Relative %: 83 %
Platelets: 216 K/uL (ref 150–400)
RBC: 4.1 MIL/uL — ABNORMAL LOW (ref 4.22–5.81)
RDW: 15.8 % — ABNORMAL HIGH (ref 11.5–15.5)
WBC: 7.2 K/uL (ref 4.0–10.5)
nRBC: 0 % (ref 0.0–0.2)

## 2024-01-05 LAB — BASIC METABOLIC PANEL WITH GFR
Anion gap: 12 (ref 5–15)
BUN: 46 mg/dL — ABNORMAL HIGH (ref 6–20)
CO2: 24 mmol/L (ref 22–32)
Calcium: 10.1 mg/dL (ref 8.9–10.3)
Chloride: 107 mmol/L (ref 98–111)
Creatinine, Ser: 2.42 mg/dL — ABNORMAL HIGH (ref 0.61–1.24)
GFR, Estimated: 30 mL/min — ABNORMAL LOW (ref >60)
Glucose, Bld: 152 mg/dL — ABNORMAL HIGH (ref 70–99)
Potassium: 4.5 mmol/L (ref 3.5–5.1)
Sodium: 142 mmol/L (ref 135–145)

## 2024-01-05 LAB — MAGNESIUM: Magnesium: 2.4 mg/dL (ref 1.7–2.4)

## 2024-01-05 LAB — PHOSPHORUS: Phosphorus: 4.5 mg/dL (ref 2.5–4.6)

## 2024-01-05 MED ORDER — LEVOTHYROXINE 75 MCG TABLET
ORAL_TABLET | Freq: Every day | ORAL | 3 refills | 90.00000 days | Status: CP
Start: 2024-01-05 — End: 2025-01-04
  Filled 2024-01-07: qty 90, 90d supply, fill #0

## 2024-01-06 MED FILL — GABAPENTIN 100 MG CAPSULE: ORAL | 30 days supply | Qty: 60 | Fill #9

## 2024-01-07 DIAGNOSIS — Z940 Kidney transplant status: Principal | ICD-10-CM

## 2024-01-07 LAB — TACROLIMUS LEVEL: Tacrolimus (FK506) - LabCorp: 7 ng/mL (ref 5.0–20.0)

## 2024-01-07 MED ORDER — MYCOPHENOLATE SODIUM 180 MG TABLET,DELAYED RELEASE
ORAL_TABLET | Freq: Two times a day (BID) | ORAL | 11 refills | 30.00000 days | Status: CP
Start: 2024-01-07 — End: 2025-01-06
  Filled 2024-01-20: qty 180, 30d supply, fill #0

## 2024-01-12 ENCOUNTER — Ambulatory Visit (INDEPENDENT_AMBULATORY_CARE_PROVIDER_SITE_OTHER): Admitting: Orthopedic Surgery

## 2024-01-12 ENCOUNTER — Ambulatory Visit (INDEPENDENT_AMBULATORY_CARE_PROVIDER_SITE_OTHER): Admitting: Podiatry

## 2024-01-12 VITALS — Ht 68.0 in | Wt 233.0 lb

## 2024-01-12 DIAGNOSIS — E08621 Diabetes mellitus due to underlying condition with foot ulcer: Secondary | ICD-10-CM | POA: Diagnosis not present

## 2024-01-12 DIAGNOSIS — Z89511 Acquired absence of right leg below knee: Secondary | ICD-10-CM | POA: Diagnosis not present

## 2024-01-12 DIAGNOSIS — L97422 Non-pressure chronic ulcer of left heel and midfoot with fat layer exposed: Secondary | ICD-10-CM

## 2024-01-12 MED ORDER — SILVER SULFADIAZINE 1 % EX CREA: TOPICAL_CREAM | CUTANEOUS | 1 refills | Status: AC

## 2024-01-12 NOTE — Progress Notes (Signed)
 Chief Complaint  Patient presents with   Foot Problem    RM 6 Patient is here for dry skin removal of left foot and referral diabetic shoes.     Subjective:  58 y.o. male with PMHx of diabetes mellitus, ESRD, peripheral neuropathy past surgical history of BKA RLE, hallux amputation LLE presenting today for onset of ulcers to the plantar aspect of the left foot   Past Medical History:  Diagnosis Date   Anemia    Arthritis    Cancer (HCC)    Renal Tumor   CKD (chronic kidney disease)    Coronary artery disease    Diabetes (HCC)    DM (diabetes mellitus) with complications (HCC)    ESRD (end stage renal disease) (HCC)    Pre- dialysis   History of colonoscopy 2011   Hypertension    Kidney transplanted 2022   Left kidney mass    Neuropathy    Obesity    Osteomyelitis, chronic, ankle or foot (HCC)    PONV (postoperative nausea and vomiting)    Renal insufficiency    Patient is on Dialysis and receives M,W and F.   S/P BKA (below knee amputation) (HCC) 08/2014   Right , Daniel Mcalpine, [place]   Sleep apnea    CPAP   Thyroid  disease    Past Surgical History:  Procedure Laterality Date   BASCILIC VEIN TRANSPOSITION Left 02/17/2015   Procedure: BASCILIC VEIN TRANSPOSITION;  Surgeon: Cordella KANDICE Shawl, MD;  Location: [facility];  Service: Vascular;  Laterality: Left;   BELOW KNEE LEG AMPUTATION Right    CORONARY ANGIOPLASTY WITH STENT PLACEMENT     EYE SURGERY     FOOT SURGERY     Multiple R foot surgery for infection and charcot foot   I & D EXTREMITY Left 04/16/2016   Procedure: IRRIGATION AND DEBRIDEMENT EXTREMITY/GREAT TOE AMP.;  Surgeon: Thresa CHRISTELLA Sar, DPM;  Location: [facility];  Service: Podiatry;  Laterality: Left;   IR ANGIOGRAM FOLLOW UP STUDY  2025   JOINT REPLACEMENT     LAPAROSCOPIC NEPHRECTOMY, HAND ASSISTED Left 07/11/2015   Procedure: HAND ASSISTED LAPAROSCOPIC NEPHRECTOMY;  Surgeon: Rosina Riis, MD;  Location: [facility];  Service: Urology;  Laterality: Left;    PERIPHERAL VASCULAR CATHETERIZATION Left 12/06/2014   Procedure: A/V Shuntogram/Fistulagram;  Surgeon: Cordella KANDICE Shawl, MD;  Location: [facility];  Service: Cardiovascular;  Laterality: Left;   PERIPHERAL VASCULAR CATHETERIZATION Left 12/06/2014   Procedure: A/V Shunt Intervention;  Surgeon: Cordella KANDICE Shawl, MD;  Location: [facility];  Service: Cardiovascular;  Laterality: Left;   TONSILLECTOMY     No Known Allergies   LT foot 01/12/2024  Objective/Physical Exam General: The patient is alert and oriented x3 in no acute distress.  Dermatology:  No open wounds noted.  Hyperkeratotic dystrophic nails noted 2-5 left.  Preulcerative calluses and ulcer noted to the plantar aspect of the left foot.  Please see above noted photo.  They appear very superficial and stable.  Good potential for healing  Vascular: Palpable pedal pulses left lower extremity.  No edema or erythema  Neurological: Light touch and protective threshold diminished left  Musculoskeletal Exam: PSxHx BKA RLE, hallux amp LLE  Assessment: 1. diabetes mellitus w/ peripheral neuropathy 2.  Preulcerative calluses and ulcer plantar aspect of the left foot 3.  History of left great toe amputation 4.  BKA RLE   Plan of Care:  -Patient evaluated.   -Excisional debridement of the callus tissue and  ulcer to the plantar aspect of the left foot was performed today using a tissue nipper.  The wound.  Very superficial and stable -Prescription for Silvadene cream.  Apply daily under occlusion with a Band-Aid -Appointment with our orthotics department for diabetic shoes and custom molded Plastizote insoles with a toe filler -Return to clinic with me PRN  Thresa EMERSON Sar, DPM [facility]  Dr. Thresa EMERSON Sar, DPM    [address] [place], [place] [address]                Office [phone]  Fax [phone]

## 2024-01-14 ENCOUNTER — Encounter: Payer: Self-pay | Admitting: Orthopedic Surgery

## 2024-01-14 NOTE — Progress Notes (Signed)
 Office Visit Note   Patient: Tim Becker           Date of Birth: January 04, 1966           MRN: [id number] Visit Date: 01/12/2024              Requested by: Leonarda Roxan BROCKS, NP [address] [place],  [place] [address] PCP: Leonarda Roxan BROCKS, NP  Chief Complaint  Patient presents with   Right Leg - Follow-up    HX right BKA       HPI: Discussed the use of AI scribe software for clinical note transcription with the patient, who gave verbal consent to proceed.  History of Present Illness Tim Becker is a 58 year old male who presents with skin breakdown and ulceration over the residual limb.  He has been experiencing pain and skin breakdown over the residual limb, particularly in the area of the distal tibia and fibula, for approximately a week. He has been applying antibiotic ointment and using medicated patches to manage the symptoms.  He has been working in a kitchen for about a month, which involves constant movement, potentially exacerbating the issue. The liner may be worn out, causing the prosthetic to slide and create pressure points, leading to ulceration and skin breakdown.  He has been moisturizing the area at night to manage dry skin but avoids doing so during the day to prevent further slippage of the prosthetic. The skin breakdown has worsened over the last few days, particularly after using a brush to remove dry skin, which seemed to temporarily improve the condition before it deteriorated again.  No issues with the foot and ankle were reported by the patient.     Assessment & Plan: Visit Diagnoses:  1. History of right below knee amputation (HCC)     Plan: Assessment and Plan Assessment & Plan Ulcers and skin breakdown of residual limb (amputated leg) Superficial ulcers over residual tibia with skin breakdown on anterior distal tibia and inferior patella. Potential breakdown over fibular head. No cellulitis or swelling. Likely due to improper socket  and liner fit causing pressure points. - Continue ointment on affected areas. - Apply lotion at night for skin moisture. - Use pads on pressure points to prevent further breakdown. - Instructed to return if ulcers or skin breakdown worsen.  Decreased volume of residual limb requiring new socket and liner Decreased residual limb volume causing ill-fitting socket and liner, contributing to skin breakdown. Liner torn, requires replacement. - Provide prescription for [employer] for new socket and liner. - Instruct to contact [employer] for fitting of new socket and liner.      Follow-Up Instructions: No follow-ups on file.   Ortho Exam  Patient is alert, oriented, no adenopathy, well-dressed, normal affect, normal respiratory effort. Physical Exam SKIN: Two end bearing ulcers over residual tibia, 1 cm diameter, superficial. Skin breakdown over anterior distal tibia and inferior patella. Potential skin breakdown over fibular head. No cellulitis, no swelling.  ;Patient is an existing right transtibial  amputee.  Patient's current comorbidities are not expected to impact the ability to function with the prescribed prosthesis. Patient verbally communicates a strong desire to use a prosthesis. Patient currently requires mobility aids to ambulate without a prosthesis.  Expects not to use mobility aids with a new prosthesis. Patient is expected to resume or reach their K Level within 6 months. Patient was active before the amputation and independent with stairs, uneven terrain, varying cadence, and a community  ambulator.  Patient is a K3 level ambulator that spends a lot of time walking around on uneven terrain over obstacles, up and down stairs, and ambulates with a variable cadence.         Imaging: No results found. No images are attached to the encounter.  Labs: Lab Results  Component Value Date   HGBA1C 6.5 (H) 11/14/2023   HGBA1C 6.5 (H) 10/27/2023   HGBA1C 7.2 (H) 04/28/2023    ESRSEDRATE 74 (H) 04/15/2016   ESRSEDRATE 127 (H) 08/10/2014   CRP 1.6 (H) 11/16/2023   CRP 13.3 (H) 04/15/2016   CRP 8.9 (H) 08/10/2014   LABURIC 8.3 07/14/2023   LABURIC 7.8 06/09/2023   REPTSTATUS 12/09/2023 FINAL 12/08/2023   GRAMSTAIN  03/24/2022    FEW WBC PRESENT,BOTH PMN AND MONONUCLEAR NO ORGANISMS SEEN    CULT  12/08/2023    NO GROWTH Performed at [facility], [address], [place], [place] [address]    LABORGA METHICILLIN RESISTANT STAPHYLOCOCCUS AUREUS 04/16/2016     Lab Results  Component Value Date   ALBUMIN 3.6 11/13/2023   ALBUMIN 3.7 11/04/2023   ALBUMIN 2.8 (L) 03/23/2022    Lab Results  Component Value Date   MG 2.4 01/05/2024   MG 1.7 12/08/2023   MG 1.7 10/16/2023   No results found for: VD25OH  No results found for: PREALBUMIN    Latest Ref Rng & Units 01/05/2024    9:56 AM 12/08/2023    9:48 AM 11/16/2023    5:40 AM  CBC EXTENDED  WBC 4.0 - 10.5 K/uL 7.2  7.1  1.6   RBC 4.22 - 5.81 MIL/uL 4.10  3.92  4.20   Hemoglobin 13.0 - 17.0 g/dL 89.8  9.7  89.9   HCT 60.9 - 52.0 % 36.7  35.0  35.3   Platelets 150 - 400 K/uL 216  189  185   NEUT# 1.7 - 7.7 K/uL 6.1  5.8  1.2   Lymph# 0.7 - 4.0 K/uL 0.4  0.5  0.2      There is no height or weight on file to calculate BMI.  Orders:  No orders of the defined types were placed in this encounter.  No orders of the defined types were placed in this encounter.    Procedures: No procedures performed  Clinical Data: No additional findings.  ROS:  All other systems negative, except as noted in the HPI. Review of Systems  Objective: Vital Signs: There were no vitals taken for this visit.  Specialty Comments:  No specialty comments available.  PMFS History: Patient Active Problem List   Diagnosis Date Noted   History of renal transplant 11/15/2023   Diarrhea 11/15/2023   Hyperkalemia 11/15/2023   Metabolic acidosis 11/15/2023   Anemia of chronic disease 11/15/2023   COVID-19  virus infection 11/13/2023   Renal cell carcinoma, left (HCC) 10/27/2023   Acute kidney injury superimposed on CKD 03/23/2022   CAP (community acquired pneumonia) 03/22/2022   Hyperlipidemia LDL goal <100 10/24/2021   Severe nonproliferative diabetic retinopathy of right eye (HCC) 09/12/2020   Severe nonproliferative diabetic retinopathy of left eye (HCC) 09/12/2020   Nuclear sclerotic cataract of both eyes 09/12/2020   Charcot's joint, left ankle and foot 10/29/2016   History of right below knee amputation (HCC) 10/29/2016   Diabetic polyneuropathy associated with type 2 diabetes mellitus (HCC) 10/29/2016   ESRD (end stage renal disease) on dialysis (HCC) 04/15/2016   Left renal mass 07/11/2015   Biliary calculi 09/14/2014  Hemodialysis-associated hypotension 09/13/2014   H/O amputation of leg through tibia and fibula (HCC) 09/08/2014   Anemia associated with chronic renal failure 09/06/2014   Type 2 diabetes mellitus with diabetic neuropathy (HCC) 08/07/2014   Essential hypertension 08/07/2014   Obesity 08/07/2014   Hypothyroidism 08/07/2014   Kidney lump 04/06/2014   Obstructive sleep apnea 04/06/2014   Diabetes mellitus (HCC) 03/23/2014   Patient awaiting renal transplant 03/23/2014   Asthma, moderate persistent 10/28/2013   Anemia due to blood loss 09/14/2013   Morbid obesity (HCC) 09/11/2013   Morbid (severe) obesity due to excess calories (HCC) 09/11/2013   Type 2 diabetes mellitus (HCC) 09/10/2013   Mild persistent asthma with acute exacerbation 09/10/2013   Abnormal ECG 05/07/2013   CAD in native artery 05/07/2013   Past Medical History:  Diagnosis Date   Anemia    Arthritis    Cancer (HCC)    Renal Tumor   CKD (chronic kidney disease)    Coronary artery disease    Diabetes (HCC)    DM (diabetes mellitus) with complications (HCC)    ESRD (end stage renal disease) (HCC)    Pre- dialysis   History of colonoscopy 2011   Hypertension    Kidney transplanted 2022    Left kidney mass    Neuropathy    Obesity    Osteomyelitis, chronic, ankle or foot (HCC)    PONV (postoperative nausea and vomiting)    Renal insufficiency    Patient is on Dialysis and receives M,W and F.   S/P BKA (below knee amputation) (HCC) 08/2014   Right , Daniel Mcalpine, [place]   Sleep apnea    CPAP   Thyroid  disease     Family History  Problem Relation Age of Onset   Diabetes Mother    Kidney disease Mother    Breast cancer Mother     Past Surgical History:  Procedure Laterality Date   BASCILIC VEIN TRANSPOSITION Left 02/17/2015   Procedure: BASCILIC VEIN TRANSPOSITION;  Surgeon: Cordella KANDICE Shawl, MD;  Location: [facility];  Service: Vascular;  Laterality: Left;   BELOW KNEE LEG AMPUTATION Right    CORONARY ANGIOPLASTY WITH STENT PLACEMENT     EYE SURGERY     FOOT SURGERY     Multiple R foot surgery for infection and charcot foot   I & D EXTREMITY Left 04/16/2016   Procedure: IRRIGATION AND DEBRIDEMENT EXTREMITY/GREAT TOE AMP.;  Surgeon: Thresa CHRISTELLA Sar, DPM;  Location: [facility];  Service: Podiatry;  Laterality: Left;   IR ANGIOGRAM FOLLOW UP STUDY  2025   JOINT REPLACEMENT     LAPAROSCOPIC NEPHRECTOMY, HAND ASSISTED Left 07/11/2015   Procedure: HAND ASSISTED LAPAROSCOPIC NEPHRECTOMY;  Surgeon: Rosina Riis, MD;  Location: [facility];  Service: Urology;  Laterality: Left;   PERIPHERAL VASCULAR CATHETERIZATION Left 12/06/2014   Procedure: A/V Shuntogram/Fistulagram;  Surgeon: Cordella KANDICE Shawl, MD;  Location: [facility];  Service: Cardiovascular;  Laterality: Left;   PERIPHERAL VASCULAR CATHETERIZATION Left 12/06/2014   Procedure: A/V Shunt Intervention;  Surgeon: Cordella KANDICE Shawl, MD;  Location: [facility];  Service: Cardiovascular;  Laterality: Left;   TONSILLECTOMY     Social History   Occupational History   Not on file  Tobacco Use   Smoking status: Never   Smokeless tobacco: Never  Vaping Use   Vaping status: Never Used  Substance and Sexual  Activity   Alcohol use: No   Drug use: Never   Sexual activity: Yes

## 2024-01-15 DIAGNOSIS — G4733 Obstructive sleep apnea (adult) (pediatric): Secondary | ICD-10-CM | POA: Diagnosis not present

## 2024-01-20 MED FILL — ATORVASTATIN 40 MG TABLET: ORAL | 90 days supply | Qty: 90 | Fill #3

## 2024-01-20 MED FILL — ALLOPURINOL 100 MG TABLET: ORAL | 90 days supply | Qty: 45 | Fill #1

## 2024-01-22 ENCOUNTER — Ambulatory Visit: Admitting: Dermatology

## 2024-01-22 ENCOUNTER — Encounter: Payer: Self-pay | Admitting: Dermatology

## 2024-01-22 VITALS — BP 161/72 | HR 70

## 2024-01-22 DIAGNOSIS — L578 Other skin changes due to chronic exposure to nonionizing radiation: Secondary | ICD-10-CM

## 2024-01-22 DIAGNOSIS — D1801 Hemangioma of skin and subcutaneous tissue: Secondary | ICD-10-CM

## 2024-01-22 DIAGNOSIS — L814 Other melanin hyperpigmentation: Secondary | ICD-10-CM | POA: Diagnosis not present

## 2024-01-22 DIAGNOSIS — D849 Immunodeficiency, unspecified: Secondary | ICD-10-CM

## 2024-01-22 DIAGNOSIS — D229 Melanocytic nevi, unspecified: Secondary | ICD-10-CM | POA: Diagnosis not present

## 2024-01-22 DIAGNOSIS — L821 Other seborrheic keratosis: Secondary | ICD-10-CM | POA: Diagnosis not present

## 2024-01-22 DIAGNOSIS — Z940 Kidney transplant status: Secondary | ICD-10-CM

## 2024-01-22 DIAGNOSIS — L280 Lichen simplex chronicus: Secondary | ICD-10-CM | POA: Diagnosis not present

## 2024-01-22 MED ORDER — TRIAMCINOLONE ACETONIDE 0.1 % EX OINT: 1.0000 | TOPICAL_OINTMENT | Freq: Two times a day (BID) | CUTANEOUS | 2 refills | Status: AC | PRN

## 2024-01-22 NOTE — Progress Notes (Unsigned)
 New Patient Visit   Subjective  Tim Becker is a 58 y.o. male who presents for the following: Skin Cancer Screening and Full Body Skin Exam  The patient presents for Total-Body Skin Exam (TBSE) for skin cancer screening and mole check. The patient has spots, moles and lesions to be evaluated, some may be new or changing. Patient reports that he has a concern about dry skin om his stomach due to giving his self insulin . Patient denies any family history of skin cancer.  Patient has a history of a kidney transplant and has been referred for skin cancer monitoring.   The following portions of the chart were reviewed this encounter and updated as appropriate: medications, allergies, medical history  Review of Systems:  No other skin or systemic complaints except as noted in HPI or Assessment and Plan.  Objective  Well appearing patient in no apparent distress; mood and affect are within normal limits.  A full examination was performed including scalp, head, eyes, ears, nose, lips, neck, chest, axillae, abdomen, back, buttocks, bilateral upper extremities, bilateral lower extremities, hands, feet, fingers, toes, fingernails, and toenails. All findings within normal limits unless otherwise noted below.   Relevant physical exam findings are noted in the Assessment and Plan.    Assessment & Plan   SKIN CANCER SCREENING PERFORMED TODAY.  ACTINIC DAMAGE - Chronic condition, secondary to cumulative UV/sun exposure - diffuse scaly erythematous macules with underlying dyspigmentation - Recommend daily broad spectrum sunscreen SPF 30+ to sun-exposed areas, reapply every 2 hours as needed.  - Staying in the shade or wearing long sleeves, sun glasses (UVA+UVB protection) and wide brim hats (4-inch brim around the entire circumference of the hat) are also recommended for sun protection.  - Call for new or changing lesions.  LENTIGINES, SEBORRHEIC KERATOSES, HEMANGIOMAS - Benign normal skin  lesions - Benign-appearing - Call for any changes  MELANOCYTIC NEVI - Tan-brown and/or pink-flesh-colored symmetric macules and papules - Benign appearing on exam today - Observation - Call clinic for new or changing moles - Recommend daily use of broad spectrum spf 30+ sunscreen to sun-exposed areas.   Immunocompromised due to Kidney Transplant - Discussed increased risk of NMSC due to immunocompromised state - Recommend TBSEs every 6 months  LICHEN SIMPLEX CHRONICUS/PRURIGO NODULARIS Exam: Excoriated lichenified papules and/or plaques at abdomen, from insulin  injections  Not at goal  Lichen simplex chronicus [name]) is a persistent itchy area of thickened skin that is induced by chronic rubbing and/or scratching (chronic dermatitis).  These areas may be pink, hyperpigmented and may have excoriations and bumps (prurigo nodules- PN).  LSC/PN is commonly observed in uncontrolled atopic dermatitis and other forms of eczema, and in other itchy skin conditions (eg, insect bites, scabies).  Sometimes it is not possible to know initial cause of LSC/PN if it has been present for a long time.  It generally responds well to treatment with high potency topical steroids.  It is important to stop rubbing/scratching the area in order to break the itch-scratch-rash-itch cycle, in order for the rash to resolve.   Treatment Plan: Triamcinolone  0.1%  ointment  Avoid picking/rubbing/scratching  Discussed avoiding injecting himself in the same spot for insulin  injections LICHEN SIMPLEX CHRONICUS   Related Medications triamcinolone  ointment (KENALOG ) 0.1 % Apply 1 Application topically 2 (two) times daily as needed (Rash).  Return in about 6 months (around 07/22/2024) for TBSE follow up.  I, Berwyn Lesches, Surg Tech III, am acting as scribe for RUFUS CHRISTELLA HOLY, MD.  Documentation: I have reviewed the above documentation for accuracy and completeness, and I agree with the above.  RUFUS CHRISTELLA HOLY, MD

## 2024-01-22 NOTE — Patient Instructions (Signed)

## 2024-02-02 ENCOUNTER — Encounter: Payer: Self-pay | Admitting: Radiology

## 2024-02-03 ENCOUNTER — Other Ambulatory Visit (HOSPITAL_COMMUNITY)
Admission: RE | Admit: 2024-02-03 | Discharge: 2024-02-03 | Disposition: A | Discharge status: Home / Self Care | Source: Ambulatory Visit | Attending: Pediatric Nephrology | Admitting: Pediatric Nephrology

## 2024-02-03 DIAGNOSIS — Z940 Kidney transplant status: Principal | ICD-10-CM

## 2024-02-03 DIAGNOSIS — D8940 Mast cell activation, unspecified: Secondary | ICD-10-CM | POA: Insufficient documentation

## 2024-02-03 LAB — CBC WITH DIFFERENTIAL/PLATELET
Abs Immature Granulocytes: 0.11 K/uL — ABNORMAL HIGH (ref 0.00–0.07)
Basophils Absolute: 0 K/uL (ref 0.0–0.1)
Basophils Relative: 1 %
Eosinophils Absolute: 0.1 K/uL (ref 0.0–0.5)
Eosinophils Relative: 2 %
HCT: 38.3 % — ABNORMAL LOW (ref 39.0–52.0)
Hemoglobin: 10.5 g/dL — ABNORMAL LOW (ref 13.0–17.0)
Immature Granulocytes: 2 %
Lymphocytes Relative: 8 %
Lymphs Abs: 0.6 K/uL — ABNORMAL LOW (ref 0.7–4.0)
MCH: 24.1 pg — ABNORMAL LOW (ref 26.0–34.0)
MCHC: 27.4 g/dL — ABNORMAL LOW (ref 30.0–36.0)
MCV: 87.8 fL (ref 80.0–100.0)
Monocytes Absolute: 0.7 K/uL (ref 0.1–1.0)
Monocytes Relative: 10 %
Neutro Abs: 6 K/uL (ref 1.7–7.7)
Neutrophils Relative %: 77 %
Platelets: 220 K/uL (ref 150–400)
RBC: 4.36 MIL/uL (ref 4.22–5.81)
RDW: 14.5 % (ref 11.5–15.5)
WBC: 7.5 K/uL (ref 4.0–10.5)
nRBC: 0 % (ref 0.0–0.2)

## 2024-02-03 LAB — BASIC METABOLIC PANEL WITH GFR
Anion gap: 12 (ref 5–15)
BUN: 51 mg/dL — ABNORMAL HIGH (ref 6–20)
CO2: 25 mmol/L (ref 22–32)
Calcium: 9.7 mg/dL (ref 8.9–10.3)
Chloride: 103 mmol/L (ref 98–111)
Creatinine, Ser: 2.6 mg/dL — ABNORMAL HIGH (ref 0.61–1.24)
GFR, Estimated: 28 mL/min — ABNORMAL LOW (ref >60)
Glucose, Bld: 166 mg/dL — ABNORMAL HIGH (ref 70–99)
Potassium: 4.3 mmol/L (ref 3.5–5.1)
Sodium: 141 mmol/L (ref 135–145)

## 2024-02-03 LAB — MAGNESIUM: Magnesium: 2.2 mg/dL (ref 1.7–2.4)

## 2024-02-03 LAB — PHOSPHORUS: Phosphorus: 4.3 mg/dL (ref 2.5–4.6)

## 2024-02-05 LAB — TACROLIMUS LEVEL: Tacrolimus (FK506) - LabCorp: 7.7 ng/mL (ref 5.0–20.0)

## 2024-02-06 DIAGNOSIS — Z940 Kidney transplant status: Principal | ICD-10-CM

## 2024-02-08 DIAGNOSIS — Z940 Kidney transplant status: Principal | ICD-10-CM

## 2024-02-09 DIAGNOSIS — Z940 Kidney transplant status: Principal | ICD-10-CM

## 2024-02-10 DIAGNOSIS — Z940 Kidney transplant status: Principal | ICD-10-CM

## 2024-02-16 MED FILL — MYCOPHENOLATE SODIUM 180 MG TABLET,DELAYED RELEASE: ORAL | 30 days supply | Qty: 180 | Fill #1

## 2024-02-16 MED FILL — HYDRALAZINE 100 MG TABLET: ORAL | 90 days supply | Qty: 270 | Fill #1

## 2024-02-16 MED FILL — GABAPENTIN 100 MG CAPSULE: ORAL | 30 days supply | Qty: 60 | Fill #10

## 2024-02-20 DIAGNOSIS — Z940 Kidney transplant status: Principal | ICD-10-CM

## 2024-02-22 DIAGNOSIS — Z940 Kidney transplant status: Principal | ICD-10-CM

## 2024-02-22 MED ORDER — METHOCARBAMOL 500 MG TABLET
ORAL_TABLET | Freq: Three times a day (TID) | ORAL | 1 refills | 10.00000 days | PRN
Start: 2024-02-22 — End: ?

## 2024-02-23 MED FILL — CARVEDILOL 12.5 MG TABLET: ORAL | 60 days supply | Qty: 360 | Fill #4

## 2024-02-23 MED FILL — CHLORTHALIDONE 25 MG TABLET: ORAL | 90 days supply | Qty: 180 | Fill #1

## 2024-02-24 DIAGNOSIS — Z940 Kidney transplant status: Principal | ICD-10-CM

## 2024-02-24 MED ORDER — METHOCARBAMOL 500 MG TABLET
ORAL_TABLET | Freq: Three times a day (TID) | ORAL | 1 refills | 10.00000 days | Status: CP | PRN
Start: 2024-02-24 — End: ?

## 2024-02-24 MED ORDER — ENVARSUS XR 1 MG TABLET,EXTENDED RELEASE
ORAL_TABLET | ORAL | 0 refills | 0.00000 days | Status: CP
Start: 2024-02-24 — End: ?

## 2024-03-07 DIAGNOSIS — Z940 Kidney transplant status: Principal | ICD-10-CM

## 2024-03-08 DIAGNOSIS — Z940 Kidney transplant status: Principal | ICD-10-CM

## 2024-03-08 MED ORDER — EMPAGLIFLOZIN 25 MG TABLET
ORAL_TABLET | Freq: Every day | ORAL | 3 refills | 90.00000 days | Status: CP
Start: 2024-03-08 — End: 2025-03-08
  Filled 2024-03-11: qty 90, 90d supply, fill #0

## 2024-03-08 MED ORDER — AMLODIPINE 10 MG TABLET
ORAL_TABLET | Freq: Every day | ORAL | 3 refills | 90.00000 days | Status: CP
Start: 2024-03-08 — End: 2025-03-08
  Filled 2024-03-11: qty 90, 90d supply, fill #0

## 2024-03-11 ENCOUNTER — Other Ambulatory Visit (HOSPITAL_COMMUNITY)
Admission: RE | Admit: 2024-03-11 | Discharge: 2024-03-11 | Disposition: A | Discharge status: Home / Self Care | Source: Ambulatory Visit | Attending: Pediatric Nephrology | Admitting: Pediatric Nephrology

## 2024-03-11 DIAGNOSIS — D8940 Mast cell activation, unspecified: Secondary | ICD-10-CM | POA: Diagnosis not present

## 2024-03-11 DIAGNOSIS — Z940 Kidney transplant status: Secondary | ICD-10-CM | POA: Insufficient documentation

## 2024-03-11 LAB — PHOSPHORUS: Phosphorus: 3.4 mg/dL (ref 2.5–4.6)

## 2024-03-11 LAB — BASIC METABOLIC PANEL WITH GFR
Anion gap: 12 (ref 5–15)
BUN: 42 mg/dL — ABNORMAL HIGH (ref 6–20)
CO2: 26 mmol/L (ref 22–32)
Calcium: 9.5 mg/dL (ref 8.9–10.3)
Chloride: 103 mmol/L (ref 98–111)
Creatinine, Ser: 2.13 mg/dL — ABNORMAL HIGH (ref 0.61–1.24)
GFR, Estimated: 35 mL/min — ABNORMAL LOW (ref >60)
Glucose, Bld: 153 mg/dL — ABNORMAL HIGH (ref 70–99)
Potassium: 4.3 mmol/L (ref 3.5–5.1)
Sodium: 141 mmol/L (ref 135–145)

## 2024-03-11 LAB — CBC WITH DIFFERENTIAL/PLATELET
Abs Immature Granulocytes: 0.06 K/uL (ref 0.00–0.07)
Basophils Absolute: 0 K/uL (ref 0.0–0.1)
Basophils Relative: 0 %
Eosinophils Absolute: 0.1 K/uL (ref 0.0–0.5)
Eosinophils Relative: 1 %
HCT: 40.2 % (ref 39.0–52.0)
Hemoglobin: 11.8 g/dL — ABNORMAL LOW (ref 13.0–17.0)
Immature Granulocytes: 1 %
Lymphocytes Relative: 9 %
Lymphs Abs: 0.7 K/uL (ref 0.7–4.0)
MCH: 25.2 pg — ABNORMAL LOW (ref 26.0–34.0)
MCHC: 29.4 g/dL — ABNORMAL LOW (ref 30.0–36.0)
MCV: 85.9 fL (ref 80.0–100.0)
Monocytes Absolute: 0.6 K/uL (ref 0.1–1.0)
Monocytes Relative: 8 %
Neutro Abs: 5.9 K/uL (ref 1.7–7.7)
Neutrophils Relative %: 81 %
Platelets: 214 K/uL (ref 150–400)
RBC: 4.68 MIL/uL (ref 4.22–5.81)
RDW: 13.9 % (ref 11.5–15.5)
WBC: 7.4 K/uL (ref 4.0–10.5)
nRBC: 0 % (ref 0.0–0.2)

## 2024-03-11 LAB — PROTEIN / CREATININE RATIO, URINE
Creatinine, Urine: 105 mg/dL
Protein Creatinine Ratio: 0.08 mg/mg{creat} (ref 0.00–0.15)
Total Protein, Urine: 9 mg/dL

## 2024-03-11 LAB — MAGNESIUM: Magnesium: 1.8 mg/dL (ref 1.7–2.4)

## 2024-03-11 MED FILL — GABAPENTIN 100 MG CAPSULE: ORAL | 30 days supply | Qty: 60 | Fill #11

## 2024-03-12 LAB — TACROLIMUS LEVEL: Tacrolimus (FK506) - LabCorp: 7 ng/mL (ref 5.0–20.0)

## 2024-03-19 DIAGNOSIS — Z940 Kidney transplant status: Principal | ICD-10-CM

## 2024-03-23 MED FILL — LEVOTHYROXINE 75 MCG TABLET: ORAL | 90 days supply | Qty: 90 | Fill #1

## 2024-03-23 MED FILL — MYCOPHENOLATE SODIUM 180 MG TABLET,DELAYED RELEASE: ORAL | 30 days supply | Qty: 180 | Fill #2

## 2024-04-04 DIAGNOSIS — Z940 Kidney transplant status: Principal | ICD-10-CM

## 2024-04-12 MED ORDER — GABAPENTIN 100 MG CAPSULE
ORAL_CAPSULE | Freq: Two times a day (BID) | ORAL | 11 refills | 30.00000 days
Start: 2024-04-12 — End: 2025-04-12

## 2024-04-13 DIAGNOSIS — Z940 Kidney transplant status: Principal | ICD-10-CM

## 2024-04-13 MED ORDER — GABAPENTIN 100 MG CAPSULE
ORAL_CAPSULE | Freq: Two times a day (BID) | ORAL | 11 refills | 30.00000 days | Status: CP
Start: 2024-04-13 — End: 2025-04-13
  Filled 2024-04-15: qty 60, 30d supply, fill #0

## 2024-04-15 ENCOUNTER — Other Ambulatory Visit (HOSPITAL_COMMUNITY)
Admission: RE | Admit: 2024-04-15 | Discharge: 2024-04-15 | Disposition: A | Discharge status: Home / Self Care | Source: Ambulatory Visit | Attending: Family | Admitting: Family

## 2024-04-15 DIAGNOSIS — Z940 Kidney transplant status: Secondary | ICD-10-CM | POA: Insufficient documentation

## 2024-04-15 DIAGNOSIS — D8940 Mast cell activation, unspecified: Secondary | ICD-10-CM | POA: Diagnosis present

## 2024-04-15 LAB — PHOSPHORUS: Phosphorus: 3.3 mg/dL (ref 2.5–4.6)

## 2024-04-15 LAB — CBC WITH DIFFERENTIAL/PLATELET
Abs Immature Granulocytes: 0.05 K/uL (ref 0.00–0.07)
Basophils Absolute: 0 K/uL (ref 0.0–0.1)
Basophils Relative: 0 %
Eosinophils Absolute: 0.1 K/uL (ref 0.0–0.5)
Eosinophils Relative: 2 %
HCT: 36.2 % — ABNORMAL LOW (ref 39.0–52.0)
Hemoglobin: 10.7 g/dL — ABNORMAL LOW (ref 13.0–17.0)
Immature Granulocytes: 1 %
Lymphocytes Relative: 9 %
Lymphs Abs: 0.6 K/uL — ABNORMAL LOW (ref 0.7–4.0)
MCH: 24.8 pg — ABNORMAL LOW (ref 26.0–34.0)
MCHC: 29.6 g/dL — ABNORMAL LOW (ref 30.0–36.0)
MCV: 83.8 fL (ref 80.0–100.0)
Monocytes Absolute: 0.5 K/uL (ref 0.1–1.0)
Monocytes Relative: 7 %
Neutro Abs: 5.8 K/uL (ref 1.7–7.7)
Neutrophils Relative %: 81 %
Platelets: 148 K/uL — ABNORMAL LOW (ref 150–400)
RBC: 4.32 MIL/uL (ref 4.22–5.81)
RDW: 14.6 % (ref 11.5–15.5)
Smear Review: NORMAL
WBC: 7.1 K/uL (ref 4.0–10.5)
nRBC: 0 % (ref 0.0–0.2)

## 2024-04-15 LAB — BASIC METABOLIC PANEL WITH GFR
Anion gap: 10 (ref 5–15)
BUN: 40 mg/dL — ABNORMAL HIGH (ref 6–20)
CO2: 25 mmol/L (ref 22–32)
Calcium: 9.1 mg/dL (ref 8.9–10.3)
Chloride: 106 mmol/L (ref 98–111)
Creatinine, Ser: 2.29 mg/dL — ABNORMAL HIGH (ref 0.61–1.24)
GFR, Estimated: 32 mL/min — ABNORMAL LOW
Glucose, Bld: 116 mg/dL — ABNORMAL HIGH (ref 70–99)
Potassium: 4.4 mmol/L (ref 3.5–5.1)
Sodium: 141 mmol/L (ref 135–145)

## 2024-04-15 LAB — MAGNESIUM: Magnesium: 2 mg/dL (ref 1.7–2.4)

## 2024-04-15 MED FILL — ALLOPURINOL 100 MG TABLET: ORAL | 90 days supply | Qty: 45 | Fill #2

## 2024-04-15 MED FILL — MYCOPHENOLATE SODIUM 180 MG TABLET,DELAYED RELEASE: ORAL | 30 days supply | Qty: 180 | Fill #3

## 2024-04-16 DIAGNOSIS — Z940 Kidney transplant status: Principal | ICD-10-CM

## 2024-04-16 MED ORDER — ENVARSUS XR 1 MG TABLET,EXTENDED RELEASE
ORAL_TABLET | Freq: Every day | ORAL | 11 refills | 30.00000 days | Status: CP
Start: 2024-04-16 — End: 2025-04-16

## 2024-04-17 LAB — TACROLIMUS LEVEL: Tacrolimus (FK506) - LabCorp: 5.9 ng/mL (ref 5.0–20.0)

## 2024-04-21 DIAGNOSIS — Z940 Kidney transplant status: Principal | ICD-10-CM

## 2024-04-23 ENCOUNTER — Other Ambulatory Visit: Payer: Self-pay | Admitting: Family

## 2024-04-23 ENCOUNTER — Encounter: Admit: 2024-04-23 | Discharge: 2024-04-24 | Attending: Nephrology | Primary: Nephrology

## 2024-04-23 DIAGNOSIS — Z794 Long term (current) use of insulin: Secondary | ICD-10-CM

## 2024-04-23 DIAGNOSIS — I151 Hypertension secondary to other renal disorders: Secondary | ICD-10-CM

## 2024-04-23 DIAGNOSIS — Z940 Kidney transplant status: Principal | ICD-10-CM

## 2024-04-23 DIAGNOSIS — E1122 Type 2 diabetes mellitus with diabetic chronic kidney disease: Secondary | ICD-10-CM

## 2024-04-23 DIAGNOSIS — E1140 Type 2 diabetes mellitus with diabetic neuropathy, unspecified: Secondary | ICD-10-CM

## 2024-04-23 MED ORDER — INSULIN GLARGINE (U-100) 100 UNIT/ML (3 ML) SUBCUTANEOUS PEN
Freq: Every day | SUBCUTANEOUS | 11 refills | 30.00000 days | Status: CP
Start: 2024-04-23 — End: 2025-04-23

## 2024-04-23 MED ORDER — SPIRONOLACTONE 50 MG TABLET
ORAL_TABLET | Freq: Every day | ORAL | 3 refills | 90.00000 days | Status: CP
Start: 2024-04-23 — End: 2025-04-23
  Filled 2024-04-29: qty 90, 90d supply, fill #0

## 2024-04-23 MED ORDER — OZEMPIC 2 MG/DOSE (8 MG/3 ML) SUBCUTANEOUS PEN INJECTOR
SUBCUTANEOUS | 11 refills | 28.00000 days | Status: CP
Start: 2024-04-23 — End: 2025-04-23

## 2024-04-26 MED ORDER — CARVEDILOL 12.5 MG TABLET
ORAL_TABLET | Freq: Two times a day (BID) | ORAL | 11 refills | 30.00000 days | Status: CP
Start: 2024-04-26 — End: 2025-04-21
  Filled 2024-04-29: qty 540, 90d supply, fill #0

## 2024-04-26 MED ORDER — ATORVASTATIN 40 MG TABLET
ORAL_TABLET | Freq: Every day | ORAL | 3 refills | 90.00000 days | Status: CP
Start: 2024-04-26 — End: 2025-04-26
  Filled 2024-04-29: qty 90, 90d supply, fill #0

## 2024-04-28 ENCOUNTER — Ambulatory Visit: Payer: Self-pay | Admitting: Family

## 2024-04-28 DIAGNOSIS — Z940 Kidney transplant status: Principal | ICD-10-CM

## 2024-04-29 DIAGNOSIS — Z940 Kidney transplant status: Principal | ICD-10-CM

## 2024-05-02 DIAGNOSIS — Z940 Kidney transplant status: Principal | ICD-10-CM

## 2024-05-07 ENCOUNTER — Other Ambulatory Visit: Payer: Self-pay | Admitting: Family

## 2024-05-07 DIAGNOSIS — E1140 Type 2 diabetes mellitus with diabetic neuropathy, unspecified: Secondary | ICD-10-CM

## 2024-07-27 ENCOUNTER — Ambulatory Visit: Admitting: Dermatology

## 2024-07-30 ENCOUNTER — Encounter: Payer: Self-pay | Admitting: Family
